# Patient Record
Sex: Male | Born: 1940 | Race: White | Hispanic: No | Marital: Married | Smoking: Former smoker
Health system: Southern US, Community
[De-identification: ages and names within clinical notes are randomized; demographics above are authoritative.]

## PROBLEM LIST (undated history)

## (undated) ENCOUNTER — Emergency Department (HOSPITAL_COMMUNITY): Payer: Medicare Other

## (undated) DIAGNOSIS — D62 Acute posthemorrhagic anemia: Secondary | ICD-10-CM

## (undated) DIAGNOSIS — C3490 Malignant neoplasm of unspecified part of unspecified bronchus or lung: Secondary | ICD-10-CM

## (undated) DIAGNOSIS — N183 Chronic kidney disease, stage 3 unspecified: Secondary | ICD-10-CM

## (undated) DIAGNOSIS — E119 Type 2 diabetes mellitus without complications: Secondary | ICD-10-CM

## (undated) DIAGNOSIS — I499 Cardiac arrhythmia, unspecified: Secondary | ICD-10-CM

## (undated) DIAGNOSIS — E876 Hypokalemia: Secondary | ICD-10-CM

## (undated) DIAGNOSIS — J449 Chronic obstructive pulmonary disease, unspecified: Secondary | ICD-10-CM

## (undated) DIAGNOSIS — F419 Anxiety disorder, unspecified: Secondary | ICD-10-CM

## (undated) DIAGNOSIS — R338 Other retention of urine: Secondary | ICD-10-CM

## (undated) DIAGNOSIS — I4891 Unspecified atrial fibrillation: Secondary | ICD-10-CM

## (undated) DIAGNOSIS — R0600 Dyspnea, unspecified: Secondary | ICD-10-CM

## (undated) DIAGNOSIS — Z89432 Acquired absence of left foot: Secondary | ICD-10-CM

## (undated) DIAGNOSIS — M869 Osteomyelitis, unspecified: Secondary | ICD-10-CM

## (undated) DIAGNOSIS — R000 Tachycardia, unspecified: Secondary | ICD-10-CM

## (undated) DIAGNOSIS — Z8719 Personal history of other diseases of the digestive system: Secondary | ICD-10-CM

## (undated) DIAGNOSIS — I10 Essential (primary) hypertension: Secondary | ICD-10-CM

## (undated) DIAGNOSIS — Z87891 Personal history of nicotine dependence: Secondary | ICD-10-CM

## (undated) DIAGNOSIS — I509 Heart failure, unspecified: Secondary | ICD-10-CM

## (undated) DIAGNOSIS — I480 Paroxysmal atrial fibrillation: Secondary | ICD-10-CM

## (undated) DIAGNOSIS — I639 Cerebral infarction, unspecified: Secondary | ICD-10-CM

## (undated) DIAGNOSIS — N401 Enlarged prostate with lower urinary tract symptoms: Secondary | ICD-10-CM

## (undated) DIAGNOSIS — A419 Sepsis, unspecified organism: Secondary | ICD-10-CM

## (undated) DIAGNOSIS — S62109A Fracture of unspecified carpal bone, unspecified wrist, initial encounter for closed fracture: Secondary | ICD-10-CM

## (undated) DIAGNOSIS — D72829 Elevated white blood cell count, unspecified: Secondary | ICD-10-CM

## (undated) DIAGNOSIS — K429 Umbilical hernia without obstruction or gangrene: Secondary | ICD-10-CM

## (undated) HISTORY — DX: Unspecified atrial fibrillation: I48.91

## (undated) HISTORY — DX: Acquired absence of left foot: Z89.432

## (undated) HISTORY — DX: Cerebral infarction, unspecified: I63.9

## (undated) HISTORY — DX: Hypokalemia: E87.6

## (undated) HISTORY — DX: Paroxysmal atrial fibrillation: I48.0

## (undated) HISTORY — DX: Personal history of other diseases of the digestive system: Z87.19

## (undated) HISTORY — DX: Personal history of nicotine dependence: Z87.891

## (undated) HISTORY — DX: Chronic kidney disease, stage 3 (moderate): N18.3

## (undated) HISTORY — DX: Type 2 diabetes mellitus without complications: E11.9

## (undated) HISTORY — DX: Acute posthemorrhagic anemia: D62

## (undated) HISTORY — DX: Chronic kidney disease, stage 3 unspecified: N18.30

## (undated) HISTORY — DX: Other retention of urine: R33.8

## (undated) HISTORY — DX: Fracture of unspecified carpal bone, unspecified wrist, initial encounter for closed fracture: S62.109A

## (undated) HISTORY — DX: Sepsis, unspecified organism: A41.9

## (undated) HISTORY — DX: Tachycardia, unspecified: R00.0

## (undated) HISTORY — DX: Essential (primary) hypertension: I10

## (undated) HISTORY — DX: Benign prostatic hyperplasia with lower urinary tract symptoms: N40.1

## (undated) HISTORY — DX: Osteomyelitis, unspecified: M86.9

## (undated) HISTORY — DX: Elevated white blood cell count, unspecified: D72.829

---

## 1991-09-28 DIAGNOSIS — Z8719 Personal history of other diseases of the digestive system: Secondary | ICD-10-CM

## 1991-09-28 HISTORY — DX: Personal history of other diseases of the digestive system: Z87.19

## 2002-05-14 ENCOUNTER — Ambulatory Visit (HOSPITAL_COMMUNITY): Admission: RE | Admit: 2002-05-14 | Discharge: 2002-05-14 | Payer: Self-pay | Admitting: Gastroenterology

## 2004-07-07 ENCOUNTER — Ambulatory Visit (HOSPITAL_COMMUNITY): Admission: RE | Admit: 2004-07-07 | Discharge: 2004-07-07 | Payer: Self-pay | Admitting: Gastroenterology

## 2010-10-13 ENCOUNTER — Other Ambulatory Visit: Payer: Self-pay | Admitting: Gastroenterology

## 2011-11-09 DIAGNOSIS — E7800 Pure hypercholesterolemia, unspecified: Secondary | ICD-10-CM | POA: Diagnosis not present

## 2011-11-09 DIAGNOSIS — K5100 Ulcerative (chronic) pancolitis without complications: Secondary | ICD-10-CM | POA: Diagnosis not present

## 2011-11-09 DIAGNOSIS — F3289 Other specified depressive episodes: Secondary | ICD-10-CM | POA: Diagnosis not present

## 2011-11-09 DIAGNOSIS — E119 Type 2 diabetes mellitus without complications: Secondary | ICD-10-CM | POA: Diagnosis not present

## 2011-11-09 DIAGNOSIS — I10 Essential (primary) hypertension: Secondary | ICD-10-CM | POA: Diagnosis not present

## 2011-11-09 DIAGNOSIS — F329 Major depressive disorder, single episode, unspecified: Secondary | ICD-10-CM | POA: Diagnosis not present

## 2011-12-01 DIAGNOSIS — L570 Actinic keratosis: Secondary | ICD-10-CM | POA: Diagnosis not present

## 2012-05-08 DIAGNOSIS — F329 Major depressive disorder, single episode, unspecified: Secondary | ICD-10-CM | POA: Diagnosis not present

## 2012-05-08 DIAGNOSIS — IMO0001 Reserved for inherently not codable concepts without codable children: Secondary | ICD-10-CM | POA: Diagnosis not present

## 2012-05-08 DIAGNOSIS — Z23 Encounter for immunization: Secondary | ICD-10-CM | POA: Diagnosis not present

## 2012-05-08 DIAGNOSIS — F3289 Other specified depressive episodes: Secondary | ICD-10-CM | POA: Diagnosis not present

## 2012-05-08 DIAGNOSIS — I10 Essential (primary) hypertension: Secondary | ICD-10-CM | POA: Diagnosis not present

## 2012-05-08 DIAGNOSIS — E7800 Pure hypercholesterolemia, unspecified: Secondary | ICD-10-CM | POA: Diagnosis not present

## 2012-05-08 DIAGNOSIS — K5100 Ulcerative (chronic) pancolitis without complications: Secondary | ICD-10-CM | POA: Diagnosis not present

## 2012-07-17 DIAGNOSIS — M25529 Pain in unspecified elbow: Secondary | ICD-10-CM | POA: Diagnosis not present

## 2012-11-08 DIAGNOSIS — Z125 Encounter for screening for malignant neoplasm of prostate: Secondary | ICD-10-CM | POA: Diagnosis not present

## 2012-11-08 DIAGNOSIS — K5100 Ulcerative (chronic) pancolitis without complications: Secondary | ICD-10-CM | POA: Diagnosis not present

## 2012-11-08 DIAGNOSIS — E119 Type 2 diabetes mellitus without complications: Secondary | ICD-10-CM | POA: Diagnosis not present

## 2012-11-08 DIAGNOSIS — F329 Major depressive disorder, single episode, unspecified: Secondary | ICD-10-CM | POA: Diagnosis not present

## 2012-11-08 DIAGNOSIS — F3289 Other specified depressive episodes: Secondary | ICD-10-CM | POA: Diagnosis not present

## 2012-11-08 DIAGNOSIS — I10 Essential (primary) hypertension: Secondary | ICD-10-CM | POA: Diagnosis not present

## 2012-11-08 DIAGNOSIS — E7800 Pure hypercholesterolemia, unspecified: Secondary | ICD-10-CM | POA: Diagnosis not present

## 2012-12-19 ENCOUNTER — Emergency Department (INDEPENDENT_AMBULATORY_CARE_PROVIDER_SITE_OTHER): Payer: Medicare Other

## 2012-12-19 ENCOUNTER — Encounter: Payer: Self-pay | Admitting: Emergency Medicine

## 2012-12-19 ENCOUNTER — Ambulatory Visit (INDEPENDENT_AMBULATORY_CARE_PROVIDER_SITE_OTHER): Payer: Medicare Other | Admitting: Sports Medicine

## 2012-12-19 ENCOUNTER — Emergency Department (INDEPENDENT_AMBULATORY_CARE_PROVIDER_SITE_OTHER)
Admission: EM | Admit: 2012-12-19 | Discharge: 2012-12-19 | Disposition: A | Discharge status: Home / Self Care | Payer: Medicare Other | Source: Home / Self Care | Attending: Family Medicine | Admitting: Family Medicine

## 2012-12-19 DIAGNOSIS — S52599A Other fractures of lower end of unspecified radius, initial encounter for closed fracture: Secondary | ICD-10-CM

## 2012-12-19 DIAGNOSIS — S52502A Unspecified fracture of the lower end of left radius, initial encounter for closed fracture: Secondary | ICD-10-CM | POA: Insufficient documentation

## 2012-12-19 DIAGNOSIS — W19XXXA Unspecified fall, initial encounter: Secondary | ICD-10-CM

## 2012-12-19 HISTORY — DX: Essential (primary) hypertension: I10

## 2012-12-19 MED ORDER — HYDROCODONE-ACETAMINOPHEN 5-325 MG PO TABS: 1.0000 | ORAL_TABLET | Freq: Three times a day (TID) | ORAL | Status: DC | PRN

## 2012-12-19 NOTE — ED Notes (Signed)
Left hand injury from fall 4 nights ago, tripped on blanket fell on hand on hardwood floor, hurts with movement, red, swollen, bruised

## 2012-12-19 NOTE — Progress Notes (Signed)
  Subjective:    I'm seeing this patient as a consultation for:  Dr. Alvester Morin  CC: Left wrist pain  HPI: This pleasant 72 year old male had a fall 4 days ago, fell onto an outstretched left hand. Had immediate pain, swelling, bruising, but did not seek medical attention. The bruising and pain persisted and he presented to urgent care. He was localized over the dorsal distal radius, is severe, and worse with any movement. X-rays were done that showed an impacted distal radius fracture that is nondisplaced and really non-angulated. I was called for definitive management.  Past medical history, Surgical history, Family history not pertinant except as noted below, Social history, Allergies, and medications have been entered into the medical record, reviewed, and no changes needed.   Review of Systems: No headache, visual changes, nausea, vomiting, diarrhea, constipation, dizziness, abdominal pain, skin rash, fevers, chills, night sweats, weight loss, swollen lymph nodes, body aches, joint swelling, muscle aches, chest pain, shortness of breath, mood changes, visual or auditory hallucinations.   Objective:   General: Well Developed, well nourished, and in no acute distress.  Neuro/Psych: Alert and oriented x3, extra-ocular muscles intact, able to move all 4 extremities, sensation grossly intact. Skin: Warm and dry, no rashes noted.  Respiratory: Not using accessory muscles, speaking in full sentences, trachea midline.  Cardiovascular: Pulses palpable, no extremity edema. Abdomen: Does not appear distended. Left wrist is swollen, minimally bruised, tender to palpation of the dorsal distal radius. He has good movement of his fingers and good grip as well as neurovascularly intact distally.  X-rays were reviewed and show an impacted distal radius fracture with adequate radial height, inclination, and angulation.  Sugar tong splint placed.  Impression and Recommendations:   This case required medical  decision making of moderate complexity.

## 2012-12-19 NOTE — Assessment & Plan Note (Addendum)
Sugar tong splint. Hydrocodone. Return in one week for cast.  I billed a fracture code for this visit, all subsequent visits for this complaint will be "post-op checks" in the global period.

## 2012-12-19 NOTE — ED Provider Notes (Signed)
History     CSN: [id number]  Arrival date & time 12/19/12  1331   None     Chief Complaint  Patient presents with  . Hand Injury   HPI  L hand injury 4 days ago  Pt was in a hotel, accidental fell, landing on L wrist.  Has had L wrist pain and swelling since this point.  No numbness.  Decreased ROM 2/2 pain.   No past medical history on file.  No past surgical history on file.  No family history on file.  History  Substance Use Topics  . Smoking status: Not on file  . Smokeless tobacco: Not on file  . Alcohol Use: Not on file      Review of Systems  All other systems reviewed and are negative.    Allergies  Review of patient's allergies indicates no known allergies.  Home Medications  No current outpatient prescriptions on file.  BP 182/96  Pulse 76  Temp(Src) 98 F (36.7 C) (Oral)  Ht 6\' 1"  (1.854 m)  Wt 247 lb (112.038 kg)  BMI 32.59 kg/m2  SpO2 94%  Physical Exam  Constitutional: He appears well-developed and well-nourished.  HENT:  Head: Normocephalic and atraumatic.  Eyes: Pupils are equal, round, and reactive to light.  Neck: Normal range of motion.  Cardiovascular: Normal rate and regular rhythm.   Pulmonary/Chest: Effort normal.  Abdominal: Soft.  Musculoskeletal:       Hands: + TTP and swelling diffusely  General decreased ROM 2/2 pain  Neurovascularly intact distally   Neurological: He is alert.  Skin: Skin is warm.    ED Course  Procedures (including critical care time)  Labs Reviewed - No data to display Dg Wrist Complete Left  12/19/2012  *RADIOLOGY REPORT*  Clinical Data: Post fall  LEFT WRIST - COMPLETE 3+ VIEW  Comparison: None.  Findings: Four views of the left wrist submitted.  There is mild displaced impacted fracture of distal left radius.  Fracture line is seen progressing to distal articular surface of the radius.  IMPRESSION: Mild displaced impacted fracture of distal left radius.   Original Report Authenticated By:  Natasha Mead, M.D.    Dg Hand Complete Left  12/19/2012  *RADIOLOGY REPORT*  Clinical Data: Hand injury post fall  LEFT HAND - COMPLETE 3+ VIEW  Comparison: None.  Findings: Three views of the left hand submitted.  No hand fracture.  There is impacted mild displaced fracture of distal left radius.  IMPRESSION: No hand fracture.  Impacted mild displaced fracture distal left radius.   Original Report Authenticated By: Natasha Mead, M.D.      1. Distal radius fracture, left, closed, initial encounter       MDM  Impacted distal radius fracture.  Will need sugartong splint and sports medicine follow up.  Sports medicine consulted as they are available.  Treatment and follow up per sports medicine.     The patient and/or caregiver has been counseled thoroughly with regard to treatment plan and/or medications prescribed including dosage, schedule, interactions, rationale for use, and possible side effects and they verbalize understanding. Diagnoses and expected course of recovery discussed and will return if not improved as expected or if the condition worsens. Patient and/or caregiver verbalized understanding.             Doree Albee, MD 12/19/12 [phone]

## 2012-12-26 ENCOUNTER — Encounter: Payer: Self-pay | Admitting: Sports Medicine

## 2012-12-26 ENCOUNTER — Ambulatory Visit (INDEPENDENT_AMBULATORY_CARE_PROVIDER_SITE_OTHER): Payer: Medicare Other

## 2012-12-26 ENCOUNTER — Ambulatory Visit (INDEPENDENT_AMBULATORY_CARE_PROVIDER_SITE_OTHER): Payer: Medicare Other | Admitting: Sports Medicine

## 2012-12-26 ENCOUNTER — Other Ambulatory Visit: Payer: Self-pay | Admitting: Sports Medicine

## 2012-12-26 ENCOUNTER — Ambulatory Visit (HOSPITAL_COMMUNITY): Admission: RE | Admit: 2012-12-26 | Payer: Medicare Other | Source: Ambulatory Visit

## 2012-12-26 VITALS — BP 156/87 | HR 65

## 2012-12-26 DIAGNOSIS — S52502D Unspecified fracture of the lower end of left radius, subsequent encounter for closed fracture with routine healing: Secondary | ICD-10-CM

## 2012-12-26 DIAGNOSIS — S52599A Other fractures of lower end of unspecified radius, initial encounter for closed fracture: Secondary | ICD-10-CM

## 2012-12-26 DIAGNOSIS — S5290XD Unspecified fracture of unspecified forearm, subsequent encounter for closed fracture with routine healing: Secondary | ICD-10-CM

## 2012-12-26 NOTE — Assessment & Plan Note (Signed)
On the current view, there is about 2 mm of joint line incongruity. We did discuss the risk of osteoarthritis of the wrist down the road. Maysin desires not to proceed with any form of operative intervention. Short arm cast placed. He will return in 2 weeks, x-ray before the visit.

## 2012-12-26 NOTE — Progress Notes (Signed)
  Subjective: Ison is now 9 days status post distal radius fracture, he did not seek medical care until 5 days ago. The fracture was mildly comminuted and intra-articular. I placed a sugar tong splint at the last visit and he returns today pain-free.   Objective: General: Well-developed, well-nourished, and in no acute distress. Swelling is significantly resolved, there is still mild tenderness to palpation over the distal radius. He is neurovascularly intact distally.  X-rays were reviewed and continued to show intra-articular distal radius fracture with approximately 2 mm of joint incongruity.  Short arm cast placed. Assessment/plan:

## 2012-12-27 ENCOUNTER — Telehealth: Payer: Self-pay | Admitting: Sports Medicine

## 2012-12-27 DIAGNOSIS — S52502D Unspecified fracture of the lower end of left radius, subsequent encounter for closed fracture with routine healing: Secondary | ICD-10-CM

## 2012-12-27 NOTE — Telephone Encounter (Signed)
Patient called set up an appointment for 01/09/13 for f/up on fractured wrist and req to have x-ray order sent downstairs.Marland KitchenMarland KitchenMarland KitchenThanks

## 2012-12-27 NOTE — Telephone Encounter (Signed)
Orders placed.

## 2013-01-09 ENCOUNTER — Ambulatory Visit (INDEPENDENT_AMBULATORY_CARE_PROVIDER_SITE_OTHER): Payer: Medicare Other

## 2013-01-09 ENCOUNTER — Encounter: Payer: Self-pay | Admitting: Sports Medicine

## 2013-01-09 ENCOUNTER — Ambulatory Visit (INDEPENDENT_AMBULATORY_CARE_PROVIDER_SITE_OTHER): Payer: Medicare Other | Admitting: Sports Medicine

## 2013-01-09 VITALS — BP 136/79 | HR 67

## 2013-01-09 DIAGNOSIS — S52502D Unspecified fracture of the lower end of left radius, subsequent encounter for closed fracture with routine healing: Secondary | ICD-10-CM

## 2013-01-09 DIAGNOSIS — S52599A Other fractures of lower end of unspecified radius, initial encounter for closed fracture: Secondary | ICD-10-CM

## 2013-01-09 DIAGNOSIS — IMO0001 Reserved for inherently not codable concepts without codable children: Secondary | ICD-10-CM

## 2013-01-09 DIAGNOSIS — S5290XA Unspecified fracture of unspecified forearm, initial encounter for closed fracture: Secondary | ICD-10-CM | POA: Diagnosis not present

## 2013-01-09 NOTE — Assessment & Plan Note (Signed)
Short arm cast replaced. Return in 3 weeks. No x-rays needed.

## 2013-01-09 NOTE — Progress Notes (Signed)
  Subjective: Kevin [name] is now 3 and half weeks status post fracture of the distal radius with intra-articular extension. He is essentially pain free.   Objective: General: Well-developed, well-nourished, and in no acute distress. Cast is loose and was removed. Is very little if any tenderness to palpation over the fracture site.  Short arm cast replaced.  X-rays were reviewed and show stable alignment of the distal radius intra-articular fracture. Assessment/plan:

## 2013-01-30 ENCOUNTER — Ambulatory Visit: Payer: Medicare Other | Admitting: Sports Medicine

## 2013-01-30 ENCOUNTER — Encounter: Payer: Self-pay | Admitting: Sports Medicine

## 2013-01-30 VITALS — BP 129/71 | HR 60

## 2013-01-30 DIAGNOSIS — S52502D Unspecified fracture of the lower end of left radius, subsequent encounter for closed fracture with routine healing: Secondary | ICD-10-CM

## 2013-01-30 NOTE — Assessment & Plan Note (Signed)
Velcro wrist brace. Home exercises to increase range of motion. Return in 4 weeks to recheck.

## 2013-01-30 NOTE — Progress Notes (Signed)
  Subjective: This pleasant 72 year old male is 6 weeks status post fracture of the left distal radius that extended into the joint space. He is pain-free.   Objective: General: Well-developed, well-nourished, and in no acute distress.  His cast is removed. There is no tenderness to palpation over the fracture site. He still has a little bit of pain with extreme supination. Otherwise range of motion is somewhat limited as expected.   Assessment/plan:

## 2013-02-01 DIAGNOSIS — D235 Other benign neoplasm of skin of trunk: Secondary | ICD-10-CM | POA: Diagnosis not present

## 2013-02-01 DIAGNOSIS — Z85828 Personal history of other malignant neoplasm of skin: Secondary | ICD-10-CM | POA: Diagnosis not present

## 2013-03-06 ENCOUNTER — Ambulatory Visit: Payer: Medicare Other | Admitting: Sports Medicine

## 2013-03-06 ENCOUNTER — Encounter: Payer: Self-pay | Admitting: Sports Medicine

## 2013-03-06 VITALS — BP 123/70 | HR 71

## 2013-03-06 DIAGNOSIS — S52502S Unspecified fracture of the lower end of left radius, sequela: Secondary | ICD-10-CM

## 2013-03-06 NOTE — Assessment & Plan Note (Signed)
Saksham is now approximately 11 weeks status post a left distal radius fracture that extended into the joint. He continues to improve every day, and continue to work on strength with exercises. 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 I've advised him to continue the exercises, continue the wrist brace as needed. If still painful and 4-6 weeks I can certainly perform an intra-articular injection.  I will see him back in about 6 weeks.

## 2013-03-06 NOTE — Progress Notes (Signed)
  Subjective: Kevin [name] is now approximately 11 weeks status post a left distal radius fracture that extended into the joint. He continues to improve every day, and continue to work on strength with exercises. 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   Objective: General: Well-developed, well-nourished, and in no acute distress. Wrist is still somewhat swollen, with mild tenderness to palpation just distal to Lister's tubercle. He has reproduction of pain passive wrist extension. Otherwise good strength and range of motion.  Assessment/plan:

## 2013-04-17 ENCOUNTER — Ambulatory Visit: Payer: Medicare Other | Admitting: Sports Medicine

## 2013-05-09 DIAGNOSIS — F3289 Other specified depressive episodes: Secondary | ICD-10-CM | POA: Diagnosis not present

## 2013-05-09 DIAGNOSIS — E119 Type 2 diabetes mellitus without complications: Secondary | ICD-10-CM | POA: Diagnosis not present

## 2013-05-09 DIAGNOSIS — I10 Essential (primary) hypertension: Secondary | ICD-10-CM | POA: Diagnosis not present

## 2013-05-09 DIAGNOSIS — K5100 Ulcerative (chronic) pancolitis without complications: Secondary | ICD-10-CM | POA: Diagnosis not present

## 2013-05-09 DIAGNOSIS — E7800 Pure hypercholesterolemia, unspecified: Secondary | ICD-10-CM | POA: Diagnosis not present

## 2013-05-09 DIAGNOSIS — F329 Major depressive disorder, single episode, unspecified: Secondary | ICD-10-CM | POA: Diagnosis not present

## 2013-06-04 DIAGNOSIS — K5100 Ulcerative (chronic) pancolitis without complications: Secondary | ICD-10-CM | POA: Diagnosis not present

## 2013-11-09 DIAGNOSIS — E7800 Pure hypercholesterolemia, unspecified: Secondary | ICD-10-CM | POA: Diagnosis not present

## 2013-11-09 DIAGNOSIS — F329 Major depressive disorder, single episode, unspecified: Secondary | ICD-10-CM | POA: Diagnosis not present

## 2013-11-09 DIAGNOSIS — E119 Type 2 diabetes mellitus without complications: Secondary | ICD-10-CM | POA: Diagnosis not present

## 2013-11-09 DIAGNOSIS — I10 Essential (primary) hypertension: Secondary | ICD-10-CM | POA: Diagnosis not present

## 2013-11-09 DIAGNOSIS — Z1331 Encounter for screening for depression: Secondary | ICD-10-CM | POA: Diagnosis not present

## 2013-11-09 DIAGNOSIS — K5100 Ulcerative (chronic) pancolitis without complications: Secondary | ICD-10-CM | POA: Diagnosis not present

## 2013-11-29 ENCOUNTER — Other Ambulatory Visit: Payer: Self-pay | Admitting: Gastroenterology

## 2013-11-29 DIAGNOSIS — Z8719 Personal history of other diseases of the digestive system: Secondary | ICD-10-CM | POA: Diagnosis not present

## 2013-11-29 DIAGNOSIS — Z09 Encounter for follow-up examination after completed treatment for conditions other than malignant neoplasm: Secondary | ICD-10-CM | POA: Diagnosis not present

## 2013-11-29 DIAGNOSIS — K5190 Ulcerative colitis, unspecified, without complications: Secondary | ICD-10-CM | POA: Diagnosis not present

## 2013-11-29 DIAGNOSIS — D126 Benign neoplasm of colon, unspecified: Secondary | ICD-10-CM | POA: Diagnosis not present

## 2014-05-23 DIAGNOSIS — E119 Type 2 diabetes mellitus without complications: Secondary | ICD-10-CM | POA: Diagnosis not present

## 2014-05-23 DIAGNOSIS — F329 Major depressive disorder, single episode, unspecified: Secondary | ICD-10-CM | POA: Diagnosis not present

## 2014-05-23 DIAGNOSIS — I10 Essential (primary) hypertension: Secondary | ICD-10-CM | POA: Diagnosis not present

## 2014-05-23 DIAGNOSIS — K5100 Ulcerative (chronic) pancolitis without complications: Secondary | ICD-10-CM | POA: Diagnosis not present

## 2014-05-23 DIAGNOSIS — Z0184 Encounter for antibody response examination: Secondary | ICD-10-CM | POA: Diagnosis not present

## 2014-05-23 DIAGNOSIS — E7800 Pure hypercholesterolemia, unspecified: Secondary | ICD-10-CM | POA: Diagnosis not present

## 2014-10-20 ENCOUNTER — Emergency Department (INDEPENDENT_AMBULATORY_CARE_PROVIDER_SITE_OTHER): Payer: Medicare Other

## 2014-10-20 ENCOUNTER — Emergency Department (INDEPENDENT_AMBULATORY_CARE_PROVIDER_SITE_OTHER)
Admission: EM | Admit: 2014-10-20 | Discharge: 2014-10-20 | Disposition: A | Discharge status: Home / Self Care | Payer: Medicare Other | Source: Home / Self Care | Attending: Emergency Medicine | Admitting: Emergency Medicine

## 2014-10-20 DIAGNOSIS — R059 Cough, unspecified: Secondary | ICD-10-CM

## 2014-10-20 DIAGNOSIS — R05 Cough: Secondary | ICD-10-CM

## 2014-10-20 DIAGNOSIS — R0602 Shortness of breath: Secondary | ICD-10-CM | POA: Diagnosis not present

## 2014-10-20 DIAGNOSIS — J208 Acute bronchitis due to other specified organisms: Secondary | ICD-10-CM | POA: Diagnosis not present

## 2014-10-20 MED ORDER — AZITHROMYCIN 250 MG PO TABS: 250.0000 mg | ORAL_TABLET | Freq: Every day | ORAL | Status: DC

## 2014-10-20 MED ORDER — ALBUTEROL SULFATE HFA 108 (90 BASE) MCG/ACT IN AERS: 1.0000 | INHALATION_SPRAY | Freq: Four times a day (QID) | RESPIRATORY_TRACT | Status: DC | PRN

## 2014-10-20 NOTE — ED Provider Notes (Signed)
CSN: [id number]     Arrival date & time 10/20/14  1424 History   First MD Initiated Contact with Patient 10/20/14 1440     Chief Complaint  Patient presents with  . Cough   (Consider location/radiation/quality/duration/timing/severity/associated sxs/prior Treatment) Patient is a 74 y.o. male presenting with cough. The history is provided by the patient. No language interpreter was used.  Cough Cough characteristics:  Productive Sputum characteristics:  Nondescript Severity:  Moderate Onset quality:  Gradual Duration:  1 week Timing:  Constant Progression:  Worsening Chronicity:  New Smoker: no   Context: not animal exposure and not sick contacts   Relieved by:  Nothing Worsened by:  Nothing tried Ineffective treatments:  None tried Associated symptoms: shortness of breath   Associated symptoms: no fever     Past Medical History  Diagnosis Date  . Diabetes mellitus without complication   . Hypertension    No past surgical history on file. No family history on file. History  Substance Use Topics  . Smoking status: Never Smoker   . Smokeless tobacco: Not on file  . Alcohol Use: Yes    Review of Systems  Constitutional: Negative for fever.  Respiratory: Positive for cough and shortness of breath.   All other systems reviewed and are negative.   Allergies  Review of patient's allergies indicates no known allergies.  Home Medications   Prior to Admission medications   Medication Sig Start Date End Date Taking? Authorizing Provider  FLUOXETINE HCL PO Take by mouth.   Yes Historical Provider, MD  SitaGLIPtin Phosphate (JANUVIA PO) Take by mouth.   Yes Historical Provider, MD  metFORMIN (GLUCOPHAGE) 500 MG tablet Take 500 mg by mouth 2 (two) times daily with a meal.    Historical Provider, MD  metoprolol succinate (TOPROL-XL) 100 MG 24 hr tablet Take 100 mg by mouth daily. Take with or immediately following a meal.    Historical Provider, MD  olsalazine (DIPENTUM) 250  MG capsule Take 500 mg by mouth 2 (two) times daily.    Historical Provider, MD  rosuvastatin (CRESTOR) 10 MG tablet Take 10 mg by mouth daily.    Historical Provider, MD   BP 149/84 mmHg  Pulse 76  Temp(Src) 98.4 F (36.9 C) (Oral)  Ht 6\' 1"  (1.854 m)  Wt 240 lb (108.863 kg)  BMI 31.67 kg/m2  SpO2 92% Physical Exam  Constitutional: He is oriented to person, place, and time. He appears well-developed and well-nourished.  HENT:  Head: Normocephalic and atraumatic.  Right Ear: External ear normal.  Nose: Nose normal.  Mouth/Throat: Oropharynx is clear and moist.  Eyes: EOM are normal. Pupils are equal, round, and reactive to light.  Neck: Normal range of motion.  Cardiovascular: Normal rate.   Pulmonary/Chest: Effort normal and breath sounds normal.  Abdominal: Soft. He exhibits no distension.  Musculoskeletal: Normal range of motion.  Neurological: He is alert and oriented to person, place, and time.  Skin: Skin is warm.  Psychiatric: He has a normal mood and affect.  Nursing note and vitals reviewed.   ED Course  Procedures (including critical care time) Labs Review Labs Reviewed - No data to display  Imaging Review Dg Chest 2 View  10/20/2014   CLINICAL DATA:  Two weeks of cough and shortness of breath. Nonsmoker.  EXAM: CHEST  2 VIEW  COMPARISON:  None  FINDINGS: Cardiac silhouette normal in size and configuration. Aorta is mildly uncoiled. No mediastinal or hilar masses or convincing adenopathy.  Lungs are mildly  hyperexpanded. No lung consolidation or edema. No pleural effusion or pneumothorax.  Bony thorax is intact.  IMPRESSION: No acute cardiopulmonary disease.   Electronically Signed   By: Lajean Manes M.D.   On: 10/20/2014 15:12     MDM   1. Acute bronchitis due to other specified organisms   2. Cough     Zithromax Albuterol inhaler See your Physician for recheck in 3-4 days AVS   Fransico Meadow, PA-C 10/20/14 1522

## 2014-10-20 NOTE — Discharge Instructions (Signed)

## 2014-10-20 NOTE — ED Notes (Signed)
Patient has cough and cold sx, sore throat, congestion and shortness of breath. Patient has used Mucinex OTC with some relief.

## 2014-10-22 ENCOUNTER — Telehealth: Payer: Self-pay | Admitting: Emergency Medicine

## 2014-11-28 DIAGNOSIS — I10 Essential (primary) hypertension: Secondary | ICD-10-CM | POA: Diagnosis not present

## 2014-11-28 DIAGNOSIS — K5190 Ulcerative colitis, unspecified, without complications: Secondary | ICD-10-CM | POA: Diagnosis not present

## 2014-11-28 DIAGNOSIS — E780 Pure hypercholesterolemia: Secondary | ICD-10-CM | POA: Diagnosis not present

## 2014-11-28 DIAGNOSIS — E119 Type 2 diabetes mellitus without complications: Secondary | ICD-10-CM | POA: Diagnosis not present

## 2014-11-28 DIAGNOSIS — Z1389 Encounter for screening for other disorder: Secondary | ICD-10-CM | POA: Diagnosis not present

## 2014-11-28 DIAGNOSIS — F322 Major depressive disorder, single episode, severe without psychotic features: Secondary | ICD-10-CM | POA: Diagnosis not present

## 2014-11-28 DIAGNOSIS — Z23 Encounter for immunization: Secondary | ICD-10-CM | POA: Diagnosis not present

## 2015-03-06 DIAGNOSIS — E119 Type 2 diabetes mellitus without complications: Secondary | ICD-10-CM | POA: Diagnosis not present

## 2015-06-12 DIAGNOSIS — E780 Pure hypercholesterolemia: Secondary | ICD-10-CM | POA: Diagnosis not present

## 2015-06-12 DIAGNOSIS — Z23 Encounter for immunization: Secondary | ICD-10-CM | POA: Diagnosis not present

## 2015-06-12 DIAGNOSIS — I10 Essential (primary) hypertension: Secondary | ICD-10-CM | POA: Diagnosis not present

## 2015-06-12 DIAGNOSIS — F322 Major depressive disorder, single episode, severe without psychotic features: Secondary | ICD-10-CM | POA: Diagnosis not present

## 2015-06-12 DIAGNOSIS — E119 Type 2 diabetes mellitus without complications: Secondary | ICD-10-CM | POA: Diagnosis not present

## 2015-06-12 DIAGNOSIS — K5190 Ulcerative colitis, unspecified, without complications: Secondary | ICD-10-CM | POA: Diagnosis not present

## 2015-11-18 ENCOUNTER — Encounter: Payer: Self-pay | Admitting: *Deleted

## 2015-11-18 ENCOUNTER — Emergency Department (INDEPENDENT_AMBULATORY_CARE_PROVIDER_SITE_OTHER)
Admission: EM | Admit: 2015-11-18 | Discharge: 2015-11-18 | Disposition: A | Discharge status: Home / Self Care | Payer: Medicare Other | Source: Home / Self Care | Attending: Family Medicine | Admitting: Family Medicine

## 2015-11-18 ENCOUNTER — Emergency Department (INDEPENDENT_AMBULATORY_CARE_PROVIDER_SITE_OTHER): Payer: Medicare Other

## 2015-11-18 ENCOUNTER — Institutional Professional Consult (permissible substitution): Payer: Medicare Other | Admitting: Sports Medicine

## 2015-11-18 DIAGNOSIS — M25572 Pain in left ankle and joints of left foot: Secondary | ICD-10-CM | POA: Diagnosis not present

## 2015-11-18 DIAGNOSIS — M25475 Effusion, left foot: Secondary | ICD-10-CM

## 2015-11-18 DIAGNOSIS — M7989 Other specified soft tissue disorders: Secondary | ICD-10-CM | POA: Diagnosis not present

## 2015-11-18 DIAGNOSIS — M19072 Primary osteoarthritis, left ankle and foot: Secondary | ICD-10-CM

## 2015-11-18 DIAGNOSIS — M79672 Pain in left foot: Secondary | ICD-10-CM

## 2015-11-18 NOTE — Consult Note (Signed)
   Subjective:    I'm seeing this patient as a consultation for:  Noland Fordyce PA-C  CC: Right foot swelling  HPI: For the past couple of weeks this pleasant 75 year old male has had increasing swelling over the dorsal medial midfoot, with minimal pain, no redness, no constitutional symptoms and no history of trauma. Symptoms are mild, persistent.  Past medical history, Surgical history, Family history not pertinant except as noted below, Social history, Allergies, and medications have been entered into the medical record, reviewed, and no changes needed.   Review of Systems: No headache, visual changes, nausea, vomiting, diarrhea, constipation, dizziness, abdominal pain, skin rash, fevers, chills, night sweats, weight loss, swollen lymph nodes, body aches, joint swelling, muscle aches, chest pain, shortness of breath, mood changes, visual or auditory hallucinations.   Objective:   General: Well Developed, well nourished, and in no acute distress.  Neuro/Psych: Alert and oriented x3, extra-ocular muscles intact, able to move all 4 extremities, sensation grossly intact. Skin: Warm and dry, no rashes noted.  Respiratory: Not using accessory muscles, speaking in full sentences, trachea midline.  Cardiovascular: Pulses palpable, no extremity edema. Abdomen: Does not appear distended. Left foot: Visibly swollen with tenderness over the tarsometatarsal joint of the first ray. No erythema, induration, no warmth, good dorsalis pedis and posterior tibial pulses as well as good sensation.  X-rays reviewed and shows severe ankle arthritis, severe tarsometatarsal arthritis of the first ray, as well as evidence of an old fracture of the fifth metatarsal.  Procedure: Real-time Ultrasound Guided Injection of left first metatarsal/medial cuneiform joint Device: GE Logiq E  Verbal informed consent obtained.  Time-out conducted.  Noted no overlying erythema, induration, or other signs of local infection.   Skin prepped in a sterile fashion.  Local anesthesia: Topical Ethyl chloride.  With sterile technique and under real time ultrasound guidance:  Noted effusion, mild, as well as synovitis, 22-gauge needle advanced into this joint and 1 mL lidocaine, 1 mL kenalog 40 injected easily. Completed without difficulty  Pain immediately resolved suggesting accurate placement of the medication.  Advised to call if fevers/chills, erythema, induration, drainage, or persistent bleeding.  Images permanently stored and available for review in the ultrasound unit.  Impression: Technically successful ultrasound guided injection.  Impression and Recommendations:   This case required medical decision making of moderate complexity.  1. Primary foot osteoarthritis: Injection as above, return to see me for custom orthotics. ___________________________________________ Gwen Her. Dianah Field, M.D., ABFM., CAQSM. Primary Care and [place] In[employer] of [employer]

## 2015-11-18 NOTE — ED Notes (Signed)
Pt c/o left foot pain and swelling without injury x 1 week. No previous injuries. Walks on a treadmill 5 days/week.

## 2015-11-18 NOTE — ED Provider Notes (Signed)
CSN: [id number]     Arrival date & time 11/18/15  1404 History   First MD Initiated Contact with Patient 11/18/15 1419     Chief Complaint  Patient presents with  . Foot Pain   (Consider location/radiation/quality/duration/timing/severity/associated sxs/prior Treatment) HPI The pt is a 75yo male presenting to [facility] with c/o gradually worsening Left foot pain and swelling that started about 1 week ago. No known injury.  He states he does walk on a treadmill 5 times a week for about 35 minutes at a time.  He took a day other this weekend and when he tried to walk the next day he had to stop sooner than usual as the pain was significantly worse by the time he was doing his cool down part of the workout.  He also reports wearing shoes the other day that may have been pushing on the top of his foot but cannot think of anything else that may have cause his pain. Denies calf pain or swelling. Denies hx of gout.   Past Medical History  Diagnosis Date  . Diabetes mellitus without complication ([place])   . Hypertension    Past Surgical History  Procedure Laterality Date  . Wrist surgery     History reviewed. No pertinent family history. Social History  Substance Use Topics  . Smoking status: Never Smoker   . Smokeless tobacco: None  . Alcohol Use: Yes    Review of Systems  Musculoskeletal: Positive for myalgias, joint swelling and arthralgias.       Left foot  Skin: Negative for color change and wound.  Neurological: Negative for weakness and numbness.    Allergies  Review of patient's allergies indicates no known allergies.  Home Medications   Prior to Admission medications   Medication Sig Start Date End Date Taking? Authorizing Provider  FLUOXETINE HCL PO Take by mouth.   Yes Historical Provider, MD  mesalamine (APRISO) 0.375 g 24 hr capsule Take 375 mg by mouth daily.   Yes Historical Provider, MD  metoprolol succinate (TOPROL-XL) 100 MG 24 hr tablet Take 100 mg by mouth daily. Take  with or immediately following a meal.   Yes Historical Provider, MD  SitaGLIPtin Phosphate (JANUVIA PO) Take by mouth.   Yes Historical Provider, MD  metFORMIN (GLUCOPHAGE) 500 MG tablet Take 500 mg by mouth 2 (two) times daily with a meal.    Historical Provider, MD  olsalazine (DIPENTUM) 250 MG capsule Take 500 mg by mouth 2 (two) times daily.    Historical Provider, MD  rosuvastatin (CRESTOR) 10 MG tablet Take 10 mg by mouth daily.    Historical Provider, MD   Meds Ordered and Administered this Visit  Medications - No data to display  BP 132/74 mmHg  Pulse 66  Resp 14  Wt 237 lb (107.502 kg)  SpO2 95% No data found.   Physical Exam  Constitutional: He is oriented to person, place, and time. He appears well-developed and well-nourished.  HENT:  Head: Normocephalic and atraumatic.  Eyes: EOM are normal.  Neck: Normal range of motion.  Cardiovascular: Normal rate.   Pulses:      Dorsalis pedis pulses are 2+ on the left side.  Pulmonary/Chest: Effort normal.  Musculoskeletal: Normal range of motion. He exhibits edema and tenderness.  Left foot: moderate edema, worse on medial dorsal aspect of foot with tenderness. Full ROM ankle and toes.  Calf- soft, non-tender.  Neurological: He is alert and oriented to person, place, and time.  Left foot:  normal sensation  Skin: Skin is warm and dry. No rash noted. No erythema.  Left foot: skin in tact, no ecchymosis or erythema.   Psychiatric: He has a normal mood and affect. His behavior is normal.  Nursing note and vitals reviewed.   ED Course  Procedures (including critical care time)  Labs Review Labs Reviewed - No data to display  Imaging Review Dg Foot Complete Left  11/18/2015  CLINICAL DATA:  Left medial foot pain with redness for 6 days EXAM: LEFT FOOT - COMPLETE 3+ VIEW COMPARISON:  None. FINDINGS: Deformity fifth metatarsal suggests prior fracture. Moderate soft tissue swelling along the medial midfoot. There is arthritic  change at the first tarsometatarsal joint with osteophyte formation. No evidence of cortical destruction or periosteal reaction. IMPRESSION: Chronic degenerative changes. Nonspecific soft tissue swelling over the medial midfoot. Etiology uncertain with possible causes including cellulitis. Electronically Signed   By: Skipper Cliche M.D.   On: 11/18/2015 14:46     MDM   1. Left foot pain   2. Swelling of foot joint, left   3. Primary osteoarthritis of left foot     Pt c/o Left foot pain and swelling for about 1 week. No known injury. No evidence of underlying infection. Denies hx of gout. No hx of blood clots. Calf is soft, non-tender.  Plain films: chronic degenerative changes. Non-specific soft tissue swelling over medial midfoot. Etiology uncertain with possible causes including cellulitis.  Consulted with Dr. Dianah Field, Sports Medicine, who also examined and ultimately treated pt.  See consult note.       Noland Fordyce, PA-C 11/18/15 1552

## 2015-11-21 ENCOUNTER — Encounter: Payer: Self-pay | Admitting: Sports Medicine

## 2015-11-21 ENCOUNTER — Ambulatory Visit (INDEPENDENT_AMBULATORY_CARE_PROVIDER_SITE_OTHER): Payer: Medicare Other | Admitting: Sports Medicine

## 2015-11-21 VITALS — BP 150/77 | HR 70 | Resp 16 | Wt 236.2 lb

## 2015-11-21 DIAGNOSIS — M19072 Primary osteoarthritis, left ankle and foot: Secondary | ICD-10-CM

## 2015-11-21 NOTE — Assessment & Plan Note (Signed)
Intertarsal injection approximately 3 days ago with fantastic resolution of pain and swelling. 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 Custom orthotics as above, we had great difficulty molding the midfoot arch on the left foot. Return in one month.

## 2015-11-21 NOTE — Progress Notes (Signed)

## 2015-12-10 DIAGNOSIS — Z7984 Long term (current) use of oral hypoglycemic drugs: Secondary | ICD-10-CM | POA: Diagnosis not present

## 2015-12-10 DIAGNOSIS — E119 Type 2 diabetes mellitus without complications: Secondary | ICD-10-CM | POA: Diagnosis not present

## 2015-12-10 DIAGNOSIS — I10 Essential (primary) hypertension: Secondary | ICD-10-CM | POA: Diagnosis not present

## 2015-12-10 DIAGNOSIS — K5190 Ulcerative colitis, unspecified, without complications: Secondary | ICD-10-CM | POA: Diagnosis not present

## 2015-12-10 DIAGNOSIS — Z1389 Encounter for screening for other disorder: Secondary | ICD-10-CM | POA: Diagnosis not present

## 2015-12-10 DIAGNOSIS — E7800 Pure hypercholesterolemia, unspecified: Secondary | ICD-10-CM | POA: Diagnosis not present

## 2015-12-10 DIAGNOSIS — F322 Major depressive disorder, single episode, severe without psychotic features: Secondary | ICD-10-CM | POA: Diagnosis not present

## 2015-12-19 ENCOUNTER — Encounter: Payer: Self-pay | Admitting: Sports Medicine

## 2015-12-19 ENCOUNTER — Ambulatory Visit (INDEPENDENT_AMBULATORY_CARE_PROVIDER_SITE_OTHER): Payer: Medicare Other | Admitting: Sports Medicine

## 2015-12-19 VITALS — BP 139/77 | HR 62 | Resp 18 | Wt 233.9 lb

## 2015-12-19 DIAGNOSIS — S86812A Strain of other muscle(s) and tendon(s) at lower leg level, left leg, initial encounter: Secondary | ICD-10-CM | POA: Insufficient documentation

## 2015-12-19 DIAGNOSIS — S86812S Strain of other muscle(s) and tendon(s) at lower leg level, left leg, sequela: Secondary | ICD-10-CM

## 2015-12-19 NOTE — Assessment & Plan Note (Signed)
Patient notes a palpable nodule on the anterior ankle, he also has subjective and relative weakness of dorsiflexion of the left ankle. 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 X-rays have been negative, we are going to obtain an MRI for definitive diagnosis however I don't think he needs surgical intervention, there is no limitation in activities of daily living with the exception of occasional slapping of his feet with ambulation.

## 2015-12-19 NOTE — Progress Notes (Signed)
  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  Nodule on anterior ankle: Present for decades, no pain, slight weakness to dorsiflexion and a bit of slapping gait. Remembers distant trauma. Really doesn't affect his activities of daily living or gait.  Past medical history, Surgical history, Family history not pertinant except as noted below, Social history, Allergies, and medications have been entered into the medical record, reviewed, and no changes needed.   Review of Systems: No fevers, chills, night sweats, weight loss, chest pain, or shortness of breath.   Objective:    General: Well Developed, well nourished, and in no acute distress.  Neuro: Alert and oriented x3, extra-ocular muscles intact, sensation grossly intact.  HEENT: Normocephalic, atraumatic, pupils equal round reactive to light, neck supple, no masses, no lymphadenopathy, thyroid nonpalpable.  Skin: Warm and dry, no rashes. Cardiac: Regular rate and rhythm, no murmurs rubs or gallops, no lower extremity edema.  Respiratory: Clear to auscultation bilaterally. Not using accessory muscles, speaking in full sentences. Left Ankle: No visible erythema or swelling. Range of motion is full in all directions. Strength is 5/5 in all directions. Stable lateral and medial ligaments; squeeze test and kleiger test unremarkable; Talar dome nontender; No pain at base of 5th MT; No tenderness over cuboid; No tenderness over N spot or navicular prominence No tenderness on posterior aspects of lateral and medial malleolus No sign of peroneal tendon subluxations; Negative tarsal tunnel tinel's Palpable nodule proximal to the talocrural joint, weak to dorsiflexion when compared to the contralateral side. Nodule is well-defined movable, nontender.  Procedure: Diagnostic Ultrasound of left ankle Device: GE Logiq E  Findings: There appears to be a rupture with retraction of the  tibialis anterior, I can see the end of the tendon with some surrounding hypoechoic change in fluid. Images permanently stored and available for review in the ultrasound unit.  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  Impression and Recommendations:

## 2015-12-29 ENCOUNTER — Ambulatory Visit (INDEPENDENT_AMBULATORY_CARE_PROVIDER_SITE_OTHER): Payer: Medicare Other

## 2015-12-29 DIAGNOSIS — M25572 Pain in left ankle and joints of left foot: Secondary | ICD-10-CM | POA: Diagnosis not present

## 2015-12-29 DIAGNOSIS — S96912A Strain of unspecified muscle and tendon at ankle and foot level, left foot, initial encounter: Secondary | ICD-10-CM | POA: Diagnosis not present

## 2015-12-29 DIAGNOSIS — X58XXXA Exposure to other specified factors, initial encounter: Secondary | ICD-10-CM

## 2015-12-30 ENCOUNTER — Telehealth: Payer: Self-pay

## 2015-12-30 NOTE — Telephone Encounter (Signed)
Pt called back for results on ankle.

## 2016-03-16 DIAGNOSIS — K5190 Ulcerative colitis, unspecified, without complications: Secondary | ICD-10-CM | POA: Diagnosis not present

## 2016-06-29 DIAGNOSIS — Z23 Encounter for immunization: Secondary | ICD-10-CM | POA: Diagnosis not present

## 2016-06-29 DIAGNOSIS — I10 Essential (primary) hypertension: Secondary | ICD-10-CM | POA: Diagnosis not present

## 2016-06-29 DIAGNOSIS — F322 Major depressive disorder, single episode, severe without psychotic features: Secondary | ICD-10-CM | POA: Diagnosis not present

## 2016-06-29 DIAGNOSIS — K5190 Ulcerative colitis, unspecified, without complications: Secondary | ICD-10-CM | POA: Diagnosis not present

## 2016-06-29 DIAGNOSIS — E119 Type 2 diabetes mellitus without complications: Secondary | ICD-10-CM | POA: Diagnosis not present

## 2016-06-29 DIAGNOSIS — E7800 Pure hypercholesterolemia, unspecified: Secondary | ICD-10-CM | POA: Diagnosis not present

## 2016-09-03 DIAGNOSIS — Z7984 Long term (current) use of oral hypoglycemic drugs: Secondary | ICD-10-CM | POA: Diagnosis not present

## 2016-09-03 DIAGNOSIS — E119 Type 2 diabetes mellitus without complications: Secondary | ICD-10-CM | POA: Diagnosis not present

## 2016-09-03 DIAGNOSIS — R300 Dysuria: Secondary | ICD-10-CM | POA: Diagnosis not present

## 2016-09-03 DIAGNOSIS — R350 Frequency of micturition: Secondary | ICD-10-CM | POA: Diagnosis not present

## 2016-09-03 DIAGNOSIS — Z882 Allergy status to sulfonamides status: Secondary | ICD-10-CM | POA: Diagnosis not present

## 2016-09-03 DIAGNOSIS — Z8719 Personal history of other diseases of the digestive system: Secondary | ICD-10-CM | POA: Diagnosis not present

## 2016-09-03 DIAGNOSIS — F17290 Nicotine dependence, other tobacco product, uncomplicated: Secondary | ICD-10-CM | POA: Diagnosis not present

## 2016-09-03 DIAGNOSIS — Z79899 Other long term (current) drug therapy: Secondary | ICD-10-CM | POA: Diagnosis not present

## 2016-09-03 DIAGNOSIS — R319 Hematuria, unspecified: Secondary | ICD-10-CM | POA: Diagnosis not present

## 2016-09-03 DIAGNOSIS — F419 Anxiety disorder, unspecified: Secondary | ICD-10-CM | POA: Diagnosis not present

## 2016-09-03 DIAGNOSIS — I10 Essential (primary) hypertension: Secondary | ICD-10-CM | POA: Diagnosis not present

## 2016-09-03 DIAGNOSIS — Z7982 Long term (current) use of aspirin: Secondary | ICD-10-CM | POA: Diagnosis not present

## 2016-09-03 DIAGNOSIS — Z85828 Personal history of other malignant neoplasm of skin: Secondary | ICD-10-CM | POA: Diagnosis not present

## 2016-09-03 DIAGNOSIS — N390 Urinary tract infection, site not specified: Secondary | ICD-10-CM | POA: Diagnosis not present

## 2017-03-09 DIAGNOSIS — K5190 Ulcerative colitis, unspecified, without complications: Secondary | ICD-10-CM | POA: Diagnosis not present

## 2017-03-09 DIAGNOSIS — E1165 Type 2 diabetes mellitus with hyperglycemia: Secondary | ICD-10-CM | POA: Diagnosis not present

## 2017-03-09 DIAGNOSIS — I10 Essential (primary) hypertension: Secondary | ICD-10-CM | POA: Diagnosis not present

## 2017-03-09 DIAGNOSIS — Z1389 Encounter for screening for other disorder: Secondary | ICD-10-CM | POA: Diagnosis not present

## 2017-03-09 DIAGNOSIS — F322 Major depressive disorder, single episode, severe without psychotic features: Secondary | ICD-10-CM | POA: Diagnosis not present

## 2017-03-09 DIAGNOSIS — E7800 Pure hypercholesterolemia, unspecified: Secondary | ICD-10-CM | POA: Diagnosis not present

## 2017-03-09 DIAGNOSIS — Z7984 Long term (current) use of oral hypoglycemic drugs: Secondary | ICD-10-CM | POA: Diagnosis not present

## 2017-03-17 DIAGNOSIS — K5190 Ulcerative colitis, unspecified, without complications: Secondary | ICD-10-CM | POA: Diagnosis not present

## 2017-06-12 DIAGNOSIS — N390 Urinary tract infection, site not specified: Secondary | ICD-10-CM | POA: Diagnosis not present

## 2017-06-12 DIAGNOSIS — Z882 Allergy status to sulfonamides status: Secondary | ICD-10-CM | POA: Diagnosis not present

## 2017-06-12 DIAGNOSIS — Z79899 Other long term (current) drug therapy: Secondary | ICD-10-CM | POA: Diagnosis not present

## 2017-06-12 DIAGNOSIS — Z7982 Long term (current) use of aspirin: Secondary | ICD-10-CM | POA: Diagnosis not present

## 2017-06-12 DIAGNOSIS — Z85828 Personal history of other malignant neoplasm of skin: Secondary | ICD-10-CM | POA: Diagnosis not present

## 2017-06-12 DIAGNOSIS — Z87891 Personal history of nicotine dependence: Secondary | ICD-10-CM | POA: Diagnosis not present

## 2017-06-12 DIAGNOSIS — Z8719 Personal history of other diseases of the digestive system: Secondary | ICD-10-CM | POA: Diagnosis not present

## 2017-06-12 DIAGNOSIS — Z7984 Long term (current) use of oral hypoglycemic drugs: Secondary | ICD-10-CM | POA: Diagnosis not present

## 2017-06-12 DIAGNOSIS — E119 Type 2 diabetes mellitus without complications: Secondary | ICD-10-CM | POA: Diagnosis not present

## 2017-06-12 DIAGNOSIS — R509 Fever, unspecified: Secondary | ICD-10-CM | POA: Diagnosis not present

## 2017-06-12 DIAGNOSIS — I10 Essential (primary) hypertension: Secondary | ICD-10-CM | POA: Diagnosis not present

## 2017-06-12 DIAGNOSIS — B9689 Other specified bacterial agents as the cause of diseases classified elsewhere: Secondary | ICD-10-CM | POA: Diagnosis not present

## 2017-06-12 DIAGNOSIS — F419 Anxiety disorder, unspecified: Secondary | ICD-10-CM | POA: Diagnosis not present

## 2017-06-15 DIAGNOSIS — N3000 Acute cystitis without hematuria: Secondary | ICD-10-CM | POA: Diagnosis not present

## 2017-06-15 DIAGNOSIS — N400 Enlarged prostate without lower urinary tract symptoms: Secondary | ICD-10-CM | POA: Diagnosis not present

## 2017-07-25 DIAGNOSIS — E7800 Pure hypercholesterolemia, unspecified: Secondary | ICD-10-CM | POA: Diagnosis not present

## 2017-07-25 DIAGNOSIS — F322 Major depressive disorder, single episode, severe without psychotic features: Secondary | ICD-10-CM | POA: Diagnosis not present

## 2017-07-25 DIAGNOSIS — N400 Enlarged prostate without lower urinary tract symptoms: Secondary | ICD-10-CM | POA: Diagnosis not present

## 2017-07-25 DIAGNOSIS — Z23 Encounter for immunization: Secondary | ICD-10-CM | POA: Diagnosis not present

## 2017-07-25 DIAGNOSIS — K5190 Ulcerative colitis, unspecified, without complications: Secondary | ICD-10-CM | POA: Diagnosis not present

## 2017-07-25 DIAGNOSIS — I10 Essential (primary) hypertension: Secondary | ICD-10-CM | POA: Diagnosis not present

## 2017-07-25 DIAGNOSIS — Z7984 Long term (current) use of oral hypoglycemic drugs: Secondary | ICD-10-CM | POA: Diagnosis not present

## 2017-07-25 DIAGNOSIS — E119 Type 2 diabetes mellitus without complications: Secondary | ICD-10-CM | POA: Diagnosis not present

## 2017-08-01 DIAGNOSIS — N400 Enlarged prostate without lower urinary tract symptoms: Secondary | ICD-10-CM | POA: Diagnosis not present

## 2017-08-01 DIAGNOSIS — I10 Essential (primary) hypertension: Secondary | ICD-10-CM | POA: Diagnosis not present

## 2017-08-01 DIAGNOSIS — E119 Type 2 diabetes mellitus without complications: Secondary | ICD-10-CM | POA: Diagnosis not present

## 2017-08-01 DIAGNOSIS — F322 Major depressive disorder, single episode, severe without psychotic features: Secondary | ICD-10-CM | POA: Diagnosis not present

## 2017-08-01 DIAGNOSIS — Z7984 Long term (current) use of oral hypoglycemic drugs: Secondary | ICD-10-CM | POA: Diagnosis not present

## 2017-11-16 DIAGNOSIS — N400 Enlarged prostate without lower urinary tract symptoms: Secondary | ICD-10-CM | POA: Diagnosis not present

## 2017-11-16 DIAGNOSIS — I10 Essential (primary) hypertension: Secondary | ICD-10-CM | POA: Diagnosis not present

## 2017-11-16 DIAGNOSIS — Z0000 Encounter for general adult medical examination without abnormal findings: Secondary | ICD-10-CM | POA: Diagnosis not present

## 2017-11-16 DIAGNOSIS — F322 Major depressive disorder, single episode, severe without psychotic features: Secondary | ICD-10-CM | POA: Diagnosis not present

## 2017-11-16 DIAGNOSIS — E7800 Pure hypercholesterolemia, unspecified: Secondary | ICD-10-CM | POA: Diagnosis not present

## 2017-11-16 DIAGNOSIS — E119 Type 2 diabetes mellitus without complications: Secondary | ICD-10-CM | POA: Diagnosis not present

## 2017-11-16 DIAGNOSIS — K5190 Ulcerative colitis, unspecified, without complications: Secondary | ICD-10-CM | POA: Diagnosis not present

## 2017-11-24 DIAGNOSIS — K5100 Ulcerative (chronic) pancolitis without complications: Secondary | ICD-10-CM | POA: Diagnosis not present

## 2017-12-26 HISTORY — PX: COLONOSCOPY: SHX174

## 2018-01-19 DIAGNOSIS — K5730 Diverticulosis of large intestine without perforation or abscess without bleeding: Secondary | ICD-10-CM | POA: Diagnosis not present

## 2018-01-19 DIAGNOSIS — K5289 Other specified noninfective gastroenteritis and colitis: Secondary | ICD-10-CM | POA: Diagnosis not present

## 2018-01-19 DIAGNOSIS — Z1211 Encounter for screening for malignant neoplasm of colon: Secondary | ICD-10-CM | POA: Diagnosis not present

## 2018-01-19 DIAGNOSIS — K5190 Ulcerative colitis, unspecified, without complications: Secondary | ICD-10-CM | POA: Diagnosis not present

## 2018-01-19 DIAGNOSIS — K635 Polyp of colon: Secondary | ICD-10-CM | POA: Diagnosis not present

## 2018-01-19 DIAGNOSIS — K5520 Angiodysplasia of colon without hemorrhage: Secondary | ICD-10-CM | POA: Diagnosis not present

## 2018-01-24 DIAGNOSIS — K5289 Other specified noninfective gastroenteritis and colitis: Secondary | ICD-10-CM | POA: Diagnosis not present

## 2018-01-24 DIAGNOSIS — K635 Polyp of colon: Secondary | ICD-10-CM | POA: Diagnosis not present

## 2018-01-31 DIAGNOSIS — R350 Frequency of micturition: Secondary | ICD-10-CM | POA: Diagnosis not present

## 2018-02-17 DIAGNOSIS — E1165 Type 2 diabetes mellitus with hyperglycemia: Secondary | ICD-10-CM | POA: Diagnosis not present

## 2018-02-21 DIAGNOSIS — H938X2 Other specified disorders of left ear: Secondary | ICD-10-CM | POA: Diagnosis not present

## 2018-02-21 DIAGNOSIS — H9120 Sudden idiopathic hearing loss, unspecified ear: Secondary | ICD-10-CM | POA: Diagnosis not present

## 2018-02-21 DIAGNOSIS — H903 Sensorineural hearing loss, bilateral: Secondary | ICD-10-CM | POA: Diagnosis not present

## 2018-02-23 DIAGNOSIS — I10 Essential (primary) hypertension: Secondary | ICD-10-CM | POA: Diagnosis not present

## 2018-02-23 DIAGNOSIS — Z794 Long term (current) use of insulin: Secondary | ICD-10-CM | POA: Diagnosis not present

## 2018-02-23 DIAGNOSIS — E1165 Type 2 diabetes mellitus with hyperglycemia: Secondary | ICD-10-CM | POA: Diagnosis not present

## 2018-02-23 DIAGNOSIS — N400 Enlarged prostate without lower urinary tract symptoms: Secondary | ICD-10-CM | POA: Diagnosis not present

## 2018-02-23 DIAGNOSIS — F322 Major depressive disorder, single episode, severe without psychotic features: Secondary | ICD-10-CM | POA: Diagnosis not present

## 2018-02-24 DIAGNOSIS — H9122 Sudden idiopathic hearing loss, left ear: Secondary | ICD-10-CM | POA: Diagnosis not present

## 2018-02-27 DIAGNOSIS — H9122 Sudden idiopathic hearing loss, left ear: Secondary | ICD-10-CM | POA: Diagnosis not present

## 2018-03-02 DIAGNOSIS — H9122 Sudden idiopathic hearing loss, left ear: Secondary | ICD-10-CM | POA: Diagnosis not present

## 2018-03-07 DIAGNOSIS — H9122 Sudden idiopathic hearing loss, left ear: Secondary | ICD-10-CM | POA: Diagnosis not present

## 2018-03-07 DIAGNOSIS — H903 Sensorineural hearing loss, bilateral: Secondary | ICD-10-CM | POA: Diagnosis not present

## 2018-03-10 DIAGNOSIS — H9122 Sudden idiopathic hearing loss, left ear: Secondary | ICD-10-CM | POA: Diagnosis not present

## 2018-03-14 DIAGNOSIS — H9122 Sudden idiopathic hearing loss, left ear: Secondary | ICD-10-CM | POA: Diagnosis not present

## 2018-03-16 ENCOUNTER — Other Ambulatory Visit: Payer: Self-pay

## 2018-03-16 ENCOUNTER — Encounter (HOSPITAL_COMMUNITY): Payer: Self-pay | Admitting: Emergency Medicine

## 2018-03-16 ENCOUNTER — Emergency Department (HOSPITAL_COMMUNITY): Payer: Medicare Other

## 2018-03-16 ENCOUNTER — Inpatient Hospital Stay (HOSPITAL_COMMUNITY)
Admission: EM | Admit: 2018-03-16 | Discharge: 2018-03-27 | DRG: 853 | Disposition: A | Discharge status: Rehabilitation Facility | Payer: Medicare Other | Attending: Internal Medicine | Admitting: Internal Medicine

## 2018-03-16 DIAGNOSIS — R159 Full incontinence of feces: Secondary | ICD-10-CM | POA: Diagnosis not present

## 2018-03-16 DIAGNOSIS — D72828 Other elevated white blood cell count: Secondary | ICD-10-CM | POA: Diagnosis present

## 2018-03-16 DIAGNOSIS — R0603 Acute respiratory distress: Secondary | ICD-10-CM | POA: Diagnosis not present

## 2018-03-16 DIAGNOSIS — N179 Acute kidney failure, unspecified: Secondary | ICD-10-CM | POA: Diagnosis not present

## 2018-03-16 DIAGNOSIS — A419 Sepsis, unspecified organism: Secondary | ICD-10-CM

## 2018-03-16 DIAGNOSIS — E1122 Type 2 diabetes mellitus with diabetic chronic kidney disease: Secondary | ICD-10-CM | POA: Diagnosis present

## 2018-03-16 DIAGNOSIS — G8104 Flaccid hemiplegia affecting left nondominant side: Secondary | ICD-10-CM | POA: Diagnosis present

## 2018-03-16 DIAGNOSIS — R4701 Aphasia: Secondary | ICD-10-CM | POA: Diagnosis not present

## 2018-03-16 DIAGNOSIS — A411 Sepsis due to other specified staphylococcus: Principal | ICD-10-CM | POA: Diagnosis present

## 2018-03-16 DIAGNOSIS — M86272 Subacute osteomyelitis, left ankle and foot: Secondary | ICD-10-CM | POA: Diagnosis present

## 2018-03-16 DIAGNOSIS — R471 Dysarthria and anarthria: Secondary | ICD-10-CM | POA: Diagnosis present

## 2018-03-16 DIAGNOSIS — Z8249 Family history of ischemic heart disease and other diseases of the circulatory system: Secondary | ICD-10-CM | POA: Diagnosis not present

## 2018-03-16 DIAGNOSIS — I480 Paroxysmal atrial fibrillation: Secondary | ICD-10-CM | POA: Diagnosis not present

## 2018-03-16 DIAGNOSIS — R338 Other retention of urine: Secondary | ICD-10-CM

## 2018-03-16 DIAGNOSIS — R29705 NIHSS score 5: Secondary | ICD-10-CM | POA: Diagnosis present

## 2018-03-16 DIAGNOSIS — I129 Hypertensive chronic kidney disease with stage 1 through stage 4 chronic kidney disease, or unspecified chronic kidney disease: Secondary | ICD-10-CM | POA: Diagnosis present

## 2018-03-16 DIAGNOSIS — M869 Osteomyelitis, unspecified: Secondary | ICD-10-CM | POA: Diagnosis not present

## 2018-03-16 DIAGNOSIS — N401 Enlarged prostate with lower urinary tract symptoms: Secondary | ICD-10-CM | POA: Diagnosis not present

## 2018-03-16 DIAGNOSIS — R531 Weakness: Secondary | ICD-10-CM | POA: Diagnosis not present

## 2018-03-16 DIAGNOSIS — Z89432 Acquired absence of left foot: Secondary | ICD-10-CM | POA: Diagnosis not present

## 2018-03-16 DIAGNOSIS — G8918 Other acute postprocedural pain: Secondary | ICD-10-CM | POA: Diagnosis not present

## 2018-03-16 DIAGNOSIS — R2990 Unspecified symptoms and signs involving the nervous system: Secondary | ICD-10-CM | POA: Diagnosis present

## 2018-03-16 DIAGNOSIS — D72829 Elevated white blood cell count, unspecified: Secondary | ICD-10-CM

## 2018-03-16 DIAGNOSIS — R1310 Dysphagia, unspecified: Secondary | ICD-10-CM | POA: Diagnosis present

## 2018-03-16 DIAGNOSIS — R29818 Other symptoms and signs involving the nervous system: Secondary | ICD-10-CM | POA: Diagnosis not present

## 2018-03-16 DIAGNOSIS — R5081 Fever presenting with conditions classified elsewhere: Secondary | ICD-10-CM | POA: Diagnosis not present

## 2018-03-16 DIAGNOSIS — J9600 Acute respiratory failure, unspecified whether with hypoxia or hypercapnia: Secondary | ICD-10-CM | POA: Diagnosis not present

## 2018-03-16 DIAGNOSIS — E119 Type 2 diabetes mellitus without complications: Secondary | ICD-10-CM | POA: Diagnosis not present

## 2018-03-16 DIAGNOSIS — A409 Streptococcal sepsis, unspecified: Secondary | ICD-10-CM | POA: Diagnosis not present

## 2018-03-16 DIAGNOSIS — I6523 Occlusion and stenosis of bilateral carotid arteries: Secondary | ICD-10-CM | POA: Diagnosis present

## 2018-03-16 DIAGNOSIS — I493 Ventricular premature depolarization: Secondary | ICD-10-CM | POA: Diagnosis present

## 2018-03-16 DIAGNOSIS — L0390 Cellulitis, unspecified: Secondary | ICD-10-CM

## 2018-03-16 DIAGNOSIS — Z7901 Long term (current) use of anticoagulants: Secondary | ICD-10-CM | POA: Diagnosis not present

## 2018-03-16 DIAGNOSIS — E876 Hypokalemia: Secondary | ICD-10-CM | POA: Diagnosis not present

## 2018-03-16 DIAGNOSIS — K5190 Ulcerative colitis, unspecified, without complications: Secondary | ICD-10-CM | POA: Diagnosis present

## 2018-03-16 DIAGNOSIS — R32 Unspecified urinary incontinence: Secondary | ICD-10-CM | POA: Diagnosis present

## 2018-03-16 DIAGNOSIS — R748 Abnormal levels of other serum enzymes: Secondary | ICD-10-CM | POA: Diagnosis not present

## 2018-03-16 DIAGNOSIS — L97529 Non-pressure chronic ulcer of other part of left foot with unspecified severity: Secondary | ICD-10-CM

## 2018-03-16 DIAGNOSIS — R6510 Systemic inflammatory response syndrome (SIRS) of non-infectious origin without acute organ dysfunction: Secondary | ICD-10-CM

## 2018-03-16 DIAGNOSIS — E1151 Type 2 diabetes mellitus with diabetic peripheral angiopathy without gangrene: Secondary | ICD-10-CM | POA: Diagnosis present

## 2018-03-16 DIAGNOSIS — R402142 Coma scale, eyes open, spontaneous, at arrival to emergency department: Secondary | ICD-10-CM | POA: Diagnosis not present

## 2018-03-16 DIAGNOSIS — L02612 Cutaneous abscess of left foot: Secondary | ICD-10-CM | POA: Diagnosis not present

## 2018-03-16 DIAGNOSIS — H9042 Sensorineural hearing loss, unilateral, left ear, with unrestricted hearing on the contralateral side: Secondary | ICD-10-CM | POA: Diagnosis present

## 2018-03-16 DIAGNOSIS — R402362 Coma scale, best motor response, obeys commands, at arrival to emergency department: Secondary | ICD-10-CM | POA: Diagnosis present

## 2018-03-16 DIAGNOSIS — R29810 Facial weakness: Secondary | ICD-10-CM | POA: Diagnosis not present

## 2018-03-16 DIAGNOSIS — L03032 Cellulitis of left toe: Secondary | ICD-10-CM | POA: Diagnosis not present

## 2018-03-16 DIAGNOSIS — Z7984 Long term (current) use of oral hypoglycemic drugs: Secondary | ICD-10-CM

## 2018-03-16 DIAGNOSIS — L97423 Non-pressure chronic ulcer of left heel and midfoot with necrosis of muscle: Secondary | ICD-10-CM | POA: Diagnosis not present

## 2018-03-16 DIAGNOSIS — B952 Enterococcus as the cause of diseases classified elsewhere: Secondary | ICD-10-CM | POA: Diagnosis not present

## 2018-03-16 DIAGNOSIS — R4702 Dysphasia: Secondary | ICD-10-CM | POA: Diagnosis present

## 2018-03-16 DIAGNOSIS — L97521 Non-pressure chronic ulcer of other part of left foot limited to breakdown of skin: Secondary | ICD-10-CM | POA: Diagnosis not present

## 2018-03-16 DIAGNOSIS — R410 Disorientation, unspecified: Secondary | ICD-10-CM | POA: Diagnosis not present

## 2018-03-16 DIAGNOSIS — I4891 Unspecified atrial fibrillation: Secondary | ICD-10-CM | POA: Diagnosis not present

## 2018-03-16 DIAGNOSIS — E871 Hypo-osmolality and hyponatremia: Secondary | ICD-10-CM | POA: Diagnosis not present

## 2018-03-16 DIAGNOSIS — R000 Tachycardia, unspecified: Secondary | ICD-10-CM

## 2018-03-16 DIAGNOSIS — Z4781 Encounter for orthopedic aftercare following surgical amputation: Secondary | ICD-10-CM | POA: Diagnosis not present

## 2018-03-16 DIAGNOSIS — N183 Chronic kidney disease, stage 3 (moderate): Secondary | ICD-10-CM | POA: Diagnosis not present

## 2018-03-16 DIAGNOSIS — J320 Chronic maxillary sinusitis: Secondary | ICD-10-CM | POA: Diagnosis present

## 2018-03-16 DIAGNOSIS — E785 Hyperlipidemia, unspecified: Secondary | ICD-10-CM | POA: Diagnosis present

## 2018-03-16 DIAGNOSIS — R4781 Slurred speech: Secondary | ICD-10-CM | POA: Diagnosis not present

## 2018-03-16 DIAGNOSIS — E46 Unspecified protein-calorie malnutrition: Secondary | ICD-10-CM | POA: Diagnosis not present

## 2018-03-16 DIAGNOSIS — R4182 Altered mental status, unspecified: Secondary | ICD-10-CM | POA: Diagnosis not present

## 2018-03-16 DIAGNOSIS — Z8673 Personal history of transient ischemic attack (TIA), and cerebral infarction without residual deficits: Secondary | ICD-10-CM

## 2018-03-16 DIAGNOSIS — D62 Acute posthemorrhagic anemia: Secondary | ICD-10-CM | POA: Diagnosis not present

## 2018-03-16 DIAGNOSIS — I6300 Cerebral infarction due to thrombosis of unspecified precerebral artery: Secondary | ICD-10-CM | POA: Diagnosis not present

## 2018-03-16 DIAGNOSIS — E1169 Type 2 diabetes mellitus with other specified complication: Secondary | ICD-10-CM | POA: Diagnosis present

## 2018-03-16 DIAGNOSIS — Z79899 Other long term (current) drug therapy: Secondary | ICD-10-CM | POA: Diagnosis not present

## 2018-03-16 DIAGNOSIS — I96 Gangrene, not elsewhere classified: Secondary | ICD-10-CM | POA: Diagnosis not present

## 2018-03-16 DIAGNOSIS — Z7982 Long term (current) use of aspirin: Secondary | ICD-10-CM

## 2018-03-16 DIAGNOSIS — Z882 Allergy status to sulfonamides status: Secondary | ICD-10-CM

## 2018-03-16 DIAGNOSIS — I700 Atherosclerosis of aorta: Secondary | ICD-10-CM | POA: Diagnosis present

## 2018-03-16 DIAGNOSIS — E11621 Type 2 diabetes mellitus with foot ulcer: Secondary | ICD-10-CM | POA: Diagnosis present

## 2018-03-16 DIAGNOSIS — R0902 Hypoxemia: Secondary | ICD-10-CM | POA: Diagnosis not present

## 2018-03-16 DIAGNOSIS — R5381 Other malaise: Secondary | ICD-10-CM | POA: Diagnosis not present

## 2018-03-16 DIAGNOSIS — R402441 Other coma, without documented Glasgow coma scale score, or with partial score reported, in the field [EMT or ambulance]: Secondary | ICD-10-CM | POA: Diagnosis not present

## 2018-03-16 DIAGNOSIS — R270 Ataxia, unspecified: Secondary | ICD-10-CM | POA: Diagnosis present

## 2018-03-16 DIAGNOSIS — I10 Essential (primary) hypertension: Secondary | ICD-10-CM | POA: Diagnosis not present

## 2018-03-16 DIAGNOSIS — Z833 Family history of diabetes mellitus: Secondary | ICD-10-CM | POA: Diagnosis not present

## 2018-03-16 DIAGNOSIS — I639 Cerebral infarction, unspecified: Secondary | ICD-10-CM | POA: Diagnosis present

## 2018-03-16 DIAGNOSIS — M86172 Other acute osteomyelitis, left ankle and foot: Secondary | ICD-10-CM | POA: Diagnosis not present

## 2018-03-16 DIAGNOSIS — R0689 Other abnormalities of breathing: Secondary | ICD-10-CM | POA: Diagnosis not present

## 2018-03-16 DIAGNOSIS — I481 Persistent atrial fibrillation: Secondary | ICD-10-CM | POA: Diagnosis not present

## 2018-03-16 DIAGNOSIS — R402252 Coma scale, best verbal response, oriented, at arrival to emergency department: Secondary | ICD-10-CM | POA: Diagnosis present

## 2018-03-16 DIAGNOSIS — L97429 Non-pressure chronic ulcer of left heel and midfoot with unspecified severity: Secondary | ICD-10-CM | POA: Diagnosis not present

## 2018-03-16 HISTORY — DX: Unspecified symptoms and signs involving the nervous system: R29.90

## 2018-03-16 LAB — I-STAT TROPONIN, ED: Troponin i, poc: 0.08 ng/mL (ref 0.00–0.08)

## 2018-03-16 LAB — CBC
HCT: 39.4 % (ref 39.0–52.0)
Hemoglobin: 13.1 g/dL (ref 13.0–17.0)
MCH: 29.2 pg (ref 26.0–34.0)
MCHC: 33.2 g/dL (ref 30.0–36.0)
MCV: 87.9 fL (ref 78.0–100.0)
Platelets: 274 10*3/uL (ref 150–400)
RBC: 4.48 MIL/uL (ref 4.22–5.81)
RDW: 12.3 % (ref 11.5–15.5)
WBC: 25.9 10*3/uL — ABNORMAL HIGH (ref 4.0–10.5)

## 2018-03-16 LAB — I-STAT CHEM 8, ED
BUN: 32 mg/dL — ABNORMAL HIGH (ref 6–20)
Calcium, Ion: 0.95 mmol/L — ABNORMAL LOW (ref 1.15–1.40)
Chloride: 90 mmol/L — ABNORMAL LOW (ref 101–111)
Creatinine, Ser: 1.9 mg/dL — ABNORMAL HIGH (ref 0.61–1.24)
Glucose, Bld: 312 mg/dL — ABNORMAL HIGH (ref 65–99)
HCT: 40 % (ref 39.0–52.0)
Hemoglobin: 13.6 g/dL (ref 13.0–17.0)
Potassium: 4.4 mmol/L (ref 3.5–5.1)
Sodium: 124 mmol/L — ABNORMAL LOW (ref 135–145)
TCO2: 18 mmol/L — ABNORMAL LOW (ref 22–32)

## 2018-03-16 LAB — URINALYSIS, ROUTINE W REFLEX MICROSCOPIC
Bilirubin Urine: NEGATIVE
Glucose, UA: >=500 mg/dL — AB
Ketones, ur: 5 mg/dL — AB
Nitrite: NEGATIVE
Protein, ur: 30 mg/dL — AB
Specific Gravity, Urine: 1.011 (ref 1.005–1.030)
pH: 6 (ref 5.0–8.0)

## 2018-03-16 LAB — COMPREHENSIVE METABOLIC PANEL
ALT: 46 U/L (ref 17–63)
AST: 62 U/L — ABNORMAL HIGH (ref 15–41)
Albumin: 2.7 g/dL — ABNORMAL LOW (ref 3.5–5.0)
Alkaline Phosphatase: 117 U/L (ref 38–126)
Anion gap: 18 — ABNORMAL HIGH (ref 5–15)
BUN: 33 mg/dL — ABNORMAL HIGH (ref 6–20)
CO2: 18 mmol/L — ABNORMAL LOW (ref 22–32)
Calcium: 8.3 mg/dL — ABNORMAL LOW (ref 8.9–10.3)
Chloride: 90 mmol/L — ABNORMAL LOW (ref 101–111)
Creatinine, Ser: 2.17 mg/dL — ABNORMAL HIGH (ref 0.61–1.24)
GFR calc Af Amer: 33 mL/min — ABNORMAL LOW (ref >60)
GFR calc non Af Amer: 28 mL/min — ABNORMAL LOW (ref >60)
Glucose, Bld: 304 mg/dL — ABNORMAL HIGH (ref 65–99)
Potassium: 4.7 mmol/L (ref 3.5–5.1)
Sodium: 126 mmol/L — ABNORMAL LOW (ref 135–145)
Total Bilirubin: 1.1 mg/dL (ref 0.3–1.2)
Total Protein: 6.8 g/dL (ref 6.5–8.1)

## 2018-03-16 LAB — DIFFERENTIAL
Basophils Absolute: 0 10*3/uL (ref 0.0–0.1)
Basophils Relative: 0 %
Eosinophils Absolute: 0 10*3/uL (ref 0.0–0.7)
Eosinophils Relative: 0 %
Lymphocytes Relative: 3 %
Lymphs Abs: 0.8 10*3/uL (ref 0.7–4.0)
Monocytes Absolute: 1.3 10*3/uL — ABNORMAL HIGH (ref 0.1–1.0)
Monocytes Relative: 5 %
Neutro Abs: 23.8 10*3/uL — ABNORMAL HIGH (ref 1.7–7.7)
Neutrophils Relative %: 92 %

## 2018-03-16 LAB — PROTIME-INR
INR: 1.23
Prothrombin Time: 15.4 seconds — ABNORMAL HIGH (ref 11.4–15.2)

## 2018-03-16 LAB — RAPID URINE DRUG SCREEN, HOSP PERFORMED
Amphetamines: NOT DETECTED
Benzodiazepines: NOT DETECTED
Cocaine: NOT DETECTED
Opiates: NOT DETECTED
Tetrahydrocannabinol: NOT DETECTED

## 2018-03-16 LAB — ETHANOL: Alcohol, Ethyl (B): <10 mg/dL (ref <10)

## 2018-03-16 LAB — APTT: aPTT: 32 seconds (ref 24–36)

## 2018-03-16 MED ORDER — METOPROLOL TARTRATE 5 MG/5ML IV SOLN
2.5000 mg | Freq: Once | INTRAVENOUS | Status: AC
  Administered 2018-03-16: 2.5 mg via INTRAVENOUS
  Filled 2018-03-16: qty 5

## 2018-03-16 MED ORDER — STROKE: EARLY STAGES OF RECOVERY BOOK
Freq: Once | Status: AC
  Administered 2018-03-16
  Filled 2018-03-16: qty 1

## 2018-03-16 MED ORDER — SENNOSIDES-DOCUSATE SODIUM 8.6-50 MG PO TABS: 1.0000 | ORAL_TABLET | Freq: Every evening | ORAL | Status: DC | PRN

## 2018-03-16 MED ORDER — INSULIN ASPART 100 UNIT/ML ~~LOC~~ SOLN
4.0000 [IU] | Freq: Three times a day (TID) | SUBCUTANEOUS | Status: DC
  Administered 2018-03-17 (×2): 4 [IU] via SUBCUTANEOUS

## 2018-03-16 MED ORDER — ACETAMINOPHEN 160 MG/5ML PO SOLN: 650.0000 mg | ORAL | Status: DC | PRN

## 2018-03-16 MED ORDER — FAMOTIDINE IN NACL 20-0.9 MG/50ML-% IV SOLN
20.0000 mg | Freq: Two times a day (BID) | INTRAVENOUS | Status: DC
  Administered 2018-03-16 – 2018-03-18 (×4): 20 mg via INTRAVENOUS
  Filled 2018-03-16 (×4): qty 50

## 2018-03-16 MED ORDER — LABETALOL HCL 5 MG/ML IV SOLN: 20.0000 mg | Freq: Once | INTRAVENOUS | Status: DC

## 2018-03-16 MED ORDER — ACETAMINOPHEN 325 MG PO TABS: 650.0000 mg | ORAL_TABLET | ORAL | Status: DC | PRN

## 2018-03-16 MED ORDER — METOPROLOL SUCCINATE ER 50 MG PO TB24
100.0000 mg | ORAL_TABLET | Freq: Every day | ORAL | Status: DC
  Administered 2018-03-17 – 2018-03-21 (×5): 100 mg via ORAL
  Filled 2018-03-16 (×5): qty 2

## 2018-03-16 MED ORDER — INSULIN ASPART 100 UNIT/ML ~~LOC~~ SOLN
0.0000 [IU] | Freq: Three times a day (TID) | SUBCUTANEOUS | Status: DC
  Administered 2018-03-17: 5 [IU] via SUBCUTANEOUS

## 2018-03-16 MED ORDER — IOPAMIDOL (ISOVUE-370) INJECTION 76%
50.0000 mL | Freq: Once | INTRAVENOUS | Status: AC | PRN
  Administered 2018-03-16: 50 mL via INTRAVENOUS

## 2018-03-16 MED ORDER — ACETAMINOPHEN 650 MG RE SUPP
650.0000 mg | RECTAL | Status: DC | PRN
  Filled 2018-03-16: qty 1

## 2018-03-16 MED ORDER — CLEVIDIPINE BUTYRATE 0.5 MG/ML IV EMUL: 0.0000 mg/h | INTRAVENOUS | Status: DC

## 2018-03-16 MED ORDER — SODIUM CHLORIDE 0.9 % IV SOLN
50.0000 mL | Freq: Once | INTRAVENOUS | Status: AC
  Administered 2018-03-16: 50 mL via INTRAVENOUS

## 2018-03-16 MED ORDER — SODIUM CHLORIDE 0.9 % IV SOLN
INTRAVENOUS | Status: AC
  Administered 2018-03-16 – 2018-03-17 (×3): via INTRAVENOUS

## 2018-03-16 MED ORDER — ALTEPLASE (STROKE) FULL DOSE INFUSION
0.9000 mg/kg | Freq: Once | INTRAVENOUS | Status: AC
  Administered 2018-03-16: 88 mg via INTRAVENOUS
  Filled 2018-03-16: qty 100

## 2018-03-16 MED ORDER — FLUOXETINE HCL 20 MG PO CAPS
20.0000 mg | ORAL_CAPSULE | Freq: Every day | ORAL | Status: DC
  Administered 2018-03-17 – 2018-03-27 (×11): 20 mg via ORAL
  Filled 2018-03-16 (×11): qty 1

## 2018-03-16 MED ORDER — ROSUVASTATIN CALCIUM 20 MG PO TABS
20.0000 mg | ORAL_TABLET | Freq: Every day | ORAL | Status: DC
  Administered 2018-03-18 – 2018-03-19 (×2): 20 mg via ORAL
  Filled 2018-03-16 (×3): qty 1

## 2018-03-16 MED ORDER — MESALAMINE ER 0.375 G PO CP24
0.7500 g | ORAL_CAPSULE | Freq: Two times a day (BID) | ORAL | Status: DC
  Administered 2018-03-17 – 2018-03-21 (×10): 0.75 g via ORAL
  Filled 2018-03-16 (×9): qty 2

## 2018-03-16 NOTE — ED Notes (Signed)
Dr.Lindzen paged for bed request  

## 2018-03-16 NOTE — ED Triage Notes (Signed)
Pt BIB [facility] with sudden onset expressive aphasia, left arm flaccid and left leg weakness. LSN [id number]. On arrival, pt answering questions appropriately, moving left arm and left leg well. Left facial droop noted.

## 2018-03-16 NOTE — ED Notes (Signed)
Blood collected

## 2018-03-16 NOTE — ED Notes (Addendum)
Spoke with Dr. Cheral Marker about placing bed request. Awaiting orders at this time

## 2018-03-16 NOTE — Progress Notes (Signed)
Pharmacist Code Stroke Response  Notified to mix tPA at 2052 by Dr. Cheral Marker Delivered tPA to RN at 2056  Issues/delays encountered (if applicable): N/A  Leroy Libman, PharmD Pharmacy Resident

## 2018-03-16 NOTE — Progress Notes (Signed)
Pt having frequent cardiac arrhythmias, pt in non symptomatic, expresses no chest pain or arm tingling. 12 lead EKG obtained, MD made aware.   MD to consult cardiology. WCTM

## 2018-03-16 NOTE — Consult Note (Addendum)
Referring Physician: Dr. Tomi Bamberger    Chief Complaint: Acute onset of left sided weakness, left facial droop and dysphasia  HPI: Kevin [name] is an 77 y.o. male with a PMHx of DM and HTN who presents with acute onset of left sided weakness, left facial droop and dysphasia. LKN was 1845. He wqas found laying on his floor with expressive aphasia and no LUE movement. Also with left sensory loss and confusion. LVO score en route was 6. Not on blood thinners or ASA. CBG by EMS was 295. BP 160/70.  LSN: [id number] tPA Given: Yes  Past Medical History:  Diagnosis Date  . Diabetes mellitus without complication ([place])   . Hypertension     Past Surgical History:  Procedure Laterality Date  . WRIST SURGERY      No family history on file. Social History:  reports that he has never smoked. He does not have any smokeless tobacco history on file. He reports that he drinks alcohol. His drug history is not on file.  Allergies: No Known Allergies  Home Medications:    ROS: As per HPI. No other complaints except for urinary incontinence on expedited ROS.   Physical Examination: Weight 97.8 kg (215 lb 9.8 oz).  HEENT: [place]/AT Lungs: Respirations unlabored Ext: Dressing to plantar aspect of left foot. No pitting edema.  Neurologic Examination: Mental Status: Alert, fully oriented, mild cognitive slowing with difficulty explaining a proverb and slow response to 2-step directional command. Speech fluent with some difficulty naming (one object). Repetition intact. Cranial Nerves: II:  Visual fields intact without extinction to DSS. PERRL.  III,IV, VI: EOMI with saccadic pursuits noted. No ptosis.  V,VII: Subtle left facial droop. Facial temp sensation equal bilaterally VIII: hearing intact to voice IX,X: Palate rises symmetrically XI: Head at midline XII: midline tongue extension  Motor: RUE: 5/5 LUE: 3/5 proximal and distal RLE 5/5 LLE: 4+/5 Sensory: Temp sensation equal bilaterally all 4  limbs. Positive extinction to FT RUE and RLE.  Deep Tendon Reflexes:  1+ bilateral brachioradialis and biceps 1+ patellae bilaterally 0 achilles bilaterally Plantars: Right: M[place]   Left: tonically upgoing Cerebellar: Prominent ataxia with FNF on left. No definite ataxia with H-S bilaterally. Prominent action tremor bilateral upper ext.  Gait: Deferred due to acuity of presentation   No results found for this or any previous visit (from the past 48 hour(s)). No results found.  Assessment: 77 y.o. male presenting with acute onset of left sided weakness and dysphasia.  1. Dysphasia has resolved except for minor naming and comprehension deficit. Prominent LUE weakness and subtle LLE weakness still present.  2. CT head without acute abnormality.  3. CTA head/neck preliminary review reveals no LVO.  4. Stroke Risk Factors - DM and HTN 5. History of ulcerative colitis. On Mesalamine.   Plan: 1. There are no absolute contraindications to tPA. The patient is a tPA candidate. Discussed extensively the risks/benefits of tPA treatment vs. no treatment with the patient, including risks of hemorrhage and death with tPA administration versus worse overall outcomes on average in patients within tPA time window who are not administered tPA. Overall benefits of tPA regarding long-term prognosis are felt to outweigh risks. The patient expressed understanding and wish to proceed with tPA.  2. Admit to ICU following tPA. Orders to include frequent neuro checks and BP management.  3. MRI brain. 4. TTE.  5. Increase dose of rosuvastatin.  6. Repeat CT head 24 hours following tPA.  7. No antiplatelet medications or anticoagulants  for at least 24 hours following tPA. Can start ASA if repeat CT is negative for hemorrhagic conversion.   8. DVT prophylaxis with SCDs.  9. PT/OT/Speech.  57. Given advanced age, statin risks most likely outweigh potential benefits.  11. Cardiac telemetry 12. HgbA1c, fasting lipid  panel 13. Metformin must be held for 48 hours due to impaired renal function and s/p iodinated contrast administration. The patient's DM will be managed with SSI during this admission.  14. Wound care consult for management of left plantar lesion, in the context of his diabetes.  15. Continue mesalamine  55 minutes spent in the emergent neurological evaluation and management of this acute stroke patient.   @Electronically  signed: Dr. Kerney Elbe  @[email or web address] @ 03/16/2018, 8:48 PM

## 2018-03-16 NOTE — ED Notes (Signed)
tPa 8.8 mg IV bolus started.

## 2018-03-16 NOTE — Progress Notes (Signed)
On arrival to the floor, after telemetry monitoring was started, frequent PVC's were noted, up to 50's-60's per minute. The patient is asymptomatic. Due to the high frequency of the PVCs Cardiology has been called. Metoprolol 2.5 mg IV was recommended in addition to cardiac enzymes. Cardiology will see after trial of metoprolol and after enzymes have been ordered.   Electronically signed: Dr. Kerney Elbe

## 2018-03-16 NOTE — ED Provider Notes (Signed)
[name] Provider Note   CSN: [id number] Arrival date & time: 03/16/18  2037   An emergency department physician performed an initial assessment on this suspected stroke patient at 2030.  History   Chief Complaint Chief Complaint  Patient presents with  . Code Stroke    HPI Kevin [name] is a 77 y.o. male.  HPI Patient presented to the emergency room for evaluation of sudden onset of a aphasia associated with flaccid left arm and left leg.  Patient was last seen normal at 1845 hrs.  He was found lying on his floor with an expressive a aphasia and no movement of his extremities.  Patient was also confused.  Patient was transported by EMS.  Code stroke was activated.  Patient was seen by Dr. Cheral Marker, neurology in the ED.  After initial evaluation, family also arrived.  The mention the patient's had a sore on the bottom of his left foot.  It seems to be increasing in size and they have noticed some malodorous drainage. Past Medical History:  Diagnosis Date  . Diabetes mellitus without complication ([place])   . Hypertension     Patient Active Problem List   Diagnosis Date Noted  . Strain of left tibialis anterior muscle 12/19/2015  . 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 Fracture of radius, distal, left, closed 12/19/2012    Past Surgical History:  Procedure Laterality Date  . WRIST SURGERY          Home Medications    Prior to Admission medications   Medication Sig Start Date End Date Taking? Authorizing Provider  FLUOXETINE HCL PO Take by mouth.    [provider]  mesalamine (APRISO) 0.375 g 24 hr capsule Take 375 mg by mouth daily.    [provider]  metFORMIN (GLUCOPHAGE) 500 MG tablet Take 500 mg by mouth 2 (two) times daily with a meal.    [provider]  metoprolol succinate (TOPROL-XL) 100 MG 24 hr tablet Take 100 mg by mouth daily. Take with or immediately following a meal.     [provider]  olsalazine (DIPENTUM) 250 MG capsule Take 500 mg by mouth 2 (two) times daily.    [provider]  rosuvastatin (CRESTOR) 10 MG tablet Take 10 mg by mouth daily.    [provider]  SitaGLIPtin Phosphate (JANUVIA PO) Take by mouth.    [provider]    Family History No family history on file.  Social History Social History   Tobacco Use  . Smoking status: Never Smoker  . Smokeless tobacco: Never Used  Substance Use Topics  . Alcohol use: Yes  . Drug use: Never     Allergies   Patient has no known allergies.   Review of Systems Review of Systems  All other systems reviewed and are negative.    Physical Exam Updated Vital Signs BP 135/60   Pulse (!) 110   Temp 98.9 F (37.2 C) (Temporal)   Resp 19   Ht 1.854 m (6\' 1" )   Wt 97.8 kg (215 lb 9.8 oz)   SpO2 96%   BMI 28.45 kg/m   Physical Exam  Constitutional: He appears well-developed and well-nourished. No distress.  HENT:  Head: Normocephalic and atraumatic.  Right Ear: External ear normal.  Left Ear: External ear normal.  Eyes: Conjunctivae are normal. Right eye exhibits no discharge. Left eye exhibits no discharge. No scleral icterus.  Neck: Neck supple. No tracheal deviation  present.  Cardiovascular: Normal rate, regular rhythm and intact distal pulses.  Pulmonary/Chest: Effort normal and breath sounds normal. No stridor. No respiratory distress. He has no wheezes. He has no rales.  Abdominal: Soft. Bowel sounds are normal. He exhibits no distension. There is no tenderness. There is no rebound and no guarding.  Musculoskeletal: He exhibits edema and tenderness.  Callus with central ulceration on the bottom of his left foot, some malodorous drainage with surrounding edema  Neurological: He is alert. He has normal strength. No cranial nerve deficit (no facial droop, extraocular movements intact, no slurred speech) or sensory deficit. He exhibits normal  muscle tone. He displays no seizure activity. Coordination normal.  Skin: Skin is warm and dry. No rash noted.  Psychiatric: He has a normal mood and affect.  Nursing note and vitals reviewed.    ED Treatments / Results  Labs (all labs ordered are listed, but only abnormal results are displayed) Labs Reviewed  PROTIME-INR - Abnormal; Notable for the following components:      Result Value   Prothrombin Time 15.4 (*)    All other components within normal limits  I-STAT CHEM 8, ED - Abnormal; Notable for the following components:   Sodium 124 (*)    Chloride 90 (*)    BUN 32 (*)    Creatinine, Ser 1.90 (*)    Glucose, Bld 312 (*)    Calcium, Ion 0.95 (*)    TCO2 18 (*)    All other components within normal limits  APTT  ETHANOL  CBC  DIFFERENTIAL  COMPREHENSIVE METABOLIC PANEL  RAPID URINE DRUG SCREEN, HOSP PERFORMED  URINALYSIS, ROUTINE W REFLEX MICROSCOPIC  I-STAT TROPONIN, ED    EKG EKG Interpretation  Date/Time:  Thursday March 16 2018 21:10:44 EDT Ventricular Rate:  110 PR Interval:    QRS Duration: 134 QT Interval:  362 QTC Calculation: 490 R Axis:   96 Text Interpretation:  Sinus tachycardia Right bundle branch block No old tracing to compare Confirmed by Dorie Rank [phone]) on 03/16/2018 9:13:29 PM   Radiology Ct Head Code Stroke Wo Contrast  Result Date: 03/16/2018 CLINICAL DATA:  Code stroke. 77 year old male with left side weakness and facial droop last seen normal 1840 hours. EXAM: CT HEAD WITHOUT CONTRAST TECHNIQUE: Contiguous axial images were obtained from the base of the skull through the vertex without intravenous contrast. COMPARISON:  None. FINDINGS: Brain: No midline shift, ventriculomegaly, mass effect, evidence of mass lesion, intracranial hemorrhage or evidence of cortically based acute infarction. No cortical encephalomalacia identified. Bilateral gray-white matter differentiation is within normal limits for age. Vascular: Calcified atherosclerosis  at the skull base. No suspicious intracranial vascular hyperdensity. Skull: Negative. Sinuses/Orbits: Maxillary mucoperiosteal thickening greater on the left. Other paranasal sinuses are well pneumatized. Tympanic cavities and mastoids are clear. Other: Visualized orbit soft tissues are within normal limits. Visualized scalp soft tissues are within normal limits. ASPECTS [facility] Stroke Program Early CT Score) - Ganglionic level infarction (caudate, lentiform nuclei, internal capsule, insula, M1-M3 cortex): 7 - Supraganglionic infarction (M4-M6 cortex): 3 Total score (0-10 with 10 being normal): 10 IMPRESSION: 1. Negative for age. No acute cortically based infarct or intracranial hemorrhage is identified. 2. ASPECTS is 10. 3. Chronic maxillary sinusitis. 4. These results were communicated to Dr. Cheral Marker at 8:50 pmon 6/20/2019by text page via the [facility] messaging system. Electronically Signed   By: Genevie Ann M.D.   On: 03/16/2018 20:51    Procedures Procedures (including critical care time)  Medications Ordered in ED  Medications  alteplase (ACTIVASE) 1 mg/mL infusion 88 mg (88 mg Intravenous New Bag/Given 03/16/18 2105)    Followed by  0.9 %  sodium chloride infusion (has no administration in time range)  iopamidol (ISOVUE-370) 76 % injection 50 mL (50 mLs Intravenous Contrast Given 03/16/18 2056)     Initial Impression / Assessment and Plan / ED Course  I have reviewed the triage vital signs and the nursing notes.  Pertinent labs & imaging results that were available during my care of the patient were reviewed by me and considered in my medical decision making (see chart for details).   Patient presented as a code stroke.  He was administered IV TPA by the neurology team.  Patient states he is feeling better.  Incidental sore noted on his left foot.  Patient may benefit from wound care assessment.  Patient will be admitted to the hospital by neurology.  Final Clinical Impressions(s) / ED Diagnoses     Final diagnoses:  Cerebrovascular accident (CVA), unspecified mechanism ([place])  Skin ulcer of left foot, limited to breakdown of skin [facility])      Dorie Rank, MD 03/16/18 2126

## 2018-03-16 NOTE — ED Notes (Signed)
I stat chem 8 resulted at 2039 not showing in epic.  I will give copy to edp and nurse.

## 2018-03-16 NOTE — ED Notes (Signed)
Dr. Cheral Marker paged again for bed request.

## 2018-03-17 ENCOUNTER — Inpatient Hospital Stay (HOSPITAL_COMMUNITY): Payer: Medicare Other | Admitting: Anesthesiology

## 2018-03-17 ENCOUNTER — Encounter (HOSPITAL_COMMUNITY): Payer: Self-pay | Admitting: Cardiology

## 2018-03-17 ENCOUNTER — Encounter (HOSPITAL_COMMUNITY)
Admission: EM | Disposition: A | Discharge status: Rehabilitation Facility | Payer: Self-pay | Source: Home / Self Care | Attending: Internal Medicine

## 2018-03-17 ENCOUNTER — Inpatient Hospital Stay (HOSPITAL_COMMUNITY): Payer: Medicare Other

## 2018-03-17 ENCOUNTER — Other Ambulatory Visit: Payer: Self-pay

## 2018-03-17 DIAGNOSIS — L0390 Cellulitis, unspecified: Secondary | ICD-10-CM

## 2018-03-17 DIAGNOSIS — I4940 Unspecified premature depolarization: Secondary | ICD-10-CM

## 2018-03-17 DIAGNOSIS — I6300 Cerebral infarction due to thrombosis of unspecified precerebral artery: Secondary | ICD-10-CM

## 2018-03-17 DIAGNOSIS — L97521 Non-pressure chronic ulcer of other part of left foot limited to breakdown of skin: Secondary | ICD-10-CM

## 2018-03-17 DIAGNOSIS — A419 Sepsis, unspecified organism: Secondary | ICD-10-CM

## 2018-03-17 DIAGNOSIS — R0603 Acute respiratory distress: Secondary | ICD-10-CM

## 2018-03-17 DIAGNOSIS — I480 Paroxysmal atrial fibrillation: Secondary | ICD-10-CM

## 2018-03-17 DIAGNOSIS — I10 Essential (primary) hypertension: Secondary | ICD-10-CM

## 2018-03-17 DIAGNOSIS — R748 Abnormal levels of other serum enzymes: Secondary | ICD-10-CM

## 2018-03-17 DIAGNOSIS — J9600 Acute respiratory failure, unspecified whether with hypoxia or hypercapnia: Secondary | ICD-10-CM

## 2018-03-17 DIAGNOSIS — I493 Ventricular premature depolarization: Secondary | ICD-10-CM

## 2018-03-17 HISTORY — PX: I&D EXTREMITY: SHX5045

## 2018-03-17 LAB — GLUCOSE, CAPILLARY
Glucose-Capillary: 126 mg/dL — ABNORMAL HIGH (ref 65–99)
Glucose-Capillary: 131 mg/dL — ABNORMAL HIGH (ref 65–99)
Glucose-Capillary: 205 mg/dL — ABNORMAL HIGH (ref 65–99)
Glucose-Capillary: 276 mg/dL — ABNORMAL HIGH (ref 65–99)
Glucose-Capillary: 311 mg/dL — ABNORMAL HIGH (ref 65–99)
Glucose-Capillary: 88 mg/dL (ref 65–99)

## 2018-03-17 LAB — RENAL FUNCTION PANEL
Albumin: 2.6 g/dL — ABNORMAL LOW (ref 3.5–5.0)
Anion gap: 13 (ref 5–15)
BUN: 32 mg/dL — ABNORMAL HIGH (ref 6–20)
CO2: 24 mmol/L (ref 22–32)
Calcium: 8.2 mg/dL — ABNORMAL LOW (ref 8.9–10.3)
Chloride: 89 mmol/L — ABNORMAL LOW (ref 101–111)
Creatinine, Ser: 1.71 mg/dL — ABNORMAL HIGH (ref 0.61–1.24)
GFR calc Af Amer: 44 mL/min — ABNORMAL LOW (ref >60)
GFR calc non Af Amer: 38 mL/min — ABNORMAL LOW (ref >60)
Glucose, Bld: 292 mg/dL — ABNORMAL HIGH (ref 65–99)
Phosphorus: 3.2 mg/dL (ref 2.5–4.6)
Potassium: 3.9 mmol/L (ref 3.5–5.1)
Sodium: 126 mmol/L — ABNORMAL LOW (ref 135–145)

## 2018-03-17 LAB — CBC WITH DIFFERENTIAL/PLATELET
Abs Immature Granulocytes: 0.2 10*3/uL — ABNORMAL HIGH (ref 0.0–0.1)
Basophils Absolute: 0.1 10*3/uL (ref 0.0–0.1)
Basophils Relative: 0 %
Eosinophils Absolute: 0 10*3/uL (ref 0.0–0.7)
Eosinophils Relative: 0 %
HCT: 35.5 % — ABNORMAL LOW (ref 39.0–52.0)
Hemoglobin: 11.9 g/dL — ABNORMAL LOW (ref 13.0–17.0)
Immature Granulocytes: 1 %
Lymphocytes Relative: 4 %
Lymphs Abs: 1 10*3/uL (ref 0.7–4.0)
MCH: 29.2 pg (ref 26.0–34.0)
MCHC: 33.5 g/dL (ref 30.0–36.0)
MCV: 87.2 fL (ref 78.0–100.0)
Monocytes Absolute: 1.4 10*3/uL — ABNORMAL HIGH (ref 0.1–1.0)
Monocytes Relative: 5 %
Neutro Abs: 25 10*3/uL — ABNORMAL HIGH (ref 1.7–7.7)
Neutrophils Relative %: 90 %
Platelets: 279 10*3/uL (ref 150–400)
RBC: 4.07 MIL/uL — ABNORMAL LOW (ref 4.22–5.81)
RDW: 12.4 % (ref 11.5–15.5)
WBC: 27.7 10*3/uL — ABNORMAL HIGH (ref 4.0–10.5)

## 2018-03-17 LAB — LACTIC ACID, PLASMA
Lactic Acid, Venous: 2.3 mmol/L (ref 0.5–1.9)
Lactic Acid, Venous: 1.2 mmol/L (ref 0.5–1.9)

## 2018-03-17 LAB — CK TOTAL AND CKMB (NOT AT ARMC)
CK, MB: 13.1 ng/mL — ABNORMAL HIGH (ref 0.5–5.0)
Relative Index: 0.6 (ref 0.0–2.5)
Total CK: 2084 U/L — ABNORMAL HIGH (ref 49–397)

## 2018-03-17 LAB — TROPONIN I
Troponin I: 0.14 ng/mL (ref <0.03)
Troponin I: 0.15 ng/mL (ref <0.03)
Troponin I: 0.22 ng/mL (ref <0.03)

## 2018-03-17 LAB — LIPID PANEL
Cholesterol: 75 mg/dL (ref 0–200)
HDL: 13 mg/dL — ABNORMAL LOW (ref >40)
LDL Cholesterol: 39 mg/dL (ref 0–99)
Total CHOL/HDL Ratio: 5.8 RATIO
Triglycerides: 114 mg/dL (ref <150)
VLDL: 23 mg/dL (ref 0–40)

## 2018-03-17 LAB — ECHOCARDIOGRAM COMPLETE
Height: 73 in
Weight: 3449.76 oz

## 2018-03-17 LAB — BRAIN NATRIURETIC PEPTIDE: B Natriuretic Peptide: 358.5 pg/mL — ABNORMAL HIGH (ref 0.0–100.0)

## 2018-03-17 LAB — HEMOGLOBIN A1C
Hgb A1c MFr Bld: 7.6 % — ABNORMAL HIGH (ref 4.8–5.6)
Mean Plasma Glucose: 171.42 mg/dL

## 2018-03-17 LAB — PROCALCITONIN: Procalcitonin: 29.19 ng/mL

## 2018-03-17 LAB — MRSA PCR SCREENING: MRSA by PCR: NEGATIVE

## 2018-03-17 LAB — MAGNESIUM: Magnesium: 2 mg/dL (ref 1.7–2.4)

## 2018-03-17 SURGERY — IRRIGATION AND DEBRIDEMENT EXTREMITY
Anesthesia: General | Site: Foot | Laterality: Left

## 2018-03-17 MED ORDER — SODIUM CHLORIDE 0.9 % IV SOLN
INTRAVENOUS | Status: AC
  Administered 2018-03-18: 06:00:00 via INTRAVENOUS

## 2018-03-17 MED ORDER — METOCLOPRAMIDE HCL 5 MG/ML IJ SOLN: 5.0000 mg | Freq: Three times a day (TID) | INTRAMUSCULAR | Status: DC | PRN

## 2018-03-17 MED ORDER — ACETAMINOPHEN 650 MG RE SUPP
650.0000 mg | Freq: Four times a day (QID) | RECTAL | Status: DC | PRN
  Administered 2018-03-17: 650 mg via RECTAL

## 2018-03-17 MED ORDER — ONDANSETRON HCL 4 MG/2ML IJ SOLN: 4.0000 mg | Freq: Four times a day (QID) | INTRAMUSCULAR | Status: DC | PRN

## 2018-03-17 MED ORDER — CHLORHEXIDINE GLUCONATE 4 % EX LIQD
60.0000 mL | Freq: Once | CUTANEOUS | Status: DC
  Filled 2018-03-17: qty 60

## 2018-03-17 MED ORDER — SUCCINYLCHOLINE CHLORIDE 200 MG/10ML IV SOSY
PREFILLED_SYRINGE | INTRAVENOUS | Status: DC | PRN
  Administered 2018-03-17: 140 mg via INTRAVENOUS

## 2018-03-17 MED ORDER — SUFENTANIL CITRATE 50 MCG/ML IV SOLN
INTRAVENOUS | Status: AC
  Filled 2018-03-17: qty 1

## 2018-03-17 MED ORDER — SODIUM CHLORIDE 0.9 % IV BOLUS (SEPSIS)
1000.0000 mL | Freq: Once | INTRAVENOUS | Status: AC
  Administered 2018-03-17: 1000 mL via INTRAVENOUS

## 2018-03-17 MED ORDER — ONDANSETRON HCL 4 MG/2ML IJ SOLN
INTRAMUSCULAR | Status: DC | PRN
  Administered 2018-03-17: 4 mg via INTRAVENOUS

## 2018-03-17 MED ORDER — SODIUM CHLORIDE 0.9 % IV SOLN
INTRAVENOUS | Status: DC | PRN
  Administered 2018-03-17: 13:00:00 via INTRAVENOUS
  Administered 2018-03-18: 1 mL via INTRAVENOUS

## 2018-03-17 MED ORDER — ONDANSETRON HCL 4 MG/2ML IJ SOLN
INTRAMUSCULAR | Status: AC
  Filled 2018-03-17: qty 2

## 2018-03-17 MED ORDER — PROPOFOL 10 MG/ML IV BOLUS
INTRAVENOUS | Status: DC | PRN
  Administered 2018-03-17: 150 mg via INTRAVENOUS
  Administered 2018-03-17: 50 mg via INTRAVENOUS

## 2018-03-17 MED ORDER — ORAL CARE MOUTH RINSE
15.0000 mL | Freq: Two times a day (BID) | OROMUCOSAL | Status: DC
  Administered 2018-03-18 – 2018-03-19 (×4): 15 mL via OROMUCOSAL

## 2018-03-17 MED ORDER — SODIUM CHLORIDE 0.9 % IJ SOLN
INTRAMUSCULAR | Status: AC
  Filled 2018-03-17: qty 10

## 2018-03-17 MED ORDER — METOCLOPRAMIDE HCL 5 MG PO TABS: 5.0000 mg | ORAL_TABLET | Freq: Three times a day (TID) | ORAL | Status: DC | PRN

## 2018-03-17 MED ORDER — PROPOFOL 10 MG/ML IV BOLUS
INTRAVENOUS | Status: AC
  Filled 2018-03-17: qty 20

## 2018-03-17 MED ORDER — INSULIN ASPART 100 UNIT/ML ~~LOC~~ SOLN
3.0000 [IU] | SUBCUTANEOUS | Status: DC
  Administered 2018-03-17: 9 [IU] via SUBCUTANEOUS
  Administered 2018-03-17 (×2): 3 [IU] via SUBCUTANEOUS
  Administered 2018-03-18 – 2018-03-19 (×5): 6 [IU] via SUBCUTANEOUS
  Administered 2018-03-19: 3 [IU] via SUBCUTANEOUS
  Administered 2018-03-19 – 2018-03-20 (×3): 9 [IU] via SUBCUTANEOUS
  Administered 2018-03-20: 6 [IU] via SUBCUTANEOUS
  Administered 2018-03-20 (×3): 3 [IU] via SUBCUTANEOUS
  Administered 2018-03-21: 9 [IU] via SUBCUTANEOUS

## 2018-03-17 MED ORDER — VANCOMYCIN HCL IN DEXTROSE 750-5 MG/150ML-% IV SOLN
750.0000 mg | Freq: Two times a day (BID) | INTRAVENOUS | Status: DC
  Administered 2018-03-17 – 2018-03-19 (×4): 750 mg via INTRAVENOUS
  Filled 2018-03-17 (×4): qty 150

## 2018-03-17 MED ORDER — POVIDONE-IODINE 10 % EX SWAB: 2.0000 "application " | Freq: Once | CUTANEOUS | Status: DC

## 2018-03-17 MED ORDER — VANCOMYCIN HCL 10 G IV SOLR
2000.0000 mg | Freq: Once | INTRAVENOUS | Status: AC
  Administered 2018-03-17: 2000 mg via INTRAVENOUS
  Filled 2018-03-17: qty 2000

## 2018-03-17 MED ORDER — 0.9 % SODIUM CHLORIDE (POUR BTL) OPTIME
TOPICAL | Status: DC | PRN
  Administered 2018-03-17: 1000 mL

## 2018-03-17 MED ORDER — PIPERACILLIN-TAZOBACTAM 3.375 G IVPB
3.3750 g | Freq: Three times a day (TID) | INTRAVENOUS | Status: DC
  Administered 2018-03-17 – 2018-03-20 (×9): 3.375 g via INTRAVENOUS
  Filled 2018-03-17 (×9): qty 50

## 2018-03-17 MED ORDER — DOCUSATE SODIUM 100 MG PO CAPS
100.0000 mg | ORAL_CAPSULE | Freq: Two times a day (BID) | ORAL | Status: DC
  Administered 2018-03-17 – 2018-03-24 (×9): 100 mg via ORAL
  Filled 2018-03-17 (×10): qty 1

## 2018-03-17 MED ORDER — LIDOCAINE 2% (20 MG/ML) 5 ML SYRINGE
INTRAMUSCULAR | Status: DC | PRN
  Administered 2018-03-17: 100 mg via INTRAVENOUS

## 2018-03-17 MED ORDER — ONDANSETRON HCL 4 MG PO TABS: 4.0000 mg | ORAL_TABLET | Freq: Four times a day (QID) | ORAL | Status: DC | PRN

## 2018-03-17 MED ORDER — LIDOCAINE 2% (20 MG/ML) 5 ML SYRINGE
INTRAMUSCULAR | Status: AC
  Filled 2018-03-17: qty 5

## 2018-03-17 MED ORDER — SUCCINYLCHOLINE CHLORIDE 20 MG/ML IJ SOLN
INTRAMUSCULAR | Status: AC
  Filled 2018-03-17: qty 1

## 2018-03-17 MED ORDER — SODIUM CHLORIDE 0.9 % IR SOLN
Status: DC | PRN
  Administered 2018-03-17: 3000 mL

## 2018-03-17 MED ORDER — SUFENTANIL CITRATE 50 MCG/ML IV SOLN
INTRAVENOUS | Status: DC | PRN
  Administered 2018-03-17 (×2): 5 ug via INTRAVENOUS

## 2018-03-17 MED ORDER — PIPERACILLIN-TAZOBACTAM 3.375 G IVPB 30 MIN
3.3750 g | Freq: Once | INTRAVENOUS | Status: AC
  Administered 2018-03-17: 3.375 g via INTRAVENOUS
  Filled 2018-03-17: qty 50

## 2018-03-17 SURGICAL SUPPLY — 64 items
ALCOHOL 70% 16 OZ (MISCELLANEOUS) IMPLANT
BANDAGE ACE 3X5.8 VEL STRL LF (GAUZE/BANDAGES/DRESSINGS) IMPLANT
BLADE SURG 10 STRL SS (BLADE) ×2 IMPLANT
BNDG COHESIVE 1X5 TAN STRL LF (GAUZE/BANDAGES/DRESSINGS) IMPLANT
BNDG COHESIVE 4X5 TAN STRL (GAUZE/BANDAGES/DRESSINGS) IMPLANT
BNDG COHESIVE 6X5 TAN STRL LF (GAUZE/BANDAGES/DRESSINGS) ×2 IMPLANT
BNDG CONFORM 3 STRL LF (GAUZE/BANDAGES/DRESSINGS) IMPLANT
BNDG GAUZE STRTCH 6 (GAUZE/BANDAGES/DRESSINGS) IMPLANT
CORDS BIPOLAR (ELECTRODE) IMPLANT
COVER SURGICAL LIGHT HANDLE (MISCELLANEOUS) ×2 IMPLANT
CUFF TOURNIQUET SINGLE 24IN (TOURNIQUET CUFF) IMPLANT
CUFF TOURNIQUET SINGLE 34IN LL (TOURNIQUET CUFF) IMPLANT
CUFF TOURNIQUET SINGLE 44IN (TOURNIQUET CUFF) IMPLANT
DRAPE EXTREMITY BILATERAL (DRAPES) IMPLANT
DRAPE IMP U-DRAPE 54X76 (DRAPES) IMPLANT
DRAPE INCISE IOBAN 66X45 STRL (DRAPES) IMPLANT
DRAPE SURG 17X23 STRL (DRAPES) IMPLANT
DRAPE U-SHAPE 47X51 STRL (DRAPES) ×2 IMPLANT
DURAPREP 26ML APPLICATOR (WOUND CARE) IMPLANT
ELECT CAUTERY BLADE 6.4 (BLADE) ×2 IMPLANT
ELECT REM PT RETURN 9FT ADLT (ELECTROSURGICAL)
ELECTRODE REM PT RTRN 9FT ADLT (ELECTROSURGICAL) IMPLANT
FACESHIELD WRAPAROUND (MASK) IMPLANT
GAUZE SPONGE 4X4 12PLY STRL (GAUZE/BANDAGES/DRESSINGS) ×2 IMPLANT
GAUZE XEROFORM 1X8 LF (GAUZE/BANDAGES/DRESSINGS) IMPLANT
GAUZE XEROFORM 5X9 LF (GAUZE/BANDAGES/DRESSINGS) IMPLANT
GLOVE BIO SURGEON STRL SZ7.5 (GLOVE) ×2 IMPLANT
GLOVE BIOGEL PI IND STRL 8 (GLOVE) ×1 IMPLANT
GLOVE BIOGEL PI INDICATOR 8 (GLOVE) ×1
GOWN STRL REUS W/ TWL LRG LVL3 (GOWN DISPOSABLE) ×2 IMPLANT
GOWN STRL REUS W/ TWL XL LVL3 (GOWN DISPOSABLE) ×1 IMPLANT
GOWN STRL REUS W/TWL LRG LVL3 (GOWN DISPOSABLE) ×2
GOWN STRL REUS W/TWL XL LVL3 (GOWN DISPOSABLE) ×2
HANDPIECE INTERPULSE COAX TIP (DISPOSABLE) ×1
KIT BASIN OR (CUSTOM PROCEDURE TRAY) ×2 IMPLANT
KIT TURNOVER KIT B (KITS) ×2 IMPLANT
MANIFOLD NEPTUNE II (INSTRUMENTS) ×2 IMPLANT
NS IRRIG 1000ML POUR BTL (IV SOLUTION) ×4 IMPLANT
PACK ORTHO EXTREMITY (CUSTOM PROCEDURE TRAY) ×2 IMPLANT
PAD ABD 8X10 STRL (GAUZE/BANDAGES/DRESSINGS) ×2 IMPLANT
PAD ARMBOARD 7.5X6 YLW CONV (MISCELLANEOUS) ×4 IMPLANT
PADDING CAST ABS 4INX4YD NS (CAST SUPPLIES)
PADDING CAST ABS COTTON 4X4 ST (CAST SUPPLIES) IMPLANT
PADDING CAST COTTON 6X4 STRL (CAST SUPPLIES) IMPLANT
SET HNDPC FAN SPRY TIP SCT (DISPOSABLE) ×1 IMPLANT
SPONGE LAP 18X18 X RAY DECT (DISPOSABLE) IMPLANT
STOCKINETTE IMPERVIOUS 9X36 MD (GAUZE/BANDAGES/DRESSINGS) IMPLANT
SUT ETHILON 2 0 FS 18 (SUTURE) IMPLANT
SUT ETHILON 2 0 PSLX (SUTURE) IMPLANT
SUT ETHILON 3 0 PS 1 (SUTURE) IMPLANT
SUT VIC AB 2-0 CT1 36 (SUTURE) IMPLANT
SUT VIC AB 2-0 FS1 27 (SUTURE) IMPLANT
SWAB CULTURE ESWAB REG 1ML (MISCELLANEOUS) IMPLANT
SWAB CULTURE LIQ STUART DBL (MISCELLANEOUS) ×2 IMPLANT
SYR CONTROL 10ML LL (SYRINGE) IMPLANT
TOWEL OR 17X24 6PK STRL BLUE (TOWEL DISPOSABLE) ×2 IMPLANT
TOWEL OR 17X26 10 PK STRL BLUE (TOWEL DISPOSABLE) ×2 IMPLANT
TUBE ANAEROBIC SPECIMEN COL (MISCELLANEOUS) ×2 IMPLANT
TUBE CONNECTING 12X1/4 (SUCTIONS) ×2 IMPLANT
TUBE FEEDING ENTERAL 5FR 16IN (TUBING) IMPLANT
TUBING CYSTO DISP (UROLOGICAL SUPPLIES) IMPLANT
UNDERPAD 30X30 (UNDERPADS AND DIAPERS) ×4 IMPLANT
WATER STERILE IRR 1000ML POUR (IV SOLUTION) IMPLANT
YANKAUER SUCT BULB TIP NO VENT (SUCTIONS) ×2 IMPLANT

## 2018-03-17 NOTE — Progress Notes (Signed)
OT Cancellation Note  Patient Details Name: Kevin [name] MRN: [id number] DOB: [birth year]   Cancelled Treatment:    Reason Eval/Treat Not Completed: Active bedrest order  Almon Register 03/17/2018, 7:19 AM

## 2018-03-17 NOTE — Brief Op Note (Signed)
03/16/2018 - 03/17/2018  8:46 PM  PATIENT:  Kevin [name]  77 y.o. male  PRE-OPERATIVE DIAGNOSIS:  irrigation and debridment  POST-OPERATIVE DIAGNOSIS:  * No post-op diagnosis entered *  PROCEDURE:  Procedure(s): IRRIGATION AND DEBRIDEMENT FOOT (Left)  SURGEON:  Surgeon(s) and Role:    * [name], MD - Primary  PHYSICIAN ASSISTANT:   ASSISTANTS: none   ANESTHESIA:   general  EBL:  10 cc   BLOOD ADMINISTERED:none  DRAINS: none   LOCAL MEDICATIONS USED:  NONE  SPECIMEN:  Source of Specimen:  soft tissue of left plantar foot, and 2nd metatarsal bone  DISPOSITION OF SPECIMEN:  PATHOLOGY  COUNTS:  YES  TOURNIQUET:  * Missing tourniquet times found for documented tourniquets in log: [id number] *  DICTATION: .Note written in EPIC  PLAN OF CARE: Admit to inpatient   PATIENT DISPOSITION:  ICU - extubated and stable.   Delay start of Pharmacological VTE agent (>24hrs) due to surgical blood loss or risk of bleeding: not applicable

## 2018-03-17 NOTE — Anesthesia Postprocedure Evaluation (Signed)
Anesthesia Post Note  Patient: Kevin [name]  Procedure(s) Performed: IRRIGATION AND DEBRIDEMENT FOOT (Left Foot)     Patient location during evaluation: PACU Anesthesia Type: General Level of consciousness: awake and alert Pain management: pain level controlled Vital Signs Assessment: post-procedure vital signs reviewed and stable Respiratory status: spontaneous breathing, nonlabored ventilation, respiratory function stable and patient connected to nasal cannula oxygen Cardiovascular status: blood pressure returned to baseline and stable Postop Assessment: no apparent nausea or vomiting Anesthetic complications: no    Last Vitals:  Vitals:   03/17/18 2200 03/17/18 2230  BP: (!) 160/80 (!) 150/116  Pulse: (!) 56 (!) 39  Resp: (!) 33 (!) 41  Temp:    SpO2: 100% 95%    Last Pain:  Vitals:   03/17/18 2130  TempSrc:   PainSc: 0-No pain                 JOSLIN,DAVID COKER

## 2018-03-17 NOTE — Evaluation (Signed)
Speech Language Pathology Evaluation Patient Details Name: Kevin [name] MRN: [id number] DOB: [birth year] Today's Date: 03/17/2018 Time: 4431-5400 SLP Time Calculation (min) (ACUTE ONLY): 24 min  Problem List:  Patient Active Problem List   Diagnosis Date Noted  . 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 Strain of left tibialis anterior muscle 12/19/2015  . 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 Fracture of radius, distal, left, closed 12/19/2012   Past Medical History:  Past Medical History:  Diagnosis Date  . Diabetes mellitus without complication ([place])   . Hypertension    Past Surgical History:  Past Surgical History:  Procedure Laterality Date  . WRIST SURGERY     HPI:  Kevin [name] is an 77 y.o. male with a PMHx of DM and HTN who presents with acute onset of left sided weakness, left facial droop and dysphasia. LKN was 1845. He wqas found laying on his floor with expressive aphasia and no LUE movement. Also with left sensory loss and confusion Pt administered TPA with resolution of most symptoms; c/o some left arm weakness only; wife stated his aphasia has resolved since initial admission on 03/15/18. Passed NSSS; 03/16/18 CT head indicated Negative for age. No acute cortically based infarct or intracranial hemorrhage is identified; MRI brain pending  Assessment / Plan / Recommendation Clinical Impression   Pt administered the [facility] Cognitive Assessment) with an overall score obtained within functional limits (26/30) with 3/5 words recalled for delayed memory task, but pt able to recall with category verbal cues; naming/repetition tasks all WNL; speech intelligible 100% within conversation; oriented x4 with detailed information provided regarding safety/awareness of deficits, past medical hx; visual deficits potentially with writing tasks warranting OT consult to assess further; reading [facility] for environmental signs/functional reading tasks; pt wears  glasses and has increased anxiety which may contribute to testing efficacy.  ST will s/o in acute setting, but if pt/family feels he would benefit from further assessment when D/C, OP SLP may be an option to assess cognition further prn.    SLP Assessment  SLP Recommendation/Assessment: All further Speech Language Pathology  needs can be addressed in the next venue of care SLP Visit Diagnosis: Other (if any residual deficits exist that are not pt's baseline)    Follow Up Recommendations  Outpatient SLP;Other (comment)(prn)    Frequency and Duration   n/a        SLP Evaluation Cognition  Overall Cognitive Status: Within Functional Limits for tasks assessed Arousal/Alertness: Awake/alert Orientation Level: Oriented X4 Memory: Appears intact(Pt at baseline) Awareness: Appears intact Problem Solving: Appears intact Behaviors: Other (comment)(anxious) Safety/Judgment: Appears intact       Comprehension  Auditory Comprehension Overall Auditory Comprehension: Appears within functional limits for tasks assessed Conversation: Complex Visual Recognition/Discrimination Discrimination: Within Function Limits Reading Comprehension Reading Status: Not tested    Expression Expression Primary Mode of Expression: Verbal Verbal Expression Overall Verbal Expression: Appears within functional limits for tasks assessed Initiation: No impairment Level of Generative/Spontaneous Verbalization: Conversation Repetition: No impairment Naming: No impairment Pragmatics: No impairment Interfering Components: Other (comment)(anxiety) Non-Verbal Means of Communication: Not applicable Written Expression Dominant Hand: Right Written Expression: Within Functional Limits   Oral / Motor  Oral Motor/Sensory Function Overall Oral Motor/Sensory Function: Within functional limits Motor Speech Overall Motor Speech: Appears within functional limits for tasks assessed Respiration: Within functional  limits Phonation: Normal Resonance: Within functional limits Articulation: Within functional limitis Intelligibility: Intelligible Motor Planning: Witnin functional limits Motor Speech Errors: Not applicable  Elvina Sidle, M.S., CCC-SLP 03/17/2018, 11:26 AM

## 2018-03-17 NOTE — Consult Note (Signed)
[facility]  Total days of antibiotics 1               Reason for Consult: possible osteomyelitis   Referring Physician: lindzen  Active Problems:   Stroke (cerebrum) ([place])    HPI: Kevin [name] is a 77 y.o. male with history of HTN, DM, who had a calloused left foot over the past month but most recently having drainage and swelling. On the day of admit, his wife reports he probably had fecal contamination of his open wound of foot since he had diarrhea. He was principally admitted for new onset left sided weakness and aphasia presumably new onset stroke with no signs of hemorrhage on head CT - given tPA at 2100 with neurologic improvement. Overnight, having increasing confusion, tachycardia, and worsening leukocytosis of 27K. On exam, having foul drainage from plantar aspect of left foot with surrounding swelling of left foot and erythema to dorsum of foot. Wife reports that she is unsure how long it had been there. He is insensate due to injury (predates DM diagnosis), has had callous there, works out but only draining in the past week or so - she believes  He used to work for a [employer] and has traveled to many countries and different areas of the [place]. He thinks he is in [place] currently  Past Medical History:  Diagnosis Date  . Diabetes mellitus without complication ([place])   . Hypertension     Allergies: No Known Allergies   MEDICATIONS: . FLUoxetine  20 mg Oral Daily  . insulin aspart  3-9 Units Subcutaneous Q4H  . labetalol  20 mg Intravenous Once  . mesalamine  0.75 g Oral BID  . metoprolol succinate  100 mg Oral Daily  . rosuvastatin  20 mg Oral q1800    Social History   Tobacco Use  . Smoking status: Never Smoker  . Smokeless tobacco: Never Used  Substance Use Topics  . Alcohol use: Yes  . Drug use: Never    Family History  Problem Relation Age of Onset  . Hypertension Father     Review of Systems   Unable to obtain  due to confusion    OBJECTIVE: Temp:  [97.8 F (36.6 C)-100.3 F (37.9 C)] 100.3 F (37.9 C) (06/21 1200) Pulse Rate:  [82-110] 93 (06/21 1300) Resp:  [14-36] 32 (06/21 1300) BP: (97-154)/(60-96) 136/68 (06/21 1300) SpO2:  [93 %-100 %] 97 % (06/21 1300) Weight:  [215 lb 9.8 oz (97.8 kg)] 215 lb 9.8 oz (97.8 kg) (06/20 2113) Physical Exam  Constitutional: He is oriented to person, only. He appears well-developed and well-nourished. No distress.  HENT:  Mouth/Throat: Oropharynx is clear and moist. No oropharyngeal exudate.  Cardiovascular: Normal rate, regular rhythm and normal heart sounds. Exam reveals no gallop and no friction rub.  No murmur heard.  Pulmonary/Chest: Effort normal and breath sounds normal. No respiratory distress. He has no wheezes.  Abdominal: Soft. Bowel sounds are normal. He exhibits no distension. There is no tenderness.  Lymphadenopathy:  He has no cervical adenopathy.  Neurological: He is alert and oriented to person, only. Moves all extremities. 4/5 strength grip strength equal Skin: Skin has surrounding erythema to dorsum of left foot. Plantar aspect has quarter size drainage from wound. Psychiatric: He has a normal mood and affect. Hie exhibits confusion    LABS: Results for orders placed or performed during the hospital encounter of 03/16/18 (from the past 48 hour(s))  Glucose, capillary  Status: Abnormal   Collection Time: 03/16/18  8:31 PM  Result Value Ref Range   Glucose-Capillary 311 (H) 65 - 99 mg/dL  Ethanol     Status: None   Collection Time: 03/16/18  8:32 PM  Result Value Ref Range   Alcohol, Ethyl (B) <10 <10 mg/dL    Comment: (NOTE) Lowest detectable limit for serum alcohol is 10 mg/dL. For medical purposes only. Performed at [facility], [place] [address] [place], [place] [address]   Protime-INR     Status: Abnormal   Collection Time: 03/16/18  8:32 PM  Result Value Ref Range   Prothrombin Time 15.4 (H) 11.4 - 15.2  seconds   INR 1.23     Comment: Performed at [facility] [address], [place], [place] [address]  APTT     Status: None   Collection Time: 03/16/18  8:32 PM  Result Value Ref Range   aPTT 32 24 - 36 seconds    Comment: Performed at [facility] [address], [place], [place] [address]  CBC     Status: Abnormal   Collection Time: 03/16/18  8:32 PM  Result Value Ref Range   WBC 25.9 (H) 4.0 - 10.5 K/uL   RBC 4.48 4.22 - 5.81 MIL/uL   Hemoglobin 13.1 13.0 - 17.0 g/dL   HCT 39.4 39.0 - 52.0 %   MCV 87.9 78.0 - 100.0 fL   MCH 29.2 26.0 - 34.0 pg   MCHC 33.2 30.0 - 36.0 g/dL   RDW 12.3 11.5 - 15.5 %   Platelets 274 150 - 400 K/uL    Comment: Performed at [facility], [place] [address], [place], [place] [address]  Differential     Status: Abnormal   Collection Time: 03/16/18  8:32 PM  Result Value Ref Range   Neutrophils Relative % 92 %   Lymphocytes Relative 3 %   Monocytes Relative 5 %   Eosinophils Relative 0 %   Basophils Relative 0 %   Neutro Abs 23.8 (H) 1.7 - 7.7 K/uL   Lymphs Abs 0.8 0.7 - 4.0 K/uL   Monocytes Absolute 1.3 (H) 0.1 - 1.0 K/uL   Eosinophils Absolute 0.0 0.0 - 0.7 K/uL   Basophils Absolute 0.0 0.0 - 0.1 K/uL   WBC Morphology MILD LEFT SHIFT (1-5% METAS, OCC MYELO, OCC BANDS)     Comment: Performed at [facility] [address], [place], [place] [address]  Comprehensive metabolic panel     Status: Abnormal   Collection Time: 03/16/18  8:32 PM  Result Value Ref Range   Sodium 126 (L) 135 - 145 mmol/L   Potassium 4.7 3.5 - 5.1 mmol/L   Chloride 90 (L) 101 - 111 mmol/L   CO2 18 (L) 22 - 32 mmol/L   Glucose, Bld 304 (H) 65 - 99 mg/dL   BUN 33 (H) 6 - 20 mg/dL   Creatinine, Ser 2.17 (H) 0.61 - 1.24 mg/dL   Calcium 8.3 (L) 8.9 - 10.3 mg/dL   Total Protein 6.8 6.5 - 8.1 g/dL   Albumin 2.7 (L) 3.5 - 5.0 g/dL   AST 62 (H) 15 - 41 U/L   ALT 46 17 - 63 U/L   Alkaline Phosphatase 117 38 - 126 U/L   Total Bilirubin 1.1 0.3 - 1.2  mg/dL   GFR calc non Af Amer 28 (L) >60 mL/min   GFR calc Af Amer 33 (L) >60 mL/min  Comment: (NOTE) The eGFR has been calculated using the CKD EPI equation. This calculation has not been validated in all clinical situations. eGFR's persistently <60 mL/min signify possible Chronic Kidney Disease.    Anion gap 18 (H) 5 - 15    Comment: Performed at [facility], [place] [address], [place], [place] [address]  I-Stat Chem 8, ED     Status: Abnormal   Collection Time: 03/16/18  8:39 PM  Result Value Ref Range   Sodium 124 (L) 135 - 145 mmol/L   Potassium 4.4 3.5 - 5.1 mmol/L   Chloride 90 (L) 101 - 111 mmol/L   BUN 32 (H) 6 - 20 mg/dL   Creatinine, Ser 1.90 (H) 0.61 - 1.24 mg/dL   Glucose, Bld 312 (H) 65 - 99 mg/dL   Calcium, Ion 0.95 (L) 1.15 - 1.40 mmol/L   TCO2 18 (L) 22 - 32 mmol/L   Hemoglobin 13.6 13.0 - 17.0 g/dL   HCT 40.0 39.0 - 52.0 %  I-stat troponin, ED     Status: None   Collection Time: 03/16/18  8:42 PM  Result Value Ref Range   Troponin i, poc 0.08 0.00 - 0.08 ng/mL   Comment 3            Comment: Due to the release kinetics of cTnI, a negative result within the first hours of the onset of symptoms does not rule out myocardial infarction with certainty. If myocardial infarction is still suspected, repeat the test at appropriate intervals.   Urine rapid drug screen (hosp performed)     Status: Abnormal   Collection Time: 03/16/18  9:25 PM  Result Value Ref Range   Opiates NONE DETECTED NONE DETECTED   Cocaine NONE DETECTED NONE DETECTED   Benzodiazepines NONE DETECTED NONE DETECTED   Amphetamines NONE DETECTED NONE DETECTED   Tetrahydrocannabinol NONE DETECTED NONE DETECTED   Barbiturates (A) NONE DETECTED    Result not available. Reagent lot number recalled by manufacturer.    Comment: Performed at [facility], [place] [address] [place], [place] [address]  Urinalysis, Routine w reflex microscopic     Status: Abnormal   Collection Time: 03/16/18   9:25 PM  Result Value Ref Range   Color, Urine YELLOW YELLOW   APPearance HAZY (A) CLEAR   Specific Gravity, Urine 1.011 1.005 - 1.030   pH 6.0 5.0 - 8.0   Glucose, UA >=500 (A) NEGATIVE mg/dL   Hgb urine dipstick LARGE (A) NEGATIVE   Bilirubin Urine NEGATIVE NEGATIVE   Ketones, ur 5 (A) NEGATIVE mg/dL   Protein, ur 30 (A) NEGATIVE mg/dL   Nitrite NEGATIVE NEGATIVE   Leukocytes, UA LARGE (A) NEGATIVE   RBC / HPF 6-10 0 - 5 RBC/hpf   WBC, UA 11-20 0 - 5 WBC/hpf   Bacteria, UA RARE (A) NONE SEEN   Squamous Epithelial / LPF 0-5 0 - 5    Comment: Performed at [facility], [place] [address] [place], [place] [address]  MRSA PCR Screening     Status: None   Collection Time: 03/16/18 10:37 PM  Result Value Ref Range   MRSA by PCR NEGATIVE NEGATIVE    Comment:        The GeneXpert MRSA Assay (FDA approved for NASAL specimens only), is one component of a comprehensive MRSA colonization surveillance program. It is not intended to diagnose MRSA infection nor to guide or monitor treatment for MRSA infections. Performed at [facility], [place]  [address], [place], [place] [address]   CK total and CKMB (cardiac)not at [facility]     Status: Abnormal   Collection Time: 03/16/18 11:36 PM  Result Value Ref Range   Total CK 2,084 (H) 49 - 397 U/L   CK, MB 13.1 (H) 0.5 - 5.0 ng/mL   Relative Index 0.6 0.0 - 2.5    Comment: Performed at [facility] [address] [place], [place] [address]  Troponin I (q 6hr x 3)     Status: Abnormal   Collection Time: 03/16/18 11:36 PM  Result Value Ref Range   Troponin I 0.22 (HH) <0.03 ng/mL    Comment: CRITICAL RESULT CALLED TO, READ BACK BY AND VERIFIED WITH: BARRIER D,RN 03/17/18 0059 WAYK Performed at [facility], [place] [address] [place], [place] [address]   Hemoglobin A1c     Status: Abnormal   Collection Time: 03/17/18  3:39 AM  Result Value Ref Range   Hgb A1c MFr Bld 7.6 (H) 4.8 - 5.6 %    Comment: (NOTE) Pre diabetes:           5.7%-6.4% Diabetes:              >6.4% Glycemic control for   <7.0% adults with diabetes    Mean Plasma Glucose 171.42 mg/dL    Comment: Performed at [facility] [address], [name],  [address]  Lipid panel     Status: Abnormal   Collection Time: 03/17/18  3:39 AM  Result Value Ref Range   Cholesterol 75 0 - 200 mg/dL   Triglycerides 114 <150 mg/dL   HDL 13 (L) >40 mg/dL   Total CHOL/HDL Ratio 5.8 RATIO   VLDL 23 0 - 40 mg/dL   LDL Cholesterol 39 0 - 99 mg/dL    Comment:        Total Cholesterol/HDL:CHD Risk Coronary Heart Disease Risk Table                     Men   Women  1/2 Average Risk   3.4   3.3  Average Risk       5.0   4.4  2 X Average Risk   9.6   7.1  3 X Average Risk  23.4   11.0        Use the calculated Patient Ratio above and the CHD Risk Table to determine the patient's CHD Risk.        ATP III CLASSIFICATION (LDL):  <100     mg/dL   Optimal  100-129  mg/dL   Near or Above                    Optimal  130-159  mg/dL   Borderline  160-189  mg/dL   High  >190     mg/dL   Very High Performed at [facility] [address], [place], [place] [address]   Glucose, capillary     Status: Abnormal   Collection Time: 03/17/18  7:27 AM  Result Value Ref Range   Glucose-Capillary 205 (H) 65 - 99 mg/dL  CBC with Differential/Platelet     Status: Abnormal   Collection Time: 03/17/18  8:46 AM  Result Value Ref Range   WBC 27.7 (H) 4.0 - 10.5 K/uL   RBC 4.07 (L) 4.22 - 5.81 MIL/uL   Hemoglobin 11.9 (L) 13.0 - 17.0 g/dL   HCT 35.5 (L) 39.0 - 52.0 %  MCV 87.2 78.0 - 100.0 fL   MCH 29.2 26.0 - 34.0 pg   MCHC 33.5 30.0 - 36.0 g/dL   RDW 12.4 11.5 - 15.5 %   Platelets 279 150 - 400 K/uL   Neutrophils Relative % 90 %   Neutro Abs 25.0 (H) 1.7 - 7.7 K/uL   Lymphocytes Relative 4 %   Lymphs Abs 1.0 0.7 - 4.0 K/uL   Monocytes Relative 5 %   Monocytes Absolute 1.4 (H) 0.1 - 1.0 K/uL   Eosinophils Relative 0 %   Eosinophils Absolute 0.0  0.0 - 0.7 K/uL   Basophils Relative 0 %   Basophils Absolute 0.1 0.0 - 0.1 K/uL   Immature Granulocytes 1 %   Abs Immature Granulocytes 0.2 (H) 0.0 - 0.1 K/uL    Comment: Performed at [facility] [address], [place], [place] [address]  Glucose, capillary     Status: Abnormal   Collection Time: 03/17/18 11:45 AM  Result Value Ref Range   Glucose-Capillary 276 (H) 65 - 99 mg/dL  Lactic acid, plasma     Status: Abnormal   Collection Time: 03/17/18 11:48 AM  Result Value Ref Range   Lactic Acid, Venous 2.3 (HH) 0.5 - 1.9 mmol/L    Comment: CRITICAL RESULT CALLED TO, READ BACK BY AND VERIFIED WITH: WASHBURN,S RN @ 7414 03/17/18 LEONARD,A Performed at [facility], [place] [address], [place], [place] [address]   Troponin I (q 6hr x 3)     Status: Abnormal   Collection Time: 03/17/18 11:48 AM  Result Value Ref Range   Troponin I 0.15 (HH) <0.03 ng/mL    Comment: CRITICAL VALUE NOTED.  VALUE IS CONSISTENT WITH PREVIOUSLY REPORTED AND CALLED VALUE. Performed at [facility], [place] [address], [place], [place] [address]   Renal function panel     Status: Abnormal   Collection Time: 03/17/18 11:48 AM  Result Value Ref Range   Sodium 126 (L) 135 - 145 mmol/L   Potassium 3.9 3.5 - 5.1 mmol/L   Chloride 89 (L) 101 - 111 mmol/L   CO2 24 22 - 32 mmol/L   Glucose, Bld 292 (H) 65 - 99 mg/dL   BUN 32 (H) 6 - 20 mg/dL   Creatinine, Ser 1.71 (H) 0.61 - 1.24 mg/dL   Calcium 8.2 (L) 8.9 - 10.3 mg/dL   Phosphorus 3.2 2.5 - 4.6 mg/dL   Albumin 2.6 (L) 3.5 - 5.0 g/dL   GFR calc non Af Amer 38 (L) >60 mL/min   GFR calc Af Amer 44 (L) >60 mL/min    Comment: (NOTE) The eGFR has been calculated using the CKD EPI equation. This calculation has not been validated in all clinical situations. eGFR's persistently <60 mL/min signify possible Chronic Kidney Disease.    Anion gap 13 5 - 15    Comment: Performed at [facility] [address], [place], [place] [address]  Magnesium      Status: None   Collection Time: 03/17/18 11:48 AM  Result Value Ref Range   Magnesium 2.0 1.7 - 2.4 mg/dL    Comment: Performed at [facility] [address] [place], [place] [address]  Brain natriuretic peptide     Status: Abnormal   Collection Time: 03/17/18 11:48 AM  Result Value Ref Range   B Natriuretic Peptide 358.5 (H) 0.0 - 100.0 pg/mL    Comment: Performed at [facility] [address] [place], [place] [address]  MICRO:  IMAGING: Ct Angio Head W Or Wo Contrast  Result Date: 03/16/2018 CLINICAL DATA:  77 year old male code stroke, left side symptoms. EXAM: CT ANGIOGRAPHY HEAD AND NECK TECHNIQUE: Multidetector CT imaging of the head and neck was performed using the standard protocol during bolus administration of intravenous contrast. Multiplanar CT image reconstructions and MIPs were obtained to evaluate the vascular anatomy. Carotid stenosis measurements (when applicable) are obtained utilizing NASCET criteria, using the distal internal carotid diameter as the denominator. CONTRAST:  84m ISOVUE-370 IOPAMIDOL (ISOVUE-370) INJECTION 76% COMPARISON:  Head CT without contrast 2041 hours today. FINDINGS: CTA NECK Skeleton: Advanced cervical spine degeneration, especially left side facet arthropathy. Maxillary sinus mucoperiosteal thickening redemonstrated, greater on the left. Dental inflammatory periapical lucency occasionally noted. Chronic distal right clavicle fracture. No acute osseous abnormality identified. Upper chest: Centrilobular emphysema. No superior mediastinal lymphadenopathy. Other neck: Negative.  No neck mass or lymphadenopathy. Aortic arch: Calcified aortic atherosclerosis. 3 vessel arch configuration. Right carotid system: No brachiocephalic artery or right CCA origin stenosis despite atherosclerosis. Intermittent calcified plaque in the right CCA proximal to the bifurcation without stenosis. Mostly calcified plaque at the right carotid bifurcation and  right ICA origin. Less than 50 % stenosis with respect to the distal vessel. Left carotid system: No left CCA origin stenosis despite atherosclerosis. Intermittent calcified plaque in the left CCA without stenosis. Mostly calcified plaque at the left carotid bifurcation involving the left ICA origin and bulb. Maximal stenosis is at the distal bulb level estimated at 50 % with respect to the distal vessel (series 8, image 122). Vertebral arteries: No significant proximal right subclavian artery stenosis despite bulky calcified plaque. No right vertebral artery origin stenosis. Negative right vertebral artery to the skull base. No proximal left subclavian artery stenosis despite atherosclerosis. Calcified plaque at the left vertebral artery origin resulting in moderate to severe stenosis as seen on series 9, image 129. Intermittent left V1 calcified plaque. But no other occasional left V2 and V3 calcified plaque significant stenosis to the skull base. CTA HEAD Posterior circulation: Patent distal vertebral arteries without stenosis. The left is mildly dominant. Mild left V4 calcified plaque. Patent right PICA origin. Dominant left AICA is patent. Patent vertebrobasilar junction. Patent basilar artery is diminutive, but without stenosis. SCA and PCA origins are patent. Posterior communicating arteries are diminutive or absent. No PCA stenosis or branch occlusion identified. Anterior circulation: Both ICA siphons are patent. Mild to moderate left siphon calcified plaque with only mild left cavernous segment stenosis. Mild to moderate right siphon calcified plaque. Mild right siphon supraclinoid segment stenosis. Patent carotid termini. Normal MCA and ACA origins. Anterior communicating artery is normal. The left ACA appears somewhat dominant throughout. Left MCA M1 segment, bifurcation, and left MCA branches are within normal limits. Right MCA M1 segment is patent with mild irregularity but no high-grade stenosis. Right  MCA bifurcation and right MCA branches are within normal limits. Venous sinuses: Not evaluated due to early contrast phase. Anatomic variants: Mildly dominant left vertebral artery, left ACA. Review of the MIP images confirms the above findings IMPRESSION: 1. Negative for large vessel occlusion. This was preliminarily discussed by telephone with Dr. EKerney Elbeon 03/16/2018 at 2114 hours. 2. Positive for atherosclerosis in the head and neck, and Aortic Atherosclerosis (ICD10-I70.0). 3. 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 No hemodynamically significant carotid or right vertebral stenosis. 4.  Emphysema (ICD10-J43.9). Electronically Signed   By: HGenevie AnnM.D.   On:  03/16/2018 21:25   Ct Angio Neck W Or Wo Contrast  Result Date: 03/16/2018 CLINICAL DATA:  77 year old male code stroke, left side symptoms. EXAM: CT ANGIOGRAPHY HEAD AND NECK TECHNIQUE: Multidetector CT imaging of the head and neck was performed using the standard protocol during bolus administration of intravenous contrast. Multiplanar CT image reconstructions and MIPs were obtained to evaluate the vascular anatomy. Carotid stenosis measurements (when applicable) are obtained utilizing NASCET criteria, using the distal internal carotid diameter as the denominator. CONTRAST:  64m ISOVUE-370 IOPAMIDOL (ISOVUE-370) INJECTION 76% COMPARISON:  Head CT without contrast 2041 hours today. FINDINGS: CTA NECK Skeleton: Advanced cervical spine degeneration, especially left side facet arthropathy. Maxillary sinus mucoperiosteal thickening redemonstrated, greater on the left. Dental inflammatory periapical lucency occasionally noted. Chronic distal right clavicle fracture. No acute osseous abnormality identified. Upper chest: Centrilobular emphysema. No superior mediastinal lymphadenopathy. Other neck: Negative.  No neck mass or lymphadenopathy. Aortic arch: Calcified aortic atherosclerosis. 3 vessel arch  configuration. Right carotid system: No brachiocephalic artery or right CCA origin stenosis despite atherosclerosis. Intermittent calcified plaque in the right CCA proximal to the bifurcation without stenosis. Mostly calcified plaque at the right carotid bifurcation and right ICA origin. Less than 50 % stenosis with respect to the distal vessel. Left carotid system: No left CCA origin stenosis despite atherosclerosis. Intermittent calcified plaque in the left CCA without stenosis. Mostly calcified plaque at the left carotid bifurcation involving the left ICA origin and bulb. Maximal stenosis is at the distal bulb level estimated at 50 % with respect to the distal vessel (series 8, image 122). Vertebral arteries: No significant proximal right subclavian artery stenosis despite bulky calcified plaque. No right vertebral artery origin stenosis. Negative right vertebral artery to the skull base. No proximal left subclavian artery stenosis despite atherosclerosis. Calcified plaque at the left vertebral artery origin resulting in moderate to severe stenosis as seen on series 9, image 129. Intermittent left V1 calcified plaque. But no other occasional left V2 and V3 calcified plaque significant stenosis to the skull base. CTA HEAD Posterior circulation: Patent distal vertebral arteries without stenosis. The left is mildly dominant. Mild left V4 calcified plaque. Patent right PICA origin. Dominant left AICA is patent. Patent vertebrobasilar junction. Patent basilar artery is diminutive, but without stenosis. SCA and PCA origins are patent. Posterior communicating arteries are diminutive or absent. No PCA stenosis or branch occlusion identified. Anterior circulation: Both ICA siphons are patent. Mild to moderate left siphon calcified plaque with only mild left cavernous segment stenosis. Mild to moderate right siphon calcified plaque. Mild right siphon supraclinoid segment stenosis. Patent carotid termini. Normal MCA and ACA  origins. Anterior communicating artery is normal. The left ACA appears somewhat dominant throughout. Left MCA M1 segment, bifurcation, and left MCA branches are within normal limits. Right MCA M1 segment is patent with mild irregularity but no high-grade stenosis. Right MCA bifurcation and right MCA branches are within normal limits. Venous sinuses: Not evaluated due to early contrast phase. Anatomic variants: Mildly dominant left vertebral artery, left ACA. Review of the MIP images confirms the above findings IMPRESSION: 1. Negative for large vessel occlusion. This was preliminarily discussed by telephone with Dr. EKerney Elbeon 03/16/2018 at 2114 hours. 2. Positive for atherosclerosis in the head and neck, and Aortic Atherosclerosis (ICD10-I70.0). 3. 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 No hemodynamically significant carotid or right vertebral stenosis. 4.  Emphysema (ICD10-J43.9). Electronically Signed   By: HHerminio HeadsD.  On: 03/16/2018 21:25   Dg Chest Port 1 View  Result Date: 03/17/2018 CLINICAL DATA:  Respiratory distress EXAM: PORTABLE CHEST 1 VIEW COMPARISON:  10/20/2014 FINDINGS: Cardiac shadows within normal limits. Aortic calcifications are again seen. The lungs are well aerated bilaterally. No focal infiltrate or sizable effusion is noted. No acute bony abnormality is seen. IMPRESSION: No active disease. Electronically Signed   By: Inez Catalina M.D.   On: 03/17/2018 12:35   Dg Foot 2 Views Left  Result Date: 03/17/2018 CLINICAL DATA:  Cellulitis of foot EXAM: LEFT FOOT - 2 VIEW COMPARISON:  None. FINDINGS: Changes consistent with prior fifth metatarsal fracture with healing are noted. Degenerative changes are noted at the first metatarsal tarsal articulation. Diffuse tarsal degenerative changes are seen. No acute fracture or dislocation is noted. No soft tissue abnormality is seen. IMPRESSION: Chronic changes without acute abnormality.  Electronically Signed   By: Inez Catalina M.D.   On: 03/17/2018 12:36   Ct Head Code Stroke Wo Contrast  Result Date: 03/16/2018 CLINICAL DATA:  Code stroke. 77 year old male with left side weakness and facial droop last seen normal 1840 hours. EXAM: CT HEAD WITHOUT CONTRAST TECHNIQUE: Contiguous axial images were obtained from the base of the skull through the vertex without intravenous contrast. COMPARISON:  None. FINDINGS: Brain: No midline shift, ventriculomegaly, mass effect, evidence of mass lesion, intracranial hemorrhage or evidence of cortically based acute infarction. No cortical encephalomalacia identified. Bilateral gray-white matter differentiation is within normal limits for age. Vascular: Calcified atherosclerosis at the skull base. No suspicious intracranial vascular hyperdensity. Skull: Negative. Sinuses/Orbits: Maxillary mucoperiosteal thickening greater on the left. Other paranasal sinuses are well pneumatized. Tympanic cavities and mastoids are clear. Other: Visualized orbit soft tissues are within normal limits. Visualized scalp soft tissues are within normal limits. ASPECTS [facility] Stroke Program Early CT Score) - Ganglionic level infarction (caudate, lentiform nuclei, internal capsule, insula, M1-M3 cortex): 7 - Supraganglionic infarction (M4-M6 cortex): 3 Total score (0-10 with 10 being normal): 10 IMPRESSION: 1. Negative for age. No acute cortically based infarct or intracranial hemorrhage is identified. 2. ASPECTS is 10. 3. Chronic maxillary sinusitis. 4. These results were communicated to Dr. Cheral Marker at 8:50 pmon 6/20/2019by text page via the [facility] messaging system. Electronically Signed   By: Genevie Ann M.D.   On: 03/16/2018 20:51   Assessment/Plan:  77yo M admitted for left sided weakness and expressive aphasia found to have fevers, leukocytosis and draining ulcer to left foot. His wife reports some mild confusion/diarrhea/drainage of foot wound on the morning of admit that makes me  think he was having evidence of infection prior to neurologic symptoms.  - agree with broad coverage of vancomycin plus piptazo - patient is planned for surgical debridement. - PLEASE SEND MORE TISSUE FOR AEROBIC/ANAEROBIC CULTURE - may need prolonged course and treat as presumed deep tissue vs. osteo based upon OR findings - recommend checkind sed rate and crp   Diarrhea = recommend ruling out c.difficile

## 2018-03-17 NOTE — Progress Notes (Addendum)
Pharmacy Antibiotic Note  Kevin [name] is a 76 y.o. male admitted on 03/16/2018 with wound infection, possible osteomyelitis.  Pharmacy has been consulted for vancomycin/zosyn dosing. Afeb, WBC up to 27.7. SCr 1.9 on admit, CrCl~40-45. SCr for today pending.  Plan: Zosyn 3.375g IV (60min infusion) x1; then 3.375g IV q8h (4h infusion) Vancomycin 2g IV x1; then 750mg  IV q12h Monitor clinical progress, c/s, renal function F/u de-escalation plan/LOT, vancomycin trough as indicated F/u SCr today and adjust maintenance dosing as needed  ADDENDUM:  SCr today resulted as decreased to 1.71, CrCl~45-50. Vancomycin dose remains appropriate.  Height: 6\' 1"  (185.4 cm) Weight: 215 lb 9.8 oz (97.8 kg) IBW/kg (Calculated) : 79.9  Temp (24hrs), Avg:98.4 F (36.9 C), Min:97.8 F (36.6 C), Max:99.1 F (37.3 C)  Recent Labs  Lab 03/16/18 2032 03/16/18 2039 03/17/18 0846  WBC 25.9*  --  27.7*  CREATININE 2.17* 1.90*  --     Estimated Creatinine Clearance: 42 mL/min (A) (by C-G formula based on SCr of 1.9 mg/dL (H)).    No Known Allergies  Antimicrobials this admission: 6/21 vancomycin >>  6/21 zosyn >>   Dose adjustments this admission:   Microbiology results:   Elicia Lamp, PharmD, BCPS Clinical Pharmacist 03/17/2018 11:39 AM

## 2018-03-17 NOTE — Consult Note (Addendum)
Cardiology Consult    Patient ID: Kevin [name] MRN: [id number], DOB/AGE: [birth year]   Admit date: 03/16/2018 Date of Consult: 03/17/2018  Primary Physician: [name], MD Primary Cardiologist: New Requesting Provider: Dr. Cheral Marker Reason for Consultation: Freq PVCs  Kevin [name] is a 77 y.o. male who is being seen today for the evaluation of freq PVCs at the request of Dr. Cheral Marker.   Patient Profile    77 yo male with PMH of DM and HTN who presented with left sided weakness and facial droop. Admitted and given TPA, and developed freq ectopy overnight. Mildly elevated troponin.   Past Medical History   Past Medical History:  Diagnosis Date  . Diabetes mellitus without complication ([place])   . Hypertension     Past Surgical History:  Procedure Laterality Date  . WRIST SURGERY      Allergies  No Known Allergies  History of Present Illness    Kevin [name] is a 77 yo male with PMH of DM and HTN. Reports he has not been followed by a cardiologist in the past. Seen PCP in the past, but regularly. States he has been on metformin/glimepiride at home for his DM. Former smoker. He is retired. Prior to presenting to the hospital for he has been in his usual state of health. No chest pain, shortness of breath, palpitations with daily activity.   Presented to the ED yesterday evening. Reports during the day he had been feeling dizzy and lightheaded. His LKN was 1845. He was found laying on the floor with expressive aphasia with no movement in LUE. Blood sugar was 295, and BP 160/70.   In the ED a Code Stroke was called. CT head was negative. He was given TPA via neurology. Labs showed Na+ 126, Cr 2.17>>1.90, CK total 2084, Trop 0.22, WBC 25.9>>27.7. He was admitted to neuro ICU for further management. Overnight developed freq PVCs on the monitor, but remained in SR. Cardiology fellow was called and recommended starting BB therapy. This morning according to notes he became  confused and developed tachypnea. PCCM was called for further management. Of note, he has a wound to the left plantar surface of the face. Malodorous, with redness to the dorsum of foot. Overall picture is concerning for sepsis. PVC burden has decreased with the addition of BB therapy. Echo ordered and read is pending.   Inpatient Medications    . FLUoxetine  20 mg Oral Daily  . insulin aspart  3-9 Units Subcutaneous Q4H  . labetalol  20 mg Intravenous Once  . mesalamine  0.75 g Oral BID  . metoprolol succinate  100 mg Oral Daily  . rosuvastatin  20 mg Oral q1800    Family History    Family History  Problem Relation Age of Onset  . Hypertension Father     Social History    Social History   Socioeconomic History  . Marital status: Married    Spouse name: Not on file  . Number of children: Not on file  . Years of education: Not on file  . Highest education level: Not on file  Occupational History  . Not on file  Social Needs  . Financial resource strain: Not on file  . Food insecurity:    Worry: Not on file    Inability: Not on file  . Transportation needs:    Medical: Not on file    Non-medical: Not on file  Tobacco Use  . Smoking status: Never Smoker  . Smokeless  tobacco: Never Used  Substance and Sexual Activity  . Alcohol use: Yes  . Drug use: Never  . Sexual activity: Not on file  Lifestyle  . Physical activity:    Days per week: Not on file    Minutes per session: Not on file  . Stress: Not on file  Relationships  . Social connections:    Talks on phone: Not on file    Gets together: Not on file    Attends religious service: Not on file    Active member of club or organization: Not on file    Attends meetings of clubs or organizations: Not on file    Relationship status: Not on file  . Intimate partner violence:    Fear of current or ex partner: Not on file    Emotionally abused: Not on file    Physically abused: Not on file    Forced sexual  activity: Not on file  Other Topics Concern  . Not on file  Social History Narrative  . Not on file     Review of Systems    See HPI  All other systems reviewed and are otherwise negative except as noted above.  Physical Exam    Blood pressure 139/65, pulse 94, temperature 100.3 F (37.9 C), temperature source Axillary, resp. rate (!) 27, height 6\' 1"  (1.854 m), weight 215 lb 9.8 oz (97.8 kg), SpO2 95 %.  General: Older WM, flushed Psych: Normal affect. Neuro: Alert and oriented to self, though mildly confused regarding his current situation. Moves all extremities spontaneously. HEENT: Normal  Neck: Supple, no JVD. Lungs:  Resp regular and unlabored, CTA. Heart: RRR no s3, s4, or murmurs. Abdomen: Soft, non-tender, non-distended, BS + x 4.  Extremities: Left foot ulcer with redness.   Labs    Troponin [facility] of Care Test) Recent Labs    03/16/18 2042  TROPIPOC 0.08   Recent Labs    03/16/18 2336  CKTOTAL 2,084*  CKMB 13.1*  TROPONINI 0.22*   Lab Results  Component Value Date   WBC 27.7 (H) 03/17/2018   HGB 11.9 (L) 03/17/2018   HCT 35.5 (L) 03/17/2018   MCV 87.2 03/17/2018   PLT 279 03/17/2018    Recent Labs  Lab 03/16/18 2032  03/17/18 1148  NA 126*   < > 126*  K 4.7   < > 3.9  CL 90*   < > 89*  CO2 18*  --  24  BUN 33*   < > 32*  CREATININE 2.17*   < > 1.71*  CALCIUM 8.3*  --  8.2*  PROT 6.8  --   --   BILITOT 1.1  --   --   ALKPHOS 117  --   --   ALT 46  --   --   AST 62*  --   --   GLUCOSE 304*   < > 292*   < > = values in this interval not displayed.   Lab Results  Component Value Date   CHOL 75 03/17/2018   HDL 13 (L) 03/17/2018   LDLCALC 39 03/17/2018   TRIG 114 03/17/2018   No results found for: [facility]   Radiology Studies    Ct Angio Head W Or Wo Contrast  Result Date: 03/16/2018 CLINICAL DATA:  77 year old male code stroke, left side symptoms. EXAM: CT ANGIOGRAPHY HEAD AND NECK TECHNIQUE: Multidetector CT imaging of the head  and neck was performed using the standard protocol during bolus administration of intravenous  contrast. Multiplanar CT image reconstructions and MIPs were obtained to evaluate the vascular anatomy. Carotid stenosis measurements (when applicable) are obtained utilizing NASCET criteria, using the distal internal carotid diameter as the denominator. CONTRAST:  74mL ISOVUE-370 IOPAMIDOL (ISOVUE-370) INJECTION 76% COMPARISON:  Head CT without contrast 2041 hours today. FINDINGS: CTA NECK Skeleton: Advanced cervical spine degeneration, especially left side facet arthropathy. Maxillary sinus mucoperiosteal thickening redemonstrated, greater on the left. Dental inflammatory periapical lucency occasionally noted. Chronic distal right clavicle fracture. No acute osseous abnormality identified. Upper chest: Centrilobular emphysema. No superior mediastinal lymphadenopathy. Other neck: Negative.  No neck mass or lymphadenopathy. Aortic arch: Calcified aortic atherosclerosis. 3 vessel arch configuration. Right carotid system: No brachiocephalic artery or right CCA origin stenosis despite atherosclerosis. Intermittent calcified plaque in the right CCA proximal to the bifurcation without stenosis. Mostly calcified plaque at the right carotid bifurcation and right ICA origin. Less than 50 % stenosis with respect to the distal vessel. Left carotid system: No left CCA origin stenosis despite atherosclerosis. Intermittent calcified plaque in the left CCA without stenosis. Mostly calcified plaque at the left carotid bifurcation involving the left ICA origin and bulb. Maximal stenosis is at the distal bulb level estimated at 50 % with respect to the distal vessel (series 8, image 122). Vertebral arteries: No significant proximal right subclavian artery stenosis despite bulky calcified plaque. No right vertebral artery origin stenosis. Negative right vertebral artery to the skull base. No proximal left subclavian artery stenosis despite  atherosclerosis. Calcified plaque at the left vertebral artery origin resulting in moderate to severe stenosis as seen on series 9, image 129. Intermittent left V1 calcified plaque. But no other occasional left V2 and V3 calcified plaque significant stenosis to the skull base. CTA HEAD Posterior circulation: Patent distal vertebral arteries without stenosis. The left is mildly dominant. Mild left V4 calcified plaque. Patent right PICA origin. Dominant left AICA is patent. Patent vertebrobasilar junction. Patent basilar artery is diminutive, but without stenosis. SCA and PCA origins are patent. Posterior communicating arteries are diminutive or absent. No PCA stenosis or branch occlusion identified. Anterior circulation: Both ICA siphons are patent. Mild to moderate left siphon calcified plaque with only mild left cavernous segment stenosis. Mild to moderate right siphon calcified plaque. Mild right siphon supraclinoid segment stenosis. Patent carotid termini. Normal MCA and ACA origins. Anterior communicating artery is normal. The left ACA appears somewhat dominant throughout. Left MCA M1 segment, bifurcation, and left MCA branches are within normal limits. Right MCA M1 segment is patent with mild irregularity but no high-grade stenosis. Right MCA bifurcation and right MCA branches are within normal limits. Venous sinuses: Not evaluated due to early contrast phase. Anatomic variants: Mildly dominant left vertebral artery, left ACA. Review of the MIP images confirms the above findings IMPRESSION: 1. Negative for large vessel occlusion. This was preliminarily discussed by telephone with Dr. Kerney Elbe on 03/16/2018 at 2114 hours. 2. Positive for atherosclerosis in the head and neck, and Aortic Atherosclerosis (ICD10-I70.0). 3. 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 No hemodynamically significant carotid or right vertebral stenosis. 4.  Emphysema (ICD10-J43.9).  Electronically Signed   By: Genevie Ann M.D.   On: 03/16/2018 21:25   Ct Angio Neck W Or Wo Contrast  Result Date: 03/16/2018 CLINICAL DATA:  77 year old male code stroke, left side symptoms. EXAM: CT ANGIOGRAPHY HEAD AND NECK TECHNIQUE: Multidetector CT imaging of the head and neck was performed using the standard protocol during bolus administration of  intravenous contrast. Multiplanar CT image reconstructions and MIPs were obtained to evaluate the vascular anatomy. Carotid stenosis measurements (when applicable) are obtained utilizing NASCET criteria, using the distal internal carotid diameter as the denominator. CONTRAST:  63mL ISOVUE-370 IOPAMIDOL (ISOVUE-370) INJECTION 76% COMPARISON:  Head CT without contrast 2041 hours today. FINDINGS: CTA NECK Skeleton: Advanced cervical spine degeneration, especially left side facet arthropathy. Maxillary sinus mucoperiosteal thickening redemonstrated, greater on the left. Dental inflammatory periapical lucency occasionally noted. Chronic distal right clavicle fracture. No acute osseous abnormality identified. Upper chest: Centrilobular emphysema. No superior mediastinal lymphadenopathy. Other neck: Negative.  No neck mass or lymphadenopathy. Aortic arch: Calcified aortic atherosclerosis. 3 vessel arch configuration. Right carotid system: No brachiocephalic artery or right CCA origin stenosis despite atherosclerosis. Intermittent calcified plaque in the right CCA proximal to the bifurcation without stenosis. Mostly calcified plaque at the right carotid bifurcation and right ICA origin. Less than 50 % stenosis with respect to the distal vessel. Left carotid system: No left CCA origin stenosis despite atherosclerosis. Intermittent calcified plaque in the left CCA without stenosis. Mostly calcified plaque at the left carotid bifurcation involving the left ICA origin and bulb. Maximal stenosis is at the distal bulb level estimated at 50 % with respect to the distal vessel  (series 8, image 122). Vertebral arteries: No significant proximal right subclavian artery stenosis despite bulky calcified plaque. No right vertebral artery origin stenosis. Negative right vertebral artery to the skull base. No proximal left subclavian artery stenosis despite atherosclerosis. Calcified plaque at the left vertebral artery origin resulting in moderate to severe stenosis as seen on series 9, image 129. Intermittent left V1 calcified plaque. But no other occasional left V2 and V3 calcified plaque significant stenosis to the skull base. CTA HEAD Posterior circulation: Patent distal vertebral arteries without stenosis. The left is mildly dominant. Mild left V4 calcified plaque. Patent right PICA origin. Dominant left AICA is patent. Patent vertebrobasilar junction. Patent basilar artery is diminutive, but without stenosis. SCA and PCA origins are patent. Posterior communicating arteries are diminutive or absent. No PCA stenosis or branch occlusion identified. Anterior circulation: Both ICA siphons are patent. Mild to moderate left siphon calcified plaque with only mild left cavernous segment stenosis. Mild to moderate right siphon calcified plaque. Mild right siphon supraclinoid segment stenosis. Patent carotid termini. Normal MCA and ACA origins. Anterior communicating artery is normal. The left ACA appears somewhat dominant throughout. Left MCA M1 segment, bifurcation, and left MCA branches are within normal limits. Right MCA M1 segment is patent with mild irregularity but no high-grade stenosis. Right MCA bifurcation and right MCA branches are within normal limits. Venous sinuses: Not evaluated due to early contrast phase. Anatomic variants: Mildly dominant left vertebral artery, left ACA. Review of the MIP images confirms the above findings IMPRESSION: 1. Negative for large vessel occlusion. This was preliminarily discussed by telephone with Dr. Kerney Elbe on 03/16/2018 at 2114 hours. 2. Positive  for atherosclerosis in the head and neck, and Aortic Atherosclerosis (ICD10-I70.0). 3. 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 No hemodynamically significant carotid or right vertebral stenosis. 4.  Emphysema (ICD10-J43.9). Electronically Signed   By: Genevie Ann M.D.   On: 03/16/2018 21:25   Dg Chest Port 1 View  Result Date: 03/17/2018 CLINICAL DATA:  Respiratory distress EXAM: PORTABLE CHEST 1 VIEW COMPARISON:  10/20/2014 FINDINGS: Cardiac shadows within normal limits. Aortic calcifications are again seen. The lungs are well aerated bilaterally. No focal infiltrate or sizable effusion  is noted. No acute bony abnormality is seen. IMPRESSION: No active disease. Electronically Signed   By: Inez Catalina M.D.   On: 03/17/2018 12:35   Dg Foot 2 Views Left  Result Date: 03/17/2018 CLINICAL DATA:  Cellulitis of foot EXAM: LEFT FOOT - 2 VIEW COMPARISON:  None. FINDINGS: Changes consistent with prior fifth metatarsal fracture with healing are noted. Degenerative changes are noted at the first metatarsal tarsal articulation. Diffuse tarsal degenerative changes are seen. No acute fracture or dislocation is noted. No soft tissue abnormality is seen. IMPRESSION: Chronic changes without acute abnormality. Electronically Signed   By: Inez Catalina M.D.   On: 03/17/2018 12:36   Ct Head Code Stroke Wo Contrast  Result Date: 03/16/2018 CLINICAL DATA:  Code stroke. 77 year old male with left side weakness and facial droop last seen normal 1840 hours. EXAM: CT HEAD WITHOUT CONTRAST TECHNIQUE: Contiguous axial images were obtained from the base of the skull through the vertex without intravenous contrast. COMPARISON:  None. FINDINGS: Brain: No midline shift, ventriculomegaly, mass effect, evidence of mass lesion, intracranial hemorrhage or evidence of cortically based acute infarction. No cortical encephalomalacia identified. Bilateral gray-white matter differentiation is  within normal limits for age. Vascular: Calcified atherosclerosis at the skull base. No suspicious intracranial vascular hyperdensity. Skull: Negative. Sinuses/Orbits: Maxillary mucoperiosteal thickening greater on the left. Other paranasal sinuses are well pneumatized. Tympanic cavities and mastoids are clear. Other: Visualized orbit soft tissues are within normal limits. Visualized scalp soft tissues are within normal limits. ASPECTS [facility] Stroke Program Early CT Score) - Ganglionic level infarction (caudate, lentiform nuclei, internal capsule, insula, M1-M3 cortex): 7 - Supraganglionic infarction (M4-M6 cortex): 3 Total score (0-10 with 10 being normal): 10 IMPRESSION: 1. Negative for age. No acute cortically based infarct or intracranial hemorrhage is identified. 2. ASPECTS is 10. 3. Chronic maxillary sinusitis. 4. These results were communicated to Dr. Cheral Marker at 8:50 pmon 6/20/2019by text page via the [facility] messaging system. Electronically Signed   By: Genevie Ann M.D.   On: 03/16/2018 20:51    ECG & Cardiac Imaging    EKG:  The EKG was personally reviewed and demonstrates SR with RBBB  Echo: pending  Assessment & Plan    77 yo male with PMH of DM and HTN who presented with left sided weakness and facial droop. Admitted and given TPA, and developed freq ectopy overnight. Mildly elevated troponin.   1. Freq Ectopy: Noted on telemetry last evening, and BB added with improvement. Suspect this is 2/2 to his recent CVA and current infection/sepsis in the left foot. 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 Agree with echo, read is pending. Would continue with BB therapy at this time.   2. Elevated Troponin: Trop 0.22, in the setting of CK total >2000. No chest pain prior to admission.  -- would continue to cycle -- echo for WMA  3. 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 Given TPA last evening per neurology.  MRI pending.   4. Sepsis/Left foot wound: Patient is unclear when he developed this. WBC 27,  and he is now confused this morning. Place on antibiotics per primary. PCCM, and Ortho consulted. May need to go for wound wash out.   5. DM: Has been on oral agents at home. Hgb A1c 7.6 on arrival. On SSI   Signed, Reino Bellis, NP-C Pager [phone] 03/17/2018, 12:51 PM   I have personally seen and examined this patient. I agree with the assessment and plan as outlined above.  He has  no known CAD. He is admitted with an acute CVA and is s/p TPA therapy. His neurological symptoms resolved following TPA. PVCs last night. Cardiology called and recommended a beta blocker. PVC burden is less today. He has a left foot wound on the plantar surface. The wound is malodorous with surrounding erythema. He is now tachypneic and felt to be septic.  My exam: General: Well developed, well nourished, NAD. He is confused HEENT: OP clear, mucus membranes moist  SKIN: warm, dry. No rashes. Musculoskeletal: Muscle strength 5/5 all ext  Psychiatric: Mood and affect normal  Neck: No JVD, no carotid bruits, no thyromegaly, no lymphadenopathy.  Lungs:Clear bilaterally, no wheezes, rhonci, crackles Cardiovascular: Regular rate and rhythm. No murmurs, gallops or rubs. Abdomen:Soft. Bowel sounds present. Non-tender.  Extremities: No lower extremity edema. Left foot with open wound on plantar surface surrounded by erythema. Left PT pulse is 1+  Plan:  PVCs: Continue beta blocker. Echo is pending.   Elevated troponin: in setting of acute CVA and sepsis. Echo is pending to assess wall motion. He will likely require surgical debridement of his left foot wound. If this is felt to be an urgent procedure, I would proceed as planned and would not recommend prior cardiac ischemic testing.   We will follow with you.   Lauree Chandler 03/17/2018 1:05 PM

## 2018-03-17 NOTE — Transfer of Care (Signed)
Immediate Anesthesia Transfer of Care Note  Patient: Kevin [name]  Procedure(s) Performed: IRRIGATION AND DEBRIDEMENT FOOT (Left )  Patient Location: PACU  Anesthesia Type:General  Level of Consciousness: sedated, drowsy, patient cooperative and responds to stimulation  Airway & Oxygen Therapy: Patient Spontanous Breathing and Patient connected to nasal cannula oxygen  Post-op Assessment: Report given to RN, Post -op Vital signs reviewed and stable and Patient moving all extremities X 4  Post vital signs: Reviewed and stable  Last Vitals:  Vitals Value Taken Time  BP 118/62 03/17/2018  8:50 PM  Temp    Pulse 90 03/17/2018  8:54 PM  Resp 26 03/17/2018  8:54 PM  SpO2 97 % 03/17/2018  8:54 PM  Vitals shown include unvalidated device data.  Last Pain:  Vitals:   03/17/18 1915  TempSrc: Oral  PainSc:          Complications: No apparent anesthesia complications

## 2018-03-17 NOTE — Progress Notes (Signed)
Echocardiogram 2D Echocardiogram has been performed.  Matilde Bash 03/17/2018, 11:09 AM

## 2018-03-17 NOTE — Progress Notes (Signed)
Inpatient Diabetes Program Recommendations  AACE/ADA: New Consensus Statement on Inpatient Glycemic Control (2015)  Target Ranges:  Prepandial:   less than 140 mg/dL      Peak postprandial:   less than 180 mg/dL (1-2 hours)      Critically ill patients:  140 - 180 mg/dL   Lab Results  Component Value Date   GLUCAP 276 (H) 03/17/2018   HGBA1C 7.6 (H) 03/17/2018    Review of Glycemic Control Results for HART, HAAS (MRN [id number]) as of 03/17/2018 14:04  Ref. Range 03/16/2018 20:31 03/17/2018 07:27 03/17/2018 11:45  Glucose-Capillary Latest Ref Range: 65 - 99 mg/dL 311 (H) 205 (H) 276 (H)   Diabetes history: Type 2 DM Outpatient Diabetes medications: Invokana 100 mg QAM, Amaryl 2 mg QAM, Januvia 100 mg QD, Metformin 1000 mg BID Current orders for Inpatient glycemic control: Novolog 0-15 units TID, Novolog 4 units TID  Inpatient Diabetes Program Recommendations:    Would recommend adding Lantus 14 units QD and add Novolog 0-5 units QHS.   Thanks, Bronson Curb, MSN, RNC-OB Diabetes Coordinator [phone] (8a-5p)

## 2018-03-17 NOTE — Consult Note (Signed)
[facility] Nurse wound consult note Reason for Consult: Left foot plantar surface wound.  Assessment performed in [facility] room 4N30.  A foam dressing was in place at the time of my assessment. Wound type: Diabetic Foot Ulcer POA: Yes Measurement: The dorsal surface of the foot is erythematous, with some swelling.  The plantar surface of the foot is macerated and discolored a light yellow.  The open area roughly measures 2 cm x 1.5 cm x 2 cm.  When I inserted the cotton tipped applicator, it went all the way in until it hit something hard, most likely bone.  A heavy foul odor emanated from the wound, and purulent looking fluid oozed out.  The patient states he has no feeling in his feet. Dressing procedure/placement/frequency: Betadine, gauze and kerlex each shift.  I explained to the patient, his spouse, Dr. Leonie Man, and the primary RN for the patient that nothing a nurse puts on this wound will solve the problem.  It needs further evaluation by surgery services.  I question if the patient has osteomyelitis and adequate blood flow to the foot to support healing.  Dr. Leonie Man will pursue further medical intervention for the foot. Monitor the wound area(s) for worsening of condition such as: Signs/symptoms of infection,  Increase in size,  Development of or worsening of odor, Development of pain, or increased pain at the affected locations.  Notify the medical team if any of these develop.  Thank you for the consult.  Discussed plan of care with the patient and bedside nurse.  [place] nurse will not follow at this time.  Please re-consult the [place] team if needed.  Val Riles, RN, MSN, CWOCN, CNS-BC, pager [phone]

## 2018-03-17 NOTE — Consult Note (Signed)
ORTHOPAEDIC CONSULTATION  REQUESTING PHYSICIAN: Kerney Elbe, MD  PCP:  [name], MD  Chief Complaint: Left foot DM ulcer  HPI: Kevin [name] is a 77 y.o. male with a left plantar foot draining wound.  This is a diabetic foot ulcer that has been present for an unclear amount of time per his wife.  The patient is currently confused and not the best historian.  Chart has been reviewed in conversation with the primary team has helped provide this HPI.  He was admitted overnight for stroke.  He has been in the ICU following the administration of anticoagulation.  Stroke symptoms have improved however he is been dealing with increasing signs of sepsis.  He has been started on vancomycin and Zosyn.  He has a draining wound on the plantar aspect of the left foot that needs operative debridement.  Past Medical History:  Diagnosis Date  . Diabetes mellitus without complication ([place])   . Hypertension    Past Surgical History:  Procedure Laterality Date  . WRIST SURGERY     Social History   Socioeconomic History  . Marital status: Married    Spouse name: Not on file  . Number of children: Not on file  . Years of education: Not on file  . Highest education level: Not on file  Occupational History  . Not on file  Social Needs  . Financial resource strain: Not on file  . Food insecurity:    Worry: Not on file    Inability: Not on file  . Transportation needs:    Medical: Not on file    Non-medical: Not on file  Tobacco Use  . Smoking status: Never Smoker  . Smokeless tobacco: Never Used  Substance and Sexual Activity  . Alcohol use: Yes  . Drug use: Never  . Sexual activity: Not on file  Lifestyle  . Physical activity:    Days per week: Not on file    Minutes per session: Not on file  . Stress: Not on file  Relationships  . Social connections:    Talks on phone: Not on file    Gets together: Not on file    Attends religious service: Not on file    Active  member of club or organization: Not on file    Attends meetings of clubs or organizations: Not on file    Relationship status: Not on file  Other Topics Concern  . Not on file  Social History Narrative  . Not on file   Family History  Problem Relation Age of Onset  . Hypertension Father    Allergies  Allergen Reactions  . Sulfa Antibiotics Rash   Prior to Admission medications   Medication Sig Start Date End Date Taking? Authorizing Provider  aspirin EC 81 MG tablet Take 81 mg by mouth daily.   Yes [provider]  canagliflozin (INVOKANA) 100 MG TABS tablet Take 100 mg by mouth daily before breakfast.   Yes [provider]  FLUoxetine (PROZAC) 20 MG capsule Take 20 mg by mouth daily.   Yes [provider]  glimepiride (AMARYL) 4 MG tablet Take 2 mg by mouth daily with breakfast.   Yes [provider]  mesalamine (APRISO) 0.375 g 24 hr capsule Take 750 mg by mouth 2 (two) times daily.    Yes [provider]  metFORMIN (GLUCOPHAGE) 500 MG tablet Take 1,000 mg by mouth 2 (two) times daily with a meal.    Yes [provider]  metoprolol succinate (TOPROL-XL) 100 MG 24 hr tablet Take 100 mg by mouth daily. Take with or immediately following a meal.   Yes [provider]  Multiple Vitamin (MULTIVITAMIN WITH MINERALS) TABS tablet Take 1 tablet by mouth daily.   Yes [provider]  rosuvastatin (CRESTOR) 10 MG tablet Take 10 mg by mouth daily.   Yes [provider]  sitaGLIPtin (JANUVIA) 100 MG tablet Take 100 mg by mouth daily.   Yes [provider]  tamsulosin (FLOMAX) 0.4 MG CAPS capsule Take 0.4 mg by mouth daily after breakfast.  02/27/18  Yes [provider]   Ct Angio Head W Or Wo Contrast  Result Date: 03/16/2018 CLINICAL DATA:  77 year old male code stroke, left side symptoms. EXAM: CT ANGIOGRAPHY HEAD AND NECK TECHNIQUE: Multidetector CT imaging of the head and neck was performed using  the standard protocol during bolus administration of intravenous contrast. Multiplanar CT image reconstructions and MIPs were obtained to evaluate the vascular anatomy. Carotid stenosis measurements (when applicable) are obtained utilizing NASCET criteria, using the distal internal carotid diameter as the denominator. CONTRAST:  34mL ISOVUE-370 IOPAMIDOL (ISOVUE-370) INJECTION 76% COMPARISON:  Head CT without contrast 2041 hours today. FINDINGS: CTA NECK Skeleton: Advanced cervical spine degeneration, especially left side facet arthropathy. Maxillary sinus mucoperiosteal thickening redemonstrated, greater on the left. Dental inflammatory periapical lucency occasionally noted. Chronic distal right clavicle fracture. No acute osseous abnormality identified. Upper chest: Centrilobular emphysema. No superior mediastinal lymphadenopathy. Other neck: Negative.  No neck mass or lymphadenopathy. Aortic arch: Calcified aortic atherosclerosis. 3 vessel arch configuration. Right carotid system: No brachiocephalic artery or right CCA origin stenosis despite atherosclerosis. Intermittent calcified plaque in the right CCA proximal to the bifurcation without stenosis. Mostly calcified plaque at the right carotid bifurcation and right ICA origin. Less than 50 % stenosis with respect to the distal vessel. Left carotid system: No left CCA origin stenosis despite atherosclerosis. Intermittent calcified plaque in the left CCA without stenosis. Mostly calcified plaque at the left carotid bifurcation involving the left ICA origin and bulb. Maximal stenosis is at the distal bulb level estimated at 50 % with respect to the distal vessel (series 8, image 122). Vertebral arteries: No significant proximal right subclavian artery stenosis despite bulky calcified plaque. No right vertebral artery origin stenosis. Negative right vertebral artery to the skull base. No proximal left subclavian artery stenosis despite atherosclerosis. Calcified  plaque at the left vertebral artery origin resulting in moderate to severe stenosis as seen on series 9, image 129. Intermittent left V1 calcified plaque. But no other occasional left V2 and V3 calcified plaque significant stenosis to the skull base. CTA HEAD Posterior circulation: Patent distal vertebral arteries without stenosis. The left is mildly dominant. Mild left V4 calcified plaque. Patent right PICA origin. Dominant left AICA is patent. Patent vertebrobasilar junction. Patent basilar artery is diminutive, but without stenosis. SCA and PCA origins are patent. Posterior communicating arteries are diminutive or absent. No PCA stenosis or branch occlusion identified. Anterior circulation: Both ICA siphons are patent. Mild to moderate left siphon calcified plaque with only mild left cavernous segment stenosis. Mild to moderate right siphon calcified plaque. Mild right siphon supraclinoid segment stenosis. Patent carotid termini. Normal MCA and ACA origins. Anterior communicating artery is normal. The left ACA appears somewhat dominant throughout. Left MCA M1 segment, bifurcation, and left MCA branches are within normal limits. Right MCA M1 segment is patent with mild irregularity but no high-grade stenosis. Right MCA bifurcation and right MCA branches are  within normal limits. Venous sinuses: Not evaluated due to early contrast phase. Anatomic variants: Mildly dominant left vertebral artery, left ACA. Review of the MIP images confirms the above findings IMPRESSION: 1. Negative for large vessel occlusion. This was preliminarily discussed by telephone with Dr. Kerney Elbe on 03/16/2018 at 2114 hours. 2. Positive for atherosclerosis in the head and neck, and Aortic Atherosclerosis (ICD10-I70.0). 3. 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 No hemodynamically significant carotid or right vertebral stenosis. 4.  Emphysema (ICD10-J43.9). Electronically Signed   By: Genevie Ann M.D.   On: 03/16/2018 21:25   Ct Head Wo Contrast  Result Date: 03/17/2018 CLINICAL DATA:  Septic, increasing confusion. 24 hours after tPA. History of hypertension, diabetes. EXAM: CT HEAD WITHOUT CONTRAST TECHNIQUE: Contiguous axial images were obtained from the base of the skull through the vertex without intravenous contrast. COMPARISON:  CT HEAD March 16, 2018 FINDINGS: BRAIN: No intraparenchymal hemorrhage, mass effect nor midline shift. The ventricles and sulci are normal for age. Patchy supratentorial white matter hypodensities within normal range for patient's age, though non-specific are most compatible with chronic small vessel ischemic disease. No acute large vascular territory infarcts. No abnormal extra-axial fluid collections. Basal cisterns are patent. VASCULAR: Mild-to-moderate calcific atherosclerosis of the carotid siphons. SKULL: No skull fracture. No significant scalp soft tissue swelling. SINUSES/ORBITS: Chronic bony remodeling LEFT maxillary sinus compatible with chronic sinusitis, no acute component. Mastoid air cells are well aerated.The included ocular globes and orbital contents are non-suspicious. OTHER: None. IMPRESSION: Negative non-contrast CT HEAD for age. Electronically Signed   By: Elon Alas M.D.   On: 03/17/2018 17:51   Ct Angio Neck W Or Wo Contrast  Result Date: 03/16/2018 CLINICAL DATA:  77 year old male code stroke, left side symptoms. EXAM: CT ANGIOGRAPHY HEAD AND NECK TECHNIQUE: Multidetector CT imaging of the head and neck was performed using the standard protocol during bolus administration of intravenous contrast. Multiplanar CT image reconstructions and MIPs were obtained to evaluate the vascular anatomy. Carotid stenosis measurements (when applicable) are obtained utilizing NASCET criteria, using the distal internal carotid diameter as the denominator. CONTRAST:  21mL ISOVUE-370 IOPAMIDOL (ISOVUE-370) INJECTION 76% COMPARISON:  Head CT without  contrast 2041 hours today. FINDINGS: CTA NECK Skeleton: Advanced cervical spine degeneration, especially left side facet arthropathy. Maxillary sinus mucoperiosteal thickening redemonstrated, greater on the left. Dental inflammatory periapical lucency occasionally noted. Chronic distal right clavicle fracture. No acute osseous abnormality identified. Upper chest: Centrilobular emphysema. No superior mediastinal lymphadenopathy. Other neck: Negative.  No neck mass or lymphadenopathy. Aortic arch: Calcified aortic atherosclerosis. 3 vessel arch configuration. Right carotid system: No brachiocephalic artery or right CCA origin stenosis despite atherosclerosis. Intermittent calcified plaque in the right CCA proximal to the bifurcation without stenosis. Mostly calcified plaque at the right carotid bifurcation and right ICA origin. Less than 50 % stenosis with respect to the distal vessel. Left carotid system: No left CCA origin stenosis despite atherosclerosis. Intermittent calcified plaque in the left CCA without stenosis. Mostly calcified plaque at the left carotid bifurcation involving the left ICA origin and bulb. Maximal stenosis is at the distal bulb level estimated at 50 % with respect to the distal vessel (series 8, image 122). Vertebral arteries: No significant proximal right subclavian artery stenosis despite bulky calcified plaque. No right vertebral artery origin stenosis. Negative right vertebral artery to the skull base. No proximal left subclavian artery stenosis despite atherosclerosis. Calcified plaque at the left vertebral artery origin resulting in moderate  to severe stenosis as seen on series 9, image 129. Intermittent left V1 calcified plaque. But no other occasional left V2 and V3 calcified plaque significant stenosis to the skull base. CTA HEAD Posterior circulation: Patent distal vertebral arteries without stenosis. The left is mildly dominant. Mild left V4 calcified plaque. Patent right PICA  origin. Dominant left AICA is patent. Patent vertebrobasilar junction. Patent basilar artery is diminutive, but without stenosis. SCA and PCA origins are patent. Posterior communicating arteries are diminutive or absent. No PCA stenosis or branch occlusion identified. Anterior circulation: Both ICA siphons are patent. Mild to moderate left siphon calcified plaque with only mild left cavernous segment stenosis. Mild to moderate right siphon calcified plaque. Mild right siphon supraclinoid segment stenosis. Patent carotid termini. Normal MCA and ACA origins. Anterior communicating artery is normal. The left ACA appears somewhat dominant throughout. Left MCA M1 segment, bifurcation, and left MCA branches are within normal limits. Right MCA M1 segment is patent with mild irregularity but no high-grade stenosis. Right MCA bifurcation and right MCA branches are within normal limits. Venous sinuses: Not evaluated due to early contrast phase. Anatomic variants: Mildly dominant left vertebral artery, left ACA. Review of the MIP images confirms the above findings IMPRESSION: 1. Negative for large vessel occlusion. This was preliminarily discussed by telephone with Dr. Kerney Elbe on 03/16/2018 at 2114 hours. 2. Positive for atherosclerosis in the head and neck, and Aortic Atherosclerosis (ICD10-I70.0). 3. 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 No hemodynamically significant carotid or right vertebral stenosis. 4.  Emphysema (ICD10-J43.9). Electronically Signed   By: Genevie Ann M.D.   On: 03/16/2018 21:25   Dg Chest Port 1 View  Result Date: 03/17/2018 CLINICAL DATA:  Respiratory distress EXAM: PORTABLE CHEST 1 VIEW COMPARISON:  10/20/2014 FINDINGS: Cardiac shadows within normal limits. Aortic calcifications are again seen. The lungs are well aerated bilaterally. No focal infiltrate or sizable effusion is noted. No acute bony abnormality is seen. IMPRESSION: No active  disease. Electronically Signed   By: Inez Catalina M.D.   On: 03/17/2018 12:35   Dg Foot 2 Views Left  Result Date: 03/17/2018 CLINICAL DATA:  Cellulitis of foot EXAM: LEFT FOOT - 2 VIEW COMPARISON:  None. FINDINGS: Changes consistent with prior fifth metatarsal fracture with healing are noted. Degenerative changes are noted at the first metatarsal tarsal articulation. Diffuse tarsal degenerative changes are seen. No acute fracture or dislocation is noted. No soft tissue abnormality is seen. IMPRESSION: Chronic changes without acute abnormality. Electronically Signed   By: Inez Catalina M.D.   On: 03/17/2018 12:36   Ct Head Code Stroke Wo Contrast  Result Date: 03/16/2018 CLINICAL DATA:  Code stroke. 77 year old male with left side weakness and facial droop last seen normal 1840 hours. EXAM: CT HEAD WITHOUT CONTRAST TECHNIQUE: Contiguous axial images were obtained from the base of the skull through the vertex without intravenous contrast. COMPARISON:  None. FINDINGS: Brain: No midline shift, ventriculomegaly, mass effect, evidence of mass lesion, intracranial hemorrhage or evidence of cortically based acute infarction. No cortical encephalomalacia identified. Bilateral gray-white matter differentiation is within normal limits for age. Vascular: Calcified atherosclerosis at the skull base. No suspicious intracranial vascular hyperdensity. Skull: Negative. Sinuses/Orbits: Maxillary mucoperiosteal thickening greater on the left. Other paranasal sinuses are well pneumatized. Tympanic cavities and mastoids are clear. Other: Visualized orbit soft tissues are within normal limits. Visualized scalp soft tissues are within normal limits. ASPECTS [facility] Stroke Program Early CT Score) - Ganglionic level  infarction (caudate, lentiform nuclei, internal capsule, insula, M1-M3 cortex): 7 - Supraganglionic infarction (M4-M6 cortex): 3 Total score (0-10 with 10 being normal): 10 IMPRESSION: 1. Negative for age. No acute  cortically based infarct or intracranial hemorrhage is identified. 2. ASPECTS is 10. 3. Chronic maxillary sinusitis. 4. These results were communicated to Dr. Cheral Marker at 8:50 pmon 6/20/2019by text page via the [facility] messaging system. Electronically Signed   By: Genevie Ann M.D.   On: 03/16/2018 20:51    Positive ROS: All other systems have been reviewed and were otherwise negative with the exception of those mentioned in the HPI and as above.  Physical Exam: General: no acute distress Cardiovascular: Palpable distal pulses.  In the left foot 1+ at the dorsalis pedis. Respiratory: No cyanosis, no use of accessory musculature GI: No organomegaly, abdomen is soft and non-tender Skin: No lesions in the area of chief complaint Neurologic: Decreased sensation in a stocking glove distribution Psychiatric: Patient is not competent for consent  Lymphatic: No axillary or cervical lymphadenopathy  MUSCULOSKELETAL:  Left foot: Wiggles toes spontaneously.  Skin is warm and well-perfused.  Has erythema about the second third toe on the dorsum of the foot to about the midfoot.  Plantarly he has a quarter sized circular ulceration that seems to be through to the deep fascia.  There is purulence draining from this and a foul odor.  Assessment: Left plantar foot diabetic foot ulcer  Plan: -Plan for emergent debridement of this wound due to his worsening clinical state.  He is not currently consentable.  We will move forward with surgery due to its urgent nature.  I have attempted to contact his wife Mrs. Villagomez at the number in the chart however that seems to be a landline and not answered at this time. -We will plan for excisional debridement of skin, subcutaneous tissue, muscle, fascia and bone as needed.  We will send cultures as well.  He will return to the 4 N. ICU postoperatively.    [name], MD Cell [phone]    03/17/2018 6:36 PM

## 2018-03-17 NOTE — Op Note (Signed)
Date of Surgery: 03/17/2018  INDICATIONS: Mr. Colaizzi is a 77 y.o.-year-old male with a left diabetic foot ulcer with abscess.  He was admitted to the neuro ICU following a stroke last night.  On secondary survey he was noted to have a plantar foot ulcer.  During the course of the day today he was decompensating and becoming septic.  The wound was foul-smelling and concern was that the source of his sepsis was the infected foot.  At the time of bringing the patient down to the operating theater he was becoming delirious and unable to provide consent for himself.  Unfortunately, his wife had left the hospital for an errand and was unavailable by phone.  Due to his decompensation we thought it be in his best interest to move forward with emergent consent due to the urgent nature of his situation.  PREOPERATIVE DIAGNOSIS: Left foot diabetic ulcer with abscess  POSTOPERATIVE DIAGNOSIS: Same.  PROCEDURE:  1. Excisional debridement of skin, subcutaneous tissue, Muscle, fascia, and bone of the left plantar foot wound. 2.  Bone biopsy of second metatarsal  SURGEON: Jason P. Stann Mainland, M.D.  ASSIST: None.  ANESTHESIA:  general  IV FLUIDS AND URINE: See anesthesia.  ESTIMATED BLOOD LOSS: 10 mL.  IMPLANTS: None  DRAINS: None  COMPLICATIONS: None.  Tourniquet: 17 minutes  DESCRIPTION OF PROCEDURE: The patient was brought to the operating room and placed supine on the operating table.  The patient had been signed prior to the procedure and this was documented. The patient had the anesthesia placed by the anesthesiologist.  A time-out was performed to confirm that this was the correct patient, site, side and location. The patient did receive antibiotics prior to the incision and was re-dosed during the procedure as needed at indicated intervals.  A tourniquet was placed.  The patient had the operative extremity prepped and draped in the standard surgical fashion.      Utilizing a sterile towel and an  Esmarch a tourniquet was applied to the lower calf.  This was clamped with a Kelly clamp.  Total tourniquet time was 17 minutes.  This was released at the end of dressings being applied.  The plantar wound was noted to be 2 cm x 2-1/2 cm and overlying the second metatarsal head.  Utilizing a 10 blade the excisional debridement was began removing all nonviable skin, subcutaneous tissue, and muscle and fascia.  The incision was then extended in a proximal distal manner to expose the plantar aspect of the foot along the second metatarsal.  Gross purulence was encountered directly deep to this incision.  Next a Valora Corporal was used to move between the second and third metatarsal into the dorsum of the foot.  This entered into a pocket of purulence on the dorsal aspect of the foot between the second and third metatarsals.  That was adequately decompressed as well.  We sent cultures of the purulence for both aerobic and anaerobic pathogens.  We also sent soft tissue biopsy.  Next we copiously irrigated the wound with 3 L of normal saline.  Next we performed a bone biopsy utilizing rondure of the second metatarsal neck and head.  This was passed off the field and sent to pathology.  Next a deep tissue biopsy was also sent to pathology in the same manner.  The wound and leg were cleaned and dried one final time.  The plantar wound was left open and packed with a normal saline soaked gauze.  Dry gauze were placed on top  of this in a wet-to-dry fashion.  The leg was wrapped in a sterile dressing.  The tourniquet was released.  There were no immediate intraoperative complications.  All counts were correct x2.  The patient was awakened from general anesthesia and transferred to PACU in stable condition.  POSTOPERATIVE PLAN:  The patient will return to the 4 N. ICU for neuro ICU care.  He will continue IV broad-spectrum antibiotics per the guidance of the infectious disease team.  Likely this will be able to be de-escalated  once we have definitive cultures.  He should begin wet-to-dry dressing changes twice daily beginning tomorrow morning.  I would recommend continuation of the wound care consult.  He may need a second look which I would recommend being performed by [name] Given, who is aware of the patient and will be available to see the patient next week on Tuesday or Wednesday.  He is okay for heel weightbearing as tolerated to the left foot.

## 2018-03-17 NOTE — Progress Notes (Signed)
PT Cancellation Note  Patient Details Name: Kevin [name] MRN: [id number] DOB: September 11, 1941   Cancelled Treatment:    Reason Eval/Treat Not Completed: Medical issues which prohibited therapy(pt with elevated troponin await cardiology clearance)   Maija B Prater 03/17/2018, 7:15 AM Elwyn Reach, [place]

## 2018-03-17 NOTE — Consult Note (Addendum)
PULMONARY / CRITICAL CARE MEDICINE   Name: Kevin [name] MRN: [id number] DOB: [birth year]    ADMISSION DATE:  03/16/2018 CONSULTATION DATE: 03/18/2018  REFERRING MD: Dr. Leonie Man, MD  CHIEF COMPLAINT: Dyspnea, tachypnea  HISTORY OF PRESENT ILLNESS:   77 year old with history of hypertension, diabetes type 2 presents with acute aphasia, left arm, leg weakness.  Treated as code stroke, given TPA.  Admitted to neurology service  Overnight he has become more tachypneic, tachycardic with frequent PVCs.  PCCM consulted for help with management Noted to have purulent left foot ulcer.  Patient states that it started as a corn and proceeded to ulcerate with foul-smelling discharge over several weeks.  He has not sought medical care or received antibiotics for this.  History also significant for ENT evaluation this month for left ear sensorineural hearing loss which was treated with steroid injections into the middle ear.  PAST MEDICAL HISTORY :  He  has a past medical history of Diabetes mellitus without complication ([place]) and Hypertension.  PAST SURGICAL HISTORY: He  has a past surgical history that includes Wrist surgery.  No Known Allergies  No current facility-administered medications on file prior to encounter.    Current Outpatient Medications on File Prior to Encounter  Medication Sig  . aspirin EC 81 MG tablet Take 81 mg by mouth daily.  . canagliflozin (INVOKANA) 100 MG TABS tablet Take 100 mg by mouth daily before breakfast.  . FLUoxetine (PROZAC) 20 MG capsule Take 20 mg by mouth daily.  Marland Kitchen glimepiride (AMARYL) 4 MG tablet Take 2 mg by mouth daily with breakfast.  . mesalamine (APRISO) 0.375 g 24 hr capsule Take 750 mg by mouth 2 (two) times daily.   . metFORMIN (GLUCOPHAGE) 500 MG tablet Take 1,000 mg by mouth 2 (two) times daily with a meal.   . metoprolol succinate (TOPROL-XL) 100 MG 24 hr tablet Take 100 mg by mouth daily. Take with or immediately following a meal.  .  Multiple Vitamin (MULTIVITAMIN WITH MINERALS) TABS tablet Take 1 tablet by mouth daily.  . rosuvastatin (CRESTOR) 10 MG tablet Take 10 mg by mouth daily.  . sitaGLIPtin (JANUVIA) 100 MG tablet Take 100 mg by mouth daily.    FAMILY HISTORY:  His indicated that his mother is deceased. He indicated that his father is deceased.   SOCIAL HISTORY: He  reports that he has never smoked. He has never used smokeless tobacco. He reports that he drinks alcohol. He reports that he does not use drugs.  REVIEW OF SYSTEMS:   All negative; except for those that are bolded, which indicate positives.  Constitutional: weight loss, weight gain, night sweats, fevers, chills, fatigue, weakness.  HEENT: headaches, sore throat, sneezing, nasal congestion, post nasal drip, difficulty swallowing, tooth/dental problems, visual complaints, visual changes, ear aches. Neuro: difficulty with speech, weakness, numbness, ataxia. CV:  chest pain, orthopnea, PND, swelling in lower extremities, dizziness, palpitations, syncope.  Resp: cough, hemoptysis, dyspnea, wheezing. GI: heartburn, indigestion, abdominal pain, nausea, vomiting, diarrhea, constipation, change in bowel habits, loss of appetite, hematemesis, melena, hematochezia.  GU: dysuria, change in color of urine, urgency or frequency, flank pain, hematuria. MSK: joint pain or swelling, decreased range of motion. Psych: change in mood or affect, depression, anxiety, suicidal ideations, homicidal ideations. Skin: rash, itching, bruising.  SUBJECTIVE:    VITAL SIGNS: BP (!) 141/72   Pulse 97   Temp 97.9 F (36.6 C) (Oral)   Resp (!) 25   Ht 6\' 1"  (1.854 m)   Wt  215 lb 9.8 oz (97.8 kg)   SpO2 96%   BMI 28.45 kg/m   HEMODYNAMICS:    VENTILATOR SETTINGS:    INTAKE / OUTPUT: I/O last 3 completed shifts: In: 544.1 [I.V.:494.2; IV Piggyback:50] Out: 3350 [Urine:3350]  PHYSICAL EXAMINATION: Blood pressure (!) 141/72, pulse 97, temperature 97.9 F (36.6  C), temperature source Oral, resp. rate (!) 25, height 6\' 1"  (1.854 m), weight 215 lb 9.8 oz (97.8 kg), SpO2 96 %. Gen:      No acute distress HEENT:  EOMI, sclera anicteric Neck:     No masses; no thyromegaly Lungs:    Clear to auscultation bilaterally; normal respiratory effort CV:         Regular rate and rhythm; no murmurs Abd:      + bowel sounds; soft, non-tender; no palpable masses, no distension Ext:    Left plantar foot ulcer with purulent secretion, erythema extending up to the ankle. Skin:      Warm and dry; no rash Neuro: alert and oriented x 3 Psych: normal mood and affect     LABS:  BMET Recent Labs  Lab 03/16/18 2032 03/16/18 2039  NA 126* 124*  K 4.7 4.4  CL 90* 90*  CO2 18*  --   BUN 33* 32*  CREATININE 2.17* 1.90*  GLUCOSE 304* 312*    Electrolytes Recent Labs  Lab 03/16/18 2032  CALCIUM 8.3*    CBC Recent Labs  Lab 03/16/18 2032 03/16/18 2039 03/17/18 0846  WBC 25.9*  --  27.7*  HGB 13.1 13.6 11.9*  HCT 39.4 40.0 35.5*  PLT 274  --  279    Coag's Recent Labs  Lab 03/16/18 2032  APTT 32  INR 1.23    Sepsis Markers No results for input(s): LATICACIDVEN, PROCALCITON, O2SATVEN in the last 168 hours.  ABG No results for input(s): PHART, PCO2ART, PO2ART in the last 168 hours.  Liver Enzymes Recent Labs  Lab 03/16/18 2032  AST 62*  ALT 46  ALKPHOS 117  BILITOT 1.1  ALBUMIN 2.7*    Cardiac Enzymes Recent Labs  Lab 03/16/18 2336  TROPONINI 0.22*    Glucose Recent Labs  Lab 03/17/18 0727  GLUCAP 205*    Imaging Ct Angio Head W Or Wo Contrast  Result Date: 03/16/2018 CLINICAL DATA:  77 year old male code stroke, left side symptoms. EXAM: CT ANGIOGRAPHY HEAD AND NECK TECHNIQUE: Multidetector CT imaging of the head and neck was performed using the standard protocol during bolus administration of intravenous contrast. Multiplanar CT image reconstructions and MIPs were obtained to evaluate the vascular anatomy. Carotid  stenosis measurements (when applicable) are obtained utilizing NASCET criteria, using the distal internal carotid diameter as the denominator. CONTRAST:  27mL ISOVUE-370 IOPAMIDOL (ISOVUE-370) INJECTION 76% COMPARISON:  Head CT without contrast 2041 hours today. FINDINGS: CTA NECK Skeleton: Advanced cervical spine degeneration, especially left side facet arthropathy. Maxillary sinus mucoperiosteal thickening redemonstrated, greater on the left. Dental inflammatory periapical lucency occasionally noted. Chronic distal right clavicle fracture. No acute osseous abnormality identified. Upper chest: Centrilobular emphysema. No superior mediastinal lymphadenopathy. Other neck: Negative.  No neck mass or lymphadenopathy. Aortic arch: Calcified aortic atherosclerosis. 3 vessel arch configuration. Right carotid system: No brachiocephalic artery or right CCA origin stenosis despite atherosclerosis. Intermittent calcified plaque in the right CCA proximal to the bifurcation without stenosis. Mostly calcified plaque at the right carotid bifurcation and right ICA origin. Less than 50 % stenosis with respect to the distal vessel. Left carotid system: No left CCA origin stenosis  despite atherosclerosis. Intermittent calcified plaque in the left CCA without stenosis. Mostly calcified plaque at the left carotid bifurcation involving the left ICA origin and bulb. Maximal stenosis is at the distal bulb level estimated at 50 % with respect to the distal vessel (series 8, image 122). Vertebral arteries: No significant proximal right subclavian artery stenosis despite bulky calcified plaque. No right vertebral artery origin stenosis. Negative right vertebral artery to the skull base. No proximal left subclavian artery stenosis despite atherosclerosis. Calcified plaque at the left vertebral artery origin resulting in moderate to severe stenosis as seen on series 9, image 129. Intermittent left V1 calcified plaque. But no other occasional  left V2 and V3 calcified plaque significant stenosis to the skull base. CTA HEAD Posterior circulation: Patent distal vertebral arteries without stenosis. The left is mildly dominant. Mild left V4 calcified plaque. Patent right PICA origin. Dominant left AICA is patent. Patent vertebrobasilar junction. Patent basilar artery is diminutive, but without stenosis. SCA and PCA origins are patent. Posterior communicating arteries are diminutive or absent. No PCA stenosis or branch occlusion identified. Anterior circulation: Both ICA siphons are patent. Mild to moderate left siphon calcified plaque with only mild left cavernous segment stenosis. Mild to moderate right siphon calcified plaque. Mild right siphon supraclinoid segment stenosis. Patent carotid termini. Normal MCA and ACA origins. Anterior communicating artery is normal. The left ACA appears somewhat dominant throughout. Left MCA M1 segment, bifurcation, and left MCA branches are within normal limits. Right MCA M1 segment is patent with mild irregularity but no high-grade stenosis. Right MCA bifurcation and right MCA branches are within normal limits. Venous sinuses: Not evaluated due to early contrast phase. Anatomic variants: Mildly dominant left vertebral artery, left ACA. Review of the MIP images confirms the above findings IMPRESSION: 1. Negative for large vessel occlusion. This was preliminarily discussed by telephone with Dr. Kerney Elbe on 03/16/2018 at 2114 hours. 2. Positive for atherosclerosis in the head and neck, and Aortic Atherosclerosis (ICD10-I70.0). 3. 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 No hemodynamically significant carotid or right vertebral stenosis. 4.  Emphysema (ICD10-J43.9). Electronically Signed   By: Genevie Ann M.D.   On: 03/16/2018 21:25   Ct Angio Neck W Or Wo Contrast  Result Date: 03/16/2018 CLINICAL DATA:  77 year old male code stroke, left side symptoms. EXAM: CT  ANGIOGRAPHY HEAD AND NECK TECHNIQUE: Multidetector CT imaging of the head and neck was performed using the standard protocol during bolus administration of intravenous contrast. Multiplanar CT image reconstructions and MIPs were obtained to evaluate the vascular anatomy. Carotid stenosis measurements (when applicable) are obtained utilizing NASCET criteria, using the distal internal carotid diameter as the denominator. CONTRAST:  18mL ISOVUE-370 IOPAMIDOL (ISOVUE-370) INJECTION 76% COMPARISON:  Head CT without contrast 2041 hours today. FINDINGS: CTA NECK Skeleton: Advanced cervical spine degeneration, especially left side facet arthropathy. Maxillary sinus mucoperiosteal thickening redemonstrated, greater on the left. Dental inflammatory periapical lucency occasionally noted. Chronic distal right clavicle fracture. No acute osseous abnormality identified. Upper chest: Centrilobular emphysema. No superior mediastinal lymphadenopathy. Other neck: Negative.  No neck mass or lymphadenopathy. Aortic arch: Calcified aortic atherosclerosis. 3 vessel arch configuration. Right carotid system: No brachiocephalic artery or right CCA origin stenosis despite atherosclerosis. Intermittent calcified plaque in the right CCA proximal to the bifurcation without stenosis. Mostly calcified plaque at the right carotid bifurcation and right ICA origin. Less than 50 % stenosis with respect to the distal vessel. Left carotid system: No left CCA origin  stenosis despite atherosclerosis. Intermittent calcified plaque in the left CCA without stenosis. Mostly calcified plaque at the left carotid bifurcation involving the left ICA origin and bulb. Maximal stenosis is at the distal bulb level estimated at 50 % with respect to the distal vessel (series 8, image 122). Vertebral arteries: No significant proximal right subclavian artery stenosis despite bulky calcified plaque. No right vertebral artery origin stenosis. Negative right vertebral  artery to the skull base. No proximal left subclavian artery stenosis despite atherosclerosis. Calcified plaque at the left vertebral artery origin resulting in moderate to severe stenosis as seen on series 9, image 129. Intermittent left V1 calcified plaque. But no other occasional left V2 and V3 calcified plaque significant stenosis to the skull base. CTA HEAD Posterior circulation: Patent distal vertebral arteries without stenosis. The left is mildly dominant. Mild left V4 calcified plaque. Patent right PICA origin. Dominant left AICA is patent. Patent vertebrobasilar junction. Patent basilar artery is diminutive, but without stenosis. SCA and PCA origins are patent. Posterior communicating arteries are diminutive or absent. No PCA stenosis or branch occlusion identified. Anterior circulation: Both ICA siphons are patent. Mild to moderate left siphon calcified plaque with only mild left cavernous segment stenosis. Mild to moderate right siphon calcified plaque. Mild right siphon supraclinoid segment stenosis. Patent carotid termini. Normal MCA and ACA origins. Anterior communicating artery is normal. The left ACA appears somewhat dominant throughout. Left MCA M1 segment, bifurcation, and left MCA branches are within normal limits. Right MCA M1 segment is patent with mild irregularity but no high-grade stenosis. Right MCA bifurcation and right MCA branches are within normal limits. Venous sinuses: Not evaluated due to early contrast phase. Anatomic variants: Mildly dominant left vertebral artery, left ACA. Review of the MIP images confirms the above findings IMPRESSION: 1. Negative for large vessel occlusion. This was preliminarily discussed by telephone with Dr. Kerney Elbe on 03/16/2018 at 2114 hours. 2. Positive for atherosclerosis in the head and neck, and Aortic Atherosclerosis (ICD10-I70.0). 3. 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 No hemodynamically  significant carotid or right vertebral stenosis. 4.  Emphysema (ICD10-J43.9). Electronically Signed   By: Genevie Ann M.D.   On: 03/16/2018 21:25   Ct Head Code Stroke Wo Contrast  Result Date: 03/16/2018 CLINICAL DATA:  Code stroke. 77 year old male with left side weakness and facial droop last seen normal 1840 hours. EXAM: CT HEAD WITHOUT CONTRAST TECHNIQUE: Contiguous axial images were obtained from the base of the skull through the vertex without intravenous contrast. COMPARISON:  None. FINDINGS: Brain: No midline shift, ventriculomegaly, mass effect, evidence of mass lesion, intracranial hemorrhage or evidence of cortically based acute infarction. No cortical encephalomalacia identified. Bilateral gray-white matter differentiation is within normal limits for age. Vascular: Calcified atherosclerosis at the skull base. No suspicious intracranial vascular hyperdensity. Skull: Negative. Sinuses/Orbits: Maxillary mucoperiosteal thickening greater on the left. Other paranasal sinuses are well pneumatized. Tympanic cavities and mastoids are clear. Other: Visualized orbit soft tissues are within normal limits. Visualized scalp soft tissues are within normal limits. ASPECTS [facility] Stroke Program Early CT Score) - Ganglionic level infarction (caudate, lentiform nuclei, internal capsule, insula, M1-M3 cortex): 7 - Supraganglionic infarction (M4-M6 cortex): 3 Total score (0-10 with 10 being normal): 10 IMPRESSION: 1. Negative for age. No acute cortically based infarct or intracranial hemorrhage is identified. 2. ASPECTS is 10. 3. Chronic maxillary sinusitis. 4. These results were communicated to Dr. Cheral Marker at 8:50 pmon 6/20/2019by text page via the [facility] messaging  system. Electronically Signed   By: Genevie Ann M.D.   On: 03/16/2018 20:51    STUDIES:   CULTURES: Blood cultures 6/21 > Wound culture 6/21 >  ANTIBIOTICS: Vanco 6/21 > Zosyn 6/21 >  SIGNIFICANT EVENTS:   LINES/TUBES:   DISCUSSION: 77 year old  admitted with acute CVA status post TPA, purulent diabetic foot ulcer on the left, sepsis  ASSESSMENT / PLAN: Diabetic foot ulcer Sepsis Start antibiotic coverage with Vanco, Zosyn Check blood and wound cultures, lactic acid, procalcitonin Consult orthopedics.   Foot x-ray.  Tachypnea.  Ex-smoker with no formal diagnosis of COPD Chest x-ray Supplemental oxygen, nebs as needed.  Tachy cardia, PVCs Elevated troponin.  Cardiology consulted by primary care Trend troponin, follow echocardiogram, BNP Repeat EKG  Acute CVA status post TPA Management per neurology  DM.  Will need better glucose control Change SSI to resistant scale May need insulin drip if blood sugars continue to be high.  Patient and family updated at bedside.  The patient is critically ill with multiple organ system failure and requires high complexity decision making for assessment and support, frequent evaluation and titration of therapies, advanced monitoring, review of radiographic studies and interpretation of complex data.   Critical Care Time devoted to patient care services, exclusive of separately billable procedures, described in this note is 35 minutes.   Marshell Garfinkel MD [facility] Pager [phone] If no answer or after 3pm call: [phone] 03/17/2018, 11:55 AM

## 2018-03-17 NOTE — Anesthesia Preprocedure Evaluation (Signed)
Anesthesia Evaluation  Patient identified by MRN, date of birth, ID band Patient confused    Reviewed: Allergy & Precautions, NPO status , Patient's Chart, lab work & pertinent test results  Airway Mallampati: II  TM Distance: >3 FB Neck ROM: Full    Dental  (+) Teeth Intact   Pulmonary    breath sounds clear to auscultation       Cardiovascular hypertension,  Rhythm:Regular Rate:Normal     Neuro/Psych    GI/Hepatic   Endo/Other  diabetes  Renal/GU      Musculoskeletal   Abdominal   Peds  Hematology   Anesthesia Other Findings   Reproductive/Obstetrics                             Anesthesia Physical Anesthesia Plan  ASA: III  Anesthesia Plan: General   Post-op Pain Management:    Induction: Intravenous  PONV Risk Score and Plan: Ondansetron  Airway Management Planned: Oral ETT  Additional Equipment:   Intra-op Plan:   Post-operative Plan: Extubation in OR  Informed Consent: I have reviewed the patients History and Physical, chart, labs and discussed the procedure including the risks, benefits and alternatives for the proposed anesthesia with the patient or authorized representative who has indicated his/her understanding and acceptance.     Plan Discussed with: CRNA and Anesthesiologist  Anesthesia Plan Comments:         Anesthesia Quick Evaluation

## 2018-03-17 NOTE — Anesthesia Postprocedure Evaluation (Signed)
Anesthesia Post Note  Patient: Issac Moure  Procedure(s) Performed: IRRIGATION AND DEBRIDEMENT FOOT (Left Foot)     Patient location during evaluation: PACU Anesthesia Type: General Level of consciousness: awake and alert Pain management: pain level controlled Vital Signs Assessment: post-procedure vital signs reviewed and stable Respiratory status: spontaneous breathing, nonlabored ventilation, respiratory function stable and patient connected to nasal cannula oxygen Cardiovascular status: blood pressure returned to baseline and stable Postop Assessment: no apparent nausea or vomiting Anesthetic complications: no    Last Vitals:  Vitals:   03/17/18 2200 03/17/18 2230  BP: (!) 160/80 (!) 150/116  Pulse: (!) 56 (!) 39  Resp: (!) 33 (!) 41  Temp:    SpO2: 100% 95%    Last Pain:  Vitals:   03/17/18 2130  TempSrc:   PainSc: 0-No pain    LLE Motor Response: Purposeful movement;Responds to commands (03/17/18 2300)   RLE Motor Response: Purposeful movement;Responds to commands (03/17/18 2300)        JOSLIN,DAVID COKER

## 2018-03-17 NOTE — Anesthesia Procedure Notes (Signed)
Procedure Name: Intubation Date/Time: 03/17/2018 8:03 PM Performed by: Claris Che, CRNA Pre-anesthesia Checklist: Patient identified, Emergency Drugs available, Suction available, Patient being monitored and Timeout performed Patient Re-evaluated:Patient Re-evaluated prior to induction Oxygen Delivery Method: Circle system utilized Preoxygenation: Pre-oxygenation with 100% oxygen Induction Type: IV induction, Rapid sequence and Cricoid Pressure applied Laryngoscope Size: Mac and 4 Grade View: Grade II Tube type: Oral Tube size: 7.5 mm Number of attempts: 1 Airway Equipment and Method: Stylet Placement Confirmation: ETT inserted through vocal cords under direct vision,  positive ETCO2 and breath sounds checked- equal and bilateral Secured at: 22 cm Tube secured with: Tape Dental Injury: Teeth and Oropharynx as per pre-operative assessment

## 2018-03-17 NOTE — Progress Notes (Addendum)
Neurology Progress Note    Chief Complaint: Acute onset of left sided weakness, left facial droop and dysphasia  HPI: Kevin [name] is an 77 y.o. male who presented with acute onset of left sided weakness, left facial droop and dysphasia. He was treated with IV Alteplase on 03/16/18.   Interval Hx:  Significantly SOB this morning, 02 sat good. He has been having arrhythmias. TM has only been 100.3, but he is symptomatic, pale and diaphoretic. Holding good BPs at this time. He appears to be septic with oozing foul smelling wound on left foot. Stat consult to CCM.  Home Medications:    ROS: As per HPI. Pt is confused and cannot complete at this time  Physical Examination: Blood pressure 139/65, pulse 94, temperature 100.3 F (37.9 C), temperature source Axillary, resp. rate (!) 27, height '6\' 1"'  (1.854 m), weight 215 lb 9.8 oz (97.8 kg), SpO2 95 %.  HEENT: [place]/AT Lungs: Respirations unlabored Ext: LLE has swelling, redness under foot and reaching around to top of foot/ankle. There is a oozing and foul smelling wound on left bottom of foot, this tunnels to bone.  Neurologic Examination: Mental Status: Alert, can answer his name, but is confused and gives incorrect answers to rest of questions. He cannot complete a sentence without becoming sob. Speech is dysarthric. He poorly attends and has difficulty following commands.  Cranial Nerves: II:  Visual fields intact without extinction to DSS. PERRL.  III,IV, VI: EOMI, PERRL. No ptosis.  V,VII: Subtle left facial droop. Facial temp sensation equal bilaterally VIII: hearing intact to voice IX,X: Palate rises symmetrically XI: Head at midline XII: midline tongue extension  Motor: RUE: 5/5 LUE: 3/5 proximal and distal.drift present. Significant weakness of left grip and intrinsic hand muscles with inability to extend left wrist. RLE 5/5 LLE: 4+/5 Sensory: Decreased sensation in left foot. Positive extinction to FT RUE and RLE.  Deep  Tendon Reflexes:  1+ bilateral brachioradialis and biceps 1+ patellae bilaterally 0 achilles bilaterally Plantars: Right: [place]   Left: tonically upgoing Cerebellar: Prominent ataxia with FNF on left. No definite ataxia with H-S bilaterally. Prominent action tremor bilateral upper ext.  Gait: Deferred due to acuity of presentation   Results for orders placed or performed during the hospital encounter of 03/16/18 (from the past 48 hour(s))  Ethanol     Status: None   Collection Time: 03/16/18  8:32 PM  Result Value Ref Range   Alcohol, Ethyl (B) <10 <10 mg/dL    Comment: (NOTE) Lowest detectable limit for serum alcohol is 10 mg/dL. For medical purposes only. Performed at [facility], [place] [address], [place], [place] [address]   Protime-INR     Status: Abnormal   Collection Time: 03/16/18  8:32 PM  Result Value Ref Range   Prothrombin Time 15.4 (H) 11.4 - 15.2 seconds   INR 1.23     Comment: Performed at [facility] [address], [place],  [address]  APTT     Status: None   Collection Time: 03/16/18  8:32 PM  Result Value Ref Range   aPTT 32 24 - 36 seconds    Comment: Performed at [facility] [address], [place] [address]  CBC     Status: Abnormal   Collection Time: 03/16/18  8:32 PM  Result Value Ref Range   WBC 25.9 (H) 4.0 - 10.5 K/uL   RBC 4.48 4.22 - 5.81 MIL/uL   Hemoglobin 13.1 13.0 - 17.0  g/dL   HCT 39.4 39.0 - 52.0 %   MCV 87.9 78.0 - 100.0 fL   MCH 29.2 26.0 - 34.0 pg   MCHC 33.2 30.0 - 36.0 g/dL   RDW 12.3 11.5 - 15.5 %   Platelets 274 150 - 400 K/uL    Comment: Performed at [facility], [place] [address] [place], [place] [address]  Differential     Status: Abnormal   Collection Time: 03/16/18  8:32 PM  Result Value Ref Range   Neutrophils Relative % 92 %   Lymphocytes Relative 3 %   Monocytes Relative 5 %   Eosinophils Relative 0 %   Basophils Relative 0 %   Neutro Abs 23.8 (H) 1.7 - 7.7 K/uL   Lymphs Abs  0.8 0.7 - 4.0 K/uL   Monocytes Absolute 1.3 (H) 0.1 - 1.0 K/uL   Eosinophils Absolute 0.0 0.0 - 0.7 K/uL   Basophils Absolute 0.0 0.0 - 0.1 K/uL   WBC Morphology MILD LEFT SHIFT (1-5% METAS, OCC MYELO, OCC BANDS)     Comment: Performed at [facility] [address] [place], [place] [address]  Comprehensive metabolic panel     Status: Abnormal   Collection Time: 03/16/18  8:32 PM  Result Value Ref Range   Sodium 126 (L) 135 - 145 mmol/L   Potassium 4.7 3.5 - 5.1 mmol/L   Chloride 90 (L) 101 - 111 mmol/L   CO2 18 (L) 22 - 32 mmol/L   Glucose, Bld 304 (H) 65 - 99 mg/dL   BUN 33 (H) 6 - 20 mg/dL   Creatinine, Ser 2.17 (H) 0.61 - 1.24 mg/dL   Calcium 8.3 (L) 8.9 - 10.3 mg/dL   Total Protein 6.8 6.5 - 8.1 g/dL   Albumin 2.7 (L) 3.5 - 5.0 g/dL   AST 62 (H) 15 - 41 U/L   ALT 46 17 - 63 U/L   Alkaline Phosphatase 117 38 - 126 U/L   Total Bilirubin 1.1 0.3 - 1.2 mg/dL   GFR calc non Af Amer 28 (L) >60 mL/min   GFR calc Af Amer 33 (L) >60 mL/min    Comment: (NOTE) The eGFR has been calculated using the CKD EPI equation. This calculation has not been validated in all clinical situations. eGFR's persistently <60 mL/min signify possible Chronic Kidney Disease.    Anion gap 18 (H) 5 - 15    Comment: Performed at [facility], [place] [address], [place], [place] [address]  I-Stat Chem 8, ED     Status: Abnormal   Collection Time: 03/16/18  8:39 PM  Result Value Ref Range   Sodium 124 (L) 135 - 145 mmol/L   Potassium 4.4 3.5 - 5.1 mmol/L   Chloride 90 (L) 101 - 111 mmol/L   BUN 32 (H) 6 - 20 mg/dL   Creatinine, Ser 1.90 (H) 0.61 - 1.24 mg/dL   Glucose, Bld 312 (H) 65 - 99 mg/dL   Calcium, Ion 0.95 (L) 1.15 - 1.40 mmol/L   TCO2 18 (L) 22 - 32 mmol/L   Hemoglobin 13.6 13.0 - 17.0 g/dL   HCT 40.0 39.0 - 52.0 %  I-stat troponin, ED     Status: None   Collection Time: 03/16/18  8:42 PM  Result Value Ref Range   Troponin i, poc 0.08 0.00 - 0.08 ng/mL   Comment 3             Comment: Due to the release kinetics of cTnI, a  negative result within the first hours of the onset of symptoms does not rule out myocardial infarction with certainty. If myocardial infarction is still suspected, repeat the test at appropriate intervals.   Urine rapid drug screen (hosp performed)     Status: Abnormal   Collection Time: 03/16/18  9:25 PM  Result Value Ref Range   Opiates NONE DETECTED NONE DETECTED   Cocaine NONE DETECTED NONE DETECTED   Benzodiazepines NONE DETECTED NONE DETECTED   Amphetamines NONE DETECTED NONE DETECTED   Tetrahydrocannabinol NONE DETECTED NONE DETECTED   Barbiturates (A) NONE DETECTED    Result not available. Reagent lot number recalled by manufacturer.    Comment: Performed at [facility], [place] [address], [place], [place] [address]  Urinalysis, Routine w reflex microscopic     Status: Abnormal   Collection Time: 03/16/18  9:25 PM  Result Value Ref Range   Color, Urine YELLOW YELLOW   APPearance HAZY (A) CLEAR   Specific Gravity, Urine 1.011 1.005 - 1.030   pH 6.0 5.0 - 8.0   Glucose, UA >=500 (A) NEGATIVE mg/dL   Hgb urine dipstick LARGE (A) NEGATIVE   Bilirubin Urine NEGATIVE NEGATIVE   Ketones, ur 5 (A) NEGATIVE mg/dL   Protein, ur 30 (A) NEGATIVE mg/dL   Nitrite NEGATIVE NEGATIVE   Leukocytes, UA LARGE (A) NEGATIVE   RBC / HPF 6-10 0 - 5 RBC/hpf   WBC, UA 11-20 0 - 5 WBC/hpf   Bacteria, UA RARE (A) NONE SEEN   Squamous Epithelial / LPF 0-5 0 - 5    Comment: Performed at [facility], [place] [address], [place], [place] [address]  MRSA PCR Screening     Status: None   Collection Time: 03/16/18 10:37 PM  Result Value Ref Range   MRSA by PCR NEGATIVE NEGATIVE    Comment:        The GeneXpert MRSA Assay (FDA approved for NASAL specimens only), is one component of a comprehensive MRSA colonization surveillance program. It is not intended to diagnose MRSA infection nor to guide or monitor treatment for MRSA  infections. Performed at [facility], [place] [address] [place], [place] [address]   CK total and CKMB (cardiac)not at [facility]     Status: Abnormal   Collection Time: 03/16/18 11:36 PM  Result Value Ref Range   Total CK 2,084 (H) 49 - 397 U/L   CK, MB 13.1 (H) 0.5 - 5.0 ng/mL   Relative Index 0.6 0.0 - 2.5    Comment: Performed at [facility] [address] [place], [place] [address]  Troponin I (q 6hr x 3)     Status: Abnormal   Collection Time: 03/16/18 11:36 PM  Result Value Ref Range   Troponin I 0.22 (HH) <0.03 ng/mL    Comment: CRITICAL RESULT CALLED TO, READ BACK BY AND VERIFIED WITH: BARRIER D,RN 03/17/18 0059 WAYK Performed at [facility], [place] [address], [place],  [address]   Hemoglobin A1c     Status: Abnormal   Collection Time: 03/17/18  3:39 AM  Result Value Ref Range   Hgb A1c MFr Bld 7.6 (H) 4.8 - 5.6 %    Comment: (NOTE) Pre diabetes:          5.7%-6.4% Diabetes:              >6.4% Glycemic control for   <7.0% adults with diabetes    Mean Plasma Glucose 171.42 mg/dL  Comment: Performed at [facility], [place] [address], [place], [place] [address]  Lipid panel     Status: Abnormal   Collection Time: 03/17/18  3:39 AM  Result Value Ref Range   Cholesterol 75 0 - 200 mg/dL   Triglycerides 114 <150 mg/dL   HDL 13 (L) >40 mg/dL   Total CHOL/HDL Ratio 5.8 RATIO   VLDL 23 0 - 40 mg/dL   LDL Cholesterol 39 0 - 99 mg/dL    Comment:        Total Cholesterol/HDL:CHD Risk Coronary Heart Disease Risk Table                     Men   Women  1/2 Average Risk   3.4   3.3  Average Risk       5.0   4.4  2 X Average Risk   9.6   7.1  3 X Average Risk  23.4   11.0        Use the calculated Patient Ratio above and the CHD Risk Table to determine the patient's CHD Risk.        ATP III CLASSIFICATION (LDL):  <100     mg/dL   Optimal  100-129  mg/dL   Near or Above                    Optimal  130-159  mg/dL   Borderline  160-189  mg/dL    High  >190     mg/dL   Very High Performed at [facility] [address], [place], [place] [address]   Glucose, capillary     Status: Abnormal   Collection Time: 03/17/18  7:27 AM  Result Value Ref Range   Glucose-Capillary 205 (H) 65 - 99 mg/dL  CBC with Differential/Platelet     Status: Abnormal   Collection Time: 03/17/18  8:46 AM  Result Value Ref Range   WBC 27.7 (H) 4.0 - 10.5 K/uL   RBC 4.07 (L) 4.22 - 5.81 MIL/uL   Hemoglobin 11.9 (L) 13.0 - 17.0 g/dL   HCT 35.5 (L) 39.0 - 52.0 %   MCV 87.2 78.0 - 100.0 fL   MCH 29.2 26.0 - 34.0 pg   MCHC 33.5 30.0 - 36.0 g/dL   RDW 12.4 11.5 - 15.5 %   Platelets 279 150 - 400 K/uL   Neutrophils Relative % 90 %   Neutro Abs 25.0 (H) 1.7 - 7.7 K/uL   Lymphocytes Relative 4 %   Lymphs Abs 1.0 0.7 - 4.0 K/uL   Monocytes Relative 5 %   Monocytes Absolute 1.4 (H) 0.1 - 1.0 K/uL   Eosinophils Relative 0 %   Eosinophils Absolute 0.0 0.0 - 0.7 K/uL   Basophils Relative 0 %   Basophils Absolute 0.1 0.0 - 0.1 K/uL   Immature Granulocytes 1 %   Abs Immature Granulocytes 0.2 (H) 0.0 - 0.1 K/uL    Comment: Performed at [facility], [address], [place], [place] [address]  Glucose, capillary     Status: Abnormal   Collection Time: 03/17/18 11:45 AM  Result Value Ref Range   Glucose-Capillary 276 (H) 65 - 99 mg/dL   Ct Angio Head W Or Wo Contrast  Result Date: 03/16/2018 CLINICAL DATA:  77 year old male code stroke, left side symptoms. EXAM: CT ANGIOGRAPHY HEAD AND NECK TECHNIQUE: Multidetector CT imaging of the head and neck was performed using the standard protocol during bolus  administration of intravenous contrast. Multiplanar CT image reconstructions and MIPs were obtained to evaluate the vascular anatomy. Carotid stenosis measurements (when applicable) are obtained utilizing NASCET criteria, using the distal internal carotid diameter as the denominator. CONTRAST:  21m ISOVUE-370 IOPAMIDOL (ISOVUE-370) INJECTION 76%  COMPARISON:  Head CT without contrast 2041 hours today. FINDINGS: CTA NECK Skeleton: Advanced cervical spine degeneration, especially left side facet arthropathy. Maxillary sinus mucoperiosteal thickening redemonstrated, greater on the left. Dental inflammatory periapical lucency occasionally noted. Chronic distal right clavicle fracture. No acute osseous abnormality identified. Upper chest: Centrilobular emphysema. No superior mediastinal lymphadenopathy. Other neck: Negative.  No neck mass or lymphadenopathy. Aortic arch: Calcified aortic atherosclerosis. 3 vessel arch configuration. Right carotid system: No brachiocephalic artery or right CCA origin stenosis despite atherosclerosis. Intermittent calcified plaque in the right CCA proximal to the bifurcation without stenosis. Mostly calcified plaque at the right carotid bifurcation and right ICA origin. Less than 50 % stenosis with respect to the distal vessel. Left carotid system: No left CCA origin stenosis despite atherosclerosis. Intermittent calcified plaque in the left CCA without stenosis. Mostly calcified plaque at the left carotid bifurcation involving the left ICA origin and bulb. Maximal stenosis is at the distal bulb level estimated at 50 % with respect to the distal vessel (series 8, image 122). Vertebral arteries: No significant proximal right subclavian artery stenosis despite bulky calcified plaque. No right vertebral artery origin stenosis. Negative right vertebral artery to the skull base. No proximal left subclavian artery stenosis despite atherosclerosis. Calcified plaque at the left vertebral artery origin resulting in moderate to severe stenosis as seen on series 9, image 129. Intermittent left V1 calcified plaque. But no other occasional left V2 and V3 calcified plaque significant stenosis to the skull base. CTA HEAD Posterior circulation: Patent distal vertebral arteries without stenosis. The left is mildly dominant. Mild left V4 calcified  plaque. Patent right PICA origin. Dominant left AICA is patent. Patent vertebrobasilar junction. Patent basilar artery is diminutive, but without stenosis. SCA and PCA origins are patent. Posterior communicating arteries are diminutive or absent. No PCA stenosis or branch occlusion identified. Anterior circulation: Both ICA siphons are patent. Mild to moderate left siphon calcified plaque with only mild left cavernous segment stenosis. Mild to moderate right siphon calcified plaque. Mild right siphon supraclinoid segment stenosis. Patent carotid termini. Normal MCA and ACA origins. Anterior communicating artery is normal. The left ACA appears somewhat dominant throughout. Left MCA M1 segment, bifurcation, and left MCA branches are within normal limits. Right MCA M1 segment is patent with mild irregularity but no high-grade stenosis. Right MCA bifurcation and right MCA branches are within normal limits. Venous sinuses: Not evaluated due to early contrast phase. Anatomic variants: Mildly dominant left vertebral artery, left ACA. Review of the MIP images confirms the above findings IMPRESSION: 1. Negative for large vessel occlusion. This was preliminarily discussed by telephone with Dr. EKerney Elbeon 03/16/2018 at 2114 hours. 2. Positive for atherosclerosis in the head and neck, and Aortic Atherosclerosis (ICD10-I70.0). 3. 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 No hemodynamically significant carotid or right vertebral stenosis. 4.  Emphysema (ICD10-J43.9). Electronically Signed   By: HGenevie AnnM.D.   On: 03/16/2018 21:25   Ct Angio Neck W Or Wo Contrast  Result Date: 03/16/2018 CLINICAL DATA:  77year old male code stroke, left side symptoms. EXAM: CT ANGIOGRAPHY HEAD AND NECK TECHNIQUE: Multidetector CT imaging of the head and neck was performed using the standard protocol during  bolus administration of intravenous contrast. Multiplanar CT image reconstructions  and MIPs were obtained to evaluate the vascular anatomy. Carotid stenosis measurements (when applicable) are obtained utilizing NASCET criteria, using the distal internal carotid diameter as the denominator. CONTRAST:  49m ISOVUE-370 IOPAMIDOL (ISOVUE-370) INJECTION 76% COMPARISON:  Head CT without contrast 2041 hours today. FINDINGS: CTA NECK Skeleton: Advanced cervical spine degeneration, especially left side facet arthropathy. Maxillary sinus mucoperiosteal thickening redemonstrated, greater on the left. Dental inflammatory periapical lucency occasionally noted. Chronic distal right clavicle fracture. No acute osseous abnormality identified. Upper chest: Centrilobular emphysema. No superior mediastinal lymphadenopathy. Other neck: Negative.  No neck mass or lymphadenopathy. Aortic arch: Calcified aortic atherosclerosis. 3 vessel arch configuration. Right carotid system: No brachiocephalic artery or right CCA origin stenosis despite atherosclerosis. Intermittent calcified plaque in the right CCA proximal to the bifurcation without stenosis. Mostly calcified plaque at the right carotid bifurcation and right ICA origin. Less than 50 % stenosis with respect to the distal vessel. Left carotid system: No left CCA origin stenosis despite atherosclerosis. Intermittent calcified plaque in the left CCA without stenosis. Mostly calcified plaque at the left carotid bifurcation involving the left ICA origin and bulb. Maximal stenosis is at the distal bulb level estimated at 50 % with respect to the distal vessel (series 8, image 122). Vertebral arteries: No significant proximal right subclavian artery stenosis despite bulky calcified plaque. No right vertebral artery origin stenosis. Negative right vertebral artery to the skull base. No proximal left subclavian artery stenosis despite atherosclerosis. Calcified plaque at the left vertebral artery origin resulting in moderate to severe stenosis as seen on series 9, image 129.  Intermittent left V1 calcified plaque. But no other occasional left V2 and V3 calcified plaque significant stenosis to the skull base. CTA HEAD Posterior circulation: Patent distal vertebral arteries without stenosis. The left is mildly dominant. Mild left V4 calcified plaque. Patent right PICA origin. Dominant left AICA is patent. Patent vertebrobasilar junction. Patent basilar artery is diminutive, but without stenosis. SCA and PCA origins are patent. Posterior communicating arteries are diminutive or absent. No PCA stenosis or branch occlusion identified. Anterior circulation: Both ICA siphons are patent. Mild to moderate left siphon calcified plaque with only mild left cavernous segment stenosis. Mild to moderate right siphon calcified plaque. Mild right siphon supraclinoid segment stenosis. Patent carotid termini. Normal MCA and ACA origins. Anterior communicating artery is normal. The left ACA appears somewhat dominant throughout. Left MCA M1 segment, bifurcation, and left MCA branches are within normal limits. Right MCA M1 segment is patent with mild irregularity but no high-grade stenosis. Right MCA bifurcation and right MCA branches are within normal limits. Venous sinuses: Not evaluated due to early contrast phase. Anatomic variants: Mildly dominant left vertebral artery, left ACA. Review of the MIP images confirms the above findings IMPRESSION: 1. Negative for large vessel occlusion. This was preliminarily discussed by telephone with Dr. EKerney Elbeon 03/16/2018 at 2114 hours. 2. Positive for atherosclerosis in the head and neck, and Aortic Atherosclerosis (ICD10-I70.0). 3. 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 No hemodynamically significant carotid or right vertebral stenosis. 4.  Emphysema (ICD10-J43.9). Electronically Signed   By: HGenevie AnnM.D.   On: 03/16/2018 21:25   Ct Head Code Stroke Wo Contrast  Result Date: 03/16/2018 CLINICAL DATA:  Code  stroke. 77year old male with left side weakness and facial droop last seen normal 1840 hours. EXAM: CT HEAD WITHOUT CONTRAST TECHNIQUE: Contiguous axial images were obtained from  the base of the skull through the vertex without intravenous contrast. COMPARISON:  None. FINDINGS: Brain: No midline shift, ventriculomegaly, mass effect, evidence of mass lesion, intracranial hemorrhage or evidence of cortically based acute infarction. No cortical encephalomalacia identified. Bilateral gray-white matter differentiation is within normal limits for age. Vascular: Calcified atherosclerosis at the skull base. No suspicious intracranial vascular hyperdensity. Skull: Negative. Sinuses/Orbits: Maxillary mucoperiosteal thickening greater on the left. Other paranasal sinuses are well pneumatized. Tympanic cavities and mastoids are clear. Other: Visualized orbit soft tissues are within normal limits. Visualized scalp soft tissues are within normal limits. ASPECTS [facility] Stroke Program Early CT Score) - Ganglionic level infarction (caudate, lentiform nuclei, internal capsule, insula, M1-M3 cortex): 7 - Supraganglionic infarction (M4-M6 cortex): 3 Total score (0-10 with 10 being normal): 10 IMPRESSION: 1. Negative for age. No acute cortically based infarct or intracranial hemorrhage is identified. 2. ASPECTS is 10. 3. Chronic maxillary sinusitis. 4. These results were communicated to Dr. Cheral Marker at 8:50 pmon 6/20/2019by text page via the [facility] messaging system. Electronically Signed   By: Genevie Ann M.D.   On: 03/16/2018 20:51    Assessment: 77 y.o. male presenting with acute right hemispheric stroke symptoms, treated with IV tPA. He has a tunneling wound in the left foot.and likely sepsis related to that  # Acute Stroke symptoms: Dysphasia has resolved except for minor naming and comprehension deficit. Prominent LUE weakness and subtle LLE weakness still present.   -MRI pending for better etiology but will have to hold given  his worsening medical status  -LDL 39, much under goal <70. Continue statin    -CTA head/neck: no LVO. 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 Carotids show no stenosis.  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 SSI during admit, hold po hm meds # History of ulcerative colitis. On Mesalamine.  # Left foot wound- wound care has changed dressing and this is currently oozing a foul odor and has become quite red/swollen overnight. There is tunneling down to bone exposed. Ortho consulted, possible trip to OR for washout ASAP given this is likely the leading cause of his septic decline seen today. Wound cx ordered. Left foot Xray ordered. # Leukocytosis 25.9 and now with low gr fever- ID and ortho consulted. Sepsis work up and tx started   Plan: Changed MRI brain to timed for 1600 TTE done at bedside, read pending. D/w cardiology, they will order TEE if suspicious for endocarditis No antiplatelet medications or anticoagulants for at least 24 hours following tPA. Can start ASA if repeat CT is negative for hemorrhagic conversion.   Keep DVT prophylaxis with SCDs as he may go to surgery tonight Rehab evals when able, currently not able to participate as he is decompensating quickly Stat Bld cx, Sepsis wk up IV Fluid bolus stat Start Vanco and Zosyn, will as ID to adjust abx as needed pending septic wk up  I have called and discussed case with several consults urgently: CCM for SOB and sepsis mgt. ID and Ortho consulted for left foot wound and likely surgical debridement. Ideally it would be desired to take to the OR for wash out and packing post tPA 24hrs.  Cardiology for arrhythmia and eval for endocarditis. Echo at bedside, will await results. There is concern at this time for septic source of embolic stroke. He will need TEE if TTE is unrevieling.  This pt is critically ill and needs to remain in the ICU at this time. He has respiratory, cardiac and neuro instability  as  well as signs of sepsis beginning at this time.   24mn spent cc time thus far. Above plan d/w Dr SLeonie Man attending neurologist.  I have personally examined this patient, reviewed notes, independently viewed imaging studies, participated in medical decision making and plan of care.ROS completed by me personally and pertinent positives fully documented  I have made any additions or clarifications directly to the above note. Agree with note above. he presented with the left hemiparesis secondary to right brain subcortical infarct and was treated with IV TPA and is up 10 modest clinical improvement but has had medical worsening this morning due to shortness of breath and has an infected foot wound which may be contributing to ongoing sepsis. Consulted pulmonary critical care teams, cardiology and infectious disease. Orthopedic team plan to take him to the OR for wound debridement later today. May need to change MRI to CT scan this evening as he may not be able to tolerate it and the present medical situation. Continue antibiotics for treatment of sepsis and TEE later to evaluate for endocarditis. Discussed with wife at the bedside and with Dr. MVaughan Brownercritical care medicine.This patient is critically ill and at significant risk of neurological worsening, death and care requires constant monitoring of vital signs, hemodynamics,respiratory and cardiac monitoring, extensive review of multiple databases, frequent neurological assessment, discussion with family, other specialists and medical decision making of high complexity.I have made any additions or clarifications directly to the above note.This critical care time does not reflect procedure time, or teaching time or supervisory time of PA/NP/Med Resident etc but could involve care discussion time.  I spent 80 minutes of neurocritical care time  in the care of  this patient.      PAntony Contras MD Medical Director [facility] Pager:  [phone]/21/2019 3:50 PM

## 2018-03-18 ENCOUNTER — Encounter (HOSPITAL_COMMUNITY): Payer: Self-pay | Admitting: Orthopedic Surgery

## 2018-03-18 ENCOUNTER — Inpatient Hospital Stay (HOSPITAL_COMMUNITY): Payer: Medicare Other

## 2018-03-18 DIAGNOSIS — R5081 Fever presenting with conditions classified elsewhere: Secondary | ICD-10-CM

## 2018-03-18 DIAGNOSIS — I6389 Other cerebral infarction: Secondary | ICD-10-CM

## 2018-03-18 DIAGNOSIS — R4182 Altered mental status, unspecified: Secondary | ICD-10-CM

## 2018-03-18 DIAGNOSIS — D72829 Elevated white blood cell count, unspecified: Secondary | ICD-10-CM

## 2018-03-18 DIAGNOSIS — M869 Osteomyelitis, unspecified: Secondary | ICD-10-CM

## 2018-03-18 LAB — MAGNESIUM: Magnesium: 2 mg/dL (ref 1.7–2.4)

## 2018-03-18 LAB — GLUCOSE, CAPILLARY
Glucose-Capillary: 106 mg/dL — ABNORMAL HIGH (ref 65–99)
Glucose-Capillary: 118 mg/dL — ABNORMAL HIGH (ref 65–99)
Glucose-Capillary: 171 mg/dL — ABNORMAL HIGH (ref 65–99)
Glucose-Capillary: 146 mg/dL — ABNORMAL HIGH (ref 65–99)
Glucose-Capillary: 169 mg/dL — ABNORMAL HIGH (ref 65–99)
Glucose-Capillary: 178 mg/dL — ABNORMAL HIGH (ref 65–99)

## 2018-03-18 LAB — CBC
HCT: 31.8 % — ABNORMAL LOW (ref 39.0–52.0)
Hemoglobin: 10.4 g/dL — ABNORMAL LOW (ref 13.0–17.0)
MCH: 29.3 pg (ref 26.0–34.0)
MCHC: 32.7 g/dL (ref 30.0–36.0)
MCV: 89.6 fL (ref 78.0–100.0)
Platelets: 249 10*3/uL (ref 150–400)
RBC: 3.55 MIL/uL — ABNORMAL LOW (ref 4.22–5.81)
RDW: 12.5 % (ref 11.5–15.5)
WBC: 19.7 10*3/uL — ABNORMAL HIGH (ref 4.0–10.5)

## 2018-03-18 LAB — RENAL FUNCTION PANEL
Albumin: 2 g/dL — ABNORMAL LOW (ref 3.5–5.0)
Anion gap: 12 (ref 5–15)
BUN: 25 mg/dL — ABNORMAL HIGH (ref 6–20)
CO2: 22 mmol/L (ref 22–32)
Calcium: 7.2 mg/dL — ABNORMAL LOW (ref 8.9–10.3)
Chloride: 101 mmol/L (ref 101–111)
Creatinine, Ser: 1.52 mg/dL — ABNORMAL HIGH (ref 0.61–1.24)
GFR calc Af Amer: 50 mL/min — ABNORMAL LOW (ref >60)
GFR calc non Af Amer: 43 mL/min — ABNORMAL LOW (ref >60)
Glucose, Bld: 103 mg/dL — ABNORMAL HIGH (ref 65–99)
Phosphorus: 3.2 mg/dL (ref 2.5–4.6)
Potassium: 3.5 mmol/L (ref 3.5–5.1)
Sodium: 135 mmol/L (ref 135–145)

## 2018-03-18 LAB — TROPONIN I: Troponin I: 0.11 ng/mL (ref <0.03)

## 2018-03-18 LAB — PROCALCITONIN: Procalcitonin: 17.79 ng/mL

## 2018-03-18 MED ORDER — IPRATROPIUM-ALBUTEROL 0.5-2.5 (3) MG/3ML IN SOLN
3.0000 mL | Freq: Three times a day (TID) | RESPIRATORY_TRACT | Status: AC
  Administered 2018-03-18: 3 mL via RESPIRATORY_TRACT
  Filled 2018-03-18 (×3): qty 3

## 2018-03-18 MED ORDER — FAMOTIDINE 20 MG PO TABS
20.0000 mg | ORAL_TABLET | Freq: Two times a day (BID) | ORAL | Status: DC
  Administered 2018-03-18 – 2018-03-27 (×18): 20 mg via ORAL
  Filled 2018-03-18 (×18): qty 1

## 2018-03-18 MED ORDER — ASPIRIN EC 81 MG PO TBEC
81.0000 mg | DELAYED_RELEASE_TABLET | Freq: Every day | ORAL | Status: DC
  Administered 2018-03-18 – 2018-03-20 (×3): 81 mg via ORAL
  Filled 2018-03-18 (×3): qty 1

## 2018-03-18 MED ORDER — ASPIRIN EC 81 MG PO TBEC
81.0000 mg | DELAYED_RELEASE_TABLET | Freq: Once | ORAL | Status: AC
  Administered 2018-03-18: 81 mg via ORAL
  Filled 2018-03-18: qty 1

## 2018-03-18 NOTE — Progress Notes (Signed)
Name: Kevin [name] MRN: [id number] DOB: Nov 15, 1940    ADMISSION DATE:  03/16/2018 CONSULTATION DATE:  03/17/2018  REFERRING MD :  Leonie Man  CHIEF COMPLAINT:  Shortness of breath and infected foot   BRIEF PATIENT DESCRIPTION: Kevin [name] is a 77 y.o. male who presented with an acute right hemispheric stroke treated with IV tPA and showing neurological improvement. 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 Neurology has conducted many imaging studies to rule out potential contributing factors for his stroke. ID has been consulted for sepsis management and have tailored antibiotic therapy. His respiratory status remains stable on minimal oxygen supplementation via nasal cannula without concern for additional interventions needed.   SIGNIFICANT EVENTS  -Status post operative debridement of left foot per Ortho -Still on Vanc/Zosyn per ID recs -TTE negative for any vegitations, primary team considering utility of TEE for further workup of potential etiology of stroke  -Stable on [place] with appropriate saturations   STUDIES:  ECHO 6/21 Study Conclusions  - Left ventricle: The cavity size was normal. Systolic function was   normal. The estimated ejection fraction was in the range of 60%   to 65%. Wall motion was normal; there were no regional wall   motion abnormalities. There was no evidence of elevated   ventricular filling pressure by Doppler parameters. - Left atrium: The atrium was moderately dilated.  Impressions:  - No cardiac source of emboli was indentified.    PAST MEDICAL HISTORY :   has a past medical history of Diabetes mellitus without complication (Kevin [name]) and Hypertension.  has a past surgical history that includes Wrist surgery and I&D extremity (Left, 03/17/2018). Prior to Admission medications   Medication Sig Start Date End Date Taking? Authorizing Provider    aspirin EC 81 MG tablet Take 81 mg by mouth daily.   Yes [provider]  canagliflozin (INVOKANA) 100 MG TABS tablet Take 100 mg by mouth daily before breakfast.   Yes [provider]  FLUoxetine (PROZAC) 20 MG capsule Take 20 mg by mouth daily.   Yes [provider]  glimepiride (AMARYL) 4 MG tablet Take 2 mg by mouth daily with breakfast.   Yes [provider]  mesalamine (APRISO) 0.375 g 24 hr capsule Take 750 mg by mouth 2 (two) times daily.    Yes [provider]  metFORMIN (GLUCOPHAGE) 500 MG tablet Take 1,000 mg by mouth 2 (two) times daily with a meal.    Yes [provider]  metoprolol succinate (TOPROL-XL) 100 MG 24 hr tablet Take 100 mg by mouth daily. Take with or immediately following a meal.   Yes [provider]  Multiple Vitamin (MULTIVITAMIN WITH MINERALS) TABS tablet Take 1 tablet by mouth daily.   Yes [provider]  rosuvastatin (CRESTOR) 10 MG tablet Take 10 mg by mouth daily.   Yes [provider]  sitaGLIPtin (JANUVIA) 100 MG tablet Take 100 mg by mouth daily.   Yes [provider]  tamsulosin (FLOMAX) 0.4 MG CAPS capsule Take 0.4 mg by mouth daily after breakfast.  02/27/18  Yes [provider]   Allergies  Allergen Reactions  . Sulfa Antibiotics Rash    FAMILY HISTORY:  family history includes Hypertension in his father. SOCIAL HISTORY:  reports that he has never smoked. He has never used smokeless tobacco. He reports that he drinks alcohol. He reports that he does not use drugs.  REVIEW OF SYSTEMS:   Constitutional: Negative for fever, chills, weight loss, malaise/fatigue and diaphoresis.  HENT: Negative for hearing loss, ear pain, nosebleeds, congestion, sore throat, neck pain, tinnitus and ear discharge.   Eyes: Negative for blurred vision, double vision, photophobia, pain, discharge and redness.  Respiratory: Negative for cough, hemoptysis, sputum production,  (improved) shortness of breath, wheezing and stridor.   Cardiovascular: Negative for chest pain, palpitations, orthopnea, claudication, leg swelling and PND.  Gastrointestinal: Negative for heartburn, nausea, vomiting, abdominal pain, diarrhea, constipation, blood in stool and melena.  Genitourinary: Negative for dysuria, urgency, frequency, hematuria and flank pain.  Musculoskeletal: Pain in left foot.  Skin: Pain in left foot Neurological: Negative for new dizziness, HA, or vision changes.  Endo/Heme/Allergies: Negative for environmental allergies and polydipsia. Does not bruise/bleed easily.  SUBJECTIVE:  Patient denies any new symptoms of concern overnight and is much more interactive than previous notes detail today.   VITAL SIGNS: Temp:  [97.9 F (36.6 C)-101.1 F (38.4 C)] 99 F (37.2 C) (06/22 1140) Pulse Rate:  [39-122] 111 (06/22 1300) Resp:  [20-41] 21 (06/22 1300) BP: (115-160)/(58-116) 132/77 (06/22 1300) SpO2:  [92 %-100 %] 98 % (06/22 1300) Weight:  [97.5 kg (215 lb)] 97.5 kg (215 lb) (06/22 1403)  PHYSICAL EXAMINATION: General:  Elderly gentlemen resting comfortably in bed.  Neuro:  A/O X 3, grossly normal CN exam. Weak in his UE's bilaterally but able to resist gravity.  HEENT:  EOMI, Oropharynx appears normal, no LAD Cardiovascular:  RRR no m/r/g appreciated  Lungs:  Diffuse scattered wheezes on expiration noted in the bases>apices bilaterally  Abdomen:  Soft, non-tender to palpation. Normal bowel sounds  Musculoskeletal:  No visible abnormalities, UE's weak. No atrophy noted. Left foot with bandage intact.  Skin: Left foot with clean and dry bandage intact. No visible abnormalities or lesions identified.   Recent Labs  Lab 03/16/18 2032 03/16/18 2039 03/17/18 1148 03/18/18 0254  NA 126* 124* 126* 135  K 4.7 4.4 3.9 3.5  CL 90* 90* 89* 101  CO2 18*  --  24 22  BUN 33* 32* 32* 25*  CREATININE 2.17* 1.90* 1.71* 1.52*  GLUCOSE 304* 312* 292* 103*   Recent  Labs  Lab 03/16/18 2032 03/16/18 2039 03/17/18 0846 03/18/18 0254  HGB 13.1 13.6 11.9* 10.4*  HCT 39.4 40.0 35.5* 31.8*  WBC 25.9*  --  27.7* 19.7*  PLT 274  --  279 249   Ct Angio Head W Or Wo Contrast  Result Date: 03/16/2018 CLINICAL DATA:  77 year old male code stroke, left side symptoms. EXAM: CT ANGIOGRAPHY HEAD AND NECK TECHNIQUE: Multidetector CT imaging of the head and neck was performed using the standard protocol during bolus administration of intravenous contrast. Multiplanar CT image reconstructions and MIPs were obtained to evaluate the vascular anatomy. Carotid stenosis measurements (when applicable) are obtained utilizing NASCET criteria, using the distal internal carotid diameter as the denominator. CONTRAST:  35mL ISOVUE-370 IOPAMIDOL (ISOVUE-370) INJECTION 76% COMPARISON:  Head CT without contrast 2041 hours today. FINDINGS: CTA NECK Skeleton: Advanced cervical spine degeneration, especially left side facet arthropathy. Maxillary sinus mucoperiosteal thickening redemonstrated, greater on the left. Dental inflammatory periapical lucency occasionally noted. Chronic distal right clavicle fracture. No acute osseous abnormality identified. Upper chest: Centrilobular emphysema. No superior mediastinal lymphadenopathy. Other neck: Negative.  No neck mass or lymphadenopathy. Aortic arch: Calcified aortic atherosclerosis. 3 vessel arch configuration. Right carotid system: No brachiocephalic artery or right CCA origin stenosis despite atherosclerosis. Intermittent calcified plaque in the right  CCA proximal to the bifurcation without stenosis. Mostly calcified plaque at the right carotid bifurcation and right ICA origin. Less than 50 % stenosis with respect to the distal vessel. Left carotid system: No left CCA origin stenosis despite atherosclerosis. Intermittent calcified plaque in the left CCA without stenosis. Mostly calcified plaque at the left carotid bifurcation involving the left ICA  origin and bulb. Maximal stenosis is at the distal bulb level estimated at 50 % with respect to the distal vessel (series 8, image 122). Vertebral arteries: No significant proximal right subclavian artery stenosis despite bulky calcified plaque. No right vertebral artery origin stenosis. Negative right vertebral artery to the skull base. No proximal left subclavian artery stenosis despite atherosclerosis. Calcified plaque at the left vertebral artery origin resulting in moderate to severe stenosis as seen on series 9, image 129. Intermittent left V1 calcified plaque. But no other occasional left V2 and V3 calcified plaque significant stenosis to the skull base. CTA HEAD Posterior circulation: Patent distal vertebral arteries without stenosis. The left is mildly dominant. Mild left V4 calcified plaque. Patent right PICA origin. Dominant left AICA is patent. Patent vertebrobasilar junction. Patent basilar artery is diminutive, but without stenosis. SCA and PCA origins are patent. Posterior communicating arteries are diminutive or absent. No PCA stenosis or branch occlusion identified. Anterior circulation: Both ICA siphons are patent. Mild to moderate left siphon calcified plaque with only mild left cavernous segment stenosis. Mild to moderate right siphon calcified plaque. Mild right siphon supraclinoid segment stenosis. Patent carotid termini. Normal MCA and ACA origins. Anterior communicating artery is normal. The left ACA appears somewhat dominant throughout. Left MCA M1 segment, bifurcation, and left MCA branches are within normal limits. Right MCA M1 segment is patent with mild irregularity but no high-grade stenosis. Right MCA bifurcation and right MCA branches are within normal limits. Venous sinuses: Not evaluated due to early contrast phase. Anatomic variants: Mildly dominant left vertebral artery, left ACA. Review of the MIP images confirms the above findings IMPRESSION: 1. Negative for large vessel  occlusion. This was preliminarily discussed by telephone with Dr. Kerney Elbe on 03/16/2018 at 2114 hours. 2. Positive for atherosclerosis in the head and neck, and Aortic Atherosclerosis (ICD10-I70.0). 3. 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 No hemodynamically significant carotid or right vertebral stenosis. 4.  Emphysema (ICD10-J43.9). Electronically Signed   By: Genevie Ann M.D.   On: 03/16/2018 21:25   Ct Head Wo Contrast  Result Date: 03/17/2018 CLINICAL DATA:  Septic, increasing confusion. 24 hours after tPA. History of hypertension, diabetes. EXAM: CT HEAD WITHOUT CONTRAST TECHNIQUE: Contiguous axial images were obtained from the base of the skull through the vertex without intravenous contrast. COMPARISON:  CT HEAD March 16, 2018 FINDINGS: BRAIN: No intraparenchymal hemorrhage, mass effect nor midline shift. The ventricles and sulci are normal for age. Patchy supratentorial white matter hypodensities within normal range for patient's age, though non-specific are most compatible with chronic small vessel ischemic disease. No acute large vascular territory infarcts. No abnormal extra-axial fluid collections. Basal cisterns are patent. VASCULAR: Mild-to-moderate calcific atherosclerosis of the carotid siphons. SKULL: No skull fracture. No significant scalp soft tissue swelling. SINUSES/ORBITS: Chronic bony remodeling LEFT maxillary sinus compatible with chronic sinusitis, no acute component. Mastoid air cells are well aerated.The included ocular globes and orbital contents are non-suspicious. OTHER: None. IMPRESSION: Negative non-contrast CT HEAD for age. Electronically Signed   By: Elon Alas M.D.   On: 03/17/2018 17:51   Ct  Angio Neck W Or Wo Contrast  Result Date: 03/16/2018 CLINICAL DATA:  77 year old male code stroke, left side symptoms. EXAM: CT ANGIOGRAPHY HEAD AND NECK TECHNIQUE: Multidetector CT imaging of the head and neck was performed  using the standard protocol during bolus administration of intravenous contrast. Multiplanar CT image reconstructions and MIPs were obtained to evaluate the vascular anatomy. Carotid stenosis measurements (when applicable) are obtained utilizing NASCET criteria, using the distal internal carotid diameter as the denominator. CONTRAST:  23mL ISOVUE-370 IOPAMIDOL (ISOVUE-370) INJECTION 76% COMPARISON:  Head CT without contrast 2041 hours today. FINDINGS: CTA NECK Skeleton: Advanced cervical spine degeneration, especially left side facet arthropathy. Maxillary sinus mucoperiosteal thickening redemonstrated, greater on the left. Dental inflammatory periapical lucency occasionally noted. Chronic distal right clavicle fracture. No acute osseous abnormality identified. Upper chest: Centrilobular emphysema. No superior mediastinal lymphadenopathy. Other neck: Negative.  No neck mass or lymphadenopathy. Aortic arch: Calcified aortic atherosclerosis. 3 vessel arch configuration. Right carotid system: No brachiocephalic artery or right CCA origin stenosis despite atherosclerosis. Intermittent calcified plaque in the right CCA proximal to the bifurcation without stenosis. Mostly calcified plaque at the right carotid bifurcation and right ICA origin. Less than 50 % stenosis with respect to the distal vessel. Left carotid system: No left CCA origin stenosis despite atherosclerosis. Intermittent calcified plaque in the left CCA without stenosis. Mostly calcified plaque at the left carotid bifurcation involving the left ICA origin and bulb. Maximal stenosis is at the distal bulb level estimated at 50 % with respect to the distal vessel (series 8, image 122). Vertebral arteries: No significant proximal right subclavian artery stenosis despite bulky calcified plaque. No right vertebral artery origin stenosis. Negative right vertebral artery to the skull base. No proximal left subclavian artery stenosis despite atherosclerosis.  Calcified plaque at the left vertebral artery origin resulting in moderate to severe stenosis as seen on series 9, image 129. Intermittent left V1 calcified plaque. But no other occasional left V2 and V3 calcified plaque significant stenosis to the skull base. CTA HEAD Posterior circulation: Patent distal vertebral arteries without stenosis. The left is mildly dominant. Mild left V4 calcified plaque. Patent right PICA origin. Dominant left AICA is patent. Patent vertebrobasilar junction. Patent basilar artery is diminutive, but without stenosis. SCA and PCA origins are patent. Posterior communicating arteries are diminutive or absent. No PCA stenosis or branch occlusion identified. Anterior circulation: Both ICA siphons are patent. Mild to moderate left siphon calcified plaque with only mild left cavernous segment stenosis. Mild to moderate right siphon calcified plaque. Mild right siphon supraclinoid segment stenosis. Patent carotid termini. Normal MCA and ACA origins. Anterior communicating artery is normal. The left ACA appears somewhat dominant throughout. Left MCA M1 segment, bifurcation, and left MCA branches are within normal limits. Right MCA M1 segment is patent with mild irregularity but no high-grade stenosis. Right MCA bifurcation and right MCA branches are within normal limits. Venous sinuses: Not evaluated due to early contrast phase. Anatomic variants: Mildly dominant left vertebral artery, left ACA. Review of the MIP images confirms the above findings IMPRESSION: 1. Negative for large vessel occlusion. This was preliminarily discussed by telephone with Dr. Kerney Elbe on 03/16/2018 at 2114 hours. 2. Positive for atherosclerosis in the head and neck, and Aortic Atherosclerosis (ICD10-I70.0). 3. 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 No hemodynamically significant carotid or right vertebral stenosis. 4.  Emphysema (ICD10-J43.9). Electronically Signed    By: Genevie Ann M.D.   On: 03/16/2018 21:25  Dg Chest Port 1 View  Result Date: 03/17/2018 CLINICAL DATA:  Respiratory distress EXAM: PORTABLE CHEST 1 VIEW COMPARISON:  10/20/2014 FINDINGS: Cardiac shadows within normal limits. Aortic calcifications are again seen. The lungs are well aerated bilaterally. No focal infiltrate or sizable effusion is noted. No acute bony abnormality is seen. IMPRESSION: No active disease. Electronically Signed   By: Inez Catalina M.D.   On: 03/17/2018 12:35   Dg Foot 2 Views Left  Result Date: 03/17/2018 CLINICAL DATA:  Cellulitis of foot EXAM: LEFT FOOT - 2 VIEW COMPARISON:  None. FINDINGS: Changes consistent with prior fifth metatarsal fracture with healing are noted. Degenerative changes are noted at the first metatarsal tarsal articulation. Diffuse tarsal degenerative changes are seen. No acute fracture or dislocation is noted. No soft tissue abnormality is seen. IMPRESSION: Chronic changes without acute abnormality. Electronically Signed   By: Inez Catalina M.D.   On: 03/17/2018 12:36   Ct Head Code Stroke Wo Contrast  Result Date: 03/16/2018 CLINICAL DATA:  Code stroke. 77 year old male with left side weakness and facial droop last seen normal 1840 hours. EXAM: CT HEAD WITHOUT CONTRAST TECHNIQUE: Contiguous axial images were obtained from the base of the skull through the vertex without intravenous contrast. COMPARISON:  None. FINDINGS: Brain: No midline shift, ventriculomegaly, mass effect, evidence of mass lesion, intracranial hemorrhage or evidence of cortically based acute infarction. No cortical encephalomalacia identified. Bilateral gray-white matter differentiation is within normal limits for age. Vascular: Calcified atherosclerosis at the skull base. No suspicious intracranial vascular hyperdensity. Skull: Negative. Sinuses/Orbits: Maxillary mucoperiosteal thickening greater on the left. Other paranasal sinuses are well pneumatized. Tympanic cavities and mastoids  are clear. Other: Visualized orbit soft tissues are within normal limits. Visualized scalp soft tissues are within normal limits. ASPECTS [facility] Stroke Program Early CT Score) - Ganglionic level infarction (caudate, lentiform nuclei, internal capsule, insula, M1-M3 cortex): 7 - Supraganglionic infarction (M4-M6 cortex): 3 Total score (0-10 with 10 being normal): 10 IMPRESSION: 1. Negative for age. No acute cortically based infarct or intracranial hemorrhage is identified. 2. ASPECTS is 10. 3. Chronic maxillary sinusitis. 4. These results were communicated to Dr. Cheral Marker at 8:50 pmon 6/20/2019by text page via the [facility] messaging system. Electronically Signed   By: Genevie Ann M.D.   On: 03/16/2018 20:51    ASSESSMENT / PLAN:  Sufian Ravi is a 77 y.o. male with recent stroke treated with tPA and now showing neurological improvement. Infected left diabetic wound that was debrided by ortho, and remains on broad spectrum antibiotics per ID recommendations. VSS and on 2L of [place] O2 with no respiratory distress but diffuse scattered expiratory wheezing on exam noted.   -Recommend supplemental O2 to >92% only via [place] -Duo-nebs TID for the next day then PRN after to facilitate respiratory function  -Recommend incentive spirometer at bedside and education on it's use with continual encouragement until discharge -PT/OT for conditioning and discharge planning  Thank you for the interesting consult and please don't hesitate to contact the Pulmonary service if any additional questions or concerns arise.    Caffie Damme, MD  Pulmonary and [place] Pager: [phone]  03/18/2018, 2:06 PM

## 2018-03-18 NOTE — Progress Notes (Signed)
Neurology Progress Note      Interval Hx:  Patient had surgical debridement of his left foot infected wound yesterday evening by Dr. Stann Mainland from orthopedic surgery. He looks a lot better clinically today and is not short of breath with clear mentation. He has been started on antibiotics and appreciate help from infectious disease, pulmonary critical care and cardiology and orthopedics. Repeat CT scan of the head yesterday shows no acute infarct. Transthoracic echo was also unremarkable. He is doing well neurologically with practically no weakness now on the left side and his left hand is also improving  Home Medications:    ROS: As per HPI. Pt is confused and cannot complete at this time  Physical Examination: Blood pressure 131/74, pulse (!) 122, temperature 97.9 F (36.6 C), temperature source Axillary, resp. rate (!) 28, height '6\' 1"'  (1.854 m), weight 215 lb 9.8 oz (97.8 kg), SpO2 98 %.  HEENT: [place]/AT Lungs: Respirations unlabored Ext: LLE has  Bandage from surgical debridement from 03/17/18 Neurologic Examination: Mental Status: Alert, can answer his name, but is confused and gives incorrect answers to rest of questions. He cannot complete a sentence without becoming sob. Speech is dysarthric. He poorly attends and has difficulty following commands.  Cranial Nerves: II:  Visual fields intact without extinction to DSS. PERRL.  III,IV, VI: EOMI, PERRL. No ptosis.  V,VII: Subtle left facial droop. Facial temp sensation equal bilaterally VIII: hearing intact to voice IX,X: Palate rises symmetrically XI: Head at midline XII: midline tongue extension  Motor: RUE: 5/5 LUE: 4+/5 no.drift present. minimal weakness of left grip and intrinsic hand muscles  RLE 5/5 LLE: 4+/5 Sensory: Decreased sensation in left foot. Positive extinction to FT RUE and RLE.  Deep Tendon Reflexes:  1+ bilateral brachioradialis and biceps 1+ patellae bilaterally 0 achilles bilaterally Plantars: Right: [place]     Left: tonically upgoing Cerebellar: Prominent ataxia with FNF on left. No definite ataxia with H-S bilaterally. Prominent action tremor bilateral upper ext.  Gait: Deferred due to acuity of presentation   Results for orders placed or performed during the hospital encounter of 03/16/18 (from the past 48 hour(s))  Glucose, capillary     Status: Abnormal   Collection Time: 03/16/18  8:31 PM  Result Value Ref Range   Glucose-Capillary 311 (H) 65 - 99 mg/dL  Ethanol     Status: None   Collection Time: 03/16/18  8:32 PM  Result Value Ref Range   Alcohol, Ethyl (B) <10 <10 mg/dL    Comment: (NOTE) Lowest detectable limit for serum alcohol is 10 mg/dL. For medical purposes only. Performed at [facility], [place] [address] [place], [place] [address]   Protime-INR     Status: Abnormal   Collection Time: 03/16/18  8:32 PM  Result Value Ref Range   Prothrombin Time 15.4 (H) 11.4 - 15.2 seconds   INR 1.23     Comment: Performed at [facility] [address], [place],  [address]  APTT     Status: None   Collection Time: 03/16/18  8:32 PM  Result Value Ref Range   aPTT 32 24 - 36 seconds    Comment: Performed at [facility] [address] [place] [address]  CBC     Status: Abnormal   Collection Time: 03/16/18  8:32 PM  Result Value Ref Range   WBC 25.9 (H) 4.0 - 10.5 K/uL   RBC 4.48 4.22 - 5.81 MIL/uL   Hemoglobin 13.1 13.0 -  17.0 g/dL   HCT 39.4 39.0 - 52.0 %   MCV 87.9 78.0 - 100.0 fL   MCH 29.2 26.0 - 34.0 pg   MCHC 33.2 30.0 - 36.0 g/dL   RDW 12.3 11.5 - 15.5 %   Platelets 274 150 - 400 K/uL    Comment: Performed at [facility], [place] [address] [place], [place] [address]  Differential     Status: Abnormal   Collection Time: 03/16/18  8:32 PM  Result Value Ref Range   Neutrophils Relative % 92 %   Lymphocytes Relative 3 %   Monocytes Relative 5 %   Eosinophils Relative 0 %   Basophils Relative 0 %   Neutro Abs 23.8 (H) 1.7 - 7.7  K/uL   Lymphs Abs 0.8 0.7 - 4.0 K/uL   Monocytes Absolute 1.3 (H) 0.1 - 1.0 K/uL   Eosinophils Absolute 0.0 0.0 - 0.7 K/uL   Basophils Absolute 0.0 0.0 - 0.1 K/uL   WBC Morphology MILD LEFT SHIFT (1-5% METAS, OCC MYELO, OCC BANDS)     Comment: Performed at [facility] [address], [place], [place] [address]  Comprehensive metabolic panel     Status: Abnormal   Collection Time: 03/16/18  8:32 PM  Result Value Ref Range   Sodium 126 (L) 135 - 145 mmol/L   Potassium 4.7 3.5 - 5.1 mmol/L   Chloride 90 (L) 101 - 111 mmol/L   CO2 18 (L) 22 - 32 mmol/L   Glucose, Bld 304 (H) 65 - 99 mg/dL   BUN 33 (H) 6 - 20 mg/dL   Creatinine, Ser 2.17 (H) 0.61 - 1.24 mg/dL   Calcium 8.3 (L) 8.9 - 10.3 mg/dL   Total Protein 6.8 6.5 - 8.1 g/dL   Albumin 2.7 (L) 3.5 - 5.0 g/dL   AST 62 (H) 15 - 41 U/L   ALT 46 17 - 63 U/L   Alkaline Phosphatase 117 38 - 126 U/L   Total Bilirubin 1.1 0.3 - 1.2 mg/dL   GFR calc non Af Amer 28 (L) >60 mL/min   GFR calc Af Amer 33 (L) >60 mL/min    Comment: (NOTE) The eGFR has been calculated using the CKD EPI equation. This calculation has not been validated in all clinical situations. eGFR's persistently <60 mL/min signify possible Chronic Kidney Disease.    Anion gap 18 (H) 5 - 15    Comment: Performed at [facility], [place] [address], [place], [place] [address]  I-Stat Chem 8, ED     Status: Abnormal   Collection Time: 03/16/18  8:39 PM  Result Value Ref Range   Sodium 124 (L) 135 - 145 mmol/L   Potassium 4.4 3.5 - 5.1 mmol/L   Chloride 90 (L) 101 - 111 mmol/L   BUN 32 (H) 6 - 20 mg/dL   Creatinine, Ser 1.90 (H) 0.61 - 1.24 mg/dL   Glucose, Bld 312 (H) 65 - 99 mg/dL   Calcium, Ion 0.95 (L) 1.15 - 1.40 mmol/L   TCO2 18 (L) 22 - 32 mmol/L   Hemoglobin 13.6 13.0 - 17.0 g/dL   HCT 40.0 39.0 - 52.0 %  I-stat troponin, ED     Status: None   Collection Time: 03/16/18  8:42 PM  Result Value Ref Range   Troponin i, poc 0.08 0.00 - 0.08 ng/mL    Comment 3            Comment: Due to the release kinetics of cTnI,  a negative result within the first hours of the onset of symptoms does not rule out myocardial infarction with certainty. If myocardial infarction is still suspected, repeat the test at appropriate intervals.   Urine rapid drug screen (hosp performed)     Status: Abnormal   Collection Time: 03/16/18  9:25 PM  Result Value Ref Range   Opiates NONE DETECTED NONE DETECTED   Cocaine NONE DETECTED NONE DETECTED   Benzodiazepines NONE DETECTED NONE DETECTED   Amphetamines NONE DETECTED NONE DETECTED   Tetrahydrocannabinol NONE DETECTED NONE DETECTED   Barbiturates (A) NONE DETECTED    Result not available. Reagent lot number recalled by manufacturer.    Comment: Performed at [facility], [place] [address], [place], [place] [address]  Urinalysis, Routine w reflex microscopic     Status: Abnormal   Collection Time: 03/16/18  9:25 PM  Result Value Ref Range   Color, Urine YELLOW YELLOW   APPearance HAZY (A) CLEAR   Specific Gravity, Urine 1.011 1.005 - 1.030   pH 6.0 5.0 - 8.0   Glucose, UA >=500 (A) NEGATIVE mg/dL   Hgb urine dipstick LARGE (A) NEGATIVE   Bilirubin Urine NEGATIVE NEGATIVE   Ketones, ur 5 (A) NEGATIVE mg/dL   Protein, ur 30 (A) NEGATIVE mg/dL   Nitrite NEGATIVE NEGATIVE   Leukocytes, UA LARGE (A) NEGATIVE   RBC / HPF 6-10 0 - 5 RBC/hpf   WBC, UA 11-20 0 - 5 WBC/hpf   Bacteria, UA RARE (A) NONE SEEN   Squamous Epithelial / LPF 0-5 0 - 5    Comment: Performed at [facility], [place] [address], [place], [place] [address]  MRSA PCR Screening     Status: None   Collection Time: 03/16/18 10:37 PM  Result Value Ref Range   MRSA by PCR NEGATIVE NEGATIVE    Comment:        The GeneXpert MRSA Assay (FDA approved for NASAL specimens only), is one component of a comprehensive MRSA colonization surveillance program. It is not intended to diagnose MRSA infection nor to guide or monitor treatment  for MRSA infections. Performed at [facility], [place] [address] [place], [place] [address]   CK total and CKMB (cardiac)not at [facility]     Status: Abnormal   Collection Time: 03/16/18 11:36 PM  Result Value Ref Range   Total CK 2,084 (H) 49 - 397 U/L   CK, MB 13.1 (H) 0.5 - 5.0 ng/mL   Relative Index 0.6 0.0 - 2.5    Comment: Performed at [facility] [address], [place], [place] [address]  Troponin I (q 6hr x 3)     Status: Abnormal   Collection Time: 03/16/18 11:36 PM  Result Value Ref Range   Troponin I 0.22 (HH) <0.03 ng/mL    Comment: CRITICAL RESULT CALLED TO, READ BACK BY AND VERIFIED WITH: BARRIER D,RN 03/17/18 0059 WAYK Performed at [facility], [place] [address], [place], [place] [address]   Hemoglobin A1c     Status: Abnormal   Collection Time: 03/17/18  3:39 AM  Result Value Ref Range   Hgb A1c MFr Bld 7.6 (H) 4.8 - 5.6 %    Comment: (NOTE) Pre diabetes:          5.7%-6.4% Diabetes:              >6.4% Glycemic control for   <7.0% adults with diabetes    Mean Plasma Glucose 171.42 mg/dL  Comment: Performed at [facility], [place] [address], [place], [place] [address]  Lipid panel     Status: Abnormal   Collection Time: 03/17/18  3:39 AM  Result Value Ref Range   Cholesterol 75 0 - 200 mg/dL   Triglycerides 114 <150 mg/dL   HDL 13 (L) >40 mg/dL   Total CHOL/HDL Ratio 5.8 RATIO   VLDL 23 0 - 40 mg/dL   LDL Cholesterol 39 0 - 99 mg/dL    Comment:        Total Cholesterol/HDL:CHD Risk Coronary Heart Disease Risk Table                     Men   Women  1/2 Average Risk   3.4   3.3  Average Risk       5.0   4.4  2 X Average Risk   9.6   7.1  3 X Average Risk  23.4   11.0        Use the calculated Patient Ratio above and the CHD Risk Table to determine the patient's CHD Risk.        ATP III CLASSIFICATION (LDL):  <100     mg/dL   Optimal  100-129  mg/dL   Near or Above                    Optimal  130-159  mg/dL   Borderline  160-189   mg/dL   High  >190     mg/dL   Very High Performed at [facility] [address], [place], [place] [address]   Glucose, capillary     Status: Abnormal   Collection Time: 03/17/18  7:27 AM  Result Value Ref Range   Glucose-Capillary 205 (H) 65 - 99 mg/dL  CBC with Differential/Platelet     Status: Abnormal   Collection Time: 03/17/18  8:46 AM  Result Value Ref Range   WBC 27.7 (H) 4.0 - 10.5 K/uL   RBC 4.07 (L) 4.22 - 5.81 MIL/uL   Hemoglobin 11.9 (L) 13.0 - 17.0 g/dL   HCT 35.5 (L) 39.0 - 52.0 %   MCV 87.2 78.0 - 100.0 fL   MCH 29.2 26.0 - 34.0 pg   MCHC 33.5 30.0 - 36.0 g/dL   RDW 12.4 11.5 - 15.5 %   Platelets 279 150 - 400 K/uL   Neutrophils Relative % 90 %   Neutro Abs 25.0 (H) 1.7 - 7.7 K/uL   Lymphocytes Relative 4 %   Lymphs Abs 1.0 0.7 - 4.0 K/uL   Monocytes Relative 5 %   Monocytes Absolute 1.4 (H) 0.1 - 1.0 K/uL   Eosinophils Relative 0 %   Eosinophils Absolute 0.0 0.0 - 0.7 K/uL   Basophils Relative 0 %   Basophils Absolute 0.1 0.0 - 0.1 K/uL   Immature Granulocytes 1 %   Abs Immature Granulocytes 0.2 (H) 0.0 - 0.1 K/uL    Comment: Performed at [facility], [address] [place], [place] [address]  Glucose, capillary     Status: Abnormal   Collection Time: 03/17/18 11:45 AM  Result Value Ref Range   Glucose-Capillary 276 (H) 65 - 99 mg/dL  Lactic acid, plasma     Status: Abnormal   Collection Time: 03/17/18 11:48 AM  Result Value Ref Range   Lactic Acid, Venous 2.3 (HH) 0.5 - 1.9 mmol/L    Comment: CRITICAL RESULT CALLED TO, READ BACK BY AND VERIFIED  WITHVic Ripper RN @ [id number] 03/17/18 LEONARD,A Performed at [facility], [place] [address], [place], [place] [address]   Procalcitonin - Baseline     Status: None   Collection Time: 03/17/18 11:48 AM  Result Value Ref Range   Procalcitonin 29.19 ng/mL    Comment:        Interpretation: PCT >= 10 ng/mL: Important systemic inflammatory response, almost exclusively due to severe  bacterial sepsis or septic shock. (NOTE)       Sepsis PCT Algorithm           Lower Respiratory Tract                                      Infection PCT Algorithm    ----------------------------     ----------------------------         PCT < 0.25 ng/mL                PCT < 0.10 ng/mL         Strongly encourage             Strongly discourage   discontinuation of antibiotics    initiation of antibiotics    ----------------------------     -----------------------------       PCT 0.25 - 0.50 ng/mL            PCT 0.10 - 0.25 ng/mL               OR       >80% decrease in PCT            Discourage initiation of                                            antibiotics      Encourage discontinuation           of antibiotics    ----------------------------     -----------------------------         PCT >= 0.50 ng/mL              PCT 0.26 - 0.50 ng/mL                AND       <80% decrease in PCT             Encourage initiation of                                             antibiotics       Encourage continuation           of antibiotics    ----------------------------     -----------------------------        PCT >= 0.50 ng/mL                  PCT > 0.50 ng/mL               AND         increase in PCT                  Strongly encourage  initiation of antibiotics    Strongly encourage escalation           of antibiotics                                     -----------------------------                                           PCT <= 0.25 ng/mL                                                 OR                                        > 80% decrease in PCT                                     Discontinue / Do not initiate                                             antibiotics Performed at [facility], [address], [place], [place] [address]   Troponin I (q 6hr x 3)     Status: Abnormal   Collection Time: 03/17/18 11:48 AM  Result Value Ref  Range   Troponin I 0.15 (HH) <0.03 ng/mL    Comment: CRITICAL VALUE NOTED.  VALUE IS CONSISTENT WITH PREVIOUSLY REPORTED AND CALLED VALUE. Performed at [facility], [place] [address], [place], [place] [address]   Renal function panel     Status: Abnormal   Collection Time: 03/17/18 11:48 AM  Result Value Ref Range   Sodium 126 (L) 135 - 145 mmol/L   Potassium 3.9 3.5 - 5.1 mmol/L   Chloride 89 (L) 101 - 111 mmol/L   CO2 24 22 - 32 mmol/L   Glucose, Bld 292 (H) 65 - 99 mg/dL   BUN 32 (H) 6 - 20 mg/dL   Creatinine, Ser 1.71 (H) 0.61 - 1.24 mg/dL   Calcium 8.2 (L) 8.9 - 10.3 mg/dL   Phosphorus 3.2 2.5 - 4.6 mg/dL   Albumin 2.6 (L) 3.5 - 5.0 g/dL   GFR calc non Af Amer 38 (L) >60 mL/min   GFR calc Af Amer 44 (L) >60 mL/min    Comment: (NOTE) The eGFR has been calculated using the CKD EPI equation. This calculation has not been validated in all clinical situations. eGFR's persistently <60 mL/min signify possible Chronic Kidney Disease.    Anion gap 13 5 - 15    Comment: Performed at [facility] [address], [place], [place] [address]  Magnesium     Status: None   Collection Time: 03/17/18 11:48 AM  Result Value Ref Range   Magnesium 2.0 1.7 - 2.4 mg/dL    Comment: Performed at [facility] [address] [place], [place] [address]  Brain natriuretic peptide     Status: Abnormal   Collection Time: 03/17/18 11:48 AM  Result Value Ref Range   B Natriuretic Peptide 358.5 (H) 0.0 - 100.0 pg/mL    Comment: Performed at [facility] [address], [place], [place] [address]  Aerobic Culture (superficial specimen)     Status: None (Preliminary result)   Collection Time: 03/17/18 11:51 AM  Result Value Ref Range   Specimen Description WOUND LEFT FOOT    Special Requests PENDING    Gram Stain      FEW WBC PRESENT,BOTH PMN AND MONONUCLEAR ABUNDANT GRAM POSITIVE COCCI ABUNDANT GRAM NEGATIVE RODS    Culture      CULTURE REINCUBATED FOR BETTER  GROWTH Performed at [facility], [place] [address] [place], [place] [address]    Report Status PENDING   Lactic acid, plasma     Status: None   Collection Time: 03/17/18  2:35 PM  Result Value Ref Range   Lactic Acid, Venous 1.2 0.5 - 1.9 mmol/L    Comment: Performed at [facility], [place] [address] [place], [place] [address]  Anaerobic culture     Status: None (Preliminary result)   Collection Time: 03/17/18  4:15 PM  Result Value Ref Range   Specimen Description WOUND LEFT FOOT    Special Requests      NONE Performed at [facility], [place] [address], [place], [place] [address]    Gram Stain PENDING    Culture      NO ANAEROBES ISOLATED; CULTURE IN PROGRESS FOR 5 DAYS   Report Status PENDING   Glucose, capillary     Status: Abnormal   Collection Time: 03/17/18  4:35 PM  Result Value Ref Range   Glucose-Capillary 131 (H) 65 - 99 mg/dL  Troponin I (q 6hr x 3)     Status: Abnormal   Collection Time: 03/17/18  4:50 PM  Result Value Ref Range   Troponin I 0.14 (HH) <0.03 ng/mL    Comment: CRITICAL VALUE NOTED.  VALUE IS CONSISTENT WITH PREVIOUSLY REPORTED AND CALLED VALUE. Performed at [facility], [place] [address], [place], [place] [address]   Aerobic/Anaerobic Culture (surgical/deep wound)     Status: None (Preliminary result)   Collection Time: 03/17/18  8:28 PM  Result Value Ref Range   Specimen Description ABSCESS LEFT FOOT    Special Requests      PATIENT ON FOLLOWING ZOSYN VANC Performed at [facility], [place] [address], [place], [place] [address]    Gram Stain      MODERATE WBC PRESENT, PREDOMINANTLY PMN MODERATE GRAM POSITIVE COCCI FEW GRAM NEGATIVE RODS    Culture      CULTURE REINCUBATED FOR BETTER GROWTH NO ANAEROBES ISOLATED; CULTURE IN PROGRESS FOR 5 DAYS    Report Status PENDING   Glucose, capillary     Status: None   Collection Time: 03/17/18  8:51 PM  Result Value Ref Range   Glucose-Capillary 88 65 - 99 mg/dL  Troponin I (q  6hr x 3)     Status: Abnormal   Collection Time: 03/17/18 11:15 PM  Result Value Ref Range   Troponin I 0.11 (HH) <0.03 ng/mL    Comment: CRITICAL VALUE NOTED.  VALUE IS CONSISTENT WITH PREVIOUSLY REPORTED AND CALLED VALUE. Performed at [facility], [place] [address] [place], [place] [address]   Glucose, capillary     Status: Abnormal   Collection Time: 03/17/18  11:41 PM  Result Value Ref Range   Glucose-Capillary 126 (H) 65 - 99 mg/dL  Procalcitonin     Status: None   Collection Time: 03/18/18  2:54 AM  Result Value Ref Range   Procalcitonin 17.79 ng/mL    Comment:        Interpretation: PCT >= 10 ng/mL: Important systemic inflammatory response, almost exclusively due to severe bacterial sepsis or septic shock. (NOTE)       Sepsis PCT Algorithm           Lower Respiratory Tract                                      Infection PCT Algorithm    ----------------------------     ----------------------------         PCT < 0.25 ng/mL                PCT < 0.10 ng/mL         Strongly encourage             Strongly discourage   discontinuation of antibiotics    initiation of antibiotics    ----------------------------     -----------------------------       PCT 0.25 - 0.50 ng/mL            PCT 0.10 - 0.25 ng/mL               OR       >80% decrease in PCT            Discourage initiation of                                            antibiotics      Encourage discontinuation           of antibiotics    ----------------------------     -----------------------------         PCT >= 0.50 ng/mL              PCT 0.26 - 0.50 ng/mL                AND       <80% decrease in PCT             Encourage initiation of                                             antibiotics       Encourage continuation           of antibiotics    ----------------------------     -----------------------------        PCT >= 0.50 ng/mL                  PCT > 0.50 ng/mL               AND         increase in PCT                   Strongly encourage  initiation of antibiotics    Strongly encourage escalation           of antibiotics                                     -----------------------------                                           PCT <= 0.25 ng/mL                                                 OR                                        > 80% decrease in PCT                                     Discontinue / Do not initiate                                             antibiotics Performed at [facility], [address] [place], [place] [address]   CBC     Status: Abnormal   Collection Time: 03/18/18  2:54 AM  Result Value Ref Range   WBC 19.7 (H) 4.0 - 10.5 K/uL   RBC 3.55 (L) 4.22 - 5.81 MIL/uL   Hemoglobin 10.4 (L) 13.0 - 17.0 g/dL   HCT 31.8 (L) 39.0 - 52.0 %   MCV 89.6 78.0 - 100.0 fL   MCH 29.3 26.0 - 34.0 pg   MCHC 32.7 30.0 - 36.0 g/dL   RDW 12.5 11.5 - 15.5 %   Platelets 249 150 - 400 K/uL    Comment: Performed at [facility], [place] [address] [place], [place] [address]  Renal function panel     Status: Abnormal   Collection Time: 03/18/18  2:54 AM  Result Value Ref Range   Sodium 135 135 - 145 mmol/L   Potassium 3.5 3.5 - 5.1 mmol/L   Chloride 101 101 - 111 mmol/L   CO2 22 22 - 32 mmol/L   Glucose, Bld 103 (H) 65 - 99 mg/dL   BUN 25 (H) 6 - 20 mg/dL   Creatinine, Ser 1.52 (H) 0.61 - 1.24 mg/dL   Calcium 7.2 (L) 8.9 - 10.3 mg/dL   Phosphorus 3.2 2.5 - 4.6 mg/dL   Albumin 2.0 (L) 3.5 - 5.0 g/dL   GFR calc non Af Amer 43 (L) >60 mL/min   GFR calc Af Amer 50 (L) >60 mL/min    Comment: (NOTE) The eGFR has been calculated using the CKD EPI equation. This calculation has not been validated in all clinical situations. eGFR's persistently <60 mL/min signify possible Chronic Kidney Disease.    Anion gap 12 5 - 15    Comment: Performed at [facility] [place],  [place] [address]  Magnesium     Status:  None   Collection Time: 03/18/18  2:54 AM  Result Value Ref Range   Magnesium 2.0 1.7 - 2.4 mg/dL    Comment: Performed at [facility], [place] [address], [place], [place] [address]  Glucose, capillary     Status: Abnormal   Collection Time: 03/18/18  4:37 AM  Result Value Ref Range   Glucose-Capillary 106 (H) 65 - 99 mg/dL  Glucose, capillary     Status: Abnormal   Collection Time: 03/18/18  7:54 AM  Result Value Ref Range   Glucose-Capillary 118 (H) 65 - 99 mg/dL   Ct Angio Head W Or Wo Contrast  Result Date: 03/16/2018 CLINICAL DATA:  77 year old male code stroke, left side symptoms. EXAM: CT ANGIOGRAPHY HEAD AND NECK TECHNIQUE: Multidetector CT imaging of the head and neck was performed using the standard protocol during bolus administration of intravenous contrast. Multiplanar CT image reconstructions and MIPs were obtained to evaluate the vascular anatomy. Carotid stenosis measurements (when applicable) are obtained utilizing NASCET criteria, using the distal internal carotid diameter as the denominator. CONTRAST:  73m ISOVUE-370 IOPAMIDOL (ISOVUE-370) INJECTION 76% COMPARISON:  Head CT without contrast 2041 hours today. FINDINGS: CTA NECK Skeleton: Advanced cervical spine degeneration, especially left side facet arthropathy. Maxillary sinus mucoperiosteal thickening redemonstrated, greater on the left. Dental inflammatory periapical lucency occasionally noted. Chronic distal right clavicle fracture. No acute osseous abnormality identified. Upper chest: Centrilobular emphysema. No superior mediastinal lymphadenopathy. Other neck: Negative.  No neck mass or lymphadenopathy. Aortic arch: Calcified aortic atherosclerosis. 3 vessel arch configuration. Right carotid system: No brachiocephalic artery or right CCA origin stenosis despite atherosclerosis. Intermittent calcified plaque in the right CCA proximal to the bifurcation without stenosis. Mostly calcified plaque at the right carotid  bifurcation and right ICA origin. Less than 50 % stenosis with respect to the distal vessel. Left carotid system: No left CCA origin stenosis despite atherosclerosis. Intermittent calcified plaque in the left CCA without stenosis. Mostly calcified plaque at the left carotid bifurcation involving the left ICA origin and bulb. Maximal stenosis is at the distal bulb level estimated at 50 % with respect to the distal vessel (series 8, image 122). Vertebral arteries: No significant proximal right subclavian artery stenosis despite bulky calcified plaque. No right vertebral artery origin stenosis. Negative right vertebral artery to the skull base. No proximal left subclavian artery stenosis despite atherosclerosis. Calcified plaque at the left vertebral artery origin resulting in moderate to severe stenosis as seen on series 9, image 129. Intermittent left V1 calcified plaque. But no other occasional left V2 and V3 calcified plaque significant stenosis to the skull base. CTA HEAD Posterior circulation: Patent distal vertebral arteries without stenosis. The left is mildly dominant. Mild left V4 calcified plaque. Patent right PICA origin. Dominant left AICA is patent. Patent vertebrobasilar junction. Patent basilar artery is diminutive, but without stenosis. SCA and PCA origins are patent. Posterior communicating arteries are diminutive or absent. No PCA stenosis or branch occlusion identified. Anterior circulation: Both ICA siphons are patent. Mild to moderate left siphon calcified plaque with only mild left cavernous segment stenosis. Mild to moderate right siphon calcified plaque. Mild right siphon supraclinoid segment stenosis. Patent carotid termini. Normal MCA and ACA origins. Anterior communicating artery is normal. The left ACA appears somewhat dominant throughout. Left MCA M1 segment, bifurcation, and left MCA branches are within normal limits. Right MCA M1 segment is patent with mild irregularity but no high-grade  stenosis. Right MCA  bifurcation and right MCA branches are within normal limits. Venous sinuses: Not evaluated due to early contrast phase. Anatomic variants: Mildly dominant left vertebral artery, left ACA. Review of the MIP images confirms the above findings IMPRESSION: 1. Negative for large vessel occlusion. This was preliminarily discussed by telephone with Dr. Kerney Elbe on 03/16/2018 at 2114 hours. 2. Positive for atherosclerosis in the head and neck, and Aortic Atherosclerosis (ICD10-I70.0). 3. 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 No hemodynamically significant carotid or right vertebral stenosis. 4.  Emphysema (ICD10-J43.9). Electronically Signed   By: Genevie Ann M.D.   On: 03/16/2018 21:25   Ct Head Wo Contrast  Result Date: 03/17/2018 CLINICAL DATA:  Septic, increasing confusion. 24 hours after tPA. History of hypertension, diabetes. EXAM: CT HEAD WITHOUT CONTRAST TECHNIQUE: Contiguous axial images were obtained from the base of the skull through the vertex without intravenous contrast. COMPARISON:  CT HEAD March 16, 2018 FINDINGS: BRAIN: No intraparenchymal hemorrhage, mass effect nor midline shift. The ventricles and sulci are normal for age. Patchy supratentorial white matter hypodensities within normal range for patient's age, though non-specific are most compatible with chronic small vessel ischemic disease. No acute large vascular territory infarcts. No abnormal extra-axial fluid collections. Basal cisterns are patent. VASCULAR: Mild-to-moderate calcific atherosclerosis of the carotid siphons. SKULL: No skull fracture. No significant scalp soft tissue swelling. SINUSES/ORBITS: Chronic bony remodeling LEFT maxillary sinus compatible with chronic sinusitis, no acute component. Mastoid air cells are well aerated.The included ocular globes and orbital contents are non-suspicious. OTHER: None. IMPRESSION: Negative non-contrast CT HEAD for age.  Electronically Signed   By: Elon Alas M.D.   On: 03/17/2018 17:51   Ct Angio Neck W Or Wo Contrast  Result Date: 03/16/2018 CLINICAL DATA:  77 year old male code stroke, left side symptoms. EXAM: CT ANGIOGRAPHY HEAD AND NECK TECHNIQUE: Multidetector CT imaging of the head and neck was performed using the standard protocol during bolus administration of intravenous contrast. Multiplanar CT image reconstructions and MIPs were obtained to evaluate the vascular anatomy. Carotid stenosis measurements (when applicable) are obtained utilizing NASCET criteria, using the distal internal carotid diameter as the denominator. CONTRAST:  38m ISOVUE-370 IOPAMIDOL (ISOVUE-370) INJECTION 76% COMPARISON:  Head CT without contrast 2041 hours today. FINDINGS: CTA NECK Skeleton: Advanced cervical spine degeneration, especially left side facet arthropathy. Maxillary sinus mucoperiosteal thickening redemonstrated, greater on the left. Dental inflammatory periapical lucency occasionally noted. Chronic distal right clavicle fracture. No acute osseous abnormality identified. Upper chest: Centrilobular emphysema. No superior mediastinal lymphadenopathy. Other neck: Negative.  No neck mass or lymphadenopathy. Aortic arch: Calcified aortic atherosclerosis. 3 vessel arch configuration. Right carotid system: No brachiocephalic artery or right CCA origin stenosis despite atherosclerosis. Intermittent calcified plaque in the right CCA proximal to the bifurcation without stenosis. Mostly calcified plaque at the right carotid bifurcation and right ICA origin. Less than 50 % stenosis with respect to the distal vessel. Left carotid system: No left CCA origin stenosis despite atherosclerosis. Intermittent calcified plaque in the left CCA without stenosis. Mostly calcified plaque at the left carotid bifurcation involving the left ICA origin and bulb. Maximal stenosis is at the distal bulb level estimated at 50 % with respect to the distal  vessel (series 8, image 122). Vertebral arteries: No significant proximal right subclavian artery stenosis despite bulky calcified plaque. No right vertebral artery origin stenosis. Negative right vertebral artery to the skull base. No proximal left subclavian artery stenosis despite atherosclerosis. Calcified plaque at the left  vertebral artery origin resulting in moderate to severe stenosis as seen on series 9, image 129. Intermittent left V1 calcified plaque. But no other occasional left V2 and V3 calcified plaque significant stenosis to the skull base. CTA HEAD Posterior circulation: Patent distal vertebral arteries without stenosis. The left is mildly dominant. Mild left V4 calcified plaque. Patent right PICA origin. Dominant left AICA is patent. Patent vertebrobasilar junction. Patent basilar artery is diminutive, but without stenosis. SCA and PCA origins are patent. Posterior communicating arteries are diminutive or absent. No PCA stenosis or branch occlusion identified. Anterior circulation: Both ICA siphons are patent. Mild to moderate left siphon calcified plaque with only mild left cavernous segment stenosis. Mild to moderate right siphon calcified plaque. Mild right siphon supraclinoid segment stenosis. Patent carotid termini. Normal MCA and ACA origins. Anterior communicating artery is normal. The left ACA appears somewhat dominant throughout. Left MCA M1 segment, bifurcation, and left MCA branches are within normal limits. Right MCA M1 segment is patent with mild irregularity but no high-grade stenosis. Right MCA bifurcation and right MCA branches are within normal limits. Venous sinuses: Not evaluated due to early contrast phase. Anatomic variants: Mildly dominant left vertebral artery, left ACA. Review of the MIP images confirms the above findings IMPRESSION: 1. Negative for large vessel occlusion. This was preliminarily discussed by telephone with Dr. Kerney Elbe on 03/16/2018 at 2114 hours. 2.  Positive for atherosclerosis in the head and neck, and Aortic Atherosclerosis (ICD10-I70.0). 3. 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 No hemodynamically significant carotid or right vertebral stenosis. 4.  Emphysema (ICD10-J43.9). Electronically Signed   By: Genevie Ann M.D.   On: 03/16/2018 21:25   Dg Chest Port 1 View  Result Date: 03/17/2018 CLINICAL DATA:  Respiratory distress EXAM: PORTABLE CHEST 1 VIEW COMPARISON:  10/20/2014 FINDINGS: Cardiac shadows within normal limits. Aortic calcifications are again seen. The lungs are well aerated bilaterally. No focal infiltrate or sizable effusion is noted. No acute bony abnormality is seen. IMPRESSION: No active disease. Electronically Signed   By: Inez Catalina M.D.   On: 03/17/2018 12:35   Dg Foot 2 Views Left  Result Date: 03/17/2018 CLINICAL DATA:  Cellulitis of foot EXAM: LEFT FOOT - 2 VIEW COMPARISON:  None. FINDINGS: Changes consistent with prior fifth metatarsal fracture with healing are noted. Degenerative changes are noted at the first metatarsal tarsal articulation. Diffuse tarsal degenerative changes are seen. No acute fracture or dislocation is noted. No soft tissue abnormality is seen. IMPRESSION: Chronic changes without acute abnormality. Electronically Signed   By: Inez Catalina M.D.   On: 03/17/2018 12:36   Ct Head Code Stroke Wo Contrast  Result Date: 03/16/2018 CLINICAL DATA:  Code stroke. 77 year old male with left side weakness and facial droop last seen normal 1840 hours. EXAM: CT HEAD WITHOUT CONTRAST TECHNIQUE: Contiguous axial images were obtained from the base of the skull through the vertex without intravenous contrast. COMPARISON:  None. FINDINGS: Brain: No midline shift, ventriculomegaly, mass effect, evidence of mass lesion, intracranial hemorrhage or evidence of cortically based acute infarction. No cortical encephalomalacia identified. Bilateral gray-white matter differentiation  is within normal limits for age. Vascular: Calcified atherosclerosis at the skull base. No suspicious intracranial vascular hyperdensity. Skull: Negative. Sinuses/Orbits: Maxillary mucoperiosteal thickening greater on the left. Other paranasal sinuses are well pneumatized. Tympanic cavities and mastoids are clear. Other: Visualized orbit soft tissues are within normal limits. Visualized scalp soft tissues are within normal limits. ASPECTS [facility] Stroke Program  Early CT Score) - Ganglionic level infarction (caudate, lentiform nuclei, internal capsule, insula, M1-M3 cortex): 7 - Supraganglionic infarction (M4-M6 cortex): 3 Total score (0-10 with 10 being normal): 10 IMPRESSION: 1. Negative for age. No acute cortically based infarct or intracranial hemorrhage is identified. 2. ASPECTS is 10. 3. Chronic maxillary sinusitis. 4. These results were communicated to Dr. Cheral Marker at 8:50 pmon 6/20/2019by text page via the [facility] messaging system. Electronically Signed   By: Genevie Ann M.D.   On: 03/16/2018 20:51    Assessment: 77 y.o. male presenting with acute right hemispheric stroke symptoms, treated with IV tPA. He has a tunneling wound in the left foot.and likely sepsis related to that  # Acute Stroke symptoms: Dysphasia has resolved except for minor naming and comprehension deficit. Prominent LUE weakness and subtle LLE weakness still present.   -MRI pending for better etiology but will have to hold given his worsening medical status  -LDL 39, much under goal <70. Continue statin    -CTA head/neck: no LVO. 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 Carotids show no stenosis.  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 SSI during admit, hold po hm meds # History of ulcerative colitis. On Mesalamine.  # Left foot wound- wound care has changed dressing and this is currently oozing a foul odor and has become quite red/swollen overnight. There is tunneling down to bone exposed. Ortho consulted,  possible trip to OR for washout ASAP given this is likely the leading cause of his septic decline seen today. Wound cx ordered. Left foot Xray ordered. # Leukocytosis 25.9 and now with low gr fever- ID and ortho consulted. Sepsis work up and tx started   Plan: Changed MRI brain for later today TTE is unremarkable await cardiology decision wether they will order TEE if suspicious for endocarditis   started ASA  Keep DVT prophylaxis with SCDs as he may go to surgery tonight  and mobilize out of bed. Physical occupational therapy consults. Continue  Vanco and Zosyn, will as ID to adjust abx as needed pending septic wk up Transfer  To neurology floor bed if okay with critical care medicine    I have personally examined this patient, reviewed notes, independently viewed imaging studies, participated in medical decision making and plan of care.ROS completed by me personally and pertinent positives fully documented  I have made any additions or clarifications directly to the above note.   Wife not available at the bedside  .This patient is critically ill and at significant risk of neurological worsening, death and care requires constant monitoring of vital signs, hemodynamics,respiratory and cardiac monitoring, extensive review of multiple databases, frequent neurological assessment, discussion with family, other specialists and medical decision making of high complexity.I have made any additions or clarifications directly to the above note.This critical care time does not reflect procedure time, or teaching time or supervisory time of PA/NP/Med Resident etc but could involve care discussion time.  I spent 30 minutes of neurocritical care time  in the care of  this patient.      Antony Contras, MD Medical Director [facility] Stroke Center Pager: [phone] 03/18/2018 11:17 AM

## 2018-03-18 NOTE — Progress Notes (Signed)
[facility] for Infectious Disease   Reason for visit: Follow up on osteomyelitis  Interval History: had surgical debridement last evening.  AMS now much improved, no further confusion.  Pus noted and exposed bone.  Cultures including bone cultures sent.  Afebrile since yesterday, WBC down to 19.7.    Physical Exam: Constitutional:  Vitals:   03/18/18 1430 03/18/18 1500  BP: 116/77   Pulse: 98 (!) 102  Resp:  (!) 28  Temp:    SpO2: 97% 97%   patient appears in NAD. Alert, pleasant and interactive Eyes: anicteric HENT: no thrush Respiratory: Normal respiratory effort; CTA B Cardiovascular: RRR GI: soft, nt, nd  Review of Systems: Constitutional: negative for fevers and chills Gastrointestinal: negative for nausea and diarrhea Integument/breast: negative for rash Neurological: negative for confusion  Lab Results  Component Value Date   WBC 19.7 (H) 03/18/2018   HGB 10.4 (L) 03/18/2018   HCT 31.8 (L) 03/18/2018   MCV 89.6 03/18/2018   PLT 249 03/18/2018    Lab Results  Component Value Date   CREATININE 1.52 (H) 03/18/2018   BUN 25 (H) 03/18/2018   NA 135 03/18/2018   K 3.5 03/18/2018   CL 101 03/18/2018   CO2 22 03/18/2018    Lab Results  Component Value Date   ALT 46 03/16/2018   AST 62 (H) 03/16/2018   ALKPHOS 117 03/16/2018     Microbiology: Recent Results (from the past 240 hour(s))  MRSA PCR Screening     Status: None   Collection Time: 03/16/18 10:37 PM  Result Value Ref Range Status   MRSA by PCR NEGATIVE NEGATIVE Final    Comment:        The GeneXpert MRSA Assay (FDA approved for NASAL specimens only), is one component of a comprehensive MRSA colonization surveillance program. It is not intended to diagnose MRSA infection nor to guide or monitor treatment for MRSA infections. Performed at [facility], [place] [address] [place], [place] [address]   Urine Culture     Status: Abnormal (Preliminary result)   Collection Time:  03/17/18  8:30 AM  Result Value Ref Range Status   Specimen Description URINE, CLEAN CATCH  Final   Special Requests NONE  Final   Culture (A)  Final    >=100,000 COLONIES/mL UNIDENTIFIED ORGANISM Performed at [facility], [place] [address], [place], [place] [address]    Report Status PENDING  Incomplete  Aerobic Culture (superficial specimen)     Status: None (Preliminary result)   Collection Time: 03/17/18 11:51 AM  Result Value Ref Range Status   Specimen Description WOUND LEFT FOOT  Final   Special Requests PENDING  Incomplete   Gram Stain   Final    FEW WBC PRESENT,BOTH PMN AND MONONUCLEAR ABUNDANT GRAM POSITIVE COCCI ABUNDANT GRAM NEGATIVE RODS    Culture   Final    CULTURE REINCUBATED FOR BETTER GROWTH Performed at [facility], [place] [address], [place], [place] [address]    Report Status PENDING  Incomplete  Anaerobic culture     Status: None (Preliminary result)   Collection Time: 03/17/18  4:15 PM  Result Value Ref Range Status   Specimen Description WOUND LEFT FOOT  Final   Special Requests   Final    NONE Performed at [facility], [place] [address], [place], [place] [address]    Gram Stain PENDING  Incomplete   Culture   Final    NO ANAEROBES  ISOLATED; CULTURE IN PROGRESS FOR 5 DAYS   Report Status PENDING  Incomplete  Aerobic/Anaerobic Culture (surgical/deep wound)     Status: None (Preliminary result)   Collection Time: 03/17/18  8:28 PM  Result Value Ref Range Status   Specimen Description ABSCESS LEFT FOOT  Final   Special Requests   Final    PATIENT ON FOLLOWING ZOSYN VANC Performed at [facility], [place] [address], [place], [place] [address]    Gram Stain   Final    MODERATE WBC PRESENT, PREDOMINANTLY PMN MODERATE GRAM POSITIVE COCCI FEW GRAM NEGATIVE RODS    Culture   Final    CULTURE REINCUBATED FOR BETTER GROWTH NO ANAEROBES ISOLATED; CULTURE IN PROGRESS FOR 5 DAYS    Report Status PENDING  Incomplete    Impression/Plan:    1. Osteomyelitis - purulence noted deep and appears to have had exposed bone.  Will need prolonged treatment.  2.  AMS - likely from #1 and resolved.  MRI negative for acute findijngs  3.  Leukocytosis and fever - improving with treatment as above.

## 2018-03-18 NOTE — Progress Notes (Signed)
PT Cancellation Note  Patient Details Name: Kevin [name] MRN: [id number] DOB: Oct 27, 1940   Cancelled Treatment:    Reason Eval/Treat Not Completed: Active bedrest order.  Please update activity level if PT evaluation is desired.  Thanks.    Godfrey Pick White 03/18/2018, 8:28 AM [facility] [phone] (pager)

## 2018-03-18 NOTE — Progress Notes (Signed)
   Subjective: 1 Day Post-Op Procedure(s) (LRB): IRRIGATION AND DEBRIDEMENT FOOT (Left)  Pt resting currently in no acute distress Dressing in place with mild drainage  Objective:   VITALS:   Vitals:   03/18/18 0730 03/18/18 0800  BP: 130/70 (!) 150/79  Pulse: 71 79  Resp: (!) 26 (!) 27  Temp:  97.9 F (36.6 C)  SpO2: 97% 96%    Left foot: dressing in place with mild drainage to plantar aspect of his foot Cap refill less than 2 seconds No increased drainage or odor  LABS Recent Labs    03/16/18 2032 03/16/18 2039 03/17/18 0846 03/18/18 0254  HGB 13.1 13.6 11.9* 10.4*  HCT 39.4 40.0 35.5* 31.8*  WBC 25.9*  --  27.7* 19.7*  PLT 274  --  279 249    Recent Labs    03/16/18 2039 03/17/18 1148 03/18/18 0254  NA 124* 126* 135  K 4.4 3.9 3.5  BUN 32* 32* 25*  CREATININE 1.90* 1.71* 1.52*  GLUCOSE 312* 292* 103*     Assessment/Plan: 1 Day Post-Op Procedure(s) (LRB): IRRIGATION AND DEBRIDEMENT FOOT (Left) Continue wound care consult for left foot May discuss further treatment with [name] Given next week Continue current treatment May weight bear on heel only of the left foot    Kathrynn Speed, [place] [facility] now [employer] [facility] [address] [place], [place], [place] [address] Phone: [phone] [email or web address]  [employer]

## 2018-03-18 NOTE — Evaluation (Addendum)
Physical Therapy Evaluation Patient Details Name: Kevin [name] MRN: [id number] DOB: [birth year] Today's Date: 03/18/2018   History of Present Illness   [name] is a 77 y.o.-year-old male with a left diabetic foot ulcer with abscess.  He was admitted to the neuro ICU following  stroke like symptoms 03/16/18.  He was noted to have a left plantar foot ulcer.  During the course of the day 6/21 he was decompensating and becoming septic.  The wound was foul-smelling and concern was that the source of his sepsis was the infected foot.  Underwent I&D left foot ulcer 03/17/18.   Stroke symptoms seem to have resolved.    Clinical Impression  Pt admitted with above diagnosis. Pt currently with functional limitations due to the deficits listed below (see PT Problem List). Pt was able to stand and pivot but has difficulty with heel weight bearing.  May need a wheelchair at home depending on how long the MD wants heel weight bearing.  Pt may benefit from [facility] first program if he d/c's home as he appears to be a [facility] pt.  Also of note, pt desat on RA to 88% therefore replaced 3LO2 with sats 94% and greater.  HR up to 128 bpm with activity as well.  MD:  May benefit from Darco shoe - please order if you agree.   Pt will benefit from skilled PT to increase their independence and safety with mobility to allow discharge to the venue listed below.    Follow Up Recommendations Home health PT;Supervision/Assistance - 24 hour(If wife can't provide 24 hour care, SNF recommended)    Equipment Recommendations  Rolling walker with 5" wheels;3in1 (PT);Wheelchair (18x18 lightweight with anti tippers, desk armrests, elevating legrests);Wheelchair cushion (18x18 pressure relieving cushion)(Tall equipment needed as pt is 6 feet 1 inch)    Recommendations for Other Services       Precautions / Restrictions Precautions Precautions: Fall Restrictions Weight Bearing Restrictions: Yes LLE Weight Bearing: (weight bear  on heel only)      Mobility  Bed Mobility Overal bed mobility: Independent                Transfers Overall transfer level: Needs assistance Equipment used: Rolling walker (2 wheeled) Transfers: Sit to/from [facility] Sit to Stand: Min assist;From elevated surface Stand pivot transfers: Min assist;From elevated surface       General transfer comment: Needed assist to power up and cues to maintain weight on left heel.  Pt somewhat shaky and needed cues to use UEs.  Pt did weight bear on left heel with cues.    Ambulation/Gait             General Gait Details: unable  Stairs            Wheelchair Mobility    Modified Rankin (Stroke Patients Only) Modified Rankin (Stroke Patients Only) Pre-Morbid Rankin Score: No symptoms Modified Rankin: Moderately severe disability     Balance Overall balance assessment: Needs assistance Sitting-balance support: No upper extremity supported;Feet supported Sitting balance-Leahy Scale: Fair     Standing balance support: Bilateral upper extremity supported;During functional activity Standing balance-Leahy Scale: Poor Standing balance comment: relies heavily on UEs for support                             Pertinent Vitals/Pain Pain Assessment: Faces Faces Pain Scale: Hurts a little bit Pain Location: left foot Pain Descriptors / Indicators: Grimacing Pain  Intervention(s): Limited activity within patient's tolerance;Monitored during session;Repositioned    Home Living Family/patient expects to be discharged to:: Private residence Living Arrangements: Spouse/significant other Available Help at Discharge: Family;Friend(s);Available 24 hours/day Type of Home: House Home Access: Stairs to enter Entrance Stairs-Rails: Right;Left;Can reach both Entrance Stairs-Number of Steps: 2 Home Layout: One level Home Equipment: None      Prior Function Level of Independence: Independent                Hand Dominance   Dominant Hand: Right    Extremity/Trunk Assessment   Upper Extremity Assessment Upper Extremity Assessment: Defer to OT evaluation    Lower Extremity Assessment Lower Extremity Assessment: Generalized weakness;LLE deficits/detail LLE: Unable to fully assess due to pain    Cervical / Trunk Assessment Cervical / Trunk Assessment: Normal  Communication   Communication: No difficulties  Cognition Arousal/Alertness: Awake/alert Behavior During Therapy: Anxious Overall Cognitive Status: Impaired/Different from baseline Area of Impairment: Orientation;Memory;Following commands;Safety/judgement;Problem solving                 Orientation Level: Disoriented to;Time;Situation   Memory: Decreased short-term memory Following Commands: Follows one step commands with increased time Safety/Judgement: Decreased awareness of safety;Decreased awareness of deficits   Problem Solving: Slow processing;Decreased initiation;Difficulty sequencing;Requires verbal cues;Requires tactile cues        General Comments      Exercises     Assessment/Plan    PT Assessment Patient needs continued PT services  PT Problem List Decreased activity tolerance;Decreased balance;Decreased mobility;Decreased knowledge of use of DME;Decreased safety awareness;Decreased knowledge of precautions;Cardiopulmonary status limiting activity;Pain       PT Treatment Interventions DME instruction;Gait training;Functional mobility training;Therapeutic activities;Therapeutic exercise;Balance training;Patient/family education;Stair training    PT Goals (Current goals can be found in the Care Plan section)  Acute Rehab PT Goals Patient Stated Goal: to get better PT Goal Formulation: With patient Time For Goal Achievement: 04/01/18 Potential to Achieve Goals: Good    Frequency Min 3X/week   Barriers to discharge        Co-evaluation               AM-PAC PT "6 Clicks"  Daily Activity  Outcome Measure Difficulty turning over in bed (including adjusting bedclothes, sheets and blankets)?: None Difficulty moving from lying on back to sitting on the side of the bed? : None Difficulty sitting down on and standing up from a chair with arms (e.g., wheelchair, bedside commode, etc,.)?: A Lot Help needed moving to and from a bed to chair (including a wheelchair)?: A Lot Help needed walking in hospital room?: Total Help needed climbing 3-5 steps with a railing? : Total 6 Click Score: 14    End of Session Equipment Utilized During Treatment: Gait belt;Oxygen Activity Tolerance: Patient limited by fatigue Patient left: in chair;with call bell/phone within reach;with nursing/sitter in room Nurse Communication: Mobility status PT Visit Diagnosis: Unsteadiness on feet (R26.81);Pain Pain - Right/Left: Left Pain - part of body: Ankle and joints of foot    Time: 1115-1138 PT Time Calculation (min) (ACUTE ONLY): 23 min   Charges:   PT Evaluation $PT Eval Moderate Complexity: 1 Mod PT Treatments $Therapeutic Activity: 8-22 mins   PT G Codes:        Dawn White,PT Acute Rehabilitation [phone] (pager)   Denice Paradise 03/18/2018, 12:53 PM

## 2018-03-18 NOTE — Progress Notes (Signed)
   Echocardiogram - Left ventricle: The cavity size was normal. Systolic function was   normal. The estimated ejection fraction was in the range of 60%   to 65%. Wall motion was normal; there were no regional wall   motion abnormalities. There was no evidence of elevated   ventricular filling pressure by Doppler parameters. - Left atrium: The atrium was moderately dilated.  Impressions:  - No cardiac source of emboli was indentified.  Telemetry  -Sinus tachycardia, frequent PACs, occasional PVCs, occasional aberrantly conducted QRS complexes.  Echocardiogram demonstrates normal EF.  Okay to continue with metoprolol.  Increased heart rate, irritability likely a result of underlying infection as well.  If he has ongoing fevers/positive blood cultures despite treatment of his foot infection, we can consider at that time.  Clearly on transthoracic echocardiogram, there is no evidence of large vegetations present, no evidence of any severe valvular abnormalities.  Please let us know if we can be of further assistance.  Thank you.  Candee Furbish, MD

## 2018-03-18 NOTE — Progress Notes (Signed)
OT Cancellation Note  Patient Details Name: Mukesh Kornegay MRN: [id number] DOB: [birth year]   Cancelled Treatment:    Reason Eval/Treat Not Completed: Active bedrest order  [place], OTR/L Acute Rehab Pager: [phone] Office: [phone] 03/18/2018, 7:14 AM

## 2018-03-19 DIAGNOSIS — B952 Enterococcus as the cause of diseases classified elsewhere: Secondary | ICD-10-CM

## 2018-03-19 LAB — BASIC METABOLIC PANEL
Anion gap: 9 (ref 5–15)
BUN: 23 mg/dL — ABNORMAL HIGH (ref 6–20)
CO2: 22 mmol/L (ref 22–32)
Calcium: 7.4 mg/dL — ABNORMAL LOW (ref 8.9–10.3)
Chloride: 101 mmol/L (ref 101–111)
Creatinine, Ser: 1.5 mg/dL — ABNORMAL HIGH (ref 0.61–1.24)
GFR calc Af Amer: 51 mL/min — ABNORMAL LOW (ref >60)
GFR calc non Af Amer: 44 mL/min — ABNORMAL LOW (ref >60)
Glucose, Bld: 230 mg/dL — ABNORMAL HIGH (ref 65–99)
Potassium: 3.3 mmol/L — ABNORMAL LOW (ref 3.5–5.1)
Sodium: 132 mmol/L — ABNORMAL LOW (ref 135–145)

## 2018-03-19 LAB — GLUCOSE, CAPILLARY
Glucose-Capillary: 131 mg/dL — ABNORMAL HIGH (ref 65–99)
Glucose-Capillary: 93 mg/dL (ref 65–99)
Glucose-Capillary: 206 mg/dL — ABNORMAL HIGH (ref 65–99)
Glucose-Capillary: 265 mg/dL — ABNORMAL HIGH (ref 65–99)
Glucose-Capillary: 171 mg/dL — ABNORMAL HIGH (ref 65–99)
Glucose-Capillary: 188 mg/dL — ABNORMAL HIGH (ref 65–99)
Glucose-Capillary: 230 mg/dL — ABNORMAL HIGH (ref 65–99)

## 2018-03-19 LAB — URINE CULTURE: Culture: >=100000 — AB

## 2018-03-19 LAB — PROCALCITONIN: Procalcitonin: 8.12 ng/mL

## 2018-03-19 LAB — MAGNESIUM: Magnesium: 1.9 mg/dL (ref 1.7–2.4)

## 2018-03-19 MED ORDER — INSULIN ASPART 100 UNIT/ML ~~LOC~~ SOLN
10.0000 [IU] | Freq: Once | SUBCUTANEOUS | Status: AC
  Administered 2018-03-19: 10 [IU] via SUBCUTANEOUS

## 2018-03-19 MED ORDER — METOPROLOL TARTRATE 5 MG/5ML IV SOLN
5.0000 mg | INTRAVENOUS | Status: DC | PRN
  Administered 2018-03-19 – 2018-03-20 (×3): 5 mg via INTRAVENOUS
  Filled 2018-03-19 (×3): qty 5

## 2018-03-19 NOTE — Evaluation (Signed)
Occupational Therapy Evaluation Patient Details Name: Kevin [name] MRN: [id number] DOB: [birth year] Today's Date: 03/19/2018    History of Present Illness  [name] is a 77 y.o.-year-old male with a left diabetic foot ulcer with abscess.  He was admitted to the neuro ICU following  stroke like symptoms 03/16/18.  He was noted to have a left plantar foot ulcer.  During the course of the day 6/21 he was decompensating and becoming septic.  The wound was foul-smelling and concern was that the source of his sepsis was the infected foot.  Underwent I&D left foot ulcer 03/17/18.   Stroke symptoms seem to have resolved.     Clinical Impression   PTA, pt was living with his wife and was independent. Pt currently requiring Min A for UB ADLs, Mod A for LB ADLs, and Min-Mod A for functional mobility with RW. Pt presenting with decreased strength, balance, safety awareness, and functional use of LUE. Pt reporting fall prior to admission where he landed on his left shoulder; noted limited ROM and popping feeling with movement. RN notified. Pt requiring min cues for safety and adherence to WB status. Pt will require further acute OT to facilitate safe dc. If eligible, pt would benefit from home first program for increased initial therapy to optimize return to PLOF and increase safety. Recommending dc home with 24/7 support and HHOT to increase safety and independence with ADLs and functional mobility.   Vitals: HR 130-140s with acitvity. SPO2 90s on RA.  Of note. Pt may benefit from Darco shoe to increase adherence to WBing status and safety with functional mobility - if MD agrees.    Follow Up Recommendations  Home health OT;Supervision/Assistance - 24 hour ; Would benefit from home first program if eligible for increased initial therapy.    Equipment Recommendations       Recommendations for Other Services PT consult     Precautions / Restrictions Precautions Precautions:  Fall Restrictions Weight Bearing Restrictions: Yes LLE Weight Bearing: (weight bear on L heel only)      Mobility Bed Mobility Overal bed mobility: Independent                Transfers Overall transfer level: Needs assistance Equipment used: Rolling walker (2 wheeled) Transfers: Sit to/from Stand Sit to Stand: Min assist;From elevated surface         General transfer comment: MIn A for power up and then to gain balance in standing. VCs for good positioning in preparation for standing    Balance Overall balance assessment: Needs assistance Sitting-balance support: No upper extremity supported;Feet supported Sitting balance-Leahy Scale: Fair     Standing balance support: Bilateral upper extremity supported;During functional activity Standing balance-Leahy Scale: Poor Standing balance comment: relies heavily on UEs for support                           ADL either performed or assessed with clinical judgement   ADL Overall ADL's : Needs assistance/impaired Eating/Feeding: Set up;Sitting   Grooming: Set up;Sitting;Supervision/safety   Upper Body Bathing: Minimal assistance;Sitting   Lower Body Bathing: Moderate assistance;Sit to/from stand   Upper Body Dressing : Minimal assistance;Sitting   Lower Body Dressing: Moderate assistance;Sit to/from stand Lower Body Dressing Details (indicate cue type and reason): Mod A for standing balance. Pt able to bend forward at hips while sitting at EOB to adjust socks. Toilet Transfer: Moderate assistance;RW;Ambulation Toilet Transfer Details (indicate cue type and reason): Pt  performing simulated toilet transfer with room level mobility. Required Mod A for safety and balance. VCs for RW management and wbing         Functional mobility during ADLs: Moderate assistance;Rolling walker General ADL Comments: Pt demonstrating decreased functional performance compared to baseline. Pt with decreased strength, balance,  cognition, and activity tolerance. [place] HR elevated to 130-140s with activity     Vision Baseline Vision/History: Wears glasses Wears Glasses: At all times Patient Visual Report: No change from baseline       Perception     Praxis      Pertinent Vitals/Pain Pain Assessment: Faces Faces Pain Scale: Hurts a little bit Pain Location: left foot Pain Descriptors / Indicators: Grimacing Pain Intervention(s): Monitored during session;Limited activity within patient's tolerance;Repositioned     Hand Dominance Right   Extremity/Trunk Assessment Upper Extremity Assessment Upper Extremity Assessment: LUE deficits/detail LUE Deficits / Details: Pt reporting left shoulder injury. Pt with limied shoulder movement only about to perform AROM (forward flexion and abduction) 0-80degrees. Noted compensatory hiking ~70 degrees. Pt with [facility] elbow, wrist, and hand ROM.  Decreased strength for groos and fine motor. LUE Coordination: decreased gross motor   Lower Extremity Assessment Lower Extremity Assessment: Defer to PT evaluation   Cervical / Trunk Assessment Cervical / Trunk Assessment: Normal   Communication Communication Communication: No difficulties   Cognition Arousal/Alertness: Awake/alert Behavior During Therapy: WFL for tasks assessed/performed Overall Cognitive Status: Impaired/Different from baseline Area of Impairment: Following commands;Safety/judgement;Problem solving;Awareness                       Following Commands: Follows one step commands with increased time Safety/Judgement: Decreased awareness of safety;Decreased awareness of deficits Awareness: Emergent Problem Solving: Slow processing;Difficulty sequencing;Requires verbal cues General Comments: Pt with decreased safety awarness and some impulsivity. Requiring increased cues for safety. Able to answer questions about home correctly with wife confirming information.    General Comments  Wife present  throughout session. SpO2 in 90s on RA. HR elevating to 130-140s with activity    Exercises     Shoulder Instructions      Home Living Family/patient expects to be discharged to:: Private residence Living Arrangements: Spouse/significant other Available Help at Discharge: Family;Friend(s);Available 24 hours/day Type of Home: House Home Access: Stairs to enter [employer] of Steps: 2 Entrance Stairs-Rails: Right;Left;Can reach both Home Layout: One level     Bathroom Shower/Tub: Occupational psychologist: Standard     Home Equipment: None      Lives With: Spouse    Prior Functioning/Environment Level of Independence: Independent        Comments: ADls, IADLs, driving, retired        OT Problem List: Decreased strength;Decreased range of motion;Decreased activity tolerance;Impaired balance (sitting and/or standing);Decreased safety awareness;Decreased knowledge of use of DME or AE;Decreased knowledge of precautions;Pain;Impaired UE functional use      OT Treatment/Interventions: Self-care/ADL training;Therapeutic exercise;Energy conservation;DME and/or AE instruction;Therapeutic activities;Patient/family education    OT Goals(Current goals can be found in the care plan section) Acute Rehab OT Goals Patient Stated Goal: to get better OT Goal Formulation: With patient/family Time For Goal Achievement: 04/02/18 Potential to Achieve Goals: Good ADL Goals Pt Will Perform Grooming: with set-up;with supervision;standing(Maintaining WBing status) Pt Will Perform Upper Body Dressing: with modified independence;sitting Pt Will Perform Lower Body Dressing: with set-up;with supervision;sit to/from stand Pt Will Transfer to Toilet: with set-up;with supervision;bedside commode;ambulating Pt Will Perform Toileting - Clothing Manipulation and hygiene: with  set-up;with supervision;sit to/from stand  OT Frequency: Min 3X/week   Barriers to D/C:             Co-evaluation              AM-PAC PT "6 Clicks" Daily Activity     Outcome Measure Help from another person eating meals?: None Help from another person taking care of personal grooming?: A Little Help from another person toileting, which includes using toliet, bedpan, or urinal?: A Little Help from another person bathing (including washing, rinsing, drying)?: A Lot Help from another person to put on and taking off regular upper body clothing?: A Little Help from another person to put on and taking off regular lower body clothing?: A Lot 6 Click Score: 17   End of Session Equipment Utilized During Treatment: Gait belt;Rolling walker Nurse Communication: Mobility status;Weight bearing status;Other (comment)(HR, SpO2, shoulder injury)  Activity Tolerance: Patient tolerated treatment well Patient left: in chair;with call bell/phone within reach;with family/visitor present  OT Visit Diagnosis: Unsteadiness on feet (R26.81);Other abnormalities of gait and mobility (R26.89);Muscle weakness (generalized) (M62.81);Pain;Other symptoms and signs involving cognitive function Pain - Right/Left: Left Pain - part of body: Ankle and joints of foot                Time: 4196-2229 OT Time Calculation (min): 38 min Charges:  OT General Charges $OT Visit: 1 Visit OT Evaluation $OT Eval Moderate Complexity: 1 Mod OT Treatments $Self Care/Home Management : 23-37 mins G-Codes:     Charis Capehart MSOT, OTR/L Acute Rehab Pager: [phone] Office: [place] 03/19/2018, 4:12 PM

## 2018-03-19 NOTE — Progress Notes (Signed)
Neurology Progress Note      Interval Hx:   He is doing well neurologically with practically no weakness now on the left side and his left hand is also improving.he is responding to his antibiotics and sepsis seems to be under control. Appreciate help of pulmonary critical care medicine, infectious disease, cardiology and orthopedic surgery     Physical Examination: Blood pressure 114/66, pulse (!) 107, temperature 98.1 F (36.7 C), temperature source Axillary, resp. rate (!) 26, height _0  (1.854 m), weight 215 lb 9.8 oz (97.8 kg), SpO2 97 %.  HEENT: [place]/AT Lungs: Respirations unlabored Ext: LLE has  Bandage from surgical debridement from 03/17/18 Neurologic Examination: Mental Status: Alert, can answer his name, but is confused and gives incorrect answers to rest of questions. He cannot complete a sentence without becoming sob. Speech is dysarthric. He poorly attends and has difficulty following commands.  Cranial Nerves: II:  Visual fields intact without extinction to DSS. PERRL.  III,IV, VI: EOMI, PERRL. No ptosis.  V,VII: Subtle left facial droop. Facial temp sensation equal bilaterally VIII: hearing intact to voice IX,X: Palate rises symmetrically XI: Head at midline XII: midline tongue extension  Motor: RUE: 5/5 LUE: 4+/5 no.drift present. minimal weakness of left grip and intrinsic hand muscles  RLE 5/5 LLE: 4+/5 Sensory: Decreased sensation in left foot. Positive extinction to FT RUE and RLE.  Deep Tendon Reflexes:  1+ bilateral brachioradialis and biceps 1+ patellae bilaterally 0 achilles bilaterally Plantars: Right: [place]   Left: tonically upgoing Cerebellar: Prominent ataxia with FNF on left. No definite ataxia with H-S bilaterally. Prominent action tremor bilateral upper ext.  Gait: Deferred due to acuity of presentation   Results for orders placed or performed during the hospital encounter of 03/16/18 (from the past 48 hour(s))  Lactic acid, plasma     Status:  None   Collection Time: 03/17/18  2:35 PM  Result Value Ref Range   Lactic Acid, Venous 1.2 0.5 - 1.9 mmol/L    Comment: Performed at [facility], [place] [address] [place], [place] [address]  Anaerobic culture     Status: None (Preliminary result)   Collection Time: 03/17/18  4:15 PM  Result Value Ref Range   Specimen Description WOUND LEFT FOOT    Special Requests      NONE Performed at [facility], [place] [address], [place], [place] [address]    Gram Stain PENDING    Culture      NO ANAEROBES ISOLATED; CULTURE IN PROGRESS FOR 5 DAYS   Report Status PENDING   Glucose, capillary     Status: Abnormal   Collection Time: 03/17/18  4:35 PM  Result Value Ref Range   Glucose-Capillary 131 (H) 65 - 99 mg/dL  Troponin I (q 6hr x 3)     Status: Abnormal   Collection Time: 03/17/18  4:50 PM  Result Value Ref Range   Troponin I 0.14 (HH) <0.03 ng/mL    Comment: CRITICAL VALUE NOTED.  VALUE IS CONSISTENT WITH PREVIOUSLY REPORTED AND CALLED VALUE. Performed at [facility], [place] [address] [place], [place] [address]   Aerobic/Anaerobic Culture (surgical/deep wound)     Status: None (Preliminary result)   Collection Time: 03/17/18  8:28 PM  Result Value Ref Range   Specimen Description ABSCESS LEFT FOOT    Special Requests      PATIENT ON FOLLOWING ZOSYN VANC Performed at [facility], [place] [address], [place], [place] [address]  Gram Stain      MODERATE WBC PRESENT, PREDOMINANTLY PMN MODERATE GRAM POSITIVE COCCI FEW GRAM NEGATIVE RODS    Culture      CULTURE REINCUBATED FOR BETTER GROWTH NO ANAEROBES ISOLATED; CULTURE IN PROGRESS FOR 5 DAYS    Report Status PENDING   Glucose, capillary     Status: None   Collection Time: 03/17/18  8:51 PM  Result Value Ref Range   Glucose-Capillary 88 65 - 99 mg/dL  Troponin I (q 6hr x 3)     Status: Abnormal   Collection Time: 03/17/18 11:15 PM  Result Value Ref Range   Troponin I 0.11 (HH) <0.03 ng/mL    Comment:  CRITICAL VALUE NOTED.  VALUE IS CONSISTENT WITH PREVIOUSLY REPORTED AND CALLED VALUE. Performed at [facility], [place] [address] [place], [place] [address]   Glucose, capillary     Status: Abnormal   Collection Time: 03/17/18 11:41 PM  Result Value Ref Range   Glucose-Capillary 126 (H) 65 - 99 mg/dL  Procalcitonin     Status: None   Collection Time: 03/18/18  2:54 AM  Result Value Ref Range   Procalcitonin 17.79 ng/mL    Comment:        Interpretation: PCT >= 10 ng/mL: Important systemic inflammatory response, almost exclusively due to severe bacterial sepsis or septic shock. (NOTE)       Sepsis PCT Algorithm           Lower Respiratory Tract                                      Infection PCT Algorithm    ----------------------------     ----------------------------         PCT < 0.25 ng/mL                PCT < 0.10 ng/mL         Strongly encourage             Strongly discourage   discontinuation of antibiotics    initiation of antibiotics    ----------------------------     -----------------------------       PCT 0.25 - 0.50 ng/mL            PCT 0.10 - 0.25 ng/mL               OR       >80% decrease in PCT            Discourage initiation of                                            antibiotics      Encourage discontinuation           of antibiotics    ----------------------------     -----------------------------         PCT >= 0.50 ng/mL              PCT 0.26 - 0.50 ng/mL                AND       <80% decrease in PCT             Encourage initiation of  antibiotics       Encourage continuation           of antibiotics    ----------------------------     -----------------------------        PCT >= 0.50 ng/mL                  PCT > 0.50 ng/mL               AND         increase in PCT                  Strongly encourage                                      initiation of antibiotics    Strongly encourage escalation            of antibiotics                                     -----------------------------                                           PCT <= 0.25 ng/mL                                                 OR                                        > 80% decrease in PCT                                     Discontinue / Do not initiate                                             antibiotics Performed at [facility], [address] [place], [place] [address]   CBC     Status: Abnormal   Collection Time: 03/18/18  2:54 AM  Result Value Ref Range   WBC 19.7 (H) 4.0 - 10.5 K/uL   RBC 3.55 (L) 4.22 - 5.81 MIL/uL   Hemoglobin 10.4 (L) 13.0 - 17.0 g/dL   HCT 31.8 (L) 39.0 - 52.0 %   MCV 89.6 78.0 - 100.0 fL   MCH 29.3 26.0 - 34.0 pg   MCHC 32.7 30.0 - 36.0 g/dL   RDW 12.5 11.5 - 15.5 %   Platelets 249 150 - 400 K/uL    Comment: Performed at [facility], [place] [address], [place], [place] [address]  Renal function panel     Status: Abnormal   Collection Time: 03/18/18  2:54 AM  Result Value Ref Range   Sodium 135 135 - 145 mmol/L   Potassium 3.5 3.5 - 5.1 mmol/L   Chloride 101 101 -  111 mmol/L   CO2 22 22 - 32 mmol/L   Glucose, Bld 103 (H) 65 - 99 mg/dL   BUN 25 (H) 6 - 20 mg/dL   Creatinine, Ser 1.52 (H) 0.61 - 1.24 mg/dL   Calcium 7.2 (L) 8.9 - 10.3 mg/dL   Phosphorus 3.2 2.5 - 4.6 mg/dL   Albumin 2.0 (L) 3.5 - 5.0 g/dL   GFR calc non Af Amer 43 (L) >60 mL/min   GFR calc Af Amer 50 (L) >60 mL/min    Comment: (NOTE) The eGFR has been calculated using the CKD EPI equation. This calculation has not been validated in all clinical situations. eGFR's persistently <60 mL/min signify possible Chronic Kidney Disease.    Anion gap 12 5 - 15    Comment: Performed at [facility] [address] [place], [place] [address]  Magnesium     Status: None   Collection Time: 03/18/18  2:54 AM  Result Value Ref Range   Magnesium 2.0 1.7 - 2.4 mg/dL    Comment: Performed at [facility] [address], [place], [place] [address]  Glucose, capillary     Status: Abnormal   Collection Time: 03/18/18  4:37 AM  Result Value Ref Range   Glucose-Capillary 106 (H) 65 - 99 mg/dL  Glucose, capillary     Status: Abnormal   Collection Time: 03/18/18  7:54 AM  Result Value Ref Range   Glucose-Capillary 118 (H) 65 - 99 mg/dL  Glucose, capillary     Status: Abnormal   Collection Time: 03/18/18 11:37 AM  Result Value Ref Range   Glucose-Capillary 171 (H) 65 - 99 mg/dL   Comment 1 Notify RN    Comment 2 Document in Chart   Glucose, capillary     Status: Abnormal   Collection Time: 03/18/18  4:03 PM  Result Value Ref Range   Glucose-Capillary 146 (H) 65 - 99 mg/dL   Comment 1 Notify RN    Comment 2 Document in Chart   Glucose, capillary     Status: Abnormal   Collection Time: 03/18/18  8:47 PM  Result Value Ref Range   Glucose-Capillary 169 (H) 65 - 99 mg/dL  Glucose, capillary     Status: Abnormal   Collection Time: 03/18/18 11:18 PM  Result Value Ref Range   Glucose-Capillary 178 (H) 65 - 99 mg/dL  Procalcitonin     Status: None   Collection Time: 03/19/18  2:47 AM  Result Value Ref Range   Procalcitonin 8.12 ng/mL    Comment:        Interpretation: PCT > 2 ng/mL: Systemic infection (sepsis) is likely, unless other causes are known. (NOTE)       Sepsis PCT Algorithm           Lower Respiratory Tract                                      Infection PCT Algorithm    ----------------------------     ----------------------------         PCT < 0.25 ng/mL                PCT < 0.10 ng/mL         Strongly encourage             Strongly discourage   discontinuation of antibiotics    initiation of antibiotics    ----------------------------     -----------------------------  PCT 0.25 - 0.50 ng/mL            PCT 0.10 - 0.25 ng/mL               OR       >80% decrease in PCT            Discourage initiation of                                            antibiotics       Encourage discontinuation           of antibiotics    ----------------------------     -----------------------------         PCT >= 0.50 ng/mL              PCT 0.26 - 0.50 ng/mL               AND       <80% decrease in PCT              Encourage initiation of                                             antibiotics       Encourage continuation           of antibiotics    ----------------------------     -----------------------------        PCT >= 0.50 ng/mL                  PCT > 0.50 ng/mL               AND         increase in PCT                  Strongly encourage                                      initiation of antibiotics    Strongly encourage escalation           of antibiotics                                     -----------------------------                                           PCT <= 0.25 ng/mL                                                 OR                                        > 80% decrease in PCT  Discontinue / Do not initiate                                             antibiotics Performed at [facility], [place] [address], [place], [place] [address]   Glucose, capillary     Status: Abnormal   Collection Time: 03/19/18  3:11 AM  Result Value Ref Range   Glucose-Capillary 131 (H) 65 - 99 mg/dL  Glucose, capillary     Status: None   Collection Time: 03/19/18  8:26 AM  Result Value Ref Range   Glucose-Capillary 93 65 - 99 mg/dL  Basic metabolic panel     Status: Abnormal   Collection Time: 03/19/18 10:10 AM  Result Value Ref Range   Sodium 132 (L) 135 - 145 mmol/L   Potassium 3.3 (L) 3.5 - 5.1 mmol/L   Chloride 101 101 - 111 mmol/L   CO2 22 22 - 32 mmol/L   Glucose, Bld 230 (H) 65 - 99 mg/dL   BUN 23 (H) 6 - 20 mg/dL   Creatinine, Ser 1.50 (H) 0.61 - 1.24 mg/dL   Calcium 7.4 (L) 8.9 - 10.3 mg/dL   GFR calc non Af Amer 44 (L) >60 mL/min   GFR calc Af Amer 51 (L) >60 mL/min    Comment: (NOTE) The eGFR has  been calculated using the CKD EPI equation. This calculation has not been validated in all clinical situations. eGFR's persistently <60 mL/min signify possible Chronic Kidney Disease.    Anion gap 9 5 - 15    Comment: Performed at [facility] [address], [place], [place] [address]  Magnesium     Status: None   Collection Time: 03/19/18 10:10 AM  Result Value Ref Range   Magnesium 1.9 1.7 - 2.4 mg/dL    Comment: Performed at [facility] [address] [place], [place] [address]  Glucose, capillary     Status: Abnormal   Collection Time: 03/19/18 11:57 AM  Result Value Ref Range   Glucose-Capillary 206 (H) 65 - 99 mg/dL   Ct Head Wo Contrast  Result Date: 03/17/2018 CLINICAL DATA:  Septic, increasing confusion. 24 hours after tPA. History of hypertension, diabetes. EXAM: CT HEAD WITHOUT CONTRAST TECHNIQUE: Contiguous axial images were obtained from the base of the skull through the vertex without intravenous contrast. COMPARISON:  CT HEAD March 16, 2018 FINDINGS: BRAIN: No intraparenchymal hemorrhage, mass effect nor midline shift. The ventricles and sulci are normal for age. Patchy supratentorial white matter hypodensities within normal range for patient's age, though non-specific are most compatible with chronic small vessel ischemic disease. No acute large vascular territory infarcts. No abnormal extra-axial fluid collections. Basal cisterns are patent. VASCULAR: Mild-to-moderate calcific atherosclerosis of the carotid siphons. SKULL: No skull fracture. No significant scalp soft tissue swelling. SINUSES/ORBITS: Chronic bony remodeling LEFT maxillary sinus compatible with chronic sinusitis, no acute component. Mastoid air cells are well aerated.The included ocular globes and orbital contents are non-suspicious. OTHER: None. IMPRESSION: Negative non-contrast CT HEAD for age. Electronically Signed   By: Elon Alas M.D.   On: 03/17/2018 17:51   Mr Brain Wo  Contrast  Result Date: 03/18/2018 CLINICAL DATA:  Expressive aphasia. Left arm and leg weakness. Status post tPA on 03/16/2018. EXAM: MRI HEAD WITHOUT CONTRAST TECHNIQUE: Multiplanar, multiecho pulse sequences of the brain and surrounding structures were obtained without intravenous  contrast. COMPARISON:  03/17/2018 FINDINGS: Brain: There is no evidence of acute infarct, intracranial hemorrhage, mass, midline shift, or extra-axial fluid collection. There is mild cerebral atrophy. Scattered subcortical and deep cerebral white matter T2 hyperintensities are nonspecific but compatible with mild chronic small vessel ischemic disease. 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 Vascular: Major intracranial vascular flow voids are preserved. Skull and upper cervical spine: Unremarkable bone marrow signal. Sinuses/Orbits: Unremarkable orbits. Chronic left maxillary sinusitis. No significant mastoid fluid. Other: None. IMPRESSION: 1. No acute intracranial abnormality. 2. Mild chronic small vessel ischemic disease. Electronically Signed   By: Logan Bores M.D.   On: 03/18/2018 14:50    Assessment: 77 y.o. male presenting with acute right hemispheric stroke symptoms, treated with IV tPA. He has a tunneling wound in the left foot.and likely sepsis related to that  # Acute Stroke symptoms: Dysphasia has resolved except for minor naming and comprehension deficit. Prominent LUE weakness and subtle LLE weakness still present.   -MRI pending for better etiology but will have to hold given his worsening medical status  -LDL 39, much under goal <70. Continue statin    -CTA head/neck: no LVO. 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 Carotids show no stenosis.  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 SSI during admit, hold po hm meds # History of ulcerative colitis. On Mesalamine.  # Left foot wound- wound care has changed dressing and this is currently oozing a foul odor and has become  quite red/swollen overnight. There is tunneling down to bone exposed. Ortho consulted, possible trip to OR for washout ASAP given this is likely the leading cause of his septic decline seen today. Wound cx ordered. Left foot Xray ordered. # Leukocytosis 25.9 and now with low gr fever- ID and ortho consulted. Sepsis work up and tx started   Plan: Appreciate help of pulmonary critical care medicine, cardiology and infectious disease.  and mobilize out of bed. Physical occupational therapy consults. Continue  Vanco and Zosyn, will as ID to adjust abx as needed pending septic wk up Transfer  To neurology floor bed if okay with critical care medicine.no family available at bedside for discussion Discuss with pulmonary critical care M.D. And infectious disease MD    I have personally examined this patient, reviewed notes, independently viewed imaging studies, participated in medical decision making and plan of care.ROS completed by me personally and pertinent positives fully documented  I have made any additions or clarifications directly to the above note.   Wife not available at the bedside  .This patient is critically ill and at significant risk of neurological worsening, death and care requires constant monitoring of vital signs, hemodynamics,respiratory and cardiac monitoring, extensive review of multiple databases, frequent neurological assessment, discussion with family, other specialists and medical decision making of high complexity.I have made any additions or clarifications directly to the above note.This critical care time does not reflect procedure time, or teaching time or supervisory time of PA/NP/Med Resident etc but could involve care discussion time.  I spent 32 minutes of neurocritical care time  in the care of  this patient.      Antony Contras, MD Medical Director [facility] Pager: [phone] 03/19/2018 12:49 PM

## 2018-03-19 NOTE — Progress Notes (Signed)
Pt have had 2 consecutive CBGs >200, Dr.Maslonka aware. Don't advance to phase 2 of glycemic control, Give 10 units of novolog once, and recheck in one hour. Change regular diet to carb modified.

## 2018-03-19 NOTE — Progress Notes (Signed)
[name] for Infectious Disease   Reason for visit: Follow up on osteomyelitis  Interval History: continues to feel better,   Afebrile.  Culture noted and Group C Strep and also abundant 'unidentified organism'.  Enterococcus in urine, amp sensitive.  No chills.  No associated rash, diarrhea.  Wife at bedside  Physical Exam: Constitutional:  Vitals:   03/19/18 1200 03/19/18 1300  BP: 124/71 117/65  Pulse: (!) 118 (!) 107  Resp: 17 (!) 24  Temp: 98.1 F (36.7 C)   SpO2: 98% 99%   patient appears in NAD. Alert, pleasant and interactive Respiratory: Normal respiratory effort; CTA B Cardiovascular: RRR GI: soft, nt, nd  Review of Systems: Constitutional: negative for fevers and chills Gastrointestinal: negative for nausea and diarrhea Integument/breast: negative for rash Neurological: negative for confusion  Lab Results  Component Value Date   WBC 19.7 (H) 03/18/2018   HGB 10.4 (L) 03/18/2018   HCT 31.8 (L) 03/18/2018   MCV 89.6 03/18/2018   PLT 249 03/18/2018    Lab Results  Component Value Date   CREATININE 1.50 (H) 03/19/2018   BUN 23 (H) 03/19/2018   NA 132 (L) 03/19/2018   K 3.3 (L) 03/19/2018   CL 101 03/19/2018   CO2 22 03/19/2018    Lab Results  Component Value Date   ALT 46 03/16/2018   AST 62 (H) 03/16/2018   ALKPHOS 117 03/16/2018     Microbiology: Recent Results (from the past 240 hour(s))  MRSA PCR Screening     Status: None   Collection Time: 03/16/18 10:37 PM  Result Value Ref Range Status   MRSA by PCR NEGATIVE NEGATIVE Final    Comment:        The GeneXpert MRSA Assay (FDA approved for NASAL specimens only), is one component of a comprehensive MRSA colonization surveillance program. It is not intended to diagnose MRSA infection nor to guide or monitor treatment for MRSA infections. Performed at [facility], [place] [address] [place], [place] [address]   Urine Culture     Status: Abnormal   Collection Time: 03/17/18   8:30 AM  Result Value Ref Range Status   Specimen Description URINE, CLEAN CATCH  Final   Special Requests   Final    NONE Performed at [facility], [place] [address] [place], [place] [address]    Culture >=100,000 COLONIES/mL ENTEROCOCCUS FAECALIS (A)  Final   Report Status 03/19/2018 FINAL  Final   Organism ID, Bacteria ENTEROCOCCUS FAECALIS (A)  Final      Susceptibility   Enterococcus faecalis - MIC*    AMPICILLIN <=2 SENSITIVE Sensitive     LEVOFLOXACIN 1 SENSITIVE Sensitive     NITROFURANTOIN <=16 SENSITIVE Sensitive     VANCOMYCIN 2 SENSITIVE Sensitive     * >=100,000 COLONIES/mL ENTEROCOCCUS FAECALIS  Culture, blood (routine x 2)     Status: None (Preliminary result)   Collection Time: 03/17/18 11:48 AM  Result Value Ref Range Status   Specimen Description BLOOD RIGHT ANTECUBITAL  Final   Special Requests   Final    BOTTLES DRAWN AEROBIC AND ANAEROBIC Blood Culture adequate volume   Culture   Final    NO GROWTH 1 DAY Performed at [facility], [place] [address] [place], [place] [address]    Report Status PENDING  Incomplete  Aerobic Culture (superficial specimen)     Status: None (Preliminary result)   Collection Time: 03/17/18 11:51 AM  Result Value Ref Range  Status   Specimen Description WOUND LEFT FOOT  Final   Special Requests PENDING  Incomplete   Gram Stain   Final    FEW WBC PRESENT,BOTH PMN AND MONONUCLEAR ABUNDANT GRAM POSITIVE COCCI ABUNDANT GRAM NEGATIVE RODS Performed at [facility], [place] [address] [place], [place] [address]    Culture   Final    ABUNDANT UNIDENTIFIED ORGANISM ABUNDANT STREPTOCOCCUS GROUP C    Report Status PENDING  Incomplete  Culture, blood (routine x 2)     Status: None (Preliminary result)   Collection Time: 03/17/18 11:55 AM  Result Value Ref Range Status   Specimen Description BLOOD LEFT ANTECUBITAL  Final   Special Requests   Final    BOTTLES DRAWN AEROBIC AND ANAEROBIC Blood Culture adequate volume    Culture   Final    NO GROWTH 1 DAY Performed at [facility], [place] [address] [place], [place] [address]    Report Status PENDING  Incomplete  Anaerobic culture     Status: None (Preliminary result)   Collection Time: 03/17/18  4:15 PM  Result Value Ref Range Status   Specimen Description WOUND LEFT FOOT  Final   Special Requests   Final    NONE Performed at [facility], [place] [address], [place], [place] [address]    Gram Stain PENDING  Incomplete   Culture   Final    NO ANAEROBES ISOLATED; CULTURE IN PROGRESS FOR 5 DAYS   Report Status PENDING  Incomplete  Aerobic/Anaerobic Culture (surgical/deep wound)     Status: None (Preliminary result)   Collection Time: 03/17/18  8:28 PM  Result Value Ref Range Status   Specimen Description ABSCESS LEFT FOOT  Final   Special Requests   Final    PATIENT ON FOLLOWING ZOSYN VANC Performed at [facility], [place] [address] [place], [place] [address]    Gram Stain   Final    MODERATE WBC PRESENT, PREDOMINANTLY PMN MODERATE GRAM POSITIVE COCCI FEW GRAM NEGATIVE RODS    Culture   Final    MODERATE STREPTOCOCCUS GROUP C NO ANAEROBES ISOLATED; CULTURE IN PROGRESS FOR 5 DAYS    Report Status PENDING  Incomplete    Impression/Plan:  1. Osteomyelitis - purulence noted deep and appears to have had exposed bone.  Will need prolonged treatment. With culture growth, I will stop vancomycin.  Continue pip tazo due to unidentified organism, Enterococcus.    2.  AMS - continues to improve.   3.  Leukocytosis and fever - now afebrile.  Will continue to monitor.    Dr. Baxter Flattery back tomorrow.

## 2018-03-19 NOTE — Progress Notes (Signed)
   Subjective: 2 Days Post-Op Procedure(s) (LRB): IRRIGATION AND DEBRIDEMENT FOOT (Left)  Pt awake and alert this morning No pain in the left foot today WBC has dropped  Patient reports pain as none.  Objective:   VITALS:   Vitals:   03/19/18 0700 03/19/18 0827  BP: 127/69   Pulse: (!) 105   Resp: (!) 27   Temp:  98.8 F (37.1 C)  SpO2: 99%     Left foot dressing in place nv intact distally No rashes or edema  LABS Recent Labs    03/16/18 2032 03/16/18 2039 03/17/18 0846 03/18/18 0254  HGB 13.1 13.6 11.9* 10.4*  HCT 39.4 40.0 35.5* 31.8*  WBC 25.9*  --  27.7* 19.7*  PLT 274  --  279 249    Recent Labs    03/16/18 2039 03/17/18 1148 03/18/18 0254  NA 124* 126* 135  K 4.4 3.9 3.5  BUN 32* 32* 25*  CREATININE 1.90* 1.71* 1.52*  GLUCOSE 312* 292* 103*     Assessment/Plan: 2 Days Post-Op Procedure(s) (LRB): IRRIGATION AND DEBRIDEMENT FOOT (Left) Continue IV antibiotics per ID recommendations Pt may weight bear on heel only currently Will continue to monitor his progress   Merla Riches PA-C, [facility] [employer] [facility] [address], [address], [place], [place] [address] Phone: [phone] [email or web address] Facebook  [employer]

## 2018-03-20 DIAGNOSIS — I10 Essential (primary) hypertension: Secondary | ICD-10-CM

## 2018-03-20 DIAGNOSIS — R000 Tachycardia, unspecified: Secondary | ICD-10-CM

## 2018-03-20 DIAGNOSIS — I4891 Unspecified atrial fibrillation: Secondary | ICD-10-CM

## 2018-03-20 DIAGNOSIS — A419 Sepsis, unspecified organism: Secondary | ICD-10-CM

## 2018-03-20 DIAGNOSIS — E119 Type 2 diabetes mellitus without complications: Secondary | ICD-10-CM

## 2018-03-20 DIAGNOSIS — I480 Paroxysmal atrial fibrillation: Secondary | ICD-10-CM

## 2018-03-20 DIAGNOSIS — M86272 Subacute osteomyelitis, left ankle and foot: Secondary | ICD-10-CM

## 2018-03-20 DIAGNOSIS — M869 Osteomyelitis, unspecified: Secondary | ICD-10-CM

## 2018-03-20 HISTORY — DX: Type 2 diabetes mellitus without complications: E11.9

## 2018-03-20 HISTORY — DX: Essential (primary) hypertension: I10

## 2018-03-20 HISTORY — DX: Tachycardia, unspecified: R00.0

## 2018-03-20 HISTORY — DX: Unspecified atrial fibrillation: I48.91

## 2018-03-20 HISTORY — DX: Sepsis, unspecified organism: A41.9

## 2018-03-20 LAB — GLUCOSE, CAPILLARY
Glucose-Capillary: 139 mg/dL — ABNORMAL HIGH (ref 65–99)
Glucose-Capillary: 132 mg/dL — ABNORMAL HIGH (ref 65–99)
Glucose-Capillary: 236 mg/dL — ABNORMAL HIGH (ref 65–99)
Glucose-Capillary: 136 mg/dL — ABNORMAL HIGH (ref 65–99)
Glucose-Capillary: 173 mg/dL — ABNORMAL HIGH (ref 65–99)

## 2018-03-20 LAB — AEROBIC CULTURE W GRAM STAIN (SUPERFICIAL SPECIMEN)

## 2018-03-20 MED ORDER — OXYCODONE HCL 5 MG/5ML PO SOLN: 5.0000 mg | Freq: Once | ORAL | Status: DC | PRN

## 2018-03-20 MED ORDER — METFORMIN HCL 500 MG PO TABS
1000.0000 mg | ORAL_TABLET | Freq: Two times a day (BID) | ORAL | Status: DC
  Administered 2018-03-20 – 2018-03-23 (×6): 1000 mg via ORAL
  Filled 2018-03-20 (×6): qty 2

## 2018-03-20 MED ORDER — POTASSIUM CHLORIDE CRYS ER 20 MEQ PO TBCR
40.0000 meq | EXTENDED_RELEASE_TABLET | Freq: Once | ORAL | Status: AC
  Administered 2018-03-20: 40 meq via ORAL
  Filled 2018-03-20: qty 2

## 2018-03-20 MED ORDER — OXYCODONE HCL 5 MG PO TABS: 5.0000 mg | ORAL_TABLET | Freq: Once | ORAL | Status: DC | PRN

## 2018-03-20 MED ORDER — SODIUM CHLORIDE 0.9 % IV SOLN
1.0000 g | INTRAVENOUS | Status: DC
  Filled 2018-03-20: qty 10

## 2018-03-20 MED ORDER — FENTANYL CITRATE (PF) 100 MCG/2ML IJ SOLN: 25.0000 ug | INTRAMUSCULAR | Status: DC | PRN

## 2018-03-20 MED ORDER — APIXABAN 5 MG PO TABS
5.0000 mg | ORAL_TABLET | Freq: Two times a day (BID) | ORAL | Status: DC
  Administered 2018-03-20 – 2018-03-23 (×8): 5 mg via ORAL
  Filled 2018-03-20 (×9): qty 1

## 2018-03-20 MED ORDER — ROSUVASTATIN CALCIUM 5 MG PO TABS
5.0000 mg | ORAL_TABLET | Freq: Every day | ORAL | Status: DC
  Administered 2018-03-20 – 2018-03-26 (×7): 5 mg via ORAL
  Filled 2018-03-20 (×7): qty 1

## 2018-03-20 MED ORDER — ONDANSETRON HCL 4 MG/2ML IJ SOLN: 4.0000 mg | Freq: Once | INTRAMUSCULAR | Status: DC | PRN

## 2018-03-20 MED ORDER — SODIUM CHLORIDE 0.9 % IV SOLN
2.0000 g | INTRAVENOUS | Status: DC
  Administered 2018-03-21 – 2018-03-27 (×7): 2 g via INTRAVENOUS
  Filled 2018-03-20 (×7): qty 20

## 2018-03-20 NOTE — Discharge Instructions (Signed)

## 2018-03-20 NOTE — Progress Notes (Addendum)
STROKE TEAM PROGRESS NOTE   INTERVAL HISTORY His RN is at the bedside.  Remains tachycardic. L foot pain. Neuro stable without change. MRI neg for stroke. Medically remains unstable. Will get IM consult (CCM and card have signed off, ortho and ID following).The patient went into new onset atrial fibrillation this morning on the monitor and cardiology is recommending eliquis  Vitals:   03/20/18 0700 03/20/18 0800 03/20/18 0900 03/20/18 1000  BP: 124/84 103/63 (!) 133/115 130/87  Pulse: (!) 109 100 (!) 140 74  Resp: (!) 24 (!) 23 (!) 29 (!) 25  Temp:  99 F (37.2 C)    TempSrc:  Axillary    SpO2: 96% 93% 96% 99%  Weight:      Height:        CBC:  Recent Labs  Lab 03/16/18 2032  03/17/18 0846 03/18/18 0254  WBC 25.9*  --  27.7* 19.7*  NEUTROABS 23.8*  --  25.0*  --   HGB 13.1   < > 11.9* 10.4*  HCT 39.4   < > 35.5* 31.8*  MCV 87.9  --  87.2 89.6  PLT 274  --  279 249   < > = values in this interval not displayed.    Basic Metabolic Panel:  Recent Labs  Lab 03/17/18 1148 03/18/18 0254 03/19/18 1010  NA 126* 135 132*  K 3.9 3.5 3.3*  CL 89* 101 101  CO2 24 22 22   GLUCOSE 292* 103* 230*  BUN 32* 25* 23*  CREATININE 1.71* 1.52* 1.50*  CALCIUM 8.2* 7.2* 7.4*  MG 2.0 2.0 1.9  PHOS 3.2 3.2  --    Lipid Panel:     Component Value Date/Time   CHOL 75 03/17/2018 0339   TRIG 114 03/17/2018 0339   HDL 13 (L) 03/17/2018 0339   CHOLHDL 5.8 03/17/2018 0339   VLDL 23 03/17/2018 0339   LDLCALC 39 03/17/2018 0339   HgbA1c:  Lab Results  Component Value Date   HGBA1C 7.6 (H) 03/17/2018   Urine Drug Screen:     Component Value Date/Time   LABOPIA NONE DETECTED 03/16/2018 2125   COCAINSCRNUR NONE DETECTED 03/16/2018 2125   LABBENZ NONE DETECTED 03/16/2018 2125   AMPHETMU NONE DETECTED 03/16/2018 2125   THCU NONE DETECTED 03/16/2018 2125   LABBARB (A) 03/16/2018 2125    Result not available. Reagent lot number recalled by manufacturer.    Alcohol Level      Component Value Date/Time   ETH <10 03/16/2018 2032    IMAGING Ct Head Code Stroke Wo Contrast 03/16/2018 2051 1. Negative for age. No acute cortically based infarct or intracranial hemorrhage is identified. 2. ASPECTS is 10. 3. Chronic maxillary sinusitis.   Ct Angio Head W Or Wo Contrast Ct Angio Neck W Or Wo Contrast 03/16/2018 2125 1. Negative for large vessel occlusion. 2. Positive for atherosclerosis in the head and neck, and Aortic Atherosclerosis (ICD10-I70.0). 3. 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 No hemodynamically significant carotid or right vertebral stenosis. 4.  Emphysema   Ct Head Wo Contrast 03/17/2018 1751 Negative non-contrast CT HEAD for age.   Mr Brain Wo Contrast 03/18/2018 1450 1. No acute intracranial abnormality. 2. Mild chronic small vessel ischemic disease.   2D Echocardiogram  03/17/2018  - Left ventricle: The cavity size was normal. Systolic function was normal. The estimated ejection fraction was in the range of 60% to 65%. Wall motion was normal; there were no regional wall motion  abnormalities. There was no evidence of elevated ventricular filling pressure by Doppler parameters.  - Left atrium: The atrium was moderately dilated. Impressions:   No cardiac source of emboli was indentified.   Dg Chest Port 1 View 03/17/2018 1235 No active disease.   Dg Foot 2 Views Left 03/17/2018 1236 Chronic changes without acute abnormality.    PHYSICAL EXAM per Dr. Leonie Man HEENT: [place]/AT Lungs: Respirations unlabored Ext: LLE has  Bandage from surgical debridement from 03/17/18 Neurologic Examination: Mental Status: Alert, can answer his name, but is confused and gives incorrect answers to rest of questions. He cannot complete a sentence without becoming sob. Speech is dysarthric. He poorly attends and has difficulty following commands.  Cranial Nerves: II:  Visual fields intact without extinction to DSS. PERRL.   III,IV, VI: EOMI, PERRL. No ptosis.  V,VII: Subtle left facial droop. Facial temp sensation equal bilaterally VIII: hearing intact to voice IX,X: Palate rises symmetrically XI: Head at midline XII: midline tongue extension  Motor: RUE: 5/5 LUE: 4+/5 no.drift present. minimal weakness of left grip and intrinsic hand muscles  RLE 5/5 LLE: 4+/5 Sensory: Decreased sensation in left foot. Positive extinction to FT RUE and RLE.  Deep Tendon Reflexes:  1+ bilateral brachioradialis and biceps 1+ patellae bilaterally 0 achilles bilaterally Plantars: Right: Mute             Left: tonically upgoing Cerebellar: Prominent ataxia with FNF on left. No definite ataxia with H-S bilaterally. Prominent action tremor bilateral upper ext.  Gait: Deferred due to acuity of presentation   ASSESSMENT/PLAN [name] is a 77 y.o. male with history of HTN and DB presenting with L sided weakness, L facial droop and dysphagia.   Stroke-like episode s/p IV tPA  Code Stroke CT head No acute stroke. Chronic maxillary sinusitis. ASPECTS 10.     CTA head & neck no LVO. Atherosclerosis. 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 Emphysema.  CT head 24h after tPA negative  MRI  No acute stroke. Small vessel disease.    2D Echo  EF 60-65%. No source of embolus   LDL 39  HgbA1c 7.6  SCDs for VTE prophylaxis  aspirin 81 mg daily prior to admission, now on aspirin 81 mg daily. Continue aspirin 81 mg daily at d/c  Therapy recommendations:  HH OT, HH PT, 3N1, w/c, w/c cusion  Disposition:  pending   Hypertension  Home meds:  Metoprolol xl 100 daily  Now on: metoprolol xl 100 daily  Stable . BP goal normotensive  Hyperlipidemia  Home meds:  crestor 10, now on crestor 20  No indication for increased dose. Will decrease home dose. Recommend recheck in 4-6 weeks  LDL 39, goal < 70  Continue statin at discharge  Diabetes type II  Home meds: invokana 100 daily, amaryl 4mg  am, metformin  500 bid, januvia 100 daily  Now on:  SSI  HgbA1c 7.6, goal < 7.0  Uncontrolled  Glucoses variable:  [id number]  Resume metformin  Other Stroke Risk Factors  Advanced age  Osteomyelitis, L foot wound  S/p irrigation and debridement 6/20-6/21  Purulent drainage  Cx growth  On Rocephin, plan long-term treatment.  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 Defer to IM for appropriate placement.  Check labs in am  Hospital day # Mendes, MSN, APRN, ANVP-BC, AGPCNP-BC Advanced Practice Stroke Nurse [place] for Schedule & Pager information 03/20/2018 11:28 AM  I have personally examined this patient, reviewed notes, independently viewed imaging studies, participated in medical decision making and plan of care.ROS completed by me personally and pertinent positives fully documented  I have made any additions or clarifications directly to the above note. Agree with note above.  He continues to do well neurologically and is being treated for his infected wound in the left foot and sepsis. He has developed new-onset A. Fib and will need to be started on anticoagulation with eliquis. Plan internal medicine consult to help with medical management and transfer to neurology floor bed with telemetry monitoring when bed available. Discussed with patient and his wife at the bedside and answered questions.This patient is critically ill and at significant risk  of neurological worsening, death and care requires constant monitoring of vital signs, hemodynamics,respiratory and cardiac monitoring, extensive review of multiple databases, frequent neurological assessment, discussion with family, other specialists and medical decision making of high complexity.I have made any additions or clarifications directly to the above note.This critical care time does not reflect procedure time, or teaching time or supervisory time of PA/NP/Med Resident etc but could involve care discussion time.  I spent 30 minutes of neurocritical care time  in the care of  this patient.      Antony Contras, MD Medical Director [facility] Pager: [phone] 03/20/2018 3:01 PM  To contact Stroke Continuity provider, please refer to [email or web address] After hours, contact General Neurology

## 2018-03-20 NOTE — Progress Notes (Signed)
Rehab Admissions Coordinator Note:  Patient was screened by Cleatrice Burke for appropriateness for an Inpatient Acute Rehab Consult per PT recommendation. OT recommends HH. Noted MRI negative. At this time, we are recommending Inpatient Rehab consult if pt would like to be considered for an inpt rehab. Please advise.  Cleatrice Burke 03/20/2018, 4:19 PM  I can be reached at [phone].

## 2018-03-20 NOTE — Progress Notes (Signed)
Inpatient Diabetes Program Recommendations  AACE/ADA: New Consensus Statement on Inpatient Glycemic Control (2015)  Target Ranges:  Prepandial:   less than 140 mg/dL      Peak postprandial:   less than 180 mg/dL (1-2 hours)      Critically ill patients:  140 - 180 mg/dL   Lab Results  Component Value Date   GLUCAP 236 (H) 03/20/2018   HGBA1C 7.6 (H) 03/17/2018    Review of Glycemic ControlResults for DICKEY, CAAMANO (MRN [id number]) as of 03/20/2018 15:28  Ref. Range 03/19/2018 16:09 03/19/2018 18:43 03/19/2018 19:05 03/19/2018 23:14 03/20/2018 03:24 03/20/2018 08:36 03/20/2018 12:24  Glucose-Capillary Latest Ref Range: 65 - 99 mg/dL 265 (H) 171 (H) 188 (H) 230 (H) 139 (H) 132 (H) 236 (H)    Diabetes history: Type 2 DM  Outpatient Diabetes medications:  Amaryl 2 mg with breakfast, Metformin 1000 mg bid, Januvia 100 mg daily Current orders for Inpatient glycemic control:  Novolog 3-6-9 q 4 hours Metformin 1000 mg bid Inpatient Diabetes Program Recommendations:    Please consider d/c of ICU glycemic control order set since patient is not eating.  Consider Novolog moderate tid with meals and HS.  Also consider adding Lantus 15 units daily while in the hospital.  A1C indicates fairly good control of DM prior to admit, therefore should be able to resume oral DM medications at d/c.   Thanks,  Adah Perl, RN, BC-ADM Inpatient Diabetes Coordinator Pager [phone] (8a-5p)

## 2018-03-20 NOTE — Consult Note (Addendum)
Medical Consultation   Kevin [name]  QAS:[id number]  DOB: 1940/11/06  DOA: 03/16/2018  PCP: [name], MD    Requesting physician: Dr. Leonie Man, Neurology Reason for consultation: To help in the management of primary issues     History of Present Illness: Kevin [name] is an 77 y.o. male with a history of hypertension, diabetes, who was admitted on 03/17/2018 with acute left sided weakness and a aphasia likely due to acute right hemispheric stroke, treated with IV TPA, with significant improvement of his symptoms.  His hospital course was complicated by an sepsis due to infected left diabetic ulce, osteomyelitis requiring orthopedic involvement for debridement surgery, and ID consultation for antibiotics, and a new onset of tachycardia, requiring cardiology evaluation (see below).  Patient is overall feeling better although asymptomatic tachycardia has been noted,  He denies any fevers, chills, night sweats, vision changes, or mucositis.  He denies any dysphagia.  Denies any respiratory complaints. Denies any chest pain or palpitations.  He is unaware of his tachycardic state, and denies anxiety, or receiving albuterol rather nebulizer treatments, which could have exacerbated this tachycardia.  He is tolerating p.o.'s well.   Denies nausea, heartburn or change in bowel habits. Denies abdominal pain.  Denies any dysuria.  Denies any bleeding issues such as epistaxis, hematemesis, hematuria or hematochezia. Ambulating without difficulty but only with assistance.  His left-sided weakness is much improved.     Review of Systems:  He is left foot wound is healing well now, without any odor, or any other bruising.  He reports that he is was preceded by a callus in the area, which became infected, suspecting that this may be related to an injection of steroids received as an outpatient for acute onset of left hearing loss preceding the stroke symptom. No confusion is  reported now, but he was at the time of sepsis diagnosis .  As per HPI otherwise all other systems reviewed and are negative    Past Medical History: Past Medical History:  Diagnosis Date  . Diabetes mellitus without complication ([place])   . Hypertension     Past Surgical History: Past Surgical History:  Procedure Laterality Date  . I&D EXTREMITY Left 03/17/2018   Procedure: IRRIGATION AND DEBRIDEMENT FOOT;  Surgeon: [name], MD;  Location: [facility];  Service: Orthopedics;  Laterality: Left;  . WRIST SURGERY       Allergies:   Allergies  Allergen Reactions  . Sulfa Antibiotics Rash     Social History: Social History   Socioeconomic History  . Marital status: Married    Spouse name: Not on file  . Number of children: Not on file  . Years of education: Not on file  . Highest education level: Not on file  Occupational History  . Not on file  Social Needs  . Financial resource strain: Not on file  . Food insecurity:    Worry: Not on file    Inability: Not on file  . Transportation needs:    Medical: Not on file    Non-medical: Not on file  Tobacco Use  . Smoking status: Never Smoker  . Smokeless tobacco: Never Used  Substance and Sexual Activity  . Alcohol use: Yes  . Drug use: Never  . Sexual activity: Not on file  Lifestyle  . Physical activity:    Days per week: Not on file    Minutes per session:  Not on file  . Stress: Not on file  Relationships  . Social connections:    Talks on phone: Not on file    Gets together: Not on file    Attends religious service: Not on file    Active member of club or organization: Not on file    Attends meetings of clubs or organizations: Not on file    Relationship status: Not on file  . Intimate partner violence:    Fear of current or ex partner: Not on file    Emotionally abused: Not on file    Physically abused: Not on file    Forced sexual activity: Not on file  Other Topics Concern  . Not on file    Social History Narrative  . Not on file       Family History: Family History  Problem Relation Age of Onset  . Hypertension Father     Family history reviewed and not pertinent    Physical Exam: Vitals:   03/20/18 0700 03/20/18 0800 03/20/18 0900 03/20/18 1000  BP: 124/84 103/63 (!) 133/115 130/87  Pulse: (!) 109 100 (!) 140 74  Resp: (!) 24 (!) 23 (!) 29 (!) 25  Temp:  99 F (37.2 C)    TempSrc:  Axillary    SpO2: 96% 93% 96% 99%  Weight:      Height:        Constitutional: Appears calm,  alert and awake, oriented x3, not in any acute distress. Eyes: PERLA, EOMI, irises appear normal, anicteric sclera,  ENMT: external ears and nose appear normal, normal hearing  Lips appears normal, oropharynx mucosa, tongue, posterior pharynx appear normal there is a slight left facial droop Neck: neck appears normal, no masses, normal ROM, no thyromegaly, no JVD  CVS: Tachycardic, intermittent ectopic beats  no murmur rubs or gallops, no LE edema, normal pedal pulses  Respiratory: clear to auscultation bilaterally, no wheezing, rales or rhonchi. Respiratory effort normal. No accessory muscle use.  Abdomen: soft nontender, nondistended, normal bowel sounds, no hepatosplenomegaly, no hernias  Musculoskeletal: no cyanosis, clubbing or edema noted bilaterally. Joint/bones/muscle exam, no contractures or atrophy.  Strength slightly weaker on the left side compared to the right Neuro: Cranial nerves II-XII essentially intact exception of mild left facial droop, strength 4 out of 5 on the left side, not tested Psych: judgement and insight appear normal, stable mood and affect, mental status Skin: no rashes  no induration or nodules.  On the left foot, there is a debrided lesion, about 3 cm, without odor, or purulent discharge. Data reviewed:  I have personally reviewed following labs and imaging studies  Labs:  CBC: Recent Labs  Lab 03/16/18 2032 03/16/18 2039 03/17/18 0846 03/18/18 0254   WBC 25.9*  --  27.7* 19.7*  NEUTROABS 23.8*  --  25.0*  --   HGB 13.1 13.6 11.9* 10.4*  HCT 39.4 40.0 35.5* 31.8*  MCV 87.9  --  87.2 89.6  PLT 274  --  279 751    Basic Metabolic Panel: Recent Labs  Lab 03/16/18 2032 03/16/18 2039 03/17/18 1148 03/18/18 0254 03/19/18 1010  NA 126* 124* 126* 135 132*  K 4.7 4.4 3.9 3.5 3.3*  CL 90* 90* 89* 101 101  CO2 18*  --  24 22 22   GLUCOSE 304* 312* 292* 103* 230*  BUN 33* 32* 32* 25* 23*  CREATININE 2.17* 1.90* 1.71* 1.52* 1.50*  CALCIUM 8.3*  --  8.2* 7.2* 7.4*  MG  --   --  2.0 2.0 1.9  PHOS  --   --  3.2 3.2  --    GFR Estimated Creatinine Clearance: 53.2 mL/min (A) (by C-G formula based on SCr of 1.5 mg/dL (H)). Liver Function Tests: Recent Labs  Lab 03/16/18 2032 03/17/18 1148 03/18/18 0254  AST 62*  --   --   ALT 46  --   --   ALKPHOS 117  --   --   BILITOT 1.1  --   --   PROT 6.8  --   --   ALBUMIN 2.7* 2.6* 2.0*   No results for input(s): LIPASE, AMYLASE in the last 168 hours. No results for input(s): AMMONIA in the last 168 hours. Coagulation profile Recent Labs  Lab 03/16/18 2032  INR 1.23    Cardiac Enzymes: Recent Labs  Lab 03/16/18 2336 03/17/18 1148 03/17/18 1650 03/17/18 2315  CKTOTAL 2,084*  --   --   --   CKMB 13.1*  --   --   --   TROPONINI 0.22* 0.15* 0.14* 0.11*   BNP: Invalid input(s): POCBNP CBG: Recent Labs  Lab 03/19/18 1843 03/19/18 1905 03/19/18 2314 03/20/18 0324 03/20/18 0836  GLUCAP 171* 188* 230* 139* 132*   D-Dimer No results for input(s): DDIMER in the last 72 hours. Hgb A1c No results for input(s): HGBA1C in the last 72 hours. Lipid Profile No results for input(s): CHOL, HDL, LDLCALC, TRIG, CHOLHDL, LDLDIRECT in the last 72 hours. Thyroid function studies No results for input(s): TSH, T4TOTAL, T3FREE, THYROIDAB in the last 72 hours.  Invalid input(s): FREET3 Anemia work up No results for input(s): VITAMINB12, FOLATE, FERRITIN, TIBC, IRON, RETICCTPCT in the  last 72 hours. Urinalysis    Component Value Date/Time   COLORURINE YELLOW 03/16/2018 2125   APPEARANCEUR HAZY (A) 03/16/2018 2125   LABSPEC 1.011 03/16/2018 2125   PHURINE 6.0 03/16/2018 2125   GLUCOSEU >=500 (A) 03/16/2018 2125   HGBUR LARGE (A) 03/16/2018 2125   BILIRUBINUR NEGATIVE 03/16/2018 2125   KETONESUR 5 (A) 03/16/2018 2125   PROTEINUR 30 (A) 03/16/2018 2125   NITRITE NEGATIVE 03/16/2018 2125   LEUKOCYTESUR LARGE (A) 03/16/2018 2125     Sepsis Labs Invalid input(s): PROCALCITONIN,  WBC,  LACTICIDVEN Microbiology Recent Results (from the past 240 hour(s))  MRSA PCR Screening     Status: None   Collection Time: 03/16/18 10:37 PM  Result Value Ref Range Status   MRSA by PCR NEGATIVE NEGATIVE Final    Comment:        The GeneXpert MRSA Assay (FDA approved for NASAL specimens only), is one component of a comprehensive MRSA colonization surveillance program. It is not intended to diagnose MRSA infection nor to guide or monitor treatment for MRSA infections. Performed at [facility], [place] [address], [place], [place] [address]   Urine Culture     Status: Abnormal   Collection Time: 03/17/18  8:30 AM  Result Value Ref Range Status   Specimen Description URINE, CLEAN CATCH  Final   Special Requests   Final    NONE Performed at [facility], [place] [address] [place], [place] [address]    Culture >=100,000 COLONIES/mL ENTEROCOCCUS FAECALIS (A)  Final   Report Status 03/19/2018 FINAL  Final   Organism ID, Bacteria ENTEROCOCCUS FAECALIS (A)  Final      Susceptibility   Enterococcus faecalis - MIC*    AMPICILLIN <=2 SENSITIVE Sensitive     LEVOFLOXACIN 1 SENSITIVE Sensitive     NITROFURANTOIN <=16 SENSITIVE  Sensitive     VANCOMYCIN 2 SENSITIVE Sensitive     * >=100,000 COLONIES/mL ENTEROCOCCUS FAECALIS  Culture, blood (routine x 2)     Status: None (Preliminary result)   Collection Time: 03/17/18 11:48 AM  Result Value Ref Range Status   Specimen  Description BLOOD RIGHT ANTECUBITAL  Final   Special Requests   Final    BOTTLES DRAWN AEROBIC AND ANAEROBIC Blood Culture adequate volume   Culture   Final    NO GROWTH 2 DAYS Performed at [facility], [place] [address] [place], [place] [address]    Report Status PENDING  Incomplete  Aerobic Culture (superficial specimen)     Status: None   Collection Time: 03/17/18 11:51 AM  Result Value Ref Range Status   Specimen Description WOUND LEFT FOOT  Final   Special Requests NONE  Final   Gram Stain   Final    FEW WBC PRESENT,BOTH PMN AND MONONUCLEAR ABUNDANT GRAM POSITIVE COCCI ABUNDANT GRAM NEGATIVE RODS Performed at [facility], [address], [place], [place] [address]    Culture   Final    ABUNDANT STAPHYLOCOCCUS SPECIES (COAGULASE NEGATIVE) ABUNDANT STREPTOCOCCUS GROUP C    Report Status 03/20/2018 FINAL  Final   Organism ID, Bacteria STAPHYLOCOCCUS SPECIES (COAGULASE NEGATIVE)  Final      Susceptibility   Staphylococcus species (coagulase negative) - MIC*    CIPROFLOXACIN <=0.5 SENSITIVE Sensitive     ERYTHROMYCIN <=0.25 SENSITIVE Sensitive     GENTAMICIN 8 INTERMEDIATE Intermediate     OXACILLIN SENSITIVE Sensitive     TETRACYCLINE <=1 SENSITIVE Sensitive     VANCOMYCIN 1 SENSITIVE Sensitive     TRIMETH/SULFA <=10 SENSITIVE Sensitive     CLINDAMYCIN <=0.25 SENSITIVE Sensitive     RIFAMPIN <=0.5 SENSITIVE Sensitive     Inducible Clindamycin NEGATIVE Sensitive     * ABUNDANT STAPHYLOCOCCUS SPECIES (COAGULASE NEGATIVE)  Culture, blood (routine x 2)     Status: None (Preliminary result)   Collection Time: 03/17/18 11:55 AM  Result Value Ref Range Status   Specimen Description BLOOD LEFT ANTECUBITAL  Final   Special Requests   Final    BOTTLES DRAWN AEROBIC AND ANAEROBIC Blood Culture adequate volume   Culture   Final    NO GROWTH 2 DAYS Performed at [facility], [address] [place], [place] [address]    Report Status PENDING  Incomplete    Anaerobic culture     Status: None (Preliminary result)   Collection Time: 03/17/18  4:15 PM  Result Value Ref Range Status   Specimen Description WOUND LEFT FOOT  Final   Special Requests NONE  Final   Gram Stain   Final    NO ANAEROBES ISOLATED; CULTURE IN PROGRESS FOR 5 DAYS Performed at [facility], [address], [place], [place] [address]    Culture   Final    NO ANAEROBES ISOLATED; CULTURE IN PROGRESS FOR 5 DAYS   Report Status PENDING  Incomplete  Aerobic/Anaerobic Culture (surgical/deep wound)     Status: None (Preliminary result)   Collection Time: 03/17/18  8:28 PM  Result Value Ref Range Status   Specimen Description ABSCESS LEFT FOOT  Final   Special Requests   Final    PATIENT ON FOLLOWING ZOSYN VANC Performed at [facility], [place] [address] [place], [place] [address]    Gram Stain   Final    MODERATE WBC PRESENT, PREDOMINANTLY PMN MODERATE GRAM POSITIVE COCCI  FEW GRAM NEGATIVE RODS    Culture   Final    MODERATE STREPTOCOCCUS GROUP C NO ANAEROBES ISOLATED; CULTURE IN PROGRESS FOR 5 DAYS    Report Status PENDING  Incomplete       Inpatient Medications:   Scheduled Meds: . aspirin EC  81 mg Oral Daily  . chlorhexidine  60 mL Topical Once  . docusate sodium  100 mg Oral BID  . famotidine  20 mg Oral BID  . FLUoxetine  20 mg Oral Daily  . insulin aspart  3-9 Units Subcutaneous Q4H  . labetalol  20 mg Intravenous Once  . mesalamine  0.75 g Oral BID  . metFORMIN  1,000 mg Oral BID WC  . metoprolol succinate  100 mg Oral Daily  . povidone-iodine  2 application Topical Once  . rosuvastatin  5 mg Oral q1800   Continuous Infusions: . sodium chloride Stopped (03/18/18 1556)  . [START ON 03/21/2018] cefTRIAXone (ROCEPHIN)  IV    . clevidipine       Radiological Exams on Admission: Mr Brain Wo Contrast  Result Date: 03/18/2018 CLINICAL DATA:  Expressive aphasia. Left arm and leg weakness. Status post tPA on 03/16/2018. EXAM: MRI HEAD  WITHOUT CONTRAST TECHNIQUE: Multiplanar, multiecho pulse sequences of the brain and surrounding structures were obtained without intravenous contrast. COMPARISON:  03/17/2018 FINDINGS: Brain: There is no evidence of acute infarct, intracranial hemorrhage, mass, midline shift, or extra-axial fluid collection. There is mild cerebral atrophy. Scattered subcortical and deep cerebral white matter T2 hyperintensities are nonspecific but compatible with mild chronic small vessel ischemic disease. 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 Vascular: Major intracranial vascular flow voids are preserved. Skull and upper cervical spine: Unremarkable bone marrow signal. Sinuses/Orbits: Unremarkable orbits. Chronic left maxillary sinusitis. No significant mastoid fluid. Other: None. IMPRESSION: 1. No acute intracranial abnormality. 2. Mild chronic small vessel ischemic disease. Electronically Signed   By: Logan Bores M.D.   On: 03/18/2018 14:50    Impression/Recommendations Active Problems:   Stroke (cerebrum) (HCC)   Essential hypertension   Tachycardia   Sepsis ([place])   Diabetes ([place])   Osteomyelitis ([place])    Presumed acute right hemispheric stroke, treated with IV TPA, with significant improvement of his symptoms.  He had presented with with acute left sided weakness and a aphasia, which symptoms are now improving.  His last MRI of the brain is negative for stroke. LDL is 39. CTA of the head and neck shows  intracranial atherosclerotic disease, with a left vertebral severe stenotic, no stenosis in the carotids. Echocardiogram with normal EF and no evidence of large vegetations, or evidence of any severe valvular abnormalities.  No cardiac source of emboli was identified. Appreciate neurology involvement in follow-up. Continue statins Continue PT and OT Once the patient is discharged from ICU, will be happy to assume care.  Sepsis likely due to diabetic left foot wound, osteomyelitis requiring  debridement on 03/17/2018 by orthopedics.  Initial procalcitonin was 17.79 . ID has been involved in  the treatment Cultures positive for  Coag Neg Staph and Group C strep, thus, switching antibiotics today from Vanco and Pip-tazo, to Ceftriaxone for a total of 6 weeks of antibiotics  ( as of 6/21)  Initial white count was 25.9, with low-grade fever, the patient is now currently afebrile, and his leukocytosis has improved to 19.9  Continue IV antibiotics as above  Continue left foot wound care, PT and OT Other recommendations as per infectious diseases, as well as orthopedics  appreciate involvement  Tachycardia, in the setting of sepsis.  Cardiology has evaluated the patient, as EKG showed sinus tachycardia, with frequent PACs, occasional PVCs, and occasional aberrantly conducted QRS complexes. Initially his Tn was 0.22 in the setting of sepsis, last Tn at 0.11 on 6/21  Echo showed normal EF 60 to 65%, with negative for vegetations.  Today, the patient has received already 2 doses of IV metoprolol, has taking his oral metoprolol this morning for rate up to 140, that he continues to have intermittent episodes of tachycardia despite these meds.  Patient denies any anxiety, he has minimal pain in the foot, he is hydrated.  He denies any chest pain or palpitations.   Recommend to obtain follow-up cardiology evaluation, for recommendations regarding med adjustment.  Hypertension BP 130/87   Controlled Continue home anti-hypertensive medications    Type II Diabetes Current blood sugar level is 230 Lab Results  Component Value Date   HGBA1C 7.6 (H) 03/17/2018  Appreciate diabetes coordinator involvement Continue sliding scale insulin.  Oral medications on hold Carb modified diet  History of ulcerative colitis, no acute issues noted. Continue mesalamine  Acute Kidney Injury, unknown etiology versus undiagnosed chronic disease.  Initial creatinine during presentation was 2.17, currently at 1.5 after IV  fluids, antibiotics, and increased oral intake Lab Results  Component Value Date   CREATININE 1.50 (H) 03/19/2018   CREATININE 1.52 (H) 03/18/2018   CREATININE 1.71 (H) 03/17/2018  BMET in am  If continues to worsen, the patient may benefit from evaluation by renal.  Depression Continue home Fluoxetine     Thank you for this consultation.  Our [facility] hospitalist team will follow the patient with you.Once patient discharged from ICU, will assume his care     Sharene Butters PA-C [facility] 03/20/2018, 11:33 AM

## 2018-03-20 NOTE — Progress Notes (Signed)
Orthopedic Tech Progress Note Patient Details:  Kevin [name] [birth year] [id number]  Ortho Devices Type of Ortho Device: Darco shoe Ortho Device/Splint Interventions: Application   Post Interventions Patient Tolerated: Well Instructions Provided: Care of device   [place] Pink 03/20/2018, 7:47 PM

## 2018-03-20 NOTE — Progress Notes (Signed)
[place] for apixaban Indication: atrial fibrillation  Labs: Recent Labs    03/17/18 1650 03/17/18 2315 03/18/18 0254 03/19/18 1010  HGB  --   --  10.4*  --   HCT  --   --  31.8*  --   PLT  --   --  249  --   CREATININE  --   --  1.52* 1.50*  TROPONINI 0.14* 0.11*  --   --     Assessment: 68 yom admitted with acute stroke, s/p TPA on 6/20, now with new afib. Pharmacy consulted to start apixaban. Aspirin d/c'd. Not on anticoagulation PTA - currently on SCDs here. Hg down to 10.4, plt wnl - last CBC from 6/22. SCr down to 1.5 on 6/23. No bleed issues documented.  Goal of Therapy:  Stroke prevention Monitor platelets by anticoagulation protocol: Yes   Plan:  Apixaban 5mg  PO BID Monitor CBC, s/sx bleeding  Elicia Lamp, PharmD, BCPS Clinical Pharmacist 03/20/2018 1:11 PM

## 2018-03-20 NOTE — Progress Notes (Signed)
Physical Therapy Treatment Patient Details Name: Kevin [name] MRN: [id number] DOB: 1941/05/09 Today's Date: 03/20/2018    History of Present Illness  Mr. Lurz is a 77 y.o.-year-old male with a left diabetic foot ulcer with abscess.  He was admitted to the neuro ICU following  stroke like symptoms 03/16/18.  He was noted to have a left plantar foot ulcer.  During the course of the day 6/21 he was decompensating and becoming septic.  The wound was foul-smelling and concern was that the source of his sepsis was the infected foot.  Underwent I&D left foot ulcer 03/17/18.   Stroke symptoms seem to have resolved.      PT Comments    Patient progressing to in room ambulation with assist, but easily fatigued and with balance difficulties despite heel weight bearing only.  Now on Eliquis and high fall risk as well as decreased endurance.  Feel he can benefit from CIR level rehab prior to d/c home.  PT to follow acutely.   Follow Up Recommendations  Supervision/Assistance - 24 hour;CIR     Equipment Recommendations  Rolling walker with 5" wheels;3in1 (PT)    Recommendations for Other Services       Precautions / Restrictions Precautions Precautions: Fall Precaution Comments: watch HR Restrictions Weight Bearing Restrictions: No LLE Weight Bearing: Partial weight bearing LLE Partial Weight Bearing Percentage or Pounds: heel only    Mobility  Bed Mobility Overal bed mobility: Needs Assistance Bed Mobility: Supine to Sit     Supine to sit: Supervision Sit to supine: Supervision   General bed mobility comments: for safety and lines   Transfers Overall transfer level: Needs assistance Equipment used: Rolling walker (2 wheeled) Transfers: Sit to/from Stand Sit to Stand: Mod assist;+2 safety/equipment         General transfer comment: assist for balance and mod cues for heel weight bearing  Ambulation/Gait Ambulation/Gait assistance: Min assist;+2 safety/equipment Gait  Distance (Feet): 12 Feet Assistive device: Rolling walker (2 wheeled) Gait Pattern/deviations: Step-to pattern;Wide base of support;Decreased stance time - left     General Gait Details: cues for keeping L foot ahead and heel weight only, assist for balance, safety, and walker use   Stairs             Wheelchair Mobility    Modified Rankin (Stroke Patients Only) Modified Rankin (Stroke Patients Only) Pre-Morbid Rankin Score: No symptoms Modified Rankin: Moderately severe disability     Balance Overall balance assessment: Needs assistance Sitting-balance support: No upper extremity supported;Feet supported Sitting balance-Leahy Scale: Good     Standing balance support: Bilateral upper extremity supported;During functional activity Standing balance-Leahy Scale: Poor                              Cognition Arousal/Alertness: Awake/alert Behavior During Therapy: WFL for tasks assessed/performed Overall Cognitive Status: Impaired/Different from baseline                         Following Commands: Follows multi-step commands with increased time     Problem Solving: Slow processing;Difficulty sequencing General Comments: Pt appears to have improved cognition this session, though not able to assess during functional tasks beyond sitting EOB this session. Pt intermittently requiring mulitmodal cues for following one step commands       Exercises General Exercises - Upper Extremity Shoulder Flexion: AROM;AAROM;10 reps;Supine;Left(0-90*) Shoulder Extension: AROM;AAROM;10 reps;Supine;Left Shoulder ABduction: AROM;AAROM;10 reps;Supine;Left Shoulder ADduction: AROM;AAROM;10  reps;Left;Supine Shoulder Horizontal ABduction: AROM;AAROM;10 reps;Left;Supine Shoulder Horizontal ADduction: AROM;AAROM;10 reps;Left;Supine General Exercises - Lower Extremity Ankle Circles/Pumps: AROM;20 reps;Both;Seated    General Comments General comments (skin integrity,  edema, etc.): spouse present, agrees to rehab prior to d/c home; SpO2 at lowest 89% on RA with dyspnea with mobiltiy HR max 129      Pertinent Vitals/Pain Pain Assessment: No/denies pain Faces Pain Scale: Hurts little more Pain Location: L shoulder with certain movements  Pain Descriptors / Indicators: Grimacing;Sore Pain Intervention(s): Monitored during session;Repositioned    Home Living                      Prior Function            PT Goals (current goals can now be found in the care plan section) Acute Rehab PT Goals Patient Stated Goal: to get better Progress towards PT goals: Progressing toward goals    Frequency    Min 3X/week      PT Plan Discharge plan needs to be updated    Co-evaluation              AM-PAC PT "6 Clicks" Daily Activity  Outcome Measure  Difficulty turning over in bed (including adjusting bedclothes, sheets and blankets)?: None Difficulty moving from lying on back to sitting on the side of the bed? : A Little Difficulty sitting down on and standing up from a chair with arms (e.g., wheelchair, bedside commode, etc,.)?: Unable Help needed moving to and from a bed to chair (including a wheelchair)?: A Little Help needed walking in hospital room?: A Little Help needed climbing 3-5 steps with a railing? : Total 6 Click Score: 15    End of Session Equipment Utilized During Treatment: Gait belt Activity Tolerance: Patient limited by fatigue Patient left: with call bell/phone within reach;in chair;with family/visitor present   PT Visit Diagnosis: Other abnormalities of gait and mobility (R26.89);Unsteadiness on feet (R26.81);Pain Pain - Right/Left: Right Pain - part of body: Ankle and joints of foot     Time: 1407-1430 PT Time Calculation (min) (ACUTE ONLY): 23 min  Charges:  $Gait Training: 8-22 mins $Therapeutic Activity: 8-22 mins                    G CodesMagda Kiel, [place] [phone] 03/20/2018    Reginia Naas 03/20/2018, 3:19 PM

## 2018-03-20 NOTE — Progress Notes (Signed)
[facility]  Date of Admission:  03/16/2018     Total days of antibiotics 4       ASSESSMENT/PLAN  Mr. 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   Osteomyelitis - Surgical cultures now identify Coag Negative Staph and Group C Strep. Discontinue vancomycin and piperacillin-tazobactam. Start Ceftraxone. Will require at least 6 weeks of antimicrobial therapy starting from 6/21.  Therapeutic Drug Monitoring - Kidney function slightly improved with no evidence of nephrotoxicity from vancomycin. Antibiotic now changed.  Active Problems:   Stroke (cerebrum) ([place])   . aspirin EC  81 mg Oral Daily  . chlorhexidine  60 mL Topical Once  . docusate sodium  100 mg Oral BID  . famotidine  20 mg Oral BID  . FLUoxetine  20 mg Oral Daily  . insulin aspart  3-9 Units Subcutaneous Q4H  . labetalol  20 mg Intravenous Once  . mesalamine  0.75 g Oral BID  . metoprolol succinate  100 mg Oral Daily  . povidone-iodine  2 application Topical Once  . rosuvastatin  20 mg Oral q1800    SUBJECTIVE:  Afebrile overnight. Continues on Vancomycin and piperacillin-tazobactam. Surgical cultures no positive for Group C Strep and Coag Negative Staph. Continues to feel better with no new complaints over night. Pain is well controlled.   Allergies  Allergen Reactions  . Sulfa Antibiotics Rash     Review of Systems: Review of Systems  Constitutional: Negative for chills and fever.  Respiratory: Negative for cough, sputum production and shortness of breath.   Cardiovascular: Negative for chest pain and claudication.  Gastrointestinal: Negative for abdominal pain, constipation, diarrhea, nausea and vomiting.  Genitourinary: Negative for dysuria, hematuria and urgency.  Skin: Negative for rash.       OBJECTIVE: Vitals:   03/20/18 0700 03/20/18 0800 03/20/18 0900 03/20/18 1000  BP: 124/84 103/63 (!) 133/115 130/87  Pulse: (!) 109 100 (!) 140 74  Resp: (!) 24 (!) 23 (!) 29 (!) 25  Temp:  99 F (37.2 C)    TempSrc:  Axillary    SpO2: 96% 93% 96% 99%  Weight:      Height:       Body mass index is 28.45 kg/m.  Physical Exam  Constitutional: He is oriented to person, place, and time. He appears well-developed and well-nourished. No distress.  Cardiovascular: Normal rate, regular rhythm, normal heart sounds and intact distal pulses. Exam reveals no gallop and no friction rub.  No murmur heard. Pulmonary/Chest: Effort normal and breath sounds normal. No stridor. No respiratory distress. He has no wheezes. He has no rales. He exhibits no tenderness.  Neurological: He is alert and oriented to person, place, and time.  Skin: Skin is warm and dry.  Foot remains wrapped in surgical dressing that is clean, dry and intact.   Psychiatric: He has a normal mood and affect. His behavior is normal. Judgment and thought content normal.    Lab Results Lab Results  Component Value Date   WBC 19.7 (H) 03/18/2018   HGB 10.4 (L) 03/18/2018   HCT 31.8 (L) 03/18/2018   MCV 89.6 03/18/2018   PLT 249 03/18/2018    Lab Results  Component Value Date   CREATININE 1.50 (H) 03/19/2018   BUN 23 (H) 03/19/2018   NA 132 (L) 03/19/2018  K 3.3 (L) 03/19/2018   CL 101 03/19/2018   CO2 22 03/19/2018    Lab Results  Component Value Date   ALT 46 03/16/2018   AST 62 (H) 03/16/2018   ALKPHOS 117 03/16/2018   BILITOT 1.1 03/16/2018     Microbiology: Recent Results (from the past 240 hour(s))  MRSA PCR Screening     Status: None   Collection Time: 03/16/18 10:37 PM  Result Value Ref Range Status   MRSA by PCR NEGATIVE NEGATIVE Final    Comment:        The GeneXpert MRSA Assay (FDA approved for NASAL specimens only), is one component of a comprehensive MRSA colonization surveillance  program. It is not intended to diagnose MRSA infection nor to guide or monitor treatment for MRSA infections. Performed at [facility], [place] [address] [place], [place] [address]   Urine Culture     Status: Abnormal   Collection Time: 03/17/18  8:30 AM  Result Value Ref Range Status   Specimen Description URINE, CLEAN CATCH  Final   Special Requests   Final    NONE Performed at [facility], [place] [address], [place], [place] [address]    Culture >=100,000 COLONIES/mL ENTEROCOCCUS FAECALIS (A)  Final   Report Status 03/19/2018 FINAL  Final   Organism ID, Bacteria ENTEROCOCCUS FAECALIS (A)  Final      Susceptibility   Enterococcus faecalis - MIC*    AMPICILLIN <=2 SENSITIVE Sensitive     LEVOFLOXACIN 1 SENSITIVE Sensitive     NITROFURANTOIN <=16 SENSITIVE Sensitive     VANCOMYCIN 2 SENSITIVE Sensitive     * >=100,000 COLONIES/mL ENTEROCOCCUS FAECALIS  Culture, blood (routine x 2)     Status: None (Preliminary result)   Collection Time: 03/17/18 11:48 AM  Result Value Ref Range Status   Specimen Description BLOOD RIGHT ANTECUBITAL  Final   Special Requests   Final    BOTTLES DRAWN AEROBIC AND ANAEROBIC Blood Culture adequate volume   Culture   Final    NO GROWTH 2 DAYS Performed at [facility], [place] [address] [place], [place] [address]    Report Status PENDING  Incomplete  Aerobic Culture (superficial specimen)     Status: None (Preliminary result)   Collection Time: 03/17/18 11:51 AM  Result Value Ref Range Status   Specimen Description WOUND LEFT FOOT  Final   Special Requests PENDING  Incomplete   Gram Stain   Final    FEW WBC PRESENT,BOTH PMN AND MONONUCLEAR ABUNDANT GRAM POSITIVE COCCI ABUNDANT GRAM NEGATIVE RODS Performed at [facility], [place] [address], [place], [place] [address]    Culture   Final    ABUNDANT STAPHYLOCOCCUS SPECIES (COAGULASE NEGATIVE) ABUNDANT STREPTOCOCCUS GROUP C    Report Status PENDING  Incomplete   Organism ID,  Bacteria STAPHYLOCOCCUS SPECIES (COAGULASE NEGATIVE)  Final      Susceptibility   Staphylococcus species (coagulase negative) - MIC*    CIPROFLOXACIN <=0.5 SENSITIVE Sensitive     ERYTHROMYCIN <=0.25 SENSITIVE Sensitive     GENTAMICIN 8 INTERMEDIATE Intermediate     OXACILLIN SENSITIVE Sensitive     TETRACYCLINE <=1 SENSITIVE Sensitive     VANCOMYCIN 1 SENSITIVE Sensitive     TRIMETH/SULFA <=10 SENSITIVE Sensitive     CLINDAMYCIN <=0.25 SENSITIVE Sensitive     RIFAMPIN <=0.5 SENSITIVE Sensitive     Inducible Clindamycin NEGATIVE Sensitive     * ABUNDANT STAPHYLOCOCCUS SPECIES (COAGULASE NEGATIVE)  Culture, blood (routine  x 2)     Status: None (Preliminary result)   Collection Time: 03/17/18 11:55 AM  Result Value Ref Range Status   Specimen Description BLOOD LEFT ANTECUBITAL  Final   Special Requests   Final    BOTTLES DRAWN AEROBIC AND ANAEROBIC Blood Culture adequate volume   Culture   Final    NO GROWTH 2 DAYS Performed at [facility], [address], [place], [place] [address]    Report Status PENDING  Incomplete  Anaerobic culture     Status: None (Preliminary result)   Collection Time: 03/17/18  4:15 PM  Result Value Ref Range Status   Specimen Description WOUND LEFT FOOT  Final   Special Requests   Final    NONE Performed at [facility], [place] [address], [place], [place] [address]    Gram Stain PENDING  Incomplete   Culture   Final    NO ANAEROBES ISOLATED; CULTURE IN PROGRESS FOR 5 DAYS   Report Status PENDING  Incomplete  Aerobic/Anaerobic Culture (surgical/deep wound)     Status: None (Preliminary result)   Collection Time: 03/17/18  8:28 PM  Result Value Ref Range Status   Specimen Description ABSCESS LEFT FOOT  Final   Special Requests   Final    PATIENT ON FOLLOWING ZOSYN VANC Performed at [facility], [place] [address], [place],  [address]    Gram Stain   Final    MODERATE WBC PRESENT, PREDOMINANTLY PMN MODERATE GRAM POSITIVE  COCCI FEW GRAM NEGATIVE RODS    Culture   Final    MODERATE STREPTOCOCCUS GROUP C NO ANAEROBES ISOLATED; CULTURE IN PROGRESS FOR 5 DAYS    Report Status PENDING  Incomplete     Terri Piedra, NP [facility] [phone] Pager  03/20/2018  10:55 AM

## 2018-03-20 NOTE — Progress Notes (Addendum)
Occupational Therapy Treatment Patient Details Name: [name] MRN: [id number] DOB: 07-09-1941 Today's Date: 03/20/2018    History of present illness  [name] is a 77 y.o.-year-old male with a left diabetic foot ulcer with abscess.  He was admitted to the neuro ICU following  stroke like symptoms 03/16/18.  He was noted to have a left plantar foot ulcer.  During the course of the day 6/21 he was decompensating and becoming septic.  The wound was foul-smelling and concern was that the source of his sepsis was the infected foot.  Underwent I&D left foot ulcer 03/17/18.   Stroke symptoms seem to have resolved.     OT comments  Pt presents supine in bed pleasant and motivated to work with therapy. Session limited due to elevated HR (up to low 150's while seated EOB) therefore limited mobility to sitting EOB. Pt completing bed mobility and maintaining sitting EOB with overall supervision. Once returned to supine pt engaging in A/AAROM to L shoulder as pt with continued L shoulder pain. Feel POC remains appropriate at this time. Will continue to follow acutely for ADL and mobility progression.    Follow Up Recommendations  Home health OT;Supervision/Assistance - 24 hour    Equipment Recommendations  3 in 1 bedside commode          Precautions / Restrictions Precautions Precautions: Fall Restrictions Weight Bearing Restrictions: No LLE Weight Bearing: (okay to wt bear through L heel only )       Mobility Bed Mobility Overal bed mobility: Needs Assistance Bed Mobility: Supine to Sit;Sit to Supine     Supine to sit: Supervision Sit to supine: Supervision   General bed mobility comments: for safety and lines   Transfers                      Balance Overall balance assessment: Needs assistance Sitting-balance support: No upper extremity supported;Feet supported Sitting balance-Leahy Scale: Fair                                     ADL either  performed or assessed with clinical judgement   ADL Overall ADL's : Needs assistance/impaired     Grooming: Set up;Sitting;Supervision/safety                 Lower Body Dressing Details (indicate cue type and reason): assist to don L sock over ace wrap this session once sitting EOB                General ADL Comments: pt performing bed mobility and static sitting EOB with overall supervision for sitting balance; pt with fluctating and elevated HR sitting EOB noted up to low 150s. Due to increased HR returned to supine in bed, pt participating in A/AAROM to L shoulder once back in supine. Heat applied to shoulder as well                        Cognition Arousal/Alertness: Awake/alert Behavior During Therapy: WFL for tasks assessed/performed Overall Cognitive Status: Impaired/Different from baseline                         Following Commands: Follows one step commands with increased time     Problem Solving: Slow processing;Difficulty sequencing;Requires verbal cues General Comments: Pt appears to have improved cognition this session, though not able  to assess during functional tasks beyond sitting EOB this session. Pt intermittently requiring mulitmodal cues for following one step commands         Exercises General Exercises - Upper Extremity Shoulder Flexion: AROM;AAROM;10 reps;Supine;Left(0-90*) Shoulder Extension: AROM;AAROM;10 reps;Supine;Left Shoulder ABduction: AROM;AAROM;10 reps;Supine;Left Shoulder ADduction: AROM;AAROM;10 reps;Left;Supine Shoulder Horizontal ABduction: AROM;AAROM;10 reps;Left;Supine Shoulder Horizontal ADduction: AROM;AAROM;10 reps;Left;Supine   Shoulder Instructions       General Comments      Pertinent Vitals/ Pain       Pain Assessment: Faces Faces Pain Scale: Hurts little more Pain Location: L shoulder with certain movements  Pain Descriptors / Indicators: Grimacing;Sore Pain Intervention(s): Monitored during  session;Repositioned  Home Living                                          Prior Functioning/Environment              Frequency  Min 3X/week        Progress Toward Goals  OT Goals(current goals can now be found in the care plan section)  Progress towards OT goals: OT to reassess next treatment  Acute Rehab OT Goals Patient Stated Goal: to get better OT Goal Formulation: With patient Time For Goal Achievement: 04/02/18 Potential to Achieve Goals: Good  Plan Discharge plan remains appropriate    Co-evaluation                 AM-PAC PT "6 Clicks" Daily Activity     Outcome Measure   Help from another person eating meals?: None Help from another person taking care of personal grooming?: A Little Help from another person toileting, which includes using toliet, bedpan, or urinal?: A Little Help from another person bathing (including washing, rinsing, drying)?: A Lot Help from another person to put on and taking off regular upper body clothing?: A Little Help from another person to put on and taking off regular lower body clothing?: A Lot 6 Click Score: 17    End of Session    OT Visit Diagnosis: Unsteadiness on feet (R26.81);Other abnormalities of gait and mobility (R26.89);Muscle weakness (generalized) (M62.81);Pain;Other symptoms and signs involving cognitive function Pain - Right/Left: Left Pain - part of body: Ankle and joints of foot(and shoulder )   Activity Tolerance Patient tolerated treatment well;Treatment limited secondary to medical complications (Comment)(elevated HR )   Patient Left in bed;with call bell/phone within reach   Nurse Communication Mobility status;Other (comment)(elevated HR )        Time: 0349-1791 OT Time Calculation (min): 29 min  Charges: OT General Charges $OT Visit: 1 Visit OT Treatments $Self Care/Home Management : 8-22 mins $Therapeutic Activity: 8-22 mins  Lou Cal, OT Pager  [phone] 03/20/2018    Raymondo Band 03/20/2018, 1:14 PM

## 2018-03-20 NOTE — Progress Notes (Addendum)
Progress Note  Patient Name: Kevin [name] Date of Encounter: 03/20/2018  Primary Cardiologist: New to Dr. [name]  Subjective   No chest pain, SOB or palpitations.   Inpatient Medications    Scheduled Meds: . aspirin EC  81 mg Oral Daily  . chlorhexidine  60 mL Topical Once  . docusate sodium  100 mg Oral BID  . famotidine  20 mg Oral BID  . FLUoxetine  20 mg Oral Daily  . insulin aspart  3-9 Units Subcutaneous Q4H  . labetalol  20 mg Intravenous Once  . mesalamine  0.75 g Oral BID  . metoprolol succinate  100 mg Oral Daily  . povidone-iodine  2 application Topical Once  . rosuvastatin  20 mg Oral q1800   Continuous Infusions: . sodium chloride Stopped (03/18/18 1556)  . [START ON 03/21/2018] cefTRIAXone (ROCEPHIN)  IV    . clevidipine     PRN Meds: sodium chloride, [DISCONTINUED] acetaminophen **OR** [DISCONTINUED] acetaminophen (TYLENOL) oral liquid 160 mg/5 mL **OR** acetaminophen, metoCLOPramide **OR** metoCLOPramide (REGLAN) injection, metoprolol tartrate, ondansetron **OR** ondansetron (ZOFRAN) IV, senna-docusate   Vital Signs    Vitals:   03/20/18 0700 03/20/18 0800 03/20/18 0900 03/20/18 1000  BP: 124/84 103/63 (!) 133/115 130/87  Pulse: (!) 109 100 (!) 140 74  Resp: (!) 24 (!) 23 (!) 29 (!) 25  Temp:  99 F (37.2 C)    TempSrc:  Axillary    SpO2: 96% 93% 96% 99%  Weight:      Height:        Intake/Output Summary (Last 24 hours) at 03/20/2018 1109 Last data filed at 03/20/2018 0900 Gross per 24 hour  Intake 1065.43 ml  Output 2650 ml  Net -1584.57 ml   Filed Weights   03/16/18 2000 03/16/18 2113  Weight: 215 lb 9.8 oz (97.8 kg) 215 lb 9.8 oz (97.8 kg)    Telemetry    afib at rate of 110-120s- Personally Reviewed  ECG    afib at 112 bpm   Physical Exam   GEN: No acute distress.   Neck: No JVD Cardiac: IR IR tachycardiac , no murmurs, rubs, or gallops.  Respiratory: Clear to auscultation bilaterally. GI: Soft, nontender,  non-distended  MS: No edema; No deformity. Neuro:  Nonfocal  Psych: Normal affect   Labs    Chemistry Recent Labs  Lab 03/16/18 2032  03/17/18 1148 03/18/18 0254 03/19/18 1010  NA 126*   < > 126* 135 132*  K 4.7   < > 3.9 3.5 3.3*  CL 90*   < > 89* 101 101  CO2 18*  --  24 22 22   GLUCOSE 304*   < > 292* 103* 230*  BUN 33*   < > 32* 25* 23*  CREATININE 2.17*   < > 1.71* 1.52* 1.50*  CALCIUM 8.3*  --  8.2* 7.2* 7.4*  PROT 6.8  --   --   --   --   ALBUMIN 2.7*  --  2.6* 2.0*  --   AST 62*  --   --   --   --   ALT 46  --   --   --   --   ALKPHOS 117  --   --   --   --   BILITOT 1.1  --   --   --   --   GFRNONAA 28*  --  38* 43* 44*  GFRAA 33*  --  44* 50* 51*  ANIONGAP 18*  --  13 12 9    < > = values in this interval not displayed.     Hematology Recent Labs  Lab 03/16/18 2032 03/16/18 2039 03/17/18 0846 03/18/18 0254  WBC 25.9*  --  27.7* 19.7*  RBC 4.48  --  4.07* 3.55*  HGB 13.1 13.6 11.9* 10.4*  HCT 39.4 40.0 35.5* 31.8*  MCV 87.9  --  87.2 89.6  MCH 29.2  --  29.2 29.3  MCHC 33.2  --  33.5 32.7  RDW 12.3  --  12.4 12.5  PLT 274  --  279 249    Cardiac Enzymes Recent Labs  Lab 03/16/18 2336 03/17/18 1148 03/17/18 1650 03/17/18 2315  TROPONINI 0.22* 0.15* 0.14* 0.11*    Recent Labs  Lab 03/16/18 2042  TROPIPOC 0.08     BNP Recent Labs  Lab 03/17/18 1148  BNP 358.5*     Radiology    Mr Brain Wo Contrast  Result Date: 03/18/2018 CLINICAL DATA:  Expressive aphasia. Left arm and leg weakness. Status post tPA on 03/16/2018. EXAM: MRI HEAD WITHOUT CONTRAST TECHNIQUE: Multiplanar, multiecho pulse sequences of the brain and surrounding structures were obtained without intravenous contrast. COMPARISON:  03/17/2018 FINDINGS: Brain: There is no evidence of acute infarct, intracranial hemorrhage, mass, midline shift, or extra-axial fluid collection. There is mild cerebral atrophy. Scattered subcortical and deep cerebral white matter T2 hyperintensities  are nonspecific but compatible with mild chronic small vessel ischemic disease. 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 Vascular: Major intracranial vascular flow voids are preserved. Skull and upper cervical spine: Unremarkable bone marrow signal. Sinuses/Orbits: Unremarkable orbits. Chronic left maxillary sinusitis. No significant mastoid fluid. Other: None. IMPRESSION: 1. No acute intracranial abnormality. 2. Mild chronic small vessel ischemic disease. Electronically Signed   By: Logan Bores M.D.   On: 03/18/2018 14:50    Cardiac Studies   Echocardiogram - Left ventricle: The cavity size was normal. Systolic function was normal. The estimated ejection fraction was in the range of 60% to 65%. Wall motion was normal; there were no regional wall motion abnormalities. There was no evidence of elevated ventricular filling pressure by Doppler parameters. - Left atrium: The atrium was moderately dilated.  Impressions:  - No cardiac source of emboli was indentified.   Patient Profile     77 yo male with PMH of DM and HTN who presented with left sided weakness and facial droop. Found to have acute right hemispheric stroke and treated with IV tPA. He has a tunneling wound in the left foot and likely sepsis related to that. On abx.   Cardiology is asked for management of PVCs and sinus tachycardia. Started on BB. Echo with normal LVEF. No cardiac source of emboli was indentified.  Assessment & Plan    1. New onset atrial fibrillation - Review of telemetry showed that patient went into atrial fibrillation at rate of 110-120s since 9/22 @10am . Likely related to sepsis. ? Cause of stroke. Echo without cardiac source of emboli.  - CHADSVASC score of 6 for age, HTN, DM and stroke. Will need long term anticoagulation.  - BP stable. Consider increasing home toprol XL to 150mg  qd from 100mg  for better rate. however may keep at same dose to allow permissive hypertension. Hopefully  rate will improves with resolution of sepsis. Start amiodarone if difficult to control rate.  - EKG ordered now showed undetermined rhythm but mostly likely afib. Will review with MD to confirm rhythm and prior to start anticoagulation.   2. 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 Acute CVA - per neurology.   4. Hypokalemia - K of 3.3 yesterday. Will give Kdur 40 meq x 1. Check bmet tomorrow. Keep K around 4.   For questions or updates, please contact [facility] Please consult [email or web address] for contact info under Cardiology/STEMI.      Jarrett Soho, PA  03/20/2018, 11:09 AM

## 2018-03-21 ENCOUNTER — Inpatient Hospital Stay: Payer: Self-pay

## 2018-03-21 DIAGNOSIS — M86172 Other acute osteomyelitis, left ankle and foot: Secondary | ICD-10-CM

## 2018-03-21 DIAGNOSIS — A409 Streptococcal sepsis, unspecified: Secondary | ICD-10-CM

## 2018-03-21 DIAGNOSIS — N183 Chronic kidney disease, stage 3 unspecified: Secondary | ICD-10-CM

## 2018-03-21 DIAGNOSIS — I639 Cerebral infarction, unspecified: Secondary | ICD-10-CM

## 2018-03-21 DIAGNOSIS — E119 Type 2 diabetes mellitus without complications: Secondary | ICD-10-CM

## 2018-03-21 DIAGNOSIS — R6510 Systemic inflammatory response syndrome (SIRS) of non-infectious origin without acute organ dysfunction: Secondary | ICD-10-CM

## 2018-03-21 DIAGNOSIS — I10 Essential (primary) hypertension: Secondary | ICD-10-CM

## 2018-03-21 DIAGNOSIS — R000 Tachycardia, unspecified: Secondary | ICD-10-CM

## 2018-03-21 DIAGNOSIS — D62 Acute posthemorrhagic anemia: Secondary | ICD-10-CM

## 2018-03-21 DIAGNOSIS — N179 Acute kidney failure, unspecified: Secondary | ICD-10-CM

## 2018-03-21 DIAGNOSIS — D72829 Elevated white blood cell count, unspecified: Secondary | ICD-10-CM

## 2018-03-21 DIAGNOSIS — E876 Hypokalemia: Secondary | ICD-10-CM

## 2018-03-21 DIAGNOSIS — I480 Paroxysmal atrial fibrillation: Secondary | ICD-10-CM

## 2018-03-21 LAB — BASIC METABOLIC PANEL
Anion gap: 11 (ref 5–15)
BUN: 20 mg/dL (ref 8–23)
CO2: 24 mmol/L (ref 22–32)
Calcium: 7.8 mg/dL — ABNORMAL LOW (ref 8.9–10.3)
Chloride: 104 mmol/L (ref 98–111)
Creatinine, Ser: 1.46 mg/dL — ABNORMAL HIGH (ref 0.61–1.24)
GFR calc Af Amer: 53 mL/min — ABNORMAL LOW (ref >60)
GFR calc non Af Amer: 46 mL/min — ABNORMAL LOW (ref >60)
Glucose, Bld: 153 mg/dL — ABNORMAL HIGH (ref 70–99)
Potassium: 3.2 mmol/L — ABNORMAL LOW (ref 3.5–5.1)
Sodium: 139 mmol/L (ref 135–145)

## 2018-03-21 LAB — GLUCOSE, CAPILLARY
Glucose-Capillary: 220 mg/dL — ABNORMAL HIGH (ref 70–99)
Glucose-Capillary: 160 mg/dL — ABNORMAL HIGH (ref 70–99)
Glucose-Capillary: 163 mg/dL — ABNORMAL HIGH (ref 70–99)
Glucose-Capillary: 131 mg/dL — ABNORMAL HIGH (ref 70–99)
Glucose-Capillary: 161 mg/dL — ABNORMAL HIGH (ref 70–99)

## 2018-03-21 LAB — CBC
HCT: 34.7 % — ABNORMAL LOW (ref 39.0–52.0)
Hemoglobin: 11 g/dL — ABNORMAL LOW (ref 13.0–17.0)
MCH: 28.6 pg (ref 26.0–34.0)
MCHC: 31.7 g/dL (ref 30.0–36.0)
MCV: 90.4 fL (ref 78.0–100.0)
Platelets: 469 10*3/uL — ABNORMAL HIGH (ref 150–400)
RBC: 3.84 MIL/uL — ABNORMAL LOW (ref 4.22–5.81)
RDW: 13 % (ref 11.5–15.5)
WBC: 18.3 10*3/uL — ABNORMAL HIGH (ref 4.0–10.5)

## 2018-03-21 MED ORDER — METOPROLOL SUCCINATE ER 25 MG PO TB24
50.0000 mg | ORAL_TABLET | Freq: Once | ORAL | Status: DC
  Filled 2018-03-21: qty 2

## 2018-03-21 MED ORDER — INSULIN ASPART 100 UNIT/ML ~~LOC~~ SOLN
0.0000 [IU] | Freq: Every day | SUBCUTANEOUS | Status: DC
  Administered 2018-03-24: 5 [IU] via SUBCUTANEOUS
  Administered 2018-03-25: 3 [IU] via SUBCUTANEOUS

## 2018-03-21 MED ORDER — METOPROLOL SUCCINATE ER 25 MG PO TB24
150.0000 mg | ORAL_TABLET | Freq: Every day | ORAL | Status: DC
  Administered 2018-03-22 – 2018-03-27 (×6): 150 mg via ORAL
  Filled 2018-03-21 (×3): qty 2
  Filled 2018-03-21: qty 1
  Filled 2018-03-21 (×2): qty 2

## 2018-03-21 MED ORDER — POTASSIUM CHLORIDE CRYS ER 20 MEQ PO TBCR
40.0000 meq | EXTENDED_RELEASE_TABLET | Freq: Two times a day (BID) | ORAL | Status: AC
  Administered 2018-03-21 (×2): 40 meq via ORAL
  Filled 2018-03-21 (×2): qty 2

## 2018-03-21 MED ORDER — INSULIN ASPART 100 UNIT/ML ~~LOC~~ SOLN
0.0000 [IU] | Freq: Three times a day (TID) | SUBCUTANEOUS | Status: DC
  Administered 2018-03-21 (×2): 3 [IU] via SUBCUTANEOUS
  Administered 2018-03-21: 2 [IU] via SUBCUTANEOUS
  Administered 2018-03-22: 3 [IU] via SUBCUTANEOUS
  Administered 2018-03-22 – 2018-03-23 (×4): 5 [IU] via SUBCUTANEOUS
  Administered 2018-03-24: 2 [IU] via SUBCUTANEOUS
  Administered 2018-03-24: 3 [IU] via SUBCUTANEOUS
  Administered 2018-03-25 (×2): 5 [IU] via SUBCUTANEOUS
  Administered 2018-03-25 – 2018-03-26 (×3): 3 [IU] via SUBCUTANEOUS
  Administered 2018-03-26 – 2018-03-27 (×2): 5 [IU] via SUBCUTANEOUS
  Administered 2018-03-27: 3 [IU] via SUBCUTANEOUS

## 2018-03-21 MED ORDER — METOPROLOL SUCCINATE ER 25 MG PO TB24
50.0000 mg | ORAL_TABLET | Freq: Once | ORAL | Status: AC
  Administered 2018-03-21: 50 mg via ORAL
  Filled 2018-03-21: qty 2

## 2018-03-21 MED ORDER — INSULIN GLARGINE 100 UNIT/ML ~~LOC~~ SOLN
10.0000 [IU] | Freq: Every day | SUBCUTANEOUS | Status: DC
  Administered 2018-03-21 – 2018-03-22 (×2): 10 [IU] via SUBCUTANEOUS
  Filled 2018-03-21 (×3): qty 0.1

## 2018-03-21 NOTE — Progress Notes (Signed)
Progress Note  Patient Name: Kevin [name] Date of Encounter: 03/21/2018  Primary Cardiologist: Dr. [name]  Subjective   Patient was feeling better when seen this morning.  Denies any chest pain, dizziness, palpitations or shortness of breath.  Inpatient Medications    Scheduled Meds: . apixaban  5 mg Oral BID  . docusate sodium  100 mg Oral BID  . famotidine  20 mg Oral BID  . FLUoxetine  20 mg Oral Daily  . insulin aspart  0-15 Units Subcutaneous TID WC  . insulin aspart  0-5 Units Subcutaneous QHS  . insulin glargine  10 Units Subcutaneous QHS  . mesalamine  0.75 g Oral BID  . metFORMIN  1,000 mg Oral BID WC  . metoprolol succinate  100 mg Oral Daily  . rosuvastatin  5 mg Oral q1800   Continuous Infusions: . sodium chloride Stopped (03/18/18 1556)  . cefTRIAXone (ROCEPHIN)  IV     PRN Meds: sodium chloride, [DISCONTINUED] acetaminophen **OR** [DISCONTINUED] acetaminophen (TYLENOL) oral liquid 160 mg/5 mL **OR** acetaminophen, metoCLOPramide **OR** metoCLOPramide (REGLAN) injection, metoprolol tartrate, ondansetron **OR** ondansetron (ZOFRAN) IV, oxyCODONE **OR** oxyCODONE, senna-docusate   Vital Signs    Vitals:   03/20/18 2027 03/20/18 2353 03/21/18 0519 03/21/18 0748  BP: 127/79 129/79 123/79 120/69  Pulse: (!) 117 (!) 107 62 (!) 117  Resp: 18 18 18 16   Temp: 98 F (36.7 C)  (!) 97.4 F (36.3 C) 97.9 F (36.6 C)  TempSrc: Oral  Oral Oral  SpO2: 97% 97% 94% 95%  Weight:      Height:        Intake/Output Summary (Last 24 hours) at 03/21/2018 1018 Last data filed at 03/21/2018 3664 Gross per 24 hour  Intake 360 ml  Output 4450 ml  Net -4090 ml    I/O since admission:   Filed Weights   03/16/18 2000 03/16/18 2113  Weight: 215 lb 9.8 oz (97.8 kg) 215 lb 9.8 oz (97.8 kg)    Telemetry     - Personally Reviewed.  Remained in A. fib with RVR, heart rate mostly in 130s.  ECG    ECG (independently read by me): No EKG today.  Physical Exam     BP 120/69 (BP Location: Left Arm)   Pulse (!) 117   Temp 97.9 F (36.6 C) (Oral)   Resp 16   Ht 6\' 1"  (1.854 m)   Wt 215 lb 9.8 oz (97.8 kg)   SpO2 95%   BMI 28.45 kg/m  General: Alert, oriented, no distress.  Skin: normal turgor, no rashes, warm and dry HEENT: Normocephalic, atraumatic.  Neck: No JVD, no carotid bruits;  Lungs: clear to ausculatation and percussion; no wheezing or rales Chest wall: without tenderness to palpitation Heart: PMI not displaced, irregular rhythm with tachycardia, no gallop or rub. Abdomen: soft, nontender; no hepatosplenomehaly, BS+;  Pulses 2+ Extremities: Right foot with Ace bandage and mild edema and erythema. Psychologic: Normal mood and affect  Labs    Chemistry Recent Labs  Lab 03/16/18 2032  03/17/18 1148 03/18/18 0254 03/19/18 1010 03/21/18 0508  NA 126*   < > 126* 135 132* 139  K 4.7   < > 3.9 3.5 3.3* 3.2*  CL 90*   < > 89* 101 101 104  CO2 18*  --  24 22 22 24   GLUCOSE 304*   < > 292* 103* 230* 153*  BUN 33*   < > 32* 25* 23* 20  CREATININE 2.17*   < >  1.71* 1.52* 1.50* 1.46*  CALCIUM 8.3*  --  8.2* 7.2* 7.4* 7.8*  PROT 6.8  --   --   --   --   --   ALBUMIN 2.7*  --  2.6* 2.0*  --   --   AST 62*  --   --   --   --   --   ALT 46  --   --   --   --   --   ALKPHOS 117  --   --   --   --   --   BILITOT 1.1  --   --   --   --   --   GFRNONAA 28*  --  38* 43* 44* 46*  GFRAA 33*  --  44* 50* 51* 53*  ANIONGAP 18*  --  13 12 9 11    < > = values in this interval not displayed.     Hematology Recent Labs  Lab 03/17/18 0846 03/18/18 0254 03/21/18 0508  WBC 27.7* 19.7* 18.3*  RBC 4.07* 3.55* 3.84*  HGB 11.9* 10.4* 11.0*  HCT 35.5* 31.8* 34.7*  MCV 87.2 89.6 90.4  MCH 29.2 29.3 28.6  MCHC 33.5 32.7 31.7  RDW 12.4 12.5 13.0  PLT 279 249 469*    Cardiac Enzymes Recent Labs  Lab 03/16/18 2336 03/17/18 1148 03/17/18 1650 03/17/18 2315  TROPONINI 0.22* 0.15* 0.14* 0.11*    Recent Labs  Lab 03/16/18 2042   TROPIPOC 0.08     BNP Recent Labs  Lab 03/17/18 1148  BNP 358.5*     DDimer No results for input(s): DDIMER in the last 168 hours.   Lipid Panel     Component Value Date/Time   CHOL 75 03/17/2018 0339   TRIG 114 03/17/2018 0339   HDL 13 (L) 03/17/2018 0339   CHOLHDL 5.8 03/17/2018 0339   VLDL 23 03/17/2018 0339   LDLCALC 39 03/17/2018 0339    Radiology    [place] Ekg Site Rite  Result Date: 03/21/2018 If Site Rite image not attached, placement could not be confirmed due to current cardiac rhythm.   Cardiac Studies   Echocardiogram - Left ventricle: The cavity size was normal. Systolic function was normal. The estimated ejection fraction was in the range of 60% to 65%. Wall motion was normal; there were no regional wall motion abnormalities. There was no evidence of elevated ventricular filling pressure by Doppler parameters. - Left atrium: The atrium was moderately dilated.  Impressions:  - No cardiac source of emboli was indentified.  Patient Profile     78 y.o. male with PMH of DM and HTN who presented with left sided weakness and facial droop. Found to have acute right hemispheric stroke and treated with IV tPA. He has a tunneling wound in the left foot and likely sepsis related to that. On abx.   Cardiology is asked for management of PVCs and sinus tachycardia. Started on BB. Echo with normal LVEF. No cardiac source of emboli was indentified.  Assessment & Plan    New onset atrial fibrillation.  Patient remained in A. fib with RVR.  CHADVASC of 6, started on Eliquis yesterday.  Was given Toprol XR 100 mg today. Patient remained asymptomatic. We will give an extra dose of Toprol 50 mg today. He will get Toprol 150 mg daily from tomorrow. Keep monitoring. Continue Eliquis.  Hypokalemia.  Potassium at 3.2 again, magnesium within normal limit.he was given.  P.o. KCl 40 mEq yesterday. -Replete potassium. -Monitor  BMP.  Acute CVA. - per  neurology.   Osteomyelitis of left foot.  ID is following on IV antibiotics through PICC line.   Rollene Rotunda MD PGY2 03/21/2018, 10:18 AM

## 2018-03-21 NOTE — Care Management Note (Signed)
Case Management Note  Patient Details  Name: Kevin [name] MRN: [id number] Date of Birth: [birth year]  Subjective/Objective:      Pt admitted with stroke like symptoms and sepsis. He is from home with spouse.              Action/Plan: Recommendation is for CIR. Awaiting consult. CM following for d/c disposition.   Expected Discharge Date:                  Expected Discharge Plan:  [place]  In-House Referral:     Discharge planning Services  CM Consult  Post Acute Care Choice:    Choice offered to:     DME Arranged:    DME Agency:     HH Arranged:    [place] Agency:     Status of Service:  In process, will continue to follow  If discussed at Long Length of Stay Meetings, dates discussed:    Additional Comments:  Pollie Friar, RN 03/21/2018, 10:24 AM

## 2018-03-21 NOTE — Progress Notes (Signed)
Inpatient Rehabilitation Admissions Coordinator  I met with patient at bedside. I discussed his current functional level and patient reports that he and his wife prefer discharge home with [facility]. They are aware of need for home IV antibiotics and wife is a retired Therapist, sports. We will sign off at this time. Pt not in need of an inpt rehab admit at this time.  Danne Baxter, RN, MSN Rehab Admissions Coordinator [phone] 03/21/2018 6:29 PM

## 2018-03-21 NOTE — Progress Notes (Signed)
ID PROGRESS NOTE   Patient will need picc line for Iv abtx which plan to treat for 6 wk for osteomyelitis of foot. OPAT orders are placed with lab results to come to our clinic for review Will see back in the ID clinic in 4wk for follow up appt Defer to ortho for wound care and further surgical management

## 2018-03-21 NOTE — Progress Notes (Signed)
PROGRESS NOTE    Rubel Heckard  WUJ:[id number] DOB: November 04, 1940 DOA: 03/16/2018 PCP: [name], MD      Brief Narrative:  Mr. Veltre is a 77 y.o. M with NIDDM, HTN and Ulcerative colitis who presents with left sided hemiparesis.  In the ER, had left sided hemiparesis (LUE>LLE, normal strength on right), subtle left facial droop, resolving expressive aphasia.  Got tPA, admitted to ICU.  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  Heart rates 110-140 overnight. -Continue Eliquis -Increase metoprolol XL to 150  -Consult to Cardiology, appreciate cares  Diabetic foot ulcer with osteomyelitis Sepsis from group C strep and coag negative staph in diabetic foot ulcer Will need 6 weeks IV antibiotics. -Wet to dry dressings on discharge -HH RN for wound care -If patient still in hospital when [name] Given returns to town, he will be seen in hospital; outpatient follow up with [name] Given in 1 week already scheduled per Dr. Dennie Maizes request -Continue ceftriaxone -PICC line orders in place -OPAT order placed this morning -Consult to ID, appreciate cares  Diabetes -Hold sulfonylurea, Januvia, metformin, Invokana -Start Lantus 10 daily while inpatient -SSI ordered  Hypertension -Continue aspirin, statin -Continue  metoprolol  Ulcerative colitis Mild, not clinically active. -Hold Mesalamine right now  Other medications -Continue H2RA -Continue SSRI  Hyponatremia Resolved  Acute renal failure  Baseline Cr 0.8, peaked here at 2.2, now down to 1.5.   -Trend BMP daily -Low bioavailability, but discussed with RPh and some data that this has nephrotoxic effects, will hold mesalamine for a week      DVT prophylaxis: N?A Code Status: FULL Family Communication: Wfie at bedside MDM and disposition Plan: The below labs and imaging reports were reviewed and summarized above.    The patient was admitted with acute stroke, found to have new onset atrial fibrillation.  Also found to have sepsis from diabetic foot ulcer with osteomyelitis.  Infectious disease were consulted, recommending 6 weeks of IV antibiotics with ceftriaxone for coag negative staph, group C strep.  O PAT orders are in place, PICC line is pending.  Regarding atrial fibrillation, the patient's been seen by cardiology, started on anticoagulation, we are in the process of rate control.  If we are able to obtain PICC line, OPAT home health, and improvement in heart rate less than 110, will discharge home with home health tomorrow.   Consultants:   Critical care  Neurology  Infectious disease  Cardiology  Procedures:   Echocardiogram Study Conclusions  - Left ventricle: The cavity size was normal. Systolic function was   normal. The estimated ejection fraction was in the range of 60%   to 65%. Wall motion was normal; there were no regional wall   motion abnormalities. There was no evidence of elevated   ventricular filling pressure by Doppler parameters. - Left atrium: The atrium was moderately dilated.  Impressions:  - No cardiac source of emboli was indentified  Antimicrobials:   None   Subjective: The patient is feeling quite well today.  He has no confusion, fever, loss of consciousness.  No foot pain,  chills, swelling is improving.  No palpitations, chest pain, dyspnea.  Objective: Vitals:   03/20/18 2027 03/20/18 2353 03/21/18 0519 03/21/18 0748  BP: 127/79 129/79 123/79 120/69  Pulse: (!) 117 (!) 107 62 (!) 117  Resp: 18 18 18 16   Temp: 98 F (36.7 C)  (!) 97.4 F (36.3 C) 97.9 F (36.6 C)  TempSrc: Oral  Oral Oral  SpO2: 97% 97% 94% 95%  Weight:      Height:        Intake/Output Summary (Last 24 hours) at 03/21/2018 1102 Last data filed at 03/21/2018 7371 Gross per 24 hour  Intake 360 ml  Output 4450 ml  Net -4090 ml   Filed Weights   03/16/18 2000 03/16/18 2113  Weight: 97.8 kg (215 lb 9.8 oz) 97.8 kg (215 lb 9.8 oz)    Examination: General appearance: Elderly adult male, alert and in no acute distress.  Lying in bed. HEENT: Anicteric, conjunctiva pink, lids and lashes normal. No nasal deformity, discharge, epistaxis.  Lips moist, teeth normal.  Oropharynx moist, no oral lesions.  Hearing normal.   Skin: Warm and dry.  No jaundice.  No suspicious rashes or lesions.  Mild redness extending around the left foot wound, which is bandaged. Cardiac: Tachycardic, irregular, nl S1-S2, no murmurs appreciated.  Capillary refill is brisk.  JVP normal.  No LE edema.  Radia pulses 2+ and symmetric. Respiratory: Normal respiratory rate and rhythm.  Scattered wheezes, no rales. Abdomen: Abdomen soft.  No TTP or guarding. No ascites, distension, hepatosplenomegaly.   MSK: No deformities or effusions of the large joints of the upper or lower extremities bilaterally, with the exception that he has a left wrist drop. Neuro: Awake and alert.  Cranial nerves III through XII intact.  EOMI, moves all extremities, his left wrist extenders are weak, he has left wrist drop, which he says is chronic, wife confirms.  Strength 5/5 in both lower extremities.  Left upper extremity flexion and shoulder abduction are normal.  Speech fluent.    Psych: Sensorium intact and responding to questions, attention  normal. Affect normal.  Judgment and insight appear normal.    Data Reviewed: I have personally reviewed following labs and imaging studies:  CBC: Recent Labs  Lab 03/16/18 2032 03/16/18 2039 03/17/18 0846 03/18/18 0254 03/21/18 0508  WBC 25.9*  --  27.7* 19.7* 18.3*  NEUTROABS 23.8*  --  25.0*  --   --   HGB 13.1 13.6 11.9* 10.4* 11.0*  HCT 39.4 40.0 35.5* 31.8* 34.7*  MCV 87.9  --  87.2 89.6 90.4  PLT 274  --  279 249 062*   Basic Metabolic Panel: Recent Labs  Lab 03/16/18 2032 03/16/18 2039 03/17/18 1148 03/18/18 0254 03/19/18 1010 03/21/18 0508  NA 126* 124* 126* 135 132* 139  K 4.7 4.4 3.9 3.5 3.3* 3.2*  CL 90* 90* 89* 101 101 104  CO2 18*  --  24 22 22 24   GLUCOSE 304* 312* 292* 103* 230* 153*  BUN 33* 32* 32* 25* 23* 20  CREATININE 2.17* 1.90* 1.71* 1.52* 1.50* 1.46*  CALCIUM 8.3*  --  8.2* 7.2* 7.4* 7.8*  MG  --   --  2.0 2.0 1.9  --   PHOS  --   --  3.2 3.2  --   --    GFR:  Estimated Creatinine Clearance: 54.7 mL/min (A) (by C-G formula based on SCr of 1.46 mg/dL (H)). Liver Function Tests: Recent Labs  Lab 03/16/18 2032 03/17/18 1148 03/18/18 0254  AST 62*  --   --   ALT 46  --   --   ALKPHOS 117  --   --   BILITOT 1.1  --   --   PROT 6.8  --   --   ALBUMIN 2.7* 2.6* 2.0*   No results for input(s): LIPASE, AMYLASE in the last 168 hours. No results for input(s): AMMONIA in the last 168 hours. Coagulation Profile: Recent Labs  Lab 03/16/18 2032  INR 1.23   Cardiac Enzymes: Recent Labs  Lab 03/16/18 2336 03/17/18 1148 03/17/18 1650 03/17/18 2315  CKTOTAL 2,084*  --   --   --   CKMB 13.1*  --   --   --   TROPONINI 0.22* 0.15* 0.14* 0.11*   BNP (last 3 results) No results for input(s): PROBNP in the last 8760 hours. HbA1C: No results for input(s): HGBA1C in the last 72 hours. CBG: Recent Labs  Lab 03/20/18 1224 03/20/18 1626 03/20/18 2135 03/21/18 0616 03/21/18 0915  GLUCAP 236* 136* 173* 220* 160*   Lipid Profile: No  results for input(s): CHOL, HDL, LDLCALC, TRIG, CHOLHDL, LDLDIRECT in the last 72 hours. Thyroid Function Tests: No results for input(s): TSH, T4TOTAL, FREET4, T3FREE, THYROIDAB in the last 72 hours. Anemia Panel: No results for input(s): VITAMINB12, FOLATE, FERRITIN, TIBC, IRON, RETICCTPCT in the last 72 hours. Urine analysis:    Component Value Date/Time   COLORURINE YELLOW 03/16/2018 2125   APPEARANCEUR HAZY (A) 03/16/2018 2125   LABSPEC 1.011 03/16/2018 2125   PHURINE 6.0 03/16/2018 2125   GLUCOSEU >=500 (A) 03/16/2018 2125   HGBUR LARGE (A) 03/16/2018 2125   BILIRUBINUR NEGATIVE 03/16/2018 2125   KETONESUR 5 (A) 03/16/2018 2125   PROTEINUR 30 (A) 03/16/2018 2125   NITRITE NEGATIVE 03/16/2018 2125   LEUKOCYTESUR LARGE (A) 03/16/2018 2125   Sepsis Labs: @LABRCNTIP (procalcitonin:4,lacticacidven:4)  ) Recent Results (from the past 240 hour(s))  MRSA PCR Screening     Status: None   Collection Time: 03/16/18 10:37 PM  Result Value Ref Range Status   MRSA by PCR NEGATIVE NEGATIVE Final    Comment:        The GeneXpert MRSA Assay (FDA approved for NASAL specimens only), is one component of a comprehensive MRSA colonization surveillance program. It is not intended to diagnose MRSA infection nor to guide or monitor treatment for MRSA infections. Performed at [facility], [place] [address], [place], [place] [address]   Urine Culture     Status: Abnormal   Collection Time: 03/17/18  8:30 AM  Result Value Ref Range Status   Specimen Description URINE, CLEAN CATCH  Final   Special Requests   Final    NONE Performed at [facility], [place] [address], [place], [place] [address]    Culture >=100,000 COLONIES/mL ENTEROCOCCUS FAECALIS (A)  Final   Report Status 03/19/2018 FINAL  Final   Organism ID, Bacteria ENTEROCOCCUS FAECALIS (A)  Final      Susceptibility   Enterococcus faecalis - MIC*    AMPICILLIN <=2 SENSITIVE Sensitive     LEVOFLOXACIN 1 SENSITIVE Sensitive      NITROFURANTOIN <=16 SENSITIVE Sensitive     VANCOMYCIN 2 SENSITIVE Sensitive     * >=100,000 COLONIES/mL ENTEROCOCCUS FAECALIS  Culture, blood (routine x 2)     Status: None (Preliminary  result)   Collection Time: 03/17/18 11:48 AM  Result Value Ref Range Status   Specimen Description BLOOD RIGHT ANTECUBITAL  Final   Special Requests   Final    BOTTLES DRAWN AEROBIC AND ANAEROBIC Blood Culture adequate volume   Culture   Final    NO GROWTH 4 DAYS Performed at [facility], [place] [address], [place], [place] [address]    Report Status PENDING  Incomplete  Aerobic Culture (superficial specimen)     Status: None   Collection Time: 03/17/18 11:51 AM  Result Value Ref Range Status   Specimen Description WOUND LEFT FOOT  Final   Special Requests NONE  Final   Gram Stain   Final    FEW WBC PRESENT,BOTH PMN AND MONONUCLEAR ABUNDANT GRAM POSITIVE COCCI ABUNDANT GRAM NEGATIVE RODS Performed at [facility], [address], [place], [place] [address]    Culture   Final    ABUNDANT STAPHYLOCOCCUS SPECIES (COAGULASE NEGATIVE) ABUNDANT STREPTOCOCCUS GROUP C    Report Status 03/20/2018 FINAL  Final   Organism ID, Bacteria STAPHYLOCOCCUS SPECIES (COAGULASE NEGATIVE)  Final      Susceptibility   Staphylococcus species (coagulase negative) - MIC*    CIPROFLOXACIN <=0.5 SENSITIVE Sensitive     ERYTHROMYCIN <=0.25 SENSITIVE Sensitive     GENTAMICIN 8 INTERMEDIATE Intermediate     OXACILLIN SENSITIVE Sensitive     TETRACYCLINE <=1 SENSITIVE Sensitive     VANCOMYCIN 1 SENSITIVE Sensitive     TRIMETH/SULFA <=10 SENSITIVE Sensitive     CLINDAMYCIN <=0.25 SENSITIVE Sensitive     RIFAMPIN <=0.5 SENSITIVE Sensitive     Inducible Clindamycin NEGATIVE Sensitive     * ABUNDANT STAPHYLOCOCCUS SPECIES (COAGULASE NEGATIVE)  Culture, blood (routine x 2)     Status: None (Preliminary result)   Collection Time: 03/17/18 11:55 AM  Result Value Ref Range Status   Specimen Description BLOOD  LEFT ANTECUBITAL  Final   Special Requests   Final    BOTTLES DRAWN AEROBIC AND ANAEROBIC Blood Culture adequate volume   Culture   Final    NO GROWTH 4 DAYS Performed at [facility], [address], [place], [place] [address]    Report Status PENDING  Incomplete  Anaerobic culture     Status: None (Preliminary result)   Collection Time: 03/17/18  4:15 PM  Result Value Ref Range Status   Specimen Description WOUND LEFT FOOT  Final   Special Requests NONE  Final   Gram Stain   Final    NO ANAEROBES ISOLATED; CULTURE IN PROGRESS FOR 5 DAYS Performed at [facility], [address] [place], [place] [address]    Culture   Final    NO ANAEROBES ISOLATED; CULTURE IN PROGRESS FOR 5 DAYS   Report Status PENDING  Incomplete  Aerobic/Anaerobic Culture (surgical/deep wound)     Status: None (Preliminary result)   Collection Time: 03/17/18  8:28 PM  Result Value Ref Range Status   Specimen Description ABSCESS LEFT FOOT  Final   Special Requests   Final    PATIENT ON FOLLOWING ZOSYN VANC Performed at [facility], [place] [address] [place], [place] [address]    Gram Stain   Final    MODERATE WBC PRESENT, PREDOMINANTLY PMN MODERATE GRAM POSITIVE COCCI FEW GRAM NEGATIVE RODS    Culture   Final    MODERATE STREPTOCOCCUS GROUP C NO ANAEROBES ISOLATED; CULTURE IN PROGRESS FOR 5 DAYS    Report Status PENDING  Incomplete         Radiology Studies: [place] Ekg Site Rite  Result Date: 03/21/2018 If Site Rite image not attached, placement could not be confirmed due to current cardiac rhythm.       Scheduled Meds: . apixaban  5 mg Oral BID  . docusate sodium  100 mg Oral BID  . famotidine  20 mg Oral BID  . FLUoxetine  20 mg Oral Daily  . insulin aspart  0-15 Units Subcutaneous TID WC  . insulin aspart  0-5 Units Subcutaneous QHS  . insulin glargine  10 Units Subcutaneous QHS  . mesalamine  0.75 g Oral BID  . metFORMIN  1,000 mg Oral BID WC  . [START ON 03/22/2018]  metoprolol succinate  150 mg Oral Daily  . metoprolol succinate  50 mg Oral Once  . potassium chloride  40 mEq Oral BID  . rosuvastatin  5 mg Oral q1800   Continuous Infusions: . sodium chloride Stopped (03/18/18 1556)  . cefTRIAXone (ROCEPHIN)  IV 2 g (03/21/18 1011)     LOS: 5 days    Time spent: 25 minutes    Edwin Dada, MD [facility] 03/21/2018, 11:02 AM     Pager [phone] --- please page though AMION:  [email or web address] If 7PM-7AM, please contact night-coverage

## 2018-03-21 NOTE — Progress Notes (Signed)
STROKE TEAM PROGRESS NOTE   INTERVAL HISTORY His wife is at the bedside.  Remains tachycardic. Marland Kitchen Neuro stable without change.   Vitals:   03/20/18 2353 03/21/18 0519 03/21/18 0748 03/21/18 1225  BP: 129/79 123/79 120/69 130/87  Pulse: (!) 107 62 (!) 117 (!) 109  Resp: 18 18 16 16   Temp:  (!) 97.4 F (36.3 C) 97.9 F (36.6 C) 98.3 F (36.8 C)  TempSrc:  Oral Oral Oral  SpO2: 97% 94% 95% 98%  Weight:      Height:        CBC:  Recent Labs  Lab 03/16/18 2032  03/17/18 0846 03/18/18 0254 03/21/18 0508  WBC 25.9*  --  27.7* 19.7* 18.3*  NEUTROABS 23.8*  --  25.0*  --   --   HGB 13.1   < > 11.9* 10.4* 11.0*  HCT 39.4   < > 35.5* 31.8* 34.7*  MCV 87.9  --  87.2 89.6 90.4  PLT 274  --  279 249 469*   < > = values in this interval not displayed.    Basic Metabolic Panel:  Recent Labs  Lab 03/17/18 1148 03/18/18 0254 03/19/18 1010 03/21/18 0508  NA 126* 135 132* 139  K 3.9 3.5 3.3* 3.2*  CL 89* 101 101 104  CO2 24 22 22 24   GLUCOSE 292* 103* 230* 153*  BUN 32* 25* 23* 20  CREATININE 1.71* 1.52* 1.50* 1.46*  CALCIUM 8.2* 7.2* 7.4* 7.8*  MG 2.0 2.0 1.9  --   PHOS 3.2 3.2  --   --    Lipid Panel:     Component Value Date/Time   CHOL 75 03/17/2018 0339   TRIG 114 03/17/2018 0339   HDL 13 (L) 03/17/2018 0339   CHOLHDL 5.8 03/17/2018 0339   VLDL 23 03/17/2018 0339   LDLCALC 39 03/17/2018 0339   HgbA1c:  Lab Results  Component Value Date   HGBA1C 7.6 (H) 03/17/2018   Urine Drug Screen:     Component Value Date/Time   LABOPIA NONE DETECTED 03/16/2018 2125   COCAINSCRNUR NONE DETECTED 03/16/2018 2125   LABBENZ NONE DETECTED 03/16/2018 2125   AMPHETMU NONE DETECTED 03/16/2018 2125   THCU NONE DETECTED 03/16/2018 2125   LABBARB (A) 03/16/2018 2125    Result not available. Reagent lot number recalled by manufacturer.    Alcohol Level     Component Value Date/Time   ETH <10 03/16/2018 2032    IMAGING Ct Head Code Stroke Wo Contrast 03/16/2018 2051 1.  Negative for age. No acute cortically based infarct or intracranial hemorrhage is identified. 2. ASPECTS is 10. 3. Chronic maxillary sinusitis.   Ct Angio Head W Or Wo Contrast Ct Angio Neck W Or Wo Contrast 03/16/2018 2125 1. Negative for large vessel occlusion. 2. Positive for atherosclerosis in the head and neck, and Aortic Atherosclerosis (ICD10-I70.0). 3. 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 No hemodynamically significant carotid or right vertebral stenosis. 4.  Emphysema   Ct Head Wo Contrast 03/17/2018 1751 Negative non-contrast CT HEAD for age.   Mr Brain Wo Contrast 03/18/2018 1450 1. No acute intracranial abnormality. 2. Mild chronic small vessel ischemic disease.   2D Echocardiogram  03/17/2018  - Left ventricle: The cavity size was normal. Systolic function was normal. The estimated ejection fraction was in the range of 60% to 65%. Wall motion was normal; there were no regional wall motion abnormalities. There was no evidence of elevated ventricular filling pressure  by Doppler parameters.  - Left atrium: The atrium was moderately dilated. Impressions:   No cardiac source of emboli was indentified.   Dg Chest Port 1 View 03/17/2018 1235 No active disease.   Dg Foot 2 Views Left 03/17/2018 1236 Chronic changes without acute abnormality.    PHYSICAL EXAM per Dr. Leonie Man HEENT: [place]/AT Lungs: Respirations unlabored Ext: LLE has  Bandage from surgical debridement from 03/17/18 Neurologic Examination: Mental Status: Alert, can answer his name, but is confused and gives incorrect answers to rest of questions. He cannot complete a sentence without becoming sob. Speech is dysarthric. He poorly attends and has difficulty following commands.  Cranial Nerves: II:  Visual fields intact without extinction to DSS. PERRL.  III,IV, VI: EOMI, PERRL. No ptosis.  V,VII: Subtle left facial droop. Facial temp sensation equal bilaterally VIII:  hearing intact to voice IX,X: Palate rises symmetrically XI: Head at midline XII: midline tongue extension  Motor: RUE: 5/5 LUE: 4+/5 no.drift present. minimal weakness of left grip and intrinsic hand muscles  RLE 5/5 LLE: 4+/5 Sensory: Decreased sensation in left foot. Positive extinction to FT RUE and RLE.  Deep Tendon Reflexes:  1+ bilateral brachioradialis and biceps 1+ patellae bilaterally 0 achilles bilaterally Plantars: Right: [place]             Left: tonically upgoing Cerebellar: Prominent ataxia with FNF on left. No definite ataxia with H-S bilaterally. Prominent action tremor bilateral upper ext.  Gait: Deferred due to acuity of presentation   ASSESSMENT/PLAN [name] is a 77 y.o. male with history of HTN and DB presenting with L sided weakness, L facial droop and dysphagia.   Stroke-like episode s/p IV tPA  Code Stroke CT head No acute stroke. Chronic maxillary sinusitis. ASPECTS 10.     CTA head & neck no LVO. Atherosclerosis. 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 Emphysema.  CT head 24h after tPA negative  MRI  No acute stroke. Small vessel disease.    2D Echo  EF 60-65%. No source of embolus   LDL 39  HgbA1c 7.6  SCDs for VTE prophylaxis  aspirin 81 mg daily prior to admission, now on aspirin 81 mg daily. Continue aspirin 81 mg daily at d/c  Therapy recommendations:  HH OT, HH PT, 3N1, w/c, w/c cusion  Disposition:  pending   Hypertension  Home meds:  Metoprolol xl 100 daily  Now on: metoprolol xl 100 daily  Stable . BP goal normotensive  Hyperlipidemia  Home meds:  crestor 10, now on crestor 20  No indication for increased dose. Will decrease home dose. Recommend recheck in 4-6 weeks  LDL 39, goal < 70  Continue statin at discharge  Diabetes type II  Home meds: invokana 100 daily, amaryl 4mg  am, metformin 500 bid, januvia 100 daily  Now on:  SSI  HgbA1c 7.6, goal < 7.0  Uncontrolled  Glucoses variable:   [phone]  Resume metformin  Other Stroke Risk Factors  Advanced age  Osteomyelitis, L foot wound  S/p irrigation and debridement 6/20-6/21  Purulent drainage  Cx growth  On Rocephin, plan long-term treatment.  Vanc d/c'd  Afebrile  WBC 19.7 on 6/22  Sinus Tachycardia  w/ freq PACs, occ PVCs and occ aberrant QRS  2D - EF ok, no signs of large vegetation  On metoprolol and prn meds  Felt to be secondary to foot infections  Remains elevated 130s  Other Active Problems  Encephalopathy d/t osteo, resolved  Ulcerative colitis on mesalamine  Anxiety  on prozac  BPH on flomax  Hypokalemia 3.3 6/23  Renal 23/1.5 6/23  hyponatremia 132 6/23  IM consult to assist with medical management  ICU not needed from stroke standpoint. Defer to IM for appropriate placement.  Check labs in am  Hospital day # 5      he continues to do well neurologically and is being treated for his infected wound in the left foot and sepsis. He has developed new-onset A. Fib and will need to be  on anticoagulation with eliquis.for life.  Discussed with patient and his wife at the bedside and answered questions.transfer to rehabilitation when medically stable and bed available. Discuss with Dr. Eudelia Bunch and with cardiology resident. Dr. Reesa Chew. Greater than 50% and in this 25 minute visit was spent on counseling and coordination of care about his stroke and atrial fibrillation and answering questions    Antony Contras, MD Medical Director [place] Pager: [phone] 03/21/2018 1:35 PM  To contact Stroke Continuity provider, please refer to [email or web address] After hours, contact General Neurology

## 2018-03-21 NOTE — Progress Notes (Signed)
PHARMACY CONSULT NOTE FOR:  OUTPATIENT  PARENTERAL ANTIBIOTIC THERAPY (OPAT)  Indication: Osteomyelitis Regimen: Ceftriaxone 2gm IV q24h End date: 04/28/18  IV antibiotic discharge orders are pended. To discharging provider:  please sign these orders via discharge navigator,  Select New Orders & click on the button choice - Manage This Unsigned Work.      Dwayne A. Levada Dy, PharmD, [place] Pager: [phone]  03/21/2018, 8:18 AM

## 2018-03-21 NOTE — Progress Notes (Signed)
Two doses 50 mg metoprolol ordered. One dose given, MD notified. Orders given to hold one 50 mg dose due to duplicate.

## 2018-03-21 NOTE — Care Management Important Message (Signed)
Important Message  Patient Details  Name: Kevin [name] MRN: [id number] Date of Birth: 1941/03/18   Medicare Important Message Given:  Yes    [name] 03/21/2018, 3:39 PM

## 2018-03-21 NOTE — Consult Note (Signed)
Physical Medicine and Rehabilitation Consult Reason for Consult: Decreased functional mobility Referring Physician: Dr. Leonie Man   HPI: Kevin [name] is a 77 y.o. right-handed male with history of remote tobacco abuse, hypertension and diabetes mellitus.  Per chart review and patient, patient lives with spouse.  Reported to be independent driving prior to admission.  One level home with 2 steps to entry.  Presented 03/17/2018 after being found down with left-sided weakness, facial droop and aphasia.  Also noted foul-smelling purulent left foot ulcer.  CK 2084.  Cranial CT scan reviewed, unremarkable for acute process. CT angiogram of head and neck negative for large vessel occlusion.  Patient did receive TPA.  Echocardiogram with ejection fraction of 65% no wall motion abnormalities.  No cardiac source of emboli.  MRI of the brain negative.  On the later evening of 03/17/2018 patient became tachypneic, tachycardic with frequent PVCs.  Noted elevated WBC of 27,700.  Chest x-ray negative.  X-rays of cellulitic left foot showed chronic changes without acute abnormality.  Placed on broad-spectrum antibiotics for suspect sepsis related to wound infection.  Cardiology services consulted for frequent PVCs.  Noted elevated troponin  0.11-0.22.  Patient noted to be in A. fib on monitor and placed on Toprol as well as Eliquis later added to regimen.  Patient underwent irrigation debridement of left foot wound 03/17/2018 per Dr. Stann Mainland.  Infectious disease follow-up for left foot wound suspect osteomyelitis placed on 6 weeks of ceftriaxone.  Patient is partial weightbearing to left lower extremity.  Physical and occupational therapy evaluations completed with recommendations of physical medicine rehab consult. Discussed with primary physician.   Review of Systems  Constitutional: Negative for chills and fever.  HENT: Negative for hearing loss.   Eyes: Negative for blurred vision and double vision.    Respiratory: Negative for cough and shortness of breath.   Cardiovascular: Negative for chest pain and palpitations.  Gastrointestinal: Positive for constipation. Negative for nausea and vomiting.  Genitourinary: Negative for dysuria, flank pain and hematuria.  Musculoskeletal: Positive for myalgias.  Skin: Negative for rash.  Neurological: Positive for speech change.  All other systems reviewed and are negative.  Past Medical History:  Diagnosis Date  . Diabetes mellitus without complication ([place])   . Hypertension    Past Surgical History:  Procedure Laterality Date  . I&D EXTREMITY Left 03/17/2018   Procedure: IRRIGATION AND DEBRIDEMENT FOOT;  Surgeon: [name], MD;  Location: [facility];  Service: Orthopedics;  Laterality: Left;  . WRIST SURGERY     Family History  Problem Relation Age of Onset  . Hypertension Father    Social History:  reports that he has never smoked. He has never used smokeless tobacco. He reports that he drinks alcohol. He reports that he does not use drugs. Allergies:  Allergies  Allergen Reactions  . Sulfa Antibiotics Rash   Medications Prior to Admission  Medication Sig Dispense Refill  . aspirin EC 81 MG tablet Take 81 mg by mouth daily.    . canagliflozin (INVOKANA) 100 MG TABS tablet Take 100 mg by mouth daily before breakfast.    . FLUoxetine (PROZAC) 20 MG capsule Take 20 mg by mouth daily.    Marland Kitchen glimepiride (AMARYL) 4 MG tablet Take 2 mg by mouth daily with breakfast.    . mesalamine (APRISO) 0.375 g 24 hr capsule Take 750 mg by mouth 2 (two) times daily.     . metFORMIN (GLUCOPHAGE) 500 MG tablet Take 1,000 mg by  mouth 2 (two) times daily with a meal.     . metoprolol succinate (TOPROL-XL) 100 MG 24 hr tablet Take 100 mg by mouth daily. Take with or immediately following a meal.    . Multiple Vitamin (MULTIVITAMIN WITH MINERALS) TABS tablet Take 1 tablet by mouth daily.    . rosuvastatin (CRESTOR) 10 MG tablet Take 10 mg by mouth  daily.    . sitaGLIPtin (JANUVIA) 100 MG tablet Take 100 mg by mouth daily.    . tamsulosin (FLOMAX) 0.4 MG CAPS capsule Take 0.4 mg by mouth daily after breakfast.       Home: Home Living Family/patient expects to be discharged to:: Private residence Living Arrangements: Spouse/significant other Available Help at Discharge: Family, Friend(s), Available 24 hours/day Type of Home: House Home Access: Stairs to enter Technical brewer of Steps: 2 Entrance Stairs-Rails: Right, Left, Can reach both Home Layout: One level Bathroom Shower/Tub: Multimedia programmer: Standard Home Equipment: None  Lives With: Spouse  Functional History: Prior Function Level of Independence: Independent Comments: ADls, IADLs, driving, retired Functional Status:  Mobility: Bed Mobility Overal bed mobility: Needs Assistance Bed Mobility: Supine to Sit Supine to sit: Supervision Sit to supine: Supervision General bed mobility comments: for safety and lines  Transfers Overall transfer level: Needs assistance Equipment used: Rolling walker (2 wheeled) Transfers: Sit to/from Stand Sit to Stand: Mod assist, +2 safety/equipment Stand pivot transfers: Min assist, From elevated surface General transfer comment: assist for balance and mod cues for heel weight bearing Ambulation/Gait Ambulation/Gait assistance: Min assist, +2 safety/equipment Gait Distance (Feet): 12 Feet Assistive device: Rolling walker (2 wheeled) Gait Pattern/deviations: Step-to pattern, Wide base of support, Decreased stance time - left General Gait Details: cues for keeping L foot ahead and heel weight only, assist for balance, safety, and walker use    ADL: ADL Overall ADL's : Needs assistance/impaired Eating/Feeding: Set up, Sitting Grooming: Set up, Sitting, Supervision/safety Upper Body Bathing: Minimal assistance, Sitting Lower Body Bathing: Moderate assistance, Sit to/from stand Upper Body Dressing : Minimal  assistance, Sitting Lower Body Dressing: Moderate assistance, Sit to/from stand Lower Body Dressing Details (indicate cue type and reason): assist to don L sock over ace wrap this session once sitting EOB  Toilet Transfer: Moderate assistance, RW, Ambulation Toilet Transfer Details (indicate cue type and reason): Pt performing simulated toilet transfer with room level mobility. Required Mod A for safety and balance. VCs for RW management and wbing Functional mobility during ADLs: Moderate assistance, Rolling walker General ADL Comments: pt performing bed mobility and static sitting EOB with overall supervision for sitting balance; pt with fluctating and elevated HR sitting EOB noted up to low 150s. Due to increased HR returned to supine in bed, pt participating in A/AAROM to L shoulder once back in supine. Heat applied to shoulder as well   Cognition: Cognition Overall Cognitive Status: Impaired/Different from baseline Arousal/Alertness: Awake/alert Orientation Level: Oriented X4 Memory: Appears intact(Pt at baseline) Awareness: Appears intact Problem Solving: Appears intact Behaviors: Other (comment)(anxious) Safety/Judgment: Appears intact Cognition Arousal/Alertness: Awake/alert Behavior During Therapy: WFL for tasks assessed/performed Overall Cognitive Status: Impaired/Different from baseline Area of Impairment: Following commands, Safety/judgement, Problem solving, Awareness Orientation Level: Disoriented to, Time, Situation Memory: Decreased short-term memory Following Commands: Follows multi-step commands with increased time Safety/Judgement: Decreased awareness of safety, Decreased awareness of deficits Awareness: Emergent Problem Solving: Slow processing, Difficulty sequencing General Comments: Pt appears to have improved cognition this session, though not able to assess during functional tasks beyond sitting EOB this session.  Pt intermittently requiring mulitmodal cues for  following one step commands   Blood pressure 123/79, pulse 62, temperature (!) 97.4 F (36.3 C), temperature source Oral, resp. rate 18, height 6\' 1"  (1.854 m), weight 97.8 kg (215 lb 9.8 oz), SpO2 94 %. Physical Exam  Vitals reviewed. Constitutional: He is oriented to person, place, and time. He appears well-developed and well-nourished.  HENT:  Head: Normocephalic and atraumatic.  Eyes: EOM are normal. Right eye exhibits no discharge. Left eye exhibits no discharge.  Neck: Normal range of motion. Neck supple. No thyromegaly present.  Cardiovascular:  Irregular irregular  Respiratory: Effort normal and breath sounds normal. No respiratory distress.  GI: Soft. Bowel sounds are normal. He exhibits no distension.  Musculoskeletal:  Edema and tenderness left foot  Neurological: He is alert and oriented to person, place, and time.  Motor: RUE/RLE: 4+-5/5 proximal to distal LUE: Shoulder abduction, elbow flexion 4+/5, elbow extension 3/5, wrist extension 2/5, hand intrinsics 2+/5. ?+Ataxia. (?baseline) LLE: 4+/5 proximal to distal Sensation intact to light touch Left facial weakness  Skin:  Left foot dressing in place  Psychiatric:  ?Confusion    Results for orders placed or performed during the hospital encounter of 03/16/18 (from the past 24 hour(s))  Glucose, capillary     Status: Abnormal   Collection Time: 03/20/18  8:36 AM  Result Value Ref Range   Glucose-Capillary 132 (H) 65 - 99 mg/dL   Comment 1 Notify RN    Comment 2 Document in Chart   Glucose, capillary     Status: Abnormal   Collection Time: 03/20/18 12:24 PM  Result Value Ref Range   Glucose-Capillary 236 (H) 65 - 99 mg/dL   Comment 1 Notify RN    Comment 2 Document in Chart   Glucose, capillary     Status: Abnormal   Collection Time: 03/20/18  4:26 PM  Result Value Ref Range   Glucose-Capillary 136 (H) 65 - 99 mg/dL   Comment 1 Notify RN    Comment 2 Document in Chart   Glucose, capillary     Status:  Abnormal   Collection Time: 03/20/18  9:35 PM  Result Value Ref Range   Glucose-Capillary 173 (H) 65 - 99 mg/dL   Comment 1 Notify RN    Comment 2 Document in Chart    No results found.   Assessment/Plan: Diagnosis: Debility Labs and images independently reviewed.  Records reviewed and summated above.  1. Does the need for close, 24 hr/day medical supervision in concert with the patient's rehab needs make it unreasonable for this patient to be served in a less intensive setting? Potentially  2. Co-Morbidities requiring supervision/potential complications: remote tobacco abuse, HTN (monitor and provide prns in accordance with increased physical exertion and pain), DM (Monitor in accordance with exercise and adjust meds as necessary), A. fib (cont meds, monitor HR with increased mobility), hypokalemia (continue to monitor and replete as necessary), leukocytosis (cont to monitor for signs and symptoms of infection, further workup if indicated), ABLA (transfuse if necessary to ensure appropriate perfusion for increased activity tolerance), CKD (avoid nephrotoxic meds), Osteo (cont IV abx per ID) 3. Due to safety, skin/wound care, disease management, medication administration and patient education, does the patient require 24 hr/day rehab nursing? Potentially 4. Does the patient require coordinated care of a physician, rehab nurse, PT (1-2 hrs/day, 5 days/week) and OT (1-2 hrs/day, 5 days/week) to address physical and functional deficits in the context of the above medical diagnosis(es)? Potentially Addressing deficits in  the following areas: balance, endurance, locomotion, transferring, bathing, dressing and psychosocial support 5. Can the patient actively participate in an intensive therapy program of at least 3 hrs of therapy per day at least 5 days per week? Yes 6. The potential for patient to make measurable gains while on inpatient rehab is excellent 7. Anticipated functional outcomes upon  discharge from inpatient rehab are supervision and min assist  with PT, supervision with OT, n/a with SLP. 8. Estimated rehab length of stay to reach the above functional goals is: 6-10 days. 9. Anticipated D/C setting: Home 10. Anticipated post D/C treatments: HH therapy and Home excercise program 11. Overall Rehab/Functional Prognosis: excellent  RECOMMENDATIONS: This patient's condition is appropriate for continued rehabilitative care in the following setting: Will need to discuss with wife baseline level of functioning, however, without a 2 therapy need, pt will not quality for IRF.  Further, pt refusing CIR at present, stating he is at baseline.  Patient has agreed to participate in recommended program. Potentially Note that insurance prior authorization may be required for reimbursement for recommended care.  Comment: Rehab Admissions Coordinator to follow up.   I have personally performed a face to face diagnostic evaluation, including, but not limited to relevant history and physical exam findings, of this patient and developed relevant assessment and plan.  Additionally, I have reviewed and concur with the physician assistant's documentation above.   Delice Lesch, MD, ABPMR Lavon Paganini Angiulli, PA-C 03/21/2018

## 2018-03-22 DIAGNOSIS — I481 Persistent atrial fibrillation: Secondary | ICD-10-CM

## 2018-03-22 LAB — GLUCOSE, CAPILLARY
Glucose-Capillary: 186 mg/dL — ABNORMAL HIGH (ref 70–99)
Glucose-Capillary: 242 mg/dL — ABNORMAL HIGH (ref 70–99)
Glucose-Capillary: 114 mg/dL — ABNORMAL HIGH (ref 70–99)
Glucose-Capillary: 179 mg/dL — ABNORMAL HIGH (ref 70–99)

## 2018-03-22 LAB — CULTURE, BLOOD (ROUTINE X 2)
Culture: NO GROWTH
Culture: NO GROWTH
Special Requests: ADEQUATE
Special Requests: ADEQUATE

## 2018-03-22 LAB — BASIC METABOLIC PANEL
Anion gap: 10 (ref 5–15)
BUN: 17 mg/dL (ref 8–23)
CO2: 22 mmol/L (ref 22–32)
Calcium: 7.9 mg/dL — ABNORMAL LOW (ref 8.9–10.3)
Chloride: 105 mmol/L (ref 98–111)
Creatinine, Ser: 1.36 mg/dL — ABNORMAL HIGH (ref 0.61–1.24)
GFR calc Af Amer: 58 mL/min — ABNORMAL LOW (ref >60)
GFR calc non Af Amer: 50 mL/min — ABNORMAL LOW (ref >60)
Glucose, Bld: 171 mg/dL — ABNORMAL HIGH (ref 70–99)
Potassium: 4 mmol/L (ref 3.5–5.1)
Sodium: 137 mmol/L (ref 135–145)

## 2018-03-22 LAB — CBC
HCT: 35.2 % — ABNORMAL LOW (ref 39.0–52.0)
Hemoglobin: 11.1 g/dL — ABNORMAL LOW (ref 13.0–17.0)
MCH: 28.2 pg (ref 26.0–34.0)
MCHC: 31.5 g/dL (ref 30.0–36.0)
MCV: 89.6 fL (ref 78.0–100.0)
Platelets: 535 10*3/uL — ABNORMAL HIGH (ref 150–400)
RBC: 3.93 MIL/uL — ABNORMAL LOW (ref 4.22–5.81)
RDW: 13 % (ref 11.5–15.5)
WBC: 17.4 10*3/uL — ABNORMAL HIGH (ref 4.0–10.5)

## 2018-03-22 LAB — AEROBIC/ANAEROBIC CULTURE W GRAM STAIN (SURGICAL/DEEP WOUND)

## 2018-03-22 MED ORDER — DILTIAZEM HCL ER COATED BEADS 180 MG PO CP24: 180.0000 mg | ORAL_CAPSULE | Freq: Every day | ORAL | Status: DC

## 2018-03-22 MED ORDER — SODIUM CHLORIDE 0.9% FLUSH
10.0000 mL | INTRAVENOUS | Status: DC | PRN
  Administered 2018-03-26: 20 mL
  Filled 2018-03-22: qty 40

## 2018-03-22 MED ORDER — SODIUM CHLORIDE 0.9% FLUSH
10.0000 mL | Freq: Two times a day (BID) | INTRAVENOUS | Status: DC
  Administered 2018-03-22 – 2018-03-25 (×4): 10 mL
  Administered 2018-03-26 (×2): 20 mL
  Administered 2018-03-26 – 2018-03-27 (×2): 10 mL

## 2018-03-22 MED ORDER — DILTIAZEM HCL ER COATED BEADS 240 MG PO CP24
240.0000 mg | ORAL_CAPSULE | Freq: Every day | ORAL | Status: DC
  Administered 2018-03-22 – 2018-03-27 (×6): 240 mg via ORAL
  Filled 2018-03-22 (×6): qty 1

## 2018-03-22 NOTE — Care Management Note (Addendum)
Case Management Note  Patient Details  Name: Pacey Altizer MRN: [id number] Date of Birth: [birth year]  Subjective/Objective:                    Action/Plan: Pt and wife would prefer patient d/c home when medically ready. CM met with them and provided choice for [facility] agencies. They selected [facility]. Pam with [facility] notified (pt will required 6 week IV antibiotics at home) and accepted the referral.  Pt with orders for walker and 3 in 1. Will have DME delivered to the room on day of d/c.  Pt new to Eliquis. CM provided wife 30 day free card and submitted for benefits check.  CM continuing to follow.  Addendum: 1200 pm: wife has decided that she prefers the patient be re assessed for CIR. She does not feel comfortable taking him home. CM notified Barbara with CIR and PT/OT to reassess pt. CM following.  Expected Discharge Date:                  Expected Discharge Plan:  [place]  In-House Referral:     Discharge planning Services  CM Consult  Post Acute Care Choice:  Home Health Choice offered to:  Patient, Spouse  DME Arranged:    DME Agency:     HH Arranged:  RN, PT, OT, Nurse's Aide [place] Agency:  [facility]  Status of Service:  In process, will continue to follow  If discussed at Long Length of Stay Meetings, dates discussed:    Additional Comments:  Pollie Friar, RN 03/22/2018, 11:31 AM

## 2018-03-22 NOTE — Progress Notes (Signed)
Assumed care of patient at 1530

## 2018-03-22 NOTE — Progress Notes (Signed)
Progress Note  Patient Name: Kevin [name] Date of Encounter: 03/22/2018  Primary Cardiologist: Dr. [name]  Subjective   Patient denies any chest pain, SOB, dizziness or palpitations.  Inpatient Medications    Scheduled Meds: . apixaban  5 mg Oral BID  . docusate sodium  100 mg Oral BID  . famotidine  20 mg Oral BID  . FLUoxetine  20 mg Oral Daily  . insulin aspart  0-15 Units Subcutaneous TID WC  . insulin aspart  0-5 Units Subcutaneous QHS  . insulin glargine  10 Units Subcutaneous QHS  . metFORMIN  1,000 mg Oral BID WC  . metoprolol succinate  150 mg Oral Daily  . metoprolol succinate  50 mg Oral Once  . rosuvastatin  5 mg Oral q1800   Continuous Infusions: . sodium chloride Stopped (03/18/18 1556)  . cefTRIAXone (ROCEPHIN)  IV 2 g (03/21/18 1011)   PRN Meds: sodium chloride, [DISCONTINUED] acetaminophen **OR** [DISCONTINUED] acetaminophen (TYLENOL) oral liquid 160 mg/5 mL **OR** acetaminophen, metoCLOPramide **OR** metoCLOPramide (REGLAN) injection, metoprolol tartrate, ondansetron **OR** ondansetron (ZOFRAN) IV, oxyCODONE **OR** oxyCODONE, senna-docusate   Vital Signs    Vitals:   03/21/18 1225 03/21/18 1622 03/21/18 2029 03/21/18 2337  BP: 130/87 126/79 127/83 (!) 128/104  Pulse: (!) 109 96 (!) 104 (!) 136  Resp: 16 17 18 18   Temp: 98.3 F (36.8 C) (!) 97.5 F (36.4 C) 98 F (36.7 C) (!) 97.3 F (36.3 C)  TempSrc: Oral Oral Oral Oral  SpO2: 98% 98% 96% 99%  Weight:      Height:        Intake/Output Summary (Last 24 hours) at 03/22/2018 0826 Last data filed at 03/22/2018 0521 Gross per 24 hour  Intake 240 ml  Output 4820 ml  Net -4580 ml    I/O since admission:   Filed Weights   03/16/18 2000 03/16/18 2113  Weight: 215 lb 9.8 oz (97.8 kg) 215 lb 9.8 oz (97.8 kg)    Telemetry     - Personally Reviewed.  Remained in A. fib with RVR, heart rate mostly between 120-150.  ECG    ECG (independently read by me): No EKG today.  Physical  Exam   BP (!) 128/104 (BP Location: Left Arm)   Pulse (!) 136   Temp (!) 97.3 F (36.3 C) (Oral)   Resp 18   Ht 6\' 1"  (1.854 m)   Wt 215 lb 9.8 oz (97.8 kg)   SpO2 99%   BMI 28.45 kg/m  General: Alert, oriented, no distress.  Skin: normal turgor, no rashes, warm and dry HEENT: Normocephalic, atraumatic.  Neck: No JVD, no carotid bruits; normal carotid upstroke Lungs: clear to ausculatation and percussion; no wheezing or rales Chest wall: without tenderness to palpitation Heart: Irregularly irregular with tachycardia. Abdomen: soft, nontender; no hepatosplenomehaly, BS+;  Pulses 2+ Extremities: no clubbing cyanosis or edema, Homan's sign negative  Psychologic: Normal mood and affect  Labs    Chemistry Recent Labs  Lab 03/16/18 2032  03/17/18 1148 03/18/18 0254 03/19/18 1010 03/21/18 0508 03/22/18 0415  NA 126*   < > 126* 135 132* 139 137  K 4.7   < > 3.9 3.5 3.3* 3.2* 4.0  CL 90*   < > 89* 101 101 104 105  CO2 18*  --  24 22 22 24 22   GLUCOSE 304*   < > 292* 103* 230* 153* 171*  BUN 33*   < > 32* 25* 23* 20 17  CREATININE 2.17*   < >  1.71* 1.52* 1.50* 1.46* 1.36*  CALCIUM 8.3*  --  8.2* 7.2* 7.4* 7.8* 7.9*  PROT 6.8  --   --   --   --   --   --   ALBUMIN 2.7*  --  2.6* 2.0*  --   --   --   AST 62*  --   --   --   --   --   --   ALT 46  --   --   --   --   --   --   ALKPHOS 117  --   --   --   --   --   --   BILITOT 1.1  --   --   --   --   --   --   GFRNONAA 28*  --  38* 43* 44* 46* 50*  GFRAA 33*  --  44* 50* 51* 53* 58*  ANIONGAP 18*  --  13 12 9 11 10    < > = values in this interval not displayed.     Hematology Recent Labs  Lab 03/18/18 0254 03/21/18 0508 03/22/18 0415  WBC 19.7* 18.3* 17.4*  RBC 3.55* 3.84* 3.93*  HGB 10.4* 11.0* 11.1*  HCT 31.8* 34.7* 35.2*  MCV 89.6 90.4 89.6  MCH 29.3 28.6 28.2  MCHC 32.7 31.7 31.5  RDW 12.5 13.0 13.0  PLT 249 469* 535*    Cardiac Enzymes Recent Labs  Lab 03/16/18 2336 03/17/18 1148 03/17/18 1650  03/17/18 2315  TROPONINI 0.22* 0.15* 0.14* 0.11*    Recent Labs  Lab 03/16/18 2042  TROPIPOC 0.08     BNP Recent Labs  Lab 03/17/18 1148  BNP 358.5*     DDimer No results for input(s): DDIMER in the last 168 hours.   Lipid Panel     Component Value Date/Time   CHOL 75 03/17/2018 0339   TRIG 114 03/17/2018 0339   HDL 13 (L) 03/17/2018 0339   CHOLHDL 5.8 03/17/2018 0339   VLDL 23 03/17/2018 0339   LDLCALC 39 03/17/2018 0339    Radiology    [place] Ekg Site Rite  Result Date: 03/21/2018 If Site Rite image not attached, placement could not be confirmed due to current cardiac rhythm.   Cardiac Studies   Echocardiogram - Left ventricle: The cavity size was normal. Systolic function was normal. The estimated ejection fraction was in the range of 60% to 65%. Wall motion was normal; there were no regional wall motion abnormalities. There was no evidence of elevated ventricular filling pressure by Doppler parameters. - Left atrium: The atrium was moderately dilated.  Impressions:  - No cardiac source of emboli was indentified.  Patient Profile     77 y.o. male with PMH of DM and HTN who presented with left sided weakness and facial droop. Found to haveacute right hemispheric strokeandtreated with IV tPA. He has a tunneling wound in the left foot and likely sepsis related to that. On abx.   Cardiology is asked for management of PVCs and sinus tachycardia. Started on BB. Echo with normal LVEF.No cardiac source of emboli was indentified.  Assessment & Plan    New onset atrial fibrillation.  Patient remained in A. fib with RVR.  CHADVASC of 6, started on Eliquis yesterday.  Toprol-XL was increased to 150 mg yesterday, heart rate remains above 100, mostly in 120s to 130s. -Add Cardizem 240 mg daily. -Continue Toprol-XL 150 mg daily. -Continue Eliquis. -Keep monitoring.  Hypokalemia.  Resolved today with  potassium of 4.0.  Acute CVA. - per neurology.    Osteomyelitis of left foot.  ID is following on IV antibiotics through PICC line.   Rollene Rotunda MD PGY2 03/22/2018, 8:26 AM

## 2018-03-22 NOTE — Progress Notes (Signed)
Inpatient Rehabilitation Admissions Coordinator  I met with patient and spouse at bedside and discussed case with Dr. Delice Lesch All in agreement that an inpt rehab admission is warranted. Wife is requesting that surgeon assess his foot before proceeding with admission to CIR. I contacted Dr. Broadus John and sh is in agreement to admit. She states Dr Sharol Given is to see patient in the morning to assess foot wound. We will hold admit until [facility] see patient in the morning.  Danne Baxter, RN, MSN Rehab Admissions Coordinator [phone] 03/22/2018 3:19 PM

## 2018-03-22 NOTE — Progress Notes (Addendum)
Physical Therapy Treatment Patient Details Name: Kevin [name] MRN: [id number] DOB: [birth year] Today's Date: 03/22/2018    History of Present Illness  Kevin [name] is a 77 y.o.-year-old male with a left diabetic foot ulcer with abscess.  He was admitted to the neuro ICU following  stroke like symptoms 03/16/18.  He was noted to have a left plantar foot ulcer.  During the course of the day 6/21 he was decompensating and becoming septic.  The wound was foul-smelling and concern was that the source of his sepsis was the infected foot.  Underwent I&D left foot ulcer 03/17/18.   Stroke symptoms seem to have resolved.      PT Comments    Patient seen for progression of mobility. Pt is able to ambulate 65ft with RW and min A. Pt continues to require assistance for balance and safety with mobility. LOB with initial stand requiring assistance to recover.  Pt impulsive throughout session. Pt now agreeable to further rehab and is a good candidate for CIR level therapies to maximize independence and safety with mobility prior to d/c home. Wife present and agreeable that pt needs post acute rehab. Continue to progress as tolerated with anticipated d/c to CIR for further skilled PT services.    Follow Up Recommendations  Supervision/Assistance - 24 hour;CIR     Equipment Recommendations  Rolling walker with 5" wheels;3in1 (PT)    Recommendations for Other Services       Precautions / Restrictions Precautions Precautions: Fall Precaution Comments: watch HR Restrictions Weight Bearing Restrictions: Yes LLE Weight Bearing: Partial weight bearing LLE Partial Weight Bearing Percentage or Pounds: heel only Other Position/Activity Restrictions: has Darco shoe in room    Mobility  Bed Mobility Overal bed mobility: Needs Assistance Bed Mobility: Supine to Sit     Supine to sit: Supervision     General bed mobility comments: supervision for safety  Transfers Overall transfer level: Needs  assistance Equipment used: Rolling walker (2 wheeled) Transfers: Sit to/from Stand Sit to Stand: +2 safety/equipment;Min assist;Mod assist         General transfer comment: min A +2 to stand safely from EOB and mod A upon standing due to posterior LOB  Ambulation/Gait Ambulation/Gait assistance: Min assist;+2 safety/equipment;Mod assist   Assistive device: Rolling walker (2 wheeled) Gait Pattern/deviations: Step-to pattern;Wide base of support;Decreased stance time - left     General Gait Details: assistance required for balance and cues for weight bearing status and safe use of RW   Stairs Stairs: Yes Stairs assistance: Min assist Stair Management: Two rails;Alternating pattern   General stair comments: pt is very impulsive when attempting stair training and not listending to cues for safety or technique to practice simulating home entrance; assist for balance and safety   Wheelchair Mobility    Modified Rankin (Stroke Patients Only) Modified Rankin (Stroke Patients Only) Pre-Morbid Rankin Score: No symptoms Modified Rankin: Moderately severe disability     Balance Overall balance assessment: Needs assistance Sitting-balance support: No upper extremity supported;Feet supported Sitting balance-Leahy Scale: Good     Standing balance support: Bilateral upper extremity supported;During functional activity Standing balance-Leahy Scale: Poor Standing balance comment: relies heavily on UEs for support                            Cognition Arousal/Alertness: Awake/alert Behavior During Therapy: Impulsive Overall Cognitive Status: Impaired/Different from baseline Area of Impairment: Safety/judgement;Problem solving;Awareness  Memory: Decreased short-term memory   Safety/Judgement: Decreased awareness of safety;Decreased awareness of deficits Awareness: Emergent Problem Solving: Difficulty sequencing;Requires verbal cues;Slow  processing General Comments: pt impulsive during session and relied on multimodal cues to follow through with tasks safely      Exercises      General Comments General comments (skin integrity, edema, etc.): pt incontinent of stool during session       Pertinent Vitals/Pain Pain Assessment: Faces Faces Pain Scale: Hurts a little bit Pain Location: L foot Pain Descriptors / Indicators: Sore;Guarding Pain Intervention(s): Limited activity within patient's tolerance;Monitored during session;Repositioned    Home Living                      Prior Function            PT Goals (current goals can now be found in the care plan section) Acute Rehab PT Goals Patient Stated Goal: to get better PT Goal Formulation: With patient Time For Goal Achievement: 04/01/18 Potential to Achieve Goals: Good Progress towards PT goals: Progressing toward goals    Frequency    Min 3X/week      PT Plan Current plan remains appropriate    Co-evaluation              AM-PAC PT "6 Clicks" Daily Activity  Outcome Measure  Difficulty turning over in bed (including adjusting bedclothes, sheets and blankets)?: None Difficulty moving from lying on back to sitting on the side of the bed? : A Little Difficulty sitting down on and standing up from a chair with arms (e.g., wheelchair, bedside commode, etc,.)?: Unable Help needed moving to and from a bed to chair (including a wheelchair)?: A Little Help needed walking in hospital room?: A Little Help needed climbing 3-5 steps with a railing? : Total 6 Click Score: 15    End of Session Equipment Utilized During Treatment: Gait belt Activity Tolerance: Patient tolerated treatment well Patient left: with call bell/phone within reach;in chair;with family/visitor present Nurse Communication: Mobility status PT Visit Diagnosis: Other abnormalities of gait and mobility (R26.89);Unsteadiness on feet (R26.81);Pain Pain - Right/Left:  Right Pain - part of body: Ankle and joints of foot     Time: 5697-9480 PT Time Calculation (min) (ACUTE ONLY): 30 min  Charges:  $Gait Training: 8-22 mins $Therapeutic Activity: 8-22 mins                    G Codes:       Earney Navy, PTA Pager: [phone]     Darliss Cheney 03/22/2018, 1:12 PM

## 2018-03-22 NOTE — Progress Notes (Signed)
STROKE TEAM PROGRESS NOTE   INTERVAL HISTORY His wife is at the bedside. He is reluctant to go to CLR but after talking to me he is now willing. . Neuro stable without change. Ambulating in hallway with therapist  Vitals:   03/21/18 1225 03/21/18 1622 03/21/18 2029 03/21/18 2337  BP: 130/87 126/79 127/83 (!) 128/104  Pulse: (!) 109 96 (!) 104 (!) 136  Resp: 16 17 18 18   Temp: 98.3 F (36.8 C) (!) 97.5 F (36.4 C) 98 F (36.7 C) (!) 97.3 F (36.3 C)  TempSrc: Oral Oral Oral Oral  SpO2: 98% 98% 96% 99%  Weight:      Height:        CBC:  Recent Labs  Lab 03/16/18 2032  03/17/18 0846  03/21/18 0508 03/22/18 0415  WBC 25.9*  --  27.7*   < > 18.3* 17.4*  NEUTROABS 23.8*  --  25.0*  --   --   --   HGB 13.1   < > 11.9*   < > 11.0* 11.1*  HCT 39.4   < > 35.5*   < > 34.7* 35.2*  MCV 87.9  --  87.2   < > 90.4 89.6  PLT 274  --  279   < > 469* 535*   < > = values in this interval not displayed.    Basic Metabolic Panel:  Recent Labs  Lab 03/17/18 1148 03/18/18 0254 03/19/18 1010 03/21/18 0508 03/22/18 0415  NA 126* 135 132* 139 137  K 3.9 3.5 3.3* 3.2* 4.0  CL 89* 101 101 104 105  CO2 24 22 22 24 22   GLUCOSE 292* 103* 230* 153* 171*  BUN 32* 25* 23* 20 17  CREATININE 1.71* 1.52* 1.50* 1.46* 1.36*  CALCIUM 8.2* 7.2* 7.4* 7.8* 7.9*  MG 2.0 2.0 1.9  --   --   PHOS 3.2 3.2  --   --   --    Lipid Panel:     Component Value Date/Time   CHOL 75 03/17/2018 0339   TRIG 114 03/17/2018 0339   HDL 13 (L) 03/17/2018 0339   CHOLHDL 5.8 03/17/2018 0339   VLDL 23 03/17/2018 0339   LDLCALC 39 03/17/2018 0339   HgbA1c:  Lab Results  Component Value Date   HGBA1C 7.6 (H) 03/17/2018   Urine Drug Screen:     Component Value Date/Time   LABOPIA NONE DETECTED 03/16/2018 2125   COCAINSCRNUR NONE DETECTED 03/16/2018 2125   LABBENZ NONE DETECTED 03/16/2018 2125   AMPHETMU NONE DETECTED 03/16/2018 2125   THCU NONE DETECTED 03/16/2018 2125   LABBARB (A) 03/16/2018 2125     Result not available. Reagent lot number recalled by manufacturer.    Alcohol Level     Component Value Date/Time   ETH <10 03/16/2018 2032    IMAGING Ct Head Code Stroke Wo Contrast 03/16/2018 2051 1. Negative for age. No acute cortically based infarct or intracranial hemorrhage is identified. 2. ASPECTS is 10. 3. Chronic maxillary sinusitis.   Ct Angio Head W Or Wo Contrast Ct Angio Neck W Or Wo Contrast 03/16/2018 2125 1. Negative for large vessel occlusion. 2. Positive for atherosclerosis in the head and neck, and Aortic Atherosclerosis (ICD10-I70.0). 3. 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 No hemodynamically significant carotid or right vertebral stenosis. 4.  Emphysema   Ct Head Wo Contrast 03/17/2018 1751 Negative non-contrast CT HEAD for age.   Mr Brain Wo Contrast 03/18/2018 1450 1.  No acute intracranial abnormality. 2. Mild chronic small vessel ischemic disease.   2D Echocardiogram  03/17/2018  - Left ventricle: The cavity size was normal. Systolic function was normal. The estimated ejection fraction was in the range of 60% to 65%. Wall motion was normal; there were no regional wall motion abnormalities. There was no evidence of elevated ventricular filling pressure by Doppler parameters.  - Left atrium: The atrium was moderately dilated. Impressions:   No cardiac source of emboli was indentified.   Dg Chest Port 1 View 03/17/2018 1235 No active disease.   Dg Foot 2 Views Left 03/17/2018 1236 Chronic changes without acute abnormality.    PHYSICAL EXAM per Dr. Leonie Man HEENT: [place]/AT Lungs: Respirations unlabored Ext: LLE has  Bandage from surgical debridement from 03/17/18 Neurologic Examination: Mental Status: Alert, can answer his name, but is confused and gives incorrect answers to rest of questions. He cannot complete a sentence without becoming sob. Speech is dysarthric. He poorly attends and has difficulty  following commands.  Cranial Nerves: II:  Visual fields intact without extinction to DSS. PERRL.  III,IV, VI: EOMI, PERRL. No ptosis.  V,VII: Subtle left facial droop. Facial temp sensation equal bilaterally VIII: hearing intact to voice IX,X: Palate rises symmetrically XI: Head at midline XII: midline tongue extension  Motor: RUE: 5/5 LUE: 4+/5 no.drift present. minimal weakness of left grip and intrinsic hand muscles  RLE 5/5 LLE: 4+/5 Sensory: Decreased sensation in left foot. Positive extinction to FT RUE and RLE.  Deep Tendon Reflexes:  1+ bilateral brachioradialis and biceps 1+ patellae bilaterally 0 achilles bilaterally Plantars: Right: Mute             Left: tonically upgoing Cerebellar: Prominent ataxia with FNF on left. No definite ataxia with H-S bilaterally. Prominent action tremor bilateral upper ext.  Gait: Deferred due to acuity of presentation   ASSESSMENT/PLAN [name] is a 77 y.o. male with history of HTN and DB presenting with L sided weakness, L facial droop and dysphagia.   Stroke-like episode s/p IV tPA  Code Stroke CT head No acute stroke. Chronic maxillary sinusitis. ASPECTS 10.     CTA head & neck no LVO. Atherosclerosis. 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 Emphysema.  CT head 24h after tPA negative  MRI  No acute stroke. Small vessel disease.    2D Echo  EF 60-65%. No source of embolus   LDL 39  HgbA1c 7.6  SCDs for VTE prophylaxis  aspirin 81 mg daily prior to admission, now on aspirin 81 mg daily. Continue aspirin 81 mg daily at d/c  Therapy recommendations:  HH OT, HH PT, 3N1, w/c, w/c cusion  Disposition:  pending   Hypertension  Home meds:  Metoprolol xl 100 daily  Now on: metoprolol xl 100 daily  Stable . BP goal normotensive  Hyperlipidemia  Home meds:  crestor 10, now on crestor 20  No indication for increased dose. Will decrease home dose. Recommend recheck in 4-6 weeks  LDL 39, goal < 70  Continue  statin at discharge  Diabetes type II  Home meds: invokana 100 daily, amaryl 4mg  am, metformin 500 bid, januvia 100 daily  Now on:  SSI  HgbA1c 7.6, goal < 7.0  Uncontrolled  Glucoses variable:  [phone]  Resume metformin  Other Stroke Risk Factors  Advanced age  Osteomyelitis, L foot wound  S/p irrigation and debridement 6/20-6/21  Purulent drainage  Cx growth  On Rocephin, plan long-term treatment.  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 Defer to IM for appropriate placement.  Check labs in am  Hospital day # 6      he continues to do well neurologically and is being treated for his infected wound in the left foot and sepsis. He has developed new-onset A. Fib and will need to be  on anticoagulation with eliquis.for life.  Discussed with patient and his wife at the bedside and answered questions.transfer to rehabilitation when medically stable and bed available. Discuss with Dr. Broadus John . Greater than 50% and in this 25 minute visit was spent on counseling and coordination of care about his stroke and atrial fibrillation and answering questions Stroke team will sign off. Call for questiions.   Antony Contras, MD Medical Director [facility] Pager: [phone] 03/22/2018 1:40 PM  To contact Stroke Continuity provider, please refer to [email or web address] After hours, contact General Neurology

## 2018-03-22 NOTE — Progress Notes (Signed)
Occupational Therapy Treatment Patient Details Name: Kevin [name] MRN: [id number] DOB: [birth year] Today's Date: 03/22/2018    History of present illness  Kevin [name] is a 77 y.o.-year-old male with a left diabetic foot ulcer with abscess.  He was admitted to the neuro ICU following  stroke like symptoms 03/16/18.  He was noted to have a left plantar foot ulcer.  During the course of the day 6/21 he was decompensating and becoming septic.  The wound was foul-smelling and concern was that the source of his sepsis was the infected foot.  Underwent I&D left foot ulcer 03/17/18.   Stroke symptoms seem to have resolved.     OT comments  Pt progressing towards OT goals this session, continues to display short term memory deficits, decreased safety awareness, and increased need for support during ADL and transfers. Today he was unable to maintain standing at sink for oral care and WB status and was required to sit for grooming activities. He is showing progress in seated balance for LB ADL with lateral leans. Pt and wife in agreement with therapist for the need for short term intense inpatient rehab to maximize safety and independence in transfers and ADL. OT changed dc recommendation to reflect need for increased therapy post-acute. OT will continue to follow acutely.   Follow Up Recommendations  CIR;Supervision/Assistance - 24 hour    Equipment Recommendations  Other (comment)(defer to next venue)    Recommendations for Other Services      Precautions / Restrictions Precautions Precautions: Fall Precaution Comments: watch HR Restrictions Weight Bearing Restrictions: Yes LLE Weight Bearing: Partial weight bearing LLE Partial Weight Bearing Percentage or Pounds: heel only Other Position/Activity Restrictions: has Darco shoe in room       Mobility Bed Mobility Overal bed mobility: Needs Assistance Bed Mobility: Supine to Sit;Sit to Supine     Supine to sit: Supervision Sit to supine:  Supervision   General bed mobility comments: supervision for safety  Transfers Overall transfer level: Needs assistance Equipment used: Rolling walker (2 wheeled) Transfers: Sit to/from Stand Sit to Stand: Mod assist         General transfer comment: mod A for standing from EOB vc for safe hand placement    Balance Overall balance assessment: Needs assistance Sitting-balance support: No upper extremity supported;Feet supported Sitting balance-Leahy Scale: Good     Standing balance support: Bilateral upper extremity supported;During functional activity Standing balance-Leahy Scale: Poor Standing balance comment: relies heavily on UEs for support, unable to maintain standing balance without BUE support                           ADL either performed or assessed with clinical judgement   ADL Overall ADL's : Needs assistance/impaired     Grooming: Moderate assistance;Standing;Oral care;Wash/dry hands;Wash/dry face;Sitting Grooming Details (indicate cue type and reason): required seated ADL (unable to maintain standing balance for functional tasks) at sink with sit <>stand intermittently. Pt required cues for sequencing grooming tasks.             Lower Body Dressing: Min guard;Sitting/lateral leans Lower Body Dressing Details (indicate cue type and reason): able to don/doff right sock sitting EOB Toilet Transfer: Moderate assistance;RW;Ambulation Toilet Transfer Details (indicate cue type and reason): Pt performing simulated toilet transfer with room level mobility. Required Mod A for safety and balance. VCs for RW management and wbing         Functional mobility during ADLs: Moderate assistance;Rolling walker  Vision       Perception     Praxis      Cognition Arousal/Alertness: Awake/alert Behavior During Therapy: Impulsive Overall Cognitive Status: Impaired/Different from baseline Area of Impairment: Safety/judgement;Problem  solving;Awareness                     Memory: Decreased short-term memory   Safety/Judgement: Decreased awareness of safety;Decreased awareness of deficits Awareness: Emergent Problem Solving: Difficulty sequencing;Requires verbal cues;Slow processing General Comments: pt impulsive during session and relied on multimodal cues to follow through with tasks safely        Exercises     Shoulder Instructions       General Comments wife present throughout session    Pertinent Vitals/ Pain       Pain Assessment: Faces Faces Pain Scale: Hurts a little bit Pain Location: L foot Pain Descriptors / Indicators: Sore;Guarding Pain Intervention(s): Limited activity within patient's tolerance;Monitored during session;Repositioned  Home Living                                          Prior Functioning/Environment              Frequency  Min 3X/week        Progress Toward Goals  OT Goals(current goals can now be found in the care plan section)  Progress towards OT goals: Progressing toward goals  Acute Rehab OT Goals Patient Stated Goal: get to rehab and get independent OT Goal Formulation: With patient Time For Goal Achievement: 04/02/18 Potential to Achieve Goals: Good  Plan Discharge plan needs to be updated;Frequency remains appropriate    Co-evaluation                 AM-PAC PT "6 Clicks" Daily Activity     Outcome Measure   Help from another person eating meals?: None Help from another person taking care of personal grooming?: A Little(seated) Help from another person toileting, which includes using toliet, bedpan, or urinal?: A Little Help from another person bathing (including washing, rinsing, drying)?: A Lot Help from another person to put on and taking off regular upper body clothing?: A Little Help from another person to put on and taking off regular lower body clothing?: A Lot 6 Click Score: 17    End of Session  Equipment Utilized During Treatment: Gait belt;Rolling walker  OT Visit Diagnosis: Unsteadiness on feet (R26.81);Other abnormalities of gait and mobility (R26.89);Muscle weakness (generalized) (M62.81);Pain;Other symptoms and signs involving cognitive function Pain - Right/Left: Left Pain - part of body: Ankle and joints of foot   Activity Tolerance Patient tolerated treatment well   Patient Left in bed;with call bell/phone within reach   Nurse Communication Mobility status;Weight bearing status        Time: 3716-9678 OT Time Calculation (min): 24 min  Charges: OT General Charges $OT Visit: 1 Visit OT Treatments $Self Care/Home Management : 23-37 mins  Hulda Humphrey OTR/L [place] 03/22/2018, 2:17 PM

## 2018-03-22 NOTE — Progress Notes (Signed)
PROGRESS NOTE    Kevin [name]  WJX:[id number] DOB: June 03, 1941 DOA: 03/16/2018 PCP: [name], MD  Brief Narrative:  [name] is a 77 y.o. M with NIDDM, HTN and Ulcerative colitis who presented with left sided hemiparesis.  In the ER, had left sided hemiparesis (LUE>LLE, normal strength on right), subtle left facial droop, resolving expressive aphasia.  Got tPA, admitted to ICU.  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 -hospitalization complicated by diabetic foot ulcer and abscess with exposed bone, status post debridement by orthopedics Dr. 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 Fib and hence aspirin was changed over to Eliquis -PT OT eval completed, CIR was recommended, patient declines this, plan for discharge home with home health services when heart rate improved  New onset paroxysmal atrial fibrillation CHA2DS2-VASc Score = 6 (age, HTN, DM, Stroke).  Heart rates 120-130 range -Continue Eliquis -yesterday Cards increased metoprolol XL to 150, will increase Cardizem dose to 240mg  - Cards following  Diabetic foot ulcer abscess/osteomyelitis - Infectious disease following, due to exposed bone, being treated as osteomyelitis -status post excisional debridement of skin, subcutaneous tissue, muscle, fascia and bone of left plantar wound by [name] December on 6/21 - surgical cultures with group C streptococcus and coagulase-negative Staphylococcus - Plan  for 6 weeks of ceftriaxone starting 6/21 -PICC line placed, follow-up with infectious disease and 4 weeks -discussed with orthopedics [name] Given, he will follow-up  Diabetes -Hold sulfonylurea, Januvia, metformin, Invokana - continue Lantus 10 daily while inpatient - hemoglobin A1c is 7.6  Hypertension -Continue aspirin, statin -Continue metoprolol  Ulcerative colitis Mild, not clinically active. -Hold Mesalamine right now  Other medications -Continue H2RA -Continue SSRI  Hyponatremia Resolved  Acute renal failure  Baseline Cr 0.8, peaked here at 2.2, now down to 1.5.   -Trend BMP daily  DVT prophylaxis: eliquis Code Status: FULL Family Communication: Wfie at bedside Disposition Plan: home tomorrow if heart rate better  Consultants:   Critical care  Neurology  Infectious disease  Cardiology  Procedures:   Echocardiogram Study Conclusions  - Left ventricle: The cavity size was normal. Systolic function was   normal. The estimated ejection fraction was in the range of 60%   to 65%. Wall motion was normal; there were no regional wall   motion abnormalities. There was no evidence of elevated   ventricular filling pressure by Doppler parameters. - Left atrium: The atrium was moderately dilated.  Impressions:  - No cardiac source of emboli was indentified    Antimicrobials:   None   Subjective: -feels okay, some dyspnea with activity, exertion, denies any chest pain  Objective: Vitals:   03/21/18 1225 03/21/18 1622 03/21/18 2029 03/21/18 2337  BP: 130/87 126/79 127/83 (!) 128/104  Pulse: (!) 109 96 (!) 104 (!) 136  Resp: 16 17 18 18   Temp: 98.3 F (36.8 C) (!) 97.5 F (36.4 C) 98 F (36.7 C) (!) 97.3 F (36.3 C)  TempSrc: Oral Oral Oral Oral  SpO2: 98% 98% 96% 99%  Weight:  Height:        Intake/Output Summary (Last 24 hours) at 03/22/2018 1431 Last data filed at 03/22/2018 0521 Gross per 24 hour  Intake 240 ml  Output 3920 ml    Net -3680 ml   Filed Weights   03/16/18 2000 03/16/18 2113  Weight: 97.8 kg (215 lb 9.8 oz) 97.8 kg (215 lb 9.8 oz)    Examination: Gen: Awake, Alert, Oriented X 3, no distress HEENT: PERRLA, Neck supple, no JVD Lungs: decreased breath sounds at both bases, CVS: S1-S2/irregularly irregular, no murmurs appreciated Abd: soft, Non tender, non distended, BS present Extremities: left foot with surgical wound, gauze, no discharge noted, no evidence of erythema or inflammation around Skin: no new rashes  Data Reviewed: I have personally reviewed following labs and imaging studies:  CBC: Recent Labs  Lab 03/16/18 2032 03/16/18 2039 03/17/18 0846 03/18/18 0254 03/21/18 0508 03/22/18 0415  WBC 25.9*  --  27.7* 19.7* 18.3* 17.4*  NEUTROABS 23.8*  --  25.0*  --   --   --   HGB 13.1 13.6 11.9* 10.4* 11.0* 11.1*  HCT 39.4 40.0 35.5* 31.8* 34.7* 35.2*  MCV 87.9  --  87.2 89.6 90.4 89.6  PLT 274  --  279 249 469* 952*   Basic Metabolic Panel: Recent Labs  Lab 03/17/18 1148 03/18/18 0254 03/19/18 1010 03/21/18 0508 03/22/18 0415  NA 126* 135 132* 139 137  K 3.9 3.5 3.3* 3.2* 4.0  CL 89* 101 101 104 105  CO2 24 22 22 24 22   GLUCOSE 292* 103* 230* 153* 171*  BUN 32* 25* 23* 20 17  CREATININE 1.71* 1.52* 1.50* 1.46* 1.36*  CALCIUM 8.2* 7.2* 7.4* 7.8* 7.9*  MG 2.0 2.0 1.9  --   --   PHOS 3.2 3.2  --   --   --    GFR: Estimated Creatinine Clearance: 58.7 mL/min (A) (by C-G formula based on SCr of 1.36 mg/dL (H)). Liver Function Tests: Recent Labs  Lab 03/16/18 2032 03/17/18 1148 03/18/18 0254  AST 62*  --   --   ALT 46  --   --   ALKPHOS 117  --   --   BILITOT 1.1  --   --   PROT 6.8  --   --   ALBUMIN 2.7* 2.6* 2.0*   No results for input(s): LIPASE, AMYLASE in the last 168 hours. No results for input(s): AMMONIA in the last 168 hours. Coagulation Profile: Recent Labs  Lab 03/16/18 2032  INR 1.23   Cardiac Enzymes: Recent Labs  Lab 03/16/18 2336  03/17/18 1148 03/17/18 1650 03/17/18 2315  CKTOTAL 2,084*  --   --   --   CKMB 13.1*  --   --   --   TROPONINI 0.22* 0.15* 0.14* 0.11*   BNP (last 3 results) No results for input(s): PROBNP in the last 8760 hours. HbA1C: No results for input(s): HGBA1C in the last 72 hours. CBG: Recent Labs  Lab 03/21/18 1128 03/21/18 1655 03/21/18 2143 03/22/18 0624 03/22/18 1122  GLUCAP 163* 131* 161* 186* 242*   Lipid Profile: No results for input(s): CHOL, HDL, LDLCALC, TRIG, CHOLHDL, LDLDIRECT in the last 72 hours. Thyroid Function Tests: No results for input(s): TSH, T4TOTAL, FREET4, T3FREE, THYROIDAB in the last 72 hours. Anemia Panel: No results for input(s): VITAMINB12, FOLATE, FERRITIN, TIBC, IRON, RETICCTPCT in the last 72 hours. Urine analysis:    Component Value Date/Time   COLORURINE YELLOW 03/16/2018 2125   APPEARANCEUR HAZY (A) 03/16/2018  2125   LABSPEC 1.011 03/16/2018 2125   PHURINE 6.0 03/16/2018 2125   GLUCOSEU >=500 (A) 03/16/2018 2125   HGBUR LARGE (A) 03/16/2018 2125   BILIRUBINUR NEGATIVE 03/16/2018 2125   KETONESUR 5 (A) 03/16/2018 2125   PROTEINUR 30 (A) 03/16/2018 2125   NITRITE NEGATIVE 03/16/2018 2125   LEUKOCYTESUR LARGE (A) 03/16/2018 2125   Sepsis Labs: @LABRCNTIP (procalcitonin:4,lacticacidven:4)  ) Recent Results (from the past 240 hour(s))  MRSA PCR Screening     Status: None   Collection Time: 03/16/18 10:37 PM  Result Value Ref Range Status   MRSA by PCR NEGATIVE NEGATIVE Final    Comment:        The GeneXpert MRSA Assay (FDA approved for NASAL specimens only), is one component of a comprehensive MRSA colonization surveillance program. It is not intended to diagnose MRSA infection nor to guide or monitor treatment for MRSA infections. Performed at [facility], [place] [address] [place], [place] [address]   Urine Culture     Status: Abnormal   Collection Time: 03/17/18  8:30 AM  Result Value Ref Range Status   Specimen  Description URINE, CLEAN CATCH  Final   Special Requests   Final    NONE Performed at [facility], [place] [address], [place], [place] [address]    Culture >=100,000 COLONIES/mL ENTEROCOCCUS FAECALIS (A)  Final   Report Status 03/19/2018 FINAL  Final   Organism ID, Bacteria ENTEROCOCCUS FAECALIS (A)  Final      Susceptibility   Enterococcus faecalis - MIC*    AMPICILLIN <=2 SENSITIVE Sensitive     LEVOFLOXACIN 1 SENSITIVE Sensitive     NITROFURANTOIN <=16 SENSITIVE Sensitive     VANCOMYCIN 2 SENSITIVE Sensitive     * >=100,000 COLONIES/mL ENTEROCOCCUS FAECALIS  Culture, blood (routine x 2)     Status: None   Collection Time: 03/17/18 11:48 AM  Result Value Ref Range Status   Specimen Description BLOOD RIGHT ANTECUBITAL  Final   Special Requests   Final    BOTTLES DRAWN AEROBIC AND ANAEROBIC Blood Culture adequate volume   Culture   Final    NO GROWTH 5 DAYS Performed at [facility], [place] [address] [place], [place] [address]    Report Status 03/22/2018 FINAL  Final  Aerobic Culture (superficial specimen)     Status: None   Collection Time: 03/17/18 11:51 AM  Result Value Ref Range Status   Specimen Description WOUND LEFT FOOT  Final   Special Requests NONE  Final   Gram Stain   Final    FEW WBC PRESENT,BOTH PMN AND MONONUCLEAR ABUNDANT GRAM POSITIVE COCCI ABUNDANT GRAM NEGATIVE RODS Performed at [facility], [address] [place], [place] [address]    Culture   Final    ABUNDANT STAPHYLOCOCCUS SPECIES (COAGULASE NEGATIVE) ABUNDANT STREPTOCOCCUS GROUP C    Report Status 03/20/2018 FINAL  Final   Organism ID, Bacteria STAPHYLOCOCCUS SPECIES (COAGULASE NEGATIVE)  Final      Susceptibility   Staphylococcus species (coagulase negative) - MIC*    CIPROFLOXACIN <=0.5 SENSITIVE Sensitive     ERYTHROMYCIN <=0.25 SENSITIVE Sensitive     GENTAMICIN 8 INTERMEDIATE Intermediate     OXACILLIN SENSITIVE Sensitive     TETRACYCLINE <=1 SENSITIVE Sensitive      VANCOMYCIN 1 SENSITIVE Sensitive     TRIMETH/SULFA <=10 SENSITIVE Sensitive     CLINDAMYCIN <=0.25 SENSITIVE Sensitive     RIFAMPIN <=0.5 SENSITIVE Sensitive     Inducible Clindamycin  NEGATIVE Sensitive     * ABUNDANT STAPHYLOCOCCUS SPECIES (COAGULASE NEGATIVE)  Culture, blood (routine x 2)     Status: None   Collection Time: 03/17/18 11:55 AM  Result Value Ref Range Status   Specimen Description BLOOD LEFT ANTECUBITAL  Final   Special Requests   Final    BOTTLES DRAWN AEROBIC AND ANAEROBIC Blood Culture adequate volume   Culture   Final    NO GROWTH 5 DAYS Performed at [facility], [place] [address], [place], [place] [address]    Report Status 03/22/2018 FINAL  Final  Anaerobic culture     Status: None (Preliminary result)   Collection Time: 03/17/18  4:15 PM  Result Value Ref Range Status   Specimen Description WOUND LEFT FOOT  Final   Special Requests NONE  Final   Gram Stain   Final    NO ANAEROBES ISOLATED; CULTURE IN PROGRESS FOR 5 DAYS Performed at [facility], [place] [address], [place], [place] [address]    Culture   Final    NO ANAEROBES ISOLATED; CULTURE IN PROGRESS FOR 5 DAYS   Report Status PENDING  Incomplete  Aerobic/Anaerobic Culture (surgical/deep wound)     Status: None   Collection Time: 03/17/18  8:28 PM  Result Value Ref Range Status   Specimen Description ABSCESS LEFT FOOT  Final   Special Requests PATIENT ON FOLLOWING ZOSYN VANC  Final   Gram Stain   Final    MODERATE WBC PRESENT, PREDOMINANTLY PMN MODERATE GRAM POSITIVE COCCI FEW GRAM NEGATIVE RODS    Culture   Final    MODERATE STREPTOCOCCUS GROUP C MODERATE PREVOTELLA MELANINOGENICA BETA LACTAMASE POSITIVE Performed at [facility], [place] [address] [place], [place] [address]    Report Status 03/22/2018 FINAL  Final         Radiology Studies: [place] Ekg Site Rite  Result Date: 03/21/2018 If Site Rite image not attached, placement could not be confirmed due to current cardiac  rhythm.       Scheduled Meds: . apixaban  5 mg Oral BID  . diltiazem  240 mg Oral Daily  . docusate sodium  100 mg Oral BID  . famotidine  20 mg Oral BID  . FLUoxetine  20 mg Oral Daily  . insulin aspart  0-15 Units Subcutaneous TID WC  . insulin aspart  0-5 Units Subcutaneous QHS  . insulin glargine  10 Units Subcutaneous QHS  . metFORMIN  1,000 mg Oral BID WC  . metoprolol succinate  150 mg Oral Daily  . metoprolol succinate  50 mg Oral Once  . rosuvastatin  5 mg Oral q1800  . sodium chloride flush  10-40 mL Intracatheter Q12H   Continuous Infusions: . sodium chloride Stopped (03/18/18 1556)  . cefTRIAXone (ROCEPHIN)  IV 2 g (03/22/18 1007)     LOS: 6 days    Time spent: 35 minutes    Domenic Polite, MD [facility] 03/22/2018, 2:31 PM     Page via [email or web address] Password [email or web address] If 7PM-7AM, please contact night-coverage

## 2018-03-22 NOTE — Progress Notes (Signed)
Peripherally Inserted Central Catheter/Midline Placement  The IV Nurse has discussed with the patient and/or persons authorized to consent for the patient, the purpose of this procedure and the potential benefits and risks involved with this procedure.  The benefits include less needle sticks, lab draws from the catheter, and the patient may be discharged home with the catheter. Risks include, but not limited to, infection, bleeding, blood clot (thrombus formation), and puncture of an artery; nerve damage and irregular heartbeat and possibility to perform a PICC exchange if needed/ordered by physician.  Alternatives to this procedure were also discussed.  Bard Power PICC patient education guide, fact sheet on infection prevention and patient information card has been provided to patient /or left at bedside.    PICC/Midline Placement Documentation        Kevin [name] 03/22/2018, 8:32 AM

## 2018-03-23 ENCOUNTER — Inpatient Hospital Stay (HOSPITAL_COMMUNITY): Payer: Medicare Other

## 2018-03-23 LAB — BASIC METABOLIC PANEL
Anion gap: 8 (ref 5–15)
BUN: 18 mg/dL (ref 8–23)
CO2: 25 mmol/L (ref 22–32)
Calcium: 8 mg/dL — ABNORMAL LOW (ref 8.9–10.3)
Chloride: 102 mmol/L (ref 98–111)
Creatinine, Ser: 1.32 mg/dL — ABNORMAL HIGH (ref 0.61–1.24)
GFR calc Af Amer: 60 mL/min — ABNORMAL LOW (ref >60)
GFR calc non Af Amer: 51 mL/min — ABNORMAL LOW (ref >60)
Glucose, Bld: 212 mg/dL — ABNORMAL HIGH (ref 70–99)
Potassium: 4.7 mmol/L (ref 3.5–5.1)
Sodium: 135 mmol/L (ref 135–145)

## 2018-03-23 LAB — GLUCOSE, CAPILLARY
Glucose-Capillary: 213 mg/dL — ABNORMAL HIGH (ref 70–99)
Glucose-Capillary: 202 mg/dL — ABNORMAL HIGH (ref 70–99)

## 2018-03-23 LAB — CBC
HCT: 35.8 % — ABNORMAL LOW (ref 39.0–52.0)
Hemoglobin: 11.4 g/dL — ABNORMAL LOW (ref 13.0–17.0)
MCH: 28.8 pg (ref 26.0–34.0)
MCHC: 31.8 g/dL (ref 30.0–36.0)
MCV: 90.4 fL (ref 78.0–100.0)
Platelets: 618 10*3/uL — ABNORMAL HIGH (ref 150–400)
RBC: 3.96 MIL/uL — ABNORMAL LOW (ref 4.22–5.81)
RDW: 13 % (ref 11.5–15.5)
WBC: 18.3 10*3/uL — ABNORMAL HIGH (ref 4.0–10.5)

## 2018-03-23 LAB — SURGICAL PCR SCREEN
MRSA, PCR: NEGATIVE
Staphylococcus aureus: NEGATIVE

## 2018-03-23 MED ORDER — INSULIN GLARGINE 100 UNIT/ML ~~LOC~~ SOLN
20.0000 [IU] | Freq: Every day | SUBCUTANEOUS | Status: DC
  Administered 2018-03-23 – 2018-03-25 (×3): 20 [IU] via SUBCUTANEOUS
  Filled 2018-03-23 (×3): qty 0.2

## 2018-03-23 MED ORDER — GADOBENATE DIMEGLUMINE 529 MG/ML IV SOLN
20.0000 mL | Freq: Once | INTRAVENOUS | Status: AC
  Administered 2018-03-23: 20 mL via INTRAVENOUS

## 2018-03-23 NOTE — Progress Notes (Signed)
Inpatient Diabetes Program Recommendations  AACE/ADA: New Consensus Statement on Inpatient Glycemic Control (2015)  Target Ranges:  Prepandial:   less than 140 mg/dL      Peak postprandial:   less than 180 mg/dL (1-2 hours)      Critically ill patients:  140 - 180 mg/dL   Lab Results  Component Value Date   GLUCAP 202 (H) 03/23/2018   HGBA1C 7.6 (H) 03/17/2018    Review of Glycemic ControlResults for SELDEN, NOTEBOOM (MRN [id number]) as of 03/23/2018 12:23  Ref. Range 03/22/2018 11:22 03/22/2018 16:19 03/22/2018 21:24 03/23/2018 06:54 03/23/2018 11:45  Glucose-Capillary Latest Ref Range: 70 - 99 mg/dL 242 (H) 114 (H) 179 (H) 213 (H) 202 (H)   Diabetes history: Type 2 DM  Outpatient Diabetes medications:  Amaryl 2 mg with breakfast, Metformin 1000 mg bid, Januvia 100 mg daily Current orders for Inpatient glycemic control:  Lantus 10 units q HS, Novolog moderate tid with meals and HS, Metformin 1000 mg bid Inpatient Diabetes Program Recommendations:   Please increase Lantus to 15 units q HS.   Thanks,  Adah Perl, RN, BC-ADM Inpatient Diabetes Coordinator Pager [phone] (8a-5p)

## 2018-03-23 NOTE — Progress Notes (Signed)
Inpatient Rehabilitation Admissions Coordinator  I await plan of care with [name] Given before proceeding with dispo planning.  Danne Baxter, RN, MSN Rehab Admissions Coordinator [phone] 03/23/2018 4:08 PM

## 2018-03-23 NOTE — Consult Note (Signed)
            [facility] CM Primary Care Navigator  03/23/2018  Kevin [name] [birth year] [id number]   Went to seepatientat the bedside but he was resting/asleep and was able to meet wife [name]) toidentify possible discharge needs.  Wifereportsthatpatient had a fall at home that resulted to hospitalization. 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 Hospitalization was complicated by diabetic foot ulcer and abscess with exposed bone (status post debridement by orthopedics), on IV antibiotics for osteomyelitis.  Patient's wifeendorsesDr.Robert Ehinger with [place] [facility] as his primary care provider.   [facility]is [facility] [place] [facility].  Wife reports that patient has beenmanaging his medications at home, straight out of the containers.  Patient has been driving prior to admission butwife will be providingtransportationtohisdoctors' appointments after discharge.  Patientlives with wife who will serve as his primary  caregiverat home.  [facility].  Patient's wife voiced understandingto callprimary care provider'soffice whenhereturns backhome,for a post discharge follow-upvisitwithin1- 2 weeksor sooner if needs arise.Patient letter (with PCP's contact number) was provided to wife asareminder.   Explained topatient's wiferegarding [facility] CM services available for health management/ resourcesat home but she states that husband is capable in managing his health issues like hypertension/ diabetes as directed and followed up by primary care provider. Wife also reports that she prepares and cooks food according to his diet restrictions and helps patient in managing his health conditions at home. Patient's wife  verbalizedunderstandingto seekreferral from primary care provider to [facility] care management ifdeemed necessary and appropriatefor furtherservicesin the future-once patient is discharge backhome.   [facility] care management information was provided for futureneeds thatpatient may have.  Primary care provider's office is listed as providing transition of care (TOC) follow-up.  Noted order for EMMI Stroke calls, already in place.   For additional questions please contact:  Edwena Felty A. Ajel, BSN, RN-BC [facility] PRIMARY CARE Navigator Cell: [phone]

## 2018-03-23 NOTE — Progress Notes (Signed)
Progress Note  Patient Name: Kevin [name] Date of Encounter: 03/23/2018  Primary Cardiologist: Dr. [name]  Subjective   Patient remained asymptomatic today.  Has no complaints.  Denies any previous history of abnormal heartbeat.  Inpatient Medications    Scheduled Meds: . apixaban  5 mg Oral BID  . diltiazem  240 mg Oral Daily  . docusate sodium  100 mg Oral BID  . famotidine  20 mg Oral BID  . FLUoxetine  20 mg Oral Daily  . insulin aspart  0-15 Units Subcutaneous TID WC  . insulin aspart  0-5 Units Subcutaneous QHS  . insulin glargine  10 Units Subcutaneous QHS  . metFORMIN  1,000 mg Oral BID WC  . metoprolol succinate  150 mg Oral Daily  . metoprolol succinate  50 mg Oral Once  . rosuvastatin  5 mg Oral q1800  . sodium chloride flush  10-40 mL Intracatheter Q12H   Continuous Infusions: . sodium chloride Stopped (03/18/18 1556)  . cefTRIAXone (ROCEPHIN)  IV Stopped (03/22/18 2034)   PRN Meds: sodium chloride, [DISCONTINUED] acetaminophen **OR** [DISCONTINUED] acetaminophen (TYLENOL) oral liquid 160 mg/5 mL **OR** acetaminophen, metoCLOPramide **OR** metoCLOPramide (REGLAN) injection, metoprolol tartrate, ondansetron **OR** ondansetron (ZOFRAN) IV, oxyCODONE **OR** oxyCODONE, senna-docusate, sodium chloride flush   Vital Signs    Vitals:   03/22/18 1538 03/22/18 2036 03/23/18 0009 03/23/18 0516  BP: 130/84 (!) 161/89 (!) 151/86 (!) 114/101  Pulse: (!) 101 (!) 113 (!) 104 (!) 103  Resp: 18 18 18 18   Temp: 97.8 F (36.6 C) 98 F (36.7 C) (!) 97.4 F (36.3 C) (!) 97.5 F (36.4 C)  TempSrc: Oral Oral Oral Oral  SpO2: 97% 98% 95% 98%  Weight:      Height:        Intake/Output Summary (Last 24 hours) at 03/23/2018 4818 Last data filed at 03/23/2018 0347 Gross per 24 hour  Intake 340 ml  Output -  Net 340 ml    I/O since admission:   Filed Weights   03/16/18 2000 03/16/18 2113  Weight: 215 lb 9.8 oz (97.8 kg) 215 lb 9.8 oz (97.8 kg)    Telemetry      - Personally Reviewed.  Remained in A. fib with PVCs and few episodes of nonsustained V. Tach.  ECG    ECG (independently read by me): No EKG today.  Physical Exam    BP (!) 114/101 (BP Location: Left Arm)   Pulse (!) 103   Temp (!) 97.5 F (36.4 C) (Oral)   Resp 18   Ht 6\' 1"  (5.631 m)   Wt 215 lb 9.8 oz (97.8 kg)   SpO2 98%   BMI 28.45 kg/m  General: Alert, oriented, no distress.  Skin: normal turgor, no rashes, warm and dry HEENT: Normocephalic, atraumatic.  Neck: No JVD, no carotid bruits;  Lungs: clear to ausculatation and percussion; no wheezing or rales Chest wall: without tenderness to palpitation Heart:  Irregular heart rate with tachycardia. Abdomen: soft, nontender; no hepatosplenomehaly, BS+;  Pulses 2+ Extremities: Left foot edema and erythema, Ace bandage on left foot. Psychologic: Normal mood and affect   Labs    Chemistry Recent Labs  Lab 03/16/18 2032  03/17/18 1148 03/18/18 0254  03/21/18 0508 03/22/18 0415 03/23/18 0521  NA 126*   < > 126* 135   < > 139 137 135  K 4.7   < > 3.9 3.5   < > 3.2* 4.0 4.7  CL 90*   < > 89* 101   < >  104 105 102  CO2 18*  --  24 22   < > 24 22 25   GLUCOSE 304*   < > 292* 103*   < > 153* 171* 212*  BUN 33*   < > 32* 25*   < > 20 17 18   CREATININE 2.17*   < > 1.71* 1.52*   < > 1.46* 1.36* 1.32*  CALCIUM 8.3*  --  8.2* 7.2*   < > 7.8* 7.9* 8.0*  PROT 6.8  --   --   --   --   --   --   --   ALBUMIN 2.7*  --  2.6* 2.0*  --   --   --   --   AST 62*  --   --   --   --   --   --   --   ALT 46  --   --   --   --   --   --   --   ALKPHOS 117  --   --   --   --   --   --   --   BILITOT 1.1  --   --   --   --   --   --   --   GFRNONAA 28*  --  38* 43*   < > 46* 50* 51*  GFRAA 33*  --  44* 50*   < > 53* 58* 60*  ANIONGAP 18*  --  13 12   < > 11 10 8    < > = values in this interval not displayed.     Hematology Recent Labs  Lab 03/21/18 0508 03/22/18 0415 03/23/18 0521  WBC 18.3* 17.4* 18.3*  RBC 3.84* 3.93*  3.96*  HGB 11.0* 11.1* 11.4*  HCT 34.7* 35.2* 35.8*  MCV 90.4 89.6 90.4  MCH 28.6 28.2 28.8  MCHC 31.7 31.5 31.8  RDW 13.0 13.0 13.0  PLT 469* 535* 618*    Cardiac Enzymes Recent Labs  Lab 03/16/18 2336 03/17/18 1148 03/17/18 1650 03/17/18 2315  TROPONINI 0.22* 0.15* 0.14* 0.11*    Recent Labs  Lab 03/16/18 2042  TROPIPOC 0.08     BNP Recent Labs  Lab 03/17/18 1148  BNP 358.5*     DDimer No results for input(s): DDIMER in the last 168 hours.   Lipid Panel     Component Value Date/Time   CHOL 75 03/17/2018 0339   TRIG 114 03/17/2018 0339   HDL 13 (L) 03/17/2018 0339   CHOLHDL 5.8 03/17/2018 0339   VLDL 23 03/17/2018 0339   LDLCALC 39 03/17/2018 0339    Radiology    No results found.  Cardiac Studies   Echocardiogram - Left ventricle: The cavity size was normal. Systolic function was normal. The estimated ejection fraction was in the range of 60% to 65%. Wall motion was normal; there were no regional wall motion abnormalities. There was no evidence of elevated ventricular filling pressure by Doppler parameters. - Left atrium: The atrium was moderately dilated.  Impressions:  - No cardiac source of emboli was indentified.   Patient Profile     77 y.o. male  with PMH of DM and HTN who presented with left sided weakness and facial droop. Found to haveacute right hemispheric strokeandtreated with IV tPA. He has a tunneling wound in the left foot and likely sepsis related to that. On abx.   Cardiology is asked for management of PVCs and sinus tachycardia. Started on BB.  Echo with normal LVEF.No cardiac source of emboli was indentified.   Assessment & Plan    New onset atrial fibrillation.Patient remained in A. fib with mild RVR. CHADVASC of 6, on Eliquis .   Heart rate mostly remained around 100, improved as compared to before after adding Cardizem.  Blood pressure stable. We will try current management if patient remained in A.  fib can be considered for an elective cardioversion. -Continue Cardizem 240 mg daily. -Continue Toprol-XL 150 mg daily. -Continue Eliquis. -Keep monitoring.  Acute CVA. - per neurology.  Osteomyelitis of left foot.ID is following on IV antibiotics through PICC line.  Going for MRI of his foot today.  Rollene Rotunda MD PGY2 03/23/2018, 8:22 AM

## 2018-03-23 NOTE — Progress Notes (Addendum)
PROGRESS NOTE    Kevin [name]  EHU:[id number] DOB: 09/20/1941 DOA: 03/16/2018 PCP: [name], MD  Brief Narrative:  Kevin [name] is a 77 y.o. M with NIDDM, HTN and Ulcerative colitis who presented with left sided hemiparesis.  In the ER, had left sided hemiparesis (LUE>LLE, normal strength on right), subtle left facial droop, resolving expressive aphasia.  Got tPA, admitted to ICU.  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 -hospitalization complicated by diabetic foot ulcer and abscess with exposed bone, status post debridement by orthopedics Dr. 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 Fib and hence aspirin was changed over to Eliquis -PT OT eval completed, CIR was recommended, patient declines this, plan for discharge home with home health services when heart rate improved  New onset paroxysmal atrial fibrillation CHA2DS2-VASc Score = 6 (age, HTN, DM, Stroke).  Heart rates 120-130 range -Continue Eliquis -yesterday Cards increased metoprolol XL to 150, will increase Cardizem dose to 240mg  - Cards following  Diabetic foot ulcer abscess/osteomyelitis - Infectious disease following, due to exposed bone, being treated as osteomyelitis -status post excisional debridement of skin, subcutaneous tissue, muscle, fascia and bone of left plantar wound by [name] December on 6/21 - surgical cultures with group C streptococcus and  coagulase-negative Staphylococcus - Plan for 6 weeks of ceftriaxone starting 6/21 -PICC line placed, follow-up with infectious disease and 4 weeks -consulted Orthopedics Dr.Duda yesterday as suggested by Dr.Rogers, plan for MRI today and ? possible debridement   Diabetes -Hold sulfonylurea, Januvia, metformin, Invokana - continue Lantus 10 daily while inpatient - hemoglobin A1c is 7.6  Hypertension -Continue Eliquis, statin -Continue metoprolol  Ulcerative colitis Mild, not clinically active. -resume Mesalamine at discharge  Hyponatremia Resolved  Acute renal failure  Baseline Cr 0.8, peaked here at 2.2, now down to 1.5.   -Trend BMP daily  DVT prophylaxis: eliquis Code Status: FULL Family Communication: Wfie at bedside Disposition Plan: home tomorrow if heart rate better  Consultants:   Critical care  Neurology  Infectious disease  Cardiology  Procedures:   Echocardiogram Study Conclusions  - Left ventricle: The cavity size was normal. Systolic function was   normal. The estimated ejection fraction was in the range of 60%   to 65%. Wall motion was normal; there were no regional wall   motion abnormalities. There was no evidence of elevated   ventricular filling pressure by Doppler parameters. - Left atrium: The atrium was moderately dilated.  Impressions:  - No cardiac source of emboli was indentified    Antimicrobials:   None   Subjective: -feels better today, still weak overall, dyspnea improved, no chest pain -awaiting MRI scan as ordered by [name] Given  Objective: Vitals:   03/23/18 0009 03/23/18 0516 03/23/18 0915 03/23/18 1123  BP: (!) 151/86 (!) 114/101 97/84 131/82  Pulse: (!) 104 (!) 103 (!) 134 87  Resp: 18 18 20    Temp: (!) 97.4 F (36.3 C) (!) 97.5 F (36.4 C)  97.8 F (36.6 C)  TempSrc: Oral Oral  Oral  SpO2: 95% 98% 96%   Weight:      Height:        Intake/Output Summary (Last 24 hours) at 03/23/2018 1406 Last data  filed at 03/23/2018 4680 Gross per 24 hour  Intake 340 ml  Output 150 ml  Net 190 ml   Filed Weights   03/16/18 2000 03/16/18 2113  Weight: 97.8 kg (215 lb 9.8 oz) 97.8 kg (215 lb 9.8 oz)    Examination: Gen: Awake, Alert, Oriented X 3, no distress HEENT: PERRLA, Neck supple, no JVD Lungs: decreased breath sounds at both bases CVS: S1-S2 irregularly irregular Abd: soft, Non tender, non distended, BS present Extremities: left foot with surgical wound on the plantar surface with gauze, no discharge noted, no evidence of erythema or inflammation around Skin: no new rashes  Data Reviewed: I have personally reviewed following labs and imaging studies:  CBC: Recent Labs  Lab 03/16/18 2032  03/17/18 0846 03/18/18 0254 03/21/18 0508 03/22/18 0415 03/23/18 0521  WBC 25.9*  --  27.7* 19.7* 18.3* 17.4* 18.3*  NEUTROABS 23.8*  --  25.0*  --   --   --   --   HGB 13.1   < > 11.9* 10.4* 11.0* 11.1* 11.4*  HCT 39.4   < > 35.5* 31.8* 34.7* 35.2* 35.8*  MCV 87.9  --  87.2 89.6 90.4 89.6 90.4  PLT 274  --  279 249 469* 535* 618*   < > = values in this interval not displayed.   Basic Metabolic Panel: Recent Labs  Lab 03/17/18 1148 03/18/18 0254 03/19/18 1010 03/21/18 0508 03/22/18 0415 03/23/18 0521  NA 126* 135 132* 139 137 135  K 3.9 3.5 3.3* 3.2* 4.0 4.7  CL 89* 101 101 104 105 102  CO2 24 22 22 24 22 25   GLUCOSE 292* 103* 230* 153* 171* 212*  BUN 32* 25* 23* 20 17 18   CREATININE 1.71* 1.52* 1.50* 1.46* 1.36* 1.32*  CALCIUM 8.2* 7.2* 7.4* 7.8* 7.9* 8.0*  MG 2.0 2.0 1.9  --   --   --   PHOS 3.2 3.2  --   --   --   --    GFR: Estimated Creatinine Clearance: 60.5 mL/min (A) (by C-G formula based on SCr of 1.32 mg/dL (H)). Liver Function Tests: Recent Labs  Lab 03/16/18 2032 03/17/18 1148 03/18/18 0254  AST 62*  --   --   ALT 46  --   --   ALKPHOS 117  --   --   BILITOT 1.1  --   --   PROT 6.8  --   --   ALBUMIN 2.7* 2.6* 2.0*   No results for input(s): LIPASE,  AMYLASE in the last 168 hours. No results for input(s): AMMONIA in the last 168 hours. Coagulation Profile: Recent Labs  Lab 03/16/18 2032  INR 1.23   Cardiac Enzymes: Recent Labs  Lab 03/16/18 2336 03/17/18 1148 03/17/18 1650 03/17/18 2315  CKTOTAL 2,084*  --   --   --   CKMB 13.1*  --   --   --   TROPONINI 0.22* 0.15* 0.14* 0.11*   BNP (last 3 results) No results for input(s): PROBNP in the last 8760 hours. HbA1C: No results for input(s): HGBA1C in the last 72 hours. CBG: Recent Labs  Lab 03/22/18 1122 03/22/18 1619 03/22/18 2124 03/23/18 0654 03/23/18 1145  GLUCAP 242* 114* 179* 213* 202*   Lipid Profile: No results for input(s): CHOL, HDL, LDLCALC, TRIG, CHOLHDL, LDLDIRECT in  the last 72 hours. Thyroid Function Tests: No results for input(s): TSH, T4TOTAL, FREET4, T3FREE, THYROIDAB in the last 72 hours. Anemia Panel: No results for input(s): VITAMINB12, FOLATE, FERRITIN, TIBC, IRON, RETICCTPCT in the last 72 hours. Urine analysis:    Component Value Date/Time   COLORURINE YELLOW 03/16/2018 2125   APPEARANCEUR HAZY (A) 03/16/2018 2125   LABSPEC 1.011 03/16/2018 2125   PHURINE 6.0 03/16/2018 2125   GLUCOSEU >=500 (A) 03/16/2018 2125   HGBUR LARGE (A) 03/16/2018 2125   BILIRUBINUR NEGATIVE 03/16/2018 2125   KETONESUR 5 (A) 03/16/2018 2125   PROTEINUR 30 (A) 03/16/2018 2125   NITRITE NEGATIVE 03/16/2018 2125   LEUKOCYTESUR LARGE (A) 03/16/2018 2125   Sepsis Labs: @LABRCNTIP (procalcitonin:4,lacticacidven:4)  ) Recent Results (from the past 240 hour(s))  MRSA PCR Screening     Status: None   Collection Time: 03/16/18 10:37 PM  Result Value Ref Range Status   MRSA by PCR NEGATIVE NEGATIVE Final    Comment:        The GeneXpert MRSA Assay (FDA approved for NASAL specimens only), is one component of a comprehensive MRSA colonization surveillance program. It is not intended to diagnose MRSA infection nor to guide or monitor treatment for MRSA  infections. Performed at [facility], [place] [address], [place], [place] [address]   Urine Culture     Status: Abnormal   Collection Time: 03/17/18  8:30 AM  Result Value Ref Range Status   Specimen Description URINE, CLEAN CATCH  Final   Special Requests   Final    NONE Performed at [facility], [place] [address], [place], [place] [address]    Culture >=100,000 COLONIES/mL ENTEROCOCCUS FAECALIS (A)  Final   Report Status 03/19/2018 FINAL  Final   Organism ID, Bacteria ENTEROCOCCUS FAECALIS (A)  Final      Susceptibility   Enterococcus faecalis - MIC*    AMPICILLIN <=2 SENSITIVE Sensitive     LEVOFLOXACIN 1 SENSITIVE Sensitive     NITROFURANTOIN <=16 SENSITIVE Sensitive     VANCOMYCIN 2 SENSITIVE Sensitive     * >=100,000 COLONIES/mL ENTEROCOCCUS FAECALIS  Culture, blood (routine x 2)     Status: None   Collection Time: 03/17/18 11:48 AM  Result Value Ref Range Status   Specimen Description BLOOD RIGHT ANTECUBITAL  Final   Special Requests   Final    BOTTLES DRAWN AEROBIC AND ANAEROBIC Blood Culture adequate volume   Culture   Final    NO GROWTH 5 DAYS Performed at [facility], [place] [address], [place], [place] [address]    Report Status 03/22/2018 FINAL  Final  Aerobic Culture (superficial specimen)     Status: None   Collection Time: 03/17/18 11:51 AM  Result Value Ref Range Status   Specimen Description WOUND LEFT FOOT  Final   Special Requests NONE  Final   Gram Stain   Final    FEW WBC PRESENT,BOTH PMN AND MONONUCLEAR ABUNDANT GRAM POSITIVE COCCI ABUNDANT GRAM NEGATIVE RODS Performed at [facility], [address], [place], [place] [address]    Culture   Final    ABUNDANT STAPHYLOCOCCUS SPECIES (COAGULASE NEGATIVE) ABUNDANT STREPTOCOCCUS GROUP C    Report Status 03/20/2018 FINAL  Final   Organism ID, Bacteria STAPHYLOCOCCUS SPECIES (COAGULASE NEGATIVE)  Final      Susceptibility   Staphylococcus species (coagulase negative) - MIC*     CIPROFLOXACIN <=0.5 SENSITIVE Sensitive     ERYTHROMYCIN <=0.25 SENSITIVE Sensitive  GENTAMICIN 8 INTERMEDIATE Intermediate     OXACILLIN SENSITIVE Sensitive     TETRACYCLINE <=1 SENSITIVE Sensitive     VANCOMYCIN 1 SENSITIVE Sensitive     TRIMETH/SULFA <=10 SENSITIVE Sensitive     CLINDAMYCIN <=0.25 SENSITIVE Sensitive     RIFAMPIN <=0.5 SENSITIVE Sensitive     Inducible Clindamycin NEGATIVE Sensitive     * ABUNDANT STAPHYLOCOCCUS SPECIES (COAGULASE NEGATIVE)  Culture, blood (routine x 2)     Status: None   Collection Time: 03/17/18 11:55 AM  Result Value Ref Range Status   Specimen Description BLOOD LEFT ANTECUBITAL  Final   Special Requests   Final    BOTTLES DRAWN AEROBIC AND ANAEROBIC Blood Culture adequate volume   Culture   Final    NO GROWTH 5 DAYS Performed at [facility], [place] [address], [place], [place] [address]    Report Status 03/22/2018 FINAL  Final  Anaerobic culture     Status: None (Preliminary result)   Collection Time: 03/17/18  4:15 PM  Result Value Ref Range Status   Specimen Description WOUND LEFT FOOT  Final   Special Requests NONE  Final   Gram Stain   Final    NO ANAEROBES ISOLATED; CULTURE IN PROGRESS FOR 5 DAYS Performed at [facility], [place] [address], [place], [place] [address]    Culture   Final    NO ANAEROBES ISOLATED; CULTURE IN PROGRESS FOR 5 DAYS   Report Status PENDING  Incomplete  Aerobic/Anaerobic Culture (surgical/deep wound)     Status: None   Collection Time: 03/17/18  8:28 PM  Result Value Ref Range Status   Specimen Description ABSCESS LEFT FOOT  Final   Special Requests PATIENT ON FOLLOWING ZOSYN VANC  Final   Gram Stain   Final    MODERATE WBC PRESENT, PREDOMINANTLY PMN MODERATE GRAM POSITIVE COCCI FEW GRAM NEGATIVE RODS    Culture   Final    MODERATE STREPTOCOCCUS GROUP C MODERATE PREVOTELLA MELANINOGENICA BETA LACTAMASE POSITIVE Performed at [facility], [place] [address], [place], [place] [address]      Report Status 03/22/2018 FINAL  Final         Radiology Studies: No results found.      Scheduled Meds: . apixaban  5 mg Oral BID  . diltiazem  240 mg Oral Daily  . docusate sodium  100 mg Oral BID  . famotidine  20 mg Oral BID  . FLUoxetine  20 mg Oral Daily  . insulin aspart  0-15 Units Subcutaneous TID WC  . insulin aspart  0-5 Units Subcutaneous QHS  . insulin glargine  10 Units Subcutaneous QHS  . metFORMIN  1,000 mg Oral BID WC  . metoprolol succinate  150 mg Oral Daily  . metoprolol succinate  50 mg Oral Once  . rosuvastatin  5 mg Oral q1800  . sodium chloride flush  10-40 mL Intracatheter Q12H   Continuous Infusions: . sodium chloride Stopped (03/18/18 1556)  . cefTRIAXone (ROCEPHIN)  IV 2 g (03/23/18 1000)     LOS: 7 days    Time spent: 25 minutes    Domenic Polite, MD [facility] 03/23/2018, 2:06 PM     Page via [email or web address] Password [email or web address] If 7PM-7AM, please contact night-coverage

## 2018-03-24 ENCOUNTER — Encounter (HOSPITAL_COMMUNITY): Payer: Self-pay

## 2018-03-24 ENCOUNTER — Encounter (HOSPITAL_COMMUNITY)
Admission: EM | Disposition: A | Discharge status: Rehabilitation Facility | Payer: Self-pay | Source: Home / Self Care | Attending: Internal Medicine

## 2018-03-24 ENCOUNTER — Inpatient Hospital Stay (HOSPITAL_COMMUNITY): Payer: Medicare Other | Admitting: Anesthesiology

## 2018-03-24 ENCOUNTER — Other Ambulatory Visit (INDEPENDENT_AMBULATORY_CARE_PROVIDER_SITE_OTHER): Payer: Self-pay | Admitting: Orthopedic Surgery

## 2018-03-24 DIAGNOSIS — M86272 Subacute osteomyelitis, left ankle and foot: Secondary | ICD-10-CM

## 2018-03-24 HISTORY — PX: AMPUTATION: SHX166

## 2018-03-24 LAB — BASIC METABOLIC PANEL
Anion gap: 9 (ref 5–15)
BUN: 16 mg/dL (ref 8–23)
CO2: 27 mmol/L (ref 22–32)
Calcium: 8.2 mg/dL — ABNORMAL LOW (ref 8.9–10.3)
Chloride: 103 mmol/L (ref 98–111)
Creatinine, Ser: 1.23 mg/dL (ref 0.61–1.24)
GFR calc Af Amer: >60 mL/min (ref >60)
GFR calc non Af Amer: 56 mL/min — ABNORMAL LOW (ref >60)
Glucose, Bld: 183 mg/dL — ABNORMAL HIGH (ref 70–99)
Potassium: 3.8 mmol/L (ref 3.5–5.1)
Sodium: 139 mmol/L (ref 135–145)

## 2018-03-24 LAB — ANAEROBIC CULTURE

## 2018-03-24 LAB — GLUCOSE, CAPILLARY
Glucose-Capillary: 153 mg/dL — ABNORMAL HIGH (ref 70–99)
Glucose-Capillary: 203 mg/dL — ABNORMAL HIGH (ref 70–99)
Glucose-Capillary: 169 mg/dL — ABNORMAL HIGH (ref 70–99)
Glucose-Capillary: 201 mg/dL — ABNORMAL HIGH (ref 70–99)
Glucose-Capillary: 159 mg/dL — ABNORMAL HIGH (ref 70–99)
Glucose-Capillary: 156 mg/dL — ABNORMAL HIGH (ref 70–99)
Glucose-Capillary: 238 mg/dL — ABNORMAL HIGH (ref 70–99)

## 2018-03-24 LAB — CBC
HCT: 33.2 % — ABNORMAL LOW (ref 39.0–52.0)
Hemoglobin: 10.5 g/dL — ABNORMAL LOW (ref 13.0–17.0)
MCH: 28.5 pg (ref 26.0–34.0)
MCHC: 31.6 g/dL (ref 30.0–36.0)
MCV: 90 fL (ref 78.0–100.0)
Platelets: 617 10*3/uL — ABNORMAL HIGH (ref 150–400)
RBC: 3.69 MIL/uL — ABNORMAL LOW (ref 4.22–5.81)
RDW: 13 % (ref 11.5–15.5)
WBC: 14.8 10*3/uL — ABNORMAL HIGH (ref 4.0–10.5)

## 2018-03-24 SURGERY — AMPUTATION, FOOT, PARTIAL
Anesthesia: Choice | Site: Foot | Laterality: Left

## 2018-03-24 MED ORDER — LIDOCAINE HCL (CARDIAC) PF 100 MG/5ML IV SOSY
PREFILLED_SYRINGE | INTRAVENOUS | Status: DC | PRN
  Administered 2018-03-24: 20 mg via INTRATRACHEAL

## 2018-03-24 MED ORDER — ROPIVACAINE HCL 7.5 MG/ML IJ SOLN
INTRAMUSCULAR | Status: DC | PRN
  Administered 2018-03-24: 35 mL via PERINEURAL

## 2018-03-24 MED ORDER — LIDOCAINE HCL (PF) 2 % IJ SOLN
INTRAMUSCULAR | Status: DC | PRN
  Administered 2018-03-24: 5 mL via PERINEURAL

## 2018-03-24 MED ORDER — PROPOFOL 500 MG/50ML IV EMUL
INTRAVENOUS | Status: DC | PRN
  Administered 2018-03-24: 50 ug/kg/min via INTRAVENOUS

## 2018-03-24 MED ORDER — HYDROCODONE-ACETAMINOPHEN 5-325 MG PO TABS
1.0000 | ORAL_TABLET | ORAL | Status: DC | PRN
  Administered 2018-03-25: 2 via ORAL
  Administered 2018-03-25: 1 via ORAL
  Administered 2018-03-25 – 2018-03-26 (×2): 2 via ORAL
  Filled 2018-03-24: qty 1
  Filled 2018-03-24 (×4): qty 2

## 2018-03-24 MED ORDER — CEFAZOLIN SODIUM-DEXTROSE 2-4 GM/100ML-% IV SOLN
2.0000 g | INTRAVENOUS | Status: AC
  Administered 2018-03-24: 2 g via INTRAVENOUS
  Filled 2018-03-24: qty 100

## 2018-03-24 MED ORDER — 0.9 % SODIUM CHLORIDE (POUR BTL) OPTIME
TOPICAL | Status: DC | PRN
  Administered 2018-03-24: 1000 mL

## 2018-03-24 MED ORDER — PROPOFOL 10 MG/ML IV BOLUS
INTRAVENOUS | Status: DC | PRN
  Administered 2018-03-24: 50 mg via INTRAVENOUS
  Administered 2018-03-24: 20 mg via INTRAVENOUS

## 2018-03-24 MED ORDER — METHOCARBAMOL 500 MG PO TABS
500.0000 mg | ORAL_TABLET | Freq: Four times a day (QID) | ORAL | Status: DC | PRN
  Filled 2018-03-24: qty 1

## 2018-03-24 MED ORDER — LACTATED RINGERS IV SOLN
INTRAVENOUS | Status: DC | PRN
  Administered 2018-03-24: 16:00:00 via INTRAVENOUS

## 2018-03-24 MED ORDER — PROMETHAZINE HCL 25 MG/ML IJ SOLN: 6.2500 mg | INTRAMUSCULAR | Status: DC | PRN

## 2018-03-24 MED ORDER — DOCUSATE SODIUM 100 MG PO CAPS
100.0000 mg | ORAL_CAPSULE | Freq: Two times a day (BID) | ORAL | Status: DC
  Administered 2018-03-24 – 2018-03-27 (×5): 100 mg via ORAL
  Filled 2018-03-24 (×5): qty 1

## 2018-03-24 MED ORDER — METHOCARBAMOL 1000 MG/10ML IJ SOLN
500.0000 mg | Freq: Four times a day (QID) | INTRAVENOUS | Status: DC | PRN
  Filled 2018-03-24: qty 5

## 2018-03-24 MED ORDER — CEFAZOLIN SODIUM-DEXTROSE 2-4 GM/100ML-% IV SOLN
2.0000 g | Freq: Four times a day (QID) | INTRAVENOUS | Status: AC
  Administered 2018-03-24 – 2018-03-25 (×2): 2 g via INTRAVENOUS
  Filled 2018-03-24 (×2): qty 100

## 2018-03-24 MED ORDER — ONDANSETRON HCL 4 MG/2ML IJ SOLN: 4.0000 mg | Freq: Four times a day (QID) | INTRAMUSCULAR | Status: DC | PRN

## 2018-03-24 MED ORDER — MORPHINE SULFATE (PF) 2 MG/ML IV SOLN: 0.5000 mg | INTRAVENOUS | Status: DC | PRN

## 2018-03-24 MED ORDER — FENTANYL CITRATE (PF) 100 MCG/2ML IJ SOLN
INTRAMUSCULAR | Status: AC
  Administered 2018-03-24: 50 ug via INTRAVENOUS
  Filled 2018-03-24: qty 2

## 2018-03-24 MED ORDER — PHENYLEPHRINE HCL 10 MG/ML IJ SOLN
INTRAVENOUS | Status: DC | PRN
  Administered 2018-03-24: 20 ug/min via INTRAVENOUS

## 2018-03-24 MED ORDER — APIXABAN 5 MG PO TABS
5.0000 mg | ORAL_TABLET | Freq: Two times a day (BID) | ORAL | Status: DC
  Administered 2018-03-25 – 2018-03-27 (×5): 5 mg via ORAL
  Filled 2018-03-24 (×5): qty 1

## 2018-03-24 MED ORDER — POLYETHYLENE GLYCOL 3350 17 G PO PACK: 17.0000 g | PACK | Freq: Every day | ORAL | Status: DC | PRN

## 2018-03-24 MED ORDER — FENTANYL CITRATE (PF) 250 MCG/5ML IJ SOLN
INTRAMUSCULAR | Status: AC
  Filled 2018-03-24: qty 5

## 2018-03-24 MED ORDER — MEPERIDINE HCL 50 MG/ML IJ SOLN: 6.2500 mg | INTRAMUSCULAR | Status: DC | PRN

## 2018-03-24 MED ORDER — FENTANYL CITRATE (PF) 100 MCG/2ML IJ SOLN: 25.0000 ug | INTRAMUSCULAR | Status: DC | PRN

## 2018-03-24 MED ORDER — MAGNESIUM CITRATE PO SOLN: 1.0000 | Freq: Once | ORAL | Status: DC | PRN

## 2018-03-24 MED ORDER — CHLORHEXIDINE GLUCONATE 4 % EX LIQD
60.0000 mL | Freq: Once | CUTANEOUS | Status: AC
  Administered 2018-03-24: 4 via TOPICAL
  Filled 2018-03-24: qty 60

## 2018-03-24 MED ORDER — SODIUM CHLORIDE 0.9 % IV SOLN
INTRAVENOUS | Status: DC
  Administered 2018-03-24: 18:00:00 via INTRAVENOUS

## 2018-03-24 MED ORDER — METOCLOPRAMIDE HCL 5 MG PO TABS: 5.0000 mg | ORAL_TABLET | Freq: Three times a day (TID) | ORAL | Status: DC | PRN

## 2018-03-24 MED ORDER — ACETAMINOPHEN 325 MG PO TABS: 325.0000 mg | ORAL_TABLET | Freq: Four times a day (QID) | ORAL | Status: DC | PRN

## 2018-03-24 MED ORDER — FENTANYL CITRATE (PF) 100 MCG/2ML IJ SOLN
50.0000 ug | Freq: Once | INTRAMUSCULAR | Status: AC
  Administered 2018-03-24: 50 ug via INTRAVENOUS
  Filled 2018-03-24: qty 1

## 2018-03-24 MED ORDER — METOCLOPRAMIDE HCL 5 MG/ML IJ SOLN: 5.0000 mg | Freq: Three times a day (TID) | INTRAMUSCULAR | Status: DC | PRN

## 2018-03-24 MED ORDER — BISACODYL 10 MG RE SUPP: 10.0000 mg | Freq: Every day | RECTAL | Status: DC | PRN

## 2018-03-24 MED ORDER — MIDAZOLAM HCL 2 MG/2ML IJ SOLN
INTRAMUSCULAR | Status: AC
  Administered 2018-03-24: 16:00:00
  Filled 2018-03-24: qty 2

## 2018-03-24 MED ORDER — ONDANSETRON HCL 4 MG PO TABS: 4.0000 mg | ORAL_TABLET | Freq: Four times a day (QID) | ORAL | Status: DC | PRN

## 2018-03-24 MED ORDER — HYDROCODONE-ACETAMINOPHEN 7.5-325 MG PO TABS
1.0000 | ORAL_TABLET | ORAL | Status: DC | PRN
  Administered 2018-03-24 (×2): 1 via ORAL
  Filled 2018-03-24 (×2): qty 1

## 2018-03-24 MED ORDER — PROPOFOL 10 MG/ML IV BOLUS
INTRAVENOUS | Status: AC
  Filled 2018-03-24: qty 20

## 2018-03-24 SURGICAL SUPPLY — 33 items
BENZOIN TINCTURE PRP APPL 2/3 (GAUZE/BANDAGES/DRESSINGS) ×2 IMPLANT
BLADE SAW SGTL HD 18.5X60.5X1. (BLADE) ×2 IMPLANT
BLADE SURG 21 STRL SS (BLADE) ×2 IMPLANT
BNDG COHESIVE 4X5 TAN STRL (GAUZE/BANDAGES/DRESSINGS) IMPLANT
BNDG GAUZE ELAST 4 BULKY (GAUZE/BANDAGES/DRESSINGS) IMPLANT
COVER SURGICAL LIGHT HANDLE (MISCELLANEOUS) ×2 IMPLANT
DRAPE INCISE IOBAN 66X45 STRL (DRAPES) ×4 IMPLANT
DRAPE U-SHAPE 47X51 STRL (DRAPES) ×2 IMPLANT
DRESSING PREVENA PLUS CUSTOM (GAUZE/BANDAGES/DRESSINGS) ×1 IMPLANT
DRSG ADAPTIC 3X8 NADH LF (GAUZE/BANDAGES/DRESSINGS) IMPLANT
DRSG PAD ABDOMINAL 8X10 ST (GAUZE/BANDAGES/DRESSINGS) IMPLANT
DRSG PREVENA PLUS CUSTOM (GAUZE/BANDAGES/DRESSINGS) ×2
DURAPREP 26ML APPLICATOR (WOUND CARE) ×2 IMPLANT
ELECT REM PT RETURN 9FT ADLT (ELECTROSURGICAL) ×2
ELECTRODE REM PT RTRN 9FT ADLT (ELECTROSURGICAL) ×1 IMPLANT
GAUZE SPONGE 4X4 12PLY STRL (GAUZE/BANDAGES/DRESSINGS) IMPLANT
GLOVE BIOGEL PI IND STRL 9 (GLOVE) ×1 IMPLANT
GLOVE BIOGEL PI INDICATOR 9 (GLOVE) ×1
GLOVE SURG ORTHO 9.0 STRL STRW (GLOVE) ×2 IMPLANT
GOWN STRL REUS W/ TWL XL LVL3 (GOWN DISPOSABLE) ×3 IMPLANT
GOWN STRL REUS W/TWL XL LVL3 (GOWN DISPOSABLE) ×3
KIT BASIN OR (CUSTOM PROCEDURE TRAY) ×2 IMPLANT
KIT DRSG PREVENA PLUS 7DAY 125 (MISCELLANEOUS) ×2 IMPLANT
KIT TURNOVER KIT B (KITS) ×2 IMPLANT
NS IRRIG 1000ML POUR BTL (IV SOLUTION) ×2 IMPLANT
PACK ORTHO EXTREMITY (CUSTOM PROCEDURE TRAY) ×2 IMPLANT
PAD ARMBOARD 7.5X6 YLW CONV (MISCELLANEOUS) ×2 IMPLANT
SPONGE LAP 18X18 X RAY DECT (DISPOSABLE) IMPLANT
SUT ETHILON 2 0 PSLX (SUTURE) ×4 IMPLANT
TOWEL OR 17X26 10 PK STRL BLUE (TOWEL DISPOSABLE) ×2 IMPLANT
TUBE CONNECTING 12X1/4 (SUCTIONS) ×2 IMPLANT
WND VAC CANISTER 500ML (MISCELLANEOUS) ×2 IMPLANT
YANKAUER SUCT BULB TIP NO VENT (SUCTIONS) ×2 IMPLANT

## 2018-03-24 NOTE — Anesthesia Preprocedure Evaluation (Addendum)
Anesthesia Evaluation  Patient identified by MRN, date of birth, ID band Patient confused    Reviewed: Allergy & Precautions, NPO status , Patient's Chart, lab work & pertinent test results, reviewed documented beta blocker date and time   Airway Mallampati: II  TM Distance: >3 FB Neck ROM: Full    Dental  (+) Teeth Intact   Pulmonary    breath sounds clear to auscultation       Cardiovascular hypertension, Pt. on home beta blockers  Rhythm:Regular Rate:Normal     Neuro/Psych  Neuromuscular disease CVA    GI/Hepatic   Endo/Other  diabetes, Type 2  Renal/GU Renal disease     Musculoskeletal  (+) Arthritis ,   Abdominal   Peds  Hematology  (+) anemia ,   Anesthesia Other Findings   Reproductive/Obstetrics                             Anesthesia Physical  Anesthesia Plan  ASA: III  Anesthesia Plan: Regional   Post-op Pain Management:    Induction: Intravenous  PONV Risk Score and Plan: Ondansetron and Treatment may vary due to age or medical condition  Airway Management Planned:   Additional Equipment:   Intra-op Plan:   Post-operative Plan:   Informed Consent: I have reviewed the patients History and Physical, chart, labs and discussed the procedure including the risks, benefits and alternatives for the proposed anesthesia with the patient or authorized representative who has indicated his/her understanding and acceptance.   Dental advisory given  Plan Discussed with: CRNA  Anesthesia Plan Comments:        Anesthesia Quick Evaluation

## 2018-03-24 NOTE — Progress Notes (Signed)
Carolynn Sayers notified of potential need for home IV Abx x 6 weeks. [facility] is following for [facility] needs in event patient DC's to home. CIR also following

## 2018-03-24 NOTE — Progress Notes (Signed)
Per patient's request, I called his contact, [name], to update her on plan of care and today's surgery.

## 2018-03-24 NOTE — Progress Notes (Signed)
Occupational Therapy Treatment Patient Details Name: Kevin [name] MRN: [id number] DOB: 07/10/1941 Today's Date: 03/24/2018    History of present illness  Kevin [name] is a 77 y.o.-year-old male with a left diabetic foot ulcer with abscess.  He was admitted to the neuro ICU following  stroke like symptoms 03/16/18.  He was noted to have a left plantar foot ulcer.  During the course of the day 6/21 he was decompensating and becoming septic.  The wound was foul-smelling and concern was that the source of his sepsis was the infected foot.  Underwent I&D left foot ulcer 03/17/18.   Stroke symptoms seem to have resolved.     OT comments  Pt progressing towards OT goals this session. Initially required encouragement as he seemed anxious about life changes due to his upcoming surgery. OT provided encouragement and support. Better balance and activity tolerance today with toilet transfer (required max A for rear peri care) however still required seated break for oral care (able to perform sink level hand washing at min guard). CIR remains appropriate post-sx which is scheduled for later this afternoon. OT will continue to follow acutely.    Follow Up Recommendations  CIR;Supervision/Assistance - 24 hour    Equipment Recommendations  Other (comment)(defer to next venue of care)    Recommendations for Other Services      Precautions / Restrictions Precautions Precautions: Fall Restrictions Weight Bearing Restrictions: Yes LLE Weight Bearing: Partial weight bearing LLE Partial Weight Bearing Percentage or Pounds: heel Other Position/Activity Restrictions: has Darco shoe in room       Mobility Bed Mobility Overal bed mobility: Needs Assistance Bed Mobility: Supine to Sit     Supine to sit: Supervision     General bed mobility comments: supervision for safety  Transfers Overall transfer level: Needs assistance Equipment used: Rolling walker (2 wheeled) Transfers: Sit to/from  Stand Sit to Stand: Min assist         General transfer comment: min A to steady walker    Balance Overall balance assessment: Needs assistance Sitting-balance support: No upper extremity supported;Feet supported Sitting balance-Leahy Scale: Good     Standing balance support: Bilateral upper extremity supported;During functional activity Standing balance-Leahy Scale: Fair Standing balance comment: able to perform washing hands at sink in standing position without LOB                           ADL either performed or assessed with clinical judgement   ADL Overall ADL's : Needs assistance/impaired Eating/Feeding: NPO   Grooming: Set up;Sitting;Oral care;Wash/dry face Grooming Details (indicate cue type and reason): requested seated oral care/washing face             Lower Body Dressing: Moderate assistance;Sitting/lateral leans Lower Body Dressing Details (indicate cue type and reason): ton don darco shoe Toilet Transfer: Min guard;Cueing for safety;RW;Ambulation;Comfort height toilet;Grab bars   Toileting- Clothing Manipulation and Hygiene: Maximal assistance;Sit to/from stand Toileting - Clothing Manipulation Details (indicate cue type and reason): Pt required assistance for rear peri care for throughness, holding onto grab bars for stability     Functional mobility during ADLs: Min guard;Rolling walker;Cueing for safety       Vision       Perception     Praxis      Cognition Arousal/Alertness: Awake/alert Behavior During Therapy: Anxious Overall Cognitive Status: Impaired/Different from baseline Area of Impairment: Safety/judgement;Awareness;Problem solving  Safety/Judgement: Decreased awareness of safety;Decreased awareness of deficits Awareness: Emergent Problem Solving: Requires verbal cues General Comments: Pt continues with impulsivity, cues for safety         Exercises     Shoulder Instructions        General Comments Pt anxious and contemplative about amuptation and what it means for his quality of life in the future. OT provided encouragement and reassurance    Pertinent Vitals/ Pain       Pain Assessment: No/denies pain Pain Intervention(s): Monitored during session  Home Living                                          Prior Functioning/Environment              Frequency  Min 3X/week        Progress Toward Goals  OT Goals(current goals can now be found in the care plan section)  Progress towards OT goals: Progressing toward goals  Acute Rehab OT Goals Patient Stated Goal: get to rehab and get independent OT Goal Formulation: With patient Time For Goal Achievement: 04/02/18 Potential to Achieve Goals: Good  Plan Discharge plan remains appropriate;Frequency remains appropriate    Co-evaluation                 AM-PAC PT "6 Clicks" Daily Activity     Outcome Measure   Help from another person eating meals?: None Help from another person taking care of personal grooming?: A Little Help from another person toileting, which includes using toliet, bedpan, or urinal?: A Lot Help from another person bathing (including washing, rinsing, drying)?: A Lot Help from another person to put on and taking off regular upper body clothing?: A Little Help from another person to put on and taking off regular lower body clothing?: A Lot 6 Click Score: 16    End of Session Equipment Utilized During Treatment: Gait belt;Rolling walker;Other (comment)(Darco shoe)  OT Visit Diagnosis: Unsteadiness on feet (R26.81);Other abnormalities of gait and mobility (R26.89);Muscle weakness (generalized) (M62.81);Pain;Other symptoms and signs involving cognitive function Pain - Right/Left: Left Pain - part of body: Ankle and joints of foot   Activity Tolerance Patient tolerated treatment well   Patient Left in chair;with call bell/phone within reach   Nurse  Communication Mobility status;Weight bearing status;Other (comment)(Condom Cath came off)        Time: 1020-1035 OT Time Calculation (min): 15 min  Charges: OT General Charges $OT Visit: 1 Visit OT Treatments $Self Care/Home Management : 8-22 mins  Kevin [name] OTR/L [place] 03/24/2018, 11:12 AM

## 2018-03-24 NOTE — Progress Notes (Signed)
[place] for apixaban Indication: atrial fibrillation  Labs: Recent Labs    03/22/18 0415 03/23/18 0521 03/24/18 0500  HGB 11.1* 11.4* 10.5*  HCT 35.2* 35.8* 33.2*  PLT 535* 618* 617*  CREATININE 1.36* 1.32* 1.23    Assessment: 66 yom admitted with acute stroke, s/p TPA on 6/20, now with new afib. Pharmacy consulted to start apixaban. Aspirin d/c'd. Not on anticoagulation PTA - currently on SCDs here. Hg down to 10.5, plt wnl - last CBC from 6/28. SCr down to 1.23 on 6/28. No bleed issues documented.  Goal of Therapy:  Stroke prevention Monitor platelets by anticoagulation protocol: Yes   Plan:  Apixaban 5mg  PO BID Monitor CBC, s/sx bleeding  Dwayne A. Levada Dy, PharmD, [place] Pager: [phone]  03/24/2018 7:52 AM

## 2018-03-24 NOTE — Progress Notes (Addendum)
PROGRESS NOTE    Kevin [name]  OZD:[id number] DOB: 07-24-1941 DOA: 03/16/2018 PCP: [name], MD  Brief Narrative:  [name] is a 77 y.o. M with NIDDM, HTN and Ulcerative colitis who presented with left sided hemiparesis.  In the ER, had left sided hemiparesis (LUE>LLE, normal strength on right), subtle left facial droop, resolving expressive aphasia.  Got tPA, admitted to ICU.  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 -hospitalization complicated by diabetic foot ulcer and abscess with exposed bone, status post debridement by orthopedics Dr. 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 Fib and hence aspirin was changed over to Eliquis -PT OT eval completed, CIR was recommended, patient declines this,, will re-assess after Foot surgery  New onset paroxysmal atrial fibrillation CHA2DS2-VASc Score = 6 (age, HTN, DM, Stroke).   -Heart rate much improved -hold Eliquis this evening dose for OR, already got am dose today -Increased metoprolol XL to 150, and Cardizem dose to 240mg  - Cards following, HR improved  Diabetic foot ulcer abscess/osteomyelitis - Infectious disease following, due to exposed bone, being treated as osteomyelitis -status post excisional debridement of skin, subcutaneous tissue, muscle, fascia and bone of left plantar wound by [name] December on 6/21 - surgical cultures with group C streptococcus and  coagulase-negative Staphylococcus - Plan was for 6 weeks of ceftriaxone starting 6/21 -PICC line placed, follow-up with infectious disease and 4 weeks -consulted Orthopedics Dr.Duda, MRI noted abscess/osteo and sinus tract to metatarsal heads, plan for Transmetatarsal amputation per Dr.Duda today, hold next doses of Eliquis till tomorrow -discussed with infectious disease Dr. Baxter Flattery this afternoon, since he will be having transmetatarsal amputation will continue IV antibiotics while inpatient and then transition to oral cephalosporins for 1 week of discharge  Diabetes -Hold sulfonylurea, Januvia, metformin, Invokana - continue Lantus 20 daily  - hemoglobin A1c is 7.6  Hypertension -Continue metoprolol  Ulcerative colitis Mild, not clinically active. -resume Mesalamine at discharge  Hyponatremia Resolved  Acute renal failure  Baseline Cr 0.8, peaked here at 2.2, now down to 1.5.   -Trend BMP daily  DVT prophylaxis: eliquis Code Status: FULL Family Communication: no family at bedside Disposition Plan: to be determined, post op  Consultants:   Critical care  Neurology  Infectious disease  Cardiology  Procedures:   Echocardiogram Study Conclusions  - Left ventricle: The cavity size was normal. Systolic function was   normal. The estimated ejection fraction was in the range of 60%   to 65%. Wall motion was normal; there were no regional wall   motion abnormalities. There was no evidence of elevated   ventricular filling pressure by Doppler parameters. - Left atrium: The atrium was moderately dilated.  Impressions:  - No cardiac source of emboli was indentified    Antimicrobials:   None   Subjective: -tells me that he is having surgery today by Dr.Duda -no chest pain, dyspnea or Neurological symptoms  Objective: Vitals:   03/23/18 2316 03/24/18 0410 03/24/18 0815 03/24/18 1233  BP: (!) 143/93 Marland Kitchen)  160/95 128/75 128/86  Pulse: 86 84 98 90  Resp: 20  18 18 20   Temp: (!) 97.5 F (36.4 C) 98.5 F (36.9 C)  97.7 F (36.5 C)  TempSrc: Oral Oral  Oral  SpO2: 97% 98% 93% 93%  Weight:      Height:        Intake/Output Summary (Last 24 hours) at 03/24/2018 1414 Last data filed at 03/24/2018 0786 Gross per 24 hour  Intake 240 ml  Output 850 ml  Net -610 ml   Filed Weights   03/16/18 2000 03/16/18 2113  Weight: 97.8 kg (215 lb 9.8 oz) 97.8 kg (215 lb 9.8 oz)    Examination: Gen: Awake, Alert, Oriented X 3,  HEENT: PERRLA, Neck supple, no JVD Lungs: Good air movement bilaterally, CTAB CVS: RRR,No Gallops,Rubs or new Murmurs Abd: soft, Non tender, non distended, BS present Extremities: left foot with surgical wound on the plantar surface with gauze, no discharge noted, no evidence of erythema or inflammation around Skin: no new rashes  Data Reviewed: I have personally reviewed following labs and imaging studies:  CBC: Recent Labs  Lab 03/18/18 0254 03/21/18 0508 03/22/18 0415 03/23/18 0521 03/24/18 0500  WBC 19.7* 18.3* 17.4* 18.3* 14.8*  HGB 10.4* 11.0* 11.1* 11.4* 10.5*  HCT 31.8* 34.7* 35.2* 35.8* 33.2*  MCV 89.6 90.4 89.6 90.4 90.0  PLT 249 469* 535* 618* 754*   Basic Metabolic Panel: Recent Labs  Lab 03/18/18 0254 03/19/18 1010 03/21/18 0508 03/22/18 0415 03/23/18 0521 03/24/18 0500  NA 135 132* 139 137 135 139  K 3.5 3.3* 3.2* 4.0 4.7 3.8  CL 101 101 104 105 102 103  CO2 22 22 24 22 25 27   GLUCOSE 103* 230* 153* 171* 212* 183*  BUN 25* 23* 20 17 18 16   CREATININE 1.52* 1.50* 1.46* 1.36* 1.32* 1.23  CALCIUM 7.2* 7.4* 7.8* 7.9* 8.0* 8.2*  MG 2.0 1.9  --   --   --   --   PHOS 3.2  --   --   --   --   --    GFR: Estimated Creatinine Clearance: 64.9 mL/min (by C-G formula based on SCr of 1.23 mg/dL). Liver Function Tests: Recent Labs  Lab 03/18/18 0254  ALBUMIN 2.0*   No results for input(s): LIPASE, AMYLASE in the last 168 hours. No results for input(s): AMMONIA in the last 168 hours. Coagulation  Profile: No results for input(s): INR, PROTIME in the last 168 hours. Cardiac Enzymes: Recent Labs  Lab 03/17/18 1650 03/17/18 2315  TROPONINI 0.14* 0.11*   BNP (last 3 results) No results for input(s): PROBNP in the last 8760 hours. HbA1C: No results for input(s): HGBA1C in the last 72 hours. CBG: Recent Labs  Lab 03/23/18 1145 03/23/18 1651 03/23/18 2103 03/24/18 0816 03/24/18 1129  GLUCAP 202* 153* 203* 169* 201*   Lipid Profile: No results for input(s): CHOL, HDL, LDLCALC, TRIG, CHOLHDL, LDLDIRECT in the last 72 hours. Thyroid Function Tests: No results for input(s): TSH, T4TOTAL, FREET4, T3FREE, THYROIDAB in the last 72 hours. Anemia Panel: No results for input(s): VITAMINB12, FOLATE, FERRITIN, TIBC, IRON, RETICCTPCT in the last 72 hours. Urine analysis:    Component Value Date/Time   COLORURINE YELLOW 03/16/2018 2125   APPEARANCEUR HAZY (A) 03/16/2018 2125   LABSPEC 1.011 03/16/2018 2125   PHURINE 6.0 03/16/2018 2125   GLUCOSEU >=500 (A) 03/16/2018 2125   HGBUR LARGE (A) 03/16/2018 2125   BILIRUBINUR NEGATIVE 03/16/2018 2125   KETONESUR 5 (A) 03/16/2018 2125  PROTEINUR 30 (A) 03/16/2018 2125   NITRITE NEGATIVE 03/16/2018 2125   LEUKOCYTESUR LARGE (A) 03/16/2018 2125   Sepsis Labs: @LABRCNTIP (procalcitonin:4,lacticacidven:4)  ) Recent Results (from the past 240 hour(s))  MRSA PCR Screening     Status: None   Collection Time: 03/16/18 10:37 PM  Result Value Ref Range Status   MRSA by PCR NEGATIVE NEGATIVE Final    Comment:        The GeneXpert MRSA Assay (FDA approved for NASAL specimens only), is one component of a comprehensive MRSA colonization surveillance program. It is not intended to diagnose MRSA infection nor to guide or monitor treatment for MRSA infections. Performed at [facility], [place] [address], [place], [place] [address]   Urine Culture     Status: Abnormal   Collection Time: 03/17/18  8:30 AM  Result Value Ref Range Status    Specimen Description URINE, CLEAN CATCH  Final   Special Requests   Final    NONE Performed at [facility], [place] [address], [place], [place] [address]    Culture >=100,000 COLONIES/mL ENTEROCOCCUS FAECALIS (A)  Final   Report Status 03/19/2018 FINAL  Final   Organism ID, Bacteria ENTEROCOCCUS FAECALIS (A)  Final      Susceptibility   Enterococcus faecalis - MIC*    AMPICILLIN <=2 SENSITIVE Sensitive     LEVOFLOXACIN 1 SENSITIVE Sensitive     NITROFURANTOIN <=16 SENSITIVE Sensitive     VANCOMYCIN 2 SENSITIVE Sensitive     * >=100,000 COLONIES/mL ENTEROCOCCUS FAECALIS  Culture, blood (routine x 2)     Status: None   Collection Time: 03/17/18 11:48 AM  Result Value Ref Range Status   Specimen Description BLOOD RIGHT ANTECUBITAL  Final   Special Requests   Final    BOTTLES DRAWN AEROBIC AND ANAEROBIC Blood Culture adequate volume   Culture   Final    NO GROWTH 5 DAYS Performed at [facility], [place] [address], [place], [place] [address]    Report Status 03/22/2018 FINAL  Final  Aerobic Culture (superficial specimen)     Status: None   Collection Time: 03/17/18 11:51 AM  Result Value Ref Range Status   Specimen Description WOUND LEFT FOOT  Final   Special Requests NONE  Final   Gram Stain   Final    FEW WBC PRESENT,BOTH PMN AND MONONUCLEAR ABUNDANT GRAM POSITIVE COCCI ABUNDANT GRAM NEGATIVE RODS Performed at [facility], [address], [place], [place] [address]    Culture   Final    ABUNDANT STAPHYLOCOCCUS SPECIES (COAGULASE NEGATIVE) ABUNDANT STREPTOCOCCUS GROUP C    Report Status 03/20/2018 FINAL  Final   Organism ID, Bacteria STAPHYLOCOCCUS SPECIES (COAGULASE NEGATIVE)  Final      Susceptibility   Staphylococcus species (coagulase negative) - MIC*    CIPROFLOXACIN <=0.5 SENSITIVE Sensitive     ERYTHROMYCIN <=0.25 SENSITIVE Sensitive     GENTAMICIN 8 INTERMEDIATE Intermediate     OXACILLIN SENSITIVE Sensitive     TETRACYCLINE <=1 SENSITIVE  Sensitive     VANCOMYCIN 1 SENSITIVE Sensitive     TRIMETH/SULFA <=10 SENSITIVE Sensitive     CLINDAMYCIN <=0.25 SENSITIVE Sensitive     RIFAMPIN <=0.5 SENSITIVE Sensitive     Inducible Clindamycin NEGATIVE Sensitive     * ABUNDANT STAPHYLOCOCCUS SPECIES (COAGULASE NEGATIVE)  Culture, blood (routine x 2)     Status: None   Collection Time: 03/17/18 11:55 AM  Result Value Ref Range Status   Specimen Description BLOOD  LEFT ANTECUBITAL  Final   Special Requests   Final    BOTTLES DRAWN AEROBIC AND ANAEROBIC Blood Culture adequate volume   Culture   Final    NO GROWTH 5 DAYS Performed at [facility], [address] [place], [place] [address]    Report Status 03/22/2018 FINAL  Final  Anaerobic culture     Status: None   Collection Time: 03/17/18  4:15 PM  Result Value Ref Range Status   Specimen Description WOUND LEFT FOOT  Final   Special Requests NONE  Final   Culture   Final    MODERATE PREVOTELLA MELANINOGENICA BETA LACTAMASE POSITIVE Performed at [facility], [place] [address] [place], [place] [address]    Report Status 03/24/2018 FINAL  Final  Aerobic/Anaerobic Culture (surgical/deep wound)     Status: None   Collection Time: 03/17/18  8:28 PM  Result Value Ref Range Status   Specimen Description ABSCESS LEFT FOOT  Final   Special Requests PATIENT ON FOLLOWING ZOSYN VANC  Final   Gram Stain   Final    MODERATE WBC PRESENT, PREDOMINANTLY PMN MODERATE GRAM POSITIVE COCCI FEW GRAM NEGATIVE RODS    Culture   Final    MODERATE STREPTOCOCCUS GROUP C MODERATE PREVOTELLA MELANINOGENICA BETA LACTAMASE POSITIVE Performed at [facility], [place] [address], [place], [place] [address]    Report Status 03/22/2018 FINAL  Final  Surgical pcr screen     Status: None   Collection Time: 03/23/18  9:20 PM  Result Value Ref Range Status   MRSA, PCR NEGATIVE NEGATIVE Final   Staphylococcus aureus NEGATIVE NEGATIVE Final    Comment: (NOTE) The Xpert SA Assay (FDA  approved for NASAL specimens in patients 24 years of age and older), is one component of a comprehensive surveillance program. It is not intended to diagnose infection nor to guide or monitor treatment. Performed at [facility], [place] [address], [place],  [address]          Radiology Studies: Mr Foot Left W Wo Contrast  Result Date: 03/23/2018 CLINICAL DATA:  Plantar foot ulcer at the base of the toes with exposed bone. Evaluate for osteomyelitis. EXAM: MRI OF THE LEFT FOREFOOT WITHOUT AND WITH CONTRAST TECHNIQUE: Multiplanar, multisequence MR imaging of the left forefoot was performed both before and after administration of intravenous contrast. CONTRAST:  47mL MULTIHANCE GADOBENATE DIMEGLUMINE 529 MG/ML IV SOLN COMPARISON:  Left foot x-rays dated March 17, 2018. FINDINGS: Bones/Joint/Cartilage There is marrow edema within the second metatarsal head with cortical irregularity and small erosions, consistent with early osteomyelitis. Additional faint marrow edema within the second proximal phalanx, second middle phalanx, third proximal phalanx, and fourth metatarsal head with preserved T1 marrow signal, likely reactive. Dorsal dislocation of the second proximal phalanx with respect to the second metatarsal. Old healed fracture of the distal fifth metatarsal shaft. Normal alignment. No joint effusion. 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 Mild osteoarthritis of the first MTP joint and second tarsometatarsal joint. Chronic juxtacortical erosion along the medial aspect of the third metatarsal head, likely related to gout. Ligaments The collateral ligaments at the second MTP joint are completely torn remaining collateral ligaments are intact. Muscles and Tendons Flexor, peroneal and extensor compartment tendons are intact. Small amount of fluid and enhancement surrounding the second flexor tendons at the level of the distal second metatarsal. Severe fatty atrophy of the  intrinsic muscles of the forefoot. Soft tissue Soft tissue ulceration at the plantar  base of the second metatarsal head with a sinus tract extending to the bone. There is phlegmon and small rim enhancing fluid surrounding the dislocated second MTP joint extending into the first and second intermetatarsal spaces and superior to the second extensor tendon at the base of the proximal phalanx. No soft tissue mass. IMPRESSION: 1. Soft tissue ulceration at the plantar base of the second metatarsal head with sinus tract extending to bone. Dorsal dislocation of the second MTP joint with prominent phlegmon and small abscess surrounding the dislocated joint, extending into the first and second intermetatarsal spaces and superior to the second extensor tendons at the base of the proximal phalanx. 2. 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 3. Small amount of fluid and enhancement surrounding the second flexor tendons at the level of the distal second metatarsal, concerning for infectious tenosynovitis. Electronically Signed   By: Titus Dubin M.D.   On: 03/23/2018 14:35        Scheduled Meds: . apixaban  5 mg Oral BID  . diltiazem  240 mg Oral Daily  . docusate sodium  100 mg Oral BID  . famotidine  20 mg Oral BID  . FLUoxetine  20 mg Oral Daily  . insulin aspart  0-15 Units Subcutaneous TID WC  . insulin aspart  0-5 Units Subcutaneous QHS  . insulin glargine  20 Units Subcutaneous QHS  . metoprolol succinate  150 mg Oral Daily  . metoprolol succinate  50 mg Oral Once  . rosuvastatin  5 mg Oral q1800  . sodium chloride flush  10-40 mL Intracatheter Q12H   Continuous Infusions: . sodium chloride Stopped (03/18/18 1556)  . cefTRIAXone (ROCEPHIN)  IV 2 g (03/24/18 0858)     LOS: 8 days    Time spent: 25 minutes    Domenic Polite, MD [facility] 03/24/2018, 2:14 PM     Page via [email or web address] Password [email or web address] If 7PM-7AM, please contact night-coverage

## 2018-03-24 NOTE — Consult Note (Signed)
ORTHOPAEDIC CONSULTATION  REQUESTING PHYSICIAN: Domenic Polite, MD  Chief Complaint: Abscess ulceration [name] beneath the second metatarsal head.  HPI: Kevin [name] is a 77 y.o. male who presents with abscess ulceration [name] second metatarsal head.  He is status post irrigation and debridement of this ulcer about a week ago.  He still has persistent ulceration and drainage and cellulitis.  Past Medical History:  Diagnosis Date  . Diabetes mellitus without complication ([place])   . Hypertension    Past Surgical History:  Procedure Laterality Date  . I&D EXTREMITY [name] 03/17/2018   Procedure: IRRIGATION AND DEBRIDEMENT FOOT;  Surgeon: [name], MD;  Location: [facility];  Service: Orthopedics;  Laterality: [name];  . WRIST SURGERY     Social History   Socioeconomic History  . Marital status: Married    Spouse name: Not on file  . Number of children: Not on file  . Years of education: Not on file  . Highest education level: Not on file  Occupational History  . Not on file  Social Needs  . Financial resource strain: Not on file  . Food insecurity:    Worry: Not on file    Inability: Not on file  . Transportation needs:    Medical: Not on file    Non-medical: Not on file  Tobacco Use  . Smoking status: Never Smoker  . Smokeless tobacco: Never Used  Substance and Sexual Activity  . Alcohol use: Yes  . Drug use: Never  . Sexual activity: Not on file  Lifestyle  . Physical activity:    Days per week: Not on file    Minutes per session: Not on file  . Stress: Not on file  Relationships  . Social connections:    Talks on phone: Not on file    Gets together: Not on file    Attends religious service: Not on file    Active member of club or organization: Not on file    Attends meetings of clubs or organizations: Not on file    Relationship status: Not on file  Other Topics Concern  . Not on file  Social History Narrative  . Not on file    Family History  Problem Relation Age of Onset  . Hypertension Father    - negative except otherwise stated in the family history section Allergies  Allergen Reactions  . Sulfa Antibiotics Rash   Prior to Admission medications   Medication Sig Start Date End Date Taking? Authorizing Provider  aspirin EC 81 MG tablet Take 81 mg by mouth daily.   Yes [provider]  canagliflozin (INVOKANA) 100 MG TABS tablet Take 100 mg by mouth daily before breakfast.   Yes [provider]  FLUoxetine (PROZAC) 20 MG capsule Take 20 mg by mouth daily.   Yes [provider]  glimepiride (AMARYL) 4 MG tablet Take 2 mg by mouth daily with breakfast.   Yes [provider]  mesalamine (APRISO) 0.375 g 24 hr capsule Take 750 mg by mouth 2 (two) times daily.    Yes [provider]  metFORMIN (GLUCOPHAGE) 500 MG tablet Take 1,000 mg by mouth 2 (two) times daily with a meal.    Yes [provider]  metoprolol succinate (TOPROL-XL) 100 MG 24 hr tablet Take 100 mg by mouth daily. Take with or immediately following a meal.   Yes [provider]  Multiple Vitamin (MULTIVITAMIN WITH MINERALS) TABS tablet Take 1 tablet by mouth  daily.   Yes [provider]  rosuvastatin (CRESTOR) 10 MG tablet Take 10 mg by mouth daily.   Yes [provider]  sitaGLIPtin (JANUVIA) 100 MG tablet Take 100 mg by mouth daily.   Yes [provider]  tamsulosin (FLOMAX) 0.4 MG CAPS capsule Take 0.4 mg by mouth daily after breakfast.  02/27/18  Yes [provider]   Kevin [name] W Wo Contrast  Result Date: 03/23/2018 CLINICAL DATA:  Plantar foot ulcer at the base of the toes with exposed bone. Evaluate for osteomyelitis. EXAM: MRI OF THE [name] FOREFOOT WITHOUT AND WITH CONTRAST TECHNIQUE: Multiplanar, multisequence Kevin imaging of the [name] forefoot was performed both before and after administration of intravenous contrast. CONTRAST:  63mL MULTIHANCE  GADOBENATE DIMEGLUMINE 529 MG/ML IV SOLN COMPARISON:  [name] x-rays dated March 17, 2018. FINDINGS: Bones/Joint/Cartilage There is marrow edema within the second metatarsal head with cortical irregularity and small erosions, consistent with early osteomyelitis. Additional faint marrow edema within the second proximal phalanx, second middle phalanx, third proximal phalanx, and fourth metatarsal head with preserved T1 marrow signal, likely reactive. Dorsal dislocation of the second proximal phalanx with respect to the second metatarsal. Old healed fracture of the distal fifth metatarsal shaft. Normal alignment. No joint effusion. 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 Mild osteoarthritis of the first MTP joint and second tarsometatarsal joint. Chronic juxtacortical erosion along the medial aspect of the third metatarsal head, likely related to gout. Ligaments The collateral ligaments at the second MTP joint are completely torn remaining collateral ligaments are intact. Muscles and Tendons Flexor, peroneal and extensor compartment tendons are intact. Small amount of fluid and enhancement surrounding the second flexor tendons at the level of the distal second metatarsal. Severe fatty atrophy of the intrinsic muscles of the forefoot. Soft tissue Soft tissue ulceration at the plantar base of the second metatarsal head with a sinus tract extending to the bone. There is phlegmon and small rim enhancing fluid surrounding the dislocated second MTP joint extending into the first and second intermetatarsal spaces and superior to the second extensor tendon at the base of the proximal phalanx. No soft tissue mass. IMPRESSION: 1. Soft tissue ulceration at the plantar base of the second metatarsal head with sinus tract extending to bone. Dorsal dislocation of the second MTP joint with prominent phlegmon and small abscess surrounding the dislocated joint, extending into the first and second intermetatarsal  spaces and superior to the second extensor tendons at the base of the proximal phalanx. 2. 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 3. Small amount of fluid and enhancement surrounding the second flexor tendons at the level of the distal second metatarsal, concerning for infectious tenosynovitis. Electronically Signed   By: Titus Dubin M.D.   On: 03/23/2018 14:35   - pertinent xrays, CT, MRI studies were reviewed and independently interpreted  Positive ROS: All other systems have been reviewed and were otherwise negative with the exception of those mentioned in the HPI and as above.  Physical Exam: General: Alert, no acute distress Psychiatric: Patient is competent for consent with normal mood and affect Lymphatic: No axillary or cervical lymphadenopathy Cardiovascular: No pedal edema Respiratory: No cyanosis, no use of accessory musculature GI: No organomegaly, abdomen is soft and non-tender    Images:  @ENCIMAGES @  Labs:  Lab Results  Component Value Date   HGBA1C 7.6 (H) 03/17/2018   REPTSTATUS 03/22/2018 FINAL 03/17/2018   GRAMSTAIN  03/17/2018    MODERATE WBC PRESENT, PREDOMINANTLY PMN  MODERATE GRAM POSITIVE COCCI FEW GRAM NEGATIVE RODS    CULT  03/17/2018    MODERATE STREPTOCOCCUS GROUP C MODERATE PREVOTELLA MELANINOGENICA BETA LACTAMASE POSITIVE Performed at [facility], [place] [address], [place], [place] [address]    LABORGA STAPHYLOCOCCUS SPECIES (COAGULASE NEGATIVE) 03/17/2018    Lab Results  Component Value Date   ALBUMIN 2.0 (L) 03/18/2018   ALBUMIN 2.6 (L) 03/17/2018   ALBUMIN 2.7 (L) 03/16/2018    Neurologic: Patient does not have protective sensation bilateral lower extremities.   MUSCULOSKELETAL:   Skin: Examination there is cellulitis and ulceration on the [name] beneath the second metatarsal head.  The second metatarsal head is exposed and palpable within the wound.  Patient has a palpable dorsalis pedis pulse.  The MRI  scan is reviewed which shows an abscess that extends to the first metatarsal head and the third metatarsal head with osteomyelitis of the second metatarsal head the ulcer can be seen extending down to the bone and the bone is palpable within the wound.  Assessment: Assessment: Ulceration abscess osteomyelitis [name] forefoot.  Plan: Plan: Will plan for a transmetatarsal amputation.  Risks and benefits were discussed including the risk of the wound not healing.  Patient states he understands wished to proceed with surgery at this time will plan for a incisional wound VAC as well.  Plan for surgery today Friday afternoon.  N.p.o. today.  Thank you for the consult and the opportunity to see Kevin. Tajh Livsey, [place] [phone] 8:28 AM

## 2018-03-24 NOTE — Transfer of Care (Signed)
Immediate Anesthesia Transfer of Care Note  Patient: Kevin [name]  Procedure(s) Performed: Transmetatarsal Amputation Left Foot (Left Foot)  Patient Location: PACU  Anesthesia Type:MAC combined with regional for post-op pain  Level of Consciousness: awake, alert , oriented and patient cooperative  Airway & Oxygen Therapy: Patient Spontanous Breathing and Patient connected to face mask oxygen  Post-op Assessment: Report given to RN and Post -op Vital signs reviewed and stable  Post vital signs: Reviewed and stable  Last Vitals:  Vitals Value Taken Time  BP 121/78 03/24/2018  4:47 PM  Temp    Pulse 71 03/24/2018  4:49 PM  Resp 15 03/24/2018  4:49 PM  SpO2 99 % 03/24/2018  4:49 PM  Vitals shown include unvalidated device data.  Last Pain:  Vitals:   03/24/18 1510  TempSrc:   PainSc: 0-No pain      Patients Stated Pain Goal: 0 (58/68/25 7493)  Complications: No apparent anesthesia complications

## 2018-03-24 NOTE — Op Note (Signed)
     Date of Surgery: 03/24/2018  INDICATIONS: Kevin [name] is a 77 y.o.-year-old male who has undergone previous surgical debridements with osteomyelitis of the second metatarsal with an abscess extending to the first and third metatarsal.  He has failed conservative treatment including IV antibiotics.Marland Kitchen  PREOPERATIVE DIAGNOSIS: Abscess osteomyelitis left forefoot  POSTOPERATIVE DIAGNOSIS: Same.  PROCEDURE: Transmetatarsal amputation Application of Prevena wound VAC  SURGEON: Sharol Given, M.D.  ANESTHESIA:  general  IV FLUIDS AND URINE: See anesthesia.  ESTIMATED BLOOD LOSS: Minimal mL.  COMPLICATIONS: None.  DESCRIPTION OF PROCEDURE: The patient was brought to the operating room and underwent a general anesthetic. After adequate levels of anesthesia were obtained patient's lower extremity was prepped using DuraPrep draped into a sterile field. A timeout was called.  A fishmouth incision was made just proximal to the ulcerative nonviable tissue. This was carried sharply down to bone. A oscillating saw was used to perform a transmetatarsal amputation with a gentle cascade of the metatarsals and beveled plantarly. Electrocautery was used for hemostasis. The wound was irrigated with normal saline. The incision was closed using 2-0 nylon. A Prevena wound VAC was applied. This had a good suction fit. Patient was taken to the PACU in stable condition.   DISCHARGE PLANNING:  Antibiotic duration: Continue IV antibiotics for 24 hours postoperatively  Weightbearing: Touchdown weightbearing on the left  Pain medication: Low-dose opiate pathway ordered  Dressing care/ Wound VAC: Continue wound VAC for 1 week  Discharge to: Anticipate discharge to home  Follow-up: In the office 1 week post operative.  Meridee Score, MD [place] 4:05 PM

## 2018-03-24 NOTE — Progress Notes (Signed)
Progress Note  Patient Name: Kevin [name] Date of Encounter: 03/24/2018  Primary Cardiologist: Dr. [name]  Subjective   Patient has no complaints.  He was waiting for his transmetatarsal amputation later today.  Inpatient Medications    Scheduled Meds: . apixaban  5 mg Oral BID  . diltiazem  240 mg Oral Daily  . docusate sodium  100 mg Oral BID  . famotidine  20 mg Oral BID  . FLUoxetine  20 mg Oral Daily  . insulin aspart  0-15 Units Subcutaneous TID WC  . insulin aspart  0-5 Units Subcutaneous QHS  . insulin glargine  20 Units Subcutaneous QHS  . metoprolol succinate  150 mg Oral Daily  . metoprolol succinate  50 mg Oral Once  . rosuvastatin  5 mg Oral q1800  . sodium chloride flush  10-40 mL Intracatheter Q12H   Continuous Infusions: . sodium chloride Stopped (03/18/18 1556)  . cefTRIAXone (ROCEPHIN)  IV Stopped (03/23/18 1900)   PRN Meds: sodium chloride, [DISCONTINUED] acetaminophen **OR** [DISCONTINUED] acetaminophen (TYLENOL) oral liquid 160 mg/5 mL **OR** acetaminophen, metoprolol tartrate, ondansetron **OR** ondansetron (ZOFRAN) IV, oxyCODONE **OR** oxyCODONE, senna-docusate, sodium chloride flush   Vital Signs    Vitals:   03/23/18 2010 03/23/18 2316 03/24/18 0410 03/24/18 0815  BP: (!) 142/70 (!) 143/93 (!) 160/95 128/75  Pulse: 82 86 84 98  Resp: 20 20 18 18   Temp: (!) 97.4 F (36.3 C) (!) 97.5 F (36.4 C) 98.5 F (36.9 C)   TempSrc: Oral Oral Oral   SpO2: 98% 97% 98% 93%  Weight:      Height:        Intake/Output Summary (Last 24 hours) at 03/24/2018 0829 Last data filed at 03/24/2018 0177 Gross per 24 hour  Intake 240 ml  Output 1000 ml  Net -760 ml    I/O since admission:   Filed Weights   03/16/18 2000 03/16/18 2113  Weight: 215 lb 9.8 oz (97.8 kg) 215 lb 9.8 oz (97.8 kg)    Telemetry    - Personally Reviewed.  Remained in A. fib with heart rate mostly between 90-110.  ECG    ECG (independently read by me): No EKG  today.  Physical Exam    BP 128/75 (BP Location: Left Arm)   Pulse 98   Temp 98.5 F (36.9 C) (Oral)   Resp 18   Ht 6\' 1"  (1.854 m)   Wt 215 lb 9.8 oz (97.8 kg)   SpO2 93%   BMI 28.45 kg/m  General: Alert, oriented, no distress.  Skin: normal turgor, no rashes, warm and dry HEENT: Normocephalic, atraumatic.  Neck: No JVD, no carotid bruits; Lungs: clear to ausculatation and percussion; no wheezing or rales Chest wall: without tenderness to palpitation Heart: Irregularly irregular with mild tachycardia. Abdomen: soft, nontender; no hepatosplenomehaly, BS+; Pulses 2+ Musculoskeletal: full range of motion, normal strength, no joint deformities Extremities: Left foot edema and erythema. Psychologic: Normal mood and affect   Labs    Chemistry Recent Labs  Lab 03/17/18 1148 03/18/18 0254  03/22/18 0415 03/23/18 0521 03/24/18 0500  NA 126* 135   < > 137 135 139  K 3.9 3.5   < > 4.0 4.7 3.8  CL 89* 101   < > 105 102 103  CO2 24 22   < > 22 25 27   GLUCOSE 292* 103*   < > 171* 212* 183*  BUN 32* 25*   < > 17 18 16   CREATININE 1.71* 1.52*   < >  1.36* 1.32* 1.23  CALCIUM 8.2* 7.2*   < > 7.9* 8.0* 8.2*  ALBUMIN 2.6* 2.0*  --   --   --   --   GFRNONAA 38* 43*   < > 50* 51* 56*  GFRAA 44* 50*   < > 58* 60* >60  ANIONGAP 13 12   < > 10 8 9    < > = values in this interval not displayed.     Hematology Recent Labs  Lab 03/22/18 0415 03/23/18 0521 03/24/18 0500  WBC 17.4* 18.3* 14.8*  RBC 3.93* 3.96* 3.69*  HGB 11.1* 11.4* 10.5*  HCT 35.2* 35.8* 33.2*  MCV 89.6 90.4 90.0  MCH 28.2 28.8 28.5  MCHC 31.5 31.8 31.6  RDW 13.0 13.0 13.0  PLT 535* 618* 617*    Cardiac Enzymes Recent Labs  Lab 03/17/18 1148 03/17/18 1650 03/17/18 2315  TROPONINI 0.15* 0.14* 0.11*   No results for input(s): TROPIPOC in the last 168 hours.   BNP Recent Labs  Lab 03/17/18 1148  BNP 358.5*     DDimer No results for input(s): DDIMER in the last 168 hours.   Lipid Panel       Component Value Date/Time   CHOL 75 03/17/2018 0339   TRIG 114 03/17/2018 0339   HDL 13 (L) 03/17/2018 0339   CHOLHDL 5.8 03/17/2018 0339   VLDL 23 03/17/2018 0339   LDLCALC 39 03/17/2018 0339    Radiology    Mr Foot Left W Wo Contrast  Result Date: 03/23/2018 CLINICAL DATA:  Plantar foot ulcer at the base of the toes with exposed bone. Evaluate for osteomyelitis. EXAM: MRI OF THE LEFT FOREFOOT WITHOUT AND WITH CONTRAST TECHNIQUE: Multiplanar, multisequence MR imaging of the left forefoot was performed both before and after administration of intravenous contrast. CONTRAST:  71mL MULTIHANCE GADOBENATE DIMEGLUMINE 529 MG/ML IV SOLN COMPARISON:  Left foot x-rays dated March 17, 2018. FINDINGS: Bones/Joint/Cartilage There is marrow edema within the second metatarsal head with cortical irregularity and small erosions, consistent with early osteomyelitis. Additional faint marrow edema within the second proximal phalanx, second middle phalanx, third proximal phalanx, and fourth metatarsal head with preserved T1 marrow signal, likely reactive. Dorsal dislocation of the second proximal phalanx with respect to the second metatarsal. Old healed fracture of the distal fifth metatarsal shaft. Normal alignment. No joint effusion. 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 Mild osteoarthritis of the first MTP joint and second tarsometatarsal joint. Chronic juxtacortical erosion along the medial aspect of the third metatarsal head, likely related to gout. Ligaments The collateral ligaments at the second MTP joint are completely torn remaining collateral ligaments are intact. Muscles and Tendons Flexor, peroneal and extensor compartment tendons are intact. Small amount of fluid and enhancement surrounding the second flexor tendons at the level of the distal second metatarsal. Severe fatty atrophy of the intrinsic muscles of the forefoot. Soft tissue Soft tissue ulceration at the plantar base of the  second metatarsal head with a sinus tract extending to the bone. There is phlegmon and small rim enhancing fluid surrounding the dislocated second MTP joint extending into the first and second intermetatarsal spaces and superior to the second extensor tendon at the base of the proximal phalanx. No soft tissue mass. IMPRESSION: 1. Soft tissue ulceration at the plantar base of the second metatarsal head with sinus tract extending to bone. Dorsal dislocation of the second MTP joint with prominent phlegmon and small abscess surrounding the dislocated joint, extending into the first and second intermetatarsal spaces and  superior to the second extensor tendons at the base of the proximal phalanx. 2. 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 3. Small amount of fluid and enhancement surrounding the second flexor tendons at the level of the distal second metatarsal, concerning for infectious tenosynovitis. Electronically Signed   By: Titus Dubin M.D.   On: 03/23/2018 14:35    Cardiac Studies   Echocardiogram - Left ventricle: The cavity size was normal. Systolic function was normal. The estimated ejection fraction was in the range of 60% to 65%. Wall motion was normal; there were no regional wall motion abnormalities. There was no evidence of elevated ventricular filling pressure by Doppler parameters. - Left atrium: The atrium was moderately dilated.  Impressions:  - No cardiac source of emboli was indentified.  Patient Profile     77 y.o. male with PMH of DM and HTN who presented with left sided weakness and facial droop. Found to haveacute right hemispheric strokeandtreated with IV tPA. He has a tunneling wound in the left foot and likely sepsis related to that. On abx.   Cardiology is asked for management of PVCs and sinus tachycardia. Started on BB. Echo with normal LVEF.No cardiac source of emboli was indentified.   Assessment & Plan    New onset atrial  fibrillation.Patient remained in A. fib with mild RVR. CHADVASC of 6, on Eliquis .  Heart rate mostly remained around 100, improved after adding Cardizem.  Blood pressure stable. We will try current management if patient remained in A. fib can be considered for an elective cardioversion. -Continue Cardizem240mg  daily. -Continue Toprol-XL 150 mg daily. -Continue Eliquis. -Keep monitoring.  Acute CVA. - per neurology.  Osteomyelitis of left foot.ID is following on IV antibiotics through PICC line.   MRI with continuation of abscess and osteomyelitis. Going for his transmetatarsal amputation later today.  Rollene Rotunda MD PGY2  03/24/2018, 8:29 AM

## 2018-03-24 NOTE — Anesthesia Procedure Notes (Signed)
Anesthesia Regional Block: Popliteal block   Pre-Anesthetic Checklist: ,, timeout performed, Correct Patient, Correct Site, Correct Laterality, Correct Procedure, Correct Position, site marked, Risks and benefits discussed,  Surgical consent,  Pre-op evaluation,  At surgeon's request and post-op pain management  Laterality: Left  Prep: chloraprep       Needles:  Injection technique: Single-shot  Needle Type: Stimiplex     Needle Length: 10cm  Needle Gauge: 21     Additional Needles:   Procedures:,,,, ultrasound used (permanent image in chart),,,,  Motor weakness within 5 minutes.   Nerve Stimulator or Paresthesia:  Response: 0.5 mA,   Additional Responses:   Narrative:  Start time: 03/24/2018 3:10 PM End time: 03/24/2018 3:12 PM Injection made incrementally with aspirations every 5 mL.  Performed by: Personally  Anesthesiologist: Nolon Nations, MD  Additional Notes: Nerve located and needle positioned with direct ultrasound guidance. Good perineural spread. Patient tolerated well.

## 2018-03-24 NOTE — Progress Notes (Signed)
Physical Therapy Treatment Patient Details Name: Kevin [name] MRN: [id number] DOB: 10-Jun-1941 Today's Date: 03/24/2018    History of Present Illness  Kevin [name] is a 77 y.o.-year-old male with a left diabetic foot ulcer with abscess.  He was admitted to the neuro ICU following  stroke like symptoms 03/16/18.  He was noted to have a left plantar foot ulcer.  During the course of the day 6/21 he was decompensating and becoming septic.  The wound was foul-smelling and concern was that the source of his sepsis was the infected foot.  Underwent I&D left foot ulcer 03/17/18.   Stroke symptoms seem to have resolved.      PT Comments    Patient is making progress toward PT goals and tolerated session. Current plan remains appropriate.    Follow Up Recommendations  Supervision/Assistance - 24 hour;CIR     Equipment Recommendations  Rolling walker with 5" wheels;3in1 (PT)    Recommendations for Other Services       Precautions / Restrictions Precautions Precautions: Fall Restrictions Weight Bearing Restrictions: Yes LLE Weight Bearing: Partial weight bearing LLE Partial Weight Bearing Percentage or Pounds: heel Other Position/Activity Restrictions: has Darco shoe in room    Mobility  Bed Mobility Overal bed mobility: Needs Assistance Bed Mobility: Supine to Sit     Supine to sit: Supervision     General bed mobility comments: supervision for safety  Transfers Overall transfer level: Needs assistance Equipment used: Rolling walker (2 wheeled) Transfers: Sit to/from Stand Sit to Stand: Min assist         General transfer comment: min A for steadying assist and cues for safe hand placement  Ambulation/Gait Ambulation/Gait assistance: Min assist  Gait distance: 100 Assistive device: Rolling walker (2 wheeled) Gait Pattern/deviations: Step-to pattern;Decreased stance time - left;Decreased step length - right;Decreased weight shift to left Gait velocity: decreased    General Gait Details: cues for weigth bearing status, posture, and safe proximity to RW; assist for balance   Stairs             Wheelchair Mobility    Modified Rankin (Stroke Patients Only)       Balance Overall balance assessment: Needs assistance Sitting-balance support: No upper extremity supported;Feet supported Sitting balance-Leahy Scale: Good     Standing balance support: Bilateral upper extremity supported;During functional activity Standing balance-Leahy Scale: Fair Standing balance comment: able to perform washing hands at sink in standing position without LOB                            Cognition Arousal/Alertness: Awake/alert Behavior During Therapy: Anxious Overall Cognitive Status: Impaired/Different from baseline Area of Impairment: Safety/judgement;Awareness;Problem solving                         Safety/Judgement: Decreased awareness of safety;Decreased awareness of deficits Awareness: Emergent Problem Solving: Requires verbal cues General Comments: Pt continues with impulsivity, cues for safety       Exercises      General Comments General comments (skin integrity, edema, etc.): Pt anxious and contemplative about amuptation and what it means for his quality of life in the future. OT provided encouragement and reassurance      Pertinent Vitals/Pain Pain Assessment: No/denies pain Pain Intervention(s): Monitored during session    Home Living                      Prior Function  PT Goals (current goals can now be found in the care plan section) Acute Rehab PT Goals Patient Stated Goal: get to rehab and get independent Progress towards PT goals: Progressing toward goals    Frequency    Min 3X/week      PT Plan Current plan remains appropriate    Co-evaluation              AM-PAC PT "6 Clicks" Daily Activity  Outcome Measure  Difficulty turning over in bed (including adjusting  bedclothes, sheets and blankets)?: None Difficulty moving from lying on back to sitting on the side of the bed? : A Little Difficulty sitting down on and standing up from a chair with arms (e.g., wheelchair, bedside commode, etc,.)?: Unable Help needed moving to and from a bed to chair (including a wheelchair)?: A Little Help needed walking in hospital room?: A Little Help needed climbing 3-5 steps with a railing? : A Lot 6 Click Score: 16    End of Session Equipment Utilized During Treatment: Gait belt(Darco shoe) Activity Tolerance: Patient tolerated treatment well Patient left: with call bell/phone within reach;Other (comment)(OT present) Nurse Communication: Mobility status PT Visit Diagnosis: Other abnormalities of gait and mobility (R26.89);Unsteadiness on feet (R26.81);Pain Pain - Right/Left: Right Pain - part of body: Ankle and joints of foot     Time: 4356-8616 PT Time Calculation (min) (ACUTE ONLY): 24 min  Charges:  $Gait Training: 8-22 mins $Therapeutic Activity: 8-22 mins                    G Codes:       Kevin [name], PTA Pager: [phone]     Kevin [name] 03/24/2018, 1:26 PM

## 2018-03-24 NOTE — Anesthesia Procedure Notes (Signed)
Anesthesia Regional Block: Adductor canal block   Pre-Anesthetic Checklist: ,, timeout performed, Correct Patient, Correct Site, Correct Laterality, Correct Procedure, Correct Position, site marked, Risks and benefits discussed,  Surgical consent,  Pre-op evaluation,  At surgeon's request and post-op pain management  Laterality: Left  Prep: chloraprep       Needles:  Injection technique: Single-shot  Needle Type: Stimiplex     Needle Length: 9cm  Needle Gauge: 21     Additional Needles:   Procedures:,,,, ultrasound used (permanent image in chart),,,,  Narrative:  Start time: 03/24/2018 3:08 PM End time: 03/24/2018 3:10 PM Injection made incrementally with aspirations every 5 mL.  Performed by: Personally  Anesthesiologist: Nolon Nations, MD  Additional Notes: BP cuff, EKG monitors applied. Sedation begun. Artery and nerve location verified with U/S and anesthetic injected incrementally, slowly, and after negative aspirations under direct u/s guidance. Good fascial /perineural spread. Tolerated well.

## 2018-03-24 NOTE — Progress Notes (Signed)
Inpatient Rehabilitation Admissions Coordinator  Noted plans for surgery today. I will follow up on Monday.  Danne Baxter, RN, MSN Rehab Admissions Coordinator [phone] 03/24/2018 9:13 AM

## 2018-03-24 NOTE — Plan of Care (Signed)
Mr. Ostrand was, understandably, anxious about his procedure today.  I believe that he was not expecting an amputation.  Nevertheless, the procedure was completed and patient is back in his room resting comfortably with family.  The patient has a wound vac on his left foot, is in minimal pain, has voided and eating/drinking normally.    We continue to support this patient in pain relief, progressive mobility, wound healing and psychosocial support.

## 2018-03-25 DIAGNOSIS — I4891 Unspecified atrial fibrillation: Secondary | ICD-10-CM

## 2018-03-25 LAB — CBC
HCT: 33.2 % — ABNORMAL LOW (ref 39.0–52.0)
Hemoglobin: 10.5 g/dL — ABNORMAL LOW (ref 13.0–17.0)
MCH: 28.5 pg (ref 26.0–34.0)
MCHC: 31.6 g/dL (ref 30.0–36.0)
MCV: 90.2 fL (ref 78.0–100.0)
Platelets: 621 10*3/uL — ABNORMAL HIGH (ref 150–400)
RBC: 3.68 MIL/uL — ABNORMAL LOW (ref 4.22–5.81)
RDW: 13.1 % (ref 11.5–15.5)
WBC: 15.9 10*3/uL — ABNORMAL HIGH (ref 4.0–10.5)

## 2018-03-25 LAB — GLUCOSE, CAPILLARY
Glucose-Capillary: 164 mg/dL — ABNORMAL HIGH (ref 70–99)
Glucose-Capillary: 232 mg/dL — ABNORMAL HIGH (ref 70–99)
Glucose-Capillary: 204 mg/dL — ABNORMAL HIGH (ref 70–99)
Glucose-Capillary: 253 mg/dL — ABNORMAL HIGH (ref 70–99)

## 2018-03-25 LAB — BASIC METABOLIC PANEL
Anion gap: 6 (ref 5–15)
BUN: 12 mg/dL (ref 8–23)
CO2: 29 mmol/L (ref 22–32)
Calcium: 8.2 mg/dL — ABNORMAL LOW (ref 8.9–10.3)
Chloride: 103 mmol/L (ref 98–111)
Creatinine, Ser: 1.22 mg/dL (ref 0.61–1.24)
GFR calc Af Amer: >60 mL/min (ref >60)
GFR calc non Af Amer: 57 mL/min — ABNORMAL LOW (ref >60)
Glucose, Bld: 171 mg/dL — ABNORMAL HIGH (ref 70–99)
Potassium: 3.9 mmol/L (ref 3.5–5.1)
Sodium: 138 mmol/L (ref 135–145)

## 2018-03-25 NOTE — Progress Notes (Signed)
PROGRESS NOTE    Kevin [name]  JYN:[id number] DOB: 1941/09/03 DOA: 03/16/2018 PCP: [name], MD  Brief Narrative:  [name] is a 77 y.o. M with NIDDM, HTN and Ulcerative colitis who presented with left sided hemiparesis.  In the ER, had left sided hemiparesis (LUE>LLE, normal strength on right), subtle left facial droop, resolving expressive aphasia.  Got tPA, admitted to ICU.  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 -hospitalization complicated by diabetic foot ulcer and abscess with exposed bone, status post debridement by orthopedics Dr. 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 Fib and hence aspirin was changed over to Eliquis -PT OT eval completed, CIR was recommended, patient hesitant to consider this, will re-assess post op  New onset paroxysmal atrial fibrillation CHA2DS2-VASc Score = 6 (age, HTN, DM, Stroke).   -Heart rate much improved -resumed eliquis -Increased metoprolol XL to 150, and Cardizem dose to 240mg  - Cards following, HR improved  Diabetic foot ulcer abscess/osteomyelitis - Infectious disease following, due to exposed bone, being treated as osteomyelitis -status post excisional debridement of skin, subcutaneous tissue, muscle, fascia and bone of left plantar wound by 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 Baxter Flattery 6/28 afternoon, since he will be having transmetatarsal amputation will continue IV antibiotics while inpatient and then transition to oral cephalosporins for 1 week of discharge  Diabetes -Hold sulfonylurea, Januvia, metformin, Invokana - continue Lantus 20 daily  - hemoglobin A1c is 7.6  Hypertension -Continue metoprolol  Ulcerative colitis Mild, not clinically active. -resume Mesalamine at discharge  Hyponatremia Resolved  Acute renal failure  Baseline Cr 0.8, peaked here at 2.2, now down to 1.5.   -stable, monitor  DVT prophylaxis: eliquis Code Status: FULL Family Communication: no family at bedside Disposition Plan: to be determined post op  Consultants:   Critical care  Neurology  Infectious disease  Cardiology  Procedures:   Echocardiogram Study Conclusions  - Left ventricle: The cavity size was normal. Systolic function was   normal. The estimated ejection fraction was in the range of 60%   to 65%. Wall motion was normal; there were no regional wall   motion abnormalities. There was no evidence of elevated   ventricular filling pressure by Doppler parameters. - Left atrium: The atrium was moderately dilated.  Impressions:  - No cardiac source of emboli was indentified    Antimicrobials:   None   Subjective: -feels well, no complaints, undecided about rehab  Objective: Vitals:   03/24/18 2322 03/25/18 0334 03/25/18 0745 03/25/18 1137  BP: 134/82 134/76 136/80 130/70  Pulse: 71 73 83 75  Resp: 18 20 20    Temp: 97.8 F (36.6 C) 97.6 F (36.4 C) 97.7 F (36.5 C) 97.6 F (36.4  C)  TempSrc: Oral Oral Oral Oral  SpO2: 96% 96% 97% 96%  Weight:       Height:        Intake/Output Summary (Last 24 hours) at 03/25/2018 1317 Last data filed at 03/25/2018 0418 Gross per 24 hour  Intake 2517.26 ml  Output 1185 ml  Net 1332.26 ml   Filed Weights   03/16/18 2000 03/16/18 2113  Weight: 97.8 kg (215 lb 9.8 oz) 97.8 kg (215 lb 9.8 oz)    Examination: Gen: Awake, Alert, Oriented X 3,  HEENT: PERRLA, Neck supple, no JVD Lungs: Good air movement bilaterally, CTAB CVS: S1S2/Irregular rhthm Abd: soft, Non tender, non distended, BS present Extremities: L foot with TMT- wound vac Skin: no new rashes  Data Reviewed: I have personally reviewed following labs and imaging studies:  CBC: Recent Labs  Lab 03/21/18 0508 03/22/18 0415 03/23/18 0521 03/24/18 0500 03/25/18 0500  WBC 18.3* 17.4* 18.3* 14.8* 15.9*  HGB 11.0* 11.1* 11.4* 10.5* 10.5*  HCT 34.7* 35.2* 35.8* 33.2* 33.2*  MCV 90.4 89.6 90.4 90.0 90.2  PLT 469* 535* 618* 617* 245*   Basic Metabolic Panel: Recent Labs  Lab 03/19/18 1010 03/21/18 0508 03/22/18 0415 03/23/18 0521 03/24/18 0500 03/25/18 0500  NA 132* 139 137 135 139 138  K 3.3* 3.2* 4.0 4.7 3.8 3.9  CL 101 104 105 102 103 103  CO2 22 24 22 25 27 29   GLUCOSE 230* 153* 171* 212* 183* 171*  BUN 23* 20 17 18 16 12   CREATININE 1.50* 1.46* 1.36* 1.32* 1.23 1.22  CALCIUM 7.4* 7.8* 7.9* 8.0* 8.2* 8.2*  MG 1.9  --   --   --   --   --    GFR: Estimated Creatinine Clearance: 65.4 mL/min (by C-G formula based on SCr of 1.22 mg/dL). Liver Function Tests: No results for input(s): AST, ALT, ALKPHOS, BILITOT, PROT, ALBUMIN in the last 168 hours. No results for input(s): LIPASE, AMYLASE in the last 168 hours. No results for input(s): AMMONIA in the last 168 hours. Coagulation Profile: No results for input(s): INR, PROTIME in the last 168 hours. Cardiac Enzymes: No results for input(s): CKTOTAL, CKMB, CKMBINDEX, TROPONINI in the last 168 hours. BNP (last 3 results) No results for input(s): PROBNP in the last 8760  hours. HbA1C: No results for input(s): HGBA1C in the last 72 hours. CBG: Recent Labs  Lab 03/24/18 1453 03/24/18 1647 03/24/18 2139 03/25/18 0623 03/25/18 1139  GLUCAP 159* 156* 238* 164* 232*   Lipid Profile: No results for input(s): CHOL, HDL, LDLCALC, TRIG, CHOLHDL, LDLDIRECT in the last 72 hours. Thyroid Function Tests: No results for input(s): TSH, T4TOTAL, FREET4, T3FREE, THYROIDAB in the last 72 hours. Anemia Panel: No results for input(s): VITAMINB12, FOLATE, FERRITIN, TIBC, IRON, RETICCTPCT in the last 72 hours. Urine analysis:    Component Value Date/Time   COLORURINE YELLOW 03/16/2018 2125   APPEARANCEUR HAZY (A) 03/16/2018 2125   LABSPEC 1.011 03/16/2018 2125   PHURINE 6.0 03/16/2018 2125   GLUCOSEU >=500 (A) 03/16/2018 2125   HGBUR LARGE (A) 03/16/2018 2125   BILIRUBINUR NEGATIVE 03/16/2018 2125   KETONESUR 5 (A) 03/16/2018 2125   PROTEINUR 30 (A) 03/16/2018 2125   NITRITE NEGATIVE 03/16/2018 2125   LEUKOCYTESUR LARGE (A) 03/16/2018 2125   Sepsis Labs: @LABRCNTIP (procalcitonin:4,lacticacidven:4)  ) Recent Results (from the past 240 hour(s))  MRSA PCR Screening     Status: None   Collection Time: 03/16/18 10:37 PM  Result Value Ref Range Status   MRSA by  PCR NEGATIVE NEGATIVE Final    Comment:        The GeneXpert MRSA Assay (FDA approved for NASAL specimens only), is one component of a comprehensive MRSA colonization surveillance program. It is not intended to diagnose MRSA infection nor to guide or monitor treatment for MRSA infections. Performed at [facility], [place] [address] [place], [place] [address]   Urine Culture     Status: Abnormal   Collection Time: 03/17/18  8:30 AM  Result Value Ref Range Status   Specimen Description URINE, CLEAN CATCH  Final   Special Requests   Final    NONE Performed at [facility], [place] [name] [place], [place] [address]    Culture >=100,000 COLONIES/mL ENTEROCOCCUS FAECALIS (A)  Final    Report Status 03/19/2018 FINAL  Final   Organism ID, Bacteria ENTEROCOCCUS FAECALIS (A)  Final      Susceptibility   Enterococcus faecalis - MIC*    AMPICILLIN <=2 SENSITIVE Sensitive     LEVOFLOXACIN 1 SENSITIVE Sensitive     NITROFURANTOIN <=16 SENSITIVE Sensitive     VANCOMYCIN 2 SENSITIVE Sensitive     * >=100,000 COLONIES/mL ENTEROCOCCUS FAECALIS  Culture, blood (routine x 2)     Status: None   Collection Time: 03/17/18 11:48 AM  Result Value Ref Range Status   Specimen Description BLOOD RIGHT ANTECUBITAL  Final   Special Requests   Final    BOTTLES DRAWN AEROBIC AND ANAEROBIC Blood Culture adequate volume   Culture   Final    NO GROWTH 5 DAYS Performed at [facility], [place] [address], [place], [place] [address]    Report Status 03/22/2018 FINAL  Final  Aerobic Culture (superficial specimen)     Status: None   Collection Time: 03/17/18 11:51 AM  Result Value Ref Range Status   Specimen Description WOUND LEFT FOOT  Final   Special Requests NONE  Final   Gram Stain   Final    FEW WBC PRESENT,BOTH PMN AND MONONUCLEAR ABUNDANT GRAM POSITIVE COCCI ABUNDANT GRAM NEGATIVE RODS Performed at [facility], [address] [place], [place] [address]    Culture   Final    ABUNDANT STAPHYLOCOCCUS SPECIES (COAGULASE NEGATIVE) ABUNDANT STREPTOCOCCUS GROUP C    Report Status 03/20/2018 FINAL  Final   Organism ID, Bacteria STAPHYLOCOCCUS SPECIES (COAGULASE NEGATIVE)  Final      Susceptibility   Staphylococcus species (coagulase negative) - MIC*    CIPROFLOXACIN <=0.5 SENSITIVE Sensitive     ERYTHROMYCIN <=0.25 SENSITIVE Sensitive     GENTAMICIN 8 INTERMEDIATE Intermediate     OXACILLIN SENSITIVE Sensitive     TETRACYCLINE <=1 SENSITIVE Sensitive     VANCOMYCIN 1 SENSITIVE Sensitive     TRIMETH/SULFA <=10 SENSITIVE Sensitive     CLINDAMYCIN <=0.25 SENSITIVE Sensitive     RIFAMPIN <=0.5 SENSITIVE Sensitive     Inducible Clindamycin NEGATIVE Sensitive     * ABUNDANT  STAPHYLOCOCCUS SPECIES (COAGULASE NEGATIVE)  Culture, blood (routine x 2)     Status: None   Collection Time: 03/17/18 11:55 AM  Result Value Ref Range Status   Specimen Description BLOOD LEFT ANTECUBITAL  Final   Special Requests   Final    BOTTLES DRAWN AEROBIC AND ANAEROBIC Blood Culture adequate volume   Culture   Final    NO GROWTH 5 DAYS Performed at [facility], [address], [place],  [address]    Report Status 03/22/2018 FINAL  Final  Anaerobic culture     Status: None   Collection Time: 03/17/18  4:15 PM  Result Value Ref Range Status   Specimen Description WOUND LEFT FOOT  Final   Special Requests NONE  Final   Culture   Final    MODERATE PREVOTELLA MELANINOGENICA BETA LACTAMASE POSITIVE Performed at [facility], [address] [place], [place] [address]    Report Status 03/24/2018 FINAL  Final  Aerobic/Anaerobic Culture (surgical/deep wound)     Status: None   Collection Time: 03/17/18  8:28 PM  Result Value Ref Range Status   Specimen Description ABSCESS LEFT FOOT  Final   Special Requests PATIENT ON FOLLOWING ZOSYN VANC  Final   Gram Stain   Final    MODERATE WBC PRESENT, PREDOMINANTLY PMN MODERATE GRAM POSITIVE COCCI FEW GRAM NEGATIVE RODS    Culture   Final    MODERATE STREPTOCOCCUS GROUP C MODERATE PREVOTELLA MELANINOGENICA BETA LACTAMASE POSITIVE Performed at [facility], [place] [address], [place], [place] [address]    Report Status 03/22/2018 FINAL  Final  Surgical pcr screen     Status: None   Collection Time: 03/23/18  9:20 PM  Result Value Ref Range Status   MRSA, PCR NEGATIVE NEGATIVE Final   Staphylococcus aureus NEGATIVE NEGATIVE Final    Comment: (NOTE) The Xpert SA Assay (FDA approved for NASAL specimens in patients 39 years of age and older), is one component of a comprehensive surveillance program. It is not intended to diagnose infection nor to guide or monitor treatment. Performed at [facility],  [place] [address] [place], [place] [address]          Radiology Studies: No results found.      Scheduled Meds: . apixaban  5 mg Oral BID  . diltiazem  240 mg Oral Daily  . docusate sodium  100 mg Oral BID  . famotidine  20 mg Oral BID  . FLUoxetine  20 mg Oral Daily  . insulin aspart  0-15 Units Subcutaneous TID WC  . insulin aspart  0-5 Units Subcutaneous QHS  . insulin glargine  20 Units Subcutaneous QHS  . metoprolol succinate  150 mg Oral Daily  . metoprolol succinate  50 mg Oral Once  . rosuvastatin  5 mg Oral q1800  . sodium chloride flush  10-40 mL Intracatheter Q12H   Continuous Infusions: . sodium chloride Stopped (03/18/18 1556)  . sodium chloride 10 mL/hr at 03/25/18 0418  . cefTRIAXone (ROCEPHIN)  IV 2 g (03/25/18 0958)  . methocarbamol (ROBAXIN)  IV       LOS: 9 days    Time spent: 25 minutes    Domenic Polite, MD [facility] 03/25/2018, 1:17 PM     Page via [email or web address] Password [email or web address] If 7PM-7AM, please contact night-coverage

## 2018-03-25 NOTE — Progress Notes (Signed)
Pt slept through the night, c/o moderate pain on his foot and was medicated as per [facility]

## 2018-03-25 NOTE — Plan of Care (Signed)
[name]' pain has been controlled well since his surgery.  The wound vac dressing is clean with minimal drainage in the tubing/cannister.  I am concerned that [name] is experiencing a psychosocial exception with the surgery.  He insists upon having his stump covered at all times, prefers bed to chair and is short tempered with family.  Nursing will continue supporting progressive mobilization, body acceptance, pain control and site healing.

## 2018-03-25 NOTE — Progress Notes (Signed)
Physical Therapy Treatment Patient Details Name: Kevin [name] MRN: [id number] DOB: 15-Apr-1941 Today's Date: 03/25/2018    History of Present Illness  Kevin [name] is a 77 y.o.-year-old male with a left diabetic foot ulcer with abscess.  He was admitted to the neuro ICU following  stroke like symptoms 03/16/18.  He was noted to have a left plantar foot ulcer.  During the course of the day 6/21 he was decompensating and becoming septic. Underwent I&D left foot ulcer 03/17/18. 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     PT Comments    Pt now s/p L transmetatarsal amputation and is TDWB on the LLE. Pt verbalizes not expecting this amputation to occur and appears to be emotionally distressed and somewhat unrealistic regarding his current situation and the amount of assist he may require during his rehab. Overall pt is not able to maintain TDWB status during OOB mobility. His impulsive behavior and decreased safety awareness increase risk for falls, and PT continues to feel that CIR would be the most appropriate d/c disposition to maximize functional independence and return to PLOF.   Follow Up Recommendations  Supervision/Assistance - 24 hour;CIR     Equipment Recommendations  Rolling walker with 5" wheels;3in1 (PT)    Recommendations for Other Services       Precautions / Restrictions Precautions Precautions: Fall Precaution Comments: watch HR Restrictions Weight Bearing Restrictions: Yes LLE Weight Bearing: Touchdown weight bearing LLE Partial Weight Bearing Percentage or Pounds: heel Other Position/Activity Restrictions: has Darco shoe in room - no orders to use it s/p transmet amputation so wrapped foot in ACE wrap for a barrier during TDWB. Took ACE off at end of session to avoid any excessive compression to the area.     Mobility  Bed Mobility Overal bed mobility: Needs Assistance Bed Mobility: Supine to Sit     Supine to sit: Supervision Sit to supine:  Supervision   General bed mobility comments: supervision for safety  Transfers Overall transfer level: Needs assistance Equipment used: Rolling walker (2 wheeled) Transfers: Sit to/from Stand Sit to Stand: Min assist Stand pivot transfers: Min assist;From elevated surface       General transfer comment: min A for steadying assist and cues for safe hand placement. Pt required frequent cues for maintenance of TDWB status on the L however pt was not able to maintain.   Ambulation/Gait                 Stairs             Wheelchair Mobility    Modified Rankin (Stroke Patients Only) Modified Rankin (Stroke Patients Only) Pre-Morbid Rankin Score: No symptoms Modified Rankin: Moderately severe disability     Balance Overall balance assessment: Needs assistance Sitting-balance support: No upper extremity supported;Feet supported Sitting balance-Leahy Scale: Good     Standing balance support: Bilateral upper extremity supported;During functional activity Standing balance-Leahy Scale: Fair                              Cognition Arousal/Alertness: Awake/alert Behavior During Therapy: Anxious;Impulsive Overall Cognitive Status: Impaired/Different from baseline Area of Impairment: Safety/judgement;Awareness;Problem solving                     Memory: Decreased recall of precautions Following Commands: Follows multi-step commands with increased time Safety/Judgement: Decreased awareness of safety;Decreased awareness of deficits Awareness: Emergent Problem Solving: Requires verbal cues General Comments: Pt  continues with impulsivity, cues for safety       Exercises      General Comments        Pertinent Vitals/Pain Pain Assessment: Faces Faces Pain Scale: Hurts even more Pain Location: L foot Pain Descriptors / Indicators: Sore;Guarding Pain Intervention(s): Monitored during session    Home Living                       Prior Function            PT Goals (current goals can now be found in the care plan section) Acute Rehab PT Goals Patient Stated Goal: get to rehab and get independent PT Goal Formulation: With patient Time For Goal Achievement: 04/01/18 Potential to Achieve Goals: Good Progress towards PT goals: Progressing toward goals    Frequency    Min 3X/week      PT Plan Current plan remains appropriate    Co-evaluation              AM-PAC PT "6 Clicks" Daily Activity  Outcome Measure  Difficulty turning over in bed (including adjusting bedclothes, sheets and blankets)?: None Difficulty moving from lying on back to sitting on the side of the bed? : A Little Difficulty sitting down on and standing up from a chair with arms (e.g., wheelchair, bedside commode, etc,.)?: Unable Help needed moving to and from a bed to chair (including a wheelchair)?: A Little Help needed walking in hospital room?: A Little Help needed climbing 3-5 steps with a railing? : A Lot 6 Click Score: 16    End of Session Equipment Utilized During Treatment: Gait belt Activity Tolerance: Other (comment)(Self-limiting) Patient left: in chair;with call bell/phone within reach;with chair alarm set Nurse Communication: Mobility status PT Visit Diagnosis: Other abnormalities of gait and mobility (R26.89);Unsteadiness on feet (R26.81);Pain Pain - Right/Left: Left Pain - part of body: Ankle and joints of foot     Time: 1129-1150 PT Time Calculation (min) (ACUTE ONLY): 21 min  Charges:  $Gait Training: 8-22 mins                    G Codes:       Rolinda Roan, PT, DPT Acute Rehabilitation Services Pager: [phone]    Thelma Comp 03/25/2018, 12:45 PM

## 2018-03-25 NOTE — Progress Notes (Signed)
     Subjective: 1 Day Post-Op Procedure(s) (LRB): Transmetatarsal Amputation Left Foot (Left) He is not experiencing severe pain, wants to get back into bed. Probably better if his foot can be elevated to decrease  Flap swelling. VAC intact.   Patient reports pain as mild.    Objective:   VITALS:  Temp:  [97.6 F (36.4 C)-97.9 F (36.6 C)] 97.6 F (36.4 C) (06/29 1137) Pulse Rate:  [64-89] 75 (06/29 1137) Resp:  [9-20] 20 (06/29 0745) BP: (121-142)/(70-82) 130/70 (06/29 1137) SpO2:  [95 %-100 %] 96 % (06/29 1137)  Neurologically intact ABD soft Dorsiflexion/Plantar flexion intact VAC intact with normal suction.    LABS Recent Labs    03/23/18 0521 03/24/18 0500 03/25/18 0500  HGB 11.4* 10.5* 10.5*  WBC 18.3* 14.8* 15.9*  PLT 618* 617* 621*   Recent Labs    03/24/18 0500 03/25/18 0500  NA 139 138  K 3.8 3.9  CL 103 103  CO2 27 29  BUN 16 12  CREATININE 1.23 1.22  GLUCOSE 183* 171*   No results for input(s): LABPT, INR in the last 72 hours.   Assessment/Plan: 1 Day Post-Op Procedure(s) (LRB): Transmetatarsal Amputation Left Foot (Left)  Advance diet Up with therapy Discharge home with home health  VAC to remain on for one week.   Basil Dess 03/25/2018, 3:25 PMPatient ID: Kevin [name], male   DOB: 04/12/1941, 77 y.o.   MRN: [id number]

## 2018-03-25 NOTE — Progress Notes (Signed)
Chart reviewed, patient seen by cardiology service 03/20/18 for afib new onset. Plans for rate control with Toprol, start eliquis for stroke prevention given his CHADS2 Vasc score of 6. Currently on TOprol 150, dilt 240 for rate control. Tele review shows some elevated rates at times. He does have an ongoing foot ulcer/ostero, had transmetatarsal amputation yesterday. Notes mention significant anxiety regarding his surgery. Monitor rates today, room to titrate toprol or dilt if needed.       Carlyle Dolly MD

## 2018-03-26 ENCOUNTER — Encounter (HOSPITAL_COMMUNITY): Payer: Self-pay | Admitting: General Practice

## 2018-03-26 LAB — BASIC METABOLIC PANEL
Anion gap: 7 (ref 5–15)
BUN: 12 mg/dL (ref 8–23)
CO2: 27 mmol/L (ref 22–32)
Calcium: 7.7 mg/dL — ABNORMAL LOW (ref 8.9–10.3)
Chloride: 101 mmol/L (ref 98–111)
Creatinine, Ser: 1.27 mg/dL — ABNORMAL HIGH (ref 0.61–1.24)
GFR calc Af Amer: >60 mL/min (ref >60)
GFR calc non Af Amer: 54 mL/min — ABNORMAL LOW (ref >60)
Glucose, Bld: 179 mg/dL — ABNORMAL HIGH (ref 70–99)
Potassium: 4.1 mmol/L (ref 3.5–5.1)
Sodium: 135 mmol/L (ref 135–145)

## 2018-03-26 LAB — GLUCOSE, CAPILLARY
Glucose-Capillary: 174 mg/dL — ABNORMAL HIGH (ref 70–99)
Glucose-Capillary: 217 mg/dL — ABNORMAL HIGH (ref 70–99)
Glucose-Capillary: 163 mg/dL — ABNORMAL HIGH (ref 70–99)
Glucose-Capillary: 173 mg/dL — ABNORMAL HIGH (ref 70–99)

## 2018-03-26 LAB — CBC
HCT: 32.9 % — ABNORMAL LOW (ref 39.0–52.0)
Hemoglobin: 10.4 g/dL — ABNORMAL LOW (ref 13.0–17.0)
MCH: 28.7 pg (ref 26.0–34.0)
MCHC: 31.6 g/dL (ref 30.0–36.0)
MCV: 90.6 fL (ref 78.0–100.0)
Platelets: 604 K/uL — ABNORMAL HIGH (ref 150–400)
RBC: 3.63 MIL/uL — ABNORMAL LOW (ref 4.22–5.81)
RDW: 13.2 % (ref 11.5–15.5)
WBC: 13.7 K/uL — ABNORMAL HIGH (ref 4.0–10.5)

## 2018-03-26 MED ORDER — INSULIN GLARGINE 100 UNIT/ML ~~LOC~~ SOLN
30.0000 [IU] | Freq: Every day | SUBCUTANEOUS | Status: DC
  Administered 2018-03-26: 30 [IU] via SUBCUTANEOUS
  Filled 2018-03-26 (×2): qty 0.3

## 2018-03-26 NOTE — Plan of Care (Signed)
Kevin [name] progresses in his recovery.  He is transferring to the bedside commode with 1 assist and not often asking for pain control.  His wound vac is clean, dry, intact with a good seal and only small amounts of serosanguinous drainage.  Psychosocial assessment is also improved with the patient looking at his stump and adapting to changes in function.     Nursing POC:  Infection prevention of surgical site and central line, progressive mobilization, pain control and safe transfers.

## 2018-03-26 NOTE — Progress Notes (Signed)
     Subjective: 2 Days Post-Op Procedure(s) (LRB): Transmetatarsal Amputation Left Foot (Left) Awake, alert and oriented x 4. Left foot transmet amputation site with VAC in place, normal negative pressure draw.   Patient reports pain as mild.    Objective:   VITALS:  Temp:  [97.5 F (36.4 C)-98.5 F (36.9 C)] 98.5 F (36.9 C) (06/30 1128) Pulse Rate:  [70-107] 84 (06/30 1128) Resp:  [16-20] 16 (06/30 1128) BP: (112-134)/(73-88) 134/83 (06/30 1128) SpO2:  [93 %-98 %] 98 % (06/30 1128)  Neurologically intact ABD soft Neurovascular intact Sensation intact distally Intact pulses distally Dorsiflexion/Plantar flexion intact Incision: dressing C/D/I No cellulitis present Compartment soft   LABS Recent Labs    03/24/18 0500 03/25/18 0500 03/26/18 0500  HGB 10.5* 10.5* 10.4*  WBC 14.8* 15.9* 13.7*  PLT 617* 621* 604*   Recent Labs    03/25/18 0500 03/26/18 0500  NA 138 135  K 3.9 4.1  CL 103 101  CO2 29 27  BUN 12 12  CREATININE 1.22 1.27*  GLUCOSE 171* 179*   No results for input(s): LABPT, INR in the last 72 hours.   Assessment/Plan: 2 Days Post-Op Procedure(s) (LRB): Transmetatarsal Amputation Left Foot (Left)  Advance diet Up with therapy Discharge home with home health Darco shoe may be of some benefit.  Basil Dess 03/26/2018, 11:44 AMPatient ID: Kevin [name], male   DOB: October 29, 1940, 77 y.o.   MRN: [id number]

## 2018-03-26 NOTE — Progress Notes (Signed)
Occupational Therapy Treatment Patient Details Name: Kevin [name] MRN: [id number] DOB: 1941/09/10 Today's Date: 03/26/2018    History of present illness  Kevin [name] is a 77 y.o.-year-old male with a left diabetic foot ulcer with abscess.  He was admitted to the neuro ICU following  stroke like symptoms 03/16/18.  He was noted to have a left plantar foot ulcer.  During the course of the day 6/21 he was decompensating and becoming septic.  The wound was foul-smelling and concern was that the source of his sepsis was the infected foot.  Underwent I&D left foot ulcer 03/17/18.   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 Stroke symptoms seem to have resolved.   OT comments  Pt progressing towards OT goals Post-op. Pt able to complete SPT to [facility] with RW and min A for cues and at this time, Pt not maintaining TDWB despite cues. (he did much better on return to bed rather than getting to the [facility] initially) Pt able to maintain during standing for rear peri care. And improved when transferring to the right side. Wife present throughout session and receiving education. Pt remains with cognitive deficits. CIR remains essential for return to PLOF. OT will continue to follow acutely.    Follow Up Recommendations  CIR;Supervision/Assistance - 24 hour    Equipment Recommendations  Other (comment)(defer to next venue of care)    Recommendations for Other Services      Precautions / Restrictions Precautions Precautions: Fall Precaution Comments: TDWB LLE Required Braces or Orthoses: Other Brace/Splint Other Brace/Splint: Darco shoe Restrictions Weight Bearing Restrictions: Yes LLE Weight Bearing: Touchdown weight bearing LLE Partial Weight Bearing Percentage or Pounds: heel Other Position/Activity Restrictions: Darco Shoe       Mobility Bed Mobility Overal bed mobility: Needs Assistance Bed Mobility: Supine to Sit;Sit to Supine     Supine to sit: Supervision Sit to supine:  Supervision   General bed mobility comments: supervision for safety  Transfers Overall transfer level: Needs assistance Equipment used: Rolling walker (2 wheeled) Transfers: Sit to/from [facility] Sit to Stand: Min guard Stand pivot transfers: Min assist(vc for WB status)       General transfer comment: min A for steadying assist and cues for safe hand placement. Pt required frequent cues for maintenance of TDWB status on the L however pt was not able to maintain.     Balance Overall balance assessment: Needs assistance Sitting-balance support: No upper extremity supported;Feet supported Sitting balance-Leahy Scale: Good     Standing balance support: Bilateral upper extremity supported;During functional activity Standing balance-Leahy Scale: Poor Standing balance comment: dependent on RW for support                           ADL either performed or assessed with clinical judgement   ADL Overall ADL's : Needs assistance/impaired     Grooming: Wash/dry hands;Set up;Sitting Grooming Details (indicate cue type and reason): EOB                 Toilet Transfer: Min guard;Cueing for safety;Cueing for sequencing;Stand-pivot;BSC;RW Toilet Transfer Details (indicate cue type and reason): vc for sequencing and for WB status to be maintained.  Toileting- Clothing Manipulation and Hygiene: Maximal assistance;Sit to/from stand Toileting - Clothing Manipulation Details (indicate cue type and reason): Pt required assistance for rear peri care for throughness, dependent on RW for balance     Functional mobility during ADLs: Minimal assistance;Min guard;Rolling walker;Cueing for  safety General ADL Comments: multimodal cues for WB     Vision       Perception     Praxis      Cognition Arousal/Alertness: Awake/alert Behavior During Therapy: Impulsive Overall Cognitive Status: Impaired/Different from baseline Area of Impairment:  Safety/judgement;Awareness;Problem solving                         Safety/Judgement: Decreased awareness of safety;Decreased awareness of deficits Awareness: Emergent Problem Solving: Requires verbal cues General Comments: Pt continues with impulsivity, cues for safety and WB status. Today, Pt struggled to follow directions for SPT and WB         Exercises     Shoulder Instructions       General Comments Wife present during session    Pertinent Vitals/ Pain       Pain Assessment: 0-10 Pain Score: 3  Pain Location: L foot Pain Descriptors / Indicators: Sore;Guarding Pain Intervention(s): Monitored during session;Repositioned;Limited activity within patient's tolerance  Home Living                                          Prior Functioning/Environment              Frequency  Min 3X/week        Progress Toward Goals  OT Goals(current goals can now be found in the care plan section)  Progress towards OT goals: Progressing toward goals  Acute Rehab OT Goals Patient Stated Goal: get to rehab and get independent OT Goal Formulation: With patient Time For Goal Achievement: 04/02/18 Potential to Achieve Goals: Good  Plan Discharge plan remains appropriate;Frequency remains appropriate    Co-evaluation                 AM-PAC PT "6 Clicks" Daily Activity     Outcome Measure   Help from another person eating meals?: None Help from another person taking care of personal grooming?: A Little Help from another person toileting, which includes using toliet, bedpan, or urinal?: A Lot Help from another person bathing (including washing, rinsing, drying)?: A Lot Help from another person to put on and taking off regular upper body clothing?: A Little Help from another person to put on and taking off regular lower body clothing?: A Lot 6 Click Score: 16    End of Session Equipment Utilized During Treatment: Rolling walker  OT Visit  Diagnosis: Unsteadiness on feet (R26.81);Other abnormalities of gait and mobility (R26.89);Muscle weakness (generalized) (M62.81);Pain;Other symptoms and signs involving cognitive function Pain - Right/Left: Left Pain - part of body: Ankle and joints of foot   Activity Tolerance Patient tolerated treatment well   Patient Left in bed;with call bell/phone within reach;with family/visitor present   Nurse Communication Mobility status;Weight bearing status        Time: 3825-0539 OT Time Calculation (min): 15 min  Charges: OT General Charges $OT Visit: 1 Visit OT Treatments $Self Care/Home Management : 8-22 mins  Hulda Humphrey OTR/L [place] 03/26/2018, 4:37 PM

## 2018-03-26 NOTE — Anesthesia Postprocedure Evaluation (Signed)
Anesthesia Post Note  Patient: Kevin [name]  Procedure(s) Performed: Transmetatarsal Amputation Left Foot (Left Foot)     Patient location during evaluation: PACU Anesthesia Type: Regional Level of consciousness: awake and alert Pain management: pain level controlled Vital Signs Assessment: post-procedure vital signs reviewed and stable Respiratory status: spontaneous breathing Cardiovascular status: stable Anesthetic complications: no    Last Vitals:  Vitals:   03/26/18 2010 03/26/18 2355  BP: (!) 141/85 (!) 141/94  Pulse: 93 89  Resp: 18 18  Temp: 36.9 C 36.5 C  SpO2: 94% 93%    Last Pain:  Vitals:   03/26/18 2355  TempSrc: Oral  PainSc:                  Nolon Nations

## 2018-03-26 NOTE — Progress Notes (Signed)
Orthopedic Tech Progress Note Patient Details:  Kevin [name] [birth year] [id number]  Ortho Devices Type of Ortho Device: Darco shoe, Crutches Ortho Device/Splint Interventions: Application, Adjustment   Post Interventions Patient Tolerated: Well Instructions Provided: Care of device   [place] Pink 03/26/2018, 2:55 PM

## 2018-03-26 NOTE — Progress Notes (Signed)
PROGRESS NOTE    Kevin [name]  WHQ:[id number] DOB: [birth year] DOA: 03/16/2018 PCP: [name], MD  Brief Narrative:  Kevin [name] is a 77 y.o. M with NIDDM, HTN and Ulcerative colitis who presented with left sided hemiparesis.  In the ER, had left sided hemiparesis (LUE>LLE, normal strength on right), subtle left facial droop, resolving expressive aphasia.  Got tPA, admitted to ICU.  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 -hospitalization complicated by diabetic foot ulcer and abscess with exposed bone, status post debridement by orthopedics Dr. 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 Fib and hence aspirin was changed over to Eliquis -PT OT eval completed, CIR was recommended, patient hesitant to consider this, will re-assess post op/tomorrow when PT reevaluates  New onset paroxysmal atrial fibrillation CHA2DS2-VASc Score = 6 (age, HTN, DM, Stroke).   -heart rate better controlled on increased metoprolol and Cardizem -restarted eliquis  Diabetic foot ulcer abscess/osteomyelitis -status post excisional debridement of skin, subcutaneous tissue, muscle, fascia and bone of left plantar wound by [name] December on 6/21 - surgical cultures with group C streptococcus and coagulase-negative Staphylococcus -original plan was for 6 weeks of ceftriaxone starting 6/21 -consulted  Orthopedics Dr.Duda, MRI noted abscess/osteo and sinus tract to metatarsal heads, s/p Transmetatarsal amputation per Dr.Duda 6/28, wound VAC placed -discussed with infectious disease Dr. Baxter Flattery 6/28 afternoon, since he is s/p transmetatarsal amputation will continue IV ceftriaxone while inpatient and then transition to oral cephalosporins for 1 week of discharge  Diabetes -Hold sulfonylurea, Januvia, metformin, Invokana - continue Lantus, dose increased - hemoglobin A1c is 7.6  Hypertension -Continue metoprolol  Ulcerative colitis Mild, not clinically active. -resume Mesalamine at discharge  Hyponatremia Resolved  Acute renal failure  Baseline Cr 0.8, peaked here at 2.2, now down to 1.5.   -stable, monitor  DVT prophylaxis: eliquis Code Status: FULL Family Communication: no family at bedside Disposition Plan: CIR versus home with home health services early next week  Consultants:   Critical care  Neurology  Infectious disease  Cardiology  Procedures:   Echocardiogram Study Conclusions  - Left ventricle: The cavity size was normal. Systolic function was   normal. The estimated ejection fraction was in the range of 60%   to 65%. Wall motion was normal; there were no regional wall   motion abnormalities. There was no evidence of elevated   ventricular filling pressure by Doppler parameters. - Left atrium: The atrium was moderately dilated.  Impressions:  - No cardiac source of emboli was indentified    Antimicrobials:   None   Subjective: -feels well overall, no pain -Tolerating diet, no chest pain or dyspnea  Objective: Vitals:   03/25/18 2337 03/26/18 0537 03/26/18 0828 03/26/18 1128  BP: 131/88 131/82 112/77 134/83  Pulse: 95 96 (!) 107 84  Resp: 18 18 20 16   Temp: 97.8 F (36.6 C) (!) 97.5 F (36.4 C) 98.2 F (36.8 C) 98.5 F (36.9 C)  TempSrc: Oral Oral Oral Oral  SpO2:  95% 93% 96% 98%  Weight:      Height:        Intake/Output  Summary (Last 24 hours) at 03/26/2018 1201 Last data filed at 03/26/2018 1133 Gross per 24 hour  Intake 589.13 ml  Output 4000 ml  Net -3410.87 ml   Filed Weights   03/16/18 2000 03/16/18 2113  Weight: 97.8 kg (215 lb 9.8 oz) 97.8 kg (215 lb 9.8 oz)    Examination: Gen: Awake, Alert, Oriented X 3, no distress HEENT: PERRLA, Neck supple, no JVD Lungs: decreased breath sounds at both bases, CVS: S1-S2/irregularly irregular Abd: soft, Non tender, non distended, BS present Extremities:L foot with TMT- wound vac Skin: no new rashes  Data Reviewed: I have personally reviewed following labs and imaging studies:  CBC: Recent Labs  Lab 03/22/18 0415 03/23/18 0521 03/24/18 0500 03/25/18 0500 03/26/18 0500  WBC 17.4* 18.3* 14.8* 15.9* 13.7*  HGB 11.1* 11.4* 10.5* 10.5* 10.4*  HCT 35.2* 35.8* 33.2* 33.2* 32.9*  MCV 89.6 90.4 90.0 90.2 90.6  PLT 535* 618* 617* 621* 263*   Basic Metabolic Panel: Recent Labs  Lab 03/22/18 0415 03/23/18 0521 03/24/18 0500 03/25/18 0500 03/26/18 0500  NA 137 135 139 138 135  K 4.0 4.7 3.8 3.9 4.1  CL 105 102 103 103 101  CO2 22 25 27 29 27   GLUCOSE 171* 212* 183* 171* 179*  BUN 17 18 16 12 12   CREATININE 1.36* 1.32* 1.23 1.22 1.27*  CALCIUM 7.9* 8.0* 8.2* 8.2* 7.7*   GFR: Estimated Creatinine Clearance: 62.9 mL/min (A) (by C-G formula based on SCr of 1.27 mg/dL (H)). Liver Function Tests: No results for input(s): AST, ALT, ALKPHOS, BILITOT, PROT, ALBUMIN in the last 168 hours. No results for input(s): LIPASE, AMYLASE in the last 168 hours. No results for input(s): AMMONIA in the last 168 hours. Coagulation Profile: No results for input(s): INR, PROTIME in the last 168 hours. Cardiac Enzymes: No results for input(s): CKTOTAL, CKMB, CKMBINDEX, TROPONINI in the last 168 hours. BNP (last 3 results) No results for input(s): PROBNP in the last 8760 hours. HbA1C: No results for input(s): HGBA1C in the last 72 hours. CBG: Recent Labs  Lab  03/25/18 1139 03/25/18 1602 03/25/18 2215 03/26/18 0632 03/26/18 1122  GLUCAP 232* 204* 253* 174* 217*   Lipid Profile: No results for input(s): CHOL, HDL, LDLCALC, TRIG, CHOLHDL, LDLDIRECT in the last 72 hours. Thyroid Function Tests: No results for input(s): TSH, T4TOTAL, FREET4, T3FREE, THYROIDAB in the last 72 hours. Anemia Panel: No results for input(s): VITAMINB12, FOLATE, FERRITIN, TIBC, IRON, RETICCTPCT in the last 72 hours. Urine analysis:    Component Value Date/Time   COLORURINE YELLOW 03/16/2018 2125   APPEARANCEUR HAZY (A) 03/16/2018 2125   LABSPEC 1.011 03/16/2018 2125   PHURINE 6.0 03/16/2018 2125   GLUCOSEU >=500 (A) 03/16/2018 2125   HGBUR LARGE (A) 03/16/2018 2125   BILIRUBINUR NEGATIVE 03/16/2018 2125   KETONESUR 5 (A) 03/16/2018 2125   PROTEINUR 30 (A) 03/16/2018 2125   NITRITE NEGATIVE 03/16/2018 2125   LEUKOCYTESUR LARGE (A) 03/16/2018 2125   Sepsis Labs: @LABRCNTIP (procalcitonin:4,lacticacidven:4)  ) Recent Results (from the past 240 hour(s))  MRSA PCR Screening     Status: None   Collection Time: 03/16/18 10:37 PM  Result Value Ref Range Status   MRSA by PCR NEGATIVE NEGATIVE Final    Comment:        The GeneXpert MRSA Assay (FDA approved for NASAL specimens only), is one component of a comprehensive MRSA colonization surveillance  program. It is not intended to diagnose MRSA infection nor to guide or monitor treatment for MRSA infections. Performed at [facility], [place] [address], [place], [place] [address]   Urine Culture     Status: Abnormal   Collection Time: 03/17/18  8:30 AM  Result Value Ref Range Status   Specimen Description URINE, CLEAN CATCH  Final   Special Requests   Final    NONE Performed at [facility], [place] [address] [place], [place] [address]    Culture >=100,000 COLONIES/mL ENTEROCOCCUS FAECALIS (A)  Final   Report Status 03/19/2018 FINAL  Final   Organism ID, Bacteria ENTEROCOCCUS FAECALIS (A)  Final        Susceptibility   Enterococcus faecalis - MIC*    AMPICILLIN <=2 SENSITIVE Sensitive     LEVOFLOXACIN 1 SENSITIVE Sensitive     NITROFURANTOIN <=16 SENSITIVE Sensitive     VANCOMYCIN 2 SENSITIVE Sensitive     * >=100,000 COLONIES/mL ENTEROCOCCUS FAECALIS  Culture, blood (routine x 2)     Status: None   Collection Time: 03/17/18 11:48 AM  Result Value Ref Range Status   Specimen Description BLOOD RIGHT ANTECUBITAL  Final   Special Requests   Final    BOTTLES DRAWN AEROBIC AND ANAEROBIC Blood Culture adequate volume   Culture   Final    NO GROWTH 5 DAYS Performed at [facility], [place] [address] [place], [place] [address]    Report Status 03/22/2018 FINAL  Final  Aerobic Culture (superficial specimen)     Status: None   Collection Time: 03/17/18 11:51 AM  Result Value Ref Range Status   Specimen Description WOUND LEFT FOOT  Final   Special Requests NONE  Final   Gram Stain   Final    FEW WBC PRESENT,BOTH PMN AND MONONUCLEAR ABUNDANT GRAM POSITIVE COCCI ABUNDANT GRAM NEGATIVE RODS Performed at [facility], [address], [place], [place] [address]    Culture   Final    ABUNDANT STAPHYLOCOCCUS SPECIES (COAGULASE NEGATIVE) ABUNDANT STREPTOCOCCUS GROUP C    Report Status 03/20/2018 FINAL  Final   Organism ID, Bacteria STAPHYLOCOCCUS SPECIES (COAGULASE NEGATIVE)  Final      Susceptibility   Staphylococcus species (coagulase negative) - MIC*    CIPROFLOXACIN <=0.5 SENSITIVE Sensitive     ERYTHROMYCIN <=0.25 SENSITIVE Sensitive     GENTAMICIN 8 INTERMEDIATE Intermediate     OXACILLIN SENSITIVE Sensitive     TETRACYCLINE <=1 SENSITIVE Sensitive     VANCOMYCIN 1 SENSITIVE Sensitive     TRIMETH/SULFA <=10 SENSITIVE Sensitive     CLINDAMYCIN <=0.25 SENSITIVE Sensitive     RIFAMPIN <=0.5 SENSITIVE Sensitive     Inducible Clindamycin NEGATIVE Sensitive     * ABUNDANT STAPHYLOCOCCUS SPECIES (COAGULASE NEGATIVE)  Culture, blood (routine x 2)     Status: None    Collection Time: 03/17/18 11:55 AM  Result Value Ref Range Status   Specimen Description BLOOD LEFT ANTECUBITAL  Final   Special Requests   Final    BOTTLES DRAWN AEROBIC AND ANAEROBIC Blood Culture adequate volume   Culture   Final    NO GROWTH 5 DAYS Performed at [facility], [address] [place], [place] [address]    Report Status 03/22/2018 FINAL  Final  Anaerobic culture     Status: None   Collection Time: 03/17/18  4:15 PM  Result Value Ref Range Status   Specimen Description WOUND LEFT FOOT  Final  Special Requests NONE  Final   Culture   Final    MODERATE PREVOTELLA MELANINOGENICA BETA LACTAMASE POSITIVE Performed at [facility], [place] [address] [place], [place] [address]    Report Status 03/24/2018 FINAL  Final  Aerobic/Anaerobic Culture (surgical/deep wound)     Status: None   Collection Time: 03/17/18  8:28 PM  Result Value Ref Range Status   Specimen Description ABSCESS LEFT FOOT  Final   Special Requests PATIENT ON FOLLOWING ZOSYN VANC  Final   Gram Stain   Final    MODERATE WBC PRESENT, PREDOMINANTLY PMN MODERATE GRAM POSITIVE COCCI FEW GRAM NEGATIVE RODS    Culture   Final    MODERATE STREPTOCOCCUS GROUP C MODERATE PREVOTELLA MELANINOGENICA BETA LACTAMASE POSITIVE Performed at [facility], [place] [address] [place] [address], [place], [place] [address]    Report Status 03/22/2018 FINAL  Final  Surgical pcr screen     Status: None   Collection Time: 03/23/18  9:20 PM  Result Value Ref Range Status   MRSA, PCR NEGATIVE NEGATIVE Final   Staphylococcus aureus NEGATIVE NEGATIVE Final    Comment: (NOTE) The Xpert SA Assay (FDA approved for NASAL specimens in patients 16 years of age and older), is one component of a comprehensive surveillance program. It is not intended to diagnose infection nor to guide or monitor treatment. Performed at [facility], [place] [address] [place],  [address]          Radiology Studies: No results  found.      Scheduled Meds: . apixaban  5 mg Oral BID  . diltiazem  240 mg Oral Daily  . docusate sodium  100 mg Oral BID  . famotidine  20 mg Oral BID  . FLUoxetine  20 mg Oral Daily  . insulin aspart  0-15 Units Subcutaneous TID WC  . insulin aspart  0-5 Units Subcutaneous QHS  . insulin glargine  30 Units Subcutaneous QHS  . metoprolol succinate  150 mg Oral Daily  . metoprolol succinate  50 mg Oral Once  . rosuvastatin  5 mg Oral q1800  . sodium chloride flush  10-40 mL Intracatheter Q12H   Continuous Infusions: . cefTRIAXone (ROCEPHIN)  IV 2 g (03/26/18 0917)  . methocarbamol (ROBAXIN)  IV       LOS: 10 days    Time spent: 25 minutes    Domenic Polite, MD [facility] 03/26/2018, 12:01 PM     Page via [email or web address] Password [email or web address] If 7PM-7AM, please contact night-coverage

## 2018-03-26 NOTE — Progress Notes (Signed)
Telemetry reveiwed, afib rates up and down, no sustained significant tachycardia. Recent surgery and being managed for ongoing infection, would expect some tachycardic drive. Room to titrate either beta blocker CCB if needed, at this time I think rates are reasonable. No additional cardiology recs at this time, call with questions   Zandra Abts MD

## 2018-03-27 ENCOUNTER — Encounter (HOSPITAL_COMMUNITY): Payer: Self-pay | Admitting: Orthopedic Surgery

## 2018-03-27 ENCOUNTER — Other Ambulatory Visit: Payer: Self-pay | Admitting: Interventional Cardiology

## 2018-03-27 ENCOUNTER — Inpatient Hospital Stay (HOSPITAL_COMMUNITY)
Admission: RE | Admit: 2018-03-27 | Discharge: 2018-04-01 | DRG: 560 | Disposition: A | Discharge status: Home Health | Payer: Medicare Other | Source: Intra-hospital | Attending: Physical Medicine & Rehabilitation | Admitting: Physical Medicine & Rehabilitation

## 2018-03-27 DIAGNOSIS — L97423 Non-pressure chronic ulcer of left heel and midfoot with necrosis of muscle: Secondary | ICD-10-CM | POA: Diagnosis not present

## 2018-03-27 DIAGNOSIS — E11621 Type 2 diabetes mellitus with foot ulcer: Secondary | ICD-10-CM

## 2018-03-27 DIAGNOSIS — R32 Unspecified urinary incontinence: Secondary | ICD-10-CM | POA: Diagnosis present

## 2018-03-27 DIAGNOSIS — Z833 Family history of diabetes mellitus: Secondary | ICD-10-CM

## 2018-03-27 DIAGNOSIS — E1122 Type 2 diabetes mellitus with diabetic chronic kidney disease: Secondary | ICD-10-CM | POA: Diagnosis present

## 2018-03-27 DIAGNOSIS — I4891 Unspecified atrial fibrillation: Secondary | ICD-10-CM | POA: Diagnosis present

## 2018-03-27 DIAGNOSIS — A409 Streptococcal sepsis, unspecified: Secondary | ICD-10-CM | POA: Diagnosis not present

## 2018-03-27 DIAGNOSIS — I129 Hypertensive chronic kidney disease with stage 1 through stage 4 chronic kidney disease, or unspecified chronic kidney disease: Secondary | ICD-10-CM | POA: Diagnosis present

## 2018-03-27 DIAGNOSIS — R338 Other retention of urine: Secondary | ICD-10-CM

## 2018-03-27 DIAGNOSIS — E8809 Other disorders of plasma-protein metabolism, not elsewhere classified: Secondary | ICD-10-CM

## 2018-03-27 DIAGNOSIS — E119 Type 2 diabetes mellitus without complications: Secondary | ICD-10-CM

## 2018-03-27 DIAGNOSIS — E46 Unspecified protein-calorie malnutrition: Secondary | ICD-10-CM | POA: Diagnosis not present

## 2018-03-27 DIAGNOSIS — N401 Enlarged prostate with lower urinary tract symptoms: Secondary | ICD-10-CM | POA: Diagnosis not present

## 2018-03-27 DIAGNOSIS — Z4781 Encounter for orthopedic aftercare following surgical amputation: Secondary | ICD-10-CM | POA: Diagnosis not present

## 2018-03-27 DIAGNOSIS — D62 Acute posthemorrhagic anemia: Secondary | ICD-10-CM | POA: Diagnosis not present

## 2018-03-27 DIAGNOSIS — R159 Full incontinence of feces: Secondary | ICD-10-CM

## 2018-03-27 DIAGNOSIS — N183 Chronic kidney disease, stage 3 (moderate): Secondary | ICD-10-CM | POA: Diagnosis not present

## 2018-03-27 DIAGNOSIS — Z7901 Long term (current) use of anticoagulants: Secondary | ICD-10-CM

## 2018-03-27 DIAGNOSIS — R5381 Other malaise: Secondary | ICD-10-CM | POA: Diagnosis not present

## 2018-03-27 DIAGNOSIS — L97429 Non-pressure chronic ulcer of left heel and midfoot with unspecified severity: Secondary | ICD-10-CM

## 2018-03-27 DIAGNOSIS — D72828 Other elevated white blood cell count: Secondary | ICD-10-CM | POA: Diagnosis present

## 2018-03-27 DIAGNOSIS — G8918 Other acute postprocedural pain: Secondary | ICD-10-CM

## 2018-03-27 DIAGNOSIS — D72829 Elevated white blood cell count, unspecified: Secondary | ICD-10-CM | POA: Diagnosis present

## 2018-03-27 DIAGNOSIS — I10 Essential (primary) hypertension: Secondary | ICD-10-CM | POA: Diagnosis not present

## 2018-03-27 DIAGNOSIS — L97529 Non-pressure chronic ulcer of other part of left foot with unspecified severity: Secondary | ICD-10-CM

## 2018-03-27 DIAGNOSIS — Z89432 Acquired absence of left foot: Secondary | ICD-10-CM

## 2018-03-27 DIAGNOSIS — I480 Paroxysmal atrial fibrillation: Secondary | ICD-10-CM | POA: Diagnosis present

## 2018-03-27 DIAGNOSIS — Z79899 Other long term (current) drug therapy: Secondary | ICD-10-CM | POA: Diagnosis not present

## 2018-03-27 DIAGNOSIS — I639 Cerebral infarction, unspecified: Secondary | ICD-10-CM

## 2018-03-27 DIAGNOSIS — R339 Retention of urine, unspecified: Secondary | ICD-10-CM

## 2018-03-27 DIAGNOSIS — R2990 Unspecified symptoms and signs involving the nervous system: Secondary | ICD-10-CM | POA: Diagnosis present

## 2018-03-27 HISTORY — DX: Cerebral infarction, unspecified: I63.9

## 2018-03-27 LAB — GLUCOSE, CAPILLARY
Glucose-Capillary: 172 mg/dL — ABNORMAL HIGH (ref 70–99)
Glucose-Capillary: 245 mg/dL — ABNORMAL HIGH (ref 70–99)
Glucose-Capillary: 126 mg/dL — ABNORMAL HIGH (ref 70–99)
Glucose-Capillary: 223 mg/dL — ABNORMAL HIGH (ref 70–99)

## 2018-03-27 MED ORDER — FLUOXETINE HCL 20 MG PO CAPS
20.0000 mg | ORAL_CAPSULE | Freq: Every day | ORAL | Status: DC
  Administered 2018-03-28 – 2018-04-01 (×5): 20 mg via ORAL
  Filled 2018-03-27 (×5): qty 1

## 2018-03-27 MED ORDER — INSULIN ASPART 100 UNIT/ML ~~LOC~~ SOLN
0.0000 [IU] | Freq: Every day | SUBCUTANEOUS | Status: DC
  Administered 2018-03-27: 2 [IU] via SUBCUTANEOUS

## 2018-03-27 MED ORDER — CEFDINIR 300 MG PO CAPS
300.0000 mg | ORAL_CAPSULE | Freq: Two times a day (BID) | ORAL | Status: DC
  Filled 2018-03-27: qty 1

## 2018-03-27 MED ORDER — FAMOTIDINE 20 MG PO TABS
20.0000 mg | ORAL_TABLET | Freq: Two times a day (BID) | ORAL | Status: DC
  Administered 2018-03-27 – 2018-04-01 (×10): 20 mg via ORAL
  Filled 2018-03-27 (×10): qty 1

## 2018-03-27 MED ORDER — INSULIN ASPART 100 UNIT/ML ~~LOC~~ SOLN: 0.0000 [IU] | Freq: Three times a day (TID) | SUBCUTANEOUS | Status: DC

## 2018-03-27 MED ORDER — DILTIAZEM HCL ER COATED BEADS 120 MG PO CP24
240.0000 mg | ORAL_CAPSULE | Freq: Every day | ORAL | Status: DC
  Administered 2018-03-28 – 2018-04-01 (×5): 240 mg via ORAL
  Filled 2018-03-27: qty 1
  Filled 2018-03-27: qty 2
  Filled 2018-03-27: qty 1
  Filled 2018-03-27 (×3): qty 2

## 2018-03-27 MED ORDER — ROSUVASTATIN CALCIUM 10 MG PO TABS
5.0000 mg | ORAL_TABLET | Freq: Every day | ORAL | Status: DC
  Administered 2018-03-27 – 2018-03-31 (×5): 5 mg via ORAL
  Filled 2018-03-27 (×5): qty 1

## 2018-03-27 MED ORDER — TAMSULOSIN HCL 0.4 MG PO CAPS
0.4000 mg | ORAL_CAPSULE | Freq: Every day | ORAL | Status: DC
  Administered 2018-03-28 – 2018-03-29 (×2): 0.4 mg via ORAL
  Filled 2018-03-27: qty 1

## 2018-03-27 MED ORDER — INSULIN ASPART 100 UNIT/ML ~~LOC~~ SOLN
0.0000 [IU] | Freq: Three times a day (TID) | SUBCUTANEOUS | Status: DC
  Administered 2018-03-27 – 2018-03-28 (×2): 1 [IU] via SUBCUTANEOUS
  Administered 2018-03-28: 2 [IU] via SUBCUTANEOUS
  Administered 2018-03-29: 1 [IU] via SUBCUTANEOUS
  Administered 2018-03-29 – 2018-03-31 (×3): 2 [IU] via SUBCUTANEOUS
  Administered 2018-04-01: 1 [IU] via SUBCUTANEOUS

## 2018-03-27 MED ORDER — LINAGLIPTIN 5 MG PO TABS
5.0000 mg | ORAL_TABLET | Freq: Every day | ORAL | Status: DC
  Administered 2018-03-28 – 2018-03-29 (×2): 5 mg via ORAL
  Filled 2018-03-27 (×2): qty 1

## 2018-03-27 MED ORDER — SODIUM CHLORIDE 0.9 % IV SOLN
2.0000 g | INTRAVENOUS | Status: DC
  Administered 2018-03-28 – 2018-04-01 (×5): 2 g via INTRAVENOUS
  Filled 2018-03-27 (×5): qty 20

## 2018-03-27 MED ORDER — INSULIN GLARGINE 100 UNIT/ML ~~LOC~~ SOLN
5.0000 [IU] | Freq: Every day | SUBCUTANEOUS | Status: AC
Start: 2018-03-28 — End: 2018-03-28
  Administered 2018-03-28: 5 [IU] via SUBCUTANEOUS
  Filled 2018-03-27: qty 0.05

## 2018-03-27 MED ORDER — LIDOCAINE HCL URETHRAL/MUCOSAL 2 % EX GEL
CUTANEOUS | Status: DC | PRN
  Filled 2018-03-27: qty 5

## 2018-03-27 MED ORDER — CEFDINIR 300 MG PO CAPS: 300.0000 mg | ORAL_CAPSULE | Freq: Two times a day (BID) | ORAL | Status: DC

## 2018-03-27 MED ORDER — METFORMIN HCL 500 MG PO TABS
1000.0000 mg | ORAL_TABLET | Freq: Two times a day (BID) | ORAL | Status: DC
  Administered 2018-03-27 – 2018-04-01 (×10): 1000 mg via ORAL
  Filled 2018-03-27 (×10): qty 2

## 2018-03-27 MED ORDER — APIXABAN 5 MG PO TABS
5.0000 mg | ORAL_TABLET | Freq: Two times a day (BID) | ORAL | Status: DC
  Administered 2018-03-27 – 2018-04-01 (×10): 5 mg via ORAL
  Filled 2018-03-27 (×10): qty 1

## 2018-03-27 MED ORDER — ADULT MULTIVITAMIN W/MINERALS CH
1.0000 | ORAL_TABLET | Freq: Every day | ORAL | Status: DC
  Administered 2018-03-28 – 2018-04-01 (×5): 1 via ORAL
  Filled 2018-03-27 (×5): qty 1

## 2018-03-27 MED ORDER — METOPROLOL SUCCINATE ER 50 MG PO TB24
150.0000 mg | ORAL_TABLET | Freq: Every day | ORAL | Status: DC
  Administered 2018-03-28 – 2018-04-01 (×5): 150 mg via ORAL
  Filled 2018-03-27 (×7): qty 3

## 2018-03-27 MED ORDER — INSULIN GLARGINE 100 UNIT/ML ~~LOC~~ SOLN: 15.0000 [IU] | Freq: Every day | SUBCUTANEOUS | Status: DC

## 2018-03-27 MED ORDER — INSULIN ASPART 100 UNIT/ML ~~LOC~~ SOLN: 0.0000 [IU] | Freq: Every day | SUBCUTANEOUS | Status: DC

## 2018-03-27 MED ORDER — OXYCODONE HCL 5 MG/5ML PO SOLN: 5.0000 mg | Freq: Once | ORAL | Status: DC | PRN

## 2018-03-27 MED ORDER — GLIMEPIRIDE 1 MG PO TABS
2.0000 mg | ORAL_TABLET | Freq: Every day | ORAL | Status: DC
  Administered 2018-03-28 – 2018-03-29 (×2): 2 mg via ORAL
  Filled 2018-03-27: qty 1
  Filled 2018-03-27: qty 2

## 2018-03-27 MED ORDER — INSULIN GLARGINE 100 UNIT/ML ~~LOC~~ SOLN
15.0000 [IU] | Freq: Every day | SUBCUTANEOUS | Status: AC
  Administered 2018-03-27: 15 [IU] via SUBCUTANEOUS
  Filled 2018-03-27: qty 0.15

## 2018-03-27 MED ORDER — OXYCODONE HCL 5 MG PO TABS: 5.0000 mg | ORAL_TABLET | Freq: Once | ORAL | Status: DC | PRN

## 2018-03-27 NOTE — H&P (Signed)
Physical Medicine and Rehabilitation Admission H&P    Chief Complaint  Patient presents with  . Functional deficits    HPI:  Kevin [name] is a 77 year old male with history of T2DM with neuropathic ulcer, HTN, ulcerative colitis who was admitted on 03/16/18 after found down with left sided weakness, left facial droop and expressive deficits. CT head negative and he received tPA.  Next day he was found to have tachycardia with tachypnea as well as foul smelling drainage from left foot ulcer.  He was started on broad spectrum antibiotics due to sepsis--wife reported issues with confusion prior to admission.  Cardiology consulted due to elevated troponin's and tachycardia. Elevated trops felt to be due to foot infection and 2 D echo done revealing EF 60-65% with no wall abnormality.  MRI brain showed no acute abnormality with mild small vessel disease.  He did developed new onset A fib and was started on apixaban on 06/24.  Plans for cardioversion in the future.  Mesalamine placed on hold at this time.   ID consulted for input and recommended 6 weeks of IV ceftriaxone due to concerns of osteomyelitis and patient underwent I & D left foot ulcer by Dr. Stann Mainland.  He continued to have persistent drainage with cellulitis and developed abscess under 2nd MT head with evidence of osteomyelitis. 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 To be TDWB on heel to NWB LLE for about 2 weeks and VAC to stay in place for a week. ID recommends changing patient to one week of cephalosporin at discharge due to transmet amputation and surgical cultures with group C strep and coag negative staph.  Therapy ongoing and CIR recommended due to functional deficits.      Review of Systems  Constitutional: Negative for chills and fever.  HENT: Negative for hearing loss and tinnitus.   Eyes: Negative for blurred vision and double vision.  Respiratory: Negative for cough  and shortness of breath.   Cardiovascular: Negative for chest pain, palpitations and leg swelling.  Gastrointestinal: Negative for abdominal pain, constipation, heartburn and nausea.       Bowel movements with urination.   Genitourinary: Positive for frequency and urgency.       3 UTIs recently. Urinates every hour at night and bladder incontinence recently.   Musculoskeletal: Negative for joint pain and myalgias.  Skin: Negative for itching and rash.  Neurological: Positive for weakness. Negative for dizziness, speech change and headaches.  Psychiatric/Behavioral: Negative for depression. The patient is not nervous/anxious.   All other systems reviewed and are negative.   Past Medical History:  Diagnosis Date  . Diabetes mellitus without complication ([place])   . Hypertension     Past Surgical History:  Procedure Laterality Date  . AMPUTATION Left 03/24/2018   Procedure: Transmetatarsal Amputation Left Foot;  Surgeon: [name], MD;  Location: [facility];  Service: Orthopedics;  Laterality: Left;  . I&D EXTREMITY Left 03/17/2018   Procedure: IRRIGATION AND DEBRIDEMENT FOOT;  Surgeon: [name], MD;  Location: [facility];  Service: Orthopedics;  Laterality: Left;  . WRIST SURGERY      Family History  Problem Relation Age of Onset  . Hypertension Father     Social History:  Married. Retied. Independent without AD. Goes to gym 3 times a week--treadmill and abs. He  reports that he has never smoked. He has never used smokeless tobacco. He reports that he drinks alcohol--couple of beers  on the weekend. He reports that he does not use drugs.   Allergies  Allergen Reactions  . Sulfa Antibiotics Rash   Medications Prior to Admission  Medication Sig Dispense Refill  . aspirin EC 81 MG tablet Take 81 mg by mouth daily.    . canagliflozin (INVOKANA) 100 MG TABS tablet Take 100 mg by mouth daily before breakfast.    . FLUoxetine (PROZAC) 20 MG capsule Take 20 mg by mouth daily.      Marland Kitchen glimepiride (AMARYL) 4 MG tablet Take 2 mg by mouth daily with breakfast.    . mesalamine (APRISO) 0.375 g 24 hr capsule Take 750 mg by mouth 2 (two) times daily.     . metFORMIN (GLUCOPHAGE) 500 MG tablet Take 1,000 mg by mouth 2 (two) times daily with a meal.     . metoprolol succinate (TOPROL-XL) 100 MG 24 hr tablet Take 100 mg by mouth daily. Take with or immediately following a meal.    . Multiple Vitamin (MULTIVITAMIN WITH MINERALS) TABS tablet Take 1 tablet by mouth daily.    . rosuvastatin (CRESTOR) 10 MG tablet Take 10 mg by mouth daily.    . sitaGLIPtin (JANUVIA) 100 MG tablet Take 100 mg by mouth daily.    . tamsulosin (FLOMAX) 0.4 MG CAPS capsule Take 0.4 mg by mouth daily after breakfast.       Drug Regimen Review  Drug regimen was reviewed and remains appropriate with no significant issues identified  Home: Home Living Family/patient expects to be discharged to:: Private residence Living Arrangements: Spouse/significant other Available Help at Discharge: Family, Friend(s), Available 24 hours/day Type of Home: House Home Access: Stairs to enter Technical brewer of Steps: 2 Entrance Stairs-Rails: Right, Left, Can reach both Home Layout: One level Bathroom Shower/Tub: Multimedia programmer: Programmer, systems: Yes Home Equipment: None  Lives With: Spouse   Functional History: Prior Function Level of Independence: Independent Comments: ADls, IADLs, driving, retired  Functional Status:  Mobility: Bed Mobility Overal bed mobility: Needs Assistance Bed Mobility: Supine to Sit, Sit to Supine Supine to sit: Supervision Sit to supine: Supervision General bed mobility comments: for safety Transfers Overall transfer level: Needs assistance Equipment used: Rolling walker (2 wheeled) Transfers: Sit to/from Stand, [employer] Transfers Sit to Stand: Min guard Stand pivot transfers: Min assist General transfer comment: Min A for immediate  standing balance/steadying; verbal cueing for hand placement and WB status Ambulation/Gait Ambulation/Gait assistance: Min assist Gait Distance (Feet): 10 Feet(x2) Assistive device: Rolling walker (2 wheeled) Gait Pattern/deviations: Step-to pattern(hop to pattern due to TDWB orders) General Gait Details: verbal cueing for WB status, upright posture, and sequenicng with AD Gait velocity: decreased Stairs: Yes Stairs assistance: Min assist Stair Management: Two rails, Alternating pattern General stair comments: pt is very impulsive when attempting stair training and not listending to cues for safety or technique to practice simulating home entrance; assist for balance and safety    ADL: ADL Overall ADL's : Needs assistance/impaired Eating/Feeding: NPO Grooming: Wash/dry hands, Set up, Sitting Grooming Details (indicate cue type and reason): EOB Upper Body Bathing: Minimal assistance, Sitting Lower Body Bathing: Moderate assistance, Sit to/from stand Upper Body Dressing : Minimal assistance, Sitting Lower Body Dressing: Moderate assistance, Sitting/lateral leans Lower Body Dressing Details (indicate cue type and reason): ton don darco shoe Toilet Transfer: Min guard, Cueing for safety, Cueing for sequencing, Stand-pivot, BSC, RW Toilet Transfer Details (indicate cue type and reason): vc for sequencing and for WB status to be maintained.  Toileting- Clothing Manipulation and Hygiene: Maximal assistance, Sit to/from stand Toileting - Clothing Manipulation Details (indicate cue type and reason): Pt required assistance for rear peri care for throughness, dependent on RW for balance Functional mobility during ADLs: Minimal assistance, Min guard, Rolling walker, Cueing for safety General ADL Comments: multimodal cues for WB  Cognition: Cognition Overall Cognitive Status: Impaired/Different from baseline Arousal/Alertness: Awake/alert Orientation Level: Oriented X4 Memory: Appears  intact(Pt at baseline) Awareness: Appears intact Problem Solving: Appears intact Behaviors: Other (comment)(anxious) Safety/Judgment: Appears intact Cognition Arousal/Alertness: Awake/alert Behavior During Therapy: Impulsive Overall Cognitive Status: Impaired/Different from baseline Area of Impairment: Safety/judgement, Following commands, Problem solving Orientation Level: Disoriented to, Time, Situation Memory: Decreased recall of precautions Following Commands: Follows one step commands with increased time Safety/Judgement: Decreased awareness of safety, Decreased awareness of deficits Awareness: Emergent Problem Solving: Difficulty sequencing, Requires verbal cues, Requires tactile cues General Comments: very impulsive with all mobility increasing his fall risk; verbal cueing throughout session for safety  Physical Exam: Blood pressure 136/80, pulse 97, temperature 98.2 F (36.8 C), temperature source Oral, resp. rate 18, height '6\' 1"'  (1.854 m), weight 97.8 kg (215 lb 9.8 oz), SpO2 97 %. Physical Exam  Nursing note and vitals reviewed. Constitutional: He is oriented to person, place, and time. He appears well-developed and well-nourished.  HENT:  Head: Normocephalic and atraumatic.  Eyes: EOM are normal. Right eye exhibits no discharge. Left eye exhibits no discharge.  Neck: Normal range of motion. Neck supple.  Cardiovascular:  Irregularly irregular  Respiratory: Effort normal and breath sounds normal.  GI: Soft. Bowel sounds are normal.  Small reducible umbilical hernia.   Musculoskeletal:  Left foot tenderness  Neurological: He is alert and oriented to person, place, and time.  Motor: Right upper extremity/right lower extremity: Pulses 5 proximal distal Left upper trunk: Shoulder abduction, elbow flexion 4+/5, elbow extension 4-/5, wrist extension 2/5, hand intrinsics 2+/5 Upper extremity: Hip flexion and knee extension 4+-5/5  Skin:  Scattered hematomas: right pectoral  area, right flank, left flank and left shoulder.  Prevena Vac in place on left foot.   Psychiatric: He has a normal mood and affect. His behavior is normal.    Results for orders placed or performed during the hospital encounter of 03/16/18 (from the past 48 hour(s))  Glucose, capillary     Status: Abnormal   Collection Time: 03/25/18  4:02 PM  Result Value Ref Range   Glucose-Capillary 204 (H) 70 - 99 mg/dL  Glucose, capillary     Status: Abnormal   Collection Time: 03/25/18 10:15 PM  Result Value Ref Range   Glucose-Capillary 253 (H) 70 - 99 mg/dL   Comment 1 Notify RN    Comment 2 Document in Chart   Basic metabolic panel     Status: Abnormal   Collection Time: 03/26/18  5:00 AM  Result Value Ref Range   Sodium 135 135 - 145 mmol/L   Potassium 4.1 3.5 - 5.1 mmol/L   Chloride 101 98 - 111 mmol/L    Comment: Please note change in reference range.   CO2 27 22 - 32 mmol/L   Glucose, Bld 179 (H) 70 - 99 mg/dL    Comment: Please note change in reference range.   BUN 12 8 - 23 mg/dL    Comment: Please note change in reference range.   Creatinine, Ser 1.27 (H) 0.61 - 1.24 mg/dL   Calcium 7.7 (L) 8.9 - 10.3 mg/dL   GFR calc non Af Amer 54 (L) >60 mL/min  GFR calc Af Amer >60 >60 mL/min    Comment: (NOTE) The eGFR has been calculated using the CKD EPI equation. This calculation has not been validated in all clinical situations. eGFR's persistently <60 mL/min signify possible Chronic Kidney Disease.    Anion gap 7 5 - 15    Comment: Performed at [facility] [address] [place], [place] [address]  CBC     Status: Abnormal   Collection Time: 03/26/18  5:00 AM  Result Value Ref Range   WBC 13.7 (H) 4.0 - 10.5 K/uL   RBC 3.63 (L) 4.22 - 5.81 MIL/uL   Hemoglobin 10.4 (L) 13.0 - 17.0 g/dL   HCT 32.9 (L) 39.0 - 52.0 %   MCV 90.6 78.0 - 100.0 fL   MCH 28.7 26.0 - 34.0 pg   MCHC 31.6 30.0 - 36.0 g/dL   RDW 13.2 11.5 - 15.5 %   Platelets 604 (H) 150 - 400 K/uL     Comment: Performed at [facility], [place] [address] [place], [place] [address]  Glucose, capillary     Status: Abnormal   Collection Time: 03/26/18  6:32 AM  Result Value Ref Range   Glucose-Capillary 174 (H) 70 - 99 mg/dL  Glucose, capillary     Status: Abnormal   Collection Time: 03/26/18 11:22 AM  Result Value Ref Range   Glucose-Capillary 217 (H) 70 - 99 mg/dL  Glucose, capillary     Status: Abnormal   Collection Time: 03/26/18  3:59 PM  Result Value Ref Range   Glucose-Capillary 163 (H) 70 - 99 mg/dL   Comment 1 Notify RN    Comment 2 Document in Chart   Glucose, capillary     Status: Abnormal   Collection Time: 03/26/18  9:51 PM  Result Value Ref Range   Glucose-Capillary 173 (H) 70 - 99 mg/dL   Comment 1 Notify RN    Comment 2 Document in Chart   Glucose, capillary     Status: Abnormal   Collection Time: 03/27/18  6:36 AM  Result Value Ref Range   Glucose-Capillary 172 (H) 70 - 99 mg/dL  Glucose, capillary     Status: Abnormal   Collection Time: 03/27/18 11:05 AM  Result Value Ref Range   Glucose-Capillary 245 (H) 70 - 99 mg/dL   No results found.   Medical Problem List and Plan: 1.  Deficits with mobility, transfers, self-care secondary to left transmetatarsal amputation. 2.  DVT Prophylaxis/Anticoagulation: Pharmaceutical: Other (comment)--eliquis 3. Pain Management:  Oxycodone prn effective.  4. Mood: LCSW to follow for evaluation and support.  5. Neuropsych: This patient is capable of making decisions on his own behalf. 6. Skin/Wound Care:  Continue Prevena VAC till  7/5. To be NWB as unable to maintain TDWB.  7. Fluids/Electrolytes/Nutrition: Monitor I/O. Check lytes in am. 8. Diabetic foot ulcer/ s/p transmetatarsal amputation: ID recommended IV rocephin  D# 7-- while in hospital followed by a  week of ceftin at discharge.    9. T2 DM: Continue Lantus--wean off over next few days.  Resume Janvia and metformin for tighter control. Continue to hold Invokana  and Amaryl for now.  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 Will monitor voiding with PVR checks and resume Flomax.  11. New onset A fib: Monitor HR bid--continue metoprolol with cardizem CD 240 mg daily. On eliquis.  12. Bowel incontinence: Question due to antibiotics ---will discontinue colace. Monitor for improvement.  Post Admission Physician Evaluation: 1. Preadmission assessment reviewed and changes made below. 2. Functional deficits secondary  to left transmetatarsal amputation. 3. Patient is admitted to receive collaborative, interdisciplinary care between the physiatrist, rehab nursing staff, and therapy team. 4. Patient's level of medical complexity and substantial therapy needs in context of that medical necessity cannot be provided at a lesser intensity of care such as a SNF. 5. Patient has experienced substantial functional loss from his/her baseline which was documented above under the "Functional History" and "Functional Status" headings.  Judging by the patient's diagnosis, physical exam, and functional history, the patient has potential for functional progress which will result in measurable gains while on inpatient rehab.  These gains will be of substantial and practical use upon discharge  in facilitating mobility and self-care at the household level. 6. Physiatrist will provide 24 hour management of medical needs as well as oversight of the therapy plan/treatment and provide guidance as appropriate regarding the interaction of the two. 7. 24 hour rehab nursing will assist with bladder management, safety, skin/wound care, disease management, pain management and patient education  and help integrate therapy concepts, techniques,education, etc. 8. PT will assess and treat for/with: Lower extremity strength, range of motion, stamina, balance, functional mobility, safety, adaptive techniques and equipment, wound care, coping  skills, pain control, education. Goals are: Supervision. 9. OT will assess and treat for/with: ADL's, functional mobility, safety, upper extremity strength, adaptive techniques and equipment, wound mgt, ego support, and community reintegration.   Goals are: supervision. Therapy may not proceed with showering this patient. 10. Case Management and Social Worker will assess and treat for psychological issues and discharge planning. 11. Team conference will be held weekly to assess progress toward goals and to determine barriers to discharge. 12. Patient will receive at least 3 hours of therapy per day at least 5 days per week. 13. ELOS: 4-6 days.       14. Prognosis:  excellent and good  I have personally performed a face to face diagnostic evaluation, including, but not limited to relevant history and physical exam findings, of this patient and developed relevant assessment and plan.  Additionally, I have reviewed and concur with the physician assistant's documentation above.  Delice Lesch, MD, ABPMR Bary Leriche, PA-C 03/27/2018

## 2018-03-27 NOTE — Progress Notes (Signed)
Pt being discharged from unit and transferred to inpatient rehab per orders from MD. Pt educated on discharge instructions. Pt verbalized understanding of instructions. All questions and concerns were addressed. Pt's peripheral IV was removed prior to discharge.

## 2018-03-27 NOTE — Progress Notes (Signed)
Kevin Arn, MD  Physician  Physical Medicine and Rehabilitation  PMR Pre-admission  Addendum  Date of Service:  03/27/2018 12:30 PM       Related encounter: ED to Hosp-Admission (Discharged) from 03/16/2018 in [facility]           Show:Clear all [x] Manual[x] Template[x] Copied  Added by: [x] Cristina Gong, RN[x] Kevin Arn, MD   [] Hover for details   PMR Admission Coordinator Pre-Admission Assessment  Patient: Kevin [name] is an 77 y.o., male MRN: [id number] DOB: October 01, 1940 Height: 6\' 1"  (185.4 cm) Weight: 97.8 kg (215 lb 9.8 oz)                                                                                                                                                  Insurance Information HMO:     PPO:      PCP:      IPA:      80/20:      OTHER: no HMO PRIMARY: Medicare a and b      Policy#: [id number]      Subscriber: pt Benefits:  Phone #: passport one online     Name:  Eff. Date: 08/27/2008     Deduct: $1364      Out of Pocket Max: none      Life Max: none CIR: 100%      SNF: 20 full days Outpatient: 80%     Co-Pay: 20% Home Health: 100%      Co-Pay: non DME: 80%     Co-Pay: 20% Providers: pt choice  SECONDARY: [employer]      Policy#: [id number]      Subscriber: pt  Medicaid Application Date:       Case Manager:  Disability Application Date:       Case Worker:   Emergency Lynn    Name Relation Home Work [place] [phone]  [phone]     Current Medical History  Patient Admitting Diagnosis: transmetatarsal amputation of left foot  History of Present Illness: Kevin [name] is a 77 year old male with history of T2DM with neuropathic ulcer, HTN, ulcerative colitis who was admitted on 03/16/18 after found down with left sided weakness, left facial droop and expressive deficits. CT head negative and he received tPA. Next day he  was found to have tachycardia with tachypnea as well as foul smelling drainage from left foot ulcer. He was started on broad spectrum antibiotics due to sepsis--wife reported issues with confusion prior to admission. Cardiology consulted due to elevated troponin's and tachycardia. Elevated trops felt to be due to CVA and 2 D echo done revealing EF 60-65% with no wall abnormality. MRI brain showed no acute abnormality with mild small vessel disease. He did  developed new onset A fib and was started on apixaban on 06/24.   ID consulted for input and recommended 6 weeks of IV ceftriaxone due to concerns of osteomyelitis and patient underwent I &D left foot ulcer by Dr. Stann Mainland. He continued to have persistent drainage with cellulitis and developed abscess under 2nd MT head with evidence of osteomyelitis. [name] Given consulted for input and patient underwent L-transmetatarsal amputation on 06/29 per recommendations.      Total: 2 NIHSS  Past Medical History      Past Medical History:  Diagnosis Date  . Diabetes mellitus without complication ([place])   . Hypertension     Family History  family history includes Hypertension in his father.  Prior Rehab/Hospitalizations:  Has the patient had major surgery during 100 days prior to admission? No  Current Medications   Current Facility-Administered Medications:  .  [DISCONTINUED] acetaminophen (TYLENOL) tablet 650 mg, 650 mg, Oral, Q4H PRN **OR** [DISCONTINUED] acetaminophen (TYLENOL) solution 650 mg, 650 mg, Per Tube, Q4H PRN **OR** acetaminophen (TYLENOL) suppository 650 mg, 650 mg, Rectal, Q4H PRN, [name], MD .  acetaminophen (TYLENOL) tablet 325-650 mg, 325-650 mg, Oral, Q6H PRN, [name], MD .  apixaban Arne [facility]) tablet 5 mg, 5 mg, Oral, BID, [name], MD, 5 mg at 03/27/18 1111 .  bisacodyl (DULCOLAX) suppository 10 mg, 10 mg, Rectal, Daily PRN, [name], MD .  cefdinir (OMNICEF) capsule 300 mg, 300 mg,  Oral, Q12H, Domenic Polite, MD .  diltiazem (CARDIZEM CD) 24 hr capsule 240 mg, 240 mg, Oral, Daily, [name], MD, 240 mg at 03/27/18 1110 .  docusate sodium (COLACE) capsule 100 mg, 100 mg, Oral, BID, [name], MD, 100 mg at 03/27/18 1111 .  famotidine (PEPCID) tablet 20 mg, 20 mg, Oral, BID, [name], MD, 20 mg at 03/27/18 1111 .  FLUoxetine (PROZAC) capsule 20 mg, 20 mg, Oral, Daily, [name], MD, 20 mg at 03/27/18 1111 .  HYDROcodone-acetaminophen (NORCO/VICODIN) 5-325 MG per tablet 1-2 tablet, 1-2 tablet, Oral, Q4H PRN, [name], MD, 2 tablet at 03/26/18 0100 .  insulin aspart (novoLOG) injection 0-15 Units, 0-15 Units, Subcutaneous, TID WC, [name], MD, 5 Units at 03/27/18 1211 .  insulin aspart (novoLOG) injection 0-5 Units, 0-5 Units, Subcutaneous, QHS, [name], MD, 3 Units at 03/25/18 2219 .  insulin glargine (LANTUS) injection 30 Units, 30 Units, Subcutaneous, QHS, Domenic Polite, MD, 30 Units at 03/26/18 2214 .  magnesium citrate solution 1 Bottle, 1 Bottle, Oral, Once PRN, [name], MD .  metoprolol succinate (TOPROL-XL) 24 hr tablet 150 mg, 150 mg, Oral, Daily, [name], MD, 150 mg at 03/27/18 1110 .  metoprolol succinate (TOPROL-XL) 24 hr tablet 50 mg, 50 mg, Oral, Once, [name], MD .  metoprolol tartrate (LOPRESSOR) injection 5 mg, 5 mg, Intravenous, Q4H PRN, [name], MD, 5 mg at 03/20/18 0053 .  morphine 2 MG/ML injection 0.5-1 mg, 0.5-1 mg, Intravenous, Q2H PRN, [name], MD .  ondansetron [facility]) tablet 4 mg, 4 mg, Oral, Q6H PRN **OR** ondansetron (ZOFRAN) injection 4 mg, 4 mg, Intravenous, Q6H PRN, [name], MD .  polyethylene glycol (MIRALAX / GLYCOLAX) packet 17 g, 17 g, Oral, Daily PRN, [name], MD .  rosuvastatin (CRESTOR) tablet 5 mg, 5 mg, Oral, q1800, [name], MD, 5 mg at 03/26/18 1705 .  senna-docusate (Senokot-S) tablet 1 tablet, 1 tablet, Oral, QHS  PRN, [name],  MD  Patients Current Diet:       Diet Order           Diet Carb Modified Fluid consistency: Thin; Room service appropriate? Yes  Diet effective now          Precautions / Restrictions Precautions Precautions: Fall Precaution Comments: TDWB LLE Other Brace/Splint: Darco shoe Restrictions Weight Bearing Restrictions: Yes LLE Weight Bearing: Touchdown weight bearing LLE Partial Weight Bearing Percentage or Pounds: heel Other Position/Activity Restrictions: Darco Shoe   Has the patient had 2 or more falls or a fall with injury in the past year?No  Prior Activity Level Community (5-7x/wk): Independent and active pta; drove; retired Event organiser / Paramedic Devices/Equipment: None Home Equipment: None  Prior Device Use: Indicate devices/aids used by the patient prior to current illness, exacerbation or injury? None of the above  Prior Functional Level Prior Function Level of Independence: Independent Comments: ADls, IADLs, driving, retired  Self Care: Did the patient need help bathing, dressing, using the toilet or eating?  Independent  Indoor Mobility: Did the patient need assistance with walking from room to room (with or without device)? Independent  Stairs: Did the patient need assistance with internal or external stairs (with or without device)? Independent  Functional Cognition: Did the patient need help planning regular tasks such as shopping or remembering to take medications? Independent  Current Functional Level Cognition  Arousal/Alertness: Awake/alert Overall Cognitive Status: Impaired/Different from baseline Orientation Level: Oriented X4 Following Commands: Follows one step commands with increased time Safety/Judgement: Decreased awareness of safety, Decreased awareness of deficits General Comments: very impulsive with all mobility increasing his fall risk; verbal cueing throughout session for  safety Memory: Appears intact(Pt at baseline) Awareness: Appears intact Problem Solving: Appears intact Behaviors: Other (comment)(anxious) Safety/Judgment: Appears intact    Extremity Assessment (includes Sensation/Coordination)  Upper Extremity Assessment: LUE deficits/detail LUE Deficits / Details: ROM and strength approaching baseline - sore from fall but improving LUE Coordination: decreased gross motor  Lower Extremity Assessment: LLE deficits/detail LLE Deficits / Details: new transmet amputation at foot LLE: Unable to fully assess due to pain LLE Coordination: decreased gross motor    ADLs  Overall ADL's : Needs assistance/impaired Eating/Feeding: NPO Grooming: Wash/dry hands, Set up, Sitting Grooming Details (indicate cue type and reason): EOB Upper Body Bathing: Minimal assistance, Sitting Lower Body Bathing: Moderate assistance, Sit to/from stand Upper Body Dressing : Minimal assistance, Sitting Lower Body Dressing: Moderate assistance, Sitting/lateral leans Lower Body Dressing Details (indicate cue type and reason): ton don darco shoe Toilet Transfer: Min guard, Cueing for safety, Cueing for sequencing, Stand-pivot, BSC, RW Toilet Transfer Details (indicate cue type and reason): vc for sequencing and for WB status to be maintained.  Toileting- Clothing Manipulation and Hygiene: Maximal assistance, Sit to/from stand Toileting - Clothing Manipulation Details (indicate cue type and reason): Pt required assistance for rear peri care for throughness, dependent on RW for balance Functional mobility during ADLs: Minimal assistance, Min guard, Rolling walker, Cueing for safety General ADL Comments: multimodal cues for WB    Mobility  Overal bed mobility: Needs Assistance Bed Mobility: Supine to Sit, Sit to Supine Supine to sit: Supervision Sit to supine: Supervision General bed mobility comments: for safety    Transfers  Overall transfer level: Needs  assistance Equipment used: Rolling walker (2 wheeled) Transfers: Sit to/from Stand, [employer] Transfers Sit to Stand: Min guard Stand pivot transfers: Min assist General transfer comment: Min A  for immediate standing balance/steadying; verbal cueing for hand placement and WB status    Ambulation / Gait / Stairs / Wheelchair Mobility  Ambulation/Gait Ambulation/Gait assistance: Herbalist (Feet): 10 Feet(x2) Assistive device: Rolling walker (2 wheeled) Gait Pattern/deviations: Step-to pattern(hop to pattern due to TDWB orders) General Gait Details: verbal cueing for WB status, upright posture, and sequenicng with AD Gait velocity: decreased Stairs: Yes Stairs assistance: Min assist Stair Management: Two rails, Alternating pattern General stair comments: pt is very impulsive when attempting stair training and not listending to cues for safety or technique to practice simulating home entrance; assist for balance and safety    Posture / Balance Balance Overall balance assessment: Needs assistance Sitting-balance support: No upper extremity supported, Feet supported Sitting balance-Leahy Scale: Good Standing balance support: Bilateral upper extremity supported, During functional activity Standing balance-Leahy Scale: Poor Standing balance comment: dependent on RW for support    Special needs/care consideration BiPAP/CPAP  N/a CPM n/a Continuous Drip IV n/a Dialysis  N/a Life Vest n/a Oxygen n/a Special Bed n/a Trach Size n/a Wound Vac wound VAC placed by [name] Given during surgery  Skin right knee abrasion; ecchymosis to bilateral hips and flank Bowel mgmt: incontinent when he bears down to void due to prostate issues Bladder mgmt: using urinal Diabetic mgmt  Yes pta Prior to last surgery 6/28, pt was to d/c home with iv antibiotics and PICC. Wife to administer and [place] was to follow at home    Previous [place]:  Spouse/significant other  Lives With: Spouse Available Help at Discharge: Family, Friend(s), Available 24 hours/day Type of Home: House Home Layout: One level Home Access: Stairs to enter Entrance Stairs-Rails: Right, Left, Can reach both Entrance Stairs-Number of Steps: 2 Bathroom Shower/Tub: Multimedia programmer: Standard Bathroom Accessibility: Yes How Accessible: Accessible via walker [place]: No  Discharge Living Setting Plans for Discharge Living Setting: Patient's home, Lives with (comment)(wife) Type of Home at Discharge: House Discharge Home Layout: One level Discharge Home Access: Stairs to enter Entrance Stairs-Rails: Right, Left, Can reach both Entrance Stairs-Number of Steps: 2 Discharge Bathroom Shower/Tub: Walk-in shower Discharge Bathroom Toilet: Standard Discharge Bathroom Accessibility: Yes How Accessible: Accessible via walker Does the patient have any problems obtaining your medications?: No  Social/Family/Support Systems Patient Roles: Spouse Contact Information: [name], wife Anticipated Caregiver: wife Anticipated Ambulance person Information: see above Ability/Limitations of Caregiver: retired; 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 Discharge Plan Discussed with Primary Caregiver: Yes Is Caregiver In Agreement with Plan?: Yes Does Caregiver/Family have Issues with Lodging/Transportation while Pt is in Rehab?: No   Goals/Additional Needs Patient/Family Goal for Rehab: supervision PT, supervision to min assist OT, supervision SLP Expected length of stay: ELOS 6 to 10 days Equipment Needs: wound VAC Additional Information: Patient with decreased safety awareness; minimizes defcits Pt/Family Agrees to Admission and willing to participate: Yes Program Orientation Provided & Reviewed with Pt/Caregiver Including Roles  & Responsibilities: Yes  Decrease burden of Care through IP rehab admission: n/a  Possible need for SNF  placement upon discharge: not anticipated  Patient Condition: This patient's medical and functional status has changed since the consult dated 03/21/2018 in which the Rehabilitation Physician documented that the patient was not appropriate for intensive rehabilitative care in an inpatient rehabilitation facility. Due to change in status and issues being addressed, patient's case has been discussed with Dr. Posey Pronto and patient now appropriate for inpatient rehabilitation. Will admit to inpatient rehab today.  Preadmission Screen  Completed By:  Cleatrice Burke, 03/27/2018 1:43 PM ______________________________________________________________________   Discussed status with Dr. Posey Pronto on 03/27/2018 at  1343 and received telephone approval for admission today.  Admission Coordinator:  Cleatrice Burke, time 3539 Date 03/27/2018         Revision History

## 2018-03-27 NOTE — Progress Notes (Signed)
Patient admitted at 1555 from [address] alert and oriented x 4. Left foot wound vac intact. No complaints of pain . Oriented patient and wife to room and call bell system. Reviewed rehab process . Patient and wife verbalized understanding of admission process. continue with plan of care.  Mliss Sax, RN

## 2018-03-27 NOTE — Progress Notes (Signed)
Progress Note  Patient Name: Kevin [name] Date of Encounter: 03/27/2018  Primary Cardiologist: [name]  Subjective   No cardiac complaints.  Status post metatarsal amputation.   Inpatient Medications    Scheduled Meds: . apixaban  5 mg Oral BID  . cefdinir  300 mg Oral Q12H  . diltiazem  240 mg Oral Daily  . docusate sodium  100 mg Oral BID  . famotidine  20 mg Oral BID  . FLUoxetine  20 mg Oral Daily  . insulin aspart  0-15 Units Subcutaneous TID WC  . insulin aspart  0-5 Units Subcutaneous QHS  . insulin glargine  30 Units Subcutaneous QHS  . metoprolol succinate  150 mg Oral Daily  . metoprolol succinate  50 mg Oral Once  . rosuvastatin  5 mg Oral q1800   Continuous Infusions:  PRN Meds: [DISCONTINUED] acetaminophen **OR** [DISCONTINUED] acetaminophen (TYLENOL) oral liquid 160 mg/5 mL **OR** acetaminophen, acetaminophen, bisacodyl, HYDROcodone-acetaminophen, magnesium citrate, metoprolol tartrate, morphine injection, ondansetron **OR** ondansetron (ZOFRAN) IV, polyethylene glycol, senna-docusate   Vital Signs    Vitals:   03/26/18 2355 03/27/18 0416 03/27/18 0823 03/27/18 1202  BP: (!) 141/94 (!) 135/93 (!) 127/58 136/80  Pulse: 89 97 97 97  Resp: 18 18 18 18   Temp: 97.7 F (36.5 C) (!) 97.5 F (36.4 C) 97.9 F (36.6 C) 98.2 F (36.8 C)  TempSrc: Oral Oral Oral Oral  SpO2: 93% 96% 93% 97%  Weight:      Height:        Intake/Output Summary (Last 24 hours) at 03/27/2018 1356 Last data filed at 03/27/2018 1157 Gross per 24 hour  Intake 1420 ml  Output 2805 ml  Net -1385 ml   Filed Weights   03/16/18 2000 03/16/18 2113  Weight: 215 lb 9.8 oz (97.8 kg) 215 lb 9.8 oz (97.8 kg)    Telemetry    AF with controlled rate < 100 bpm - Personally Reviewed  ECG    No new tracing since AF noted on the 03/20/2018 tracing. - Personally Reviewed  Physical Exam   GEN: No acute distress.   Neck: No JVD Cardiac: IIRR, no murmurs, rubs, or gallops.    Respiratory: Clear to auscultation bilaterally. GI: Soft, nontender, non-distended  MS: No edema; No deformity. Neuro:  Nonfocal  Psych: Normal affect   Labs    Chemistry Recent Labs  Lab 03/24/18 0500 03/25/18 0500 03/26/18 0500  NA 139 138 135  K 3.8 3.9 4.1  CL 103 103 101  CO2 27 29 27   GLUCOSE 183* 171* 179*  BUN 16 12 12   CREATININE 1.23 1.22 1.27*  CALCIUM 8.2* 8.2* 7.7*  GFRNONAA 56* 57* 54*  GFRAA >60 >60 >60  ANIONGAP 9 6 7      Hematology Recent Labs  Lab 03/24/18 0500 03/25/18 0500 03/26/18 0500  WBC 14.8* 15.9* 13.7*  RBC 3.69* 3.68* 3.63*  HGB 10.5* 10.5* 10.4*  HCT 33.2* 33.2* 32.9*  MCV 90.0 90.2 90.6  MCH 28.5 28.5 28.7  MCHC 31.6 31.6 31.6  RDW 13.0 13.1 13.2  PLT 617* 621* 604*    Cardiac EnzymesNo results for input(s): TROPONINI in the last 168 hours. No results for input(s): TROPIPOC in the last 168 hours.   BNPNo results for input(s): BNP, PROBNP in the last 168 hours.   DDimer No results for input(s): DDIMER in the last 168 hours.   Radiology    No results found.  Cardiac Studies   2D Doppler echocardiogram 03/17/2018: Study Conclusions   -  Left ventricle: The cavity size was normal. Systolic function was   normal. The estimated ejection fraction was in the range of 60%   to 65%. Wall motion was normal; there were no regional wall   motion abnormalities. There was no evidence of elevated   ventricular filling pressure by Doppler parameters. - Left atrium: The atrium was moderately dilated.   Impressions:   - No cardiac source of emboli was indentified.   Patient Profile     77 y.o. male with PMH of DM and HTN who presented with left sided weakness and facial droop. Admitted and given TPA, and developed freq ectopy overnight. Mildly elevated troponin.   Subsequently developed atrial fibrillation while hospitalized.  Also noted to have osteomyelitis requiring transmetatarsal amputation.  Eliquis started  03/20/2018.   Assessment & Plan    1. Paroxysmal atrial fibrillation, currently on moderate to high-dose beta-blocker and diltiazem with adequate rate control.  Consider electrical cardioversion 3 to 4 weeks after Eliquis was started.  Hopefully atrial fibrillation will spontaneously converted. 2. 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 Patient is without complications.    [facility] will sign off.   Medication Recommendations: Continue the combination Toprol-XL 150 mg daily (for further rate control new that would increase to 200 mg/day) and diltiazem CD 240 mg daily.  Eliquis should be continued indefinitely. Other recommendations (labs, testing, etc): None Follow up as an outpatient: Need to follow-up with Dr. [name] in late July for EKG and to consider setting up elective electrical cardioversion.     For questions or updates, please contact Dewy Rose Please consult [email or web address] for contact info under Cardiology/STEMI.      Signed, Sinclair Grooms, MD  03/27/2018, 1:56 PM

## 2018-03-27 NOTE — Progress Notes (Signed)
Inpatient Rehabilitation Admissions Coordinator  I met with patient at bedside to discuss a possible inpt rehab admit today. He is in agreement but wants to get home ASAP after an inpt rehab admission. I will contact Dr. Broadus John to clarify if he can d/c to CIR today.  Danne Baxter, RN, MSN Rehab Admissions Coordinator [phone] 03/27/2018 12:14 PM

## 2018-03-27 NOTE — PMR Pre-admission (Addendum)
PMR Admission Coordinator Pre-Admission Assessment  Patient: Kevin [name] is an 77 y.o., male MRN: [id number] DOB: 06-02-1941 Height: 6\' 1"  (185.4 cm) Weight: 97.8 kg (215 lb 9.8 oz)              Insurance Information HMO:     PPO:      PCP:      IPA:      80/20:      OTHER: no HMO PRIMARY: Medicare a and b      Policy#: [id number]      Subscriber: pt Benefits:  Phone #: passport one online     Name:  Eff. Date: 08/27/2008     Deduct: $1364      Out of Pocket Max: none      Life Max: none CIR: 100%      SNF: 20 full days Outpatient: 80%     Co-Pay: 20% Home Health: 100%      Co-Pay: non DME: 80%     Co-Pay: 20% Providers: pt choice  SECONDARY: [employer]      Policy#: [id number]      Subscriber: pt  Medicaid Application Date:       Case Manager:  Disability Application Date:       Case Worker:   Emergency [place]    Name Relation Home Work English [phone]  [phone]     Current Medical History  Patient Admitting Diagnosis: transmetatarsal amputation of left foot  History of Present Illness: Kevin [name] is a 77 year old male with history of T2DM with neuropathic ulcer, HTN, ulcerative colitis who was admitted on 03/16/18 after found down with left sided weakness, left facial droop and expressive deficits. CT head negative and he received tPA.  Next day he was found to have tachycardia with tachypnea as well as foul smelling drainage from left foot ulcer.  He was started on broad spectrum antibiotics due to sepsis--wife reported issues with confusion prior to admission.  Cardiology consulted due to elevated troponin's and tachycardia. Elevated trops felt to be due to CVA and 2 D echo done revealing EF 60-65% with no wall abnormality.  MRI brain showed no acute abnormality with mild small vessel disease.  He did developed new onset A fib and was started on apixaban on 06/24.     ID consulted for input and  recommended 6 weeks of IV ceftriaxone due to concerns of osteomyelitis and patient underwent I & D left foot ulcer by Dr. Stann Mainland.  He continued to have persistent drainage with cellulitis and developed abscess under 2nd MT head with evidence of osteomyelitis. [name] Given consulted for input and patient underwent L-transmetatarsal amputation on 06/29 per recommendations.       Total: 2 NIHSS    Past Medical History  Past Medical History:  Diagnosis Date  . Diabetes mellitus without complication ([place])   . Hypertension     Family History  family history includes Hypertension in his father.  Prior Rehab/Hospitalizations:  Has the patient had major surgery during 100 days prior to admission? No  Current Medications   Current Facility-Administered Medications:  .  [DISCONTINUED] acetaminophen (TYLENOL) tablet 650 mg, 650 mg, Oral, Q4H PRN **OR** [DISCONTINUED] acetaminophen (TYLENOL) solution 650 mg, 650 mg, Per Tube, Q4H PRN **OR** acetaminophen (TYLENOL) suppository 650 mg, 650 mg, Rectal, Q4H PRN, [name], MD .  acetaminophen (TYLENOL) tablet 325-650 mg, 325-650 mg, Oral, Q6H PRN, [name], MD .  apixaban (ELIQUIS) tablet 5 mg, 5 mg, Oral, BID, [name], MD, 5 mg at 03/27/18 1111 .  bisacodyl (DULCOLAX) suppository 10 mg, 10 mg, Rectal, Daily PRN, [name], MD .  cefdinir (OMNICEF) capsule 300 mg, 300 mg, Oral, Q12H, Domenic Polite, MD .  diltiazem (CARDIZEM CD) 24 hr capsule 240 mg, 240 mg, Oral, Daily, [name], MD, 240 mg at 03/27/18 1110 .  docusate sodium (COLACE) capsule 100 mg, 100 mg, Oral, BID, [name], MD, 100 mg at 03/27/18 1111 .  famotidine (PEPCID) tablet 20 mg, 20 mg, Oral, BID, [name], MD, 20 mg at 03/27/18 1111 .  FLUoxetine (PROZAC) capsule 20 mg, 20 mg, Oral, Daily, [name], MD, 20 mg at 03/27/18 1111 .  HYDROcodone-acetaminophen (NORCO/VICODIN) 5-325 MG per tablet 1-2 tablet, 1-2 tablet, Oral, Q4H PRN, [name], MD, 2 tablet at 03/26/18 0100 .  insulin aspart (novoLOG) injection 0-15 Units, 0-15 Units, Subcutaneous, TID WC, [name], MD, 5 Units at 03/27/18 1211 .  insulin aspart (novoLOG) injection 0-5 Units, 0-5 Units, Subcutaneous, QHS, [name], MD, 3 Units at 03/25/18 2219 .  insulin glargine (LANTUS) injection 30 Units, 30 Units, Subcutaneous, QHS, Domenic Polite, MD, 30 Units at 03/26/18 2214 .  magnesium citrate solution 1 Bottle, 1 Bottle, Oral, Once PRN, [name], MD .  metoprolol succinate (TOPROL-XL) 24 hr tablet 150 mg, 150 mg, Oral, Daily, [name], MD, 150 mg at 03/27/18 1110 .  metoprolol succinate (TOPROL-XL) 24 hr tablet 50 mg, 50 mg, Oral, Once, [name], MD .  metoprolol tartrate (LOPRESSOR) injection 5 mg, 5 mg, Intravenous, Q4H PRN, [name], MD, 5 mg at 03/20/18 0053 .  morphine 2 MG/ML injection 0.5-1 mg, 0.5-1 mg, Intravenous, Q2H PRN, [name], MD .  ondansetron [facility]) tablet 4 mg, 4 mg, Oral, Q6H PRN **OR** ondansetron (ZOFRAN) injection 4 mg, 4 mg, Intravenous, Q6H PRN, [name], MD .  polyethylene glycol (MIRALAX / GLYCOLAX) packet 17 g, 17 g, Oral, Daily PRN, [name], MD .  rosuvastatin (CRESTOR) tablet 5 mg, 5 mg, Oral, q1800, [name], MD, 5 mg at 03/26/18 1705 .  senna-docusate (Senokot-S) tablet 1 tablet, 1 tablet, Oral, QHS PRN, [name], MD  Patients Current Diet:  Diet Order           Diet Carb Modified Fluid consistency: Thin; Room service appropriate? Yes  Diet effective now          Precautions / Restrictions Precautions Precautions: Fall Precaution Comments: TDWB LLE Other Brace/Splint: Darco shoe Restrictions Weight Bearing Restrictions: Yes LLE Weight Bearing: Touchdown weight bearing LLE Partial Weight Bearing Percentage or Pounds: heel Other Position/Activity Restrictions: Darco Shoe   Has the patient had 2 or more falls or a fall with injury in the past year?No  Prior  Activity Level Community (5-7x/wk): Independent and active pta; drove; retired Event organiser / Paramedic Devices/Equipment: None Home Equipment: None  Prior Device Use: Indicate devices/aids used by the patient prior to current illness, exacerbation or injury? None of the above  Prior Functional Level Prior Function Level of Independence: Independent Comments: ADls, IADLs, driving, retired  Self Care: Did the patient need help bathing, dressing, using the toilet or eating?  Independent  Indoor Mobility: Did the patient need assistance with walking from room to room (with or without device)? Independent  Stairs: Did the patient need  assistance with internal or external stairs (with or without device)? Independent  Functional Cognition: Did the patient need help planning regular tasks such as shopping or remembering to take medications? Independent  Current Functional Level Cognition  Arousal/Alertness: Awake/alert Overall Cognitive Status: Impaired/Different from baseline Orientation Level: Oriented X4 Following Commands: Follows one step commands with increased time Safety/Judgement: Decreased awareness of safety, Decreased awareness of deficits General Comments: very impulsive with all mobility increasing his fall risk; verbal cueing throughout session for safety Memory: Appears intact(Pt at baseline) Awareness: Appears intact Problem Solving: Appears intact Behaviors: Other (comment)(anxious) Safety/Judgment: Appears intact    Extremity Assessment (includes Sensation/Coordination)  Upper Extremity Assessment: LUE deficits/detail LUE Deficits / Details: ROM and strength approaching baseline - sore from fall but improving LUE Coordination: decreased gross motor  Lower Extremity Assessment: LLE deficits/detail LLE Deficits / Details: new transmet amputation at foot LLE: Unable to fully assess due to pain LLE Coordination: decreased gross  motor    ADLs  Overall ADL's : Needs assistance/impaired Eating/Feeding: NPO Grooming: Wash/dry hands, Set up, Sitting Grooming Details (indicate cue type and reason): EOB Upper Body Bathing: Minimal assistance, Sitting Lower Body Bathing: Moderate assistance, Sit to/from stand Upper Body Dressing : Minimal assistance, Sitting Lower Body Dressing: Moderate assistance, Sitting/lateral leans Lower Body Dressing Details (indicate cue type and reason): ton don darco shoe Toilet Transfer: Min guard, Cueing for safety, Cueing for sequencing, Stand-pivot, BSC, RW Toilet Transfer Details (indicate cue type and reason): vc for sequencing and for WB status to be maintained.  Toileting- Clothing Manipulation and Hygiene: Maximal assistance, Sit to/from stand Toileting - Clothing Manipulation Details (indicate cue type and reason): Pt required assistance for rear peri care for throughness, dependent on RW for balance Functional mobility during ADLs: Minimal assistance, Min guard, Rolling walker, Cueing for safety General ADL Comments: multimodal cues for WB    Mobility  Overal bed mobility: Needs Assistance Bed Mobility: Supine to Sit, Sit to Supine Supine to sit: Supervision Sit to supine: Supervision General bed mobility comments: for safety    Transfers  Overall transfer level: Needs assistance Equipment used: Rolling walker (2 wheeled) Transfers: Sit to/from Stand, [employer] Transfers Sit to Stand: Min guard Stand pivot transfers: Min assist General transfer comment: Min A for immediate standing balance/steadying; verbal cueing for hand placement and WB status    Ambulation / Gait / Stairs / Wheelchair Mobility  Ambulation/Gait Ambulation/Gait assistance: Herbalist (Feet): 10 Feet(x2) Assistive device: Rolling walker (2 wheeled) Gait Pattern/deviations: Step-to pattern(hop to pattern due to TDWB orders) General Gait Details: verbal cueing for WB status, upright  posture, and sequenicng with AD Gait velocity: decreased Stairs: Yes Stairs assistance: Min assist Stair Management: Two rails, Alternating pattern General stair comments: pt is very impulsive when attempting stair training and not listending to cues for safety or technique to practice simulating home entrance; assist for balance and safety    Posture / Balance Balance Overall balance assessment: Needs assistance Sitting-balance support: No upper extremity supported, Feet supported Sitting balance-Leahy Scale: Good Standing balance support: Bilateral upper extremity supported, During functional activity Standing balance-Leahy Scale: Poor Standing balance comment: dependent on RW for support    Special needs/care consideration BiPAP/CPAP  N/a CPM n/a Continuous Drip IV n/a Dialysis  N/a Life Vest n/a Oxygen n/a Special Bed n/a Trach Size n/a Wound Vac wound VAC placed by [name] Given during surgery  Skin right knee abrasion; ecchymosis to bilateral hips and flank Bowel mgmt: incontinent when he bears down  to void due to prostate issues Bladder mgmt: using urinal Diabetic mgmt  Yes pta Prior to last surgery 6/28, pt was to d/c home with iv antibiotics and PICC. Wife to administer and [place] was to follow at home    Previous [place]: Spouse/significant other  Lives With: Spouse Available Help at Discharge: Family, Friend(s), Available 24 hours/day Type of Home: House Home Layout: One level Home Access: Stairs to enter Entrance Stairs-Rails: Right, Left, Can reach both Entrance Stairs-Number of Steps: 2 Bathroom Shower/Tub: Multimedia programmer: Standard Bathroom Accessibility: Yes How Accessible: Accessible via walker [place]: No  Discharge Living Setting Plans for Discharge Living Setting: Patient's home, Lives with (comment)(wife) Type of Home at Discharge: House Discharge Home Layout: One level Discharge  Home Access: Stairs to enter Entrance Stairs-Rails: Right, Left, Can reach both Entrance Stairs-Number of Steps: 2 Discharge Bathroom Shower/Tub: Walk-in shower Discharge Bathroom Toilet: Standard Discharge Bathroom Accessibility: Yes How Accessible: Accessible via walker Does the patient have any problems obtaining your medications?: No  Social/Family/Support Systems Patient Roles: Spouse Contact Information: [name], wife Anticipated Caregiver: wife Anticipated Ambulance person Information: see above Ability/Limitations of Caregiver: retired; 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 Discharge Plan Discussed with Primary Caregiver: Yes Is Caregiver In Agreement with Plan?: Yes Does Caregiver/Family have Issues with Lodging/Transportation while Pt is in Rehab?: No   Goals/Additional Needs Patient/Family Goal for Rehab: supervision PT, supervision to min assist OT, supervision SLP Expected length of stay: [facility] 6 to 10 days Equipment Needs: wound VAC Additional Information: Patient with decreased safety awareness; minimizes defcits Pt/Family Agrees to Admission and willing to participate: Yes Program Orientation Provided & Reviewed with Pt/Caregiver Including Roles  & Responsibilities: Yes  Decrease burden of Care through IP rehab admission: n/a  Possible need for SNF placement upon discharge: not anticipated  Patient Condition: This patient's medical and functional status has changed since the consult dated 03/21/2018 in which the Rehabilitation Physician documented that the patient was not appropriate for intensive rehabilitative care in an inpatient rehabilitation facility. Due to change in status and issues being addressed, patient's case has been discussed with Dr. Posey Pronto and patient now appropriate for inpatient rehabilitation. Will admit to inpatient rehab today.  Preadmission Screen Completed By:  Cleatrice Burke, 03/27/2018 1:43  PM ______________________________________________________________________   Discussed status with Dr. Posey Pronto on 03/27/2018 at  1343 and received telephone approval for admission today.  Admission Coordinator:  Cleatrice Burke, time [id number] Date 03/27/2018

## 2018-03-27 NOTE — Care Management Note (Signed)
Case Management Note  Patient Details  Name: Kevin [name] MRN: [id number] Date of Birth: [birth year]  Subjective/Objective:                    Action/Plan: Pt discharging to CIR today. CM signing off.   Expected Discharge Date:                  Expected Discharge Plan:  [place]  In-House Referral:     Discharge planning Services  CM Consult  Post Acute Care Choice:  Home Health Choice offered to:  Patient, Spouse  DME Arranged:    DME Agency:     HH Arranged:    [place] Agency:     Status of Service:  Completed, signed off  If discussed at [place] of Stay Meetings, dates discussed:    Additional Comments:  Pollie Friar, RN 03/27/2018, 12:30 PM

## 2018-03-27 NOTE — Discharge Summary (Signed)
Physician Discharge Summary  Kevin [name] BSW:[id number] DOB: 1941-05-26 DOA: 03/16/2018  PCP: [name], MD  Admit date: 03/16/2018 Discharge date: 03/27/2018  Time spent: 45 minutes  Recommendations for Outpatient Follow-up:  1. FU with Dr.Duda in 1 week 2. 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 M with NIDDM, HTN and Ulcerative colitis who presented with left sided hemiparesis.  In the ER, had left sided hemiparesis (LUE>LLE, normal strength on right), subtle left facial droop, resolving expressive aphasia.  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 Fib and hence aspirin was changed over to Eliquis -PT OT eval completed, CIR was recommended -discharge to CIR today  New  onset paroxysmal atrial fibrillation CHA2DS2-VASc Score =6 (age, HTN, DM, Stroke).   -heart rate better controlled on increased metoprolol and Cardizem -continue eliquis  Diabetic foot ulcer abscess/osteomyelitis -status post excisional debridement of skin, subcutaneous tissue, muscle, fascia and bone of left plantar wound by 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 Baxter Flattery 6/28 afternoon, since he is s/p transmetatarsal amputation continued IV ceftriaxone while inpatient and then transition to oral cephalosporins for 1 week of discharge  Diabetes -Hold sulfonylurea, Januvia, metformin, Invokana - continue Lantus, dose increased - hemoglobin A1c is 7.6 - resume oral hypoglycemics slowly  Hypertension -Continue metoprolol  Ulcerative colitis Mild, not clinically active. -resume Mesalamine at discharge  Hyponatremia Resolved  Acute renal failure  Baseline Cr 0.8, peaked here at 2.2, now down to 1.5.   -stable, monitor   Consultants:   Critical care  Neurology  Infectious disease  Cardiology  Procedures:  6/21 -status post excisional debridement of skin, subcutaneous tissue, muscle, fascia and bone of left plantar wound by [name] December   6/28 -PROCEDURE: Transmetatarsal amputation Application of Prevena wound VAC by Dr.Marcus Duda     Echocardiogram Study Conclusions - Left ventricle: The cavity size was normal. Systolic function was normal. The estimated ejection fraction was in the range of 60% to 65%. Wall motion was normal; there were no  regional wall motion abnormalities. There was no evidence of  elevated ventricular filling pressure by Doppler parameters. - Left atrium: The atrium was moderately dilated. Impressions: - No cardiac source of emboli was indentified     Discharge Exam: Vitals:   03/27/18 0823 03/27/18 1202  BP: (!) 127/58 136/80  Pulse: 97 97  Resp: 18 18  Temp: 97.9 F (36.6 C) 98.2 F (36.8 C)  SpO2: 93% 97%    General: AAOx3 Cardiovascular: S1S2/Irregularly irregular Respiratory: CTAB  Discharge Instructions    Allergies as of 03/27/2018      Reactions   Sulfa Antibiotics Rash            Durable Medical Equipment  (From admission, onward)        Start     Ordered   03/22/18 1117  For home use only DME 3 n 1  Once     03/22/18 1117   03/22/18 1117  For home use only DME Walker rolling  Once    Question:  Patient needs a walker to treat with the following condition  Answer:  Stroke-like episode ([place])   03/22/18 1117     Allergies  Allergen Reactions  . Sulfa Antibiotics Rash   Follow-up Information    Consuelo Pandy, PA-C. Go on 04/27/2018.   Specialties:  Cardiology, Radiology Why:  @8 :30am for hospital follow up with Dr. Camillia Herter PA Contact information: [address] [place] [address] [phone]        [name], MD In 1 week.   Specialty:  Orthopedic Surgery Contact information: [place] [address] [phone]            The results of significant diagnostics from this hospitalization (including imaging, microbiology, ancillary and laboratory) are listed below for reference.    Significant Diagnostic Studies: Ct Angio Head W Or Wo Contrast  Result Date: 03/16/2018 CLINICAL DATA:  77 year old male code stroke, left side symptoms. EXAM: CT ANGIOGRAPHY HEAD AND NECK TECHNIQUE: Multidetector CT imaging of the head and neck was performed using the standard protocol during bolus administration of intravenous contrast. Multiplanar CT image reconstructions  and MIPs were obtained to evaluate the vascular anatomy. Carotid stenosis measurements (when applicable) are obtained utilizing NASCET criteria, using the distal internal carotid diameter as the denominator. CONTRAST:  73mL ISOVUE-370 IOPAMIDOL (ISOVUE-370) INJECTION 76% COMPARISON:  Head CT without contrast 2041 hours today. FINDINGS: CTA NECK Skeleton: Advanced cervical spine degeneration, especially left side facet arthropathy. Maxillary sinus mucoperiosteal thickening redemonstrated, greater on the left. Dental inflammatory periapical lucency occasionally noted. Chronic distal right clavicle fracture. No acute osseous abnormality identified. Upper chest: Centrilobular emphysema. No superior mediastinal lymphadenopathy. Other neck: Negative.  No neck mass or lymphadenopathy. Aortic arch: Calcified aortic atherosclerosis. 3 vessel arch configuration. Right carotid system: No brachiocephalic artery or right CCA origin stenosis despite atherosclerosis. Intermittent calcified plaque in the right CCA proximal to the bifurcation without stenosis. Mostly calcified plaque at the right carotid bifurcation and right ICA origin. Less than 50 % stenosis with respect to the distal vessel. Left carotid system: No left CCA origin stenosis despite atherosclerosis. Intermittent calcified plaque in the left CCA without stenosis. Mostly calcified plaque at the left carotid bifurcation involving the left ICA origin and bulb. Maximal stenosis is at the distal bulb level estimated at 50 % with respect to the distal vessel (series 8, image 122). Vertebral arteries: No significant proximal right subclavian artery stenosis despite bulky calcified plaque. No right  vertebral artery origin stenosis. Negative right vertebral artery to the skull base. No proximal left subclavian artery stenosis despite atherosclerosis. Calcified plaque at the left vertebral artery origin resulting in moderate to severe stenosis as seen on series 9, image 129.  Intermittent left V1 calcified plaque. But no other occasional left V2 and V3 calcified plaque significant stenosis to the skull base. CTA HEAD Posterior circulation: Patent distal vertebral arteries without stenosis. The left is mildly dominant. Mild left V4 calcified plaque. Patent right PICA origin. Dominant left AICA is patent. Patent vertebrobasilar junction. Patent basilar artery is diminutive, but without stenosis. SCA and PCA origins are patent. Posterior communicating arteries are diminutive or absent. No PCA stenosis or branch occlusion identified. Anterior circulation: Both ICA siphons are patent. Mild to moderate left siphon calcified plaque with only mild left cavernous segment stenosis. Mild to moderate right siphon calcified plaque. Mild right siphon supraclinoid segment stenosis. Patent carotid termini. Normal MCA and ACA origins. Anterior communicating artery is normal. The left ACA appears somewhat dominant throughout. Left MCA M1 segment, bifurcation, and left MCA branches are within normal limits. Right MCA M1 segment is patent with mild irregularity but no high-grade stenosis. Right MCA bifurcation and right MCA branches are within normal limits. Venous sinuses: Not evaluated due to early contrast phase. Anatomic variants: Mildly dominant left vertebral artery, left ACA. Review of the MIP images confirms the above findings IMPRESSION: 1. Negative for large vessel occlusion. This was preliminarily discussed by telephone with Dr. Kerney Elbe on 03/16/2018 at 2114 hours. 2. Positive for atherosclerosis in the head and neck, and Aortic Atherosclerosis (ICD10-I70.0). 3. 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 No hemodynamically significant carotid or right vertebral stenosis. 4.  Emphysema (ICD10-J43.9). Electronically Signed   By: Genevie Ann M.D.   On: 03/16/2018 21:25   Ct Head Wo Contrast  Result Date: 03/17/2018 CLINICAL DATA:  Septic,  increasing confusion. 24 hours after tPA. History of hypertension, diabetes. EXAM: CT HEAD WITHOUT CONTRAST TECHNIQUE: Contiguous axial images were obtained from the base of the skull through the vertex without intravenous contrast. COMPARISON:  CT HEAD March 16, 2018 FINDINGS: BRAIN: No intraparenchymal hemorrhage, mass effect nor midline shift. The ventricles and sulci are normal for age. Patchy supratentorial white matter hypodensities within normal range for patient's age, though non-specific are most compatible with chronic small vessel ischemic disease. No acute large vascular territory infarcts. No abnormal extra-axial fluid collections. Basal cisterns are patent. VASCULAR: Mild-to-moderate calcific atherosclerosis of the carotid siphons. SKULL: No skull fracture. No significant scalp soft tissue swelling. SINUSES/ORBITS: Chronic bony remodeling LEFT maxillary sinus compatible with chronic sinusitis, no acute component. Mastoid air cells are well aerated.The included ocular globes and orbital contents are non-suspicious. OTHER: None. IMPRESSION: Negative non-contrast CT HEAD for age. Electronically Signed   By: Elon Alas M.D.   On: 03/17/2018 17:51   Ct Angio Neck W Or Wo Contrast  Result Date: 03/16/2018 CLINICAL DATA:  77 year old male code stroke, left side symptoms. EXAM: CT ANGIOGRAPHY HEAD AND NECK TECHNIQUE: Multidetector CT imaging of the head and neck was performed using the standard protocol during bolus administration of intravenous contrast. Multiplanar CT image reconstructions and MIPs were obtained to evaluate the vascular anatomy. Carotid stenosis measurements (when applicable) are obtained utilizing NASCET criteria, using the distal internal carotid diameter as the denominator. CONTRAST:  44mL ISOVUE-370 IOPAMIDOL (ISOVUE-370) INJECTION 76% COMPARISON:  Head CT without contrast 2041 hours today. FINDINGS: CTA NECK Skeleton: Advanced  cervical spine degeneration, especially left side  facet arthropathy. Maxillary sinus mucoperiosteal thickening redemonstrated, greater on the left. Dental inflammatory periapical lucency occasionally noted. Chronic distal right clavicle fracture. No acute osseous abnormality identified. Upper chest: Centrilobular emphysema. No superior mediastinal lymphadenopathy. Other neck: Negative.  No neck mass or lymphadenopathy. Aortic arch: Calcified aortic atherosclerosis. 3 vessel arch configuration. Right carotid system: No brachiocephalic artery or right CCA origin stenosis despite atherosclerosis. Intermittent calcified plaque in the right CCA proximal to the bifurcation without stenosis. Mostly calcified plaque at the right carotid bifurcation and right ICA origin. Less than 50 % stenosis with respect to the distal vessel. Left carotid system: No left CCA origin stenosis despite atherosclerosis. Intermittent calcified plaque in the left CCA without stenosis. Mostly calcified plaque at the left carotid bifurcation involving the left ICA origin and bulb. Maximal stenosis is at the distal bulb level estimated at 50 % with respect to the distal vessel (series 8, image 122). Vertebral arteries: No significant proximal right subclavian artery stenosis despite bulky calcified plaque. No right vertebral artery origin stenosis. Negative right vertebral artery to the skull base. No proximal left subclavian artery stenosis despite atherosclerosis. Calcified plaque at the left vertebral artery origin resulting in moderate to severe stenosis as seen on series 9, image 129. Intermittent left V1 calcified plaque. But no other occasional left V2 and V3 calcified plaque significant stenosis to the skull base. CTA HEAD Posterior circulation: Patent distal vertebral arteries without stenosis. The left is mildly dominant. Mild left V4 calcified plaque. Patent right PICA origin. Dominant left AICA is patent. Patent vertebrobasilar junction. Patent basilar artery is diminutive, but without  stenosis. SCA and PCA origins are patent. Posterior communicating arteries are diminutive or absent. No PCA stenosis or branch occlusion identified. Anterior circulation: Both ICA siphons are patent. Mild to moderate left siphon calcified plaque with only mild left cavernous segment stenosis. Mild to moderate right siphon calcified plaque. Mild right siphon supraclinoid segment stenosis. Patent carotid termini. Normal MCA and ACA origins. Anterior communicating artery is normal. The left ACA appears somewhat dominant throughout. Left MCA M1 segment, bifurcation, and left MCA branches are within normal limits. Right MCA M1 segment is patent with mild irregularity but no high-grade stenosis. Right MCA bifurcation and right MCA branches are within normal limits. Venous sinuses: Not evaluated due to early contrast phase. Anatomic variants: Mildly dominant left vertebral artery, left ACA. Review of the MIP images confirms the above findings IMPRESSION: 1. Negative for large vessel occlusion. This was preliminarily discussed by telephone with Dr. Kerney Elbe on 03/16/2018 at 2114 hours. 2. Positive for atherosclerosis in the head and neck, and Aortic Atherosclerosis (ICD10-I70.0). 3. 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 No hemodynamically significant carotid or right vertebral stenosis. 4.  Emphysema (ICD10-J43.9). Electronically Signed   By: Genevie Ann M.D.   On: 03/16/2018 21:25   Mr Brain Wo Contrast  Result Date: 03/18/2018 CLINICAL DATA:  Expressive aphasia. Left arm and leg weakness. Status post tPA on 03/16/2018. EXAM: MRI HEAD WITHOUT CONTRAST TECHNIQUE: Multiplanar, multiecho pulse sequences of the brain and surrounding structures were obtained without intravenous contrast. COMPARISON:  03/17/2018 FINDINGS: Brain: There is no evidence of acute infarct, intracranial hemorrhage, mass, midline shift, or extra-axial fluid collection. There is mild cerebral  atrophy. Scattered subcortical and deep cerebral white matter T2 hyperintensities are nonspecific but compatible with mild chronic small vessel ischemic disease. 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 Vascular: Major  intracranial vascular flow voids are preserved. Skull and upper cervical spine: Unremarkable bone marrow signal. Sinuses/Orbits: Unremarkable orbits. Chronic left maxillary sinusitis. No significant mastoid fluid. Other: None. IMPRESSION: 1. No acute intracranial abnormality. 2. Mild chronic small vessel ischemic disease. Electronically Signed   By: Logan Bores M.D.   On: 03/18/2018 14:50   Mr Foot Left W Wo Contrast  Result Date: 03/23/2018 CLINICAL DATA:  Plantar foot ulcer at the base of the toes with exposed bone. Evaluate for osteomyelitis. EXAM: MRI OF THE LEFT FOREFOOT WITHOUT AND WITH CONTRAST TECHNIQUE: Multiplanar, multisequence MR imaging of the left forefoot was performed both before and after administration of intravenous contrast. CONTRAST:  68mL MULTIHANCE GADOBENATE DIMEGLUMINE 529 MG/ML IV SOLN COMPARISON:  Left foot x-rays dated March 17, 2018. FINDINGS: Bones/Joint/Cartilage There is marrow edema within the second metatarsal head with cortical irregularity and small erosions, consistent with early osteomyelitis. Additional faint marrow edema within the second proximal phalanx, second middle phalanx, third proximal phalanx, and fourth metatarsal head with preserved T1 marrow signal, likely reactive. Dorsal dislocation of the second proximal phalanx with respect to the second metatarsal. Old healed fracture of the distal fifth metatarsal shaft. Normal alignment. No joint effusion. 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 Mild osteoarthritis of the first MTP joint and second tarsometatarsal joint. Chronic juxtacortical erosion along the medial aspect of the third metatarsal head, likely related to gout. Ligaments The collateral ligaments at the second  MTP joint are completely torn remaining collateral ligaments are intact. Muscles and Tendons Flexor, peroneal and extensor compartment tendons are intact. Small amount of fluid and enhancement surrounding the second flexor tendons at the level of the distal second metatarsal. Severe fatty atrophy of the intrinsic muscles of the forefoot. Soft tissue Soft tissue ulceration at the plantar base of the second metatarsal head with a sinus tract extending to the bone. There is phlegmon and small rim enhancing fluid surrounding the dislocated second MTP joint extending into the first and second intermetatarsal spaces and superior to the second extensor tendon at the base of the proximal phalanx. No soft tissue mass. IMPRESSION: 1. Soft tissue ulceration at the plantar base of the second metatarsal head with sinus tract extending to bone. Dorsal dislocation of the second MTP joint with prominent phlegmon and small abscess surrounding the dislocated joint, extending into the first and second intermetatarsal spaces and superior to the second extensor tendons at the base of the proximal phalanx. 2. 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 3. Small amount of fluid and enhancement surrounding the second flexor tendons at the level of the distal second metatarsal, concerning for infectious tenosynovitis. Electronically Signed   By: Titus Dubin M.D.   On: 03/23/2018 14:35   Dg Chest Port 1 View  Result Date: 03/17/2018 CLINICAL DATA:  Respiratory distress EXAM: PORTABLE CHEST 1 VIEW COMPARISON:  10/20/2014 FINDINGS: Cardiac shadows within normal limits. Aortic calcifications are again seen. The lungs are well aerated bilaterally. No focal infiltrate or sizable effusion is noted. No acute bony abnormality is seen. IMPRESSION: No active disease. Electronically Signed   By: Inez Catalina M.D.   On: 03/17/2018 12:35   Dg Foot 2 Views Left  Result Date: 03/17/2018 CLINICAL DATA:  Cellulitis of foot EXAM: LEFT FOOT  - 2 VIEW COMPARISON:  None. FINDINGS: Changes consistent with prior fifth metatarsal fracture with healing are noted. Degenerative changes are noted at the first metatarsal tarsal articulation. Diffuse tarsal degenerative changes are seen. No acute fracture or dislocation is  noted. No soft tissue abnormality is seen. IMPRESSION: Chronic changes without acute abnormality. Electronically Signed   By: Inez Catalina M.D.   On: 03/17/2018 12:36   Ct Head Code Stroke Wo Contrast  Result Date: 03/16/2018 CLINICAL DATA:  Code stroke. 77 year old male with left side weakness and facial droop last seen normal 1840 hours. EXAM: CT HEAD WITHOUT CONTRAST TECHNIQUE: Contiguous axial images were obtained from the base of the skull through the vertex without intravenous contrast. COMPARISON:  None. FINDINGS: Brain: No midline shift, ventriculomegaly, mass effect, evidence of mass lesion, intracranial hemorrhage or evidence of cortically based acute infarction. No cortical encephalomalacia identified. Bilateral gray-white matter differentiation is within normal limits for age. Vascular: Calcified atherosclerosis at the skull base. No suspicious intracranial vascular hyperdensity. Skull: Negative. Sinuses/Orbits: Maxillary mucoperiosteal thickening greater on the left. Other paranasal sinuses are well pneumatized. Tympanic cavities and mastoids are clear. Other: Visualized orbit soft tissues are within normal limits. Visualized scalp soft tissues are within normal limits. ASPECTS [facility] Stroke Program Early CT Score) - Ganglionic level infarction (caudate, lentiform nuclei, internal capsule, insula, M1-M3 cortex): 7 - Supraganglionic infarction (M4-M6 cortex): 3 Total score (0-10 with 10 being normal): 10 IMPRESSION: 1. Negative for age. No acute cortically based infarct or intracranial hemorrhage is identified. 2. ASPECTS is 10. 3. Chronic maxillary sinusitis. 4. These results were communicated to Dr. Cheral Marker at 8:50 pmon  6/20/2019by text page via the [facility] messaging system. Electronically Signed   By: Genevie Ann M.D.   On: 03/16/2018 20:51   [place] Ekg Site Rite  Result Date: 03/21/2018 If Site Rite image not attached, placement could not be confirmed due to current cardiac rhythm.   Microbiology: Recent Results (from the past 240 hour(s))  Anaerobic culture     Status: None   Collection Time: 03/17/18  4:15 PM  Result Value Ref Range Status   Specimen Description WOUND LEFT FOOT  Final   Special Requests NONE  Final   Culture   Final    MODERATE PREVOTELLA MELANINOGENICA BETA LACTAMASE POSITIVE Performed at [facility], [address] [place], [place] [address]    Report Status 03/24/2018 FINAL  Final  Aerobic/Anaerobic Culture (surgical/deep wound)     Status: None   Collection Time: 03/17/18  8:28 PM  Result Value Ref Range Status   Specimen Description ABSCESS LEFT FOOT  Final   Special Requests PATIENT ON FOLLOWING ZOSYN VANC  Final   Gram Stain   Final    MODERATE WBC PRESENT, PREDOMINANTLY PMN MODERATE GRAM POSITIVE COCCI FEW GRAM NEGATIVE RODS    Culture   Final    MODERATE STREPTOCOCCUS GROUP C MODERATE PREVOTELLA MELANINOGENICA BETA LACTAMASE POSITIVE Performed at [facility], [place] [address], [place], [place] [address]    Report Status 03/22/2018 FINAL  Final  Surgical pcr screen     Status: None   Collection Time: 03/23/18  9:20 PM  Result Value Ref Range Status   MRSA, PCR NEGATIVE NEGATIVE Final   Staphylococcus aureus NEGATIVE NEGATIVE Final    Comment: (NOTE) The Xpert SA Assay (FDA approved for NASAL specimens in patients 35 years of age and older), is one component of a comprehensive surveillance program. It is not intended to diagnose infection nor to guide or monitor treatment. Performed at [facility], [place] [address], [place], [place] [address]      Labs: Basic Metabolic Panel: Recent Labs  Lab 03/22/18 0415 03/23/18 0521 03/24/18 0500  03/25/18 0500 03/26/18  0500  NA 137 135 139 138 135  K 4.0 4.7 3.8 3.9 4.1  CL 105 102 103 103 101  CO2 22 25 27 29 27   GLUCOSE 171* 212* 183* 171* 179*  BUN 17 18 16 12 12   CREATININE 1.36* 1.32* 1.23 1.22 1.27*  CALCIUM 7.9* 8.0* 8.2* 8.2* 7.7*   Liver Function Tests: No results for input(s): AST, ALT, ALKPHOS, BILITOT, PROT, ALBUMIN in the last 168 hours. No results for input(s): LIPASE, AMYLASE in the last 168 hours. No results for input(s): AMMONIA in the last 168 hours. CBC: Recent Labs  Lab 03/22/18 0415 03/23/18 0521 03/24/18 0500 03/25/18 0500 03/26/18 0500  WBC 17.4* 18.3* 14.8* 15.9* 13.7*  HGB 11.1* 11.4* 10.5* 10.5* 10.4*  HCT 35.2* 35.8* 33.2* 33.2* 32.9*  MCV 89.6 90.4 90.0 90.2 90.6  PLT 535* 618* 617* 621* 604*   Cardiac Enzymes: No results for input(s): CKTOTAL, CKMB, CKMBINDEX, TROPONINI in the last 168 hours. BNP: BNP (last 3 results) Recent Labs    03/17/18 1148  BNP 358.5*    ProBNP (last 3 results) No results for input(s): PROBNP in the last 8760 hours.  CBG: Recent Labs  Lab 03/26/18 1122 03/26/18 1559 03/26/18 2151 03/27/18 0636 03/27/18 1105  GLUCAP 217* 163* 173* 172* 245*       Signed:  Domenic Polite MD.  [facility] 03/27/2018, 1:37 PM

## 2018-03-27 NOTE — Progress Notes (Signed)
Jamse Arn, MD      Jamse Arn, MD  Physician  Physical Medicine and Rehabilitation      Consult Note  Signed     Date of Service:  03/21/2018  5:53 AM         Related encounter: ED to Hosp-Admission (Discharged) from 03/16/2018 in [facility]             Signed          Expand All Collapse All            Expand widget buttonCollapse widget button    Show:Clear all   ManualTemplateCopied  Added by:     Angiulli, Lavon Paganini, PA-C  Jamse Arn, MD   Hover for detailscustomization button                                                                                                                                                                                         untitled image              Physical Medicine and Rehabilitation Consult  Reason for Consult: Decreased functional mobility  Referring Physician: Dr. Leonie Man        HPI: Mosi Hannold is a 77 y.o. right-handed male with history of remote tobacco abuse, hypertension and diabetes mellitus.  Per chart review and patient, patient lives with spouse.  Reported to be independent driving prior to admission.  One level home with 2 steps to entry.  Presented 03/17/2018 after being found down with left-sided weakness, facial droop and aphasia.  Also noted foul-smelling purulent left foot ulcer.  CK 2084.  Cranial CT scan reviewed, unremarkable for acute process. CT angiogram of head and neck negative for large vessel occlusion.  Patient did receive TPA.  Echocardiogram with ejection fraction of 65% no wall motion abnormalities.  No cardiac source of emboli.  MRI of the brain negative.  On the later evening of 03/17/2018 patient  became tachypneic, tachycardic with frequent PVCs.  Noted elevated WBC of 27,700.  Chest x-ray negative.  X-rays of cellulitic left foot showed chronic changes without acute abnormality.  Placed on broad-spectrum antibiotics for suspect sepsis related to wound infection.  Cardiology services consulted for frequent PVCs.  Noted elevated troponin  0.11-0.22.  Patient noted to be in A. fib on monitor and placed on Toprol as well as Eliquis later added to regimen.  Patient underwent irrigation debridement of left foot wound 03/17/2018 per Dr. Stann Mainland.  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  Patient is partial weightbearing to left lower extremity.  Physical and occupational therapy evaluations completed with recommendations of physical medicine rehab consult. Discussed with primary physician.      Review of Systems   Constitutional: Negative for chills and fever.   HENT: Negative for hearing loss.    Eyes: Negative for blurred vision and double vision.   Respiratory: Negative for cough and shortness of breath.    Cardiovascular: Negative for chest pain and palpitations.   Gastrointestinal: Positive for constipation. Negative for nausea and vomiting.   Genitourinary: Negative for dysuria, flank pain and hematuria.   Musculoskeletal: Positive for myalgias.   Skin: Negative for rash.   Neurological: Positive for speech change.   All other systems reviewed and are negative.          Past Medical History:    Diagnosis   Date    .   Diabetes mellitus without complication ([place])        .   Hypertension                 Past Surgical History:    Procedure   Laterality   Date    .   I&D EXTREMITY   Left   03/17/2018        Procedure: IRRIGATION AND DEBRIDEMENT FOOT;  Surgeon: [name], MD;  Location: [facility];  Service: Orthopedics;  Laterality: Left;    .   WRIST SURGERY                      Family History    Problem   Relation   Age of Onset    .   Hypertension   Father           Social History:  reports that he has never smoked. He has never used smokeless tobacco. He reports that he drinks alcohol. He reports that he does not use drugs.  Allergies:        Allergies    Allergen   Reactions    .   Sulfa Antibiotics   Rash              Medications Prior to Admission    Medication   Sig   Dispense   Refill    .   aspirin EC 81 MG tablet   Take 81 mg by mouth daily.            .   canagliflozin (INVOKANA) 100 MG TABS tablet   Take 100 mg by mouth daily before breakfast.            .   FLUoxetine (PROZAC) 20 MG capsule   Take 20 mg by mouth daily.            Marland Kitchen   glimepiride (AMARYL) 4 MG tablet   Take 2 mg by mouth daily with breakfast.            .   mesalamine (APRISO) 0.375 g 24 hr capsule   Take 750 mg by mouth 2 (two) times daily.             .   metFORMIN (GLUCOPHAGE) 500 MG tablet   Take 1,000 mg by mouth 2 (two) times daily with a meal.             .   metoprolol succinate (TOPROL-XL) 100 MG 24 hr tablet   Take 100  mg by mouth daily. Take with or immediately following a meal.            .   Multiple Vitamin (MULTIVITAMIN WITH MINERALS) TABS tablet   Take 1 tablet by mouth daily.            .   rosuvastatin (CRESTOR) 10 MG tablet   Take 10 mg by mouth daily.            .   sitaGLIPtin (JANUVIA) 100 MG tablet   Take 100 mg by mouth daily.            .   tamsulosin (FLOMAX) 0.4 MG CAPS capsule   Take 0.4 mg by mouth daily after breakfast.                   Home:  Home Living  Family/patient expects to be discharged to:: Private residence  Living Arrangements: Spouse/significant other  Available Help at Discharge: Family, Friend(s), Available 24 hours/day  Type of Home:  House  Home Access: Stairs to enter  Technical brewer of Steps: 2  Entrance Stairs-Rails: Right, Left, Can reach both  Home Layout: One level  Bathroom Shower/Tub: Tourist information centre manager: Standard  Home Equipment: None   Lives With: Spouse   Functional History:  Prior Function  Level of Independence: Independent  Comments: ADls, IADLs, driving, retired  Functional Status:   Mobility:  Bed Mobility  Overal bed mobility: Needs Assistance  Bed Mobility: Supine to Sit  Supine to sit: Supervision  Sit to supine: Supervision  General bed mobility comments: for safety and lines   Transfers  Overall transfer level: Needs assistance  Equipment used: Rolling walker (2 wheeled)  Transfers: Sit to/from Stand  Sit to Stand: Mod assist, +2 safety/equipment  Stand pivot transfers: Min assist, From elevated surface  General transfer comment: assist for balance and mod cues for heel weight bearing  Ambulation/Gait  Ambulation/Gait assistance: Min assist, +2 safety/equipment  Gait Distance (Feet): 12 Feet  Assistive device: Rolling walker (2 wheeled)  Gait Pattern/deviations: Step-to pattern, Wide base of support, Decreased stance time - left  General Gait Details: cues for keeping L foot ahead and heel weight only, assist for balance, safety, and walker use       ADL:  ADL  Overall ADL's : Needs assistance/impaired  Eating/Feeding: Set up, Sitting  Grooming: Set up, Sitting, Supervision/safety  Upper Body Bathing: Minimal assistance, Sitting  Lower Body Bathing: Moderate assistance, Sit to/from stand  Upper Body Dressing : Minimal assistance, Sitting  Lower Body Dressing: Moderate assistance, Sit to/from stand  Lower Body Dressing Details (indicate cue type and reason): assist to don L sock over ace wrap this session once sitting EOB   Toilet Transfer: Moderate assistance, RW, Ambulation  Toilet Transfer Details (indicate cue  type and reason): Pt performing simulated toilet transfer with room level mobility. Required Mod A for safety and balance. VCs for RW management and wbing  Functional mobility during ADLs: Moderate assistance, Rolling walker  General ADL Comments: pt performing bed mobility and static sitting EOB with overall supervision for sitting balance; pt with fluctating and elevated HR sitting EOB noted up to low 150s. Due to increased HR returned to supine in bed, pt participating in A/AAROM to L shoulder once back in supine. Heat applied to shoulder as well      Cognition:  Cognition  Overall Cognitive Status: Impaired/Different from baseline  Arousal/Alertness: Awake/alert  Orientation Level: Oriented X4  Memory: Appears  intact(Pt at baseline)  Awareness: Appears intact  Problem Solving: Appears intact  Behaviors: Other (comment)(anxious)  Safety/Judgment: Appears intact  Cognition  Arousal/Alertness: Awake/alert  Behavior During Therapy: WFL for tasks assessed/performed  Overall Cognitive Status: Impaired/Different from baseline  Area of Impairment: Following commands, Safety/judgement, Problem solving, Awareness  Orientation Level: Disoriented to, Time, Situation  Memory: Decreased short-term memory  Following Commands: Follows multi-step commands with increased time  Safety/Judgement: Decreased awareness of safety, Decreased awareness of deficits  Awareness: Emergent  Problem Solving: Slow processing, Difficulty sequencing  General Comments: Pt appears to have improved cognition this session, though not able to assess during functional tasks beyond sitting EOB this session. Pt intermittently requiring mulitmodal cues for following one step commands      Blood pressure 123/79, pulse 62, temperature (!) 97.4 F (36.3 C), temperature source Oral, resp. rate 18, height 6\' 1"  (1.854 m), weight 97.8 kg (215 lb 9.8 oz), SpO2 94 %.  Physical Exam   Vitals  reviewed.  Constitutional: He is oriented to person, place, and time. He appears well-developed and well-nourished.   HENT:   Head: Normocephalic and atraumatic.   Eyes: EOM are normal. Right eye exhibits no discharge. Left eye exhibits no discharge.   Neck: Normal range of motion. Neck supple. No thyromegaly present.   Cardiovascular:  Irregular irregular   Respiratory: Effort normal and breath sounds normal. No respiratory distress.   GI: Soft. Bowel sounds are normal. He exhibits no distension.  Musculoskeletal:  Edema and tenderness left foot  Neurological: He is alert and oriented to person, place, and time.  Motor: RUE/RLE: 4+-5/5 proximal to distal LUE: Shoulder abduction, elbow flexion 4+/5, elbow extension 3/5, wrist extension 2/5, hand intrinsics 2+/5. ?+Ataxia. (?baseline) LLE: 4+/5 proximal to distal Sensation intact to light touch Left facial weakness   Skin:  Left foot dressing in place  Psychiatric:  ?Confusion         Lab Results Last 24 Hours                                                                                                                                                                                                                                                                    Imaging Results (Last 48 hours)  Assessment/Plan:  Diagnosis: Debility  Labs and images independently reviewed.  Records reviewed and summated above.     1.Does the need for close, 24 hr/day medical supervision in concert with the patient's rehab needs make it unreasonable for this patient to be served in a less intensive setting? Potentially    2.Co-Morbidities requiring supervision/potential complications: remote tobacco abuse, HTN (monitor and provide prns in  accordance with increased physical exertion and pain), DM (Monitor in accordance with exercise and adjust meds as necessary), A. fib (cont meds, monitor HR with increased mobility), hypokalemia (continue to monitor and replete as necessary), leukocytosis (cont to monitor for signs and symptoms of infection, further workup if indicated), ABLA (transfuse if necessary to ensure appropriate perfusion for increased activity tolerance), CKD (avoid nephrotoxic meds), Osteo (cont IV abx per ID)   3.Due to safety, skin/wound care, disease management, medication administration and patient education, does the patient require 24 hr/day rehab nursing? Potentially   4.Does the patient require coordinated care of a physician, rehab nurse, PT (1-2 hrs/day, 5 days/week) and OT (1-2 hrs/day, 5 days/week) to address physical and functional deficits in the context of the above medical diagnosis(es)? Potentially Addressing deficits in the following areas: balance, endurance, locomotion, transferring, bathing, dressing and psychosocial support   5.Can the patient actively participate in an intensive therapy program of at least 3 hrs of therapy per day at least 5 days per week? Yes   6.The potential for patient to make measurable gains while on inpatient rehab is excellent   7.Anticipated functional outcomes upon discharge from inpatient rehab are supervision and min assist  with PT, supervision with OT, n/a with SLP.   8.Estimated rehab length of stay to reach the above functional goals is: 6-10 days.   9.Anticipated D/C setting: Home   10.Anticipated post D/C treatments: HH therapy and Home excercise program   11.Overall Rehab/Functional Prognosis: excellent      RECOMMENDATIONS:  This patient's condition is appropriate for continued rehabilitative care in the following setting: Will need to discuss with wife baseline level of functioning, however, without a 2 therapy need, pt will not quality for IRF.   Further, pt refusing CIR at present, stating he is at baseline.   Patient has agreed to participate in recommended program. Potentially  Note that insurance prior authorization may be required for reimbursement for recommended care.     Comment: Rehab Admissions Coordinator to follow up.        I have personally performed a face to face diagnostic evaluation, including, but not limited to relevant history and physical exam findings, of this patient and developed relevant assessment and plan.  Additionally, I have reviewed and concur with the physician assistant's documentation above.      Delice Lesch, MD, ABPMR  Lavon Paganini Angiulli, PA-C  03/21/2018                Revision History                                        Routing History

## 2018-03-27 NOTE — Progress Notes (Signed)
Physical Therapy Treatment Patient Details Name: Kevin [name] MRN: [id number] DOB: November 09, 1940 Today's Date: 03/27/2018    History of Present Illness  [name] is a 77 y.o.-year-old male with a left diabetic foot ulcer with abscess.  He was admitted to the neuro ICU following  stroke like symptoms 03/16/18.  He was noted to have a left plantar foot ulcer.  During the course of the day 6/21 he was decompensating and becoming septic.  The wound was foul-smelling and concern was that the source of his sepsis was the infected foot.  Underwent I&D left foot ulcer 03/17/18.   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 Stroke symptoms seem to have resolved.  6/27 Left foot transmet amputation.    PT Comments    [name] doing well this AM and motivated to work with PT. Asking many questions regarding length of hospital stay and healing times - answered as able. Patient understanding of WB status and need to comply. Crutches in room with patient wishing to attempt this today - attempted at bedside with little success - education on balance and safety with RW being most appropriate AD currently. Able to Ambulate short distance in room from one side of bed to the other and back with seated rest break (~10 feet) with RW and hop to pattern - maintenance of WB status ~75% with consistent verbal cueing throughout session. Discussion on recommendations for CIR with patient verbalizing he wishes to go so that he can return home. PT recommendations remain appropriate.     Follow Up Recommendations  CIR;Supervision/Assistance - 24 hour     Equipment Recommendations  Rolling walker with 5" wheels;3in1 (PT)    Recommendations for Other Services       Precautions / Restrictions Precautions Precautions: Fall Precaution Comments: TDWB LLE Required Braces or Orthoses: Other Brace/Splint Other Brace/Splint: Darco shoe Restrictions Weight Bearing Restrictions: Yes LLE Weight Bearing: Touchdown  weight bearing LLE Partial Weight Bearing Percentage or Pounds: heel Other Position/Activity Restrictions: Darco Shoe    Mobility  Bed Mobility Overal bed mobility: Needs Assistance Bed Mobility: Supine to Sit;Sit to Supine     Supine to sit: Supervision Sit to supine: Supervision   General bed mobility comments: for safety  Transfers Overall transfer level: Needs assistance Equipment used: Rolling walker (2 wheeled) Transfers: Sit to/from [facility] Sit to Stand: Min guard Stand pivot transfers: Min assist       General transfer comment: Min A for immediate standing balance/steadying; verbal cueing for hand placement and WB status  Ambulation/Gait Ambulation/Gait assistance: Min assist Gait Distance (Feet): 10 Feet(x2) Assistive device: Rolling walker (2 wheeled) Gait Pattern/deviations: Step-to pattern(hop to pattern due to TDWB orders)     General Gait Details: verbal cueing for WB status, upright posture, and sequenicng with AD   Stairs             Wheelchair Mobility    Modified Rankin (Stroke Patients Only)       Balance Overall balance assessment: Needs assistance Sitting-balance support: No upper extremity supported;Feet supported Sitting balance-Leahy Scale: Good     Standing balance support: Bilateral upper extremity supported;During functional activity Standing balance-Leahy Scale: Poor Standing balance comment: dependent on RW for support                            Cognition Arousal/Alertness: Awake/alert Behavior During Therapy: Impulsive Overall Cognitive Status: Impaired/Different from baseline Area of Impairment: Safety/judgement;Following commands;Problem  solving                       Following Commands: Follows one step commands with increased time Safety/Judgement: Decreased awareness of safety;Decreased awareness of deficits   Problem Solving: Difficulty sequencing;Requires verbal  cues;Requires tactile cues General Comments: very impulsive with all mobility increasing his fall risk; verbal cueing throughout session for safety      Exercises      General Comments        Pertinent Vitals/Pain Pain Assessment: No/denies pain    Home Living                      Prior Function            PT Goals (current goals can now be found in the care plan section) Acute Rehab PT Goals Patient Stated Goal: get to rehab and get independent PT Goal Formulation: With patient Time For Goal Achievement: 04/01/18 Potential to Achieve Goals: Good Progress towards PT goals: Progressing toward goals    Frequency    Min 3X/week      PT Plan Current plan remains appropriate    Co-evaluation              AM-PAC PT "6 Clicks" Daily Activity  Outcome Measure  Difficulty turning over in bed (including adjusting bedclothes, sheets and blankets)?: None Difficulty moving from lying on back to sitting on the side of the bed? : A Little Difficulty sitting down on and standing up from a chair with arms (e.g., wheelchair, bedside commode, etc,.)?: Unable Help needed moving to and from a bed to chair (including a wheelchair)?: A Little Help needed walking in hospital room?: A Lot Help needed climbing 3-5 steps with a railing? : Total 6 Click Score: 14    End of Session Equipment Utilized During Treatment: Gait belt Activity Tolerance: Patient limited by fatigue Patient left: in bed;with call bell/phone within reach;with bed alarm set Nurse Communication: Mobility status PT Visit Diagnosis: Other abnormalities of gait and mobility (R26.89);Unsteadiness on feet (R26.81);Pain Pain - Right/Left: Left Pain - part of body: Ankle and joints of foot     Time: 4628-6381 PT Time Calculation (min) (ACUTE ONLY): 39 min  Charges:  $Gait Training: 23-37 mins $Therapeutic Activity: 8-22 mins                    G Codes:       Lanney Gins, PT, DPT 03/27/18  9:52 AM

## 2018-03-28 ENCOUNTER — Inpatient Hospital Stay (HOSPITAL_COMMUNITY): Payer: Medicare Other | Admitting: Occupational Therapy

## 2018-03-28 ENCOUNTER — Inpatient Hospital Stay (HOSPITAL_COMMUNITY): Payer: Medicare Other | Admitting: Speech Pathology

## 2018-03-28 ENCOUNTER — Inpatient Hospital Stay (HOSPITAL_COMMUNITY): Payer: Medicare Other | Admitting: Physical Therapy

## 2018-03-28 DIAGNOSIS — R338 Other retention of urine: Secondary | ICD-10-CM

## 2018-03-28 DIAGNOSIS — N183 Chronic kidney disease, stage 3 (moderate): Secondary | ICD-10-CM

## 2018-03-28 DIAGNOSIS — D72829 Elevated white blood cell count, unspecified: Secondary | ICD-10-CM

## 2018-03-28 DIAGNOSIS — I4891 Unspecified atrial fibrillation: Secondary | ICD-10-CM

## 2018-03-28 DIAGNOSIS — E46 Unspecified protein-calorie malnutrition: Secondary | ICD-10-CM

## 2018-03-28 DIAGNOSIS — Z89432 Acquired absence of left foot: Secondary | ICD-10-CM

## 2018-03-28 DIAGNOSIS — E8809 Other disorders of plasma-protein metabolism, not elsewhere classified: Secondary | ICD-10-CM

## 2018-03-28 DIAGNOSIS — E11621 Type 2 diabetes mellitus with foot ulcer: Secondary | ICD-10-CM

## 2018-03-28 DIAGNOSIS — D62 Acute posthemorrhagic anemia: Secondary | ICD-10-CM

## 2018-03-28 DIAGNOSIS — N401 Enlarged prostate with lower urinary tract symptoms: Secondary | ICD-10-CM

## 2018-03-28 DIAGNOSIS — L97429 Non-pressure chronic ulcer of left heel and midfoot with unspecified severity: Secondary | ICD-10-CM

## 2018-03-28 DIAGNOSIS — E119 Type 2 diabetes mellitus without complications: Secondary | ICD-10-CM

## 2018-03-28 LAB — CBC WITH DIFFERENTIAL/PLATELET
Abs Immature Granulocytes: 0.2 10*3/uL — ABNORMAL HIGH (ref 0.0–0.1)
Basophils Absolute: 0.1 10*3/uL (ref 0.0–0.1)
Basophils Relative: 1 %
Eosinophils Absolute: 0.3 10*3/uL (ref 0.0–0.7)
Eosinophils Relative: 2 %
HCT: 35.5 % — ABNORMAL LOW (ref 39.0–52.0)
Hemoglobin: 11.1 g/dL — ABNORMAL LOW (ref 13.0–17.0)
Immature Granulocytes: 1 %
Lymphocytes Relative: 17 %
Lymphs Abs: 2.8 10*3/uL (ref 0.7–4.0)
MCH: 28.5 pg (ref 26.0–34.0)
MCHC: 31.3 g/dL (ref 30.0–36.0)
MCV: 91 fL (ref 78.0–100.0)
Monocytes Absolute: 1.1 10*3/uL — ABNORMAL HIGH (ref 0.1–1.0)
Monocytes Relative: 7 %
Neutro Abs: 11.5 10*3/uL — ABNORMAL HIGH (ref 1.7–7.7)
Neutrophils Relative %: 72 %
Platelets: 631 10*3/uL — ABNORMAL HIGH (ref 150–400)
RBC: 3.9 MIL/uL — ABNORMAL LOW (ref 4.22–5.81)
RDW: 13.5 % (ref 11.5–15.5)
WBC: 15.9 10*3/uL — ABNORMAL HIGH (ref 4.0–10.5)

## 2018-03-28 LAB — COMPREHENSIVE METABOLIC PANEL
ALT: 18 U/L (ref 0–44)
AST: 16 U/L (ref 15–41)
Albumin: 2.3 g/dL — ABNORMAL LOW (ref 3.5–5.0)
Alkaline Phosphatase: 86 U/L (ref 38–126)
Anion gap: 8 (ref 5–15)
BUN: 9 mg/dL (ref 8–23)
CO2: 30 mmol/L (ref 22–32)
Calcium: 8.7 mg/dL — ABNORMAL LOW (ref 8.9–10.3)
Chloride: 100 mmol/L (ref 98–111)
Creatinine, Ser: 1.24 mg/dL (ref 0.61–1.24)
GFR calc Af Amer: >60 mL/min (ref >60)
GFR calc non Af Amer: 56 mL/min — ABNORMAL LOW (ref >60)
Glucose, Bld: 147 mg/dL — ABNORMAL HIGH (ref 70–99)
Potassium: 4.5 mmol/L (ref 3.5–5.1)
Sodium: 138 mmol/L (ref 135–145)
Total Bilirubin: 0.7 mg/dL (ref 0.3–1.2)
Total Protein: 6.4 g/dL — ABNORMAL LOW (ref 6.5–8.1)

## 2018-03-28 LAB — GLUCOSE, CAPILLARY
Glucose-Capillary: 146 mg/dL — ABNORMAL HIGH (ref 70–99)
Glucose-Capillary: 176 mg/dL — ABNORMAL HIGH (ref 70–99)
Glucose-Capillary: 74 mg/dL (ref 70–99)
Glucose-Capillary: 161 mg/dL — ABNORMAL HIGH (ref 70–99)

## 2018-03-28 MED ORDER — SODIUM CHLORIDE 0.9% FLUSH
10.0000 mL | INTRAVENOUS | Status: DC | PRN
  Administered 2018-03-28 – 2018-03-31 (×2): 10 mL
  Filled 2018-03-28: qty 40

## 2018-03-28 MED ORDER — PRO-STAT SUGAR FREE PO LIQD
30.0000 mL | Freq: Two times a day (BID) | ORAL | Status: DC
  Administered 2018-03-28 – 2018-04-01 (×8): 30 mL via ORAL
  Filled 2018-03-28 (×8): qty 30

## 2018-03-28 NOTE — Progress Notes (Signed)
Physical Therapy Session Note  Patient Details  Name: Kevin [name] MRN: [id number] Date of Birth: 03-Dec-1940  Today's Date: 03/28/2018 PT Individual Time: 1550-1600 PT Individual Time Calculation (min): 10 min (makeup time)  Short Term Goals: Week 1:  PT Short Term Goal 1 (Week 1): =LTG due to estimated LOS  Skilled Therapeutic Interventions/Progress Updates:  No c/o pain.  PT provided pt with handout for general strengthening HEP.  Reviewed ankle pumps, quad/glute sets, short arc quad, heel slides, and hip abd/add to midline verbally and with demonstration. Educated pt on performing exercises 8-10 reps 1-2x/day outside of therapy.  Pt verbalized understanding of all.  Remains in bed with call bell in reach and needs met.   Therapy Documentation Precautions:  Precautions Precautions: Fall Precaution Comments: TDWB LLE heel, NWB in sole of foot Restrictions Weight Bearing Restrictions: Yes LLE Weight Bearing: Non weight bearing   See Function Navigator for Current Functional Status.   Therapy/Group: Individual Therapy  Michel Santee 03/28/2018, 4:17 PM

## 2018-03-28 NOTE — Progress Notes (Signed)
Social Work  Social Work Assessment and Plan  Patient Details  Name: Kevin [name] MRN: [id number] Date of Birth: 31-Jan-1941  Today's Date: 03/28/2018  Problem List:  Patient Active Problem List   Diagnosis Date Noted  . Status post transmetatarsal amputation of left foot ([place])   . Postoperative pain   . Diabetic ulcer of toe of left foot associated with type 2 diabetes mellitus ([place])   . Benign prostatic hyperplasia with urinary retention   . New onset atrial fibrillation ([place])   . Incontinence of feces   . Cerebrovascular accident (CVA) ([place])   . Benign essential HTN   . Diabetes mellitus type 2 in nonobese (HCC)   . Hypokalemia   . PAF (paroxysmal atrial fibrillation) ([place])   . Leukocytosis   . SIRS (systemic inflammatory response syndrome) (HCC)   . Acute blood loss anemia   . Stage 3 chronic kidney disease ([place])   . Essential hypertension 03/20/2018  . Tachycardia 03/20/2018  . Sepsis ([place]) 03/20/2018  . Diabetes ([place]) 03/20/2018  . Subacute osteomyelitis, left ankle and foot ([place]) 03/20/2018  . A-fib ([place]) 03/20/2018  . Stroke-like episode ([place]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 03/16/2018  . Strain of left tibialis anterior muscle 12/19/2015  . 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 Fracture of radius, distal, left, closed 12/19/2012   Past Medical History:  Past Medical History:  Diagnosis Date  . Diabetes mellitus without complication ([place])   . Hypertension    Past Surgical History:  Past Surgical History:  Procedure Laterality Date  . AMPUTATION Left 03/24/2018   Procedure: Transmetatarsal Amputation Left Foot;  Surgeon: [name], MD;  Location: [facility];  Service: Orthopedics;  Laterality: Left;  . I&D EXTREMITY Left 03/17/2018   Procedure: IRRIGATION AND DEBRIDEMENT FOOT;  Surgeon: [name], MD;  Location: [facility];  Service: Orthopedics;  Laterality: Left;  . WRIST SURGERY     Social History:  reports that he has never smoked. He has never used  smokeless tobacco. He reports that he drinks alcohol. He reports that he does not use drugs.  Family / Support Systems Marital Status: Married Patient Roles: Spouse, Other (Comment)(church member) Spouse/Significant Other: [name] [phone]  [phone] Other Supports: Friends and church members Anticipated Caregiver: Wife Ability/Limitations of Caregiver: retired-in good health Caregiver Availability: 24/7 Family Dynamics: Close knit with friends and church members. Was doing well going to the gym weekly and losing weight and feeling good unitl this. Both feel they have good supports who will visit and check in on them  Social History Preferred language: English Religion:  Cultural Background: No issues Education: Secretary/administrator educated Read: Yes Write: Yes Employment Status: Retired Freight forwarder Issues: No issues Guardian/Conservator: none-according to MD pt is capable of making his own decisions while here.  Wife is involved and here daily to provide support to pt   Abuse/Neglect Abuse/Neglect Assessment Can Be Completed: Yes Physical Abuse: Denies Verbal Abuse: Denies Sexual Abuse: Denies Exploitation of patient/patient's resources: Denies Self-Neglect: Denies  Emotional Status Pt's affect, behavior adn adjustment status: Pt is motivated to get home, wants to be medically stable and not sure what he needs or when he will be ready medically. He is willing to work and continue to progress and be less care for his wife. He has never been in a hospital and always been healthy. Recent Psychosocial Issues: PCP managed health issues-was working out and eating well prior to this happening Pyschiatric History: No history deferred depression screen due  to able to express his feelings, but do feel he would benefit in seeing neuro-psych while here due to suddeness of CVA and amputation in less than two weeks. Will ask team and make referral Substance Abuse History: No  issues  Patient / Family Perceptions, Expectations & Goals Pt/Family understanding of illness & functional limitations: Pt and wife can explain his health issues since coming in for a stroke first then his foot was septic. He is recovering from his stroke and is pleased about this. He does feel he could get more information from his MD regarding his condition. Will ask PA to talk with pt and answer his medical questions. Premorbid pt/family roles/activities: Husband, retiree, gym member, church member, etc Anticipated changes in roles/activities/participation: resume Pt/family expectations/goals: Pt states: " I hope to be home soon but want to be doing well."  Wife states: " I hope he is moving better and the vac is done and off by the time he goes home."  [employer]: None Premorbid Home Care/DME Agencies: Other (Comment)(referral to [facility] on acute for follow up) Transportation available at discharge: Wife Resource referrals recommended: Neuropsychology, Support group (specify)  Discharge Planning Living Arrangements: Spouse/significant other Support Systems: Spouse/significant other, Friends/neighbors, Social worker community Type of Residence: Private residence Insurance Resources: [employer], Multimedia programmer (specify)([employer]) Financial Resources: Social Security, Family Support Financial Screen Referred: No Living Expenses: Own Money Management: Patient, Spouse Does the patient have any problems obtaining your medications?: No Home Management: Both of them-he usually did outside work, wife did the inside work Patient/Family Preliminary Plans: Return home with wife who can provide assist if needed. Both are hopeful pt will not have the wound vac but will probably have the IV antibiotics still. Will await therapy team evaluations and work on a safe plan for him. Sw Barriers to Discharge: Medical stability Sw Barriers to Discharge Comments: May be short  length of stay Social Work Anticipated Follow Up Needs: HH/OP, Support Group  Clinical Impression Pleasant gentleman who is motivated to do well and recover from his medical issues. He does want to go home soon but also wants to be ready. His wife is very involved and will be his caregiver at home. Will await team evaluations and work on discharge needs.  Elease Hashimoto 03/28/2018, 11:07 AM

## 2018-03-28 NOTE — Progress Notes (Signed)
Occupational Therapy Session Note  Patient Details  Name: Kevin [name] MRN: [id number] Date of Birth: August 18, 1941  Today's Date: 03/28/2018 OT Individual Time: 1430-1500 OT Individual Time Calculation (min): 30 min    Short Term Goals: Week 1:  OT Short Term Goal 1 (Week 1): STG=LTG due to LOS  Skilled Therapeutic Interventions/Progress Updates:  Pt seen for OT session focusing on education and d/c planning. Pt in supine upon arrival, agreeable to tx session. Voiced increased fatigue from previous tx sessions and desiring to go outside this session.  Completed stand pivot transfer to w/c with min A, no AD. Cues required for technique, need to cont to address in order to increase safety with transfer. Pt taken outside total A in w/c for time and energy conservation. While outside, discussed d/c planning, therapy goals, and continuum of care. Opted not to practice outside ambulation due to no protection of foot on L LE at this time. Will need to address prior to d/c as pt will need to walk on sidewalk in order to enter house. Pt returned to room at end of session, left seated in w/c with all needs in reach. Pt very appreciative of opportunity to go outside.   Therapy Documentation Precautions:  Precautions Precautions: Fall Precaution Comments: TDWB LLE heel, NWB in sole of foot Restrictions Weight Bearing Restrictions: Yes LLE Weight Bearing: Non weight bearing Pain:   No/denies pain  See Function Navigator for Current Functional Status.   Therapy/Group: Individual Therapy  Rounds, Amy L 03/28/2018, 3:12 PM

## 2018-03-28 NOTE — Care Management Note (Signed)
[facility] Individual Statement of Services  Patient Name:  Kevin [name]  Date:  03/28/2018  Welcome to the [facility].  Our goal is to provide you with an individualized program based on your diagnosis and situation, designed to meet your specific needs.  With this comprehensive rehabilitation program, you will be expected to participate in at least 3 hours of rehabilitation therapies Monday-Friday, with modified therapy programming on the weekends.  Your rehabilitation program will include the following services:  Physical Therapy (PT), Occupational Therapy (OT), Speech Therapy (ST), 24 hour per day rehabilitation nursing, Neuropsychology, Case Management (Social Worker), Rehabilitation Medicine, Nutrition Services and Pharmacy Services  Weekly team conferences will be held on Wednesday to discuss your progress.  Your Social Worker will talk with you frequently to get your input and to update you on team discussions.  Team conferences with you and your family in attendance may also be held.  Expected length of stay: 7-10 days  Overall anticipated outcome: supervision-independent level  Depending on your progress and recovery, your program may change. Your Social Worker will coordinate services and will keep you informed of any changes. Your Social Worker's name and contact numbers are listed  below.  The following services may also be recommended but are not provided by the [facility] will be made to provide these services after discharge if needed.  Arrangements include referral to agencies that provide these services.  Your insurance has been verified to be:  [place] Your primary doctor is:  [name]  Pertinent information will be shared with your doctor and your insurance  company.  Social Worker:  Ovidio Kin, [place] or [phone]  Information discussed with and copy given to patient by: Elease Hashimoto, 03/28/2018, 10:28 AM

## 2018-03-28 NOTE — Evaluation (Signed)
Speech Language Pathology Assessment and Plan  Patient Details  Name: Kevin [name] MRN: [id number] Date of Birth: 1940-10-23   Today's Date: 03/28/2018 SLP Individual Time: 0810-0850 SLP Individual Time Calculation (min): 40 min   Problem List:  Patient Active Problem List   Diagnosis Date Noted  . Status post transmetatarsal amputation of left foot ([place])   . Postoperative pain   . Diabetic ulcer of toe of left foot associated with type 2 diabetes mellitus ([place])   . Benign prostatic hyperplasia with urinary retention   . New onset atrial fibrillation ([place])   . Incontinence of feces   . Cerebrovascular accident (CVA) ([place])   . Benign essential HTN   . Diabetes mellitus type 2 in nonobese (HCC)   . Hypokalemia   . PAF (paroxysmal atrial fibrillation) ([place])   . Leukocytosis   . SIRS (systemic inflammatory response syndrome) (HCC)   . Acute blood loss anemia   . Stage 3 chronic kidney disease ([place])   . Essential hypertension 03/20/2018  . Tachycardia 03/20/2018  . Sepsis ([place]) 03/20/2018  . Diabetes ([place]) 03/20/2018  . Subacute osteomyelitis, left ankle and foot ([place]) 03/20/2018  . A-fib ([place]) 03/20/2018  . Stroke-like episode ([place]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 03/16/2018  . Strain of left tibialis anterior muscle 12/19/2015  . 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 Fracture of radius, distal, left, closed 12/19/2012   Past Medical History:  Past Medical History:  Diagnosis Date  . Diabetes mellitus without complication ([place])   . Hypertension    Past Surgical History:  Past Surgical History:  Procedure Laterality Date  . AMPUTATION Left 03/24/2018   Procedure: Transmetatarsal Amputation Left Foot;  Surgeon: [name], MD;  Location: [facility];  Service: Orthopedics;  Laterality: Left;  . I&D EXTREMITY Left 03/17/2018   Procedure: IRRIGATION AND DEBRIDEMENT FOOT;  Surgeon: [name], MD;  Location: [facility];  Service: Orthopedics;  Laterality: Left;  . WRIST  SURGERY      Assessment / Plan / Recommendation Clinical Impression   Kevin [name] is a 77 year old male with history of T2DM with neuropathic ulcer, HTN, ulcerative colitis who was admitted on 03/16/18 after found down with left sided weakness, left facial droop and expressive deficits. CT head negative and he received tPA.  Next day he was found to have tachycardia with tachypnea as well as foul smelling drainage from left foot ulcer.  He was started on broad spectrum antibiotics due to sepsis--wife reported issues with confusion prior to admission.  Cardiology consulted due to elevated troponin's and tachycardia. Elevated trops felt to be due to foot infection and 2 D echo done revealing EF 60-65% with no wall abnormality.  MRI brain showed no acute abnormality with mild small vessel disease.  He did developed new onset A fib and was started on apixaban on 06/24.  Plans for cardioversion in the future.  Mesalamine placed on hold at this time.   ID consulted for input and recommended 6 weeks of IV ceftriaxone due to concerns of osteomyelitis and patient underwent I & D left foot ulcer by Dr. Stann Mainland.  He continued to have persistent drainage with cellulitis and developed abscess under 2nd MT head with evidence of osteomyelitis. 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 To be TDWB on heel to NWB LLE for about 2 weeks and VAC to stay in place for a week. ID recommends changing patient to one week of  cephalosporin at discharge due to transmet amputation and surgical cultures with group C strep and coag negative staph.  Therapy ongoing and CIR recommended due to functional deficits.     Pt presents with grossly intact cognitive-linguistic function.  Pt's speech was fluent and free from dysarthria or word finding impairment.  Pt's cognition was [facility] as supported by a score of 30/30 on the Mini Mental State Exam.  Pt endorses that he is back to baseline  for speech, language, and cognition.  As a result, no further ST needs are indicated at this time.       Skilled Therapeutic Interventions          Cognitive-linguistic evaluation completed with results and recommendations reviewed with patient.     SLP Assessment  Patient does not need any further Speech Lanaguage Pathology Services    Recommendations  Follow up Recommendations: None Equipment Recommended: None recommended by SLP           Pain Pain Assessment Pain Scale: 0-10 Pain Score: 0-No pain  Prior Functioning Cognitive/Linguistic Baseline: Within functional limits Type of Home: House  Lives With: Spouse Available Help at Discharge: Family;Friend(s);Available 24 hours/day Vocation: Retired  Function:  Eating Eating                 Cognition Comprehension Comprehension assist level: Follows complex conversation/direction with no assist  Expression   Expression assist level: Expresses complex ideas: With no assist  Social Interaction Social Interaction assist level: Interacts appropriately with others - No medications needed.  Problem Solving Problem solving assist level: Solves complex problems: Recognizes & self-corrects  Memory Memory assist level: Complete Independence: No helper    Refer to Care Plan for Long Term Goals  Recommendations for other services: None   Discharge Criteria: Patient will be discharged from SLP if patient refuses treatment 3 consecutive times without medical reason, if treatment goals not met, if there is a change in medical status, if patient makes no progress towards goals or if patient is discharged from hospital.  The above assessment, treatment plan, treatment alternatives and goals were discussed and mutually agreed upon: by patient  Page, Selinda Orion 03/28/2018, 9:00 AM

## 2018-03-28 NOTE — Evaluation (Addendum)
Occupational Therapy Assessment and Plan  Patient Details  Name: Kevin [name] MRN: [id number] Date of Birth: July 31, 1941  OT Diagnosis: muscle weakness (generalized) Rehab Potential: Rehab Potential (ACUTE ONLY): Excellent ELOS: 4-6 days   Today's Date: 03/28/2018 OT Individual Time: 1100-1200 OT Individual Time Calculation (min): 60 min     Problem List:  Patient Active Problem List   Diagnosis Date Noted  . Status post transmetatarsal amputation of left foot ([place])   . Postoperative pain   . Diabetic ulcer of toe of left foot associated with type 2 diabetes mellitus ([place])   . Benign prostatic hyperplasia with urinary retention   . New onset atrial fibrillation ([place])   . Incontinence of feces   . Cerebrovascular accident (CVA) ([place])   . Benign essential HTN   . Diabetes mellitus type 2 in nonobese (HCC)   . Hypokalemia   . PAF (paroxysmal atrial fibrillation) ([place])   . Leukocytosis   . SIRS (systemic inflammatory response syndrome) (H[facility])   . Acute blood loss anemia   . Stage 3 chronic kidney disease ([place])   . Essential hypertension 03/20/2018  . Tachycardia 03/20/2018  . Sepsis ([place]) 03/20/2018  . Diabetes ([place]) 03/20/2018  . Subacute osteomyelitis, left ankle and foot ([place]) 03/20/2018  . A-fib ([place]) 03/20/2018  . Stroke-like episode ([place]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 03/16/2018  . Strain of left tibialis anterior muscle 12/19/2015  . 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 Fracture of radius, distal, left, closed 12/19/2012    Past Medical History:  Past Medical History:  Diagnosis Date  . Diabetes mellitus without complication ([place])   . Hypertension    Past Surgical History:  Past Surgical History:  Procedure Laterality Date  . AMPUTATION Left 03/24/2018   Procedure: Transmetatarsal Amputation Left Foot;  Surgeon: [name], MD;  Location: [facility];  Service: Orthopedics;  Laterality: Left;  . I&D EXTREMITY Left 03/17/2018   Procedure: IRRIGATION AND DEBRIDEMENT  FOOT;  Surgeon: [name], MD;  Location: [facility];  Service: Orthopedics;  Laterality: Left;  . WRIST SURGERY      Assessment & Plan Clinical Impression: Kevin [name] is a 77 year old male with history of T2DM with neuropathic ulcer, HTN, ulcerative colitis who was admitted on 03/16/18 after found down with left sided weakness, left facial droop and expressive deficits. CT head negative and he received tPA.  Next day he was found to have tachycardia with tachypnea as well as foul smelling drainage from left foot ulcer.  He was started on broad spectrum antibiotics due to sepsis--wife reported issues with confusion prior to admission.  Cardiology consulted due to elevated troponin's and tachycardia. Elevated trops felt to be due to foot infection and 2 D echo done revealing EF 60-65% with no wall abnormality.  MRI brain showed no acute abnormality with mild small vessel disease.  He did developed new onset A fib and was started on apixaban on 06/24.  Plans for cardioversion in the future.  Mesalamine placed on hold at this time.   ID consulted for input and recommended 6 weeks of IV ceftriaxone due to concerns of osteomyelitis and patient underwent I & D left foot ulcer by Dr. Stann Mainland.  He continued to have persistent drainage with cellulitis and developed abscess under 2nd MT head with evidence of osteomyelitis. 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 To be TDWB on heel to NWB LLE for about 2 weeks and VAC to  stay in place for a week. ID recommends changing patient to one week of cephalosporin at discharge due to transmet amputation and surgical cultures with group C strep and coag negative staph.  Therapy ongoing and CIR recommended due to functional deficits.    Patient transferred to CIR on 03/27/2018 .    Patient currently requires min with basic self-care skills secondary to muscle weakness, decreased cardiorespiratoy endurance and  decreased standing balance, decreased postural control, decreased balance strategies and difficulty maintaining precautions.  Prior to hospitalization, patient could complete ADL/IADLs with independent .  Patient will benefit from skilled intervention to decrease level of assist with basic self-care skills, increase independence with basic self-care skills and increase level of independence with iADL prior to discharge home with care partner.  Anticipate patient will require intermittent supervision and follow up home health.  OT - End of Session Activity Tolerance: Tolerates 10 - 20 min activity with multiple rests OT Assessment Rehab Potential (ACUTE ONLY): Excellent OT Patient demonstrates impairments in the following area(s): Balance;Endurance;Safety;Skin Integrity OT Basic ADL's Functional Problem(s): Toileting;Bathing;Dressing OT Transfers Functional Problem(s): Toilet;Tub/Shower OT Additional Impairment(s): None OT Plan OT Intensity: Minimum of 1-2 x/day, 45 to 90 minutes OT Frequency: 5 out of 7 days OT Duration/Estimated Length of Stay: 7-10 days OT Treatment/Interventions: Balance/vestibular training;Community reintegration;Discharge planning;Disease mangement/prevention;DME/adaptive equipment instruction;Functional mobility training;Pain management;Patient/family education;Psychosocial support;Self Care/advanced ADL retraining;Skin care/wound managment;Splinting/orthotics;Therapeutic Activities;Therapeutic Exercise;UE/LE Strength taining/ROM;UE/LE Coordination activities OT Self Feeding Anticipated Outcome(s): Indep. OT Basic Self-Care Anticipated Outcome(s): Mod I OT Toileting Anticipated Outcome(s): Mod I OT Bathroom Transfers Anticipated Outcome(s): Supervision-mod I OT Recommendation Recommendations for Other Services: Therapeutic Recreation consult Therapeutic Recreation Interventions: Kitchen group;Outing/community reintergration Patient destination: Home Follow Up  Recommendations: Home health OT Equipment Recommended: To be determined   Skilled Therapeutic Intervention Pt seen for OT eval and ADL session. Pt in supine upon arrival with RN present, pt agreeable to tx session and denying pain. Completed bathing/dressing from w/c level, required min cuing for orientation of t-shirt, though displayed no other cognitive impairments during session. Transfers completed throughout session with RW and min A. VCs for maintaining WBing pre-cautions.  Grooming tasks completed from w/c level at sink mod I. Pt self propelled w/c throughout unit for UE strengthening/endurance and introduction to unit and rehab process. Ambulated ~48f in hallway with +2 required for wound vac and IV management.  Pt returned to room at end of session, all needs in reach.  Education provided throughout session regarding role of OT, POC, therapy goals, and d/c planning.   OT Evaluation Precautions/Restrictions  Precautions Precautions: Fall Precaution Comments: TDWB LLE heel, NWB in sole of foot Restrictions Weight Bearing Restrictions: Yes LLE Weight Bearing: Non weight bearing General Chart Reviewed: Yes Pain Pain Assessment Pain Scale: 0-10 Pain Score: 0-No pain No/denies pain Home Living/Prior Functioning Home Living Family/patient expects to be discharged to:: Private residence Living Arrangements: Spouse/significant other Available Help at Discharge: Family, Friend(s), Available 24 hours/day Type of Home: House Home Access: Stairs to enter ETechnical brewerof Steps: 2 Entrance Stairs-Rails: Right, Left, Can reach both Home Layout: One level Bathroom Shower/Tub: WMultimedia programmer SProgrammer, systems Yes  Lives With: Spouse IADL History Current License: Yes Occupation: Retired Leisure and Hobbies: Landscaping Prior Function Level of Independence: Independent with basic ADLs, Independent with homemaking with ambulation,  Independent with gait, Independent with transfers  Able to Take Stairs?: Yes Driving: Yes Vocation: Retired Comments: ADls, IADLs, driving, retired VSurveyor, 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 Vision/History: Wears glasses Wears Glasses: At all times Patient  Visual Report: No change from baseline Vision Assessment?: No apparent visual deficits Perception  Perception: Within Functional Limits Praxis Praxis: Intact Cognition Overall Cognitive Status: Within Functional Limits for tasks assessed Arousal/Alertness: Awake/alert Orientation Level: Person;Place;Situation Person: Oriented Place: Oriented Situation: Oriented Year: 2019 Month: July Day of Week: Correct Memory: Appears intact Immediate Memory Recall: Sock;Blue;Bed Memory Recall: Sock;Blue;Bed Memory Recall Sock: Without Cue Memory Recall Blue: Without Cue Memory Recall Bed: Without Cue Awareness: Appears intact Problem Solving: Appears intact Safety/Judgment: Appears intact Sensation Sensation Light Touch: Appears Intact Proprioception: Appears Intact Coordination Gross Motor Movements are Fluid and Coordinated: No Fine Motor Movements are Fluid and Coordinated: Yes Coordination and Movement Description: Impaired due to [employer] pre-cautions/pain Motor  Motor Motor: Within Functional Limits Motor - Skilled Clinical Observations: Impaired due to [facility] pre-cautions Trunk/Postural Assessment  Cervical Assessment Cervical Assessment: Within Functional Limits Thoracic Assessment Thoracic Assessment: Within Functional Limits Lumbar Assessment Lumbar Assessment: Within Functional Limits Postural Control Postural Control: Within Functional Limits  Balance Balance Balance Assessed: Yes Dynamic Sitting Balance Dynamic Sitting - Balance Support: During functional activity;Feet supported;No upper extremity supported Sitting balance - Comments: Sitting EOB Static Standing Balance Static Standing - Balance Support: During functional activity;No  upper extremity supported Static Standing - Level of Assistance: 5: Stand by assistance Static Standing - Comment/# of Minutes: Sitting EOB to dress Dynamic Standing Balance Dynamic Standing - Balance Support: During functional activity;Right upper extremity supported;Left upper extremity supported Dynamic Standing - Level of Assistance: 4: Min assist Dynamic Standing - Comments: Standing at RW during bathing/dressing tasks Extremity/Trunk Assessment RUE Assessment RUE Assessment: Within Functional Limits LUE Assessment LUE Assessment: Within Functional Limits   See Function Navigator for Current Functional Status.   Refer to Care Plan for Long Term Goals  Recommendations for other services: Therapeutic Recreation  Pet therapy, Kitchen group and Outing/community reintegration   Discharge Criteria: Patient will be discharged from OT if patient refuses treatment 3 consecutive times without medical reason, if treatment goals not met, if there is a change in medical status, if patient makes no progress towards goals or if patient is discharged from hospital.  The above assessment, treatment plan, treatment alternatives and goals were discussed and mutually agreed upon: by patient and by family  Rounds, Amy L 03/28/2018, 12:46 PM

## 2018-03-28 NOTE — IPOC Note (Signed)
Overall Plan of Care [facility]) Patient Details Name: Kevin [name] MRN: [id number] DOB: 10/09/1940  Admitting Diagnosis: Left transmetatarsal amputation  Hospital Problems: Active Problems:   Sepsis ([place])   S/P transmetatarsal amputation of foot, left (HCC)   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 ([place])   Diabetic ulcer of left midfoot associated with type 2 diabetes mellitus ([place])     Functional Problem List: Nursing Bladder, Bowel, Edema, Endurance, Medication Management, Motor, Pain, Nutrition, Safety, Skin Integrity  PT Balance, Endurance, Safety, Pain, Behavior  OT Balance, Endurance, Safety, Skin Integrity  SLP    TR         Basic ADL's: OT Toileting, Bathing, Dressing     Advanced  ADL's: OT       Transfers: PT Bed Mobility, Bed to Chair, Car, Manufacturing systems engineer, Metallurgist: PT Ambulation, Emergency planning/management officer, Stairs     Additional Impairments: OT None  SLP None      TR      Anticipated Outcomes Item Anticipated Outcome  Self Feeding Indep.  Swallowing      Basic self-care  Mod I  Toileting  Mod I   Bathroom Transfers Supervision-mod I  Bowel/Bladder  managed with min assist  Transfers  modI  Locomotion  S with LRAD  Communication     Cognition     Pain  less than 3  Safety/Judgment  mod I   Therapy Plan: PT Intensity: Minimum of 1-2 x/day ,45 to 90 minutes PT Frequency: 5 out of 7 days PT Duration Estimated Length of Stay: 5-7 days OT Intensity: Minimum of 1-2 x/day, 45 to 90 minutes OT Frequency: 5 out of 7 days OT Duration/Estimated Length of Stay: 7-10 days      Team Interventions: Nursing Interventions Patient/Family Education, Bladder Management, Disease Management/Prevention, Pain Management, Medication Management, Skin Care/Wound Management  PT interventions Ambulation/gait training, Discharge planning, Functional mobility training, Therapeutic Activities, Wheelchair  propulsion/positioning, Therapeutic Exercise, Skin care/wound management, Neuromuscular re-education, Disease management/prevention, Training and development officer, Cognitive remediation/compensation, DME/adaptive equipment instruction, Pain management, UE/LE Strength taining/ROM, UE/LE Coordination activities, Stair training, Patient/family education, Community reintegration  OT Interventions Training and development officer, Academic librarian, Discharge planning, Disease mangement/prevention, Engineer, drilling, Functional mobility training, Pain management, Patient/family education, Psychosocial support, Self Care/advanced ADL retraining, Skin care/wound managment, Splinting/orthotics, Therapeutic Activities, Therapeutic Exercise, UE/LE Strength taining/ROM, UE/LE Coordination activities  SLP Interventions    TR Interventions    SW/CM Interventions Discharge Planning, Psychosocial Support, Patient/Family Education   Barriers to Discharge MD  Medical stability, IV antibiotics, Incontinence, Wound care and Weight bearing restrictions  Nursing      PT Behavior impulsive  OT      SLP      SW Medical stability May be short length of stay   Team Discharge Planning: Destination: PT-Home ,OT- Home , SLP-  Projected Follow-up: PT-Home health PT, OT-  Home health OT, SLP-None Projected Equipment Needs: PT-(RW), OT- To be determined, SLP-None recommended by SLP Equipment Details: PT- , OT-  Patient/family involved in discharge planning: PT- Patient,  OT-Patient, Family member/caregiver, SLP-Patient  MD ELOS: 5-8 days. Medical Rehab Prognosis:  Good Assessment: 77 year old male with history of T2DM with neuropathic ulcer, HTN, ulcerative colitis who was admitted on 03/16/18 after found down with left sided weakness, left facial droop and expressive deficits. CT head negative and he received tPA.  Next day he was found to have tachycardia with tachypnea as well as foul smelling  drainage from left foot ulcer.  He was started on broad spectrum antibiotics due to sepsis--wife reported issues with confusion prior to admission.  Cardiology consulted due to elevated troponin's and tachycardia. Elevated trops felt to be due to foot infection and 2 D echo done revealing EF 60-65% with no wall abnormality.  MRI brain showed no acute abnormality with mild small vessel disease.  He did developed new onset A fib and was started on apixaban on 06/24.  Plans for cardioversion in the future.  Mesalamine placed on hold at this time. ID consulted for input and recommended 6 weeks of IV ceftriaxone due to concerns of osteomyelitis and patient underwent I & D left foot ulcer by Dr. Stann Mainland.  He continued to have persistent drainage with cellulitis and developed abscess under 2nd MT head with evidence of osteomyelitis. 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 To be TDWB on heel to NWB LLE for about 2 weeks and VAC to stay in place for a week. ID recommends changing patient to one week of cephalosporin at discharge due to transmet amputation and surgical cultures with group C strep and coag negative staph.  Patient with resulting functional deficits with mobility, transfers, self-care.  Will set goals for Mod I most tasks with PT/OT.   See Team Conference Notes for weekly updates to the plan of care

## 2018-03-28 NOTE — Evaluation (Signed)
Physical Therapy Assessment and Plan  Patient Details  Name: Kevin [name] MRN: [id number] Date of Birth: [birth year]  PT Diagnosis: Abnormality of gait, Difficulty walking, Muscle weakness and Pain in L residual Rehab Potential: Good ELOS: 5-7 days   Today's Date: 03/28/2018 PT Individual Time: 1315-1400 PT Individual Time Calculation (min): 45 min    Problem List:  Patient Active Problem List   Diagnosis Date Noted  . Status post transmetatarsal amputation of left foot ([place])   . Postoperative pain   . Diabetic ulcer of toe of left foot associated with type 2 diabetes mellitus ([place])   . Benign prostatic hyperplasia with urinary retention   . New onset atrial fibrillation ([place])   . Incontinence of feces   . Cerebrovascular accident (CVA) ([place])   . Benign essential HTN   . Diabetes mellitus type 2 in nonobese (HCC)   . Hypokalemia   . PAF (paroxysmal atrial fibrillation) ([place])   . Leukocytosis   . SIRS (systemic inflammatory response syndrome) (HCC)   . Acute blood loss anemia   . Stage 3 chronic kidney disease ([place])   . Essential hypertension 03/20/2018  . Tachycardia 03/20/2018  . Sepsis ([place]) 03/20/2018  . Diabetes ([place]) 03/20/2018  . Subacute osteomyelitis, left ankle and foot ([place]) 03/20/2018  . A-fib ([place]) 03/20/2018  . Stroke-like episode ([place]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 03/16/2018  . Strain of left tibialis anterior muscle 12/19/2015  . 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 Fracture of radius, distal, left, closed 12/19/2012    Past Medical History:  Past Medical History:  Diagnosis Date  . Diabetes mellitus without complication ([place])   . Hypertension    Past Surgical History:  Past Surgical History:  Procedure Laterality Date  . AMPUTATION Left 03/24/2018   Procedure: Transmetatarsal Amputation Left Foot;  Surgeon: [name], MD;  Location: [facility];  Service: Orthopedics;  Laterality: Left;  . I&D EXTREMITY Left 03/17/2018   Procedure: IRRIGATION AND  DEBRIDEMENT FOOT;  Surgeon: [name], MD;  Location: [facility];  Service: Orthopedics;  Laterality: Left;  . WRIST SURGERY      Assessment & Plan Clinical Impression: Kevin [name] is a 77 year old male with history of T2DM with neuropathic ulcer, HTN, ulcerative colitis who was admitted on 03/16/18 after found down with left sided weakness, left facial droop and expressive deficits. CT head negative and he received tPA.  Next day he was found to have tachycardia with tachypnea as well as foul smelling drainage from left foot ulcer.  He was started on broad spectrum antibiotics due to sepsis--wife reported issues with confusion prior to admission.  Cardiology consulted due to elevated troponin's and tachycardia. Elevated trops felt to be due to foot infection and 2 D echo done revealing EF 60-65% with no wall abnormality.  MRI brain showed no acute abnormality with mild small vessel disease.  He did developed new onset A fib and was started on apixaban on 06/24.  Plans for cardioversion in the future.  Mesalamine placed on hold at this time.   ID consulted for input and recommended 6 weeks of IV ceftriaxone due to concerns of osteomyelitis and patient underwent I & D left foot ulcer by Dr. Stann Mainland.  He continued to have persistent drainage with cellulitis and developed abscess under 2nd MT head with evidence of osteomyelitis. 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 To be TDWB on heel to NWB LLE for about 2  weeks and VAC to stay in place for a week. ID recommends changing patient to one week of cephalosporin at discharge due to transmet amputation and surgical cultures with group C strep and coag negative staph.  Therapy ongoing and CIR recommended due to functional deficits. Patient transferred to CIR on 03/27/2018 .   Patient currently requires min with mobility secondary to muscle weakness, decreased cardiorespiratoy endurance and  decreased standing balance, decreased postural control, decreased balance strategies and difficulty maintaining precautions.  Prior to hospitalization, patient was independent  with mobility and lived with Spouse in a House home.  Home access is 2Stairs to enter.  Patient will benefit from skilled PT intervention to maximize safe functional mobility, minimize fall risk and decrease caregiver burden for planned discharge home with 24 hour supervision.  Anticipate patient will not need PT follow up at discharge.  PT - End of Session Activity Tolerance: Tolerates 30+ min activity with multiple rests Endurance Deficit: Yes Endurance Deficit Description: fatigues quickly with short duration mobility activities PT Assessment Rehab Potential (ACUTE/IP ONLY): Good PT Barriers to Discharge: Behavior PT Barriers to Discharge Comments: impulsive PT Patient demonstrates impairments in the following area(s): Balance;Endurance;Safety;Pain;Behavior PT Transfers Functional Problem(s): Bed Mobility;Bed to Chair;Car;Furniture PT Locomotion Functional Problem(s): Ambulation;Wheelchair Mobility;Stairs PT Plan PT Intensity: Minimum of 1-2 x/day ,45 to 90 minutes PT Frequency: 5 out of 7 days PT Duration Estimated Length of Stay: 5-7 days PT Treatment/Interventions: Ambulation/gait training;Discharge planning;Functional mobility training;Therapeutic Activities;Wheelchair propulsion/positioning;Therapeutic Exercise;Skin care/wound management;Neuromuscular re-education;Disease management/prevention;Balance/vestibular training;Cognitive remediation/compensation;DME/adaptive equipment instruction;Pain management;UE/LE Strength taining/ROM;UE/LE Coordination activities;Stair training;Patient/family education;Community reintegration PT Transfers Anticipated Outcome(s): modI PT Locomotion Anticipated Outcome(s): S with LRAD PT Recommendation Follow Up Recommendations: Home health PT Patient destination: Home Equipment  Recommended: (RW)  Skilled Therapeutic Intervention Pt received seated in bed with wife present; denies pain and agreeable to treatment. PT evaluation performed and completed as described below with min guard/minA overall, cues to maintain TDWB LLE. Educated pt and wife on rehab process, goals, estimated LOS; pt agreeable. Missed 15 min d/t nursing care. Remained supine in bed at end of session, all needs in reach.   PT Evaluation Precautions/Restrictions Precautions Precautions: Fall Precaution Comments: TDWB LLE heel, NWB in sole of foot Restrictions Weight Bearing Restrictions: Yes LLE Weight Bearing: Non weight bearing General Chart Reviewed: Yes PT Amount of Missed Time (min): 15 Minutes PT Missed Treatment Reason: Nursing care Response to Previous Treatment: Patient reporting fatigue but able to participate. Family/Caregiver Present: Yes  Pain Pain Assessment Pain Scale: 0-10 Pain Score: 0-No pain Home Living/Prior Functioning Home Living Living Arrangements: Spouse/significant other Available Help at Discharge: Family;Friend(s);Available 24 hours/day Type of Home: House Home Access: Stairs to enter [employer] of Steps: 2 Entrance Stairs-Rails: Right;Left;Can reach both Home Layout: One level Bathroom Shower/Tub: Multimedia programmer: Standard Bathroom Accessibility: Yes  Lives With: Spouse Prior Function Level of Independence: Independent with basic ADLs;Independent with homemaking with ambulation;Independent with gait;Independent with transfers  Able to Take Stairs?: Yes Driving: Yes Comments: ADls, IADLs, driving, retired Art gallery manager: Within Functional Limits Praxis Praxis: Intact  Cognition Overall Cognitive Status: Within Functional Limits for tasks assessed Arousal/Alertness: Awake/alert Orientation Level: Oriented X4 Memory: Appears intact Awareness: Appears intact Problem Solving: Appears  intact Safety/Judgment: Appears intact Sensation Sensation Light Touch: Appears Intact Proprioception: Appears Intact Coordination Gross Motor Movements are Fluid and Coordinated: No Fine Motor Movements are Fluid and Coordinated: Yes Coordination and Movement Description: Impaired due to [employer] pre-cautions/pain Motor  Motor Motor: Within Functional Limits Motor -  Skilled Clinical Observations: Impaired due to [facility] pre-cautions  Mobility Bed Mobility Bed Mobility: Sit to Supine;Supine to Sit Supine to Sit: Supervision/Verbal cueing Sit to Supine: Supervision/Verbal cueing Transfers Transfers: Sit to Stand;Stand Pivot Transfers Sit to Stand: Contact Guard/Touching assist Stand Pivot Transfers: Contact Guard/Touching assist Transfer (Assistive device): Rolling walker Locomotion  Gait Ambulation: Yes Gait Assistance: Contact Guard/Touching assist Gait Distance (Feet): 15 Feet Assistive device: Rolling walker Gait Assistance Details: Verbal cues for sequencing;Verbal cues for technique;Verbal cues for precautions/safety Gait Gait: Yes Gait Pattern: Impaired Gait Pattern: (cues to maintain TDWB LLE) Gait velocity: decreased Stairs / Additional Locomotion Stairs: Yes Stairs Assistance: Minimal Assistance - Patient > 75% Stair Management Technique: One rail Right;Sideways;Step to pattern Number of Stairs: 8 Height of Stairs: 3 Wheelchair Mobility Wheelchair Mobility: Yes Wheelchair Assistance: Chartered loss adjuster: Both upper extremities Wheelchair Parts Management: Needs assistance Distance: 100'  Trunk/Postural Assessment  Cervical Assessment Cervical Assessment: Within Functional Limits Thoracic Assessment Thoracic Assessment: Within Functional Limits Lumbar Assessment Lumbar Assessment: Within Functional Limits Postural Control Postural Control: Within Functional Limits  Balance Balance Balance Assessed: Yes Dynamic Sitting  Balance Dynamic Sitting - Balance Support: During functional activity;Feet supported;No upper extremity supported Sitting balance - Comments: Sitting EOB Static Standing Balance Static Standing - Balance Support: During functional activity;No upper extremity supported Static Standing - Level of Assistance: 5: Stand by assistance Static Standing - Comment/# of Minutes: Sitting EOB to dress Dynamic Standing Balance Dynamic Standing - Balance Support: During functional activity;Right upper extremity supported;Left upper extremity supported Dynamic Standing - Level of Assistance: 4: Min assist Dynamic Standing - Comments: Standing at RW during bathing/dressing tasks Extremity Assessment  RUE Assessment RUE Assessment: Within Functional Limits LUE Assessment LUE Assessment: Within Functional Limits RLE Assessment RLE Assessment: Within Functional Limits General Strength Comments: grossly 4+/5 throughout LLE Assessment LLE Assessment: Within Functional Limits General Strength Comments: grossly 4+/5 throughout   See Function Navigator for Current Functional Status.   Refer to Care Plan for Long Term Goals  Recommendations for other services: None   Discharge Criteria: Patient will be discharged from PT if patient refuses treatment 3 consecutive times without medical reason, if treatment goals not met, if there is a change in medical status, if patient makes no progress towards goals or if patient is discharged from hospital.  The above assessment, treatment plan, treatment alternatives and goals were discussed and mutually agreed upon: by patient and by family  Luberta Mutter 03/28/2018, 1:09 PM

## 2018-03-28 NOTE — Progress Notes (Signed)
[facility] PHYSICAL MEDICINE & REHABILITATION     PROGRESS NOTE  Subjective/Complaints:  Pt seen lying in bed this AM.  He slept well overnight.  He states he feels he needs to have a BM.  He would like to go home soon.   ROS: Denies CP, SOB, N/V/D.  Objective: Vital Signs: Blood pressure 131/79, pulse 97, temperature 98.2 F (36.8 C), resp. rate 18, weight 102.9 kg (226 lb 13.7 oz), SpO2 95 %. No results found. Recent Labs    03/26/18 0500 03/28/18 0615  WBC 13.7* 15.9*  HGB 10.4* 11.1*  HCT 32.9* 35.5*  PLT 604* 631*   Recent Labs    03/26/18 0500 03/28/18 0615  NA 135 138  K 4.1 4.5  CL 101 100  GLUCOSE 179* 147*  BUN 12 9  CREATININE 1.27* 1.24  CALCIUM 7.7* 8.7*   CBG (last 3)  Recent Labs    03/27/18 2134 03/28/18 0624 03/28/18 1229  GLUCAP 223* 146* 176*    Wt Readings from Last 3 Encounters:  03/27/18 102.9 kg (226 lb 13.7 oz)  03/16/18 97.8 kg (215 lb 9.8 oz)  03/18/18 97.5 kg (215 lb)    Physical Exam:  BP 131/79 (BP Location: Right Arm)   Pulse 97   Temp 98.2 F (36.8 C)   Resp 18   Wt 102.9 kg (226 lb 13.7 oz)   SpO2 95%   BMI 29.93 kg/m  Constitutional: He appears well-developed and well-nourished.  HENT: Normocephalic and atraumatic.  Eyes: EOM are normal. No discharge.  Cardiovascular: Irregularly irregular . No JVD. Respiratory: Effort normal and breath sounds normal.  GI: Soft. Bowel sounds are normal. Small reducible umbilical hernia.   Musculoskeletal: Left foot tenderness  Neurological: He is alert and oriented.  Motor: Right upper extremity/right lower extremity: 5/5 proximal distal Left upper extremity: Shoulder abduction, elbow flexion 4+/5, elbow extension 4-/5, wrist extension 2/5, hand intrinsics 2+/5 Left upper extremity: Hip flexion and knee extension 4+-5/5  Skin: Scattered hematomas. Prevena Vac in place on left foot.   Psychiatric: He has a normal mood and affect. His behavior is normal.   Assessment/Plan: 1.  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 Physiatrist is providing close team supervision and 24 hour management of active medical problems listed below. Physiatrist and rehab team continue to assess barriers to discharge/monitor patient progress toward functional and medical goals.  Function:  Bathing Bathing position   Position: Wheelchair/chair at sink  Bathing parts Body parts bathed by patient: Right arm, Left arm, Chest, Abdomen, Front perineal area, Right upper leg, Left upper leg, Left lower leg Body parts bathed by helper: Back, Buttocks  Bathing assist Assist Level: Touching or steadying assistance(Pt > 75%)      Upper Body Dressing/Undressing Upper body dressing   What is the patient wearing?: Pull over shirt/dress     Pull over shirt/dress - Perfomed by patient: Thread/unthread right sleeve, Put head through opening, Pull shirt over trunk, Thread/unthread left sleeve          Upper body assist Assist Level: Touching or steadying assistance(Pt > 75%)      Lower Body Dressing/Undressing Lower body dressing   What is the patient wearing?: Pants, Non-skid slipper socks     Pants- Performed by patient: Thread/unthread right pants leg, Pull pants up/down, Thread/unthread left pants leg     Non-skid slipper socks- Performed by helper: Don/doff left sock(R wound  vac)                  Lower body assist Assist for lower body dressing: Touching or steadying assistance (Pt > 75%)      Toileting Toileting          Toileting assist     Transfers Chair/bed transfer   Chair/bed transfer method: Stand pivot Chair/bed transfer assist level: Touching or steadying assistance (Pt > 75%) Chair/bed transfer assistive device: Water engineer Comprehension Comprehension assist  level: Follows complex conversation/direction with no assist  Expression Expression assist level: Expresses complex ideas: With no assist  Social Interaction Social Interaction assist level: Interacts appropriately with others - No medications needed.  Problem Solving Problem solving assist level: Solves complex 90% of the time/cues < 10% of the time  Memory Memory assist level: Complete Independence: No helper    Medical Problem List and Plan: 1.  Deficits with mobility, transfers, self-care secondary to left transmetatarsal amputation.  Begin CIR 2.  DVT Prophylaxis/Anticoagulation: Pharmaceutical: Other (comment)--eliquis 3. Pain Management:  Oxycodone prn effective.  4. Mood: LCSW to follow for evaluation and support.  5. Neuropsych: This patient is capable of making decisions on his own behalf. 6. Skin/Wound Care:  Continue Prevena VAC till  7/5. To be NWB as unable to maintain TDWB.  7. Fluids/Electrolytes/Nutrition: Monitor I/O.  8. Diabetic foot ulcer/ s/p transmetatarsal amputation: ID recommended IV rocephin  D# 8-- while in hospital followed by a  week of ceftin at discharge.    9. T2 DM: Continue Lantus--wean off over next few days.  Resume Janvia and metformin for tighter control. Continue to hold Invokana and Amaryl for now.   Monitor with increased mobility 10. BPH with retention: Has had multiple recent UTIs with incontinence and frequency/nocturia.   Will monitor voiding, PVRs showing retention  Resumed Flomax.  11. New onset A fib: Monitor HR bid--continue metoprolol with cardizem CD 240 mg daily. On eliquis.  12. Bowel incontinence: Question due to antibiotics, discontinued colace. Monitor for improvement.  13. CKD  Cr 1.24 on 7/2  Cont to monitor 14. Leukocytosis  WBCs 15.9 on 7/2  Afebrile  Cont abx 15. ABLA  Hb 11.1 on 7/2  Cont to monitor 16. Hypoalbuminemia  Supplement initiated on 7/2  LOS (Days) 1 A FACE TO FACE EVALUATION WAS PERFORMED  Pharrah Rottman Lorie Phenix 03/28/2018 2:42 PM

## 2018-03-29 ENCOUNTER — Inpatient Hospital Stay (HOSPITAL_COMMUNITY): Payer: Medicare Other | Admitting: Occupational Therapy

## 2018-03-29 ENCOUNTER — Inpatient Hospital Stay (HOSPITAL_COMMUNITY): Payer: Medicare Other | Admitting: Physical Therapy

## 2018-03-29 ENCOUNTER — Other Ambulatory Visit: Payer: Self-pay

## 2018-03-29 ENCOUNTER — Encounter (HOSPITAL_COMMUNITY): Payer: Medicare Other | Admitting: Psychology

## 2018-03-29 ENCOUNTER — Inpatient Hospital Stay (HOSPITAL_COMMUNITY): Payer: Medicare Other

## 2018-03-29 DIAGNOSIS — L97423 Non-pressure chronic ulcer of left heel and midfoot with necrosis of muscle: Secondary | ICD-10-CM

## 2018-03-29 LAB — GLUCOSE, CAPILLARY
Glucose-Capillary: 144 mg/dL — ABNORMAL HIGH (ref 70–99)
Glucose-Capillary: 173 mg/dL — ABNORMAL HIGH (ref 70–99)
Glucose-Capillary: 119 mg/dL — ABNORMAL HIGH (ref 70–99)
Glucose-Capillary: 169 mg/dL — ABNORMAL HIGH (ref 70–99)

## 2018-03-29 MED ORDER — BETHANECHOL CHLORIDE 10 MG PO TABS
10.0000 mg | ORAL_TABLET | Freq: Four times a day (QID) | ORAL | Status: DC
  Administered 2018-03-29 – 2018-03-31 (×8): 10 mg via ORAL
  Filled 2018-03-29 (×8): qty 1

## 2018-03-29 MED ORDER — TAMSULOSIN HCL 0.4 MG PO CAPS
0.4000 mg | ORAL_CAPSULE | Freq: Two times a day (BID) | ORAL | Status: DC
  Administered 2018-03-29 – 2018-04-01 (×6): 0.4 mg via ORAL
  Filled 2018-03-29 (×6): qty 1

## 2018-03-29 MED ORDER — CHLORHEXIDINE GLUCONATE CLOTH 2 % EX PADS
6.0000 | MEDICATED_PAD | Freq: Every day | CUTANEOUS | Status: DC
  Administered 2018-03-29 – 2018-04-01 (×4): 6 via TOPICAL

## 2018-03-29 MED ORDER — GLIMEPIRIDE 1 MG PO TABS: 4.0000 mg | ORAL_TABLET | Freq: Every day | ORAL | Status: DC

## 2018-03-29 MED ORDER — GLIMEPIRIDE 1 MG PO TABS
3.0000 mg | ORAL_TABLET | Freq: Every day | ORAL | Status: DC
  Administered 2018-03-30 – 2018-04-01 (×3): 3 mg via ORAL
  Filled 2018-03-29 (×4): qty 3

## 2018-03-29 MED ORDER — GLIMEPIRIDE 1 MG PO TABS
2.0000 mg | ORAL_TABLET | Freq: Once | ORAL | Status: AC
  Administered 2018-03-29: 2 mg via ORAL
  Filled 2018-03-29: qty 2

## 2018-03-29 NOTE — Progress Notes (Signed)
Social Work Patient ID: Kevin [name], male   DOB: [birth year], 77 y.o.   MRN: [id number] Met with pt and wife to inform team conference goals mod/i-supervision level and target discharge 7/6. Have begun I & O cath with wife and pt in case needed at discharge. Pt is very pleased with the discharge date. Will continue to work toward discharge.

## 2018-03-29 NOTE — Patient Care Conference (Signed)
Inpatient RehabilitationTeam Conference and Plan of Care Update Date: 03/29/2018   Time: 2:00 PM    Patient Name: Kevin [name]      Medical Record Number: [id number]  Date of Birth: 09/17/1941 Sex: Male         Room/Bed: 4M01C/4M01C-01 Payor Info: Payor: MEDICARE / Plan: MEDICARE PART A AND B / Product Type: *No Product type* /    Admitting Diagnosis: TMA  Admit Date/Time:  03/27/2018  3:56 PM Admission Comments: No comment available   Primary Diagnosis:  <principal problem not specified> Principal Problem: <principal problem not specified>  Patient Active Problem List   Diagnosis Date Noted  . S/P transmetatarsal amputation of foot, left ([place])   . 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 ([place])   . Diabetic ulcer of left midfoot associated with type 2 diabetes mellitus ([place])   . Status post transmetatarsal amputation of left foot ([place])   . Postoperative pain   . Diabetic ulcer of toe of left foot associated with type 2 diabetes mellitus ([place])   . Benign prostatic hyperplasia with urinary retention   . New onset atrial fibrillation ([place])   . Incontinence of feces   . Cerebrovascular accident (CVA) ([place])   . Benign essential HTN   . Diabetes mellitus type 2 in nonobese (HCC)   . Hypokalemia   . PAF (paroxysmal atrial fibrillation) ([place])   . Leukocytosis   . SIRS (systemic inflammatory response syndrome) ([facility])   . Acute blood loss anemia   . Stage 3 chronic kidney disease ([place])   . Essential hypertension 03/20/2018  . Tachycardia 03/20/2018  . Sepsis ([place]) 03/20/2018  . Diabetes ([place]) 03/20/2018  . Subacute osteomyelitis, left ankle and foot ([place]) 03/20/2018  . A-fib ([place]) 03/20/2018  . Stroke-like episode ([place]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 03/16/2018  . Strain of left tibialis anterior muscle 12/19/2015  . 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 Fracture of radius, distal, left, closed 12/19/2012    Expected Discharge Date: Expected Discharge Date:  04/01/18  Team Members Present: Physician leading conference: Dr. Delice Lesch Social Worker Present: Ovidio Kin, LCSW Nurse Present: Other (comment)(Angelina Jorge Ny) PT Present: Kem Parkinson, PT OT Present: Napoleon Form, OT SLP Present: Windell Moulding, SLP PPS Coordinator present : Daiva Nakayama, RN, CRRN     Current Status/Progress Goal Weekly Team Focus  Medical   Deficits with mobility, transfers, self-care secondary to left transmetatarsal amputation  Improve mobility, wound care, DM, BPH, WBCs  See above   Bowel/Bladder   Continent of bowel, inabiltiy to urinate/urinary retention- PVR/Bladder scan, I&O cath ,flomax ordered LBM 07/02  Continent of B/B pt voiding without assistance  bladder scan as ordered with I&O cath prn, assist with toileting q shift and prn laxative prn   Swallow/Nutrition/ Hydration             ADL's   Supervision overall, occasional steadying assist  Mod I overall, supervision shower transfers  Functional mobility, ADL/IADL re-training, d/c planning   Mobility   min guard transfers, gait, stairs  modI transfers, S gait and stairs  UE/LE strength, activity tolerance, maintaining WB precautions   Communication             Safety/Cognition/ Behavioral Observations            Pain   Pt denies any pain  free of pain  assess pain q shift and prn notify MD for any unrelieved pain   Skin   Bruises to Left flank, back , and arm. Pt  has wound vac present on Left foot.   pt will be free from breakdown and infection  assess skin q shift and prn, wound care as directed      *See Care Plan and progress notes for long and short-term goals.     Barriers to Discharge  Current Status/Progress Possible Resolutions Date Resolved   Physician    Medical stability;IV antibiotics;Wound Care     See above  Therapies, D/C vac this week, optimize DM meds, optimize bladder meds, follow labs      Nursing                  PT  Behavior  impulsive              OT                   SLP                SW Medical stability May be short length of stay            Discharge Planning/Teaching Needs:  HOme with wife who can provide assist and has been here to observe therapies with pt. Pt would like to go home soon.      Team Discussion:  Goals mod/i-supervision level. MD has started flomax and urecholine for voiding issues. Currently I & O caths every 6 hours. Nursing has begun teaching with wife and pt. Wound vac to be DC Friday 7/5. Adjusting DM meds. Impulsive at times. Question ned for IV antibiotics at home may switch to po meds. Wife is a retired Therapist, sports.  Revisions to Treatment Plan:  DC 7/6    Continued Need for Acute Rehabilitation Level of Care: The patient requires daily medical management by a physician with specialized training in physical medicine and rehabilitation for the following conditions: Daily direction of a multidisciplinary physical rehabilitation program to ensure safe treatment while eliciting the highest outcome that is of practical value to the patient.: Yes Daily medical management of patient stability for increased activity during participation in an intensive rehabilitation regime.: Yes Daily analysis of laboratory values and/or radiology reports with any subsequent need for medication adjustment of medical intervention for : Post surgical problems;Diabetes problems;Wound care problems;Urological problems;Other  Dupree, Gardiner Rhyme 03/29/2018, 3:01 PM

## 2018-03-29 NOTE — Progress Notes (Signed)
[place] PHYSICAL MEDICINE & REHABILITATION     PROGRESS NOTE  Subjective/Complaints:  Patient seen lying in bed this morning. He states he slept well overnight. He notes he continues to have urinary retention. He notes he had a good first day in therapies yesterday and states he hopes to go home by Saturday.  ROS: Denies CP, SOB, N/V/D.  Objective: Vital Signs: Blood pressure (!) 142/76, pulse 84, temperature 97.8 F (36.6 C), temperature source Oral, resp. rate 18, height 6\' 1"  (1.854 m), weight 102.9 kg (226 lb 13.7 oz), SpO2 96 %. No results found. Recent Labs    03/28/18 0615  WBC 15.9*  HGB 11.1*  HCT 35.5*  PLT 631*   Recent Labs    03/28/18 0615  NA 138  K 4.5  CL 100  GLUCOSE 147*  BUN 9  CREATININE 1.24  CALCIUM 8.7*   CBG (last 3)  Recent Labs    03/28/18 1639 03/28/18 2124 03/29/18 0623  GLUCAP 74 161* 144*    Wt Readings from Last 3 Encounters:  03/27/18 102.9 kg (226 lb 13.7 oz)  03/16/18 97.8 kg (215 lb 9.8 oz)  03/18/18 97.5 kg (215 lb)    Physical Exam:  BP (!) 142/76 (BP Location: Left Arm)   Pulse 84   Temp 97.8 F (36.6 C) (Oral)   Resp 18   Ht 6\' 1"  (1.854 m)   Wt 102.9 kg (226 lb 13.7 oz)   SpO2 96%   BMI 29.93 kg/m  Constitutional: He appears well-developed and well-nourished.  HENT: Normocephalic and atraumatic.  Eyes: EOM are normal. No discharge.  Cardiovascular: Irregularly irregular . No JVD. Respiratory: Effort normal and breath sounds normal.  GI: Soft. Bowel sounds are normal. Small reducible umbilical hernia.   Musculoskeletal: Left foot tenderness  Neurological: He is alert and oriented.  Motor: Right upper extremity/right lower extremity: 5/5 proximal distal Left upper extremity: Shoulder abduction, elbow flexion 4+/5, elbow extension 4-/5, wrist extension 2/5, hand intrinsics 2+/5 (baseline) Left upper extremity: Hip flexion and knee extension 4+-5/5  Skin: Scattered hematomas. Prevena Vac in place on left foot.    Psychiatric: He has a normal mood and affect. His behavior is normal.   Assessment/Plan: 1. 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 Physiatrist is providing close team supervision and 24 hour management of active medical problems listed below. Physiatrist and rehab team continue to assess barriers to discharge/monitor patient progress toward functional and medical goals.  Function:  Bathing Bathing position   Position: Wheelchair/chair at sink  Bathing parts Body parts bathed by patient: Right arm, Left arm, Chest, Abdomen, Front perineal area, Right upper leg, Left upper leg, Left lower leg Body parts bathed by helper: Back, Buttocks  Bathing assist Assist Level: Touching or steadying assistance(Pt > 75%)      Upper Body Dressing/Undressing Upper body dressing   What is the patient wearing?: Pull over shirt/dress     Pull over shirt/dress - Perfomed by patient: Thread/unthread right sleeve, Put head through opening, Pull shirt over trunk, Thread/unthread left sleeve          Upper body assist Assist Level: Touching or steadying assistance(Pt > 75%)      Lower Body Dressing/Undressing Lower body dressing   What is the patient wearing?: Pants, Non-skid slipper socks     Pants- Performed by patient: Thread/unthread right pants leg, Pull pants up/down, Thread/unthread left pants leg  Non-skid slipper socks- Performed by helper: Don/doff left sock(R wound vac)                  Lower body assist Assist for lower body dressing: Touching or steadying assistance (Pt > 75%)      Toileting Toileting   Toileting steps completed by patient: Adjust clothing prior to toileting, Performs perineal hygiene, Adjust clothing after toileting   Toileting Assistive Devices: Grab bar or rail  Toileting assist Assist level: Touching or steadying  assistance (Pt.75%)   Transfers Chair/bed transfer   Chair/bed transfer method: Stand pivot Chair/bed transfer assist level: Touching or steadying assistance (Pt > 75%) Chair/bed transfer assistive device: Environmental manager   Type: Manual Max wheelchair distance: 100 Assist Level: Supervision or verbal cues  Cognition Comprehension Comprehension assist level: Follows complex conversation/direction with no assist  Expression Expression assist level: Expresses complex ideas: With no assist  Social Interaction Social Interaction assist level: Interacts appropriately with others - No medications needed.  Problem Solving Problem solving assist level: Solves complex 90% of the time/cues < 10% of the time  Memory Memory assist level: Complete Independence: No helper    Medical Problem List and Plan: 1.  Deficits with mobility, transfers, self-care secondary to left transmetatarsal amputation.  Cont CIR 2.  DVT Prophylaxis/Anticoagulation: Pharmaceutical: Other (comment)--eliquis 3. Pain Management:  Oxycodone prn effective.  4. Mood: LCSW to follow for evaluation and support.  5. Neuropsych: This patient is capable of making decisions on his own behalf. 6. Skin/Wound Care:  Continue Prevena VAC till  7/5. To be NWB as unable to maintain TDWB.  7. Fluids/Electrolytes/Nutrition: Monitor I/O.  8. Diabetic foot ulcer/ s/p transmetatarsal amputation: ID recommended IV rocephin  D# 9-- while in hospital followed by a  week of ceftin at discharge.    9. T2 DM:   Continue Lantus, will attempt to wean.    Resumed tradjenta, amraryl, and metformin for tighter control.  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   Will monitor voiding, PVRs showing retention  Educate on I/O caths  Resumed Flomax.  11. New onset A fib: Monitor HR bid--continue metoprolol with cardizem CD 240 mg daily. On  eliquis.  12. Bowel incontinence: Question due to antibiotics, discontinued colace. Monitor for improvement.  13. CKD  Cr 1.24 on 7/2  Cont to monitor 14. Leukocytosis  WBCs 15.9 on 7/2  Afebrile  Cont abx 15. ABLA  Hb 11.1 on 7/2  Cont to monitor 16. Hypoalbuminemia  Supplement initiated on 7/2  LOS (Days) 2 A FACE TO FACE EVALUATION WAS PERFORMED  Ankit Lorie Phenix 03/29/2018 8:23 AM

## 2018-03-29 NOTE — Progress Notes (Signed)
Physical Therapy Note  Patient Details  Name: Kevin [name] MRN: [id number] Date of Birth: Mar 20, 1941 Today's Date: 03/29/2018    Attempted to see pt to make up missed time from AM session; pt had just returned to bed and requested to rest at this time to allow for participation in 3pm session. Communicated with scheduling regarding blocking time following lunch as well as previously blocked AM times to allow for pt to digest following meals. Continue per POC as able to tolerate.   [name] 03/29/2018, 1:18 PM

## 2018-03-29 NOTE — Progress Notes (Signed)
Physical Therapy Session Note  Patient Details  Name: Kevin [name] MRN: [id number] Date of Birth: Jun 12, 1941  Today's Date: 03/29/2018 PT Individual Time: 1500-1545 PT Individual Time Calculation (min): 45 min   Short Term Goals: Week 1:  PT Short Term Goal 1 (Week 1): =LTG due to estimated LOS  Skilled Therapeutic Interventions/Progress Updates:   Pt received sitting in WC and agreeable to PT. Pt reports significant fatigue following earlier PT treatments. Pt transported to day room in [facility].   Stand pivot and squat pivot transfers instructed by PT with min assist throughout treatment. Moderate cues from PT and set up safety to decreased use of the LLE and maintain NWB per PA verbal order.   Nustep BUE and RLE strengthening and endurance training 4 bouts of 2 min level 4 with cues for improved ROM and sustained attention to task to improve endurance benefits of exercise.   WC mobility x 141f with supervision assist from PT and min cues for safety through doorways.   Gait training with NWB technique with min-mod assist in parallel bars x 163fforward/backward and and x1274fith min-mod assist and RW with moderate cues for improved safety with AD management and decreased force of heel contact on the RLE.   PT educated pt and wife on the benefit of use of WC for home community mobility to protect residual limb and improve heeling process. Patient returned to room and left sitting EOB with call bell in reach and all needs met.        Therapy Documentation Precautions:  Precautions Precautions: Fall Precaution Comments: TDWB LLE heel, NWB in sole of foot Restrictions Weight Bearing Restrictions: Yes LLE Weight Bearing: Non weight bearing LLE Partial Weight Bearing Percentage or Pounds: heel    Vital Signs: Therapy Vitals Temp: 98.2 F (36.8 C) Temp Source: Oral Pulse Rate: 74 Resp: 17 BP: (!) 103/59 Patient Position (if appropriate): Lying Oxygen Therapy SpO2: 97  % O2 Device: Room Air Pain: Denies at rest.   See Function Navigator for Current Functional Status.   Therapy/Group: Individual Therapy  AusLorie Phenix3/2019, 5:43 PM

## 2018-03-29 NOTE — Progress Notes (Signed)
Physical Therapy Session Note  Patient Details  Name: Kevin [name] MRN: [id number] Date of Birth: [birth year]  Today's Date: 03/29/2018 PT Individual Time: 0900-0958 PT Individual Time Calculation (min): 58 min   Short Term Goals: Week 1:  PT Short Term Goal 1 (Week 1): =LTG due to estimated LOS  Skilled Therapeutic Interventions/Progress Updates:    Pt supine in bed upon PT arrival, agreeable to therapy tx and denies pain. Pt transferred from supine>sitting with supervision. Pt performed squat pivot transfer from bed>w/c with min assist and verbal cues for techniques. Pt propelled w/c from room>gym with supervision using B UEs. Pt performed squat pivot transfer to the mat with min assist and verbal cues for techniques. Pt performed stand pivot transfer from mat<>w/c x 2 in each direction with emphasis on pivoting techniques, verbal cues for L LE precautions, min assist. Pt transferred to supine with supervision and performed LE therex for strengthening to include 2x 10 of each: alternating hip flexion, SLR, sidelying hip abduction, and SAQ. Pt transferred to sitting with supervision. Stand pivot with RW to w/c with min assist. Pt requesting to go outside this session. Pt propelled x 100 ft and x 150 ft with B UEs to go outside. Therapist transported back to room and performed squat pivot to recliner with min assist. Pt left seated with needs in reach.   Therapy Documentation Precautions:  Precautions Precautions: Fall Precaution Comments: TDWB LLE heel, NWB in sole of foot Restrictions Weight Bearing Restrictions: Yes LLE Weight Bearing: Non weight bearing LLE Partial Weight Bearing Percentage or Pounds: heel   See Function Navigator for Current Functional Status.   Therapy/Group: Individual Therapy  Netta Corrigan, PT, DPT 03/29/2018, 9:58 AM

## 2018-03-29 NOTE — Progress Notes (Signed)
Occupational Therapy Session Note  Patient Details  Name: Allie Gerhold MRN: [id number] Date of Birth: 10/13/1940  Today's Date: 03/29/2018 OT Individual Time: 3403-5248 OT Individual Time Calculation (min): 28 min    Short Term Goals: Week 1:  OT Short Term Goal 1 (Week 1): STG=LTG due to LOS  Skilled Therapeutic Interventions/Progress Updates:    Pt seen this session to focus on activity tolerance in standing and balance. Pt completed sit to stands to RW with S.  He would tolerate standing maintaining wt bearing through his heel for approximately 2-3 min at a time. Pt conversed quite a bit and was able to tolerate several rounds of standing.  Neuropsychology arrived to see pt.    Therapy Documentation Precautions:  Precautions Precautions: Fall Precaution Comments: TDWB LLE heel, NWB in sole of foot Restrictions Weight Bearing Restrictions: Yes LLE Weight Bearing: Non weight bearing LLE Partial Weight Bearing Percentage or Pounds: heel     Pain: no c/o pain   ADL:    See Function Navigator for Current Functional Status.   Therapy/Group: Individual Therapy  [place] 03/29/2018, 12:46 PM

## 2018-03-29 NOTE — Progress Notes (Signed)
Patient claims his wife will be the one to do his cath; RN encouraged patient but he refused. Incoming RN made aware.

## 2018-03-29 NOTE — Progress Notes (Signed)
Social Work   Dupree, Eliezer Champagne  Social Worker  Physical Medicine and Rehabilitation  Patient Care Conference  Signed  Date of Service:  03/29/2018  3:00 PM          Signed          Show:Clear all [x] Manual[x] Template[] Copied  Added by: [x] Dupree, Gardiner Rhyme, LCSW   [] Hover for details   Inpatient RehabilitationTeam Conference and Plan of Care Update Date: 03/29/2018   Time: 2:00 PM      Patient Name: Kevin [name]      Medical Record Number: [id number]  Date of Birth: [birth year] Sex: Male         Room/Bed: 4M01C/4M01C-01 Payor Info: Payor: MEDICARE / Plan: MEDICARE PART A AND B / Product Type: *No Product type* /     Admitting Diagnosis: TMA  Admit Date/Time:  03/27/2018  3:56 PM Admission Comments: No comment available    Primary Diagnosis:  <principal problem not specified> Principal Problem: <principal problem not specified>       Patient Active Problem List    Diagnosis Date Noted  . S/P transmetatarsal amputation of foot, left ([place])    . 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 ([place])    . Diabetic ulcer of left midfoot associated with type 2 diabetes mellitus ([place])    . Status post transmetatarsal amputation of left foot ([place])    . Postoperative pain    . Diabetic ulcer of toe of left foot associated with type 2 diabetes mellitus ([place])    . Benign prostatic hyperplasia with urinary retention    . New onset atrial fibrillation ([place])    . Incontinence of feces    . Cerebrovascular accident (CVA) ([place])    . Benign essential HTN    . Diabetes mellitus type 2 in nonobese ([facility])    . Hypokalemia    . PAF (paroxysmal atrial fibrillation) ([place])    . Leukocytosis    . SIRS (systemic inflammatory response syndrome) ([facility])    . Acute blood loss anemia    . Stage 3 chronic kidney disease ([place])    . Essential hypertension 03/20/2018  . Tachycardia 03/20/2018  . Sepsis ([place]) 03/20/2018  . Diabetes ([place]) 03/20/2018  . Subacute osteomyelitis,  left ankle and foot ([place]) 03/20/2018  . A-fib ([place]) 03/20/2018  . 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 Strain of left tibialis anterior muscle 12/19/2015  . 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 Fracture of radius, distal, left, closed 12/19/2012      Expected Discharge Date: Expected Discharge Date: 04/01/18   Team Members Present: Physician leading conference: Dr. Delice Lesch Social Worker Present: Ovidio Kin, LCSW Nurse Present: Other (comment)(Angelina Jorge Ny) PT Present: Kem Parkinson, PT OT Present: Napoleon Form, OT SLP Present: Windell Moulding, SLP PPS Coordinator present : Daiva Nakayama, RN, CRRN       Current Status/Progress Goal Weekly Team Focus  Medical     Deficits with mobility, transfers, self-care secondary to left transmetatarsal amputation  Improve mobility, wound care, DM, BPH, WBCs  See above   Bowel/Bladder     Continent of bowel, inabiltiy to urinate/urinary retention- PVR/Bladder scan, I&O cath ,flomax ordered LBM 07/02  Continent of B/B pt voiding without assistance  bladder scan as ordered with I&O cath prn, assist with toileting q shift and prn laxative prn   Swallow/Nutrition/ Hydration               ADL's     Supervision overall, occasional  steadying assist  Mod I overall, supervision shower transfers  Functional mobility, ADL/IADL re-training, d/c planning   Mobility     min guard transfers, gait, stairs  modI transfers, S gait and stairs  UE/LE strength, activity tolerance, maintaining WB precautions   Communication               Safety/Cognition/ Behavioral Observations             Pain     Pt denies any pain  free of pain  assess pain q shift and prn notify MD for any unrelieved pain   Skin     Bruises to Left flank, back , and arm. Pt has wound vac present on Left foot.   pt will be free from breakdown and infection  assess skin q shift and prn, wound care as directed     *See Care Plan and progress notes for  long and short-term goals.      Barriers to Discharge   Current Status/Progress Possible Resolutions Date Resolved   Physician     Medical stability;IV antibiotics;Wound Care     See above  Therapies, D/C vac this week, optimize DM meds, optimize bladder meds, follow labs      Nursing                 PT  Behavior  impulsive              OT                 SLP            SW Medical stability May be short length of stay             Discharge Planning/Teaching Needs:  HOme with wife who can provide assist and has been here to observe therapies with pt. Pt would like to go home soon.      Team Discussion:  Goals mod/i-supervision level. MD has started flomax and urecholine for voiding issues. Currently I & O caths every 6 hours. Nursing has begun teaching with wife and pt. Wound vac to be DC Friday 7/5. Adjusting DM meds. Impulsive at times. Question ned for IV antibiotics at home may switch to po meds. Wife is a retired Therapist, sports.  Revisions to Treatment Plan:  DC 7/6    Continued Need for Acute Rehabilitation Level of Care: The patient requires daily medical management by a physician with specialized training in physical medicine and rehabilitation for the following conditions: Daily direction of a multidisciplinary physical rehabilitation program to ensure safe treatment while eliciting the highest outcome that is of practical value to the patient.: Yes Daily medical management of patient stability for increased activity during participation in an intensive rehabilitation regime.: Yes Daily analysis of laboratory values and/or radiology reports with any subsequent need for medication adjustment of medical intervention for : Post surgical problems;Diabetes problems;Wound care problems;Urological problems;Other   Elease Hashimoto 03/29/2018, 3:01 PM                 Patient ID: [name], male   DOB: [birth year], 77 y.o.   MRN: [id number]

## 2018-03-29 NOTE — Consult Note (Signed)
Neuropsychological Consultation   Patient:   Kevin [name]   DOB:   25-Dec-1940  MR Number:  [id number]  Location:  [facility] [address] [facility] [address] [id number] [place] [address] Dept: [place]: [phone]           Date of Service:   03/29/2018  Start Time:   11 AM End Time:   12 PM  Provider/Observer:  Ilean Skill, Psy.D.       Clinical Neuropsychologist       Billing Code/Service: [phone] Units  Chief Complaint:    [name] is a 77 year old male with history of T2DM with neuropathic pain and ulcer, hypertension, ulcerative colitis.  The patient was admitted on 03/16/2018 after being found down with left-sided weakness, left facial droop and expressive deficits.  CT of the head was negative and he did receive TPA.  Later, the patient was found to be tachycardic and signs of worsening infection on the left foot ulcer.  The patient was started on broad-spectrum antibiotics due to sepsis.  There is been confusion even prior to admission.  The patient has been placed on 6 weeks of IV antibiotics due to concerns of osteomyelitis.  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  The patient was referred for comprehensive inpatient rehabilitation program due to functional deficits.    Reason for Service:  Mr. Jalomo was referred for neuropsychological consultation due to coping and adjustment issues following transmetatarsal amputation.  Below is the HPI for the current admission.  HPI:  [name] is a 77 year old male with history of T2DM with neuropathic ulcer, HTN, ulcerative colitis who was admitted on 03/16/18 after found down with left sided weakness, left facial droop and expressive deficits. CT head negative and he received tPA.  Next day he was found to have tachycardia with tachypnea as well as foul smelling drainage from left  foot ulcer.  He was started on broad spectrum antibiotics due to sepsis--wife reported issues with confusion prior to admission.  Cardiology consulted due to elevated troponin's and tachycardia. Elevated trops felt to be due to foot infection and 2 D echo done revealing EF 60-65% with no wall abnormality.  MRI brain showed no acute abnormality with mild small vessel disease.  He did developed new onset A fib and was started on apixaban on 06/24.  Plans for cardioversion in the future.  Mesalamine placed on hold at this time.   ID consulted for input and recommended 6 weeks of IV ceftriaxone due to concerns of osteomyelitis and patient underwent I & D left foot ulcer by Dr. Stann Mainland.  He continued to have persistent drainage with cellulitis and developed abscess under 2nd MT head with evidence of osteomyelitis. 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 To be TDWB on heel to NWB LLE for about 2 weeks and VAC to stay in place for a week. ID recommends changing patient to one week of cephalosporin at discharge due to transmet amputation and surgical cultures with group C strep and coag negative staph.  Therapy ongoing and CIR recommended due to functional deficits.     Current Status:  The patient reports that he has had difficulty coping with the ultimate outcome of transmetatarsal amputation.  The patient reports that he is pleased that they were able to say most of his foot and he  hopes to be able to get back to the physical activities that he been doing prior to admission.  The patient reports that his current mental status and cognitive functioning have returned to baseline.  The patient acknowledges that he was dealing with some depressive symptoms immediately after the amputation once his thinking and cognitive function had returned.  However, the patient reports that his mood has been improving recently and he does report improved coping and adaptive  patient to the current situation.  The patient denies any significant symptoms of depression or anxiety at this time.  Behavioral Observation: Nero Sawatzky  presents as a 77 y.o.-year-old Right Caucasian Male who appeared his stated age. his dress was Appropriate and he was Well Groomed and his manners were Appropriate to the situation.  his participation was indicative of Appropriate and Attentive behaviors.  There were any physical disabilities noted.  he displayed an appropriate level of cooperation and motivation.     Interactions:    Active Appropriate and Attentive  Attention:   within normal limits and attention span and concentration were age appropriate  Memory:   within normal limits; recent and remote memory intact  Visuo-spatial:  within normal limits  Speech (Volume):  normal  Speech:   normal; normal  Thought Process:  Coherent and Relevant  Though Content:  WNL; not suicidal and not homicidal  Orientation:   person, place, time/date and situation  Judgment:   Good  Planning:   Good  Affect:    Appropriate  Mood:    Dysphoric  Insight:   Good  Intelligence:   high  Medical History:   Past Medical History:  Diagnosis Date  . Diabetes mellitus without complication ([place])   . Hypertension    Psychiatric History:  The patient denies any prior history of psychiatric illness, depression, or anxiety.  Family Med/Psych History:  Family History  Problem Relation Age of Onset  . Hypertension Father     Risk of Suicide/Violence: low the patient denies any suicidal or homicidal ideation.  Impression/DX:  [name] is a 77 year old male with history of T2DM with neuropathic pain and ulcer, hypertension, ulcerative colitis.  The patient was admitted on 03/16/2018 after being found down with left-sided weakness, left facial droop and expressive deficits.  CT of the head was negative and he did receive TPA.  Later, the patient was found to be  tachycardic and signs of worsening infection on the left foot ulcer.  The patient was started on broad-spectrum antibiotics due to sepsis.  There is been confusion even prior to admission.  The patient has been placed on 6 weeks of IV antibiotics due to concerns of osteomyelitis.  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  The patient was referred for comprehensive inpatient rehabilitation program due to functional deficits.    The patient reports that he has had difficulty coping with the ultimate outcome of transmetatarsal amputation.  The patient reports that he is pleased that they were able to say most of his foot and he hopes to be able to get back to the physical activities that he been doing prior to admission.  The patient reports that his current mental status and cognitive functioning have returned to baseline.  The patient acknowledges that he was dealing with some depressive symptoms immediately after the amputation once his thinking and cognitive function had returned.  However, the patient reports that his mood has been improving recently and  he does report improved coping and adaptive patient to the current situation.  The patient denies any significant symptoms of depression or anxiety at this time.  Diagnosis:    Streptococcal sepsis ([place]) - Plan: Ambulatory referral to Physical Medicine Rehab  S/P transmetatarsal amputation of foot, left [facility]) - Plan: Ambulatory referral to Physical Medicine Rehab         Electronically Signed   _______________________ Ilean Skill, Psy.D.

## 2018-03-29 NOTE — Progress Notes (Signed)
Demonstrated self cath to wife and patient . Wife understood so with patient. RN educated the importance of hygiene before and after cath. Questions answered.

## 2018-03-29 NOTE — Progress Notes (Signed)
Occupational Therapy Session Note  Patient Details  Name: Kevin [name] MRN: [id number] Date of Birth: 01/06/1941  Today's Date: 03/29/2018 OT Individual Time: 1000-1030 OT Individual Time Calculation (min): 30 min  OT Missed Time: 50 Min   Short Term Goals: Week 1:  OT Short Term Goal 1 (Week 1): STG=LTG due to LOS  Skilled Therapeutic Interventions/Progress Updates:    Therapist arrived at 7:30 for scheduled OT session. Pt reports having just finished breakfast and requesting time to digest and requesting therapist to return at later time.  Therapist returned at 10:00, pt voicing increased fatigue after having just finished PT session, however, agreeable to tx session staying in room. Discussed WBing pre-cautions and use of DARCO as delivered by ortho team. OT followed up with pt's PA with order to treat L LE as NWB as pt with difficulty maintaining TDWB and use of DARCO shoe when OOB for skin protection. Information relayed to pt and therapy team. Pt cont to demonstrate some dificutly with comprehending complex verbal directions, however, after several explanations pt able to correctly recall and then at end of session able to recall and explain new pre-cautions.  Pt completed x3 standing trials at RW, standing from recliner with supervision. Practiced dynamic weigh tshifts and alternating UE support in simulation of functional standing task. All completed with supervision Pt declined any ADLs this morning, stating wife wanted to assist when she was present this afternoon. Pt left seated in recliner at end of session awaiting next therapist.   Unable to make up entire missed therapy time from morning session. Pt missed 45 min of scheduled session today.  Therapy Documentation Precautions:  Precautions Precautions: Fall Precaution Comments: TDWB LLE heel, NWB in sole of foot Restrictions Weight Bearing Restrictions: Yes LLE Weight Bearing: Non weight bearing LLE Partial Weight  Bearing Percentage or Pounds: heel Pain No/denies pain  See Function Navigator for Current Functional Status.   Therapy/Group: Individual Therapy  Rounds, Amy L 03/29/2018, 7:05 AM

## 2018-03-30 ENCOUNTER — Inpatient Hospital Stay (HOSPITAL_COMMUNITY): Payer: Medicare Other | Admitting: Occupational Therapy

## 2018-03-30 ENCOUNTER — Inpatient Hospital Stay (HOSPITAL_COMMUNITY): Payer: Medicare Other | Admitting: Physical Therapy

## 2018-03-30 ENCOUNTER — Inpatient Hospital Stay (HOSPITAL_COMMUNITY): Payer: Medicare Other

## 2018-03-30 DIAGNOSIS — A409 Streptococcal sepsis, unspecified: Secondary | ICD-10-CM

## 2018-03-30 LAB — GLUCOSE, CAPILLARY
Glucose-Capillary: 169 mg/dL — ABNORMAL HIGH (ref 70–99)
Glucose-Capillary: 119 mg/dL — ABNORMAL HIGH (ref 70–99)
Glucose-Capillary: 113 mg/dL — ABNORMAL HIGH (ref 70–99)
Glucose-Capillary: 119 mg/dL — ABNORMAL HIGH (ref 70–99)

## 2018-03-30 MED ORDER — LINAGLIPTIN 5 MG PO TABS
8.0000 mg | ORAL_TABLET | Freq: Every day | ORAL | Status: DC
  Administered 2018-03-30 – 2018-04-01 (×3): 7.5 mg via ORAL
  Filled 2018-03-30 (×3): qty 2

## 2018-03-30 NOTE — Plan of Care (Signed)
  Problem: RH KNOWLEDGE DEFICIT LIMB LOSS Goal: RH STG INCREASE KNOWLEDGE OF SELF CARE AFTER LIMB LOSS Outcome: Progressing  Pt continues to have wound vac  Problem: RH BLADDER ELIMINATION Goal: RH STG MANAGE BLADDER WITH ASSISTANCE Description STG Manage Bladder With mod Assistance  Outcome: Progressing  Continue to educate pt on self cath.

## 2018-03-30 NOTE — Plan of Care (Signed)
  Problem: Consults Goal: RH STROKE PATIENT EDUCATION Description See Patient Education module for education specifics  Outcome: Progressing   Problem: RH BLADDER ELIMINATION Goal: RH STG MANAGE BLADDER WITH ASSISTANCE Description STG Manage Bladder With mod Assistance  Outcome: Progressing   Problem: RH SKIN INTEGRITY Goal: RH STG SKIN FREE OF INFECTION/BREAKDOWN Description Skin free of infection/breakdown with min assistance  Outcome: Progressing Goal: RH STG MAINTAIN SKIN INTEGRITY WITH ASSISTANCE Description STG Maintain Skin Integrity With min Assistance.  Outcome: Progressing Goal: RH STG ABLE TO PERFORM INCISION/WOUND CARE W/ASSISTANCE Description STG Able To Perform Incision/Wound Care With min Assistance.  Outcome: Progressing   Problem: RH SAFETY Goal: RH STG ADHERE TO SAFETY PRECAUTIONS W/ASSISTANCE/DEVICE Description STG Adhere to Safety Precautions With min Assistance/Device.  Outcome: Progressing   Problem: RH PAIN MANAGEMENT Goal: RH STG PAIN MANAGED AT OR BELOW PT'S PAIN GOAL Description Pain < or = 3 with min assistance  Outcome: Progressing   Problem: RH KNOWLEDGE DEFICIT Goal: RH STG INCREASE KNOWLEDGE OF DIABETES Description Patient able to verbalize sign of symptoms of hypo and hyperglycemia with min assistance  Outcome: Progressing Goal: RH STG INCREASE KNOWLEDGE OF HYPERTENSION Description Patient able to verbalize sign of symptoms of hypo and hypertension with min assistance  Outcome: Progressing   Problem: Consults Goal: RH LIMB LOSS PATIENT EDUCATION Description Description: See Patient Education module for eduction specifics. Outcome: Progressing Goal: Skin Care Protocol Initiated - if Braden Score 18 or less Description If consults are not indicated, leave blank or document N/A Outcome: Progressing   Problem: RH SKIN INTEGRITY Goal: RH STG SKIN FREE OF INFECTION/BREAKDOWN Description Skin free of infection/breakdown with min  assistance  Outcome: Progressing   Problem: RH KNOWLEDGE DEFICIT LIMB LOSS Goal: RH STG INCREASE KNOWLEDGE OF SELF CARE AFTER LIMB LOSS Outcome: Progressing

## 2018-03-30 NOTE — Progress Notes (Signed)
Social Work Patient ID: Kevin [name], male   DOB: 01/12/1941, 77 y.o.   MRN: [id number]  Met with pt and wife who is here for family training, to discuss equipment needs and follow up. Pt to be switched to po antibiotics so no need for IV at home. Will continue to need follow up therapies and nursing, which referral was made to [facility] while on acute. Prepare for discharge Sat. Would Vac to be discharged tomorrow.

## 2018-03-30 NOTE — Progress Notes (Signed)
Inpatient Diabetes Program Recommendations  AACE/ADA: New Consensus Statement on Inpatient Glycemic Control (2015)  Target Ranges:  Prepandial:   less than 140 mg/dL      Peak postprandial:   less than 180 mg/dL (1-2 hours)      Critically ill patients:  140 - 180 mg/dL   Lab Results  Component Value Date   GLUCAP 119 (H) 03/30/2018   HGBA1C 7.6 (H) 03/17/2018    Review of Glycemic Control Results for Kevin [name], Kevin [name] (MRN [id number]) as of 03/30/2018 12:25  Ref. Range 03/29/2018 16:26 03/29/2018 21:47 03/30/2018 06:36 03/30/2018 12:07  Glucose-Capillary Latest Ref Range: 70 - 99 mg/dL 119 (H) 169 (H) 169 (H) 119 (H)   Diabetes history: Type 2 DM Outpatient Diabetes medications:  Current orders for Inpatient glycemic control: Amaryl 3 mg QAM, Tradjenta 7.5 mg QD, Novolog 0-5 units QHS, Novolog 0-9 units TID, Metformin 1000 mg BID  Inpatient Diabetes Program Recommendations:    Spoke with patient in regards to outpatient diabetes management and in response to consult placed.   Reviewed patient's current A1c of 7.6%. Explained what a A1c is and what it measures. Also reviewed goal A1c with patient, importance of good glucose control @ home, and blood sugar goals. Reviewed basic patho of DM, need for insulin, changes that occur to beta cells with aging and need for oral agents.   Discussed the benefits of well balanced diet, selecting foods based off of nutritional value and carb counting. Consult placed for dietitian per RN. Patient and wife verbalize healthy eating habits, no consumption of sugary beverages, or fried foods. Encouraged continued healthy eating and carb counting for meals.   Encouraged follow up with PCP and continued compliance with oral medications as ordered. Patient and wife able to verbalize when to take. Patient has a meter and checks blood sugars 2-3 times per day. Discussed freestyle libre as an option if insulin is ever started or if patient feels he needs improved  control. At this time, patient has no questions regarding DM.   Thanks, Bronson Curb, MSN, RNC-OB Diabetes Coordinator [phone] (8a-5p)

## 2018-03-30 NOTE — Progress Notes (Signed)
Physical Therapy Session Note  Patient Details  Name: Kevin [name] MRN: [id number] Date of Birth: 07-06-1941  Today's Date: 03/30/2018 PT Individual Time: 1115-1200 PT Individual Time Calculation (min): 45 min   Short Term Goals: Week 1:  PT Short Term Goal 1 (Week 1): =LTG due to estimated LOS  Skilled Therapeutic Interventions/Progress Updates:   Pt's wife [name] present.  They stated pt was very fatigued.  Pt willing to participate.  Strengthening RLE unilaterally using Kinetron in sitting, level 30 cm/sec x 20 cycles targeting quads, x 20 cycles targeting gluteals. Active stretch bil hip abductors via active hip adduction against resistance with feet apart  Gait training in parallel bars with bil UE support, x 4' forward and backward, min guard assist and max cues for shoulder depression.  Pt spontaneously flexed L knee to keep L foot NWBing without cues.   Pt propelled w/c on level tile with supervision.  Cues for brakes and assistance for bil leg rests.    Therapy Documentation Precautions:  Precautions Precautions: Fall Precaution Comments: TDWB LLE heel, NWB in sole of foot Restrictions Weight Bearing Restrictions: Yes LLE Weight Bearing: Non weight bearing LLE Partial Weight Bearing Percentage or Pounds: heel   Pain: none at rest      See Function Navigator for Current Functional Status.   Therapy/Group: Individual Therapy  COOK,CAROLINE 03/30/2018, 12:08 PM

## 2018-03-30 NOTE — Progress Notes (Signed)
Kevin [name] PHYSICAL MEDICINE & REHABILITATION     PROGRESS NOTE  Subjective/Complaints:  Pt seen lying in bed this AM.  He slept well overnight.  He is looking forward to d/c Saturday. He states he was able to self- cath yesterday.  ROS: denies CP, SOB, N/V/D.  Objective: Vital Signs: Blood pressure 126/67, pulse 77, temperature 98.2 F (36.8 C), temperature source Oral, resp. rate 18, height 6\' 1"  (1.854 m), weight 102.9 kg (226 lb 13.7 oz), SpO2 99 %. No results found. Recent Labs    03/28/18 0615  WBC 15.9*  HGB 11.1*  HCT 35.5*  PLT 631*   Recent Labs    03/28/18 0615  NA 138  K 4.5  CL 100  GLUCOSE 147*  BUN 9  CREATININE 1.24  CALCIUM 8.7*   CBG (last 3)  Recent Labs    03/29/18 1626 03/29/18 2147 03/30/18 0636  GLUCAP 119* 169* 169*    Wt Readings from Last 3 Encounters:  03/27/18 102.9 kg (226 lb 13.7 oz)  03/16/18 97.8 kg (215 lb 9.8 oz)  03/18/18 97.5 kg (215 lb)    Physical Exam:  BP 126/67 (BP Location: Left Arm)   Pulse 77   Temp 98.2 F (36.8 C) (Oral)   Resp 18   Ht 6\' 1"  (1.854 m)   Wt 102.9 kg (226 lb 13.7 oz)   SpO2 99%   BMI 29.93 kg/m  Constitutional: He appears well-developed and well-nourished.  HENT: Normocephalic and atraumatic.  Eyes: EOM are normal. No discharge.  Cardiovascular: Irregularly irregular . No JVD. Respiratory: Effort normal and breath sounds normal.  GI: Soft. Bowel sounds are normal. Small reducible umbilical hernia.   Musculoskeletal: Left foot tenderness, stable. No edema Neurological: He is alert and oriented.  Motor: Right upper extremity/right lower extremity: 5/5 proximal distal Left upper extremity: Shoulder abduction, elbow flexion 4+/5 elbow extension 4-/5, wrist extension 2/5, hand intrinsics 2+/5 (baseline) Left upper extremity: Hip flexion and knee extension 4+-5/5  (stable)  Skin: Scattered hematomas. Prevena Vac in place on left foot.   Psychiatric: He has a normal mood and affect. His  behavior is normal.   Assessment/Plan: 1. 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 Physiatrist is providing close team supervision and 24 hour management of active medical problems listed below. Physiatrist and rehab team continue to assess barriers to discharge/monitor patient progress toward functional and medical goals.  Function:  Bathing Bathing position   Position: Wheelchair/chair at sink  Bathing parts Body parts bathed by patient: Right arm, Left arm, Chest, Abdomen, Front perineal area, Right upper leg, Left upper leg, Left lower leg Body parts bathed by helper: Back, Buttocks  Bathing assist Assist Level: Touching or steadying assistance(Pt > 75%)      Upper Body Dressing/Undressing Upper body dressing   What is the patient wearing?: Pull over shirt/dress     Pull over shirt/dress - Perfomed by patient: Thread/unthread right sleeve, Put head through opening, Pull shirt over trunk, Thread/unthread left sleeve          Upper body assist Assist Level: Touching or steadying assistance(Pt > 75%)      Lower Body Dressing/Undressing Lower body dressing   What is the patient wearing?: Pants, Non-skid slipper socks     Pants- Performed by patient: Thread/unthread right pants leg, Pull pants up/down, Thread/unthread left pants leg     Non-skid slipper socks- Performed by helper: Don/doff  left sock(R wound vac)                  Lower body assist Assist for lower body dressing: Touching or steadying assistance (Pt > 75%)      Toileting Toileting   Toileting steps completed by patient: Adjust clothing prior to toileting, Performs perineal hygiene, Adjust clothing after toileting   Toileting Assistive Devices: Grab bar or rail  Toileting assist Assist level: Touching or steadying assistance (Pt.75%)   Transfers Chair/bed transfer    Chair/bed transfer method: Squat pivot, Stand pivot Chair/bed transfer assist level: Moderate assist (Pt 50 - 74%/lift or lower) Chair/bed transfer assistive device: Armrests     Locomotion Ambulation     Max distance: 12 Assist level: Moderate assist (Pt 50 - 74%)   Wheelchair   Type: Manual Max wheelchair distance: 150 Assist Level: Supervision or verbal cues  Cognition Comprehension Comprehension assist level: Follows complex conversation/direction with no assist  Expression Expression assist level: Expresses complex ideas: With no assist  Social Interaction Social Interaction assist level: Interacts appropriately with others - No medications needed.  Problem Solving Problem solving assist level: Solves complex 90% of the time/cues < 10% of the time  Memory Memory assist level: Complete Independence: No helper    Medical Problem List and Plan: 1.  Deficits with mobility, transfers, self-care secondary to left transmetatarsal amputation.  Cont CIR 2.  DVT Prophylaxis/Anticoagulation: Pharmaceutical: Other (comment)--eliquis 3. Pain Management:  Oxycodone prn effective.  4. Mood: LCSW to follow for evaluation and support.  5. Neuropsych: This patient is capable of making decisions on his own behalf. 6. Skin/Wound Care:  Continue Prevena VAC until 7/5. To be NWB as unable to maintain TDWB.  7. Fluids/Electrolytes/Nutrition: Monitor I/O.  8. Diabetic foot ulcer/ s/p transmetatarsal amputation: ID recommended IV rocephin  D# 10-- while in hospital followed by a  week of ceftin at discharge.    9. T2 DM:   Lantus increased to 8 units on 7/4.    Resumed tradjenta, amraryl, and metformin for tighter control.  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   Will monitor voiding, PVRs showing retention  Educate on I/O caths, patient learning to self Cath  Flomax increased,  Bethanechol started on 7/3.  11. New onset A fib: Monitor HR bid--continue metoprolol with cardizem CD 240 mg daily. On eliquis.  12. Bowel incontinence: Question due to antibiotics, discontinued colace. Monitor for improvement.  13. CKD  Cr 1.24 on 7/2  Cont to monitor 14. Leukocytosis  WBCs 15.9 on 7/2  Afebrile  Cont abx 15. ABLA  Hb 11.1 on 7/2  Cont to monitor 16. Hypoalbuminemia  Supplement initiated on 7/2  LOS (Days) 3 A FACE TO FACE EVALUATION WAS PERFORMED  Kevin [name] 03/30/2018 7:48 AM

## 2018-03-30 NOTE — Progress Notes (Signed)
Pt was able to master in and out cath with a little encouragement. Pt voids, but not emptying bladder completely. He is experiencing some discomfort may need lidocaine. He feels comfortable doing his in and out. He will start to empty his bladder every two hours. He would like to stand when emptying bladder. There is minimal blood visible when cathing. Staff will continue to assist, educating, and motivating.

## 2018-03-30 NOTE — Progress Notes (Signed)
Physical Therapy Session Note  Patient Details  Name: Kevin [name] MRN: [id number] Date of Birth: 06-13-1941  Today's Date: 03/30/2018 PT Individual Time: 0900-1000 PT Individual Time Calculation (min): 60 min   Short Term Goals: Week 1:  PT Short Term Goal 1 (Week 1): =LTG due to estimated LOS  Skilled Therapeutic Interventions/Progress Updates: Pt received supine in bed, denies pain and agreeable to treatment. Supine>sit modI. Pants donned setupA to manage wound vac, min guard for balance while standing with RW to pull up pants and stand pivot to w/c. W/c propulsion BUE x150' for strengthening and endurance. Educated pt re: sound limb care, daily skin checks, proper footwear. Squat pivot x2 w/c <>mat table with S; encouraged use of squat pivot as primary transfer method as pt impulsive in standing and unsteady d/t WB precautions. Seated and supine LE strengthening exercises with 2# ankle weights including bridging on bolster, straight leg raise, sidelying hip abduction straight leg raise, long arc quads, seated hip flexion marching all 2x15 reps each. Provided pt with handout of LE exercises to perform independently. Returned to w/c as above. Returned to room w/c propulsion modI. Squat pivot transfer back to bed with S. Sit >supine modI. Remained supine in bed, alarm intact and all needs in reach.      Therapy Documentation Precautions:  Precautions Precautions: Fall Precaution Comments: TDWB LLE heel, NWB in sole of foot Restrictions Weight Bearing Restrictions: Yes LLE Weight Bearing: Non weight bearing LLE Partial Weight Bearing Percentage or Pounds: heel   See Function Navigator for Current Functional Status.   Therapy/Group: Individual Therapy  Luberta Mutter 03/30/2018, 10:06 AM

## 2018-03-30 NOTE — Progress Notes (Signed)
Occupational Therapy Session Note  Patient Details  Name: Kevin [name] MRN: [id number] Date of Birth: 03-25-1941  Today's Date: 03/30/2018 OT Individual Time: 1330-1415 OT Individual Time Calculation (min): 45 min   Skilled Therapeutic Interventions/Progress Updates: Upon approach for therapy, patient stated he was "sleep deprived" and extremely fatigued and had completed all the therapy he needs before Saturday discharge.   He and his wife cordially built rapport with this clinican and shared info such as home set up while he completed endurance activities and triceps exercises and upper body stretching lying in his bed.  Call bell and phone with in patient reach at end of session.   His wife supportively sat by as she worked on/utilized her iPad.  Patient stated he is now NWB through entire left foot. His wife confirmed the statement made by her husband.     Therapy Documentation Precautions:  Fall  NWB LLE General: General OT Amount of Missed Time: 15 Minutes(15) due to c/o extreme fatigue Vital Signs: Therapy Vitals Temp: 98.1 F (36.7 C) Temp Source: Oral Pulse Rate: 71 Resp: 18 BP: (!) 99/52 Patient Position (if appropriate): Lying Oxygen Therapy SpO2: 98 % O2 Device: Room Air Pain:denied   Therapy/Group: Individual Therapy  Alfredia Ferguson [facility] 03/30/2018, 3:57 PM

## 2018-03-30 NOTE — Progress Notes (Signed)
Occupational Therapy Session Note  Patient Details  Name: Kevin [name] MRN: [id number] Date of Birth: Nov 04, 1940  Today's Date: 03/30/2018 OT Individual Time: 1005-1100 OT Individual Time Calculation (min): 55 min    Short Term Goals: Week 1:  OT Short Term Goal 1 (Week 1): STG=LTG due to LOS  Skilled Therapeutic Interventions/Progress Updates:    Pt seen for OT session focusing on ADL re-training and functional transfers. Pt in supine upon arrival, voicing increased fatigue, however, agreeable to tx session. Throughout session, completed functional transfers via squat pivot with supervision, requiring cuing for w/c parts management.  Bathing/dressing completed from w/c level at sink, pt standing at sink ledge to pull pants up. Trialed standing to void, pt able to manage clothing and urinal following cuing for safety awareness/ sequencing, but able to complete with supervision. In ADL apartment, completed functional transfer w/c<> low soft surface couch. Pt able to manage w/c parts with cuing, completed transfer x2 trials with supervision. Pt returned to room at end of session, left seated in w/c with all needs in reach and wife present.  Education/ discussion regarding d/c planning, continuum of care, and activity progression.   Therapy Documentation Precautions:  Precautions Precautions: Fall Precaution Comments: TDWB LLE heel, NWB in sole of foot Restrictions Weight Bearing Restrictions: Yes LLE Weight Bearing: Non weight bearing LLE Partial Weight Bearing Percentage or Pounds: heel Pain:   No/denies pain  See Function Navigator for Current Functional Status.   Therapy/Group: Individual Therapy  Rounds, Amy L 03/30/2018, 6:42 AM

## 2018-03-31 ENCOUNTER — Inpatient Hospital Stay (HOSPITAL_COMMUNITY): Payer: Medicare Other | Admitting: Physical Therapy

## 2018-03-31 ENCOUNTER — Inpatient Hospital Stay (HOSPITAL_COMMUNITY): Payer: Medicare Other | Admitting: Occupational Therapy

## 2018-03-31 ENCOUNTER — Inpatient Hospital Stay (HOSPITAL_COMMUNITY): Payer: Medicare Other

## 2018-03-31 DIAGNOSIS — R339 Retention of urine, unspecified: Secondary | ICD-10-CM

## 2018-03-31 DIAGNOSIS — R5381 Other malaise: Secondary | ICD-10-CM

## 2018-03-31 LAB — CBC
HCT: 28.8 % — ABNORMAL LOW (ref 39.0–52.0)
HCT: 30.9 % — ABNORMAL LOW (ref 39.0–52.0)
Hemoglobin: 8.9 g/dL — ABNORMAL LOW (ref 13.0–17.0)
Hemoglobin: 9.7 g/dL — ABNORMAL LOW (ref 13.0–17.0)
MCH: 28.5 pg (ref 26.0–34.0)
MCH: 29 pg (ref 26.0–34.0)
MCHC: 30.9 g/dL (ref 30.0–36.0)
MCHC: 31.4 g/dL (ref 30.0–36.0)
MCV: 92.3 fL (ref 78.0–100.0)
MCV: 92.2 fL (ref 78.0–100.0)
Platelets: 398 10*3/uL (ref 150–400)
Platelets: 485 10*3/uL — ABNORMAL HIGH (ref 150–400)
RBC: 3.12 MIL/uL — ABNORMAL LOW (ref 4.22–5.81)
RBC: 3.35 MIL/uL — ABNORMAL LOW (ref 4.22–5.81)
RDW: 13.3 % (ref 11.5–15.5)
RDW: 13.2 % (ref 11.5–15.5)
WBC: 11.5 10*3/uL — ABNORMAL HIGH (ref 4.0–10.5)
WBC: 14.9 10*3/uL — ABNORMAL HIGH (ref 4.0–10.5)

## 2018-03-31 LAB — GLUCOSE, CAPILLARY
Glucose-Capillary: 157 mg/dL — ABNORMAL HIGH (ref 70–99)
Glucose-Capillary: 101 mg/dL — ABNORMAL HIGH (ref 70–99)
Glucose-Capillary: 132 mg/dL — ABNORMAL HIGH (ref 70–99)
Glucose-Capillary: 123 mg/dL — ABNORMAL HIGH (ref 70–99)

## 2018-03-31 MED ORDER — GLIMEPIRIDE 1 MG PO TABS: 1.0000 mg | ORAL_TABLET | Freq: Every day | ORAL | 0 refills | Status: DC

## 2018-03-31 MED ORDER — APIXABAN 5 MG PO TABS: 5.0000 mg | ORAL_TABLET | Freq: Two times a day (BID) | ORAL | 0 refills | Status: DC

## 2018-03-31 MED ORDER — BETHANECHOL CHLORIDE 25 MG PO TABS: 25.0000 mg | ORAL_TABLET | Freq: Four times a day (QID) | ORAL | 0 refills | Status: DC

## 2018-03-31 MED ORDER — DILTIAZEM HCL ER COATED BEADS 240 MG PO CP24: 240.0000 mg | ORAL_CAPSULE | Freq: Every day | ORAL | 0 refills | Status: DC

## 2018-03-31 MED ORDER — BETHANECHOL CHLORIDE 25 MG PO TABS
25.0000 mg | ORAL_TABLET | Freq: Four times a day (QID) | ORAL | Status: DC
  Administered 2018-03-31 – 2018-04-01 (×4): 25 mg via ORAL
  Filled 2018-03-31 (×4): qty 1

## 2018-03-31 MED ORDER — ROSUVASTATIN CALCIUM 10 MG PO TABS: 5.0000 mg | ORAL_TABLET | Freq: Every day | ORAL | Status: DC

## 2018-03-31 MED ORDER — TAMSULOSIN HCL 0.4 MG PO CAPS: 0.4000 mg | ORAL_CAPSULE | Freq: Two times a day (BID) | ORAL | 0 refills | Status: DC

## 2018-03-31 MED ORDER — FAMOTIDINE 20 MG PO TABS: 20.0000 mg | ORAL_TABLET | Freq: Two times a day (BID) | ORAL | 0 refills | Status: DC

## 2018-03-31 MED ORDER — CEFUROXIME AXETIL 250 MG PO TABS: 250.0000 mg | ORAL_TABLET | Freq: Two times a day (BID) | ORAL | 0 refills | Status: AC

## 2018-03-31 MED ORDER — METOPROLOL SUCCINATE ER 100 MG PO TB24
ORAL_TABLET | ORAL | 0 refills | Status: DC
Start: 2018-03-31 — End: 2018-04-27

## 2018-03-31 NOTE — Plan of Care (Addendum)
  RD consulted for nutrition education regarding diabetes.   Lab Results  Component Value Date   HGBA1C 7.6 (H) 03/17/2018    RD provided "Carbohydrate Counting for People with Diabetes" handout from the Academy of [employer]etics. Family at bedside. Pt and family tries to following a carbohydrate modified diet at home. Discussed different food groups and their effects on blood sugar, emphasizing carbohydrate-containing foods. Provided list of carbohydrates and recommended serving sizes of common foods.  Discussed importance of controlled and consistent carbohydrate intake throughout the day. Provided examples of ways to balance meals/snacks and encouraged intake of high-fiber, whole grain complex carbohydrates. Teach back method used.  Expect good compliance.  Body mass index is 29.93 kg/m. Pt meets criteria for overweight based on current BMI.  Current diet order is carbohydrate modified, patient is consuming approximately 100% of meals at this time. Labs and medications reviewed. Pt currently has Prostat ordered and has been consuming them. No further nutrition interventions warranted at this time. RD contact information provided. If additional nutrition issues arise, please re-consult RD. Plans for discharge tomorrow.   Corrin Parker, MS, RD, LDN Pager # [phone] After hours/ weekend pager # [phone]

## 2018-03-31 NOTE — Progress Notes (Signed)
Occupational Therapy Discharge Summary  Patient Details  Name: Kevin [name] MRN: [id number] Date of Birth: 1941-07-29   Patient has met 4 of 7 long term goals due to improved activity tolerance, improved balance, postural control and ability to compensate for deficits.  Patient to discharge at overall Supervision- mod I level.  Patient's care partner is independent to provide the necessary physical assistance at discharge.  Pt's wife has been present throughout rehab admission and is aware of pt's CLOF. She voices and demonstrates willing and ableness to provide the needed assist at d/c.  Pt currently NWB through L LE due to inability to maintain TDWB pre-cautions. Use DARCO shoe for skin protection.   Reason Goals Not Met: Recommending supervision for mobility and standing tasks due to pt's decrease attention to task and impulsivity leading to high fall risk.   Recommendation:  Patient will benefit from ongoing skilled OT services in home health setting to continue to advance functional skills in the area of BADL, iADL and Reduce care partner burden.  Equipment: RW, w/c, drop arm BSC. Has privately purchased shower chair.  Reasons for discharge: treatment goals met and discharge from hospital  Patient/family agrees with progress made and goals achieved: Yes  OT Discharge Precautions/Restrictions  Precautions Precautions: Fall Precaution Comments: To be treated as NWB L LE with L DARCO shoe Required Braces or Orthoses: Other Brace/Splint Other Brace/Splint: Darco shoe Restrictions Weight Bearing Restrictions: Yes LLE Weight Bearing: Non weight bearing LLE Partial Weight Bearing Percentage or Pounds: heel Other Position/Activity Restrictions: Darco Shoe Vision Baseline Vision/History: Wears glasses Wears Glasses: At all times Patient Visual Report: No change from baseline Vision Assessment?: No apparent visual deficits Perception  Perception: Within Functional  Limits Praxis Praxis: Intact Cognition Overall Cognitive Status: Within Functional Limits for tasks assessed Arousal/Alertness: Awake/alert Orientation Level: Oriented X4 Memory: Appears intact Awareness: Appears intact Problem Solving: Appears intact Safety/Judgment: Appears intact Comments: Some impulsivity Sensation Sensation Light Touch: Impaired Detail Light Touch Impaired Details: Impaired RLE;Impaired LLE(B neuropathy) Proprioception: Appears Intact Coordination Gross Motor Movements are Fluid and Coordinated: Yes Fine Motor Movements are Fluid and Coordinated: Yes Coordination and Movement Description: Impaired due to [place] pre-cautions/pain Motor  Motor Motor: Within Functional Limits Mobility  Bed Mobility Bed Mobility: Sit to Supine;Supine to Sit Supine to Sit: Supervision/Verbal cueing Sit to Supine: Supervision/Verbal cueing Transfers Sit to Stand: Supervision/Verbal cueing  Trunk/Postural Assessment  Cervical Assessment Cervical Assessment: Within Functional Limits Thoracic Assessment Thoracic Assessment: Within Functional Limits Lumbar Assessment Lumbar Assessment: Within Functional Limits Postural Control Postural Control: Within Functional Limits  Balance Balance Balance Assessed: Yes Dynamic Sitting Balance Dynamic Sitting - Balance Support: During functional activity;Feet supported;No upper extremity supported Dynamic Sitting - Level of Assistance: 6: Modified independent (Device/Increase time) Static Standing Balance Static Standing - Balance Support: During functional activity;Right upper extremity supported;Left upper extremity supported Static Standing - Level of Assistance: 6: Modified independent (Device/Increase time) Static Standing - Comment/# of Minutes: Standing with RW Dynamic Standing Balance Dynamic Standing - Balance Support: During functional activity;Right upper extremity supported;Left upper extremity supported Dynamic Standing  - Level of Assistance: 6: Modified independent (Device/Increase time);5: Stand by assistance Dynamic Standing - Balance Activities: Other (comment)(gait) Dynamic Standing - Comments: Standing at RW during ADL tasks Extremity/Trunk Assessment RUE Assessment RUE Assessment: Within Functional Limits LUE Assessment LUE Assessment: Within Functional Limits   See Function Navigator for Current Functional Status.  Rounds, Amy L 03/31/2018, 12:44 PM

## 2018-03-31 NOTE — Progress Notes (Signed)
Occupational Therapy Session Note  Patient Details  Name: Kevin [name] MRN: [id number] Date of Birth: 09-07-1941  Today's Date: 03/31/2018 OT Individual Time: 0903-1000 OT Individual Time Calculation (min): 57 min    Short Term Goals: Week 1:  OT Short Term Goal 1 (Week 1): STG=LTG due to LOS  Skilled Therapeutic Interventions/Progress Updates:    Pt seen for OT ADL bathing/dressing session. Pt received sitting on BSC, called appropriately when ready to stand. Completed hygiene and clothing management standing at RW supervision-mod I level. Pt completed stand pivot and squat pivot transfers throughout session. He cont to require occasional using for sequencing/technique of transfers and safety awareness.  Bathing/dressing completed from w/c level at sink. Pt required VCs for attention to task as pt having difficulty multi-tasking conversation with therapist while organizing clothing, sequencing, etc of dressing task.  In ADL apartment, had pt complete squat pivot transfer to low soft surface couch, pt able to manage all w/c parts mod I with increased time following eduction regarding need for concentration and focus during transfers for safety. Pt returned to room at end of session, returned to supine and left with all needs in reach and bed alarm on.    Therapy Documentation Precautions:  Precautions Precautions: Fall Precaution Comments: TDWB LLE heel, NWB in sole of foot Restrictions Weight Bearing Restrictions: Yes LLE Weight Bearing: Non weight bearing LLE Partial Weight Bearing Percentage or Pounds: heel Pain:   No/denies pain  See Function Navigator for Current Functional Status.   Therapy/Group: Individual Therapy  Rounds, Amy L 03/31/2018, 6:36 AM

## 2018-03-31 NOTE — Discharge Summary (Signed)
Physician Discharge Summary  Patient ID: Kevin [name] MRN: [id number] DOB/AGE: 1941/08/30 77 y.o.  Admit date: 03/27/2018 Discharge date: 04/01/2018  Discharge Diagnoses:  Principal Problem:   Debility Active Problems:   Stroke-like episode ([place]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   Essential hypertension   Diabetes mellitus type 2 in nonobese (HCC)   Leukocytosis   S/P transmetatarsal amputation of foot, left (HCC)   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 (HCC)   Diabetic ulcer of left midfoot associated with type 2 diabetes mellitus ([place])   Urinary retention with incomplete bladder emptying   Discharged Condition: stable   Significant Diagnostic Studies: N/A   Labs:  Basic Metabolic Panel: BMP Latest Ref Rng & Units 03/28/2018 03/26/2018 03/25/2018  Glucose 70 - 99 mg/dL 147(H) 179(H) 171(H)  BUN 8 - 23 mg/dL 9 12 12   Creatinine 0.61 - 1.24 mg/dL 1.24 1.27(H) 1.22  Sodium 135 - 145 mmol/L 138 135 138  Potassium 3.5 - 5.1 mmol/L 4.5 4.1 3.9  Chloride 98 - 111 mmol/L 100 101 103  CO2 22 - 32 mmol/L 30 27 29   Calcium 8.9 - 10.3 mg/dL 8.7(L) 7.7(L) 8.2(L)    CBC: Recent Labs  Lab 03/28/18 0615 03/31/18 0424 03/31/18 1259  WBC 15.9* 11.5* 14.9*  NEUTROABS 11.5*  --   --   HGB 11.1* 8.9* 9.7*  HCT 35.5* 28.8* 30.9*  MCV 91.0 92.3 92.2  PLT 631* 398 485*    CBG: Recent Labs  Lab 03/31/18 0652 03/31/18 1213 03/31/18 1630 03/31/18 2138 04/01/18 0640  GLUCAP 157* 101* 132* 123* 146*    Brief HPI:    Kevin [name] is a 77 year old male with history of T2DM with neuropathic ulcer, ulcerative colitis, hypertension who was found down on 626-20-19 with left-sided weakness left facial droop and expressive deficits.  CT of head was negative and he received TPA.  Next day he was found to have tachycardia tachycardia tachypnea and found drainage from left foot ulcer felt to be due to foot infection and sepsis.  He was started on IV antibiotics for treatment.  Cardiology was  consulted for input due to A. fib and 2D echo done showing EF of 60 to 65% without wall abnormality.  Eliquis added with plans for cardioversion in the future.  Patient underwent I&D of left foot ulcer by Dr. Stann Mainland but continued to have persistent drainage with cellulitis despite IV antibiotics.  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 Given.  To be TDWB to NWB left heel for 2 weeks.  ID recommended IV antibiotics while inpatient and transitioning to 7 day course cephalosporin at discharge.  Therapy was ongoing and CIR was recommended due to functional deficits in mobility and self-care tasks.   Hospital Course: Kevin [name] was admitted to rehab 03/27/2018 for inpatient therapies to consist of PT and OT at least three hours five days a week. Past admission physiatrist, therapy team and rehab RN have worked together to provide customized collaborative inpatient rehab.  He was maintained on IV Rocephin throughout his stay and transition to Ceftin at discharge.  He is to continue on this for additional week of antibiotic coverage.  Diabetes was monitored with ECHO his CBG checks.  Tradjenta, Amaryl and metformin was resumed and Amaryl was increased to 3 mg a day for tighter control.   Invokana was DC'd and patient to follow-up with primary MD for further input on medication adjustment.  He has been maintained on Eliquis throughout his stay.  Acute blood loss anemia noted and reactive leukocytosis is stable without any fevers or other signs of infection.  Patient reported problems with frequency/urgency and PVR check showed urinary retention with volumes up to 800 cc.  Patient was started on Urecholine which was titrated to 25 mg 4 times daily and Flomax was increased to twice daily.  He has been educated on in and out caths at least  4-5 times a day to keep bladder volumes below 400 cc.  Is also been educated on  decreasing fluid intake after 7 PM.  Wound VAC was removed on 7/5 and oozing noted from wound edges.  Sutures remain in place with minimal edema and no sign signs of infection.  Patient to continue dry dressing changes on twice daily basis and follow-up with [name] Given in the next week.  Patient has made good progress and is currently supervision to modified independent level. He will continue to receive follow-up home health PT, home health OT and home health RN by [facility] past discharge   Rehab course: During patient's stay in rehab weekly team conferences were held to monitor patient's progress, set goals and discuss barriers to discharge. At admission, patient required min assist with mobility and basic self-care tasks. He  has had improvement in activity tolerance, balance, postural control as well as ability to compensate for deficits.  He is able to complete ADL tasks with supervision to modified independent level.  He requires supervision for functional transfers and contact-guard assist for ambulation with rolling walker due to decreased safety awareness and impulsivity.  Wife is a retired [employer] scientist, has been very supportive and presented for therapies daily.  She has been educated on all aspects of care and safety.  Disposition: Home  Diet: Carb modified medium  Special Instructions: 1.  Was wash incision with normal saline and apply a dry compressive dressing.  Keep left lower extremity elevated when in bed or chair. 2.  Monitor blood sugars before meals at bedtime basis and record. 3.  Continue in and out caths at least 4 times a day to keep volumes below 400 cc.   Discharge Instructions    Ambulatory referral to Physical Medicine Rehab   Complete by:  As directed    1-2 weeks transitional care appt     Allergies as of 04/01/2018      Reactions   Sulfa Antibiotics Rash      Medication List    STOP taking these medications   aspirin EC 81 MG tablet   INVOKANA 100 MG Tabs  tablet Generic drug:  canagliflozin     TAKE these medications   apixaban 5 MG Tabs tablet Commonly known as:  ELIQUIS Take 1 tablet (5 mg total) by mouth 2 (two) times daily.   bethanechol 25 MG tablet Commonly known as:  URECHOLINE Take 1 tablet (25 mg total) by mouth 4 (four) times daily.   cefUROXime 250 MG tablet Commonly known as:  CEFTIN Take 1 tablet (250 mg total) by mouth 2 (two) times daily for 7 days.   diltiazem 240 MG 24 hr capsule Commonly known as:  CARDIZEM CD Take 1 capsule (240 mg total) by mouth daily.   famotidine 20 MG tablet Commonly known as:  PEPCID Take 1 tablet (20 mg total) by mouth 2 (two) times daily.   FLUoxetine 20 MG capsule Commonly known as:  PROZAC Take 20 mg by mouth daily.   glimepiride  4 MG tablet Commonly known as:  AMARYL Take 2 mg by mouth daily with breakfast. What changed:  Another medication with the same name was added. Make sure you understand how and when to take each.   glimepiride 1 MG tablet Commonly known as:  AMARYL Take 1 tablet (1 mg total) by mouth daily with breakfast. What changed:  You were already taking a medication with the same name, and this prescription was added. Make sure you understand how and when to take each.   mesalamine 0.375 g 24 hr capsule Commonly known as:  APRISO Take 750 mg by mouth 2 (two) times daily.   metFORMIN 500 MG tablet Commonly known as:  GLUCOPHAGE Take 1,000 mg by mouth 2 (two) times daily with a meal.   metoprolol succinate 100 MG 24 hr tablet Commonly known as:  TOPROL-XL Take 1.5 pills daily following a meal. What changed:    how much to take  how to take this  when to take this  additional instructions   multivitamin with minerals Tabs tablet Take 1 tablet by mouth daily.   rosuvastatin 10 MG tablet Commonly known as:  CRESTOR Take 0.5 tablets (5 mg total) by mouth daily. What changed:  how much to take   sitaGLIPtin 100 MG tablet Commonly known as:   JANUVIA Take 100 mg by mouth daily.   tamsulosin 0.4 MG Caps capsule Commonly known as:  FLOMAX Take 1 capsule (0.4 mg total) by mouth 2 times daily at 12 noon and 4 pm. What changed:  when to take this      Follow-up Information    Jamse Arn, MD Follow up.   Specialty:  Physical Medicine and Rehabilitation Why:  Office will call you with follow up appointment Contact information: [address] [place] [address] [phone]        [name], MD. Call in 1 day(s).   Specialty:  Orthopedic Surgery Why:  for follow up appointment Contact information: [place] [address] [phone]        [name], MD Follow up on 04/11/2018.   Specialty:  Family Medicine Why:  APPOINTMENT @ 4:15 BE THERE @ 4:00 PM Contact information: [address], [place] [address] [phone]        Bjorn Loser, MD Follow up on 04/05/2018.   Specialty:  Urology Why:  Appointment at 8:45 am. Contact information: [place] [address] [phone]        [facility]. Call.   Why:  for follow up appointment in 6 weeks Contact information: [address]     [place] [address] [phone]          Signed: Bary Leriche 04/03/2018, 5:44 PM

## 2018-03-31 NOTE — Plan of Care (Signed)
  Problem: RH BLADDER ELIMINATION Goal: RH STG MANAGE BLADDER WITH ASSISTANCE Description STG Manage Bladder With mod Assistance  Outcome: Progressing  Voiding, PVR, and in and out cath when needed

## 2018-03-31 NOTE — Discharge Instructions (Signed)
Inpatient Rehab Discharge Instructions  Kevin [name] Discharge date and time:  04/01/18  Activities/Precautions/ Functional Status: Activity: no lifting, driving, or strenuous exercise  till cleared by MD Diet: diabetic diet Wound Care: Cleanse with normal saline, pat dry and apply dry dressing daily.   Functional status:  ___ No restrictions     ___ Walk up steps independently _X__ 24/7 supervision/assistance   ___ Walk up steps with assistance ___ Intermittent supervision/assistance  ___ Bathe/dress independently ___ Walk with walker     _X__ Bathe/dress with assistance ___ Walk Independently    ___ Shower independently ___ Walk with assistance    ___ Shower with assistance _X__ No alcohol     ___ Return to work/school ________   Special Instructions: 1. Monitor fluid intake. Cath at least four times a day.  2. May touchdown on left heel or no weight on left foot 3. Check blood sugars before meals and at bedtime--show your numbers to Dr. Marisue Humble.    COMMUNITY REFERRALS UPON DISCHARGE:    Home Health:   PT, OT, RN  Agency:[facility] [place]   Date of last service:04/01/2018  Medical Equipment/Items Ordered:WHEELCHAIR,  [place]   [phone]   GENERAL COMMUNITY RESOURCES FOR PATIENT/FAMILY: Support Groups:[facility] SECOND Tuesday @ 7:00-8:30 PM AT [place] [phone]  Apixaban oral tablets What is this medicine? APIXABAN (a PIX a ban) is an anticoagulant (blood thinner). It is used to lower the chance of stroke in people with a medical condition called atrial fibrillation. It is also used to treat or prevent blood clots in the lungs or in the veins. This medicine may be used for other purposes; ask your health care provider or pharmacist if you have questions. COMMON BRAND NAME(S): Eliquis What should I tell my health  care provider before I take this medicine? They need to know if you have any of these conditions: -bleeding disorders -bleeding in the brain -blood in your stools (black or tarry stools) or if you have blood in your vomit -history of stomach bleeding -kidney disease -liver disease -mechanical heart valve -an unusual or allergic reaction to apixaban, other medicines, foods, dyes, or preservatives -pregnant or trying to get pregnant -breast-feeding How should I use this medicine? Take this medicine by mouth with a glass of water. Follow the directions on the prescription label. You can take it with or without food. If it upsets your stomach, take it with food. Take your medicine at regular intervals. Do not take it more often than directed. Do not stop taking except on your doctor's advice. Stopping this medicine may increase your risk of a blot clot. Be sure to refill your prescription before you run out of medicine. Talk to your pediatrician regarding the use of this medicine in children. Special care may be needed. Overdosage: If you think you have taken too much of this medicine contact a poison control center or emergency room at once. NOTE: This medicine is only for you. Do not share this medicine with others. What if I miss a dose? If you miss a dose, take it as soon as you can. If it is almost time for your next dose, take only that dose. Do not take double or extra doses. What may interact with this medicine? This medicine may interact with the following: -aspirin and aspirin-like medicines -certain medicines for fungal infections like ketoconazole and itraconazole -certain medicines for seizures like  carbamazepine and phenytoin -certain medicines that treat or prevent blood clots like warfarin, enoxaparin, and dalteparin -clarithromycin -NSAIDs, medicines for pain and inflammation, like ibuprofen or naproxen -rifampin -ritonavir -St. John's wort This list may not describe all  possible interactions. Give your health care provider a list of all the medicines, herbs, non-prescription drugs, or dietary supplements you use. Also tell them if you smoke, drink alcohol, or use illegal drugs. Some items may interact with your medicine. What should I watch for while using this medicine? Visit your doctor or health care professional for regular checks on your progress. Notify your doctor or health care professional and seek emergency treatment if you develop breathing problems; changes in vision; chest pain; severe, sudden headache; pain, swelling, warmth in the leg; trouble speaking; sudden numbness or weakness of the face, arm or leg. These can be signs that your condition has gotten worse. If you are going to have surgery or other procedure, tell your doctor that you are taking this medicine. What side effects may I notice from receiving this medicine? Side effects that you should report to your doctor or health care professional as soon as possible: -allergic reactions like skin rash, itching or hives, swelling of the face, lips, or tongue -signs and symptoms of bleeding such as bloody or black, tarry stools; red or dark-brown urine; spitting up blood or brown material that looks like coffee grounds; red spots on the skin; unusual bruising or bleeding from the eye, gums, or nose This list may not describe all possible side effects. Call your doctor for medical advice about side effects. You may report side effects to FDA at 1-800-FDA-1088. Where should I keep my medicine? Keep out of the reach of children. Store at room temperature between 20 and 25 degrees C (68 and 77 degrees F). Throw away any unused medicine after the expiration date. NOTE: This sheet is a summary. It may not cover all possible information. If you have questions about this medicine, talk to your doctor, pharmacist, or health care provider.  2018 Elsevier/Gold Standard (2016-04-05 11:54:23)  My questions  have been answered and I understand these instructions. I will adhere to these goals and the provided educational materials after my discharge from the hospital.  Patient/Caregiver Signature _______________________________ Date __________  Clinician Signature _______________________________________ Date __________  Please bring this form and your medication list with you to all your follow-up doctor's appointments.

## 2018-03-31 NOTE — Progress Notes (Signed)
Physical Therapy Session Note  Patient Details  Name: Kevin [name] MRN: [id number] Date of Birth: 12-22-1940  Today's Date: 03/31/2018 PT Individual Time: 4163-8453 PT Individual Time Calculation (min): 55 min   Short Term Goals: Week 1:  PT Short Term Goal 1 (Week 1): =LTG due to estimated LOS  Skilled Therapeutic Interventions/Progress Updates: Pt received in recliner with wife present. Denies pain and agreeable to treatment. Pt reports he was "freaked out" by seeing L wound after wound vac was removed, states he is nervous to do anything that will hurt it. Therapist reviewed recommendations for remaining NWB with the exception of stairs, and primarily remaining w/c level while wound is healing, as well as to protect intact limb from excessive stress and injury. Reviewed ace wrapping techniques for L foot to facilitate edema reduction. Demo'ed to wife how to collapse w/c for transport, and to manage w/c up/down curb step or over threshold; wife returned demonstration without cues. Provided handouts with instructions for transporting w/c up/down stairs, and information regarding local support group. Handoff to PA at end of session.      Therapy Documentation Precautions:  Precautions Precautions: Fall Precaution Comments: To be treated as NWB L LE with L DARCO shoe Required Braces or Orthoses: Other Brace/Splint Other Brace/Splint: Darco shoe Restrictions Weight Bearing Restrictions: Yes LLE Weight Bearing: Non weight bearing LLE Partial Weight Bearing Percentage or Pounds: heel Other Position/Activity Restrictions: Darco Shoe   See Function Navigator for Current Functional Status.   Therapy/Group: Individual Therapy  Luberta Mutter 03/31/2018, 3:05 PM

## 2018-03-31 NOTE — Progress Notes (Signed)
Occupational Therapy Session Note  Patient Details  Name: Kevin [name] MRN: [id number] Date of Birth: 07/11/1941  Today's Date: 03/31/2018 OT Individual Time: 1330-1400 OT Individual Time Calculation (min): 30 min    Short Term Goals: Week 1:  OT Short Term Goal 1 (Week 1): STG=LTG due to LOS  Skilled Therapeutic Interventions/Progress Updates:    Pt received sitting up in w/c with wife present agreeable to therapy with on c/o pain. Care coordination with primary OT re transfers and LTG. Pt practiced several stand pivot transfers bed <>w/c with (S) overall. Pt practiced transfer to rocking recliner to simulate rocking chair at pt's house with RW and approaching anteriorly, requiring (S) overall and vc for technique. Discussion with pt and wife throughout session re d/c planning, esp safety and fall prevention in the house. Pt was returned to room with RN present and all needs met.   Therapy Documentation Precautions:  Precautions Precautions: Fall Precaution Comments: To be treated as NWB L LE with L DARCO shoe Required Braces or Orthoses: Other Brace/Splint Other Brace/Splint: Darco shoe Restrictions Weight Bearing Restrictions: Yes LLE Weight Bearing: Non weight bearing LLE Partial Weight Bearing Percentage or Pounds: heel Other Position/Activity Restrictions: Darco Shoe General: General PT Missed Treatment Reason: Nursing care Vital Signs: Therapy Vitals Temp: 98.5 F (36.9 C) Temp Source: Oral Pulse Rate: 66 Resp: 18 BP: 115/67 Patient Position (if appropriate): Sitting Oxygen Therapy SpO2: 98 % O2 Device: Room Air Pain: Pain Assessment Pain Scale: 0-10 Pain Score: 0-No pain   Vision Baseline Vision/History: Wears glasses Wears Glasses: At all times Patient Visual Report: No change from baseline Vision Assessment?: No apparent visual deficits Perception  Perception: Within Functional Limits Praxis Praxis: Intact  See Function Navigator for Current  Functional Status.   Therapy/Group: Individual Therapy  Sandra H Davis 03/31/2018, 3:19 PM  

## 2018-03-31 NOTE — Progress Notes (Signed)
Physical Therapy Session Note  Patient Details  Name: Kevin [name] MRN: [id number] Date of Birth: 09/03/1941  Today's Date: 03/31/2018   Short Term Goals: Week 1:  PT Short Term Goal 1 (Week 1): =LTG due to estimated LOS  Skilled Therapeutic Interventions/Progress Updates:  Pt received sitting in recliner and reports that he too fatigued from therapy throughout the day to participate. PT educated pt on the importance of safety and wound care upon d/c to maximize long term functional gains. Pt left sitting in recliner with call bell in reach and all needs met.      Therapy Documentation Precautions:  Precautions Precautions: Fall Precaution Comments: TDWB LLE heel, NWB in sole of foot Restrictions Weight Bearing Restrictions: Yes LLE Weight Bearing: Non weight bearing LLE Partial Weight Bearing Percentage or Pounds: heel General:   missed time: 30 min- fatigue. Unwilling to participate Vital Signs: Therapy Vitals Temp: 98.4 F (36.9 C) Temp Source: Oral Pulse Rate: 79 Resp: 18 BP: 126/64 Patient Position (if appropriate): Lying Oxygen Therapy SpO2: 92 % O2 Device: Room Air   See Function Navigator for Current Functional Status.   Therapy/Group: Individual Therapy  Lorie Phenix 03/31/2018, 8:26 AM

## 2018-03-31 NOTE — Progress Notes (Signed)
Social Work  Discharge Note  The overall goal for the admission was met for: DC SAT 7/6  Discharge location: Yes-HOME WITH WIFE PROVIDING 24 HR SUPERVISION  Length of Stay: Yes-5 DAYS  Discharge activity level: Yes-SUPERVISION-MOD/I LEVEL  Home/community participation: Yes  Services provided included: MD, RD, PT, OT, RN, CM, Pharmacy, Neuropsych and SW  Financial Services: Medicare and Private Insurance: [place]  Follow-up services arranged: Home Health: [facility]-PT,OT,RN, DME: [facility]-WHEELCHAIR, DROP-[facility] [place] and Patient/Family request agency HH: [facility], DME: [facility]  Comments (or additional information):WIFE WAS HERE DAILY AND PARTICIPATED IN THERAPIES WITH PT, SHE IS A FORMER RN. TAUGHT HER I & O CATHS. READY FOR DISCHARGE TOMORROW.  Patient/Family verbalized understanding of follow-up arrangements: Yes  Individual responsible for coordination of the follow-up plan: MARIA-WIFE  Confirmed correct DME delivered: Elease Hashimoto 03/31/2018    Elease Hashimoto

## 2018-03-31 NOTE — Progress Notes (Signed)
Physical Therapy Discharge Summary  Patient Details  Name: Kevin [name] MRN: [id number] Date of Birth: 01/08/1941  Today's Date: 03/31/2018 PT Individual Time: 1115-1200 PT Individual Time Calculation (min): 45 min    Patient has met 2 of 9 long term goals due to improved activity tolerance and improved balance.  Patient to discharge at a wheelchair level Supervision.   Patient's care partner is independent to provide the necessary physical assistance at discharge.  Reasons goals not met: Patient is at a Supervision level for functional transfers and CGA for gait with RW due to decreased safety awareness and impulsivity and therefore did not meet Mod I goals.  Recommendation:  Patient will benefit from ongoing skilled PT services in home health setting to continue to advance safe functional mobility, address ongoing impairments in balance, strength, endurance, safety with transfers, and minimize fall risk.  Equipment: RW and manual w/c  Reasons for discharge: treatment goals met and discharge from hospital  Patient/family agrees with progress made and goals achieved: Yes   Skilled Intervention: Pt received supine in bed, nurse tech performing bladder scan and assisting pt with cathing. Pt missed 15 minutes of scheduled therapy time due to nursing intervention. No complaints of pain. Bed mobility Mod I. Squat pivot transfer bed to/from w/c with Supervision. Manual w/c propulsion x 150 ft with BUE Mod I. Pt does require assist for leg rest management. Car transfer with Supervision, v/c for safe transfer technique and w/c setup prior to transfer. Sit to stand with CGA to RW, ambulation x 15 ft with RW and CGA, v/c for NWB on LLE. Supine to/from sit from flat mat Mod I. Pt's wife present during therapy session and safe to provide Supervision level assist for pt with transfers. Pt left seated in recliner in room with needs in reach and set up for lunch, wife present.  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) Proprioception: Appears Intact Coordination Gross Motor Movements are Fluid and Coordinated: Yes Fine Motor Movements are Fluid and Coordinated: Yes Coordination and Movement Description: Impaired due to Kevin [name] pre-cautions/pain Motor  Motor Motor: Within Functional Limits  Mobility Bed Mobility Bed Mobility: Sit to Supine;Supine to Sit Supine to Sit: Supervision/Verbal cueing Sit to Supine: Supervision/Verbal cueing Transfers Transfers: Sit to Stand;Stand Pivot Transfers Sit to Stand: Supervision/Verbal cueing Stand Pivot Transfers: Supervision/Verbal cueing Stand Pivot Transfer Details: Verbal cues for precautions/safety Transfer (Assistive device): None Locomotion  Gait Ambulation: Yes Gait Assistance: Contact Guard/Touching assist Gait Distance (Feet): 15 Feet Assistive device: Rolling walker Gait Assistance Details: Verbal cues for sequencing;Verbal cues for technique;Verbal cues for precautions/safety Gait Gait: Yes Gait Pattern: Impaired Gait Pattern: (cues for NWB LLE) Gait velocity: decreased Wheelchair Mobility Wheelchair Mobility: Yes Wheelchair Assistance: Chartered loss adjuster: Both upper extremities Wheelchair Parts Management: Needs assistance Distance: 150 ft  Trunk/Postural Assessment  Cervical Assessment Cervical Assessment: Within Functional Limits Thoracic Assessment Thoracic  Assessment: Within Functional Limits Lumbar Assessment Lumbar Assessment: Within Functional Limits Postural Control Postural Control: Within Functional Limits  Balance Balance Balance Assessed: Yes Dynamic Sitting Balance Dynamic Sitting - Balance Support: During functional activity;Feet supported;No upper extremity  supported Dynamic Sitting - Level of Assistance: 5: Stand by assistance Static Standing Balance Static Standing - Balance Support: During functional activity;Bilateral upper extremity supported Static Standing - Level of Assistance: 5: Stand by assistance Dynamic Standing Balance Dynamic Standing - Balance Support: During functional activity;Bilateral upper extremity supported Dynamic Standing - Level of Assistance: 4: Min assist Dynamic Standing - Balance Activities: Other (comment)(gait) Extremity Assessment   RLE Assessment RLE Assessment: Within Functional Limits General Strength Comments: grossly 4+/5 throughout LLE Assessment LLE Assessment: Within Functional Limits General Strength Comments: grossly 4+/5 throughout   See Function Navigator for Current Functional Status.  Excell Seltzer, PT, DPT  03/31/2018, 12:07 PM

## 2018-03-31 NOTE — Progress Notes (Addendum)
[facility] PHYSICAL MEDICINE & REHABILITATION     PROGRESS NOTE  Subjective/Complaints:  Pt seen lying in bed this morning. He states he slept well overnight. He states he continues to have bladder issues, but notes that some of this was premorbid. He states he is looking for to going home tomorrow.  ROS: denies CP, SOB, N/V/D.  Objective: Vital Signs: Blood pressure 126/64, pulse 79, temperature 98.4 F (36.9 C), temperature source Oral, resp. rate 18, height 6\' 1"  (1.854 m), weight 102.9 kg (226 lb 13.7 oz), SpO2 92 %. No results found. Recent Labs    03/31/18 0424  WBC 11.5*  HGB 8.9*  HCT 28.8*  PLT 398   No results for input(s): NA, K, CL, GLUCOSE, BUN, CREATININE, CALCIUM in the last 72 hours.  Invalid input(s): CO CBG (last 3)  Recent Labs    03/30/18 1644 03/30/18 2204 03/31/18 0652  GLUCAP 113* 119* 157*    Wt Readings from Last 3 Encounters:  03/27/18 102.9 kg (226 lb 13.7 oz)  03/16/18 97.8 kg (215 lb 9.8 oz)  03/18/18 97.5 kg (215 lb)    Physical Exam:  BP 126/64 (BP Location: Left Arm)   Pulse 79   Temp 98.4 F (36.9 C) (Oral)   Resp 18   Ht 6\' 1"  (1.854 m)   Wt 102.9 kg (226 lb 13.7 oz)   SpO2 92%   BMI 29.93 kg/m  Constitutional: He appears well-developed and well-nourished.  HENT: Normocephalic and atraumatic.  Eyes: EOM are normal. No discharge.  Cardiovascular: Irregularly irregular . No JVD. Respiratory: Effort normal and breath sounds normal.  GI: soft. Bowel sounds are normal. Small reducible umbilical hernia, stable.   Musculoskeletal: Left foot tenderness, stable. No edema Neurological: He is alert and oriented.  Motor: Right upper extremity/right lower extremity: 5/5 proximal distal Left upper extremity: Shoulder abduction, elbow flexion 4+/5 elbow extension 4-/5, wrist extension 2/5, hand intrinsics 2+/5 (baseline) Left upper extremity: Hip flexion and knee extension 4+-5/5  (unchanged)  Skin: Scattered hematomas. Prevena Vac in  place on left foot.   Psychiatric: He has a normal mood and affect. His behavior is normal.   Assessment/Plan: 1. 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 Physiatrist is providing close team supervision and 24 hour management of active medical problems listed below. Physiatrist and rehab team continue to assess barriers to discharge/monitor patient progress toward functional and medical goals.  Function:  Bathing Bathing position   Position: Wheelchair/chair at sink  Bathing parts Body parts bathed by patient: Right arm, Left arm, Chest, Abdomen Body parts bathed by helper: Back  Bathing assist Assist Level: Touching or steadying assistance(Pt > 75%)      Upper Body Dressing/Undressing Upper body dressing   What is the patient wearing?: Pull over shirt/dress     Pull over shirt/dress - Perfomed by patient: Thread/unthread right sleeve, Put head through opening, Pull shirt over trunk, Thread/unthread left sleeve          Upper body assist Assist Level: Set up   Set up : To obtain clothing/put away  Lower Body Dressing/Undressing Lower body dressing   What is the patient wearing?: Pants     Pants- Performed by patient: Thread/unthread right pants leg, Pull pants up/down, Thread/unthread left pants leg     Non-skid slipper socks- Performed by helper: Don/doff left sock(R wound vac)  Lower body assist Assist for lower body dressing: Supervision or verbal cues      Toileting Toileting   Toileting steps completed by patient: Adjust clothing prior to toileting, Performs perineal hygiene, Adjust clothing after toileting Toileting steps completed by helper: Adjust clothing prior to toileting, Performs perineal hygiene, Adjust clothing after toileting Toileting Assistive Devices: Grab bar or rail  Toileting assist Assist level:  Set up/obtain supplies, Touching or steadying assistance (Pt.75%)   Transfers Chair/bed transfer   Chair/bed transfer method: Stand pivot Chair/bed transfer assist level: Touching or steadying assistance (Pt > 75%) Chair/bed transfer assistive device: Bedrails     Locomotion Ambulation     Max distance: 4 Assist level: Touching or steadying assistance (Pt > 75%)   Wheelchair   Type: Manual Max wheelchair distance: 150 Assist Level: Supervision or verbal cues  Cognition Comprehension Comprehension assist level: Follows complex conversation/direction with no assist  Expression Expression assist level: Expresses complex ideas: With no assist  Social Interaction Social Interaction assist level: Interacts appropriately with others - No medications needed.  Problem Solving Problem solving assist level: Solves complex problems: Recognizes & self-corrects  Memory Memory assist level: Complete Independence: No helper    Medical Problem List and Plan: 1.  Deficits with mobility, transfers, self-care secondary to left transmetatarsal amputation.  Cont CIR, plan for DC tomorrow 2.  DVT Prophylaxis/Anticoagulation: Pharmaceutical: Other (comment)--eliquis 3. Pain Management:  Oxycodone prn effective.  4. Mood: LCSW to follow for evaluation and support.  5. Neuropsych: This patient is capable of making decisions on his own behalf. 6. Skin/Wound Care:  Will DC Prevena VAC today. To be NWB as unable to maintain TDWB.  7. Fluids/Electrolytes/Nutrition: Monitor I/O.  8. Diabetic foot ulcer/ s/p transmetatarsal amputation: ID recommended IV rocephin  D# 11-- while in hospital followed by a  week of ceftin at discharge.    9. T2 DM:   Resumed tradjenta, amraryl, and metformin for tighter control.  Elevated, but improving. 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   Will  monitor voiding, PVRs showing retention  Educate on I/O caths, patient learning to self Cath  Flomax increased  Bethanechol started on 7/3, increased on 7/5.  11. New onset A fib: Monitor HR bid--continue metoprolol with cardizem CD 240 mg daily. On eliquis.  12. Bowel incontinence: Question due to antibiotics, discontinued colace. Monitor for improvement.  13. CKD  Cr 1.24 on 7/2  Cont to monitor 14. Leukocytosis  WBCs 11.5 on 7/5  Afebrile  Cont abx 15. ABLA  Hb 8.9 on 7/5,? Erroneous value  Cont to monitor 16. Hypoalbuminemia  Supplement initiated on 7/2  LOS (Days) 4 A FACE TO FACE EVALUATION WAS PERFORMED  Ankit Lorie Phenix 03/31/2018 8:24 AM

## 2018-04-01 DIAGNOSIS — I10 Essential (primary) hypertension: Secondary | ICD-10-CM

## 2018-04-01 DIAGNOSIS — R5381 Other malaise: Secondary | ICD-10-CM

## 2018-04-01 LAB — GLUCOSE, CAPILLARY: Glucose-Capillary: 146 mg/dL — ABNORMAL HIGH (ref 70–99)

## 2018-04-01 NOTE — Progress Notes (Signed)
Patient discharged to home per wheelchair accompanied by NT and wife. Wife claims patient had about 300cc output before discharged. Patient claims they had been doing in and out cath. Dressing changed and educated wife and patient. Discharge instructions done yesterday. No further questions noted.

## 2018-04-01 NOTE — Progress Notes (Signed)
Patient ID: Kevin [name], male   DOB: September 06, 1941, 77 y.o.   MRN: [id number]   Kevin [name] is a 77 y.o. male  Admitted for CIR with functional deficits secondary to left transmetatarsal amputation.  He has done quite well and had a good night.  Excited about discharge today.  Discharge planning complete.  Patient will complete additional antibiotic therapy with Ceftin  Subjective: No new complaints. No new problems. Slept well. Feeling OK.  Past Medical History:  Diagnosis Date  . Diabetes mellitus without complication ([place])   . Hypertension      Objective: Vital signs in last 24 hours: Temp:  [97.8 F (36.6 C)-98.8 F (37.1 C)] 97.8 F (36.6 C) (07/06 0605) Pulse Rate:  [66-74] 74 (07/06 0605) Resp:  [18-19] 19 (07/06 0605) BP: (115-126)/(66-67) 119/67 (07/06 0605) SpO2:  [91 %-98 %] 95 % (07/06 0605) Weight change:  Last BM Date: 03/31/18  Intake/Output from previous day: 07/05 0701 - 07/06 0700 In: 720 [P.O.:720] Out: 3200 [Urine:3200] Last cbgs: CBG (last 3)  Recent Labs    03/31/18 1630 03/31/18 2138 04/01/18 0640  GLUCAP 132* 123* 146*     Physical Exam General: No apparent distress   HEENT: not dry Lungs: Normal effort. Lungs clear to auscultation, no crackles or wheezes. Cardiovascular: Heart rhythm slightly irregular Abdomen: S/NT/ND; BS(+) Musculoskeletal:  unchanged Neurological: No new neurological deficits Wounds: Left foot wrapped Skin: clear PICC line left upper arm Mental state: Alert, oriented, cooperative    Lab Results: BMET    Component Value Date/Time   NA 138 03/28/2018 0615   K 4.5 03/28/2018 0615   CL 100 03/28/2018 0615   CO2 30 03/28/2018 0615   GLUCOSE 147 (H) 03/28/2018 0615   BUN 9 03/28/2018 0615   CREATININE 1.24 03/28/2018 0615   CALCIUM 8.7 (L) 03/28/2018 0615   GFRNONAA 56 (L) 03/28/2018 0615   GFRAA >60 03/28/2018 0615   CBC    Component Value Date/Time   WBC 14.9 (H) 03/31/2018 1259   RBC  3.35 (L) 03/31/2018 1259   HGB 9.7 (L) 03/31/2018 1259   HCT 30.9 (L) 03/31/2018 1259   PLT 485 (H) 03/31/2018 1259   MCV 92.2 03/31/2018 1259   MCH 29.0 03/31/2018 1259   MCHC 31.4 03/31/2018 1259   RDW 13.2 03/31/2018 1259   LYMPHSABS 2.8 03/28/2018 0615   MONOABS 1.1 (H) 03/28/2018 0615   EOSABS 0.3 03/28/2018 0615   BASOSABS 0.1 03/28/2018 0615    Studies/Results: No results found.  Medications: I have reviewed the patient's current medications.  Assessment/Plan:  Status post left transmetatarsal amputation.  Will DC PICC line today Discharge planning complete Continue Ceftin antibiotic therapy post hospital Diabetes mellitus stable Atrial fibrillation continue Eliquis and rate control   Length of stay, days: 5  Marletta Lor , MD 04/01/2018, 8:01 AM

## 2018-04-03 DIAGNOSIS — Z89432 Acquired absence of left foot: Secondary | ICD-10-CM | POA: Diagnosis not present

## 2018-04-03 DIAGNOSIS — Z792 Long term (current) use of antibiotics: Secondary | ICD-10-CM | POA: Diagnosis not present

## 2018-04-03 DIAGNOSIS — E1140 Type 2 diabetes mellitus with diabetic neuropathy, unspecified: Secondary | ICD-10-CM | POA: Diagnosis not present

## 2018-04-03 DIAGNOSIS — Z7984 Long term (current) use of oral hypoglycemic drugs: Secondary | ICD-10-CM | POA: Diagnosis not present

## 2018-04-03 DIAGNOSIS — I129 Hypertensive chronic kidney disease with stage 1 through stage 4 chronic kidney disease, or unspecified chronic kidney disease: Secondary | ICD-10-CM | POA: Diagnosis not present

## 2018-04-03 DIAGNOSIS — Z7901 Long term (current) use of anticoagulants: Secondary | ICD-10-CM | POA: Diagnosis not present

## 2018-04-03 DIAGNOSIS — Z8673 Personal history of transient ischemic attack (TIA), and cerebral infarction without residual deficits: Secondary | ICD-10-CM | POA: Diagnosis not present

## 2018-04-03 DIAGNOSIS — N401 Enlarged prostate with lower urinary tract symptoms: Secondary | ICD-10-CM | POA: Diagnosis not present

## 2018-04-03 DIAGNOSIS — E1122 Type 2 diabetes mellitus with diabetic chronic kidney disease: Secondary | ICD-10-CM | POA: Diagnosis not present

## 2018-04-03 DIAGNOSIS — K5190 Ulcerative colitis, unspecified, without complications: Secondary | ICD-10-CM | POA: Diagnosis not present

## 2018-04-03 DIAGNOSIS — N183 Chronic kidney disease, stage 3 (moderate): Secondary | ICD-10-CM | POA: Diagnosis not present

## 2018-04-03 DIAGNOSIS — E1169 Type 2 diabetes mellitus with other specified complication: Secondary | ICD-10-CM | POA: Diagnosis not present

## 2018-04-03 DIAGNOSIS — M86272 Subacute osteomyelitis, left ankle and foot: Secondary | ICD-10-CM | POA: Diagnosis not present

## 2018-04-03 DIAGNOSIS — I480 Paroxysmal atrial fibrillation: Secondary | ICD-10-CM | POA: Diagnosis not present

## 2018-04-03 DIAGNOSIS — Z4781 Encounter for orthopedic aftercare following surgical amputation: Secondary | ICD-10-CM | POA: Diagnosis not present

## 2018-04-04 ENCOUNTER — Telehealth: Payer: Self-pay

## 2018-04-04 DIAGNOSIS — Z4781 Encounter for orthopedic aftercare following surgical amputation: Secondary | ICD-10-CM | POA: Diagnosis not present

## 2018-04-04 DIAGNOSIS — I129 Hypertensive chronic kidney disease with stage 1 through stage 4 chronic kidney disease, or unspecified chronic kidney disease: Secondary | ICD-10-CM | POA: Diagnosis not present

## 2018-04-04 DIAGNOSIS — I480 Paroxysmal atrial fibrillation: Secondary | ICD-10-CM | POA: Diagnosis not present

## 2018-04-04 DIAGNOSIS — M86272 Subacute osteomyelitis, left ankle and foot: Secondary | ICD-10-CM | POA: Diagnosis not present

## 2018-04-04 DIAGNOSIS — E1169 Type 2 diabetes mellitus with other specified complication: Secondary | ICD-10-CM | POA: Diagnosis not present

## 2018-04-04 DIAGNOSIS — E1140 Type 2 diabetes mellitus with diabetic neuropathy, unspecified: Secondary | ICD-10-CM | POA: Diagnosis not present

## 2018-04-04 NOTE — Telephone Encounter (Signed)
Called pt for Transition Call and no answer left message for pt to call back.

## 2018-04-05 DIAGNOSIS — M86272 Subacute osteomyelitis, left ankle and foot: Secondary | ICD-10-CM | POA: Diagnosis not present

## 2018-04-05 DIAGNOSIS — R3914 Feeling of incomplete bladder emptying: Secondary | ICD-10-CM | POA: Diagnosis not present

## 2018-04-05 DIAGNOSIS — R3912 Poor urinary stream: Secondary | ICD-10-CM | POA: Diagnosis not present

## 2018-04-05 DIAGNOSIS — E1140 Type 2 diabetes mellitus with diabetic neuropathy, unspecified: Secondary | ICD-10-CM | POA: Diagnosis not present

## 2018-04-05 DIAGNOSIS — Z4781 Encounter for orthopedic aftercare following surgical amputation: Secondary | ICD-10-CM | POA: Diagnosis not present

## 2018-04-05 DIAGNOSIS — R350 Frequency of micturition: Secondary | ICD-10-CM | POA: Diagnosis not present

## 2018-04-05 DIAGNOSIS — I480 Paroxysmal atrial fibrillation: Secondary | ICD-10-CM | POA: Diagnosis not present

## 2018-04-05 DIAGNOSIS — I129 Hypertensive chronic kidney disease with stage 1 through stage 4 chronic kidney disease, or unspecified chronic kidney disease: Secondary | ICD-10-CM | POA: Diagnosis not present

## 2018-04-05 DIAGNOSIS — E1169 Type 2 diabetes mellitus with other specified complication: Secondary | ICD-10-CM | POA: Diagnosis not present

## 2018-04-05 NOTE — Telephone Encounter (Signed)
2nd attempt, left message to return the call.

## 2018-04-06 ENCOUNTER — Encounter (INDEPENDENT_AMBULATORY_CARE_PROVIDER_SITE_OTHER): Payer: Self-pay | Admitting: Orthopedic Surgery

## 2018-04-06 ENCOUNTER — Ambulatory Visit (INDEPENDENT_AMBULATORY_CARE_PROVIDER_SITE_OTHER): Payer: Medicare Other | Admitting: Orthopedic Surgery

## 2018-04-06 DIAGNOSIS — Z89432 Acquired absence of left foot: Secondary | ICD-10-CM

## 2018-04-06 NOTE — Telephone Encounter (Signed)
No phone call back, needs to be hospital f/u

## 2018-04-06 NOTE — Progress Notes (Signed)
Office Visit Note   Patient: Kevin [name]           Date of Birth: [birth year]           MRN: [id number] Visit Date: 04/06/2018              Requested by: [name], MD [address] [place], [place] [address] PCP: [name], MD  Chief Complaint  Patient presents with  . Left Foot - Routine Post Op      HPI: Patient is a 77 year old gentleman who presents 2 weeks status post left transmetatarsal amputation patient has no complaints.  Assessment & Plan: Visit Diagnoses:  1. History of transmetatarsal amputation of left foot ([place])     Plan: Continue protected weightbearing Dial soap cleansing.  Follow-up in 2 weeks to harvest the sutures.  Patient will work on heel cord stretching.   Follow-Up Instructions: Return in about 2 weeks (around 04/20/2018).   Ortho Exam  Patient is alert, oriented, no adenopathy, well-dressed, normal affect, normal respiratory effort.  Examination the incision is well approximated there is no redness no cellulitis no drainage no signs of infection.  Imaging: No results found. No images are attached to the encounter.  Labs: Lab Results  Component Value Date   HGBA1C 7.6 (H) 03/17/2018   REPTSTATUS 03/22/2018 FINAL 03/17/2018   GRAMSTAIN  03/17/2018    MODERATE WBC PRESENT, PREDOMINANTLY PMN MODERATE GRAM POSITIVE COCCI FEW GRAM NEGATIVE RODS    CULT  03/17/2018    MODERATE STREPTOCOCCUS GROUP C MODERATE PREVOTELLA MELANINOGENICA BETA LACTAMASE POSITIVE Performed at [facility], [place] [address], [place], [place] [address]    LABORGA STAPHYLOCOCCUS SPECIES (COAGULASE NEGATIVE) 03/17/2018     Lab Results  Component Value Date   ALBUMIN 2.3 (L) 03/28/2018   ALBUMIN 2.0 (L) 03/18/2018   ALBUMIN 2.6 (L) 03/17/2018    There is no height or weight on file to calculate BMI.  Orders:  No orders of the defined types were placed in this encounter.  No orders of the defined types were placed in  this encounter.    Procedures: No procedures performed  Clinical Data: No additional findings.  ROS:  All other systems negative, except as noted in the HPI. Review of Systems  Objective: Vital Signs: There were no vitals taken for this visit.  Specialty Comments:  No specialty comments available.  PMFS History: Patient Active Problem List   Diagnosis Date Noted  . Debility 03/31/2018  . Urinary retention with incomplete bladder emptying 03/31/2018  . History of transmetatarsal amputation of left foot ([place])   . 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 ([place])   . Diabetic ulcer of left midfoot associated with type 2 diabetes mellitus ([place])   . Status post transmetatarsal amputation of left foot ([place])   . Postoperative pain   . Diabetic ulcer of toe of left foot associated with type 2 diabetes mellitus ([place])   . Benign prostatic hyperplasia with urinary retention   . New onset atrial fibrillation ([place])   . Incontinence of feces   . Cerebrovascular accident (CVA) ([place])   . Benign essential HTN   . Diabetes mellitus type 2 in nonobese ([facility])   . Hypokalemia   . PAF (paroxysmal atrial fibrillation) ([place])   . Leukocytosis   . SIRS (systemic inflammatory response syndrome) ([facility])   . Acute blood loss anemia   . Stage 3 chronic kidney disease ([place])   . Essential hypertension 03/20/2018  . Tachycardia  03/20/2018  . Sepsis ([place]) 03/20/2018  . Diabetes ([place]) 03/20/2018  . Subacute osteomyelitis, left ankle and foot ([place]) 03/20/2018  . A-fib ([place]) 03/20/2018  . 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 Strain of left tibialis anterior muscle 12/19/2015  . 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 Fracture of radius, distal, left, closed 12/19/2012   Past Medical History:  Diagnosis Date  . Diabetes mellitus without complication ([place])   . Hypertension     Family History  Problem Relation Age of Onset  . Hypertension Father     Past Surgical  History:  Procedure Laterality Date  . AMPUTATION Left 03/24/2018   Procedure: Transmetatarsal Amputation Left Foot;  Surgeon: [name], MD;  Location: [facility];  Service: Orthopedics;  Laterality: Left;  . I&D EXTREMITY Left 03/17/2018   Procedure: IRRIGATION AND DEBRIDEMENT FOOT;  Surgeon: [name], MD;  Location: [facility];  Service: Orthopedics;  Laterality: Left;  . WRIST SURGERY     Social History   Occupational History  . Not on file  Tobacco Use  . Smoking status: Never Smoker  . Smokeless tobacco: Never Used  Substance and Sexual Activity  . Alcohol use: Yes  . Drug use: Never  . Sexual activity: Not on file

## 2018-04-07 DIAGNOSIS — E1140 Type 2 diabetes mellitus with diabetic neuropathy, unspecified: Secondary | ICD-10-CM | POA: Diagnosis not present

## 2018-04-07 DIAGNOSIS — Z4781 Encounter for orthopedic aftercare following surgical amputation: Secondary | ICD-10-CM | POA: Diagnosis not present

## 2018-04-07 DIAGNOSIS — I480 Paroxysmal atrial fibrillation: Secondary | ICD-10-CM | POA: Diagnosis not present

## 2018-04-07 DIAGNOSIS — I129 Hypertensive chronic kidney disease with stage 1 through stage 4 chronic kidney disease, or unspecified chronic kidney disease: Secondary | ICD-10-CM | POA: Diagnosis not present

## 2018-04-07 DIAGNOSIS — M86272 Subacute osteomyelitis, left ankle and foot: Secondary | ICD-10-CM | POA: Diagnosis not present

## 2018-04-07 DIAGNOSIS — E1169 Type 2 diabetes mellitus with other specified complication: Secondary | ICD-10-CM | POA: Diagnosis not present

## 2018-04-10 DIAGNOSIS — Z4781 Encounter for orthopedic aftercare following surgical amputation: Secondary | ICD-10-CM | POA: Diagnosis not present

## 2018-04-10 DIAGNOSIS — I129 Hypertensive chronic kidney disease with stage 1 through stage 4 chronic kidney disease, or unspecified chronic kidney disease: Secondary | ICD-10-CM | POA: Diagnosis not present

## 2018-04-10 DIAGNOSIS — E1169 Type 2 diabetes mellitus with other specified complication: Secondary | ICD-10-CM | POA: Diagnosis not present

## 2018-04-10 DIAGNOSIS — I480 Paroxysmal atrial fibrillation: Secondary | ICD-10-CM | POA: Diagnosis not present

## 2018-04-10 DIAGNOSIS — E1140 Type 2 diabetes mellitus with diabetic neuropathy, unspecified: Secondary | ICD-10-CM | POA: Diagnosis not present

## 2018-04-10 DIAGNOSIS — M86272 Subacute osteomyelitis, left ankle and foot: Secondary | ICD-10-CM | POA: Diagnosis not present

## 2018-04-11 DIAGNOSIS — Z89432 Acquired absence of left foot: Secondary | ICD-10-CM | POA: Diagnosis not present

## 2018-04-11 DIAGNOSIS — I10 Essential (primary) hypertension: Secondary | ICD-10-CM | POA: Diagnosis not present

## 2018-04-11 DIAGNOSIS — E7800 Pure hypercholesterolemia, unspecified: Secondary | ICD-10-CM | POA: Diagnosis not present

## 2018-04-11 DIAGNOSIS — N400 Enlarged prostate without lower urinary tract symptoms: Secondary | ICD-10-CM | POA: Diagnosis not present

## 2018-04-11 DIAGNOSIS — K5190 Ulcerative colitis, unspecified, without complications: Secondary | ICD-10-CM | POA: Diagnosis not present

## 2018-04-11 DIAGNOSIS — Z7984 Long term (current) use of oral hypoglycemic drugs: Secondary | ICD-10-CM | POA: Diagnosis not present

## 2018-04-11 DIAGNOSIS — R2990 Unspecified symptoms and signs involving the nervous system: Secondary | ICD-10-CM | POA: Diagnosis not present

## 2018-04-11 DIAGNOSIS — I4891 Unspecified atrial fibrillation: Secondary | ICD-10-CM | POA: Diagnosis not present

## 2018-04-11 DIAGNOSIS — E119 Type 2 diabetes mellitus without complications: Secondary | ICD-10-CM | POA: Diagnosis not present

## 2018-04-11 DIAGNOSIS — M86172 Other acute osteomyelitis, left ankle and foot: Secondary | ICD-10-CM | POA: Diagnosis not present

## 2018-04-11 DIAGNOSIS — F322 Major depressive disorder, single episode, severe without psychotic features: Secondary | ICD-10-CM | POA: Diagnosis not present

## 2018-04-12 ENCOUNTER — Encounter: Payer: Self-pay | Admitting: Registered Nurse

## 2018-04-12 ENCOUNTER — Telehealth: Payer: Self-pay | Admitting: Cardiology

## 2018-04-12 ENCOUNTER — Encounter: Payer: Self-pay | Admitting: Cardiology

## 2018-04-12 ENCOUNTER — Encounter: Payer: Medicare Other | Attending: Registered Nurse | Admitting: Registered Nurse

## 2018-04-12 VITALS — BP 127/79 | HR 83 | Resp 14 | Ht 73.0 in | Wt 218.0 lb

## 2018-04-12 DIAGNOSIS — I480 Paroxysmal atrial fibrillation: Secondary | ICD-10-CM | POA: Insufficient documentation

## 2018-04-12 DIAGNOSIS — Z7901 Long term (current) use of anticoagulants: Secondary | ICD-10-CM | POA: Diagnosis not present

## 2018-04-12 DIAGNOSIS — Z8249 Family history of ischemic heart disease and other diseases of the circulatory system: Secondary | ICD-10-CM | POA: Diagnosis not present

## 2018-04-12 DIAGNOSIS — I639 Cerebral infarction, unspecified: Secondary | ICD-10-CM

## 2018-04-12 DIAGNOSIS — E119 Type 2 diabetes mellitus without complications: Secondary | ICD-10-CM | POA: Diagnosis not present

## 2018-04-12 DIAGNOSIS — R5381 Other malaise: Secondary | ICD-10-CM | POA: Diagnosis not present

## 2018-04-12 DIAGNOSIS — I10 Essential (primary) hypertension: Secondary | ICD-10-CM | POA: Insufficient documentation

## 2018-04-12 DIAGNOSIS — Z89432 Acquired absence of left foot: Secondary | ICD-10-CM | POA: Diagnosis not present

## 2018-04-12 DIAGNOSIS — R2990 Unspecified symptoms and signs involving the nervous system: Secondary | ICD-10-CM

## 2018-04-12 DIAGNOSIS — R339 Retention of urine, unspecified: Secondary | ICD-10-CM | POA: Diagnosis not present

## 2018-04-12 NOTE — Telephone Encounter (Signed)
Spoke with wife and scheduled pt for post hosp on 8/1.  Wife appreciative for call.

## 2018-04-12 NOTE — Telephone Encounter (Signed)
New Message:       Pt is calling and wants to know if they could possibly still get in on 04/27/18 due to it being a post hosp f/u. Pt's wife states he can not wait until 05/23/18 for an appt

## 2018-04-12 NOTE — Progress Notes (Signed)
Subjective:    Patient ID: Kevin [name], male    DOB: 01/18/1941, 77 y.o.   MRN: [id number]  HPI: [name] is a 77 year old male who is here for transitional care visit in follow up of his status post transmetatarsal amputation of left foot, debility, type 2 DM, hypertension, PAF, stroke like episode s/p IV TPA and urinary retention with incomplete bladder emptying. He has been home, his home health therapies are on hold due to non weight bearing at this time. He denies any pain. Also reports a good appetite.   [name] continues with in and out caths 4 times a day, this morning he emptied 800 cc, educating given with the goal to keep bladder volumes below 400 cc, he verbalizes understanding. He has an appointment scheduled for urologist.   Transmetatarsal sutures are intact with dark skin edges across incision line, area cleansed and new sock appiled.   Wife in room all questions answered.   Pain Inventory Average Pain 0 Pain Right Now 0 My pain is no pain  In the last 24 hours, has pain interfered with the following? General activity 0 Relation with others 0 Enjoyment of life 0 What TIME of day is your pain at its worst? no pain Sleep (in general) Fair  Pain is worse with: no pain Pain improves with: no pain Relief from Meds: no pain  Mobility use a wheelchair transfers alone  Function not employed: date last employed .  Neuro/Psych bladder control problems trouble walking  Prior Studies hospital f/u  Physicians involved in your care hospital f/u   Family History  Problem Relation Age of Onset  . Hypertension Father    Social History   Socioeconomic History  . Marital status: Married    Spouse name: Not on file  . Number of children: Not on file  . Years of education: Not on file  . Highest education level: Not on file  Occupational History  . Not on file  Social Needs  . Financial resource strain: Not on file  . Food  insecurity:    Worry: Not on file    Inability: Not on file  . Transportation needs:    Medical: Not on file    Non-medical: Not on file  Tobacco Use  . Smoking status: Never Smoker  . Smokeless tobacco: Never Used  Substance and Sexual Activity  . Alcohol use: Yes  . Drug use: Never  . Sexual activity: Not on file  Lifestyle  . Physical activity:    Days per week: Not on file    Minutes per session: Not on file  . Stress: Not on file  Relationships  . Social connections:    Talks on phone: Not on file    Gets together: Not on file    Attends religious service: Not on file    Active member of club or organization: Not on file    Attends meetings of clubs or organizations: Not on file    Relationship status: Not on file  Other Topics Concern  . Not on file  Social History Narrative  . Not on file   Past Surgical History:  Procedure Laterality Date  . AMPUTATION Left 03/24/2018   Procedure: Transmetatarsal Amputation Left Foot;  Surgeon: [name], MD;  Location: [facility];  Service: Orthopedics;  Laterality: Left;  . I&D EXTREMITY Left 03/17/2018   Procedure: IRRIGATION AND DEBRIDEMENT FOOT;  Surgeon: [name], MD;  Location: [facility]  OR;  Service: Orthopedics;  Laterality: Left;  . WRIST SURGERY     Past Medical History:  Diagnosis Date  . Diabetes mellitus without complication ([place])   . Hypertension    BP 127/79 (BP Location: Right Arm, Patient Position: Sitting, Cuff Size: Normal)   Pulse 83   Resp 14   Ht 6\' 1"  (1.854 m) Comment: patient reported  Wt 218 lb (98.9 kg) Comment: patient reported  SpO2 93%   BMI 28.76 kg/m   Opioid Risk Score:   Fall Risk Score:  `1  Depression screen PHQ 2/9  Depression screen PHQ 2/9 04/12/2018  Decreased Interest 0  Down, Depressed, Hopeless 0  PHQ - 2 Score 0  Altered sleeping 1  Tired, decreased energy 1  Change in appetite 0  Feeling bad or failure about yourself  0  Trouble concentrating 0  Moving slowly  or fidgety/restless 0  Suicidal thoughts 0  PHQ-9 Score 2  Difficult doing work/chores Somewhat difficult    Review of Systems  Constitutional: Negative.   HENT: Negative.   Eyes: Negative.   Respiratory: Positive for shortness of breath.   Cardiovascular: Negative.   Gastrointestinal: Negative.   Endocrine: Negative.   Genitourinary: Positive for difficulty urinating and frequency.  Musculoskeletal: Positive for gait problem.  Skin: Negative.   Allergic/Immunologic: Negative.   Hematological: Negative.   Psychiatric/Behavioral: Negative.        Objective:   Physical Exam  Constitutional: He is oriented to person, place, and time. He appears well-developed and well-nourished.  HENT:  Head: Normocephalic and atraumatic.  Neck: Normal range of motion. Neck supple.  Cardiovascular: Normal rate and regular rhythm.  Pulmonary/Chest: Effort normal and breath sounds normal.  Musculoskeletal:  Normal Muscle Bulk and Muscle Testing Reveals: Upper Extremities: Full ROM and Muscle Strength 5/5 Lower Extremities: Right Full ROM and Muscle Strength 5/5 Left: Transmetatarsal site: Transmetatarsal sutures are intact with dark skin edges across incision line. Arrived in wheelchair  Neurological: He is alert and oriented to person, place, and time.  Skin: Skin is warm and dry.  Nursing note and vitals reviewed.         Assessment & Plan:  1. Stroke like episode s/p IV TPA/ Debility: Home Health Therapies on hold at this time due to Non weight bearing.  2. Status post TMA: [name] Given Following. 3, HTN: Continue current medication regimen. PCP Following.  4. PAF: Continue Eliquis: Cardiology Following. 5. Type 2 DM: Continue current medication regimen. PCP Following.  6. Urinary Retention with incomplete bladder emptying. Continue with in and out caths four times a day, educated on keeping bladder volumes below 400 cc. He verbalizes understanding.   30 minutes of face to face patient  care time was spent during this visit. All questions were encouraged and answered.  F/U with Dr Posey Pronto in 4-6 weeks

## 2018-04-13 DIAGNOSIS — I480 Paroxysmal atrial fibrillation: Secondary | ICD-10-CM | POA: Diagnosis not present

## 2018-04-13 DIAGNOSIS — I129 Hypertensive chronic kidney disease with stage 1 through stage 4 chronic kidney disease, or unspecified chronic kidney disease: Secondary | ICD-10-CM | POA: Diagnosis not present

## 2018-04-13 DIAGNOSIS — M86272 Subacute osteomyelitis, left ankle and foot: Secondary | ICD-10-CM | POA: Diagnosis not present

## 2018-04-13 DIAGNOSIS — E1169 Type 2 diabetes mellitus with other specified complication: Secondary | ICD-10-CM | POA: Diagnosis not present

## 2018-04-13 DIAGNOSIS — E1140 Type 2 diabetes mellitus with diabetic neuropathy, unspecified: Secondary | ICD-10-CM | POA: Diagnosis not present

## 2018-04-13 DIAGNOSIS — Z4781 Encounter for orthopedic aftercare following surgical amputation: Secondary | ICD-10-CM | POA: Diagnosis not present

## 2018-04-18 DIAGNOSIS — Z4781 Encounter for orthopedic aftercare following surgical amputation: Secondary | ICD-10-CM | POA: Diagnosis not present

## 2018-04-18 DIAGNOSIS — I129 Hypertensive chronic kidney disease with stage 1 through stage 4 chronic kidney disease, or unspecified chronic kidney disease: Secondary | ICD-10-CM | POA: Diagnosis not present

## 2018-04-18 DIAGNOSIS — E1169 Type 2 diabetes mellitus with other specified complication: Secondary | ICD-10-CM | POA: Diagnosis not present

## 2018-04-18 DIAGNOSIS — I480 Paroxysmal atrial fibrillation: Secondary | ICD-10-CM | POA: Diagnosis not present

## 2018-04-18 DIAGNOSIS — M86272 Subacute osteomyelitis, left ankle and foot: Secondary | ICD-10-CM | POA: Diagnosis not present

## 2018-04-18 DIAGNOSIS — E1140 Type 2 diabetes mellitus with diabetic neuropathy, unspecified: Secondary | ICD-10-CM | POA: Diagnosis not present

## 2018-04-19 ENCOUNTER — Encounter (INDEPENDENT_AMBULATORY_CARE_PROVIDER_SITE_OTHER): Payer: Self-pay | Admitting: Family

## 2018-04-19 ENCOUNTER — Ambulatory Visit (INDEPENDENT_AMBULATORY_CARE_PROVIDER_SITE_OTHER): Payer: Medicare Other | Admitting: Family

## 2018-04-19 VITALS — Ht 73.0 in | Wt 218.0 lb

## 2018-04-19 DIAGNOSIS — M86272 Subacute osteomyelitis, left ankle and foot: Secondary | ICD-10-CM

## 2018-04-19 DIAGNOSIS — Z89432 Acquired absence of left foot: Secondary | ICD-10-CM

## 2018-04-19 NOTE — Progress Notes (Signed)
Office Visit Note   Patient: Kevin [name]           Date of Birth: 09/30/1940           MRN: [id number] Visit Date: 04/19/2018              Requested by: [name], MD [address] [place], [place] [address] PCP: [name], MD  Chief Complaint  Patient presents with  . Left Foot - Follow-up      HPI: Patient is a 77 year old gentleman who presents nearly 5 weeks status post left transmetatarsal amputation. patient has no complaints.  Assessment & Plan: Visit Diagnoses:  No diagnosis found.  Plan: Continue protected weightbearing, with darco shoe. Dial soap cleansing.  Vive compression stocking. mupriocin to eschar. Follow-up in 2 weeks.  Patient will work on heel cord stretching.   Follow-Up Instructions: No follow-ups on file.   Ortho Exam  Patient is alert, oriented, no adenopathy, well-dressed, normal affect, normal respiratory effort.  Examination the incision is healing well. Sutures in place. There is a 2 cm length with eschar, ischemic changes. No redness no cellulitis no drainage no signs of infection.  Imaging: No results found. No images are attached to the encounter.  Labs: Lab Results  Component Value Date   HGBA1C 7.6 (H) 03/17/2018   REPTSTATUS 03/22/2018 FINAL 03/17/2018   GRAMSTAIN  03/17/2018    MODERATE WBC PRESENT, PREDOMINANTLY PMN MODERATE GRAM POSITIVE COCCI FEW GRAM NEGATIVE RODS    CULT  03/17/2018    MODERATE STREPTOCOCCUS GROUP C MODERATE PREVOTELLA MELANINOGENICA BETA LACTAMASE POSITIVE Performed at [facility], [place] [address] [place], [place] [address]    LABORGA STAPHYLOCOCCUS SPECIES (COAGULASE NEGATIVE) 03/17/2018     Lab Results  Component Value Date   ALBUMIN 2.3 (L) 03/28/2018   ALBUMIN 2.0 (L) 03/18/2018   ALBUMIN 2.6 (L) 03/17/2018    Body mass index is 28.76 kg/m.  Orders:  No orders of the defined types were placed in this encounter.  No orders of the defined types  were placed in this encounter.    Procedures: No procedures performed  Clinical Data: No additional findings.  ROS:  All other systems negative, except as noted in the HPI. Review of Systems  Constitutional: Negative for chills and fever.    Objective: Vital Signs: Ht 6\' 1"  (1.854 m)   Wt 218 lb (98.9 kg)   BMI 28.76 kg/m   Specialty Comments:  No specialty comments available.  PMFS History: Patient Active Problem List   Diagnosis Date Noted  . Debility 03/31/2018  . Urinary retention with incomplete bladder emptying 03/31/2018  . History of transmetatarsal amputation of left foot ([place])   . 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 ([place])   . Diabetic ulcer of left midfoot associated with type 2 diabetes mellitus ([place])   . Status post transmetatarsal amputation of left foot ([place])   . Postoperative pain   . Diabetic ulcer of toe of left foot associated with type 2 diabetes mellitus ([place])   . Benign prostatic hyperplasia with urinary retention   . New onset atrial fibrillation ([place])   . Incontinence of feces   . Cerebrovascular accident (CVA) ([place])   . Benign essential HTN   . Diabetes mellitus type 2 in nonobese (H[facility])   . Hypokalemia   . PAF (paroxysmal atrial fibrillation) ([place])   . Leukocytosis   . SIRS (systemic inflammatory response syndrome) (H[facility])   . Acute blood loss anemia   .  Stage 3 chronic kidney disease ([place])   . Essential hypertension 03/20/2018  . Tachycardia 03/20/2018  . Sepsis ([place]) 03/20/2018  . Diabetes ([place]) 03/20/2018  . Subacute osteomyelitis, left ankle and foot ([place]) 03/20/2018  . A-fib ([place]) 03/20/2018  . 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 Strain of left tibialis anterior muscle 12/19/2015  . 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 Fracture of radius, distal, left, closed 12/19/2012   Past Medical History:  Diagnosis Date  . Diabetes mellitus without complication ([place])   . Hypertension     Family  History  Problem Relation Age of Onset  . Hypertension Father     Past Surgical History:  Procedure Laterality Date  . AMPUTATION Left 03/24/2018   Procedure: Transmetatarsal Amputation Left Foot;  Surgeon: [name], MD;  Location: [facility];  Service: Orthopedics;  Laterality: Left;  . I&D EXTREMITY Left 03/17/2018   Procedure: IRRIGATION AND DEBRIDEMENT FOOT;  Surgeon: [name], MD;  Location: [facility];  Service: Orthopedics;  Laterality: Left;  . WRIST SURGERY     Social History   Occupational History  . Not on file  Tobacco Use  . Smoking status: Never Smoker  . Smokeless tobacco: Never Used  Substance and Sexual Activity  . Alcohol use: Yes  . Drug use: Never  . Sexual activity: Not on file

## 2018-04-21 DIAGNOSIS — M86272 Subacute osteomyelitis, left ankle and foot: Secondary | ICD-10-CM | POA: Diagnosis not present

## 2018-04-21 DIAGNOSIS — I480 Paroxysmal atrial fibrillation: Secondary | ICD-10-CM | POA: Diagnosis not present

## 2018-04-21 DIAGNOSIS — E1140 Type 2 diabetes mellitus with diabetic neuropathy, unspecified: Secondary | ICD-10-CM | POA: Diagnosis not present

## 2018-04-21 DIAGNOSIS — E1169 Type 2 diabetes mellitus with other specified complication: Secondary | ICD-10-CM | POA: Diagnosis not present

## 2018-04-21 DIAGNOSIS — Z4781 Encounter for orthopedic aftercare following surgical amputation: Secondary | ICD-10-CM | POA: Diagnosis not present

## 2018-04-21 DIAGNOSIS — I129 Hypertensive chronic kidney disease with stage 1 through stage 4 chronic kidney disease, or unspecified chronic kidney disease: Secondary | ICD-10-CM | POA: Diagnosis not present

## 2018-04-24 DIAGNOSIS — I129 Hypertensive chronic kidney disease with stage 1 through stage 4 chronic kidney disease, or unspecified chronic kidney disease: Secondary | ICD-10-CM | POA: Diagnosis not present

## 2018-04-24 DIAGNOSIS — E1169 Type 2 diabetes mellitus with other specified complication: Secondary | ICD-10-CM | POA: Diagnosis not present

## 2018-04-24 DIAGNOSIS — I480 Paroxysmal atrial fibrillation: Secondary | ICD-10-CM | POA: Diagnosis not present

## 2018-04-24 DIAGNOSIS — M86272 Subacute osteomyelitis, left ankle and foot: Secondary | ICD-10-CM | POA: Diagnosis not present

## 2018-04-24 DIAGNOSIS — E1140 Type 2 diabetes mellitus with diabetic neuropathy, unspecified: Secondary | ICD-10-CM | POA: Diagnosis not present

## 2018-04-24 DIAGNOSIS — Z4781 Encounter for orthopedic aftercare following surgical amputation: Secondary | ICD-10-CM | POA: Diagnosis not present

## 2018-04-25 ENCOUNTER — Encounter: Payer: Self-pay | Admitting: Physician Assistant

## 2018-04-25 NOTE — H&P (View-Only) (Signed)
Cardiology Office Note    Date:  04/27/2018  ID:  Kevin [name], DOB Dec 05, 1940, MRN [id number] PCP:  [name], MD  Cardiologist:  Lauree Chandler, MD   Chief Complaint: f/u afib  History of Present Illness:  Kevin [name] is a 77 y.o. 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 who presents for post-hospital follow-up. Earlier in July 2019 he was admitted with left-sided weakness left facial droop and expressive deficits.  CT of head was negative and he received TPA. Hospitalization notable for oseteomyelitis of foot s/p I&D of left foot ulcer and ultimately left transmetatarsal amputation. Cardiology followed for initially PACs/PVCS then new onset of atrial fibrillation. He was started on Eliquis. Other issues noted during admission include hypoalbuminemia with protein-calorie malnutrition, urinary retention, AKI on probable CKD, anemia. Last labs during admission include WBC 14.9, Hgb 9.7, plt 485, K 4.5, Cr 1.23, albumin 2.3, AST/ALT OK, troponin peak 0.22, LDL 39. 2D echo 03/17/18 showed EF 60-65%, mod LAE.  He is here with his wife who was a retired [employer] scientist. They are from [place]. In general he's been feeling better but notices with recent events, his stamina is overall down with respect to SOB while walking. No edema, chest pain, awareness of AF. He has been compliant with his cardiac meds including Eliquis. Pulse ox 93% in clinic today, HR 101. It appears HR was in the 80s on DC recently. Pulse ox was 93-97% in the hospital. It was 93% during physical med OV 04/12/18 as well.   Past Medical History:  Diagnosis Date  . CKD (chronic kidney disease), stage III ([place])   . Diabetes mellitus type 2 in nonobese (HCC)   . Former tobacco use   . Hypertension   . Osteomyelitis ([place])    a. 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  [name] (paroxysmal atrial fibrillation)  ([place])    a. dx 03/2018 in setting of stroke, osteomyelitis.  . Stroke ([place]) 03/2018    Past Surgical History:  Procedure Laterality Date  . AMPUTATION Left 03/24/2018   Procedure: Transmetatarsal Amputation Left Foot;  Surgeon: [name], MD;  Location: [facility];  Service: Orthopedics;  Laterality: Left;  . I&D EXTREMITY Left 03/17/2018   Procedure: IRRIGATION AND DEBRIDEMENT FOOT;  Surgeon: [name], MD;  Location: [facility];  Service: Orthopedics;  Laterality: Left;  . WRIST SURGERY      Current Medications: Current Meds  Medication Sig  . apixaban (ELIQUIS) 5 MG TABS tablet Take 1 tablet (5 mg total) by mouth 2 (two) times daily.  . bethanechol (URECHOLINE) 25 MG tablet Take 1 tablet (25 mg total) by mouth 4 (four) times daily.  Marland Kitchen diltiazem (CARDIZEM CD) 240 MG 24 hr capsule Take 1 capsule (240 mg total) by mouth daily.  Marland Kitchen FLUoxetine (PROZAC) 20 MG capsule Take 20 mg by mouth daily.  Marland Kitchen glimepiride (AMARYL) 4 MG tablet Take 4 mg by mouth daily with breakfast.  . mesalamine (APRISO) 0.375 g 24 hr capsule Take 750 mg by mouth 2 (two) times daily.   . metFORMIN (GLUCOPHAGE) 500 MG tablet Take 1,000 mg by mouth 2 (two) times daily with a meal.   . metoprolol succinate (TOPROL-XL) 100 MG 24 hr tablet Take 1.5 pills daily following a meal.  . Multiple Vitamin (MULTIVITAMIN WITH MINERALS) TABS tablet Take 1 tablet by mouth daily.  . rosuvastatin (CRESTOR) 10 MG tablet Take 0.5 tablets (5  mg total) by mouth daily.  . sitaGLIPtin (JANUVIA) 100 MG tablet Take 100 mg by mouth daily.  . tamsulosin (FLOMAX) 0.4 MG CAPS capsule Take 1 capsule (0.4 mg total) by mouth 2 times daily at 12 noon and 4 pm.  . [DISCONTINUED] famotidine (PEPCID) 20 MG tablet Take 1 tablet (20 mg total) by mouth 2 (two) times daily.  . [DISCONTINUED] glimepiride (AMARYL) 1 MG tablet Take 1 tablet (1 mg total) by mouth daily with breakfast.  . [DISCONTINUED] glimepiride (AMARYL) 4 MG tablet Take 2 mg by mouth daily  with breakfast.      Allergies:   Sulfa antibiotics   Social History   Socioeconomic History  . Marital status: Married    Spouse name: Not on file  . Number of children: Not on file  . Years of education: Not on file  . Highest education level: Not on file  Occupational History  . Not on file  Social Needs  . Financial resource strain: Not on file  . Food insecurity:    Worry: Not on file    Inability: Not on file  . Transportation needs:    Medical: Not on file    Non-medical: Not on file  Tobacco Use  . Smoking status: Never Smoker  . Smokeless tobacco: Never Used  Substance and Sexual Activity  . Alcohol use: Yes  . Drug use: Never  . Sexual activity: Not on file  Lifestyle  . Physical activity:    Days per week: Not on file    Minutes per session: Not on file  . Stress: Not on file  Relationships  . Social connections:    Talks on phone: Not on file    Gets together: Not on file    Attends religious service: Not on file    Active member of club or organization: Not on file    Attends meetings of clubs or organizations: Not on file    Relationship status: Not on file  Other Topics Concern  . Not on file  Social History Narrative  . Not on file     Family History:  The patient's family history includes Hypertension in his father.  ROS:   Please see the history of present illness. Denies bleeding. All other systems are reviewed and otherwise negative.    PHYSICAL EXAM:   VS:  BP 110/60   Pulse (!) 101   Ht 6\' 1"  (1.854 m)   Wt 212 lb 6.4 oz (96.3 kg)   SpO2 93%   BMI 28.02 kg/m   BMI: Body mass index is 28.02 kg/m. GEN: Well nourished, well developed WM, in no acute distress HEENT: normocephalic, atraumatic Neck: no JVD, carotid bruits, or masses Cardiac: irregularly irregular, mildly elevated rate; no murmurs, rubs, or gallops, no edema  RLE. S/p L forefoot amputation, wrapped in boot Respiratory: diminished bilaterally but otherwise clear to  auscultation without wheezes/rales/rhonchi, normal work of breathing GI: soft, nontender, nondistended, + BS MS: no deformity or atrophy Skin: warm and dry, no rash Neuro:  Alert and Oriented x 3, Strength and sensation are intact, follows commands Psych: euthymic mood, full affect  Wt Readings from Last 3 Encounters:  04/27/18 212 lb 6.4 oz (96.3 kg)  04/19/18 218 lb (98.9 kg)  04/12/18 218 lb (98.9 kg)      Studies/Labs Reviewed:   EKG:  EKG was ordered today and personally reviewed by me and demonstrates atrial fib 101bpm, nonspecific ST-T changes. RBBB resolved.  Recent Labs: 03/17/2018: B  Natriuretic Peptide 358.5 03/19/2018: Magnesium 1.9 03/28/2018: ALT 18; BUN 9; Creatinine, Ser 1.24; Potassium 4.5; Sodium 138 03/31/2018: Hemoglobin 9.7; Platelets 485   Lipid Panel    Component Value Date/Time   CHOL 75 03/17/2018 0339   TRIG 114 03/17/2018 0339   HDL 13 (L) 03/17/2018 0339   CHOLHDL 5.8 03/17/2018 0339   VLDL 23 03/17/2018 0339   LDLCALC 39 03/17/2018 0339    Additional studies/ records that were reviewed today include: Summarized above  ASSESSMENT & PLAN:   1. Persistent atrial fib - Dr .Tamala Julian had suggested on his last rounding note to consider setting up electrical DCCV if patient was still in atrial fib, which is the case. I do suspect he may have had atrial fib prior to his recent admission considering that he had a stroke, but given relatively asymptomatic nature, it's not really clear. He is now on AVN blocking agents so it is reasonable to proceed with attempt at restoration of NSR. Dr. Tamala Julian also suggested titrating Toprol to 200mg  daily if further rate control needed. HR remains borderline elevated and he continues to have some dyspnea on exertion. Will increase Toprol and continue diltiazem and Eliquis at present doses. The patient would like to proceed with DCCV - we have no availability on his initial chosen date of Tuesday but we do on Friday. Risks/benefits  reviewed and he agrees to proceed. Discussed importance of no missed anticoag doses. He will hold DM meds AM of procedure. If he returns to atrial fib post DCCV will need to discuss rate vs rhythm control with primary cardiologist contingent on how he is feeling. F/u CBC/BMET today and check thyroid.  2. Elevated troponin - no specific ischemic workup recommended in the hospital. Mildly elevated, possibly demand ischemia. LVEF normal. If dyspnea does not resolve with gradual increase in activity and restoration of NSR, consider ischemic evaluation. He has not had any angina and up until 2 months ago was exercising regularly. 3. Shortness of breath - does not occur at rest, only noted with exertion. Likely multifactorial in setting of recent hospitalization and continued AF. See above. Will also f/u 2V CXR today given that O2 sat is borderline, along with BNP. He has no signs of DVT on exam and has been fully anticoagulated so PE felt less likely. His pulse ox was actually hovering 93% at times in the hospital as well so this may require further pulm eval if it does not improve with above measures. 4. Essential HTN - follow BP with med titration. Overall controlled.  Disposition: F/u with Dr. Juanda Crumble 2 weeks after DCCV.   Medication Adjustments/Labs and Tests Ordered: Current medicines are reviewed at length with the patient today.  Concerns regarding medicines are outlined above. Medication changes, Labs and Tests ordered today are summarized above and listed in the Patient Instructions accessible in Encounters.   Signed, Charlie Pitter, PA-C  04/27/2018 11:55 AM    [facility] [address], [place], [place]  [address] Phone: [phone]; Fax: [phone]

## 2018-04-25 NOTE — Progress Notes (Addendum)
Cardiology Office Note    Date:  04/27/2018  ID:  [name], DOB 1941/09/01, MRN [id number] PCP:  [name], MD  Cardiologist:  Lauree Chandler, MD   Chief Complaint: f/u afib  History of Present Illness:  Kevin [name] is a 77 y.o. 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 who presents for post-hospital follow-up. Earlier in July 2019 he was admitted with left-sided weakness left facial droop and expressive deficits.  CT of head was negative and he received TPA. Hospitalization notable for oseteomyelitis of foot s/p I&D of left foot ulcer and ultimately left transmetatarsal amputation. Cardiology followed for initially PACs/PVCS then new onset of atrial fibrillation. He was started on Eliquis. Other issues noted during admission include hypoalbuminemia with protein-calorie malnutrition, urinary retention, AKI on probable CKD, anemia. Last labs during admission include WBC 14.9, Hgb 9.7, plt 485, K 4.5, Cr 1.23, albumin 2.3, AST/ALT OK, troponin peak 0.22, LDL 39. 2D echo 03/17/18 showed EF 60-65%, mod LAE.  He is here with his wife who was a retired [employer] scientist. They are from [place]. In general he's been feeling better but notices with recent events, his stamina is overall down with respect to SOB while walking. No edema, chest pain, awareness of AF. He has been compliant with his cardiac meds including Eliquis. Pulse ox 93% in clinic today, HR 101. It appears HR was in the 80s on DC recently. Pulse ox was 93-97% in the hospital. It was 93% during physical med OV 04/12/18 as well.   Past Medical History:  Diagnosis Date  . CKD (chronic kidney disease), stage III ([facility])   . Diabetes mellitus type 2 in nonobese (HCC)   . Former tobacco use   . Hypertension   . Osteomyelitis ([place])    a. 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  [name] (paroxysmal atrial fibrillation)  ([facility])    a. dx 03/2018 in setting of stroke, osteomyelitis.  . Stroke ([facility]) 03/2018    Past Surgical History:  Procedure Laterality Date  . AMPUTATION Left 03/24/2018   Procedure: Transmetatarsal Amputation Left Foot;  Surgeon: [name], MD;  Location: [facility];  Service: Orthopedics;  Laterality: Left;  . I&D EXTREMITY Left 03/17/2018   Procedure: IRRIGATION AND DEBRIDEMENT FOOT;  Surgeon: [name], MD;  Location: [facility];  Service: Orthopedics;  Laterality: Left;  . WRIST SURGERY      Current Medications: Current Meds  Medication Sig  . apixaban (ELIQUIS) 5 MG TABS tablet Take 1 tablet (5 mg total) by mouth 2 (two) times daily.  . bethanechol (URECHOLINE) 25 MG tablet Take 1 tablet (25 mg total) by mouth 4 (four) times daily.  Marland Kitchen diltiazem (CARDIZEM CD) 240 MG 24 hr capsule Take 1 capsule (240 mg total) by mouth daily.  Marland Kitchen FLUoxetine (PROZAC) 20 MG capsule Take 20 mg by mouth daily.  Marland Kitchen glimepiride (AMARYL) 4 MG tablet Take 4 mg by mouth daily with breakfast.  . mesalamine (APRISO) 0.375 g 24 hr capsule Take 750 mg by mouth 2 (two) times daily.   . metFORMIN (GLUCOPHAGE) 500 MG tablet Take 1,000 mg by mouth 2 (two) times daily with a meal.   . metoprolol succinate (TOPROL-XL) 100 MG 24 hr tablet Take 1.5 pills daily following a meal.  . Multiple Vitamin (MULTIVITAMIN WITH MINERALS) TABS tablet Take 1 tablet by mouth daily.  . rosuvastatin (CRESTOR) 10 MG tablet Take 0.5 tablets (5  mg total) by mouth daily.  . sitaGLIPtin (JANUVIA) 100 MG tablet Take 100 mg by mouth daily.  . tamsulosin (FLOMAX) 0.4 MG CAPS capsule Take 1 capsule (0.4 mg total) by mouth 2 times daily at 12 noon and 4 pm.  . [DISCONTINUED] famotidine (PEPCID) 20 MG tablet Take 1 tablet (20 mg total) by mouth 2 (two) times daily.  . [DISCONTINUED] glimepiride (AMARYL) 1 MG tablet Take 1 tablet (1 mg total) by mouth daily with breakfast.  . [DISCONTINUED] glimepiride (AMARYL) 4 MG tablet Take 2 mg by mouth daily  with breakfast.      Allergies:   Sulfa antibiotics   Social History   Socioeconomic History  . Marital status: Married    Spouse name: Not on file  . Number of children: Not on file  . Years of education: Not on file  . Highest education level: Not on file  Occupational History  . Not on file  Social Needs  . Financial resource strain: Not on file  . Food insecurity:    Worry: Not on file    Inability: Not on file  . Transportation needs:    Medical: Not on file    Non-medical: Not on file  Tobacco Use  . Smoking status: Never Smoker  . Smokeless tobacco: Never Used  Substance and Sexual Activity  . Alcohol use: Yes  . Drug use: Never  . Sexual activity: Not on file  Lifestyle  . Physical activity:    Days per week: Not on file    Minutes per session: Not on file  . Stress: Not on file  Relationships  . Social connections:    Talks on phone: Not on file    Gets together: Not on file    Attends religious service: Not on file    Active member of club or organization: Not on file    Attends meetings of clubs or organizations: Not on file    Relationship status: Not on file  Other Topics Concern  . Not on file  Social History Narrative  . Not on file     Family History:  The patient's family history includes Hypertension in his father.  ROS:   Please see the history of present illness. Denies bleeding. All other systems are reviewed and otherwise negative.    PHYSICAL EXAM:   VS:  BP 110/60   Pulse (!) 101   Ht 6\' 1"  (1.854 m)   Wt 212 lb 6.4 oz (96.3 kg)   SpO2 93%   BMI 28.02 kg/m   BMI: Body mass index is 28.02 kg/m. GEN: Well nourished, well developed WM, in no acute distress HEENT: normocephalic, atraumatic Neck: no JVD, carotid bruits, or masses Cardiac: irregularly irregular, mildly elevated rate; no murmurs, rubs, or gallops, no edema  RLE. S/p L forefoot amputation, wrapped in boot Respiratory: diminished bilaterally but otherwise clear to  auscultation without wheezes/rales/rhonchi, normal work of breathing GI: soft, nontender, nondistended, + BS MS: no deformity or atrophy Skin: warm and dry, no rash Neuro:  Alert and Oriented x 3, Strength and sensation are intact, follows commands Psych: euthymic mood, full affect  Wt Readings from Last 3 Encounters:  04/27/18 212 lb 6.4 oz (96.3 kg)  04/19/18 218 lb (98.9 kg)  04/12/18 218 lb (98.9 kg)      Studies/Labs Reviewed:   EKG:  EKG was ordered today and personally reviewed by me and demonstrates atrial fib 101bpm, nonspecific ST-T changes. RBBB resolved.  Recent Labs: 03/17/2018: B  Natriuretic Peptide 358.5 03/19/2018: Magnesium 1.9 03/28/2018: ALT 18; BUN 9; Creatinine, Ser 1.24; Potassium 4.5; Sodium 138 03/31/2018: Hemoglobin 9.7; Platelets 485   Lipid Panel    Component Value Date/Time   CHOL 75 03/17/2018 0339   TRIG 114 03/17/2018 0339   HDL 13 (L) 03/17/2018 0339   CHOLHDL 5.8 03/17/2018 0339   VLDL 23 03/17/2018 0339   LDLCALC 39 03/17/2018 0339    Additional studies/ records that were reviewed today include: Summarized above  ASSESSMENT & PLAN:   1. Persistent atrial fib - Dr .Tamala Julian had suggested on his last rounding note to consider setting up electrical DCCV if patient was still in atrial fib, which is the case. I do suspect he may have had atrial fib prior to his recent admission considering that he had a stroke, but given relatively asymptomatic nature, it's not really clear. He is now on AVN blocking agents so it is reasonable to proceed with attempt at restoration of NSR. Dr. Tamala Julian also suggested titrating Toprol to 200mg  daily if further rate control needed. HR remains borderline elevated and he continues to have some dyspnea on exertion. Will increase Toprol and continue diltiazem and Eliquis at present doses. The patient would like to proceed with DCCV - we have no availability on his initial chosen date of Tuesday but we do on Friday. Risks/benefits  reviewed and he agrees to proceed. Discussed importance of no missed anticoag doses. He will hold DM meds AM of procedure. If he returns to atrial fib post DCCV will need to discuss rate vs rhythm control with primary cardiologist contingent on how he is feeling. F/u CBC/BMET today and check thyroid.  2. Elevated troponin - no specific ischemic workup recommended in the hospital. Mildly elevated, possibly demand ischemia. LVEF normal. If dyspnea does not resolve with gradual increase in activity and restoration of NSR, consider ischemic evaluation. He has not had any angina and up until 2 months ago was exercising regularly. 3. Shortness of breath - does not occur at rest, only noted with exertion. Likely multifactorial in setting of recent hospitalization and continued AF. See above. Will also f/u 2V CXR today given that O2 sat is borderline, along with BNP. He has no signs of DVT on exam and has been fully anticoagulated so PE felt less likely. His pulse ox was actually hovering 93% at times in the hospital as well so this may require further pulm eval if it does not improve with above measures. 4. Essential HTN - follow BP with med titration. Overall controlled.  Disposition: F/u with Dr. Juanda Crumble 2 weeks after DCCV.   Medication Adjustments/Labs and Tests Ordered: Current medicines are reviewed at length with the patient today.  Concerns regarding medicines are outlined above. Medication changes, Labs and Tests ordered today are summarized above and listed in the Patient Instructions accessible in Encounters.   Signed, Charlie Pitter, PA-C  04/27/2018 11:55 AM    [address], [place], [place]  [address] Phone: [phone]; Fax: [phone]

## 2018-04-27 ENCOUNTER — Encounter: Payer: Self-pay | Admitting: *Deleted

## 2018-04-27 ENCOUNTER — Encounter: Payer: Self-pay | Admitting: Physician Assistant

## 2018-04-27 ENCOUNTER — Ambulatory Visit: Payer: Medicare Other | Admitting: Cardiology

## 2018-04-27 ENCOUNTER — Ambulatory Visit (INDEPENDENT_AMBULATORY_CARE_PROVIDER_SITE_OTHER): Payer: Medicare Other | Admitting: Physician Assistant

## 2018-04-27 ENCOUNTER — Ambulatory Visit (HOSPITAL_COMMUNITY)
Admission: RE | Admit: 2018-04-27 | Discharge: 2018-04-27 | Disposition: A | Discharge status: Home / Self Care | Payer: Medicare Other | Source: Ambulatory Visit | Attending: Physician Assistant | Admitting: Physician Assistant

## 2018-04-27 VITALS — BP 110/60 | HR 101 | Ht 73.0 in | Wt 212.4 lb

## 2018-04-27 DIAGNOSIS — I4819 Other persistent atrial fibrillation: Secondary | ICD-10-CM

## 2018-04-27 DIAGNOSIS — R748 Abnormal levels of other serum enzymes: Secondary | ICD-10-CM | POA: Diagnosis not present

## 2018-04-27 DIAGNOSIS — R0602 Shortness of breath: Secondary | ICD-10-CM | POA: Insufficient documentation

## 2018-04-27 DIAGNOSIS — R778 Other specified abnormalities of plasma proteins: Secondary | ICD-10-CM

## 2018-04-27 DIAGNOSIS — I481 Persistent atrial fibrillation: Secondary | ICD-10-CM | POA: Diagnosis not present

## 2018-04-27 DIAGNOSIS — J90 Pleural effusion, not elsewhere classified: Secondary | ICD-10-CM | POA: Insufficient documentation

## 2018-04-27 DIAGNOSIS — R7989 Other specified abnormal findings of blood chemistry: Secondary | ICD-10-CM

## 2018-04-27 DIAGNOSIS — I639 Cerebral infarction, unspecified: Secondary | ICD-10-CM

## 2018-04-27 DIAGNOSIS — I10 Essential (primary) hypertension: Secondary | ICD-10-CM

## 2018-04-27 MED ORDER — METOPROLOL SUCCINATE ER 100 MG PO TB24: 200.0000 mg | ORAL_TABLET | Freq: Every day | ORAL | 1 refills | Status: DC

## 2018-04-27 MED ORDER — DILTIAZEM HCL ER COATED BEADS 240 MG PO CP24: 240.0000 mg | ORAL_CAPSULE | Freq: Every day | ORAL | 11 refills | Status: DC

## 2018-04-27 MED ORDER — APIXABAN 5 MG PO TABS: 5.0000 mg | ORAL_TABLET | Freq: Two times a day (BID) | ORAL | 3 refills | Status: DC

## 2018-04-27 NOTE — Patient Instructions (Addendum)
Medication Instructions:  Your physician has recommended you make the following change in your medication:  1.  INCREASE the Metoprolol XL 100 to taking 2 tablets by mouth in the a.m   Labwork: TODAY:  BMET, CBC, TSH, & PRO BNP  Testing/Procedures: A chest x-ray takes a picture of the organs and structures inside the chest, including the heart, lungs, and blood vessels. This test can show several things, including, whether the heart is enlarges; whether fluid is building up in the lungs; and whether pacemaker / defibrillator leads are still in place.   Your physician has recommended that you have a Cardioversion (DCCV). Electrical Cardioversion uses a jolt of electricity to your heart either through paddles or wired patches attached to your chest. This is a controlled, usually prescheduled, procedure. Defibrillation is done under light anesthesia in the hospital, and you usually go home the day of the procedure. This is done to get your heart back into a normal rhythm. You are not awake for the procedure. Please see the instruction sheet given to you today.    Follow-Up: Your physician recommends that you schedule a follow-up appointment in: 2 WEEKS AFTER CARDIOVERSION, 05/19/18 ARRIVE AT 11:15 A.M.   Any Other Special Instructions Will Be Listed Below (If Applicable).       If you need a refill on your cardiac medications before your next appointment, please call your pharmacy.

## 2018-04-28 ENCOUNTER — Telehealth: Payer: Self-pay | Admitting: *Deleted

## 2018-04-28 DIAGNOSIS — I129 Hypertensive chronic kidney disease with stage 1 through stage 4 chronic kidney disease, or unspecified chronic kidney disease: Secondary | ICD-10-CM | POA: Diagnosis not present

## 2018-04-28 DIAGNOSIS — E1169 Type 2 diabetes mellitus with other specified complication: Secondary | ICD-10-CM | POA: Diagnosis not present

## 2018-04-28 DIAGNOSIS — Z4781 Encounter for orthopedic aftercare following surgical amputation: Secondary | ICD-10-CM | POA: Diagnosis not present

## 2018-04-28 DIAGNOSIS — I480 Paroxysmal atrial fibrillation: Secondary | ICD-10-CM | POA: Diagnosis not present

## 2018-04-28 DIAGNOSIS — E1140 Type 2 diabetes mellitus with diabetic neuropathy, unspecified: Secondary | ICD-10-CM | POA: Diagnosis not present

## 2018-04-28 DIAGNOSIS — M86272 Subacute osteomyelitis, left ankle and foot: Secondary | ICD-10-CM | POA: Diagnosis not present

## 2018-04-28 DIAGNOSIS — Z79899 Other long term (current) drug therapy: Secondary | ICD-10-CM

## 2018-04-28 LAB — BASIC METABOLIC PANEL
BUN/Creatinine Ratio: 14 (ref 10–24)
BUN: 19 mg/dL (ref 8–27)
CO2: 22 mmol/L (ref 20–29)
Calcium: 9.1 mg/dL (ref 8.6–10.2)
Chloride: 100 mmol/L (ref 96–106)
Creatinine, Ser: 1.4 mg/dL — ABNORMAL HIGH (ref 0.76–1.27)
GFR calc Af Amer: 57 mL/min/{1.73_m2} — ABNORMAL LOW (ref >59)
GFR calc non Af Amer: 49 mL/min/{1.73_m2} — ABNORMAL LOW (ref >59)
Glucose: 214 mg/dL — ABNORMAL HIGH (ref 65–99)
Potassium: 4.8 mmol/L (ref 3.5–5.2)
Sodium: 137 mmol/L (ref 134–144)

## 2018-04-28 LAB — CBC
Hematocrit: 31.5 % — ABNORMAL LOW (ref 37.5–51.0)
Hemoglobin: 9.9 g/dL — ABNORMAL LOW (ref 13.0–17.7)
MCH: 27.7 pg (ref 26.6–33.0)
MCHC: 31.4 g/dL — ABNORMAL LOW (ref 31.5–35.7)
MCV: 88 fL (ref 79–97)
Platelets: 592 10*3/uL — ABNORMAL HIGH (ref 150–450)
RBC: 3.57 x10E6/uL — ABNORMAL LOW (ref 4.14–5.80)
RDW: 14.2 % (ref 12.3–15.4)
WBC: 10.7 10*3/uL (ref 3.4–10.8)

## 2018-04-28 LAB — PRO B NATRIURETIC PEPTIDE: NT-Pro BNP: 3814 pg/mL — ABNORMAL HIGH (ref 0–376)

## 2018-04-28 LAB — TSH: TSH: 4.45 u[IU]/mL (ref 0.450–4.500)

## 2018-04-28 MED ORDER — FUROSEMIDE 20 MG PO TABS: 20.0000 mg | ORAL_TABLET | Freq: Every day | ORAL | 3 refills | Status: DC

## 2018-04-28 NOTE — Telephone Encounter (Signed)
-----   Message from Charlie Pitter, [place] sent at 04/28/2018  9:05 AM EDT ----- Please call patient.  His labs would suggest he's developed a small amount of fluid in his lungs, which would be a condition we call congestive heart failure. This basically means the heart pump is not effectively circulating the blood volume so it backs up like a traffic jam. In his situation, this is likely related to the atrial fib and recent complex medical issues, because we know his heart muscle is strong. I discussed with Dr. [name]. We would recommend he begin Lasix 20mg  daily in addition to the change we made yesterday to the Toprol. Recheck BMET on Wednesday in prep for cardioversion on Friday. Dr. [name] did not necessarily think we needed to acutely move up the cardioversion but we also think it would be helpful not to defer any later than what's scheduled. Hemoglobin and platelet count appear fairly similar to recent hospitalization. He also looks like he has chronic kidney insufficiency (also fairly similar to recent stay) so in general should be aware of the recommendation to avoid NSAIDS (IE ibuprofen, Advil, Motrin, naproxen, and Aleve). Don't hesitate to contact the office for any worsening shortness of breath or symptoms.  Dayna Dunn PA-C

## 2018-05-02 DIAGNOSIS — I480 Paroxysmal atrial fibrillation: Secondary | ICD-10-CM | POA: Diagnosis not present

## 2018-05-02 DIAGNOSIS — Z4781 Encounter for orthopedic aftercare following surgical amputation: Secondary | ICD-10-CM | POA: Diagnosis not present

## 2018-05-02 DIAGNOSIS — E1169 Type 2 diabetes mellitus with other specified complication: Secondary | ICD-10-CM | POA: Diagnosis not present

## 2018-05-02 DIAGNOSIS — E1140 Type 2 diabetes mellitus with diabetic neuropathy, unspecified: Secondary | ICD-10-CM | POA: Diagnosis not present

## 2018-05-02 DIAGNOSIS — I129 Hypertensive chronic kidney disease with stage 1 through stage 4 chronic kidney disease, or unspecified chronic kidney disease: Secondary | ICD-10-CM | POA: Diagnosis not present

## 2018-05-02 DIAGNOSIS — M86272 Subacute osteomyelitis, left ankle and foot: Secondary | ICD-10-CM | POA: Diagnosis not present

## 2018-05-03 ENCOUNTER — Encounter: Payer: Self-pay | Admitting: Adult Health

## 2018-05-03 ENCOUNTER — Ambulatory Visit (INDEPENDENT_AMBULATORY_CARE_PROVIDER_SITE_OTHER): Payer: Medicare Other | Admitting: Adult Health

## 2018-05-03 ENCOUNTER — Other Ambulatory Visit: Payer: Medicare Other | Admitting: *Deleted

## 2018-05-03 VITALS — BP 102/54 | HR 72 | Ht 73.0 in | Wt 212.0 lb

## 2018-05-03 DIAGNOSIS — I639 Cerebral infarction, unspecified: Secondary | ICD-10-CM

## 2018-05-03 DIAGNOSIS — I10 Essential (primary) hypertension: Secondary | ICD-10-CM | POA: Diagnosis not present

## 2018-05-03 DIAGNOSIS — E785 Hyperlipidemia, unspecified: Secondary | ICD-10-CM

## 2018-05-03 DIAGNOSIS — Z79899 Other long term (current) drug therapy: Secondary | ICD-10-CM | POA: Diagnosis not present

## 2018-05-03 DIAGNOSIS — I480 Paroxysmal atrial fibrillation: Secondary | ICD-10-CM | POA: Diagnosis not present

## 2018-05-03 DIAGNOSIS — E119 Type 2 diabetes mellitus without complications: Secondary | ICD-10-CM

## 2018-05-03 DIAGNOSIS — R2990 Unspecified symptoms and signs involving the nervous system: Secondary | ICD-10-CM

## 2018-05-03 NOTE — Patient Instructions (Addendum)
Continue Eliquis (apixaban) daily  and crestor  for secondary stroke prevention  Continue to follow up with PCP regarding cholesterol, blood pressure and diabetes management   Continue to follow up with cardiologist as scheduled for atrial fibrillation and eliquis management  Continue to monitor blood pressure at home  Maintain strict control of hypertension with blood pressure goal below 130/90, diabetes with hemoglobin A1c goal below 6.5% and cholesterol with LDL cholesterol (bad cholesterol) goal below 70 mg/dL. I also advised the patient to eat a healthy diet with plenty of whole grains, cereals, fruits and vegetables, exercise regularly and maintain ideal body weight.  Followup in the future with me in 3 months or call earlier if needed        Thank you for coming to see [place] at [facility]. I hope we have been able to provide you high quality care today.  You may receive a patient satisfaction survey over the next few weeks. We would appreciate your feedback and comments so that we may continue to improve ourselves and the health of our patients.

## 2018-05-03 NOTE — Progress Notes (Signed)
[facility] [address] [place] [address] [phone]       OFFICE FOLLOW UP NOTE  [name] Date of Birth:  [birth year] Medical Record Number:  [id number]   Reason for Referral:  hospital stroke follow up  CHIEF COMPLAINT:  Chief Complaint  Patient presents with  . hospital f/u    03/16/18 hospitalization with dizziness, weakness, couple of falls, legs felt like rubber. Uses a walker has kept himself mobile. He has some SOB but otherwise feels good. He is on lasix now, weighs daily. He has CHF, A-fib.    Marland Kitchen Room 9    He is here with his wife [name]    HPI: Kevin [name] is being seen today for initial visit in the office for stroke like episode s/p IV tPA on 03/16/18. History obtained from patient and chart review. Reviewed all radiology images and labs personally.  Kevin [name] is a 77 y.o. male with history of HTN and DB who presented with L sided weakness, L facial droop and dysphagia.  CT head reviewed and showed no acute stroke.  Patient did receive TPA without complication.  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  Repeat CT scan 24 hours after TPA negative for acute infarct.  MRI head reviewed was negative for acute infarct but did show small vessel disease.  2D echo showed an EF of 60 to 65% without cardiac source of embolus.  LDL satisfactory at 39 and recommended continuation of current statin.  A1c elevated at 7.6 and recommended tight glycemic control with close PCP follow-up.  HTN stable after medication adjustment and recommended BP goal normotensive.  Patient was previously on aspirin 81 mg PTA and recommended to continue this but throughout hospital stay patient was found to develop new onset atrial fibrillation and recommended management with Eliquis.  Patient was discharged to [facility] for continuation of therapies.  Patient is being seen today for  hospital follow-up and is accompanied by his wife.  He states he has been doing well with no residual stroke deficits.  He continues to be compliant with Eliquis without side effects of bleeding or bruising.  Continues to take Crestor without side effects myalgias.  Blood pressure today 102/54 and per patient, this is typically his normal as he monitors this at home.  Does check glucose levels at home which have been stable.  Recently diagnosed with CHF and was started on Lasix which has improved his shortness of breath and his weight has been consistent.  He will be undergoing cardioversion on Friday.  He does have a follow-up with [name] Given on 05/04/2018 for follow-up of left metatarsal amputation.  He states this is been doing well, continues to wear the boot and has been using rolling walker for ambulation.  Denies new or worsening stroke/TIA symptoms.     ROS:   14 system review of systems performed and negative with exception of activity change, appetite change and shortness of breath  PMH:  Past Medical History:  Diagnosis Date  . A-fib ([place]) 03/20/2018  . Acute blood loss anemia   . Benign essential HTN   . Benign prostatic hyperplasia with urinary retention   . Cerebrovascular accident (CVA) ([place])   . CKD (chronic kidney disease), stage III ([place])   . Diabetes ([place]) 03/20/2018  . Diabetes mellitus type 2 in nonobese ([facility])   . Essential hypertension 03/20/2018  .  Former tobacco use   . H/O ulcerative colitis 1993  . Hypertension   . Hypokalemia   . Leukocytosis   . New onset atrial fibrillation ([place])   . 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  [name] (paroxysmal atrial fibrillation) ([place])    a. dx 03/2018 in setting of stroke, osteomyelitis.  . Sepsis ([place]) 03/20/2018  . Stage 3 chronic kidney disease ([place])    pt/family unaware  . 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 Stroke ([place]) 03/2018  . Stroke-like episode ([place]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  03/16/2018  . Tachycardia 03/20/2018  . Wrist fracture    Left    PSH:  Past Surgical History:  Procedure Laterality Date  . AMPUTATION Left 03/24/2018   Procedure: Transmetatarsal Amputation Left Foot;  Surgeon: [name], MD;  Location: [facility];  Service: Orthopedics;  Laterality: Left;  . COLONOSCOPY  12/2017   benign, remission from ulcerative colitis in 90s  . I&D EXTREMITY Left 03/17/2018   Procedure: IRRIGATION AND DEBRIDEMENT FOOT;  Surgeon: [name], MD;  Location: [facility];  Service: Orthopedics;  Laterality: Left;    Social History:  Social History   Socioeconomic History  . Marital status: Married    Spouse name: Not on file  . Number of children: Not on file  . Years of education: Not on file  . Highest education level: Bachelor's degree (e.g., BA, AB, BS)  Occupational History  . Not on file  Social Needs  . Financial resource strain: Not on file  . Food insecurity:    Worry: Not on file    Inability: Not on file  . Transportation needs:    Medical: Not on file    Non-medical: Not on file  Tobacco Use  . Smoking status: Former Smoker    Packs/day: 1.00    Types: Cigarettes    Start date: 1963    Last attempt to quit: 1985    Years since quitting: 34.6  . Smokeless tobacco: Never Used  Substance and Sexual Activity  . Alcohol use: Yes    Alcohol/week: 2.4 oz    Types: 4 Cans of beer per week    Comment: social   . Drug use: Never  . Sexual activity: Not on file  Lifestyle  . Physical activity:    Days per week: Not on file    Minutes per session: Not on file  . Stress: Not on file  Relationships  . Social connections:    Talks on phone: Not on file    Gets together: Not on file    Attends religious service: Not on file    Active member of club or organization: Not on file    Attends meetings of clubs or organizations: Not on file    Relationship status: Not on file  . Intimate partner violence:    Fear of current or ex partner:  Not on file    Emotionally abused: Not on file    Physically abused: Not on file    Forced sexual activity: Not on file  Other Topics Concern  . Not on file  Social History Narrative  . Not on file    Family History:  Family History  Problem Relation Age of Onset  . Hypertension Father     Medications:   Current Outpatient Medications on File Prior to Visit  Medication Sig Dispense Refill  . apixaban (ELIQUIS) 5 MG TABS tablet Take 1 tablet (  5 mg total) by mouth 2 (two) times daily. 180 tablet 3  . diltiazem (CARDIZEM CD) 240 MG 24 hr capsule Take 1 capsule (240 mg total) by mouth daily. 30 capsule 11  . FLUoxetine (PROZAC) 20 MG capsule Take 20 mg by mouth daily.    . fluticasone (FLONASE) 50 MCG/ACT nasal spray Place 2 sprays into both nostrils at bedtime.    . furosemide (LASIX) 20 MG tablet Take 1 tablet (20 mg total) by mouth daily. 90 tablet 3  . glimepiride (AMARYL) 4 MG tablet Take 4 mg by mouth daily with breakfast.    . mesalamine (APRISO) 0.375 g 24 hr capsule Take 750 mg by mouth 2 (two) times daily.     . metFORMIN (GLUCOPHAGE) 500 MG tablet Take 1,000 mg by mouth 2 (two) times daily with a meal.     . metoprolol succinate (TOPROL-XL) 100 MG 24 hr tablet Take 2 tablets (200 mg total) by mouth daily. Take with or immediately following a meal. 180 tablet 1  . Multiple Vitamin (MULTIVITAMIN WITH MINERALS) TABS tablet Take 1 tablet by mouth daily.    . mupirocin ointment (BACTROBAN) 2 % Apply 1 application topically daily.    . rosuvastatin (CRESTOR) 10 MG tablet Take 0.5 tablets (5 mg total) by mouth daily.    . sitaGLIPtin (JANUVIA) 100 MG tablet Take 100 mg by mouth daily.    . tamsulosin (FLOMAX) 0.4 MG CAPS capsule Take 1 capsule (0.4 mg total) by mouth 2 times daily at 12 noon and 4 pm. 60 capsule 0   No current facility-administered medications on file prior to visit.     Allergies:   Allergies  Allergen Reactions  . Sulfa Antibiotics Rash     Physical  Exam  Vitals:   05/03/18 1433  Weight: 212 lb (96.2 kg)  Height: 6\' 1"  (1.854 m)   Body mass index is 27.97 kg/m. No exam data present  General: well developed, well nourished, pleasant elderly Caucasian male, seated, in no evident distress Head: head normocephalic and atraumatic.   Neck: supple with no carotid or supraclavicular bruits Cardiovascular: regular rate and rhythm, no murmurs Musculoskeletal: Left metatarsal amputation Skin:  no rash/petichiae Vascular:  Normal pulses all extremities  Neurologic Exam Mental Status: Awake and fully alert. Oriented to place and time. Recent and remote memory intact. Attention span, concentration and fund of knowledge appropriate. Mood and affect appropriate.  Cranial Nerves: Fundoscopic exam reveals sharp disc margins. Pupils equal, briskly reactive to light. Extraocular movements full without nystagmus. Visual fields full to confrontation. Hearing intact. Facial sensation intact. Face, tongue, palate moves normally and symmetrically.  Motor: Normal bulk and tone. Normal strength in all tested extremity muscles. Sensory.: intact to touch , pinprick , position and vibratory sensation.  Coordination: Rapid alternating movements normal in all extremities. Finger-to-nose and heel-to-shin performed accurately bilaterally. Gait and Station: Arises from chair without difficulty. Stance is normal. Gait demonstrates normal stride length and balance with assistance of rolling walker. Reflexes: 1+ and symmetric. Toes downgoing.    NIHSS  0 Modified Rankin  0 HAS-BLED 2 CHA2DS2-VASc 6   Diagnostic Data (Labs, Imaging, Testing)   IMAGING Ct Head Code Stroke Wo Contrast 03/16/2018 2051 1. Negative for age. No acute cortically based infarct or intracranial hemorrhage is identified. 2. ASPECTS is 10. 3. Chronic maxillary sinusitis.   Ct Angio Head W Or Wo Contrast Ct Angio Neck W Or Wo Contrast 03/16/2018 2125 1. Negative for large vessel  occlusion. 2. Positive for  atherosclerosis in the head and neck, and Aortic Atherosclerosis (ICD10-I70.0). 3. 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 No hemodynamically significant carotid or right vertebral stenosis. 4.  Emphysema   Ct Head Wo Contrast 03/17/2018 1751 Negative non-contrast CT HEAD for age.   Mr Brain Wo Contrast 03/18/2018 1450 1. No acute intracranial abnormality. 2. Mild chronic small vessel ischemic disease.   2D Echocardiogram  03/17/2018  - Left ventricle: The cavity size was normal. Systolic function wasnormal. The estimated ejection fraction was in the range of 60% to 65%. Wall motion was normal; there were no regional wallmotion abnormalities. There was no evidence of elevatedventricular filling pressure by Doppler parameters.  - Left atrium: The atrium was moderately dilated. Impressions:   No cardiac source of emboli was indentified      ASSESSMENT: Yanni Quiroa is a 77 y.o. year old male here with strokelike episode s/p TPA on 03/16/2018 secondary to new onset AF. Vascular risk factors include HTN, HLD, DM and new onset AF.  Patient is being seen today for hospital follow-up and overall doing well without residual stroke deficits.    PLAN: -Continue Eliquis (apixaban) daily  and Crestor for secondary stroke prevention -F/u with PCP regarding your HLD, HTN and DM management -f/u with cardiologist as scheduled for atrial fibrillation and Eliquis management -Was advised to continue to stay active and maintain a healthy diet -continue to monitor BP at home -Maintain strict control of hypertension with blood pressure goal below 130/90, diabetes with hemoglobin A1c goal below 6.5% and cholesterol with LDL cholesterol (bad cholesterol) goal below 70 mg/dL. I also advised the patient to eat a healthy diet with plenty of whole grains, cereals, fruits and vegetables, exercise regularly and maintain ideal body  weight.  Follow up in 3 months or call earlier if needed   Greater than 50% of time during this 25 minute visit was spent on counseling,explanation of diagnosis of strokelike symptoms s/p tPA, reviewing risk factor management of HLD, HTN, DM and AF, planning of further management, discussion with patient and family and coordination of care    Venancio Poisson, [facility]  [facility] [address] [place], [place] [address]  Phone [phone] Fax [phone]

## 2018-05-04 ENCOUNTER — Ambulatory Visit (INDEPENDENT_AMBULATORY_CARE_PROVIDER_SITE_OTHER): Payer: Medicare Other | Admitting: Orthopedic Surgery

## 2018-05-04 ENCOUNTER — Other Ambulatory Visit (INDEPENDENT_AMBULATORY_CARE_PROVIDER_SITE_OTHER): Payer: Self-pay | Admitting: Orthopedic Surgery

## 2018-05-04 ENCOUNTER — Encounter (INDEPENDENT_AMBULATORY_CARE_PROVIDER_SITE_OTHER): Payer: Self-pay | Admitting: Orthopedic Surgery

## 2018-05-04 VITALS — Ht 73.0 in | Wt 212.0 lb

## 2018-05-04 DIAGNOSIS — Z89432 Acquired absence of left foot: Secondary | ICD-10-CM

## 2018-05-04 DIAGNOSIS — T8781 Dehiscence of amputation stump: Secondary | ICD-10-CM

## 2018-05-04 LAB — BASIC METABOLIC PANEL
BUN/Creatinine Ratio: 13 (ref 10–24)
BUN: 18 mg/dL (ref 8–27)
CO2: 26 mmol/L (ref 20–29)
Calcium: 9.5 mg/dL (ref 8.6–10.2)
Chloride: 98 mmol/L (ref 96–106)
Creatinine, Ser: 1.37 mg/dL — ABNORMAL HIGH (ref 0.76–1.27)
GFR calc Af Amer: 58 mL/min/{1.73_m2} — ABNORMAL LOW (ref >59)
GFR calc non Af Amer: 50 mL/min/{1.73_m2} — ABNORMAL LOW (ref >59)
Glucose: 161 mg/dL — ABNORMAL HIGH (ref 65–99)
Potassium: 4.3 mmol/L (ref 3.5–5.2)
Sodium: 140 mmol/L (ref 134–144)

## 2018-05-05 ENCOUNTER — Ambulatory Visit (HOSPITAL_BASED_OUTPATIENT_CLINIC_OR_DEPARTMENT_OTHER)
Admission: RE | Admit: 2018-05-05 | Discharge: 2018-05-05 | Disposition: A | Discharge status: Home / Self Care | Payer: Medicare Other | Source: Ambulatory Visit | Attending: Cardiovascular Disease | Admitting: Cardiovascular Disease

## 2018-05-05 ENCOUNTER — Ambulatory Visit (HOSPITAL_COMMUNITY): Payer: Medicare Other | Admitting: Certified Registered"

## 2018-05-05 ENCOUNTER — Encounter (HOSPITAL_COMMUNITY): Payer: Self-pay | Admitting: Certified Registered"

## 2018-05-05 ENCOUNTER — Other Ambulatory Visit: Payer: Self-pay

## 2018-05-05 ENCOUNTER — Encounter (HOSPITAL_COMMUNITY)
Admission: RE | Disposition: A | Discharge status: Home / Self Care | Payer: Self-pay | Source: Ambulatory Visit | Attending: Cardiovascular Disease

## 2018-05-05 DIAGNOSIS — G629 Polyneuropathy, unspecified: Secondary | ICD-10-CM | POA: Diagnosis not present

## 2018-05-05 DIAGNOSIS — I4510 Unspecified right bundle-branch block: Secondary | ICD-10-CM | POA: Insufficient documentation

## 2018-05-05 DIAGNOSIS — I130 Hypertensive heart and chronic kidney disease with heart failure and stage 1 through stage 4 chronic kidney disease, or unspecified chronic kidney disease: Secondary | ICD-10-CM | POA: Diagnosis not present

## 2018-05-05 DIAGNOSIS — E1122 Type 2 diabetes mellitus with diabetic chronic kidney disease: Secondary | ICD-10-CM

## 2018-05-05 DIAGNOSIS — I509 Heart failure, unspecified: Secondary | ICD-10-CM | POA: Diagnosis not present

## 2018-05-05 DIAGNOSIS — Z7984 Long term (current) use of oral hypoglycemic drugs: Secondary | ICD-10-CM | POA: Insufficient documentation

## 2018-05-05 DIAGNOSIS — I4819 Other persistent atrial fibrillation: Secondary | ICD-10-CM

## 2018-05-05 DIAGNOSIS — I5033 Acute on chronic diastolic (congestive) heart failure: Secondary | ICD-10-CM | POA: Diagnosis not present

## 2018-05-05 DIAGNOSIS — Z87891 Personal history of nicotine dependence: Secondary | ICD-10-CM

## 2018-05-05 DIAGNOSIS — I480 Paroxysmal atrial fibrillation: Secondary | ICD-10-CM | POA: Diagnosis not present

## 2018-05-05 DIAGNOSIS — R0603 Acute respiratory distress: Secondary | ICD-10-CM | POA: Diagnosis not present

## 2018-05-05 DIAGNOSIS — N289 Disorder of kidney and ureter, unspecified: Secondary | ICD-10-CM | POA: Diagnosis not present

## 2018-05-05 DIAGNOSIS — I110 Hypertensive heart disease with heart failure: Secondary | ICD-10-CM | POA: Diagnosis not present

## 2018-05-05 DIAGNOSIS — N183 Chronic kidney disease, stage 3 (moderate): Secondary | ICD-10-CM

## 2018-05-05 DIAGNOSIS — N400 Enlarged prostate without lower urinary tract symptoms: Secondary | ICD-10-CM | POA: Diagnosis not present

## 2018-05-05 DIAGNOSIS — E1151 Type 2 diabetes mellitus with diabetic peripheral angiopathy without gangrene: Secondary | ICD-10-CM | POA: Diagnosis not present

## 2018-05-05 DIAGNOSIS — I129 Hypertensive chronic kidney disease with stage 1 through stage 4 chronic kidney disease, or unspecified chronic kidney disease: Secondary | ICD-10-CM | POA: Insufficient documentation

## 2018-05-05 DIAGNOSIS — I481 Persistent atrial fibrillation: Secondary | ICD-10-CM | POA: Diagnosis not present

## 2018-05-05 DIAGNOSIS — R0602 Shortness of breath: Secondary | ICD-10-CM | POA: Diagnosis not present

## 2018-05-05 DIAGNOSIS — Z8673 Personal history of transient ischemic attack (TIA), and cerebral infarction without residual deficits: Secondary | ICD-10-CM | POA: Insufficient documentation

## 2018-05-05 DIAGNOSIS — E1121 Type 2 diabetes mellitus with diabetic nephropathy: Secondary | ICD-10-CM | POA: Diagnosis not present

## 2018-05-05 DIAGNOSIS — D649 Anemia, unspecified: Secondary | ICD-10-CM | POA: Diagnosis not present

## 2018-05-05 DIAGNOSIS — I4891 Unspecified atrial fibrillation: Secondary | ICD-10-CM | POA: Diagnosis not present

## 2018-05-05 DIAGNOSIS — E1169 Type 2 diabetes mellitus with other specified complication: Secondary | ICD-10-CM | POA: Diagnosis not present

## 2018-05-05 DIAGNOSIS — Z7901 Long term (current) use of anticoagulants: Secondary | ICD-10-CM

## 2018-05-05 HISTORY — PX: CARDIOVERSION: SHX1299

## 2018-05-05 LAB — GLUCOSE, CAPILLARY: Glucose-Capillary: 164 mg/dL — ABNORMAL HIGH (ref 70–99)

## 2018-05-05 SURGERY — CARDIOVERSION
Anesthesia: General

## 2018-05-05 MED ORDER — SODIUM CHLORIDE 0.9% FLUSH: 3.0000 mL | Freq: Two times a day (BID) | INTRAVENOUS | Status: DC

## 2018-05-05 MED ORDER — SODIUM CHLORIDE 0.9 % IV SOLN
250.0000 mL | INTRAVENOUS | Status: DC
  Administered 2018-05-05: 250 mL via INTRAVENOUS

## 2018-05-05 MED ORDER — LIDOCAINE 2% (20 MG/ML) 5 ML SYRINGE
INTRAMUSCULAR | Status: DC | PRN
  Administered 2018-05-05: 40 mg via INTRAVENOUS

## 2018-05-05 MED ORDER — PROPOFOL 10 MG/ML IV BOLUS
INTRAVENOUS | Status: DC | PRN
  Administered 2018-05-05: 100 mg via INTRAVENOUS

## 2018-05-05 MED ORDER — SODIUM CHLORIDE 0.9% FLUSH: 3.0000 mL | INTRAVENOUS | Status: DC | PRN

## 2018-05-05 NOTE — Transfer of Care (Signed)
Immediate Anesthesia Transfer of Care Note  Patient: Kevin [name]  Procedure(s) Performed: CARDIOVERSION (N/A )  Patient Location: Endoscopy Unit  Anesthesia Type:General  Level of Consciousness: awake, alert  and oriented  Airway & Oxygen Therapy: Patient Spontanous Breathing and Patient connected to nasal cannula oxygen  Post-op Assessment: Report given to RN and Post -op Vital signs reviewed and stable  Post vital signs: Reviewed and stable  Last Vitals:  Vitals Value Taken Time  BP    Temp    Pulse    Resp    SpO2      Last Pain:  Vitals:   05/05/18 0709  TempSrc: Oral  PainSc: 0-No pain         Complications: No apparent anesthesia complications

## 2018-05-05 NOTE — Anesthesia Preprocedure Evaluation (Signed)
Anesthesia Evaluation  Patient identified by MRN, date of birth, ID band Patient awake    Reviewed: Allergy & Precautions, NPO status , Patient's Chart, lab work & pertinent test results  Airway Mallampati: I       Dental  (+) Teeth Intact   Pulmonary former smoker,    Pulmonary exam normal        Cardiovascular hypertension, Pt. on medications and Pt. on home beta blockers Normal cardiovascular exam+ dysrhythmias Atrial Fibrillation  Rhythm:Regular Rate:Normal     Neuro/Psych CVA, No Residual Symptoms    GI/Hepatic negative GI ROS, Neg liver ROS,   Endo/Other  diabetes, Oral Hypoglycemic Agents  Renal/GU Renal InsufficiencyRenal disease     Musculoskeletal   Abdominal Normal abdominal exam  (+)   Peds  Hematology  (+) anemia ,   Anesthesia Other Findings Kevin [name]  ECHO COMPLETE WO IMAGING ENHANCING AGENT  Order# [id number]  Reading physician: Jerline Pain, MD Ordering physician: Kerney Elbe, MD Study date: 03/17/18 Study Result   Result status: Final result                             *[facility]*                         [address], [place] [address]                            [phone]  ------------------------------------------------------------------- Transthoracic Echocardiography  Patient:    Kevin [name], Kevin [name] MR #:       [id number] Study Date: 03/17/2018 Gender:     M Age:        77 Height:     185.4 cm Weight:     97.8 kg BSA:        2.26 m^2 Pt. Status: Room:       4N30C   SONOGRAPHER  Dustin Flock, RCS  ADMITTING    Kerney Elbe  ATTENDING    Nancy Marus, Eric  REFERRING    Lindzen, Eric  PERFORMING   [facility], Inpatient  cc:  ------------------------------------------------------------------- LV EF: 60% -    65%  ------------------------------------------------------------------- Indications:      CVA 44.  ------------------------------------------------------------------- History:   Risk factors:  Hypertension. Diabetes mellitus.  ------------------------------------------------------------------- Study Conclusions  - Left ventricle: The cavity size was normal. Systolic function was   normal. The estimated ejection fraction was in the range of 60%   to 65%. Wall motion was normal; there were no regional wall   motion abnormalities. There was no evidence of elevated   ventricular filling pressure by Doppler parameters. - Left atrium: The atrium was moderately dilated.  Impressions:  - No cardiac source of emboli was indentified.  ------------------------------------------------------------------- Study data:  No prior study was available for comparison.  Study status:  Routine.  Procedure:  The patient reported no pain pre or post test. Transthoracic echocardiography. Image quality was adequate.  Study completion:  There were no complications. Transthoracic echocardiography.  M-mode, complete 2D, spectral Doppler, and color Doppler.  Birthdate:  Patient birthdate:  04/29/1941.  Age:  Patient is 77 yr old.  Sex:  Gender: male. BMI: 28.5 kg/m^2.  Blood pressure:     142/70  Patient status: Inpatient.  Study date:  Study date: 03/17/2018. Study time: 10:38 AM.  Location:  Bedside.  -------------------------------------------------------------------  ------------------------------------------------------------------- Left ventricle:  The cavity size was normal. Systolic function was normal. The estimated ejection fraction was in the range of 60% to 65%. Wall motion was normal; there were no regional wall motion abnormalities. There was no evidence of elevated ventricular filling pressure by Doppler  parameters.  ------------------------------------------------------------------- Aortic valve:   Trileaflet; normal thickness leaflets. Mobility was not restricted.  Doppler:  Transvalvular velocity was within the normal range. There was no stenosis. There was no regurgitation.   ------------------------------------------------------------------- Aorta:  Aortic root: The aortic root was normal in size.  ------------------------------------------------------------------- Mitral valve:   Structurally normal valve.   Mobility was not restricted.  Doppler:  Transvalvular velocity was within the normal range. There was no evidence for stenosis. There was no regurgitation.  ------------------------------------------------------------------- Left atrium:  The atrium was moderately dilated.  ------------------------------------------------------------------- Right ventricle:  The cavity size was normal. Wall thickness was normal. Systolic function was normal.  ------------------------------------------------------------------- Pulmonic valve:   Poorly visualized.  Structurally normal valve. Cusp separation was normal.  Doppler:  Transvalvular velocity was within the normal range. There was no evidence for stenosis. There was no regurgitation.  ------------------------------------------------------------------- Tricuspid valve:   Structurally normal valve.    Doppler: Transvalvular velocity was within the normal range. There was no regurgitation.  ------------------------------------------------------------------- Pulmonary artery:   The main pulmonary artery was normal-sized. Systolic pressure was within the normal range.  ------------------------------------------------------------------- Right atrium:  The atrium was normal in size.  ------------------------------------------------------------------- Pericardium:  There was no pericardial  effusion.  ------------------------------------------------------------------- Systemic veins: Inferior vena cava: The vessel was normal in size.  ------------------------------------------------------------------- Measurements   Left ventricle                        Value        Reference  LV e&', lateral                        9.79  cm/s   ----------  LV E/e&', lateral                      6.49         ----------  LV e&', medial                         6.74  cm/s   ----------  LV E/e&', medial                       9.42         ----------  LV e&', average                        8.27  cm/s   ----------  LV E/e&', average                      7.68         ----------    LVOT  Value        Reference  LVOT peak velocity, S                 101   cm/s   ----------  LVOT mean velocity, S                 72.1  cm/s   ----------  LVOT VTI, S                           20.8  cm     ----------  LVOT peak gradient, S                 4     mm Hg  ----------    Left atrium                           Value        Reference  LA volume, S                          104   ml     ----------  LA volume/bsa, S                      46    ml/m^2 ----------  LA volume, ES, 1-p A4C                96    ml     ----------  LA volume/bsa, ES, 1-p A4C            42.4  ml/m^2 ----------  LA volume, ES, 1-p A2C                112   ml     ----------  LA volume/bsa, ES, 1-p A2C            49.5  ml/m^2 ----------    Mitral valve                          Value        Reference  Mitral E-wave peak velocity           63.5  cm/s   ----------  Mitral A-wave peak velocity           53.4  cm/s   ----------  Mitral deceleration time        (H)   243   ms     150 - 230  Mitral E/A ratio, peak                1.2          ----------    Right atrium                          Value        Reference  RA ID, S-I, ES, A4C             (H)   54.4  mm     34 - 49  RA area, ES, A4C                       17.8  cm^2   8.3 - 19.5  RA volume, ES, A/L  47.5  ml     ----------  RA volume/bsa, ES, A/L                21    ml/m^2 ----------    Right ventricle                       Value        Reference  RV ID, minor axis, ED, A4C base       24    mm     ----------  TAPSE                                 29.5  mm     ----------  RV s&', lateral, S                     11.5  cm/s   ----------  Legend: (L)  and  (H)  mark values outside specified reference range.  ------------------------------------------------------------------- Prepared and Electronically Authenticated by  Candee Furbish, M.D. 2019-06-21T14:09:04 [facility] Images   Show images for ECHOCARDIOGRAM COMPLETE Patient Information   Patient Name Kevin [name], Kevin [name] Sex Male DOB June 23, 1941 SSN [id number] Reason for Exam  Priority: Routine  Comments:  Surgical History   Surgical History    No past medical history on file.  Other Surgical History    Procedure Laterality Date Comment Source AMPUTATION Left 03/24/2018 Procedure: Transmetatarsal Amputation Left Foot; Surgeon: [name], MD; Location: [place]; Service: Orthopedics; Laterality: Left; Provider COLONOSCOPY  12/2017 benign, remission from ulcerative colitis in 90s Patient I&D EXTREMITY Left 03/17/2018 Procedure: [place]; Surgeon: [name], MD; Location: [place]; Service: Orthopedics; Laterality: Left; Provider  Patient Data   Height  73 in  BP  142/70 mmHg    Performing Technologist/Nurse   Performing Technologist/Nurse: Matilde Bash          Implants    No active implants to display in this view. Order-Level Documents - 03/16/2018:   Scan on 03/17/2018 10:52 AM by Default, Provider, MDScan on 03/17/2018 10:52 AM by Default, Provider, MD    Encounter-Level Documents - 03/16/2018:   Scan on 05/03/2018 12:56 PM by Default, Provider, MDScan on 05/03/2018 12:56 PM by  Default, Provider, MD  Scan on 05/03/2018 12:53 PM by Default, Provider, MDScan on 05/03/2018 12:53 PM by Default, Provider, MD  Scan on 05/03/2018 12:44 PM by Default, Provider, MDScan on 05/03/2018 12:44 PM by Default, Provider, MD  Scan on 03/24/2018 3:13 PM by Nolon Nations, MD: Left pop Scan on 03/24/2018 3:13 PM by Nolon Nations, MD: Left pop   Scan on 03/24/2018 3:13 PM by Nolon Nations, MD: Left acbScan on 03/24/2018 3:13 PM by Nolon Nations, MD: Left acb  Scan on 03/22/2018 12:52 PM by Default, Provider, MDScan on 03/22/2018 12:52 PM by Default, Provider, MD  Scan on 03/21/2018 4:32 PM by Victory Dakin on 03/21/2018 4:32 PM by Lindwood Qua  Scan on 03/21/2018 4:32 PM by Victory Dakin on 03/21/2018 4:32 PM by Lindwood Qua  Electronic signature on 03/21/2018 11:46 AM - Signed  Document on 03/20/2018 10:35 PM by Claude Manges, RN: ED PB Billing Extract  Scan on 03/20/2018 11:45 AM by Karmen Bongo, MDScan on 03/20/2018 11:45 AM by Karmen Bongo, MD  Scan on 03/27/2018 9:34 AM by Default, Provider, MDScan on 03/27/2018 9:34 AM by Default, Provider, MD  Scan on 03/17/2018 11:29 AM by Arnell Asal, NPScan on 03/17/2018 11:29 AM by Arnell Asal, NP  Scan on 03/17/2018 11:29 AM by Arnell Asal, NPScan on 03/17/2018 11:29 AM by Arnell Asal, NP  Scan on 03/17/2018 11:14 AM by Default, Provider, MDScan on 03/17/2018 11:14 AM by Default, Provider, MD  Scan on 03/27/2018 9:04 AM by Default, Provider, MDScan on 03/27/2018 9:04 AM by Default, Provider, MD    Signed   Electronically signed by Jerline Pain, MD on 03/17/18 at 1409 EDT Printable Result Report    Result Report  External Result Report    External Result Report     Reproductive/Obstetrics                             Anesthesia Physical Anesthesia Plan  ASA: II  Anesthesia Plan: General   Post-op Pain Management:    Induction: Intravenous  PONV Risk Score and Plan:    Airway Management Planned: Natural Airway and Simple Face Mask  Additional Equipment:   Intra-op Plan:   Post-operative Plan:   Informed Consent: I have reviewed the patients History and Physical, chart, labs and discussed the procedure including the risks, benefits and alternatives for the proposed anesthesia with the patient or authorized representative who has indicated his/her understanding and acceptance.     Plan Discussed with: CRNA  Anesthesia Plan Comments:         Anesthesia Quick Evaluation

## 2018-05-05 NOTE — Interval H&P Note (Signed)
History and Physical Interval Note:  05/05/2018 8:29 AM  Kevin [name]  has presented today for surgery, with the diagnosis of afib  The various methods of treatment have been discussed with the patient and family. After consideration of risks, benefits and other options for treatment, the patient has consented to  Procedure(s): CARDIOVERSION (N/A) as a surgical intervention .  The patient's history has been reviewed, patient examined, no change in status, stable for surgery.  I have reviewed the patient's chart and labs.  Questions were answered to the patient's satisfaction.     [name], MD

## 2018-05-05 NOTE — CV Procedure (Signed)
Electrical Cardioversion Procedure Note Kevin [name] [id number] 13-Jul-1941  Procedure: Electrical Cardioversion Indications:  Atrial Fibrillation  Procedure Details Consent: Risks of procedure as well as the alternatives and risks of each were explained to the (patient/caregiver).  Consent for procedure obtained. Time Out: Verified patient identification, verified procedure, site/side was marked, verified correct patient position, special equipment/implants available, medications/allergies/relevent history reviewed, required imaging and test results available.  Performed  Patient placed on cardiac monitor, pulse oximetry, supplemental oxygen as necessary.  Sedation given: propofol 100 mg Pacer pads placed anterior and posterior chest.  Cardioverted 2 time(s).  Cardioverted at 150J unsuccessful.  200J successful  Evaluation Findings: Post procedure EKG shows: sinus bradycardia with PVCs Complications: None Patient did tolerate procedure well.   Kevin Latch, MD 05/05/2018, 8:50 AM

## 2018-05-05 NOTE — Anesthesia Postprocedure Evaluation (Signed)
Anesthesia Post Note  Patient: [name]  Procedure(s) Performed: CARDIOVERSION (N/A )     Patient location during evaluation: Endoscopy Anesthesia Type: General Level of consciousness: awake Pain management: pain level controlled Vital Signs Assessment: post-procedure vital signs reviewed and stable Respiratory status: spontaneous breathing Cardiovascular status: stable Postop Assessment: no apparent nausea or vomiting Anesthetic complications: no    Last Vitals:  Vitals:   05/05/18 0856 05/05/18 0900  BP: (!) 96/45 (!) 99/42  Pulse: (!) 56 (!) 55  Resp: 20 17  Temp: 36.6 C   SpO2: 99% 96%    Last Pain:  Vitals:   05/05/18 0900  TempSrc:   PainSc: 0-No pain   Pain Goal:                 HATCHETT JR,JOHN FRANKLIN

## 2018-05-05 NOTE — Discharge Instructions (Signed)
Electrical Cardioversion, Care After °This sheet gives you information about how to care for yourself after your procedure. Your health care provider may also give you more specific instructions. If you have problems or questions, contact your health care provider. °What can I expect after the procedure? °After the procedure, it is common to have: °· Some redness on the skin where the shocks were given. ° °Follow these instructions at home: °· Do not drive for 24 hours if you were given a medicine to help you relax (sedative). °· Take over-the-counter and prescription medicines only as told by your health care provider. °· Ask your health care provider how to check your pulse. Check it often. °· Rest for 48 hours after the procedure or as told by your health care provider. °· Avoid or limit your caffeine use as told by your health care provider. °Contact a health care provider if: °· You feel like your heart is beating too quickly or your pulse is not regular. °· You have a serious muscle cramp that does not go away. °Get help right away if: °· You have discomfort in your chest. °· You are dizzy or you feel faint. °· You have trouble breathing or you are short of breath. °· Your speech is slurred. °· You have trouble moving an arm or leg on one side of your body. °· Your fingers or toes turn cold or blue. °This information is not intended to replace advice given to you by your health care provider. Make sure you discuss any questions you have with your health care provider. °Document Released: 07/04/2013 Document Revised: 04/16/2016 Document Reviewed: 03/19/2016 °Elsevier Interactive Patient Education © 2018 [employer] Inc[employer] ° °

## 2018-05-05 NOTE — Anesthesia Procedure Notes (Signed)
Procedure Name: General with mask airway Date/Time: 05/05/2018 8:52 AM Performed by: Imagene Riches, CRNA Pre-anesthesia Checklist: Patient identified, Emergency Drugs available, Suction available, Patient being monitored and Timeout performed Patient Re-evaluated:Patient Re-evaluated prior to induction Oxygen Delivery Method: Ambu bag Preoxygenation: Pre-oxygenation with 100% oxygen Induction Type: IV induction

## 2018-05-06 ENCOUNTER — Encounter (HOSPITAL_COMMUNITY): Payer: Self-pay | Admitting: Emergency Medicine

## 2018-05-06 ENCOUNTER — Emergency Department (HOSPITAL_COMMUNITY): Payer: Medicare Other

## 2018-05-06 ENCOUNTER — Inpatient Hospital Stay (HOSPITAL_COMMUNITY)
Admission: EM | Admit: 2018-05-06 | Discharge: 2018-05-11 | DRG: 239 | Disposition: A | Discharge status: Home / Self Care | Payer: Medicare Other | Attending: Family Medicine | Admitting: Family Medicine

## 2018-05-06 ENCOUNTER — Other Ambulatory Visit: Payer: Self-pay

## 2018-05-06 DIAGNOSIS — I4510 Unspecified right bundle-branch block: Secondary | ICD-10-CM | POA: Diagnosis present

## 2018-05-06 DIAGNOSIS — N289 Disorder of kidney and ureter, unspecified: Secondary | ICD-10-CM | POA: Diagnosis not present

## 2018-05-06 DIAGNOSIS — I480 Paroxysmal atrial fibrillation: Secondary | ICD-10-CM | POA: Diagnosis present

## 2018-05-06 DIAGNOSIS — E1121 Type 2 diabetes mellitus with diabetic nephropathy: Secondary | ICD-10-CM | POA: Diagnosis present

## 2018-05-06 DIAGNOSIS — G629 Polyneuropathy, unspecified: Secondary | ICD-10-CM | POA: Diagnosis present

## 2018-05-06 DIAGNOSIS — R0602 Shortness of breath: Secondary | ICD-10-CM | POA: Diagnosis not present

## 2018-05-06 DIAGNOSIS — E1159 Type 2 diabetes mellitus with other circulatory complications: Secondary | ICD-10-CM | POA: Diagnosis not present

## 2018-05-06 DIAGNOSIS — Z8249 Family history of ischemic heart disease and other diseases of the circulatory system: Secondary | ICD-10-CM

## 2018-05-06 DIAGNOSIS — E1151 Type 2 diabetes mellitus with diabetic peripheral angiopathy without gangrene: Secondary | ICD-10-CM | POA: Diagnosis present

## 2018-05-06 DIAGNOSIS — I509 Heart failure, unspecified: Secondary | ICD-10-CM

## 2018-05-06 DIAGNOSIS — R0603 Acute respiratory distress: Secondary | ICD-10-CM | POA: Diagnosis not present

## 2018-05-06 DIAGNOSIS — I481 Persistent atrial fibrillation: Secondary | ICD-10-CM | POA: Diagnosis present

## 2018-05-06 DIAGNOSIS — R000 Tachycardia, unspecified: Secondary | ICD-10-CM | POA: Diagnosis not present

## 2018-05-06 DIAGNOSIS — I5033 Acute on chronic diastolic (congestive) heart failure: Secondary | ICD-10-CM | POA: Diagnosis present

## 2018-05-06 DIAGNOSIS — Y835 Amputation of limb(s) as the cause of abnormal reaction of the patient, or of later complication, without mention of misadventure at the time of the procedure: Secondary | ICD-10-CM | POA: Diagnosis present

## 2018-05-06 DIAGNOSIS — Z79899 Other long term (current) drug therapy: Secondary | ICD-10-CM

## 2018-05-06 DIAGNOSIS — I110 Hypertensive heart disease with heart failure: Secondary | ICD-10-CM | POA: Diagnosis not present

## 2018-05-06 DIAGNOSIS — Z89432 Acquired absence of left foot: Secondary | ICD-10-CM

## 2018-05-06 DIAGNOSIS — R0689 Other abnormalities of breathing: Secondary | ICD-10-CM | POA: Diagnosis not present

## 2018-05-06 DIAGNOSIS — Z882 Allergy status to sulfonamides status: Secondary | ICD-10-CM

## 2018-05-06 DIAGNOSIS — E1122 Type 2 diabetes mellitus with diabetic chronic kidney disease: Secondary | ICD-10-CM | POA: Diagnosis present

## 2018-05-06 DIAGNOSIS — N400 Enlarged prostate without lower urinary tract symptoms: Secondary | ICD-10-CM | POA: Diagnosis present

## 2018-05-06 DIAGNOSIS — T8781 Dehiscence of amputation stump: Secondary | ICD-10-CM | POA: Diagnosis present

## 2018-05-06 DIAGNOSIS — Z7984 Long term (current) use of oral hypoglycemic drugs: Secondary | ICD-10-CM

## 2018-05-06 DIAGNOSIS — E119 Type 2 diabetes mellitus without complications: Secondary | ICD-10-CM | POA: Diagnosis present

## 2018-05-06 DIAGNOSIS — Z87891 Personal history of nicotine dependence: Secondary | ICD-10-CM

## 2018-05-06 DIAGNOSIS — I130 Hypertensive heart and chronic kidney disease with heart failure and stage 1 through stage 4 chronic kidney disease, or unspecified chronic kidney disease: Principal | ICD-10-CM | POA: Diagnosis present

## 2018-05-06 DIAGNOSIS — I10 Essential (primary) hypertension: Secondary | ICD-10-CM | POA: Diagnosis not present

## 2018-05-06 DIAGNOSIS — D649 Anemia, unspecified: Secondary | ICD-10-CM | POA: Diagnosis present

## 2018-05-06 DIAGNOSIS — Z794 Long term (current) use of insulin: Secondary | ICD-10-CM | POA: Diagnosis present

## 2018-05-06 DIAGNOSIS — Z8673 Personal history of transient ischemic attack (TIA), and cerebral infarction without residual deficits: Secondary | ICD-10-CM

## 2018-05-06 DIAGNOSIS — R0902 Hypoxemia: Secondary | ICD-10-CM | POA: Diagnosis present

## 2018-05-06 DIAGNOSIS — E1169 Type 2 diabetes mellitus with other specified complication: Secondary | ICD-10-CM | POA: Diagnosis present

## 2018-05-06 DIAGNOSIS — N183 Chronic kidney disease, stage 3 (moderate): Secondary | ICD-10-CM | POA: Diagnosis present

## 2018-05-06 DIAGNOSIS — Z7901 Long term (current) use of anticoagulants: Secondary | ICD-10-CM

## 2018-05-06 LAB — HEPATIC FUNCTION PANEL
ALT: 23 U/L (ref 0–44)
AST: 18 U/L (ref 15–41)
Albumin: 3.2 g/dL — ABNORMAL LOW (ref 3.5–5.0)
Alkaline Phosphatase: 95 U/L (ref 38–126)
Bilirubin, Direct: 0.2 mg/dL (ref 0.0–0.2)
Indirect Bilirubin: 0.6 mg/dL (ref 0.3–0.9)
Total Bilirubin: 0.8 mg/dL (ref 0.3–1.2)
Total Protein: 7.2 g/dL (ref 6.5–8.1)

## 2018-05-06 LAB — CBC WITH DIFFERENTIAL/PLATELET
Abs Immature Granulocytes: 0.2 10*3/uL — ABNORMAL HIGH (ref 0.0–0.1)
Abs Immature Granulocytes: 0.1 10*3/uL (ref 0.0–0.1)
Basophils Absolute: 0.1 10*3/uL (ref 0.0–0.1)
Basophils Absolute: 0.1 10*3/uL (ref 0.0–0.1)
Basophils Relative: 0 %
Basophils Relative: 0 %
Eosinophils Absolute: 0.2 10*3/uL (ref 0.0–0.7)
Eosinophils Absolute: 0 10*3/uL (ref 0.0–0.7)
Eosinophils Relative: 1 %
Eosinophils Relative: 0 %
HCT: 31.6 % — ABNORMAL LOW (ref 39.0–52.0)
HCT: 30.7 % — ABNORMAL LOW (ref 39.0–52.0)
Hemoglobin: 9.8 g/dL — ABNORMAL LOW (ref 13.0–17.0)
Hemoglobin: 9.3 g/dL — ABNORMAL LOW (ref 13.0–17.0)
Immature Granulocytes: 1 %
Immature Granulocytes: 1 %
Lymphocytes Relative: 8 %
Lymphocytes Relative: 10 %
Lymphs Abs: 1.6 10*3/uL (ref 0.7–4.0)
Lymphs Abs: 1.6 10*3/uL (ref 0.7–4.0)
MCH: 27.5 pg (ref 26.0–34.0)
MCH: 26.8 pg (ref 26.0–34.0)
MCHC: 31 g/dL (ref 30.0–36.0)
MCHC: 30.3 g/dL (ref 30.0–36.0)
MCV: 88.8 fL (ref 78.0–100.0)
MCV: 88.5 fL (ref 78.0–100.0)
Monocytes Absolute: 1.4 10*3/uL — ABNORMAL HIGH (ref 0.1–1.0)
Monocytes Absolute: 1.1 10*3/uL — ABNORMAL HIGH (ref 0.1–1.0)
Monocytes Relative: 7 %
Monocytes Relative: 7 %
Neutro Abs: 16.7 10*3/uL — ABNORMAL HIGH (ref 1.7–7.7)
Neutro Abs: 12.6 10*3/uL — ABNORMAL HIGH (ref 1.7–7.7)
Neutrophils Relative %: 83 %
Neutrophils Relative %: 82 %
Platelets: 408 10*3/uL — ABNORMAL HIGH (ref 150–400)
Platelets: 372 10*3/uL (ref 150–400)
RBC: 3.56 MIL/uL — ABNORMAL LOW (ref 4.22–5.81)
RBC: 3.47 MIL/uL — ABNORMAL LOW (ref 4.22–5.81)
RDW: 14.6 % (ref 11.5–15.5)
RDW: 14.6 % (ref 11.5–15.5)
WBC: 20.2 10*3/uL — ABNORMAL HIGH (ref 4.0–10.5)
WBC: 15.4 10*3/uL — ABNORMAL HIGH (ref 4.0–10.5)

## 2018-05-06 LAB — TYPE AND SCREEN
ABO/RH(D): O POS
Antibody Screen: NEGATIVE

## 2018-05-06 LAB — COMPREHENSIVE METABOLIC PANEL
ALT: 22 U/L (ref 0–44)
AST: 22 U/L (ref 15–41)
Albumin: 3.2 g/dL — ABNORMAL LOW (ref 3.5–5.0)
Alkaline Phosphatase: 111 U/L (ref 38–126)
Anion gap: 11 (ref 5–15)
BUN: 20 mg/dL (ref 8–23)
CO2: 23 mmol/L (ref 22–32)
Calcium: 8.9 mg/dL (ref 8.9–10.3)
Chloride: 100 mmol/L (ref 98–111)
Creatinine, Ser: 1.47 mg/dL — ABNORMAL HIGH (ref 0.61–1.24)
GFR calc Af Amer: 52 mL/min — ABNORMAL LOW (ref >60)
GFR calc non Af Amer: 45 mL/min — ABNORMAL LOW (ref >60)
Glucose, Bld: 234 mg/dL — ABNORMAL HIGH (ref 70–99)
Potassium: 3.9 mmol/L (ref 3.5–5.1)
Sodium: 134 mmol/L — ABNORMAL LOW (ref 135–145)
Total Bilirubin: 1 mg/dL (ref 0.3–1.2)
Total Protein: 7 g/dL (ref 6.5–8.1)

## 2018-05-06 LAB — ABO/RH: ABO/RH(D): O POS

## 2018-05-06 LAB — BASIC METABOLIC PANEL
Anion gap: 10 (ref 5–15)
BUN: 19 mg/dL (ref 8–23)
CO2: 26 mmol/L (ref 22–32)
Calcium: 8.9 mg/dL (ref 8.9–10.3)
Chloride: 101 mmol/L (ref 98–111)
Creatinine, Ser: 1.53 mg/dL — ABNORMAL HIGH (ref 0.61–1.24)
GFR calc Af Amer: 50 mL/min — ABNORMAL LOW (ref >60)
GFR calc non Af Amer: 43 mL/min — ABNORMAL LOW (ref >60)
Glucose, Bld: 198 mg/dL — ABNORMAL HIGH (ref 70–99)
Potassium: 4 mmol/L (ref 3.5–5.1)
Sodium: 137 mmol/L (ref 135–145)

## 2018-05-06 LAB — GLUCOSE, CAPILLARY
Glucose-Capillary: 170 mg/dL — ABNORMAL HIGH (ref 70–99)
Glucose-Capillary: 77 mg/dL (ref 70–99)
Glucose-Capillary: 173 mg/dL — ABNORMAL HIGH (ref 70–99)
Glucose-Capillary: 193 mg/dL — ABNORMAL HIGH (ref 70–99)

## 2018-05-06 LAB — TROPONIN I
Troponin I: <0.03 ng/mL (ref <0.03)
Troponin I: <0.03 ng/mL (ref <0.03)
Troponin I: <0.03 ng/mL (ref <0.03)

## 2018-05-06 LAB — TSH: TSH: 3.874 u[IU]/mL (ref 0.350–4.500)

## 2018-05-06 LAB — I-STAT TROPONIN, ED: Troponin i, poc: 0.02 ng/mL (ref 0.00–0.08)

## 2018-05-06 LAB — HEPARIN LEVEL (UNFRACTIONATED)
Heparin Unfractionated: 1.98 IU/mL — ABNORMAL HIGH (ref 0.30–0.70)
Heparin Unfractionated: 1.38 IU/mL — ABNORMAL HIGH (ref 0.30–0.70)

## 2018-05-06 LAB — BRAIN NATRIURETIC PEPTIDE: B Natriuretic Peptide: 379.3 pg/mL — ABNORMAL HIGH (ref 0.0–100.0)

## 2018-05-06 LAB — MAGNESIUM: Magnesium: 1.5 mg/dL — ABNORMAL LOW (ref 1.7–2.4)

## 2018-05-06 LAB — APTT
aPTT: 35 seconds (ref 24–36)
aPTT: 45 seconds — ABNORMAL HIGH (ref 24–36)

## 2018-05-06 MED ORDER — INSULIN ASPART 100 UNIT/ML ~~LOC~~ SOLN
0.0000 [IU] | Freq: Three times a day (TID) | SUBCUTANEOUS | Status: DC
  Administered 2018-05-07: 3 [IU] via SUBCUTANEOUS
  Administered 2018-05-07 – 2018-05-08 (×3): 2 [IU] via SUBCUTANEOUS
  Administered 2018-05-08: 3 [IU] via SUBCUTANEOUS
  Administered 2018-05-08 – 2018-05-09 (×2): 2 [IU] via SUBCUTANEOUS
  Administered 2018-05-09: 3 [IU] via SUBCUTANEOUS
  Administered 2018-05-09: 2 [IU] via SUBCUTANEOUS
  Administered 2018-05-10: 9 [IU] via SUBCUTANEOUS
  Administered 2018-05-11: 5 [IU] via SUBCUTANEOUS
  Administered 2018-05-11 (×2): 7 [IU] via SUBCUTANEOUS

## 2018-05-06 MED ORDER — ROSUVASTATIN CALCIUM 10 MG PO TABS
5.0000 mg | ORAL_TABLET | Freq: Every day | ORAL | Status: DC
  Administered 2018-05-06 – 2018-05-11 (×6): 5 mg via ORAL
  Filled 2018-05-06 (×6): qty 1

## 2018-05-06 MED ORDER — ADULT MULTIVITAMIN W/MINERALS CH
1.0000 | ORAL_TABLET | Freq: Every day | ORAL | Status: DC
  Administered 2018-05-06 – 2018-05-11 (×6): 1 via ORAL
  Filled 2018-05-06 (×6): qty 1

## 2018-05-06 MED ORDER — ACETAMINOPHEN 650 MG RE SUPP: 650.0000 mg | Freq: Four times a day (QID) | RECTAL | Status: DC | PRN

## 2018-05-06 MED ORDER — ACETAMINOPHEN 325 MG PO TABS: 650.0000 mg | ORAL_TABLET | Freq: Four times a day (QID) | ORAL | Status: DC | PRN

## 2018-05-06 MED ORDER — FLUTICASONE PROPIONATE 50 MCG/ACT NA SUSP
2.0000 | Freq: Every day | NASAL | Status: DC
  Administered 2018-05-07 – 2018-05-10 (×4): 2 via NASAL
  Filled 2018-05-06: qty 16

## 2018-05-06 MED ORDER — MESALAMINE ER 0.375 G PO CP24
0.7500 g | ORAL_CAPSULE | Freq: Two times a day (BID) | ORAL | Status: DC
  Administered 2018-05-06 – 2018-05-11 (×10): 0.75 g via ORAL
  Filled 2018-05-06 (×11): qty 2

## 2018-05-06 MED ORDER — FUROSEMIDE 10 MG/ML IJ SOLN
40.0000 mg | Freq: Two times a day (BID) | INTRAMUSCULAR | Status: DC
  Administered 2018-05-06 (×2): 40 mg via INTRAVENOUS
  Filled 2018-05-06 (×2): qty 4

## 2018-05-06 MED ORDER — ONDANSETRON HCL 4 MG/2ML IJ SOLN
4.0000 mg | Freq: Four times a day (QID) | INTRAMUSCULAR | Status: DC | PRN
  Administered 2018-05-10: 4 mg via INTRAVENOUS

## 2018-05-06 MED ORDER — TAMSULOSIN HCL 0.4 MG PO CAPS
0.4000 mg | ORAL_CAPSULE | Freq: Two times a day (BID) | ORAL | Status: DC
  Administered 2018-05-06 – 2018-05-11 (×11): 0.4 mg via ORAL
  Filled 2018-05-06 (×11): qty 1

## 2018-05-06 MED ORDER — INSULIN ASPART 100 UNIT/ML ~~LOC~~ SOLN
0.0000 [IU] | Freq: Three times a day (TID) | SUBCUTANEOUS | Status: DC
  Administered 2018-05-06 (×2): 2 [IU] via SUBCUTANEOUS

## 2018-05-06 MED ORDER — HEPARIN (PORCINE) IN NACL 100-0.45 UNIT/ML-% IJ SOLN
1400.0000 [IU]/h | INTRAMUSCULAR | Status: DC
  Administered 2018-05-06: 1400 [IU]/h via INTRAVENOUS
  Filled 2018-05-06: qty 250

## 2018-05-06 MED ORDER — FUROSEMIDE 10 MG/ML IJ SOLN
40.0000 mg | Freq: Two times a day (BID) | INTRAMUSCULAR | Status: DC
  Administered 2018-05-07 – 2018-05-09 (×5): 40 mg via INTRAVENOUS
  Filled 2018-05-06 (×5): qty 4

## 2018-05-06 MED ORDER — LINAGLIPTIN 5 MG PO TABS
5.0000 mg | ORAL_TABLET | Freq: Every day | ORAL | Status: DC
  Administered 2018-05-06 – 2018-05-11 (×6): 5 mg via ORAL
  Filled 2018-05-06 (×6): qty 1

## 2018-05-06 MED ORDER — FLUOXETINE HCL 20 MG PO CAPS
20.0000 mg | ORAL_CAPSULE | Freq: Every day | ORAL | Status: DC
  Administered 2018-05-06 – 2018-05-11 (×6): 20 mg via ORAL
  Filled 2018-05-06 (×6): qty 1

## 2018-05-06 MED ORDER — APIXABAN 5 MG PO TABS: 5.0000 mg | ORAL_TABLET | Freq: Two times a day (BID) | ORAL | Status: DC

## 2018-05-06 MED ORDER — ASPIRIN 81 MG PO CHEW
324.0000 mg | CHEWABLE_TABLET | Freq: Once | ORAL | Status: AC
  Administered 2018-05-06: 324 mg via ORAL
  Filled 2018-05-06: qty 4

## 2018-05-06 MED ORDER — FUROSEMIDE 10 MG/ML IJ SOLN
40.0000 mg | Freq: Once | INTRAMUSCULAR | Status: AC
  Administered 2018-05-06: 40 mg via INTRAVENOUS
  Filled 2018-05-06: qty 4

## 2018-05-06 MED ORDER — METOPROLOL SUCCINATE ER 100 MG PO TB24
200.0000 mg | ORAL_TABLET | Freq: Every day | ORAL | Status: DC
  Administered 2018-05-06 – 2018-05-11 (×6): 200 mg via ORAL
  Filled 2018-05-06 (×6): qty 2

## 2018-05-06 MED ORDER — DILTIAZEM HCL ER COATED BEADS 240 MG PO CP24
240.0000 mg | ORAL_CAPSULE | Freq: Every day | ORAL | Status: DC
  Administered 2018-05-06 – 2018-05-11 (×6): 240 mg via ORAL
  Filled 2018-05-06 (×6): qty 1

## 2018-05-06 MED ORDER — ONDANSETRON HCL 4 MG PO TABS: 4.0000 mg | ORAL_TABLET | Freq: Four times a day (QID) | ORAL | Status: DC | PRN

## 2018-05-06 NOTE — Progress Notes (Addendum)
ANTICOAGULATION CONSULT NOTE - Initial Consult  Pharmacy Consult for Heparin Indication: atrial fibrillation  Allergies  Allergen Reactions  . Sulfa Antibiotics Rash    Patient Measurements: Height: 6\' 1"  (185.4 cm) Weight: 210 lb 12.8 oz (95.6 kg) IBW/kg (Calculated) : 79.9 Heparin Dosing Weight: 94.3 kg  Vital Signs: Temp: 97.9 F (36.6 C) (08/10 0748) Temp Source: Oral (08/10 0748) BP: 111/59 (08/10 0748) Pulse Rate: 68 (08/10 0748)  Labs: Recent Labs    05/03/18 1301 05/06/18 0328 05/06/18 0618  HGB  --  9.8* 9.3*  HCT  --  31.6* 30.7*  PLT  --  408* 372  CREATININE 1.37* 1.47* 1.53*  TROPONINI  --   --  <0.03    Estimated Creatinine Clearance: 47.9 mL/min (A) (by C-G formula based on SCr of 1.53 mg/dL (H)).   Medical History: Past Medical History:  Diagnosis Date  . A-fib ([place]) 03/20/2018  . Acute blood loss anemia   . Benign essential HTN   . Benign prostatic hyperplasia with urinary retention   . Cerebrovascular accident (CVA) ([place])   . CKD (chronic kidney disease), stage III ([place])   . Diabetes ([place]) 03/20/2018  . Diabetes mellitus type 2 in nonobese (HCC)   . Essential hypertension 03/20/2018  . Former tobacco use   . H/O ulcerative colitis 1993  . Hypertension   . Hypokalemia   . Leukocytosis   . New onset atrial fibrillation ([place])   . Osteomyelitis ([place])    a. 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  [name] (paroxysmal atrial fibrillation) ([place])    a. dx 03/2018 in setting of stroke, osteomyelitis.  . Sepsis ([place]) 03/20/2018  . Stage 3 chronic kidney disease ([place])    pt/family unaware  . 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 Stroke ([place]) 03/2018  . Stroke-like episode ([place]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 03/16/2018  . Tachycardia 03/20/2018  . Wrist fracture    Left   Assessment: 77 YO M presenting with c/o of SOB. On Eliquis PTA for Afib now held d/t bleed from left foot stump which has stopped with pressure. Foot is wrapped with no apparent  bleeding at this time.   H/H low but stable, plts stable.  Goal of Therapy:  Heparin level 0.3-0.7 units/ml Monitor platelets by anticoagulation protocol: Yes   Plan:  Start heparin infusion at 1400 units/hr  Will not administer bolus d/t recent bleed  Obtain baseline aPTT and heparin level Check heparin level in 6 hours Monitor daily CBC, heparin level, s/sx of bleeding F/u plans for anticoagulation at discharge    Juanell Fairly, PharmD PGY1 Pharmacy Resident Phone [phone] 05/06/2018 9:12 AM   I discussed / reviewed the pharmacy note by Dr. Wilkie Aye and I agree with the resident's findings and plans as documented. Also discussed recent CVA with TPA administration. Crystal S. Alford Highland, PharmD, [place] Clinical Staff Pharmacist Pager [phone]

## 2018-05-06 NOTE — Progress Notes (Signed)
Patient arrived via EMS on CPAP. Placed on BIPAP for increased WOB and SOB.

## 2018-05-06 NOTE — Progress Notes (Addendum)
ANTICOAGULATION CONSULT NOTE - Initial Consult  Pharmacy Consult for Heparin Indication: atrial fibrillation  Allergies  Allergen Reactions  . Sulfa Antibiotics Rash    Patient Measurements: Height: 6\' 1"  (185.4 cm) Weight: 210 lb 12.8 oz (95.6 kg) IBW/kg (Calculated) : 79.9 Heparin Dosing Weight: 94.3 kg  Vital Signs: Temp: 98.4 F (36.9 C) (08/10 1549) Temp Source: Oral (08/10 1549) BP: 106/62 (08/10 1549) Pulse Rate: 56 (08/10 1549)  Labs: Recent Labs    05/06/18 0328 05/06/18 0618 05/06/18 0912 05/06/18 1134 05/06/18 1535 05/06/18 1550  HGB 9.8* 9.3*  --   --   --   --   HCT 31.6* 30.7*  --   --   --   --   PLT 408* 372  --   --   --   --   APTT  --   --  35  --   --  45*  HEPARINUNFRC  --   --  1.98*  --  1.38*  --   CREATININE 1.47* 1.53*  --   --   --   --   TROPONINI  --  <0.03  --  <0.03  --   --     Estimated Creatinine Clearance: 47.9 mL/min (A) (by C-G formula based on SCr of 1.53 mg/dL (H)).   Medical History: Past Medical History:  Diagnosis Date  . A-fib ([place]) 03/20/2018  . Acute blood loss anemia   . Benign essential HTN   . Benign prostatic hyperplasia with urinary retention   . Cerebrovascular accident (CVA) ([place])   . CKD (chronic kidney disease), stage III ([place])   . Diabetes ([place]) 03/20/2018  . Diabetes mellitus type 2 in nonobese (HCC)   . Essential hypertension 03/20/2018  . Former tobacco use   . H/O ulcerative colitis 1993  . Hypertension   . Hypokalemia   . Leukocytosis   . New onset atrial fibrillation ([place])   . Osteomyelitis ([place])    a. 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  [name] (paroxysmal atrial fibrillation) ([place])    a. dx 03/2018 in setting of stroke, osteomyelitis.  . Sepsis ([place]) 03/20/2018  . Stage 3 chronic kidney disease ([place])    pt/family unaware  . 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 Stroke ([place]) 03/2018  . Stroke-like episode ([place]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 03/16/2018  . Tachycardia 03/20/2018  . Wrist  fracture    Left   Assessment: 77 YO M presenting with c/o of SOB. On Eliquis PTA for Afib now held d/t bleed from left foot stump which has stopped with pressure. Foot is wrapped with no apparent bleeding at this time.   H/H low but stable, plts stable.  Heparin level still elevated this PM due to apixaban. PTT level also came back low. RN stated that heparin has been running fine. However, the nurse said that the bleeding is worse when dressing was removed. Dr. Vista Lawman is ok with holding heparin and re-assess tomorrow.   Goal of Therapy:  Heparin level 0.3-0.7 units/ml Monitor platelets by anticoagulation protocol: Yes   Plan:  Hold heparin F/u with resumption tomorrow Monitor daily CBC, heparin level, PTT, s/sx of bleeding F/u plans for anticoagulation at discharge    Onnie Boer, PharmD, BCIDP, AAHIVP, CPP Infectious Disease Pharmacist Pager: [phone] 05/06/2018 6:10 PM

## 2018-05-06 NOTE — H&P (Addendum)
History and Physical    Clemens Lachman IZT:[id number] DOB: December 19, 1940 DOA: 05/06/2018  PCP: [name], MD  Patient coming from: Home.  Chief Complaint: Shortness of breath.  HPI: Kevin [name] is a 77 y.o. male with history of atrial fibrillation status post recent cardioversion a week ago, admitted last month for strokelike symptoms with left-sided hemiplegia which has improved, history of osteomyelitis status post left-sided transmetatarsal amputation who was recently placed on Lasix after patient was found to have congestion presents to the ER with sudden onset of shortness of breath which woke him up from sleep this morning around 1 AM.  Denies any chest pain productive cough fever or chills.  ED Course: In the ER patient was hypoxic and had to be placed on BiPAP.  Patient was given Lasix 40 mg IV following which BiPAP was weaned off but still mildly hypoxic.  Chest x-ray shows congestive pattern BNP was 379.  Patient admitted for further management of acute CHF.  Review of Systems: As per HPI, rest all negative.   Past Medical History:  Diagnosis Date  . A-fib ([place]) 03/20/2018  . Acute blood loss anemia   . Benign essential HTN   . Benign prostatic hyperplasia with urinary retention   . Cerebrovascular accident (CVA) ([place])   . CKD (chronic kidney disease), stage III ([place])   . Diabetes ([place]) 03/20/2018  . Diabetes mellitus type 2 in nonobese (HCC)   . Essential hypertension 03/20/2018  . Former tobacco use   . H/O ulcerative colitis 1993  . Hypertension   . Hypokalemia   . Leukocytosis   . New onset atrial fibrillation ([place])   . Osteomyelitis ([place])    a. 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  [name] (paroxysmal atrial fibrillation) ([place])    a. dx 03/2018 in setting of stroke, osteomyelitis.  . Sepsis ([place]) 03/20/2018  . Stage 3 chronic kidney disease ([place])    pt/family unaware  . Status post transmetatarsal amputation of left foot ([place])   . Stroke ([place])  03/2018  . Stroke-like episode ([place]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 03/16/2018  . Tachycardia 03/20/2018  . Wrist fracture    Left    Past Surgical History:  Procedure Laterality Date  . AMPUTATION Left 03/24/2018   Procedure: Transmetatarsal Amputation Left Foot;  Surgeon: [name], MD;  Location: [facility];  Service: Orthopedics;  Laterality: Left;  . CARDIOVERSION N/A 05/05/2018   Procedure: CARDIOVERSION;  Surgeon: [name], MD;  Location: [facility] ENDOSCOPY;  Service: Cardiovascular;  Laterality: N/A;  . COLONOSCOPY  12/2017   benign, remission from ulcerative colitis in 90s  . I&D EXTREMITY Left 03/17/2018   Procedure: IRRIGATION AND DEBRIDEMENT FOOT;  Surgeon: [name], MD;  Location: [facility];  Service: Orthopedics;  Laterality: Left;     reports that he quit smoking about 34 years ago. His smoking use included cigarettes. He started smoking about 56 years ago. He smoked 1.00 pack per day. He has never used smokeless tobacco. He reports that he drinks about 4.0 standard drinks of alcohol per week. He reports that he does not use drugs.  Allergies  Allergen Reactions  . Sulfa Antibiotics Rash    Family History  Problem Relation Age of Onset  . Hypertension Father     Prior to Admission medications   Medication Sig Start Date End Date Taking? Authorizing Provider  apixaban (ELIQUIS) 5 MG TABS tablet Take 1 tablet (5 mg total) by mouth 2 (two) times daily. 04/27/18  Yes Dunn,  Dayna N, PA-C  diltiazem (CARDIZEM CD) 240 MG 24 hr capsule Take 1 capsule (240 mg total) by mouth daily. 04/27/18  Yes Dunn, Dayna N, PA-C  FLUoxetine (PROZAC) 20 MG capsule Take 20 mg by mouth daily.   Yes [provider]  fluticasone (FLONASE) 50 MCG/ACT nasal spray Place 2 sprays into both nostrils at bedtime.   Yes [provider]  furosemide (LASIX) 20 MG tablet Take 1 tablet (20 mg total) by mouth daily. 04/28/18 07/27/18 Yes Dunn, Dayna N, PA-C  glimepiride (AMARYL) 4 MG tablet Take 4 mg by  mouth daily with breakfast.   Yes [provider]  mesalamine (APRISO) 0.375 g 24 hr capsule Take 750 mg by mouth 2 (two) times daily.    Yes [provider]  metFORMIN (GLUCOPHAGE) 500 MG tablet Take 1,000 mg by mouth 2 (two) times daily with a meal.    Yes [provider]  metoprolol succinate (TOPROL-XL) 100 MG 24 hr tablet Take 2 tablets (200 mg total) by mouth daily. Take with or immediately following a meal. 04/27/18 07/26/18 Yes Dunn, Dayna N, PA-C  Multiple Vitamin (MULTIVITAMIN WITH MINERALS) TABS tablet Take 1 tablet by mouth daily.   Yes [provider]  mupirocin ointment (BACTROBAN) 2 % Apply 1 application topically daily.   Yes [provider]  rosuvastatin (CRESTOR) 10 MG tablet Take 0.5 tablets (5 mg total) by mouth daily. 03/31/18  Yes Love, Ivan Anchors, PA-C  sitaGLIPtin (JANUVIA) 100 MG tablet Take 100 mg by mouth daily.   Yes [provider]  tamsulosin (FLOMAX) 0.4 MG CAPS capsule Take 1 capsule (0.4 mg total) by mouth 2 times daily at 12 noon and 4 pm. 03/31/18  Yes Bary Leriche, [place]    Physical Exam: Vitals:   05/06/18 0415 05/06/18 0515 05/06/18 0524 05/06/18 0600  BP: (!) 119/56 125/65  (!) 127/56  Pulse: 71 77  77  Resp:  (!) 21    Temp:    97.9 F (36.6 C)  TempSrc:    Oral  SpO2: 98% 94% 94% 97%  Weight:    95.6 kg  Height:          Constitutional: Moderately built and nourished. Vitals:   05/06/18 0415 05/06/18 0515 05/06/18 0524 05/06/18 0600  BP: (!) 119/56 125/65  (!) 127/56  Pulse: 71 77  77  Resp:  (!) 21    Temp:    97.9 F (36.6 C)  TempSrc:    Oral  SpO2: 98% 94% 94% 97%  Weight:    95.6 kg  Height:       Eyes: Anicteric no pallor. ENMT: No discharge from the ears eyes nose or mouth. Neck: No mass felt.  JVD elevated. Respiratory: Mild expiratory wheeze no crepitations. Cardiovascular: S1-S2 heard no murmurs appreciated. Abdomen: Soft nontender bowel sounds present. Musculoskeletal: No  edema. Skin: Left foot dressing. Neurologic: Alert awake oriented to time place and person.  Moves all extremities. Psychiatric: Appears normal per normal affect.   Labs on Admission: I have personally reviewed following labs and imaging studies  CBC: Recent Labs  Lab 05/06/18 0328  WBC 20.2*  NEUTROABS 16.7*  HGB 9.8*  HCT 31.6*  MCV 88.8  PLT 188*   Basic Metabolic Panel: Recent Labs  Lab 05/03/18 1301 05/06/18 0328  NA 140 134*  K 4.3 3.9  CL 98 100  CO2 26 23  GLUCOSE 161* 234*  BUN 18 20  CREATININE 1.37* 1.47*  CALCIUM 9.5 8.9  GFR: Estimated Creatinine Clearance: 49.8 mL/min (A) (by C-G formula based on SCr of 1.47 mg/dL (H)). Liver Function Tests: Recent Labs  Lab 05/06/18 0328  AST 22  ALT 22  ALKPHOS 111  BILITOT 1.0  PROT 7.0  ALBUMIN 3.2*   No results for input(s): LIPASE, AMYLASE in the last 168 hours. No results for input(s): AMMONIA in the last 168 hours. Coagulation Profile: No results for input(s): INR, PROTIME in the last 168 hours. Cardiac Enzymes: No results for input(s): CKTOTAL, CKMB, CKMBINDEX, TROPONINI in the last 168 hours. BNP (last 3 results) Recent Labs    04/27/18 1236  PROBNP 3,814*   HbA1C: No results for input(s): HGBA1C in the last 72 hours. CBG: Recent Labs  Lab 05/05/18 0714  GLUCAP 164*   Lipid Profile: No results for input(s): CHOL, HDL, LDLCALC, TRIG, CHOLHDL, LDLDIRECT in the last 72 hours. Thyroid Function Tests: No results for input(s): TSH, T4TOTAL, FREET4, T3FREE, THYROIDAB in the last 72 hours. Anemia Panel: No results for input(s): VITAMINB12, FOLATE, FERRITIN, TIBC, IRON, RETICCTPCT in the last 72 hours. Urine analysis:    Component Value Date/Time   COLORURINE YELLOW 03/16/2018 2125   APPEARANCEUR HAZY (A) 03/16/2018 2125   LABSPEC 1.011 03/16/2018 2125   PHURINE 6.0 03/16/2018 2125   GLUCOSEU >=500 (A) 03/16/2018 2125   HGBUR LARGE (A) 03/16/2018 2125   BILIRUBINUR NEGATIVE 03/16/2018  2125   KETONESUR 5 (A) 03/16/2018 2125   PROTEINUR 30 (A) 03/16/2018 2125   NITRITE NEGATIVE 03/16/2018 2125   LEUKOCYTESUR LARGE (A) 03/16/2018 2125   Sepsis Labs: @LABRCNTIP (procalcitonin:4,lacticidven:4) )No results found for this or any previous visit (from the past 240 hour(s)).   Radiological Exams on Admission: Dg Chest Port 1 View  Result Date: 05/06/2018 CLINICAL DATA:  Acute onset of respiratory distress. Shortness of breath. EXAM: PORTABLE CHEST 1 VIEW COMPARISON:  Chest radiograph performed 04/27/2018 FINDINGS: The lungs are well-aerated. Bilateral midlung and bibasilar airspace opacities raise concern for pulmonary edema, with a small left pleural effusion. No pneumothorax is seen. Mild vascular congestion is noted. The cardiomediastinal silhouette is within normal limits. No acute osseous abnormalities are seen. IMPRESSION: Bilateral midlung and bibasilar airspace opacities raise concern for pulmonary edema, with a small left pleural effusion, mildly worsened from the prior study. Mild vascular congestion noted. Electronically Signed   By: Garald Balding M.D.   On: 05/06/2018 03:46    EKG: Independently reviewed.  Normal sinus rhythm.  Assessment/Plan Active Problems:   Essential hypertension   Paroxysmal atrial fibrillation (HCC)   Status post transmetatarsal amputation of left foot (HCC)   Acute on chronic congestive heart failure (HCC)   Renal insufficiency   Type 2 diabetes mellitus with vascular disease ([place])    1. Acute on chronic diastolic CHF last EF measured in June 2019 was 60 to 65%.  Patient has improved after Lasix was given and has been weaned off BiPAP.  Still hypoxic for which I have ordered Lasix 40 mg IV every 12.  Closely follow intake output metabolic panel cardiac markers. 2. Atrial fibrillation status post cardioversion presently in sinus rhythm last week.  On apixaban metoprolol Cardizem. 3. Chronic renal disease stage III creatinine appears to be at  baseline.  Follow metabolic panel. 4. Diabetes mellitus type 2 we will keep patient on sliding scale coverage and Januvia.  Hold glimepiride and metformin. 5. Recent stroke on apixaban.  Had received TPA at that time. 6. Left foot transmetatarsal amputation last month for osteomyelitis.  Had visited Dr.  Sharol Given last week prior to cardioversion and had dressing change and patient states that clot was removed. 7. Chronic anemia follow CBC.  Addendum -patient had some bleeding from the left foot stump which did stop eventually with pressure.  Will hold apixaban and keep patient on heparin until we make sure there is no further bleeding.  DVT prophylaxis: Apixaban. Code Status: Full code. Family Communication: Discussed with patient. Disposition Plan: Home. Consults called: None. Admission status: Observation.   Rise Patience MD [facility] Pager [phone].  If 7PM-7AM, please contact night-coverage [email or web address] Password [email or web address]  05/06/2018, 6:12 AM

## 2018-05-06 NOTE — ED Provider Notes (Signed)
[facility] EMERGENCY DEPARTMENT Provider Note   CSN: [id number] Arrival date & time: 05/06/18  0302     History   Chief Complaint Chief Complaint  Patient presents with  . Shortness of Breath    HPI Kevin [name] is a 77 y.o. male.  The history is provided by the patient.  He has history of hypertension, diabetes, paroxysmal atrial fibrillation, heart failure, stroke and comes in with a difficulty breathing.  He has been having some difficulty breathing for about the last 2 weeks, but it got much worse at 1:30 AM.  Nothing made his symptoms better, nothing made them worse.  EMS arrived and noted hypoxia and started him on CPAP with significant improvement.  He does state that he had a chest x-ray about a week ago and was told for the first time that he had heart failure.  He has been taking furosemide 20 mg a day.  Also, he had elective cardioversion for atrial fibrillation done yesterday.  He denies chest pain, heaviness, tightness, pressure.  Also, he recently had a midfoot amputation on the left and the incision had opened up.  His orthopedic physician had debrided the area and pulled out a clot.  He is currently anticoagulated on apixaban.  Past Medical History:  Diagnosis Date  . A-fib ([place]) 03/20/2018  . Acute blood loss anemia   . Benign essential HTN   . Benign prostatic hyperplasia with urinary retention   . Cerebrovascular accident (CVA) ([place])   . CKD (chronic kidney disease), stage III ([place])   . Diabetes ([place]) 03/20/2018  . Diabetes mellitus type 2 in nonobese (HCC)   . Essential hypertension 03/20/2018  . Former tobacco use   . H/O ulcerative colitis 1993  . Hypertension   . Hypokalemia   . Leukocytosis   . New onset atrial fibrillation ([place])   . Osteomyelitis ([place])    a. 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  [name] (paroxysmal atrial fibrillation) ([place])    a. dx 03/2018 in setting of stroke, osteomyelitis.  . Sepsis ([place]) 03/20/2018  . Stage  3 chronic kidney disease ([place])    pt/family unaware  . Status post transmetatarsal amputation of left foot ([place])   . Stroke ([place]) 03/2018  . Stroke-like episode ([place]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 03/16/2018  . Tachycardia 03/20/2018  . Wrist fracture    Left    Patient Active Problem List   Diagnosis Date Noted  . Debility 03/31/2018  . Urinary retention with incomplete bladder emptying 03/31/2018  . History of transmetatarsal amputation of left foot ([place])   . 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 ([place])   . Status post transmetatarsal amputation of left foot ([place])   . Postoperative pain   . Benign prostatic hyperplasia with urinary retention   . New onset atrial fibrillation ([place])   . Incontinence of feces   . Cerebrovascular accident (CVA) ([place])   . Benign essential HTN   . Diabetes mellitus type 2 in nonobese (HCC)   . Hypokalemia   . PAF (paroxysmal atrial fibrillation) ([place])   . Leukocytosis   . SIRS (systemic inflammatory response syndrome) (HCC)   . Acute blood loss anemia   . Stage 3 chronic kidney disease ([place])   . Essential hypertension 03/20/2018  . Tachycardia 03/20/2018  . Sepsis ([place]) 03/20/2018  . Diabetes ([place]) 03/20/2018  . Subacute osteomyelitis, left ankle and foot ([place]) 03/20/2018  . A-fib ([place]) 03/20/2018  . Stroke-like episode ([place]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 03/16/2018  . Strain  of left tibialis anterior muscle 12/19/2015  . 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 Fracture of radius, distal, left, closed 12/19/2012    Past Surgical History:  Procedure Laterality Date  . AMPUTATION Left 03/24/2018   Procedure: Transmetatarsal Amputation Left Foot;  Surgeon: [name], MD;  Location: [place] [facility];  Service: Orthopedics;  Laterality: Left;  . CARDIOVERSION N/A 05/05/2018   Procedure: CARDIOVERSION;  Surgeon: [name], MD;  Location: [facility] ENDOSCOPY;  Service: Cardiovascular;  Laterality: N/A;  . COLONOSCOPY  12/2017   benign, remission from ulcerative colitis  in 90s  . I&D EXTREMITY Left 03/17/2018   Procedure: IRRIGATION AND DEBRIDEMENT FOOT;  Surgeon: [name], MD;  Location: [facility];  Service: Orthopedics;  Laterality: Left;        Home Medications    Prior to Admission medications   Medication Sig Start Date End Date Taking? Authorizing Provider  apixaban (ELIQUIS) 5 MG TABS tablet Take 1 tablet (5 mg total) by mouth 2 (two) times daily. 04/27/18   Dunn, Nedra Hai, PA-C  diltiazem (CARDIZEM CD) 240 MG 24 hr capsule Take 1 capsule (240 mg total) by mouth daily. 04/27/18   Dunn, Nedra Hai, PA-C  FLUoxetine (PROZAC) 20 MG capsule Take 20 mg by mouth daily.    [provider]  fluticasone (FLONASE) 50 MCG/ACT nasal spray Place 2 sprays into both nostrils at bedtime.    [provider]  furosemide (LASIX) 20 MG tablet Take 1 tablet (20 mg total) by mouth daily. 04/28/18 07/27/18  Dunn, Nedra Hai, PA-C  glimepiride (AMARYL) 4 MG tablet Take 4 mg by mouth daily with breakfast.    [provider]  mesalamine (APRISO) 0.375 g 24 hr capsule Take 750 mg by mouth 2 (two) times daily.     [provider]  metFORMIN (GLUCOPHAGE) 500 MG tablet Take 1,000 mg by mouth 2 (two) times daily with a meal.     [provider]  metoprolol succinate (TOPROL-XL) 100 MG 24 hr tablet Take 2 tablets (200 mg total) by mouth daily. Take with or immediately following a meal. 04/27/18 07/26/18  Dunn, Nedra Hai, PA-C  Multiple Vitamin (MULTIVITAMIN WITH MINERALS) TABS tablet Take 1 tablet by mouth daily.    [provider]  mupirocin ointment (BACTROBAN) 2 % Apply 1 application topically daily.    [provider]  rosuvastatin (CRESTOR) 10 MG tablet Take 0.5 tablets (5 mg total) by mouth daily. 03/31/18   Love, Ivan Anchors, PA-C  sitaGLIPtin (JANUVIA) 100 MG tablet Take 100 mg by mouth daily.    [provider]  tamsulosin (FLOMAX) 0.4 MG CAPS capsule Take 1 capsule (0.4 mg total) by mouth 2 times daily at 12 noon  and 4 pm. 03/31/18   Love, Ivan Anchors, PA-C    Family History Family History  Problem Relation Age of Onset  . Hypertension Father     Social History Social History   Tobacco Use  . Smoking status: Former Smoker    Packs/day: 1.00    Types: Cigarettes    Start date: 1963    Last attempt to quit: 1985    Years since quitting: 34.6  . Smokeless tobacco: Never Used  Substance Use Topics  . Alcohol use: Yes    Alcohol/week: 4.0 standard drinks    Types: 4 Cans of beer per week    Comment: social   . Drug use: Never     Allergies   Sulfa antibiotics   Review of  Systems Review of Systems  All other systems reviewed and are negative.    Physical Exam Updated Vital Signs BP 117/60 (BP Location: Left Arm)   Pulse 75   Temp 98.2 F (36.8 C) (Oral)   Resp 20   Ht 6\' 1"  (1.854 m)   Wt 94.3 kg   SpO2 99%   BMI 27.44 kg/m   Physical Exam  Nursing note and vitals reviewed.  77 year old male, resting comfortably and in no acute distress. Vital signs are normal. Oxygen saturation is 99%, which is normal.  He is currently on BiPAP. Head is normocephalic and atraumatic. PERRLA, EOMI. Oropharynx is clear. Neck is nontender and supple without adenopathy or JVD. Back is nontender and there is no CVA tenderness. Lungs fine bibasilar rales without wheezes or rhonchi. Chest is nontender. Heart has regular rate and rhythm without murmur. Abdomen is soft, flat, nontender without masses or hepatosplenomegaly and peristalsis is normoactive. Extremities have no cyanosis or edema, full range of motion is present. Skin is warm and dry without rash. Neurologic: Mental status is normal, cranial nerves are intact, there are no motor or sensory deficits.  ED Treatments / Results  Labs (all labs ordered are listed, but only abnormal results are displayed) Labs Reviewed  COMPREHENSIVE METABOLIC PANEL - Abnormal; Notable for the following components:      Result Value   Sodium 134 (*)      Glucose, Bld 234 (*)    Creatinine, Ser 1.47 (*)    Albumin 3.2 (*)    GFR calc non Af Amer 45 (*)    GFR calc Af Amer 52 (*)    All other components within normal limits  BRAIN NATRIURETIC PEPTIDE - Abnormal; Notable for the following components:   B Natriuretic Peptide 379.3 (*)    All other components within normal limits  CBC WITH DIFFERENTIAL/PLATELET - Abnormal; Notable for the following components:   WBC 20.2 (*)    RBC 3.56 (*)    Hemoglobin 9.8 (*)    HCT 31.6 (*)    Platelets 408 (*)    Neutro Abs 16.7 (*)    Monocytes Absolute 1.4 (*)    Abs Immature Granulocytes 0.2 (*)    All other components within normal limits  I-STAT TROPONIN, ED    EKG EKG Interpretation  Date/Time:  Saturday May 06 2018 03:15:56 EDT Ventricular Rate:  74 PR Interval:    QRS Duration: 95 QT Interval:  393 QTC Calculation: 436 R Axis:   57 Text Interpretation:  Sinus rhythm Normal ECG When compared with ECG of 05/05/2018, No significant change was found Confirmed by Delora Fuel ([id number]) on 05/06/2018 3:21:50 AM   Radiology Dg Chest Port 1 View  Result Date: 05/06/2018 CLINICAL DATA:  Acute onset of respiratory distress. Shortness of breath. EXAM: PORTABLE CHEST 1 VIEW COMPARISON:  Chest radiograph performed 04/27/2018 FINDINGS: The lungs are well-aerated. Bilateral midlung and bibasilar airspace opacities raise concern for pulmonary edema, with a small left pleural effusion. No pneumothorax is seen. Mild vascular congestion is noted. The cardiomediastinal silhouette is within normal limits. No acute osseous abnormalities are seen. IMPRESSION: Bilateral midlung and bibasilar airspace opacities raise concern for pulmonary edema, with a small left pleural effusion, mildly worsened from the prior study. Mild vascular congestion noted. Electronically Signed   By: Garald Balding M.D.   On: 05/06/2018 03:46    Procedures Procedures  CRITICAL CARE Performed by: Delora Fuel Total critical care  time: 40 minutes Critical care time  was exclusive of separately billable procedures and treating other patients. Critical care was necessary to treat or prevent imminent or life-threatening deterioration. Critical care was time spent personally by me on the following activities: development of treatment plan with patient and/or surrogate as well as nursing, discussions with consultants, evaluation of patient's response to treatment, examination of patient, obtaining history from patient or surrogate, ordering and performing treatments and interventions, ordering and review of laboratory studies, ordering and review of radiographic studies, pulse oximetry and re-evaluation of patient's condition.  Medications Ordered in ED Medications  aspirin chewable tablet 324 mg (has no administration in time range)  furosemide (LASIX) injection 40 mg (40 mg Intravenous Given 05/06/18 0418)     Initial Impression / Assessment and Plan / ED Course  I have reviewed the triage vital signs and the nursing notes.  Pertinent labs & imaging results that were available during my care of the patient were reviewed by me and considered in my medical decision making (see chart for details).  CHF exacerbation.  Old records are reviewed, and he did have a markedly elevated BNP and chest x-ray consistent with heart failure on August 1.  Will check chest x-ray, metabolic panel, troponin, BNP.  He is given a dose of furosemide intravenously, and a dose of oral aspirin.  Laboratory work-up shows renal insufficiency which is not significantly changed from baseline.  BNP is elevated, consistent with CHF exacerbation.  He is resting comfortably and I feel he can safely be taken off of BiPAP at this point.  Case is discussed with Dr Hal Hope of [facility], who agrees to admit the patient.  Final Clinical Impressions(s) / ED Diagnoses   Final diagnoses:  Acute on chronic congestive heart failure, unspecified heart  failure type [facility])  Renal insufficiency  Paroxysmal atrial fibrillation [facility])    ED Discharge Orders    None       Delora Fuel, MD 88/71/95 [phone]

## 2018-05-06 NOTE — Progress Notes (Addendum)
Informed Kevin [name] about pt having increased bleeding from left foot.  Pharmacist Kevin said he was calling to call to the physician about what to about the heparin gtt.  Irven [place], RN

## 2018-05-06 NOTE — Progress Notes (Signed)
Seen this morning post admit by Dr Hal Hope. No ongoing bleeding noted.   a 77 year old gentleman with medical history significant for but not limited to atrial fibrillation, previous CVA, diabetes mellitus, CKD 3, and left foot osteomyelitis status post left-sided transmetatarsal amputation presenting will 1 day history of shortness of breath that woke him up from sleep early this morning.  ED evaluation noted elevated BNP of 379 with CHF and x-ray and hypoxia and required IV diuretics with BiPAP treatment in the ED with improvement and admitted for  Acute on chronic CHF.   Agree with diuretics monitor electrolytes and renal function and holding apixaban for now due to high risk of rebleeding.

## 2018-05-06 NOTE — Progress Notes (Signed)
  Trial off BIPAP per MD. Currently on RA.

## 2018-05-06 NOTE — ED Notes (Signed)
ED Provider at bedside. Roxanne Mins at bedside.

## 2018-05-06 NOTE — ED Triage Notes (Signed)
Patient arrived via EMS in respiratory distress. Currently on CPAP. Reports having shortness of breath after laying flat in bed. Reports having to sleep with pillows. Recent diagnosis of AFIB. Alert X 4.

## 2018-05-06 NOTE — ED Notes (Signed)
Attempted report x1. 

## 2018-05-06 NOTE — ED Notes (Signed)
Observed pt o2 sat at 83%. Administered o2 @ 4 lpm Nasal cannula.

## 2018-05-07 DIAGNOSIS — E1121 Type 2 diabetes mellitus with diabetic nephropathy: Secondary | ICD-10-CM | POA: Diagnosis present

## 2018-05-07 DIAGNOSIS — Z7984 Long term (current) use of oral hypoglycemic drugs: Secondary | ICD-10-CM | POA: Diagnosis not present

## 2018-05-07 DIAGNOSIS — N400 Enlarged prostate without lower urinary tract symptoms: Secondary | ICD-10-CM | POA: Diagnosis present

## 2018-05-07 DIAGNOSIS — Z87891 Personal history of nicotine dependence: Secondary | ICD-10-CM | POA: Diagnosis not present

## 2018-05-07 DIAGNOSIS — Z8673 Personal history of transient ischemic attack (TIA), and cerebral infarction without residual deficits: Secondary | ICD-10-CM | POA: Diagnosis not present

## 2018-05-07 DIAGNOSIS — I4510 Unspecified right bundle-branch block: Secondary | ICD-10-CM | POA: Diagnosis present

## 2018-05-07 DIAGNOSIS — T8781 Dehiscence of amputation stump: Secondary | ICD-10-CM | POA: Diagnosis not present

## 2018-05-07 DIAGNOSIS — E1159 Type 2 diabetes mellitus with other circulatory complications: Secondary | ICD-10-CM | POA: Diagnosis not present

## 2018-05-07 DIAGNOSIS — I5033 Acute on chronic diastolic (congestive) heart failure: Secondary | ICD-10-CM

## 2018-05-07 DIAGNOSIS — Y835 Amputation of limb(s) as the cause of abnormal reaction of the patient, or of later complication, without mention of misadventure at the time of the procedure: Secondary | ICD-10-CM | POA: Diagnosis present

## 2018-05-07 DIAGNOSIS — I481 Persistent atrial fibrillation: Secondary | ICD-10-CM | POA: Diagnosis present

## 2018-05-07 DIAGNOSIS — E1151 Type 2 diabetes mellitus with diabetic peripheral angiopathy without gangrene: Secondary | ICD-10-CM | POA: Diagnosis present

## 2018-05-07 DIAGNOSIS — I480 Paroxysmal atrial fibrillation: Secondary | ICD-10-CM

## 2018-05-07 DIAGNOSIS — E1169 Type 2 diabetes mellitus with other specified complication: Secondary | ICD-10-CM | POA: Diagnosis present

## 2018-05-07 DIAGNOSIS — I5023 Acute on chronic systolic (congestive) heart failure: Secondary | ICD-10-CM

## 2018-05-07 DIAGNOSIS — N289 Disorder of kidney and ureter, unspecified: Secondary | ICD-10-CM | POA: Diagnosis not present

## 2018-05-07 DIAGNOSIS — I130 Hypertensive heart and chronic kidney disease with heart failure and stage 1 through stage 4 chronic kidney disease, or unspecified chronic kidney disease: Secondary | ICD-10-CM | POA: Diagnosis present

## 2018-05-07 DIAGNOSIS — I509 Heart failure, unspecified: Secondary | ICD-10-CM

## 2018-05-07 DIAGNOSIS — E1122 Type 2 diabetes mellitus with diabetic chronic kidney disease: Secondary | ICD-10-CM | POA: Diagnosis present

## 2018-05-07 DIAGNOSIS — Z8249 Family history of ischemic heart disease and other diseases of the circulatory system: Secondary | ICD-10-CM | POA: Diagnosis not present

## 2018-05-07 DIAGNOSIS — Z7901 Long term (current) use of anticoagulants: Secondary | ICD-10-CM | POA: Diagnosis not present

## 2018-05-07 DIAGNOSIS — Z79899 Other long term (current) drug therapy: Secondary | ICD-10-CM | POA: Diagnosis not present

## 2018-05-07 DIAGNOSIS — G629 Polyneuropathy, unspecified: Secondary | ICD-10-CM | POA: Diagnosis present

## 2018-05-07 DIAGNOSIS — Z89432 Acquired absence of left foot: Secondary | ICD-10-CM | POA: Diagnosis not present

## 2018-05-07 DIAGNOSIS — R0902 Hypoxemia: Secondary | ICD-10-CM | POA: Diagnosis present

## 2018-05-07 DIAGNOSIS — Z882 Allergy status to sulfonamides status: Secondary | ICD-10-CM | POA: Diagnosis not present

## 2018-05-07 DIAGNOSIS — I10 Essential (primary) hypertension: Secondary | ICD-10-CM | POA: Diagnosis not present

## 2018-05-07 DIAGNOSIS — N183 Chronic kidney disease, stage 3 (moderate): Secondary | ICD-10-CM | POA: Diagnosis present

## 2018-05-07 DIAGNOSIS — R0602 Shortness of breath: Secondary | ICD-10-CM | POA: Diagnosis not present

## 2018-05-07 DIAGNOSIS — D649 Anemia, unspecified: Secondary | ICD-10-CM | POA: Diagnosis present

## 2018-05-07 LAB — CBC
HCT: 29.9 % — ABNORMAL LOW (ref 39.0–52.0)
Hemoglobin: 9.3 g/dL — ABNORMAL LOW (ref 13.0–17.0)
MCH: 27.4 pg (ref 26.0–34.0)
MCHC: 31.1 g/dL (ref 30.0–36.0)
MCV: 88.2 fL (ref 78.0–100.0)
Platelets: 323 10*3/uL (ref 150–400)
RBC: 3.39 MIL/uL — ABNORMAL LOW (ref 4.22–5.81)
RDW: 14.6 % (ref 11.5–15.5)
WBC: 13.3 10*3/uL — ABNORMAL HIGH (ref 4.0–10.5)

## 2018-05-07 LAB — GLUCOSE, CAPILLARY
Glucose-Capillary: 157 mg/dL — ABNORMAL HIGH (ref 70–99)
Glucose-Capillary: 208 mg/dL — ABNORMAL HIGH (ref 70–99)
Glucose-Capillary: 193 mg/dL — ABNORMAL HIGH (ref 70–99)
Glucose-Capillary: 213 mg/dL — ABNORMAL HIGH (ref 70–99)

## 2018-05-07 LAB — HEPARIN LEVEL (UNFRACTIONATED)
Heparin Unfractionated: 0.79 IU/mL — ABNORMAL HIGH (ref 0.30–0.70)
Heparin Unfractionated: 0.38 IU/mL (ref 0.30–0.70)

## 2018-05-07 LAB — APTT: aPTT: 36 seconds (ref 24–36)

## 2018-05-07 MED ORDER — HEPARIN (PORCINE) IN NACL 100-0.45 UNIT/ML-% IJ SOLN
2550.0000 [IU]/h | INTRAMUSCULAR | Status: DC
  Administered 2018-05-07: 1400 [IU]/h via INTRAVENOUS
  Administered 2018-05-08: 1900 [IU]/h via INTRAVENOUS
  Administered 2018-05-09: 2300 [IU]/h via INTRAVENOUS
  Administered 2018-05-09: 2200 [IU]/h via INTRAVENOUS
  Administered 2018-05-10 – 2018-05-11 (×4): 2550 [IU]/h via INTRAVENOUS
  Filled 2018-05-07 (×8): qty 250

## 2018-05-07 MED ORDER — MAGNESIUM SULFATE 2 GM/50ML IV SOLN
2.0000 g | Freq: Once | INTRAVENOUS | Status: AC
  Administered 2018-05-07: 2 g via INTRAVENOUS
  Filled 2018-05-07: qty 50

## 2018-05-07 MED ORDER — MUPIROCIN CALCIUM 2 % EX CREA
TOPICAL_CREAM | Freq: Every day | CUTANEOUS | Status: DC
  Administered 2018-05-07 – 2018-05-09 (×3): via TOPICAL
  Filled 2018-05-07: qty 15

## 2018-05-07 NOTE — Progress Notes (Addendum)
[facility] for Heparin Indication: atrial fibrillation  Allergies  Allergen Reactions  . Sulfa Antibiotics Rash    Patient Measurements: Height: 6\' 1"  (185.4 cm) Weight: 203 lb 7.8 oz (92.3 kg) IBW/kg (Calculated) : 79.9 Heparin Dosing Weight: 94.3 kg  Vital Signs: Temp: 98 F (36.7 C) (08/11 1212) Temp Source: Oral (08/11 1212) BP: 139/61 (08/11 1212) Pulse Rate: 69 (08/11 1212)  Labs: Recent Labs    05/06/18 0328 05/06/18 0618 05/06/18 0912 05/06/18 1134 05/06/18 1535 05/06/18 1550 05/06/18 1750 05/07/18 0516  HGB 9.8* 9.3*  --   --   --   --   --  9.3*  HCT 31.6* 30.7*  --   --   --   --   --  29.9*  PLT 408* 372  --   --   --   --   --  323  APTT  --   --  35  --   --  45*  --   --   HEPARINUNFRC  --   --  1.98*  --  1.38*  --   --  0.79*  CREATININE 1.47* 1.53*  --   --   --   --   --   --   TROPONINI  --  <0.03  --  <0.03  --   --  <0.03  --     Estimated Creatinine Clearance: 47.9 mL/min (A) (by C-G formula based on SCr of 1.53 mg/dL (H)).   Medical History: Past Medical History:  Diagnosis Date  . A-fib ([place]) 03/20/2018  . Acute blood loss anemia   . Benign essential HTN   . Benign prostatic hyperplasia with urinary retention   . Cerebrovascular accident (CVA) ([place])   . CKD (chronic kidney disease), stage III ([place])   . Diabetes ([place]) 03/20/2018  . Diabetes mellitus type 2 in nonobese (HCC)   . Essential hypertension 03/20/2018  . Former tobacco use   . H/O ulcerative colitis 1993  . Hypertension   . Hypokalemia   . Leukocytosis   . New onset atrial fibrillation ([place])   . 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  [name] (paroxysmal atrial fibrillation) ([place])    a. dx 03/2018 in setting of stroke, osteomyelitis.  . Sepsis ([place]) 03/20/2018  . Stage 3 chronic kidney disease ([place])    pt/family unaware  . 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 Stroke ([place]) 03/2018  .  Stroke-like episode ([place]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 03/16/2018  . Tachycardia 03/20/2018  . Wrist fracture    Left   Assessment: 77 YO M presenting with c/o of SOB. On Eliquis PTA for Afib now held d/t bleed from left foot stump which has stopped with pressure. Heparin was started on 8/10 and then held 8/10 1800 d/t bleeding wound. Will restart heparin today per MD.   Heparin level still elevated this AM due to apixaban. H/H low but has remained stable, plts downtrending but remain WNL.    Goal of Therapy:  Heparin level 0.3-0.7 units/ml Monitor platelets by anticoagulation protocol: Yes   Plan:  Restart heparin at 1400 units/hr Will not bolus d/t increase risk of bleeding F/u heparin level and aPTT in 6 hours Monitor daily CBC, heparin level, aPTT, s/sx of bleeding Will monitor plt decine F/u plans for anticoagulation at discharge    Juanell Fairly, PharmD PGY1 Pharmacy Resident Phone [phone] 05/07/2018 12:33 PM  I discussed /  reviewed the pharmacy note by Dr. Wilkie Aye and I agree with the resident's findings and plans as documented. Pt's family apprehensive about resuming heparin due to bleeding. MD discussed with them. Crystal S. Alford Highland, PharmD, [facility] Clinical Staff Pharmacist Pager [phone]

## 2018-05-07 NOTE — Progress Notes (Signed)
[facility] for Heparin Indication: atrial fibrillation  Allergies  Allergen Reactions  . Sulfa Antibiotics Rash    Patient Measurements: Height: 6\' 1"  (185.4 cm) Weight: 203 lb 7.8 oz (92.3 kg) IBW/kg (Calculated) : 79.9 Heparin Dosing Weight: 94.3 kg  Vital Signs: Temp: 98.6 F (37 C) (08/11 1922) Temp Source: Oral (08/11 1922) BP: 116/59 (08/11 1922) Pulse Rate: 75 (08/11 1922)  Labs: Recent Labs    05/06/18 0328 05/06/18 0618  05/06/18 0912 05/06/18 1134 05/06/18 1535 05/06/18 1550 05/06/18 1750 05/07/18 0516 05/07/18 1955  HGB 9.8* 9.3*  --   --   --   --   --   --  9.3*  --   HCT 31.6* 30.7*  --   --   --   --   --   --  29.9*  --   PLT 408* 372  --   --   --   --   --   --  323  --   APTT  --   --   --  35  --   --  45*  --   --  36  HEPARINUNFRC  --   --    < > 1.98*  --  1.38*  --   --  0.79* 0.38  CREATININE 1.47* 1.53*  --   --   --   --   --   --   --   --   TROPONINI  --  <0.03  --   --  <0.03  --   --  <0.03  --   --    < > = values in this interval not displayed.    Estimated Creatinine Clearance: 47.9 mL/min (A) (by C-G formula based on SCr of 1.53 mg/dL (H)).   Medical History: Past Medical History:  Diagnosis Date  . A-fib ([place]) 03/20/2018  . Acute blood loss anemia   . Benign essential HTN   . Benign prostatic hyperplasia with urinary retention   . Cerebrovascular accident (CVA) ([place])   . CKD (chronic kidney disease), stage III ([place])   . Diabetes ([place]) 03/20/2018  . Diabetes mellitus type 2 in nonobese (HCC)   . Essential hypertension 03/20/2018  . Former tobacco use   . H/O ulcerative colitis 1993  . Hypertension   . Hypokalemia   . Leukocytosis   . New onset atrial fibrillation ([place])   . Osteomyelitis ([place])    a. 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  [name] (paroxysmal atrial fibrillation) ([place])    a. dx 03/2018 in setting of stroke, osteomyelitis.  . Sepsis ([place]) 03/20/2018  . Stage 3 chronic  kidney disease ([place])    pt/family unaware  . 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 Stroke ([place]) 03/2018  . Stroke-like episode ([place]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 03/16/2018  . Tachycardia 03/20/2018  . Wrist fracture    Left   Assessment: 77 YO M presenting with c/o of SOB. On Eliquis PTA for Afib now held d/t bleed from left foot stump which has stopped with pressure. Heparin was started on 8/10 and then held 8/10 1800 d/t bleeding wound. Will restart heparin today per MD.   Heparin level still elevated this AM due to apixaban. H/H low but has remained stable, plts downtrending but remain WNL.   PTT came back at 36 which doesn't make much sense at all. Rn said that drip has been running without interruption. Heparin level came back  at 0.38. Will increase the rate.   Goal of Therapy:  Heparin level 0.3-0.7 units/ml Monitor platelets by anticoagulation protocol: Yes   Plan:   Increase heparin to 1650 units/hr Will not bolus d/t increase risk of bleeding Heparin level and PTT in AM  Onnie Boer, PharmD, [place], AAHIVP, CPP Infectious Disease Pharmacist Pager: [phone] 05/07/2018 9:56 PM

## 2018-05-07 NOTE — Progress Notes (Signed)
Wound care nurse contacted for specific dressing change to left foot. WOC has been in earlier this am to see pt. Stated to patient that she was going to read the nurses note from yesterday and that she would return.  WOC did not return to see patient.   Passed on information that Dr. Vista Lawman wanted [facility] to change dressing exclusively. WOC stated that no one would see patient until Monday am.  Currently there is no bleeding noted on dressing to left foot. Heparin to resume.  Will observe closely left foot and reinforce dressing if necessary as well as notifiying MD if need be.

## 2018-05-07 NOTE — Progress Notes (Signed)
Wife of pt in and noted that heparin had been stopped previously due to left foot wound bleeding. Wife stated that Cardiology stressed the fact that patient should not be off anticoagulants and if so, cardiology needed to know.  Paged PA, Dayna who suggested to notify primary.  Call out to primary.

## 2018-05-07 NOTE — Progress Notes (Signed)
[facility]                                                                              Patient Demographics  Kevin [name], is a 77 y.o. male, DOB - [birth year], NAT:[id number]  Admit date - 05/06/2018   Admitting Physician Rise Patience, MD  Outpatient Primary MD for the patient is [name], MD  Outpatient specialists:   LOS - 0  days    Chief Complaint  Patient presents with  . Shortness of Breath       Brief summary  77 year old gentleman with medical history significant for but not limited to atrial fibrillation, previous CVA, diabetes mellitus, CKD 3, and left foot osteomyelitis status post left-sided transmetatarsal amputation presenting with 1 day history of shortness of breath that woke him up from sleep early this morning.  ED evaluation noted elevated BNP of 379 with CHF and x-ray and hypoxia and required IV diuretics with BiPAP treatment in the ED with improvement and admitted for  Acute on chronic CHF.   Agree with diuretics monitor electrolytes and renal function and holding apixaban for now due to high risk of rebleeding.    Assessment & Plan    Active Problems:   Essential hypertension   Paroxysmal atrial fibrillation (HCC)   Status post transmetatarsal amputation of left foot (HCC)   Acute on chronic congestive heart failure (HCC)   Renal insufficiency   Type 2 diabetes mellitus with vascular disease ([place])   Acute on chronic diastolic CHF:  EF in June 2019 was 60 to 65%.   Patient improved after Lasix was given and has been weaned off BiPAP, but still  Hypoxic Continue diuresis Tele monitoring cardiac enzymes Dly wts, I/O   Atrial fibrillation status post cardioversion: Cont metoprolol, Cardizem Left Foot wound bleeding for which reason Apixaban was stopped and switched to hep gtt with pressure wound dressing.   Chronic renal disease stage III: Follow metabolic panel.   Diabetes mellitus type 2:  sliding scale  coverage and Januvia.  Hold glimepiride and metformin.  Recent stroke:  on apixaban.  Had received TPA at that time.  Left foot transmetatarsal amputation last month for osteomyelitis:   Had visited [name] Given last week prior to cardioversion and had dressing change and patient states that clot was removed. Chronic anemia follow CBC.  Code Status: Full code DVT Prophylaxis:  heparin gtt Family Communication: Discussed in detail with the patient, all imaging results, lab results explained to the patient and wife at bedside   Disposition Plan: Home  Time Spent in minutes  32 minutes  Procedures:    Consultants:   Cardiology, Dr Oval Linsey  Antimicrobials:      Medications  Scheduled Meds: . diltiazem  240 mg Oral Daily  . FLUoxetine  20 mg Oral Daily  . fluticasone  2 spray Each Nare QHS  . furosemide  40 mg Intravenous BID  . insulin aspart  0-9 Units Subcutaneous TID WC  . linagliptin  5 mg Oral Daily  . mesalamine  0.75 g Oral BID  . metoprolol succinate  200 mg Oral Daily  . multivitamin with minerals  1 tablet Oral Daily  . mupirocin cream   Topical Daily  . rosuvastatin  5 mg Oral Daily  . tamsulosin  0.4 mg Oral BID   Continuous Infusions: PRN Meds:.acetaminophen **OR** acetaminophen, ondansetron **OR** ondansetron (ZOFRAN) IV   Antibiotics   Anti-infectives (From admission, onward)   None        Subjective:   Kevin [name] was seen and examined today.  Patient denies dizziness, chest pain, shortness of breath, abdominal pain, N/V/D/C, new weakness, numbess, tingling. No more bleeding and heparin restarted  Objective:   Vitals:   05/07/18 0007 05/07/18 0404 05/07/18 0541 05/07/18 0630  BP: 129/68 (!) 107/38  (!) 119/57  Pulse: 70 72 67 70  Resp: 18 18    Temp: 98 F (36.7 C) 98.1 F (36.7 C)    TempSrc: Oral Oral    SpO2: 98% 93% 98%   Weight:  92.3 kg    Height:        Intake/Output Summary (Last 24 hours) at 05/07/2018 1038 Last data  filed at 05/07/2018 3267 Gross per 24 hour  Intake 1278 ml  Output 2225 ml  Net -947 ml     Wt Readings from Last 3 Encounters:  05/07/18 92.3 kg  05/05/18 96.2 kg  05/04/18 96.2 kg     Exam  General: NAD  HEENT: NCAT,  PERRL,MMM  Neck: SUPPLE, (-) JVD  Cardiovascular: RRR, (-) GALLOP, (-) MURMUR  Respiratory: CTA  Gastrointestinal: SOFT, (-) DISTENSION, BS(+), (_) TENDERNESS  Ext: (-) CYANOSIS, (-) EDEMA, left foot dressing  Neuro: A, OX 3  Skin:(-) RASH  Psych:NORMAL AFFECT/MOOD   Data Reviewed:  I have personally reviewed following labs and imaging studies  Micro Results No results found for this or any previous visit (from the past 240 hour(s)).  Radiology Reports Dg Chest 2 View  Result Date: 04/27/2018 CLINICAL DATA:  Shortness of breath EXAM: CHEST - 2 VIEW COMPARISON:  03/17/2018, 10/20/2014 FINDINGS: Hyperinflation. Small pleural effusions. Increased interstitial and ground-glass opacity in the right greater than left lungs. Heart size within normal limits. Aortic atherosclerosis. No pneumothorax. IMPRESSION: 1. Small pleural effusions. Increased interstitial and ground-glass opacity in the right greater than left lung, may reflect edema or interstitial inflammatory process. Electronically Signed   By: Donavan Foil M.D.   On: 04/27/2018 16:29   Dg Chest Port 1 View  Result Date: 05/06/2018 CLINICAL DATA:  Acute onset of respiratory distress. Shortness of breath. EXAM: PORTABLE CHEST 1 VIEW COMPARISON:  Chest radiograph performed 04/27/2018 FINDINGS: The lungs are well-aerated. Bilateral midlung and bibasilar airspace opacities raise concern for pulmonary edema, with a small left pleural effusion. No pneumothorax is seen. Mild vascular congestion is noted. The cardiomediastinal silhouette is within normal limits. No acute osseous abnormalities are seen. IMPRESSION: Bilateral midlung and bibasilar airspace opacities raise concern for pulmonary edema, with a  small left pleural effusion, mildly worsened from the prior study. Mild vascular congestion noted. Electronically Signed   By: Garald Balding M.D.   On: 05/06/2018 03:46    Lab Data:  CBC: Recent Labs  Lab 05/06/18 0328 05/06/18 0618 05/07/18 0516  WBC 20.2* 15.4* 13.3*  NEUTROABS 16.7* 12.6*  --   HGB 9.8* 9.3* 9.3*  HCT 31.6* 30.7* 29.9*  MCV 88.8 88.5 88.2  PLT 408* 372 124   Basic Metabolic Panel: Recent Labs  Lab 05/03/18 1301 05/06/18 0328 05/06/18 0618  NA 140 134* 137  K 4.3 3.9 4.0  CL 98 100 101  CO2 26 23  26  GLUCOSE 161* 234* 198*  BUN 18 20 19   CREATININE 1.37* 1.47* 1.53*  CALCIUM 9.5 8.9 8.9  MG  --   --  1.5*   GFR: Estimated Creatinine Clearance: 47.9 mL/min (A) (by C-G formula based on SCr of 1.53 mg/dL (H)). Liver Function Tests: Recent Labs  Lab 05/06/18 0328 05/06/18 0618  AST 22 18  ALT 22 23  ALKPHOS 111 95  BILITOT 1.0 0.8  PROT 7.0 7.2  ALBUMIN 3.2* 3.2*   No results for input(s): LIPASE, AMYLASE in the last 168 hours. No results for input(s): AMMONIA in the last 168 hours. Coagulation Profile: No results for input(s): INR, PROTIME in the last 168 hours. Cardiac Enzymes: Recent Labs  Lab 05/06/18 0618 05/06/18 1134 05/06/18 1750  TROPONINI <0.03 <0.03 <0.03   BNP (last 3 results) Recent Labs    04/27/18 1236  PROBNP 3,814*   HbA1C: No results for input(s): HGBA1C in the last 72 hours. CBG: Recent Labs  Lab 05/06/18 0742 05/06/18 1202 05/06/18 1550 05/06/18 2050 05/07/18 0751  GLUCAP 170* 77 173* 193* 157*   Lipid Profile: No results for input(s): CHOL, HDL, LDLCALC, TRIG, CHOLHDL, LDLDIRECT in the last 72 hours. Thyroid Function Tests: Recent Labs    05/06/18 0618  TSH 3.874   Anemia Panel: No results for input(s): VITAMINB12, FOLATE, FERRITIN, TIBC, IRON, RETICCTPCT in the last 72 hours. Urine analysis:    Component Value Date/Time   COLORURINE YELLOW 03/16/2018 2125   APPEARANCEUR HAZY (A)  03/16/2018 2125   LABSPEC 1.011 03/16/2018 2125   PHURINE 6.0 03/16/2018 2125   GLUCOSEU >=500 (A) 03/16/2018 2125   HGBUR LARGE (A) 03/16/2018 2125   BILIRUBINUR NEGATIVE 03/16/2018 2125   KETONESUR 5 (A) 03/16/2018 2125   PROTEINUR 30 (A) 03/16/2018 2125   NITRITE NEGATIVE 03/16/2018 2125   LEUKOCYTESUR LARGE (A) 03/16/2018 2125     Benito Mccreedy M.D. [facility] 05/07/2018, 10:38 AM  Pager: [phone] Between 7am to 7pm - call Pager - [phone]  After 7pm go to [email or web address] - password [email or web address]  Call night coverage person covering after 7pm

## 2018-05-07 NOTE — Consult Note (Signed)
Primary Physician: [name], MD PCP-Cardiologist:  Lauree Chandler, MD  Reason for Consultation: CHF  HPI:    Kevin [name] is seen today for evaluation of CHF at the request of Dr. Vista Lawman.   Kevin [name] is a 77 y.o. 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 He was admitted on 8/10 with acute on chronic diastolic CHF. In June 2019, he was admitted with left-sided weakness, left facial droop and expressive deficits. CT of head was negative and he received TPA. Hospitalization notable for oseteomyelitis of foot s/p I&D of left foot ulcer and ultimately left transmetatarsal amputation in 7/19. Cardiology followed for initially PACs/PVCS then new onset of atrial fibrillation. He was started on Eliquis. 2D echo 03/17/18 showed EF 60-65%, mod LAE.  Patient has been short of breath with exertion ever since he was discharged from the hospital in 7/19. He was seen as an outpatient and given persistent atrial fibrillation with CHF symptoms, was set up for DCCV.  This was done on 05/05/18.  Due to worsening dyspnea, he came to the ER on 8/10 and was admitted for acute on chronic diastolic CHF. CXR showed pulmonary edema and he remained in NSR on ECG and telemetry. He has diuresed well so far with IV Lasix. He had bleeding at his left foot surgical site and Eliquis was stopped, now on heparin gtt.  Hemoglobin has been stable so far.  He is still short of breath with any exertion.  Of note, [name] Given is planning to take him back to the OR on Wednesday for debridement.   ECG: NSR, normal (personally reviewed)  CXR: Pulmonary edema.   Review of Systems: All systems reviewed and negative except as per HPI.   Home Medications Prior to Admission medications   Medication Sig Start Date End Date Taking? Authorizing Provider  apixaban (ELIQUIS) 5 MG TABS tablet Take  1 tablet (5 mg total) by mouth 2 (two) times daily. 04/27/18  Yes Dunn, Dayna N, PA-C  diltiazem (CARDIZEM CD) 240 MG 24 hr capsule Take 1 capsule (240 mg total) by mouth daily. 04/27/18  Yes Dunn, Dayna N, PA-C  FLUoxetine (PROZAC) 20 MG capsule Take 20 mg by mouth daily.   Yes [provider]  fluticasone (FLONASE) 50 MCG/ACT nasal spray Place 2 sprays into both nostrils at bedtime.   Yes [provider]  furosemide (LASIX) 20 MG tablet Take 1 tablet (20 mg total) by mouth daily. 04/28/18 07/27/18 Yes Dunn, Dayna N, PA-C  glimepiride (AMARYL) 4 MG tablet Take 4 mg by mouth daily with breakfast.   Yes [provider]  mesalamine (APRISO) 0.375 g 24 hr capsule Take 750 mg by mouth 2 (two) times daily.    Yes [provider]  metFORMIN (GLUCOPHAGE) 500 MG tablet Take 1,000 mg by mouth 2 (two) times daily with a meal.    Yes [provider]  metoprolol succinate (TOPROL-XL) 100 MG 24 hr tablet Take 2 tablets (200 mg total) by mouth daily. Take with or immediately following a meal. 04/27/18 07/26/18 Yes Dunn, Dayna N, PA-C  Multiple Vitamin (MULTIVITAMIN WITH MINERALS) TABS tablet Take 1 tablet by mouth daily.   Yes [provider]  mupirocin ointment (BACTROBAN) 2 % Apply 1 application topically daily.   Yes [provider]  rosuvastatin (CRESTOR) 10 MG tablet Take 0.5 tablets (5 mg total) by mouth daily. 03/31/18  Yes Love, Ivan Anchors, PA-C  sitaGLIPtin (JANUVIA) 100 MG tablet Take 100 mg by mouth daily.   Yes [provider]  tamsulosin (FLOMAX) 0.4 MG CAPS capsule Take 1 capsule (0.4 mg total) by mouth 2 times daily at 12 noon and 4 pm. 03/31/18  Yes Love, Ivan Anchors, PA-C    Past Medical History: 1. Atrial fibrillation: Paroxysmal.  Had DCCV 05/05/18.  2. 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  Had TPA.  Likely afib-related.  3. Chronic diastolic CHF:  - Echo (7/82) with EF 60-65%.  4. HTN 5. CKD stage 3 6. DM  7. H/o ulcerative colitis 8.  Osteomyelitis: s/p transmetatarsal amputation left foot in 7/19.   Past Surgical History: Past Surgical History:  Procedure Laterality Date  . AMPUTATION Left 03/24/2018   Procedure: Transmetatarsal Amputation Left Foot;  Surgeon: [name], MD;  Location: [facility];  Service: Orthopedics;  Laterality: Left;  . CARDIOVERSION N/A 05/05/2018   Procedure: CARDIOVERSION;  Surgeon: [name], MD;  Location: [facility] ENDOSCOPY;  Service: Cardiovascular;  Laterality: N/A;  . COLONOSCOPY  12/2017   benign, remission from ulcerative colitis in 90s  . I&D EXTREMITY Left 03/17/2018   Procedure: IRRIGATION AND DEBRIDEMENT FOOT;  Surgeon: [name], MD;  Location: [facility];  Service: Orthopedics;  Laterality: Left;    Family History: Family History  Problem Relation Age of Onset  . Hypertension Father     Social History: Social History   Socioeconomic History  . Marital status: Married    Spouse name: Not on file  . Number of children: Not on file  . Years of education: Not on file  . Highest education level: Bachelor's degree (e.g., BA, AB, BS)  Occupational History  . Not on file  Social Needs  . Financial resource strain: Not on file  . Food insecurity:    Worry: Not on file    Inability: Not on file  . Transportation needs:    Medical: Not on file    Non-medical: Not on file  Tobacco Use  . Smoking status: Former Smoker    Packs/day: 1.00    Types: Cigarettes    Start date: 1963    Last attempt to quit: 1985    Years since quitting: 34.6  . Smokeless tobacco: Never Used  Substance and Sexual Activity  . Alcohol use: Yes    Alcohol/week: 4.0 standard drinks    Types: 4 Cans of beer per week    Comment: social   . Drug use: Never  . Sexual activity: Not on file  Lifestyle  . Physical activity:    Days per week: Not on file    Minutes per session: Not on file  . Stress: Not on file  Relationships  . Social connections:    Talks on phone: Not on file    Gets  together: Not on file    Attends religious service: Not on file    Active member of club or organization: Not on file    Attends meetings of clubs or organizations: Not on file    Relationship status: Not on file  Other Topics Concern  . Not on file  Social History Narrative   Lives at home with his wife and two cats. Married for almost 45 years   Right handed   Caffeine: 1 cup every morning    Allergies:  Allergies  Allergen Reactions  . Sulfa Antibiotics Rash    Objective:    Vital Signs:   Temp:  [  98 F (36.7 C)-98.4 F (36.9 C)] 98 F (36.7 C) (08/11 1212) Pulse Rate:  [56-72] 69 (08/11 1212) Resp:  [18] 18 (08/11 1212) BP: (106-139)/(38-68) 139/61 (08/11 1212) SpO2:  [93 %-98 %] 96 % (08/11 1212) Weight:  [92.3 kg] 92.3 kg (08/11 0404) Last BM Date: 05/06/18  Weight change: Filed Weights   05/06/18 0316 05/06/18 0600 05/07/18 0404  Weight: 94.3 kg 95.6 kg 92.3 kg    Intake/Output:   Intake/Output Summary (Last 24 hours) at 05/07/2018 1533 Last data filed at 05/07/2018 1213 Gross per 24 hour  Intake 1128 ml  Output 2350 ml  Net -1222 ml      Physical Exam    General:  Well appearing. No resp difficulty HEENT: normal Neck: supple. JVP 14+ cm. Carotids 2+ bilat; no bruits. No lymphadenopathy or thyromegaly appreciated. Cor: PMI nondisplaced. Regular rate & rhythm. No rubs, gallops or murmurs. Lungs: Crackles at bases bilaterally.  Abdomen: soft, nontender, nondistended. No hepatosplenomegaly. No bruits or masses. Good bowel sounds. Extremities: no cyanosis, clubbing, rash, edema Neuro: alert & orientedx3, cranial nerves grossly intact. moves all 4 extremities w/o difficulty. Affect pleasant   Telemetry   NSR (personally reviewed)  EKG    NSR, normal (personally reviewed)  Labs   Basic Metabolic Panel: Recent Labs  Lab 05/03/18 1301 05/06/18 0328 05/06/18 0618  NA 140 134* 137  K 4.3 3.9 4.0  CL 98 100 101  CO2 26 23 26   GLUCOSE 161*  234* 198*  BUN 18 20 19   CREATININE 1.37* 1.47* 1.53*  CALCIUM 9.5 8.9 8.9  MG  --   --  1.5*    Liver Function Tests: Recent Labs  Lab 05/06/18 0328 05/06/18 0618  AST 22 18  ALT 22 23  ALKPHOS 111 95  BILITOT 1.0 0.8  PROT 7.0 7.2  ALBUMIN 3.2* 3.2*   No results for input(s): LIPASE, AMYLASE in the last 168 hours. No results for input(s): AMMONIA in the last 168 hours.  CBC: Recent Labs  Lab 05/06/18 0328 05/06/18 0618 05/07/18 0516  WBC 20.2* 15.4* 13.3*  NEUTROABS 16.7* 12.6*  --   HGB 9.8* 9.3* 9.3*  HCT 31.6* 30.7* 29.9*  MCV 88.8 88.5 88.2  PLT 408* 372 323    Cardiac Enzymes: Recent Labs  Lab 05/06/18 0618 05/06/18 1134 05/06/18 1750  TROPONINI <0.03 <0.03 <0.03    BNP: BNP (last 3 results) Recent Labs    03/17/18 1148 05/06/18 0328  BNP 358.5* 379.3*    ProBNP (last 3 results) Recent Labs    04/27/18 1236  PROBNP 3,814*     CBG: Recent Labs  Lab 05/06/18 1202 05/06/18 1550 05/06/18 2050 05/07/18 0751 05/07/18 1210  GLUCAP 77 173* 193* 157* 208*    Coagulation Studies: No results for input(s): LABPROT, INR in the last 72 hours.   Imaging    No results found.   Medications:     Current Medications: . diltiazem  240 mg Oral Daily  . FLUoxetine  20 mg Oral Daily  . fluticasone  2 spray Each Nare QHS  . furosemide  40 mg Intravenous BID  . insulin aspart  0-9 Units Subcutaneous TID WC  . linagliptin  5 mg Oral Daily  . mesalamine  0.75 g Oral BID  . metoprolol succinate  200 mg Oral Daily  . multivitamin with minerals  1 tablet Oral Daily  . mupirocin cream   Topical Daily  . rosuvastatin  5 mg Oral Daily  . tamsulosin  0.4 mg Oral BID     Infusions: . heparin 1,400 Units/hr (05/07/18 1419)  . magnesium sulfate 1 - 4 g bolus IVPB         Patient Profile   Kevin [name] is a 77 y.o. male with history of HTN, DM, former tobacco abuse, recently diagnosed stroke, osteomyelitis, persistent afib, RBBB  while hospitalized and probable CKD III. He was admitted on 8/10 with acute on chronic diastolic CHF.   Assessment/Plan   1. Acute on chronic diastolic CHF: Echo in 4/85 with EF 60-65%.  Admitted with dyspnea since last hospital discharge and volume overload/pulmonary edema on CXR.  Troponin has been negative.  On exam today, he remains volume overloaded.  He diuresed reasonably yesterday with Lasix 40 mg IV bid but is still very short of breath.  - Increase Lasix to 60 mg IV bid.  - Replace K and Mg.  - Follow creatinine closely.  2. Atrial fibrillation: Paroxysmal.  He remains in NSR today s/p DCCV on 05/05/18.  He had recent CVA.  Eliquis was stopped at admission due to surgical site bleeding and he was started on heparin gtt which continues currently. He is also supposed to go to the OR on Wednesday with [name] Given for debridement of his amputation site.  - Not ideal situation, he will need to continue heparin gtt (bleeding seems to have resolved for the time being) until just prior to foot surgery.  He should start back on anticoagulation after the surgery. With recent DCCV and recent CVA, needs to limit time off anticoagulation.  - Continue Toprol XL.  3. S/p transmetatarsal amputation in 7/19 for osteomyelitis: Not on antibiotics.  WBCs trending down.  He has had bleeding at the surgical site, seems to have resolved for now. Hemoglobin stable.  - Continue heparin gtt as above.  - Will need to go to OR for debridement on Wednesday.  4. CVA: Had CVA in 6/19 related to atrial fibrillation.  Minimal residual deficits.    Length of Stay: 0  Loralie Champagne, MD  05/07/2018, 3:33 PM  Advanced Heart Failure Team Pager [phone] (M-F; 7a - 4p)  Please contact [facility] for night-coverage after hours (4p -7a ) and weekends on [email or web address]

## 2018-05-07 NOTE — Progress Notes (Signed)
Dressing to left foot continues to be clean, dry and intact. No noted breakthru bleeding  While heparin is continuing. Will continue to observe.

## 2018-05-07 NOTE — Consult Note (Signed)
[facility] Nurse wound consult note Reason for Consult:Left transmetatarsal amputation by Dr Sharol Given.  Recent dehiscence/  Patient reports he has been applying mupirocin ointment to nonintact edges daily.  Pressure dressing on left foot surgical site at this time. Currently on Heparin.  Will keep ongoing treatment Wound type:dehisced surgical Pressure Injury POA: NA Measurement:unable to assess Wound NGB:MBOMQT to assess Drainage (amount, consistency, odor) bleeding  Periwound: TMA site Dressing procedure/placement/frequency:Cleanse left foot TMA site with NS. Apply mupirocin to dehisced site.  Cover with gauze and wrap with kerlix/tape.  Change daily  Will not follow at this time.  Please re-consult if needed.  Domenic Moras RN BSN [facility] Pager [phone]

## 2018-05-08 DIAGNOSIS — E1159 Type 2 diabetes mellitus with other circulatory complications: Secondary | ICD-10-CM

## 2018-05-08 DIAGNOSIS — Z89432 Acquired absence of left foot: Secondary | ICD-10-CM

## 2018-05-08 LAB — BASIC METABOLIC PANEL
Anion gap: 10 (ref 5–15)
BUN: 17 mg/dL (ref 8–23)
CO2: 29 mmol/L (ref 22–32)
Calcium: 8.5 mg/dL — ABNORMAL LOW (ref 8.9–10.3)
Chloride: 94 mmol/L — ABNORMAL LOW (ref 98–111)
Creatinine, Ser: 1.29 mg/dL — ABNORMAL HIGH (ref 0.61–1.24)
GFR calc Af Amer: >60 mL/min (ref >60)
GFR calc non Af Amer: 53 mL/min — ABNORMAL LOW (ref >60)
Glucose, Bld: 176 mg/dL — ABNORMAL HIGH (ref 70–99)
Potassium: 3.1 mmol/L — ABNORMAL LOW (ref 3.5–5.1)
Sodium: 133 mmol/L — ABNORMAL LOW (ref 135–145)

## 2018-05-08 LAB — GLUCOSE, CAPILLARY
Glucose-Capillary: 183 mg/dL — ABNORMAL HIGH (ref 70–99)
Glucose-Capillary: 226 mg/dL — ABNORMAL HIGH (ref 70–99)
Glucose-Capillary: 200 mg/dL — ABNORMAL HIGH (ref 70–99)
Glucose-Capillary: 176 mg/dL — ABNORMAL HIGH (ref 70–99)

## 2018-05-08 LAB — APTT
aPTT: 39 seconds — ABNORMAL HIGH (ref 24–36)
aPTT: 45 seconds — ABNORMAL HIGH (ref 24–36)
aPTT: 54 seconds — ABNORMAL HIGH (ref 24–36)

## 2018-05-08 LAB — CBC
HCT: 27.4 % — ABNORMAL LOW (ref 39.0–52.0)
Hemoglobin: 8.8 g/dL — ABNORMAL LOW (ref 13.0–17.0)
MCH: 27.6 pg (ref 26.0–34.0)
MCHC: 32.1 g/dL (ref 30.0–36.0)
MCV: 85.9 fL (ref 78.0–100.0)
Platelets: 302 10*3/uL (ref 150–400)
RBC: 3.19 MIL/uL — ABNORMAL LOW (ref 4.22–5.81)
RDW: 14.3 % (ref 11.5–15.5)
WBC: 13.2 10*3/uL — ABNORMAL HIGH (ref 4.0–10.5)

## 2018-05-08 LAB — HEPARIN LEVEL (UNFRACTIONATED)
Heparin Unfractionated: 0.38 IU/mL (ref 0.30–0.70)
Heparin Unfractionated: 0.35 IU/mL (ref 0.30–0.70)

## 2018-05-08 LAB — MAGNESIUM: Magnesium: 1.7 mg/dL (ref 1.7–2.4)

## 2018-05-08 MED ORDER — POTASSIUM CHLORIDE CRYS ER 20 MEQ PO TBCR
40.0000 meq | EXTENDED_RELEASE_TABLET | ORAL | Status: AC
  Administered 2018-05-08 (×2): 40 meq via ORAL
  Filled 2018-05-08 (×2): qty 2

## 2018-05-08 NOTE — Progress Notes (Signed)
No signs of bleeding  Increased heparin drip per pharmacy  Pt dressing still clean dry and intact

## 2018-05-08 NOTE — Progress Notes (Addendum)
Patient ID: Kevin [name], male   DOB: 1941-08-05, 77 y.o.   MRN: [id number]  PROGRESS NOTE    Kevin [name]  VOJ:[id number] DOB: 01-31-1941 DOA: 05/06/2018  PCP: [name], MD   Brief Narrative:  77 year old male with medical history significant for but not limited to atrial fibrillation, previous CVA, diabetes mellitus, CKD 3, and left foot osteomyelitis status post left-sided transmetatarsal amputation presenting with 1 day history of shortness of breath that woke him up from sleep. ED evaluation noted elevated BNP of 379 with CHF and x-ray and hypoxia and required IV diuretics with BiPAP treatment in the ED with improvement and admitted for acute on chronic CHF.   Assessment & Plan:   Acute on chronic diastolic CHF: - EF in June 2019 was 60 to 65%.  - Continue Lasix 40 mg IV 2 times daily  - Continue daily weight and strict intake and output  - Replete electrolytes   Paroxysmal atrial fibrillation - CHADS vasc score 4 - On AC with heparin drip (holding apixaban) - Monitor for bleeding - Cardizem and metoprolol  for HR control   Chronic renal disease stage III - Monitor renal fxn since pt on IV lasix   Diabetes mellitus type 2 with diabetic nephropathy - Continue SSI and Januvia  Left foot transmetatarsal amputation last month for osteomyelitis:  - Had visited [name] Given last week prior to cardioversion and had dressing change and patient states that clot was removed.   DVT prophylaxis: Heparin drip Code Status: full code  Family Communication: no family at bedside this am Disposition Plan: pt acute, not stable for surgery    Consultants:   Cardiology   Procedures:   None  Antimicrobials:   None    Subjective: No overnight events.   Objective: Vitals:   05/08/18 0017 05/08/18 0434 05/08/18 0840 05/08/18 1132  BP: 128/63 118/60 (!) 118/54 118/66  Pulse: 69 69 70 61  Resp: 20 20  18   Temp: 99.2 F (37.3 C) 97.8 F (36.6 C)  98.4  F (36.9 C)  TempSrc: Oral Oral  Oral  SpO2: 94% 93%  95%  Weight:  91.8 kg    Height:        Intake/Output Summary (Last 24 hours) at 05/08/2018 1353 Last data filed at 05/08/2018 9937 Gross per 24 hour  Intake 1944 ml  Output 3110 ml  Net -1166 ml   Filed Weights   05/06/18 0600 05/07/18 0404 05/08/18 0434  Weight: 95.6 kg 92.3 kg 91.8 kg    Examination:  General exam: Appears calm and comfortable  Respiratory system: basilar crackles, no rhonchi  Cardiovascular system: S1 & S2 heard, Rate controlled  Gastrointestinal system: Abdomen is nondistended, soft and nontender. No organomegaly or masses felt. Normal bowel sounds heard. Central nervous system: Alert and oriented. No focal neurological deficits. Extremities: Symmetric 5 x 5 power. Skin: Left  foot dressing in place Psychiatry: Judgement and insight appear normal. Mood & affect appropriate.   Data Reviewed: I have personally reviewed following labs and imaging studies  CBC: Recent Labs  Lab 05/06/18 0328 05/06/18 0618 05/07/18 0516 05/08/18 0355  WBC 20.2* 15.4* 13.3* 13.2*  NEUTROABS 16.7* 12.6*  --   --   HGB 9.8* 9.3* 9.3* 8.8*  HCT 31.6* 30.7* 29.9* 27.4*  MCV 88.8 88.5 88.2 85.9  PLT 408* 372 323 169   Basic Metabolic Panel: Recent Labs  Lab 05/03/18 1301 05/06/18 0328 05/06/18 0618 05/08/18 0355  NA 140 134* 137 133*  K 4.3 3.9 4.0 3.1*  CL 98 100 101 94*  CO2 26 23 26 29   GLUCOSE 161* 234* 198* 176*  BUN 18 20 19 17   CREATININE 1.37* 1.47* 1.53* 1.29*  CALCIUM 9.5 8.9 8.9 8.5*  MG  --   --  1.5* 1.7   GFR: Estimated Creatinine Clearance: 56.8 mL/min (A) (by C-G formula based on SCr of 1.29 mg/dL (H)). Liver Function Tests: Recent Labs  Lab 05/06/18 0328 05/06/18 0618  AST 22 18  ALT 22 23  ALKPHOS 111 95  BILITOT 1.0 0.8  PROT 7.0 7.2  ALBUMIN 3.2* 3.2*   No results for input(s): LIPASE, AMYLASE in the last 168 hours. No results for input(s): AMMONIA in the last 168  hours. Coagulation Profile: No results for input(s): INR, PROTIME in the last 168 hours. Cardiac Enzymes: Recent Labs  Lab 05/06/18 0618 05/06/18 1134 05/06/18 1750  TROPONINI <0.03 <0.03 <0.03   BNP (last 3 results) Recent Labs    04/27/18 1236  PROBNP 3,814*   HbA1C: No results for input(s): HGBA1C in the last 72 hours. CBG: Recent Labs  Lab 05/07/18 1210 05/07/18 1639 05/07/18 2115 05/08/18 0744 05/08/18 1130  GLUCAP 208* 193* 213* 183* 226*   Lipid Profile: No results for input(s): CHOL, HDL, LDLCALC, TRIG, CHOLHDL, LDLDIRECT in the last 72 hours. Thyroid Function Tests: Recent Labs    05/06/18 0618  TSH 3.874   Anemia Panel: No results for input(s): VITAMINB12, FOLATE, FERRITIN, TIBC, IRON, RETICCTPCT in the last 72 hours. Urine analysis:  Sepsis Labs: @LABRCNTIP (procalcitonin:4,lacticidven:4)   )No results found for this or any previous visit (from the past 240 hour(s)).    Radiology Studies: Dg Chest Port 1 View Result Date: 05/06/2018 Bilateral midlung and bibasilar airspace opacities raise concern for pulmonary edema, with a small left pleural effusion, mildly worsened from the prior study. Mild vascular congestion noted.     Scheduled Meds: . diltiazem  240 mg Oral Daily  . FLUoxetine  20 mg Oral Daily  . fluticasone  2 spray Each Nare QHS  . furosemide  40 mg Intravenous BID  . insulin aspart  0-9 Units Subcutaneous TID WC  . linagliptin  5 mg Oral Daily  . mesalamine  0.75 g Oral BID  . metoprolol succinate  200 mg Oral Daily  . multivitamin  1 tablet Oral Daily  . mupirocin cream   Topical Daily  . rosuvastatin  5 mg Oral Daily  . tamsulosin  0.4 mg Oral BID   Continuous Infusions: . heparin 1,900 Units/hr (05/08/18 0933)     LOS: 1 day    Time spent: 25 minutes Greater than 50% of the time spent on counseling and coordinating the care.   Leisa Lenz, MD [facility] Pager [phone]  If 7PM-7AM, please contact  night-coverage [email or web address] Password [email or web address] 05/08/2018, 1:53 PM

## 2018-05-08 NOTE — Progress Notes (Addendum)
Progress Note  Patient Name: Kevin [name] Date of Encounter: 05/08/2018  Primary Cardiologist: Lauree Chandler, MD   Subjective   Denies any CP or SOB.   Inpatient Medications    Scheduled Meds: . diltiazem  240 mg Oral Daily  . FLUoxetine  20 mg Oral Daily  . fluticasone  2 spray Each Nare QHS  . furosemide  40 mg Intravenous BID  . insulin aspart  0-9 Units Subcutaneous TID WC  . linagliptin  5 mg Oral Daily  . mesalamine  0.75 g Oral BID  . metoprolol succinate  200 mg Oral Daily  . multivitamin with minerals  1 tablet Oral Daily  . mupirocin cream   Topical Daily  . potassium chloride  40 mEq Oral Q4H  . rosuvastatin  5 mg Oral Daily  . tamsulosin  0.4 mg Oral BID   Continuous Infusions: . heparin 1,900 Units/hr (05/08/18 9937)   PRN Meds: acetaminophen **OR** acetaminophen, ondansetron **OR** ondansetron (ZOFRAN) IV   Vital Signs    Vitals:   05/07/18 1648 05/07/18 1922 05/08/18 0017 05/08/18 0434  BP: 118/65 (!) 116/59 128/63 118/60  Pulse: 65 75 69 69  Resp: 18 18 20 20   Temp: 98.5 F (36.9 C) 98.6 F (37 C) 99.2 F (37.3 C) 97.8 F (36.6 C)  TempSrc: Oral Oral Oral Oral  SpO2: 94% 90% 94% 93%  Weight:    91.8 kg  Height:        Intake/Output Summary (Last 24 hours) at 05/08/2018 0755 Last data filed at 05/08/2018 1696 Gross per 24 hour  Intake 2044 ml  Output 3610 ml  Net -1566 ml   Filed Weights   05/06/18 0600 05/07/18 0404 05/08/18 0434  Weight: 95.6 kg 92.3 kg 91.8 kg    Telemetry    NSR with HR high 60s, no significant ventricular ectopy - Personally Reviewed  ECG    NSR without significant ST-T wave changes - Personally Reviewed  Physical Exam   GEN: No acute distress.   Neck: No JVD Cardiac: RRR, no murmurs, rubs, or gallops.  Respiratory: Clear to auscultation bilaterally. GI: Soft, nontender, non-distended  MS: No edema; No deformity. Neuro:  Nonfocal  Psych: Normal affect   Labs    Chemistry Recent  Labs  Lab 05/06/18 0328 05/06/18 0618 05/08/18 0355  NA 134* 137 133*  K 3.9 4.0 3.1*  CL 100 101 94*  CO2 23 26 29   GLUCOSE 234* 198* 176*  BUN 20 19 17   CREATININE 1.47* 1.53* 1.29*  CALCIUM 8.9 8.9 8.5*  PROT 7.0 7.2  --   ALBUMIN 3.2* 3.2*  --   AST 22 18  --   ALT 22 23  --   ALKPHOS 111 95  --   BILITOT 1.0 0.8  --   GFRNONAA 45* 43* 53*  GFRAA 52* 50* >60  ANIONGAP 11 10 10      Hematology Recent Labs  Lab 05/06/18 0618 05/07/18 0516 05/08/18 0355  WBC 15.4* 13.3* 13.2*  RBC 3.47* 3.39* 3.19*  HGB 9.3* 9.3* 8.8*  HCT 30.7* 29.9* 27.4*  MCV 88.5 88.2 85.9  MCH 26.8 27.4 27.6  MCHC 30.3 31.1 32.1  RDW 14.6 14.6 14.3  PLT 372 323 302    Cardiac Enzymes Recent Labs  Lab 05/06/18 0618 05/06/18 1134 05/06/18 1750  TROPONINI <0.03 <0.03 <0.03    Recent Labs  Lab 05/06/18 0354  TROPIPOC 0.02     BNP Recent Labs  Lab 05/06/18 0328  BNP  379.3*     DDimer No results for input(s): DDIMER in the last 168 hours.   Radiology    No results found.  Cardiac Studies   Echo 03/17/2018 LV EF: 60% -   65%  Study Conclusions  - Left ventricle: The cavity size was normal. Systolic function was   normal. The estimated ejection fraction was in the range of 60%   to 65%. Wall motion was normal; there were no regional wall   motion abnormalities. There was no evidence of elevated   ventricular filling pressure by Doppler parameters. - Left atrium: The atrium was moderately dilated.  Impressions:  - No cardiac source of emboli was indentified.  Patient Profile     77 y.o. male with PMH of HTN, DM II, former tobacco abuse, CVA, osteomyelitis and persistent afib, recently possible stroke and osteomyelitis requiring L transmetatarsal amputation in 7/19 presented with acute on chronic diastolic HF.    Assessment & Plan    1. Acute on chronic diastolic HF:   - approaching euvolemic level, potassium low at 3.1, repleting. I/O -5L. Weight down 6 lbs.     - Although yesterday's note mentioned increase IV lasix to 60mg  BID, however, he appears to urinate quite well on the 40mg  BID, will continue current dose. Likely will achieve dry weight in 1-2 days.   2. PAF: Newly diagnosed in June, given recent CVA, continue IV heparin prior to upcoming foot surgery. Restart eliquis after surgery as soon as possible  3. S/p transmetartarsal amputation 7/19 for osteomyelitis: pending surgery this Wednesday, plan to switch back to eliquis after that.   4. H/o CVA: admitted for L sided facial drooping in June, CT and MRI was negative for acute CVA. Found to have new afib during the same hospitalization.   5. Anemia: no significant bleeding from foot.       For questions or updates, please contact [facility] Please consult [email or web address] for contact info under Cardiology/STEMI.      Hilbert Corrigan, PA  05/08/2018, 7:55 AM    I have seen and examined the patient along with Almyra Deforest, PA.  I have reviewed the chart, notes and new data.  I agree with PA/NP's note.  Key new complaints: still dyspneic, but improving. Key examination changes: no edema, excellent UO, irregular rhythm Key new findings / data: Creat improved to 1.29, K 3.1, WBC remains high, Hgb essentially unchanged.  PLAN: "Dry weight" uncertain. He has steadily lost weight over last 2-3 years. BNP is high (NTproBNP in house, difficult to compare to outpt BNP). Plan to DC IV heparin for foot surgery Wednesay, restart Eliquis as soon as safe postop (preferably within 2h in view of recent CVA and very recent DCCV).  Sanda Klein, MD, [place] [phone] 05/08/2018, 10:33 AM

## 2018-05-08 NOTE — Progress Notes (Signed)
ANTICOAGULATION CONSULT NOTE - Follow Up Consult  Pharmacy Consult for heparin Indication: atrial fibrillation  Labs: Recent Labs    05/06/18 0328 05/06/18 0618  05/06/18 1134  05/06/18 1550 05/06/18 1750 05/07/18 0516 05/07/18 1955 05/08/18 0355  HGB 9.8* 9.3*  --   --   --   --   --  9.3*  --  8.8*  HCT 31.6* 30.7*  --   --   --   --   --  29.9*  --  27.4*  PLT 408* 372  --   --   --   --   --  323  --  302  APTT  --   --    < >  --   --  45*  --   --  36 39*  HEPARINUNFRC  --   --    < >  --    < >  --   --  0.79* 0.38 0.38  CREATININE 1.47* 1.53*  --   --   --   --   --   --   --  1.29*  TROPONINI  --  <0.03  --  <0.03  --   --  <0.03  --   --   --    < > = values in this interval not displayed.    Assessment: 77yo male remains subtherapeutic on heparin with very little change in PTT despite rate increases; Hgb down some, RN reports no signs of bleeding.  Goal of Therapy:  aPTT 66-102 seconds   Plan:  Will increase heparin gtt by 2-3 units/kg/hr to 1900 units/hr and check PTT in 6 hours.    Wynona Neat, PharmD, BCPS  05/08/2018,6:08 AM

## 2018-05-08 NOTE — Progress Notes (Signed)
ANTICOAGULATION CONSULT NOTE - Follow Up Consult  Pharmacy Consult for Heparin Indication: atrial fibrillation  Allergies  Allergen Reactions  . Sulfa Antibiotics Rash    Patient Measurements: Height: 6\' 1"  (185.4 cm) Weight: 202 lb 6.1 oz (91.8 kg) IBW/kg (Calculated) : 79.9 Heparin Dosing Weight:  94.3 kg  Vital Signs: Temp: 98.4 F (36.9 C) (08/12 1132) Temp Source: Oral (08/12 1132) BP: 118/66 (08/12 1132) Pulse Rate: 61 (08/12 1132)  Labs: Recent Labs    05/06/18 0328 05/06/18 0618  05/06/18 1134  05/06/18 1750 05/07/18 0516 05/07/18 1955 05/08/18 0355 05/08/18 1210  HGB 9.8* 9.3*  --   --   --   --  9.3*  --  8.8*  --   HCT 31.6* 30.7*  --   --   --   --  29.9*  --  27.4*  --   PLT 408* 372  --   --   --   --  323  --  302  --   APTT  --   --    < >  --    < >  --   --  36 39* 45*  HEPARINUNFRC  --   --    < >  --    < >  --  0.79* 0.38 0.38  --   CREATININE 1.47* 1.53*  --   --   --   --   --   --  1.29*  --   TROPONINI  --  <0.03  --  <0.03  --  <0.03  --   --   --   --    < > = values in this interval not displayed.    Estimated Creatinine Clearance: 56.8 mL/min (A) (by C-G formula based on SCr of 1.29 mg/dL (H)).   Assessment:  Anticoag: heparin for Afib. On eliquis last dose 8/9 2000. Switching to heparin d/t bleed from foot stump on Eliquis. - Heparin restarted 8/10 and stopped at 1800 d/t bleeding from wound - 8/11: Re-starting heparin 1400 units/hr - 8/12: Hgb down to 8.8. Plts down to 302. APTT 45 lower than expected on 20 units/kg/hr. RN reports NO foot bleeding today with dressing change.  Goal of Therapy:  aPTT 66-102 seconds  HL 0.3-0.7 Monitor platelets by anticoagulation protocol: Yes   Plan:  Heparin increase to 2100 units/hr cautiously.  Recheck aPTT/HL in 6 hrs. Daily HL and CBC  Crystal S. Alford Highland, PharmD, BCPS Clinical Staff Pharmacist Pager [phone]  Eilene Ghazi Stillinger 05/08/2018,2:06 PM

## 2018-05-08 NOTE — Progress Notes (Signed)
ANTICOAGULATION CONSULT NOTE - Follow Up Consult  Pharmacy Consult for Heparin Indication: atrial fibrillation  Allergies  Allergen Reactions  . Sulfa Antibiotics Rash    Patient Measurements: Height: 6\' 1"  (185.4 cm) Weight: 202 lb 6.1 oz (91.8 kg) IBW/kg (Calculated) : 79.9 Heparin Dosing Weight:  94.3 kg  Vital Signs: Temp: 98.6 F (37 C) (08/12 1951) Temp Source: Oral (08/12 1951) BP: 124/63 (08/12 1951) Pulse Rate: 64 (08/12 1951)  Labs: Recent Labs    05/06/18 0328 05/06/18 0618  05/06/18 1134  05/06/18 1750 05/07/18 0516 05/07/18 1955 05/08/18 0355 05/08/18 1210 05/08/18 2018  HGB 9.8* 9.3*  --   --   --   --  9.3*  --  8.8*  --   --   HCT 31.6* 30.7*  --   --   --   --  29.9*  --  27.4*  --   --   PLT 408* 372  --   --   --   --  323  --  302  --   --   APTT  --   --    < >  --    < >  --   --  36 39* 45* 54*  HEPARINUNFRC  --   --    < >  --    < >  --  0.79* 0.38 0.38  --  0.35  CREATININE 1.47* 1.53*  --   --   --   --   --   --  1.29*  --   --   TROPONINI  --  <0.03  --  <0.03  --  <0.03  --   --   --   --   --    < > = values in this interval not displayed.    Estimated Creatinine Clearance: 56.8 mL/min (A) (by C-G formula based on SCr of 1.29 mg/dL (H)).   Assessment:  Anticoag: heparin for Afib. On eliquis last dose 8/9 2000. Switching to heparin d/t bleed from foot stump on Eliquis.  -Heparin restarted 8/10 and stopped at 1800 d/t bleeding from wound  Iv heparin was restarted on 8/11. aPTT and HL this evening do not correlate yet. APTT subtherapeutic at 54. Discussed with RN. No bleeding noted.   Goal of Therapy:  aPTT 66-102 seconds  HL 0.3-0.7 Monitor platelets by anticoagulation protocol: Yes   Plan:  Heparin increase to 2200 units/hr cautiously.  Recheck aPTT/HL in AM  Daily HL and CBC   Albertina Parr, PharmD., BCPS Clinical Pharmacist Clinical phone for 05/08/18 until 3:30pm: [phone] If after 3:30pm, please refer to [facility]  for unit-specific pharmacist

## 2018-05-08 NOTE — Progress Notes (Signed)
Cardiology Meng aware potassium 3.1

## 2018-05-08 NOTE — Progress Notes (Signed)
Signed and held orders are from prior to this admission  Will not release

## 2018-05-09 LAB — BASIC METABOLIC PANEL
Anion gap: 11 (ref 5–15)
BUN: 18 mg/dL (ref 8–23)
CO2: 29 mmol/L (ref 22–32)
Calcium: 8.7 mg/dL — ABNORMAL LOW (ref 8.9–10.3)
Chloride: 94 mmol/L — ABNORMAL LOW (ref 98–111)
Creatinine, Ser: 1.32 mg/dL — ABNORMAL HIGH (ref 0.61–1.24)
GFR calc Af Amer: 60 mL/min — ABNORMAL LOW (ref >60)
GFR calc non Af Amer: 51 mL/min — ABNORMAL LOW (ref >60)
Glucose, Bld: 188 mg/dL — ABNORMAL HIGH (ref 70–99)
Potassium: 3.8 mmol/L (ref 3.5–5.1)
Sodium: 134 mmol/L — ABNORMAL LOW (ref 135–145)

## 2018-05-09 LAB — GLUCOSE, CAPILLARY
Glucose-Capillary: 166 mg/dL — ABNORMAL HIGH (ref 70–99)
Glucose-Capillary: 212 mg/dL — ABNORMAL HIGH (ref 70–99)
Glucose-Capillary: 171 mg/dL — ABNORMAL HIGH (ref 70–99)
Glucose-Capillary: 180 mg/dL — ABNORMAL HIGH (ref 70–99)

## 2018-05-09 LAB — HEPARIN LEVEL (UNFRACTIONATED)
Heparin Unfractionated: 0.38 IU/mL (ref 0.30–0.70)
Heparin Unfractionated: 0.28 IU/mL — ABNORMAL LOW (ref 0.30–0.70)
Heparin Unfractionated: 0.54 IU/mL (ref 0.30–0.70)

## 2018-05-09 LAB — APTT
aPTT: 56 seconds — ABNORMAL HIGH (ref 24–36)
aPTT: 54 seconds — ABNORMAL HIGH (ref 24–36)
aPTT: 85 seconds — ABNORMAL HIGH (ref 24–36)

## 2018-05-09 LAB — CBC
HCT: 27.9 % — ABNORMAL LOW (ref 39.0–52.0)
Hemoglobin: 8.8 g/dL — ABNORMAL LOW (ref 13.0–17.0)
MCH: 27.1 pg (ref 26.0–34.0)
MCHC: 31.5 g/dL (ref 30.0–36.0)
MCV: 85.8 fL (ref 78.0–100.0)
Platelets: 298 10*3/uL (ref 150–400)
RBC: 3.25 MIL/uL — ABNORMAL LOW (ref 4.22–5.81)
RDW: 14.2 % (ref 11.5–15.5)
WBC: 12.5 10*3/uL — ABNORMAL HIGH (ref 4.0–10.5)

## 2018-05-09 MED ORDER — CEFAZOLIN SODIUM-DEXTROSE 2-4 GM/100ML-% IV SOLN
2.0000 g | INTRAVENOUS | Status: AC
  Administered 2018-05-10: 2 g via INTRAVENOUS
  Filled 2018-05-09 (×2): qty 100

## 2018-05-09 MED ORDER — CHLORHEXIDINE GLUCONATE 4 % EX LIQD
60.0000 mL | Freq: Once | CUTANEOUS | Status: AC
  Administered 2018-05-09: 4 via TOPICAL
  Filled 2018-05-09: qty 60

## 2018-05-09 MED ORDER — FUROSEMIDE 10 MG/ML IJ SOLN
60.0000 mg | Freq: Two times a day (BID) | INTRAMUSCULAR | Status: DC
  Administered 2018-05-09: 60 mg via INTRAVENOUS
  Filled 2018-05-09: qty 6

## 2018-05-09 NOTE — Progress Notes (Signed)
Inpatient Diabetes Program Recommendations  AACE/ADA: New Consensus Statement on Inpatient Glycemic Control (2015)  Target Ranges:  Prepandial:   less than 140 mg/dL      Peak postprandial:   less than 180 mg/dL (1-2 hours)      Critically ill patients:  140 - 180 mg/dL   Lab Results  Component Value Date   GLUCAP 212 (H) 05/09/2018   HGBA1C 7.6 (H) 03/17/2018    Review of Glycemic Control Results for SUMEDH, SHINSATO (MRN [id number]) as of 05/09/2018 14:50  Ref. Range 05/07/2018 21:15 05/08/2018 07:44 05/08/2018 11:30 05/08/2018 16:45 05/08/2018 21:17 05/09/2018 08:02 05/09/2018 11:38  Glucose-Capillary Latest Ref Range: 70 - 99 mg/dL 213 (H) 183 (H) 226 (H) 200 (H) 176 (H) 166 (H) 212 (H)    Diabetes history: Type 2 DM  Outpatient Diabetes medications: Amaryl 4 mg daily, Metformin 1000 mg bid, Januvia 100 mg daily Current orders for Inpatient glycemic control:  Novolog sensitive tid with meals, Tradjenta 5 mg daily Inpatient Diabetes Program Recommendations:   While in the hospital, consider adding Lantus 14 units daily.  Thanks,  Adah Perl, RN, BC-ADM Inpatient Diabetes Coordinator Pager [phone] (8a-5p)

## 2018-05-09 NOTE — Progress Notes (Signed)
Progress Note  Patient Name: Kevin [name] Date of Encounter: 05/09/2018  Primary Cardiologist: Lauree Chandler, MD   Subjective   Breathing has improved but he still requiring supplemental oxygen and is mildly tachypneic at rest.  He is able to lie supine in bed. Good urine output but little net diuresis over the last 24 hours. Remains in sinus rhythm.   Surgery scheduled tomorrow around 11.  Inpatient Medications    Scheduled Meds: . diltiazem  240 mg Oral Daily  . FLUoxetine  20 mg Oral Daily  . fluticasone  2 spray Each Nare QHS  . furosemide  60 mg Intravenous BID  . insulin aspart  0-9 Units Subcutaneous TID WC  . linagliptin  5 mg Oral Daily  . mesalamine  0.75 g Oral BID  . metoprolol succinate  200 mg Oral Daily  . multivitamin with minerals  1 tablet Oral Daily  . mupirocin cream   Topical Daily  . rosuvastatin  5 mg Oral Daily  . tamsulosin  0.4 mg Oral BID   Continuous Infusions: . heparin 2,300 Units/hr (05/09/18 0719)   PRN Meds: acetaminophen **OR** acetaminophen, ondansetron **OR** ondansetron (ZOFRAN) IV   Vital Signs    Vitals:   05/09/18 0017 05/09/18 0523 05/09/18 0659 05/09/18 1139  BP: (!) 120/59 128/62  124/65  Pulse: 65 69  64  Resp: 20 20    Temp: 98.4 F (36.9 C) 98.5 F (36.9 C)  98.6 F (37 C)  TempSrc: Oral Oral  Oral  SpO2: 94% 93%  92%  Weight:   90 kg   Height:        Intake/Output Summary (Last 24 hours) at 05/09/2018 1156 Last data filed at 05/09/2018 0919 Gross per 24 hour  Intake 1665.68 ml  Output 1825 ml  Net -159.32 ml   Filed Weights   05/07/18 0404 05/08/18 0434 05/09/18 0659  Weight: 92.3 kg 91.8 kg 90 kg    Telemetry    Sinus rhythm- Personally Reviewed  ECG    No new tracing- Personally Reviewed  Physical Exam  Mildly tachypneic GEN: No acute distress.   Neck:  5-6 cm elevation JVD Cardiac: RRR, no murmurs, rubs, or gallops.  Respiratory: Clear to auscultation bilaterally. GI: Soft,  nontender, non-distended  MS: No edema; No deformity. Neuro:  Nonfocal  Psych: Normal affect   Labs    Chemistry Recent Labs  Lab 05/06/18 0328 05/06/18 0618 05/08/18 0355 05/09/18 0534  NA 134* 137 133* 134*  K 3.9 4.0 3.1* 3.8  CL 100 101 94* 94*  CO2 23 26 29 29   GLUCOSE 234* 198* 176* 188*  BUN 20 19 17 18   CREATININE 1.47* 1.53* 1.29* 1.32*  CALCIUM 8.9 8.9 8.5* 8.7*  PROT 7.0 7.2  --   --   ALBUMIN 3.2* 3.2*  --   --   AST 22 18  --   --   ALT 22 23  --   --   ALKPHOS 111 95  --   --   BILITOT 1.0 0.8  --   --   GFRNONAA 45* 43* 53* 51*  GFRAA 52* 50* >60 60*  ANIONGAP 11 10 10 11      Hematology Recent Labs  Lab 05/07/18 0516 05/08/18 0355 05/09/18 0534  WBC 13.3* 13.2* 12.5*  RBC 3.39* 3.19* 3.25*  HGB 9.3* 8.8* 8.8*  HCT 29.9* 27.4* 27.9*  MCV 88.2 85.9 85.8  MCH 27.4 27.6 27.1  MCHC 31.1 32.1 31.5  RDW 14.6  14.3 14.2  PLT 323 302 298    Cardiac Enzymes Recent Labs  Lab 05/06/18 0618 05/06/18 1134 05/06/18 1750  TROPONINI <0.03 <0.03 <0.03    Recent Labs  Lab 05/06/18 0354  TROPIPOC 0.02     BNP Recent Labs  Lab 05/06/18 0328  BNP 379.3*     DDimer No results for input(s): DDIMER in the last 168 hours.   Radiology    No results found.  Cardiac Studies   Echo 03/17/2018 LV EF: 60% - 65%  Study Conclusions  - Left ventricle: The cavity size was normal. Systolic function was normal. The estimated ejection fraction was in the range of 60% to 65%. Wall motion was normal; there were no regional wall motion abnormalities. There was no evidence of elevated ventricular filling pressure by Doppler parameters. - Left atrium: The atrium was moderately dilated.  Impressions:  - No cardiac source of emboli was indentified.  Patient Profile     77 y.o. male with PMH of HTN, DM II, former tobacco abuse, CVA, osteomyelitis and persistent afib, recently possible stroke and osteomyelitis requiring L transmetatarsal  amputation in 7/19 presented with acute on chronic diastolic HF.    Assessment & Plan    1. CHF: Still appears to be slightly hypervolemic judging by his breathing pattern and physical exam.  We will temporarily increase his dose of furosemide.  The renal function has actually improved with diuresis, now is at baseline, and his potassium is now normal. 2. PAFib: Recently diagnosed/cardioversion and recent stroke presumed embolic.  Intravenous heparin bridge preceding his planned surgery tomorrow.  Would like to resume anticoagulation as soon as possible after surgery. 3.  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 We will follow-up postop.  For questions or updates, please contact [place] Please consult [email or web address] for contact info under Cardiology/STEMI.      Signed, Sanda Klein, MD  05/09/2018, 11:56 AM

## 2018-05-09 NOTE — Progress Notes (Signed)
ANTICOAGULATION CONSULT NOTE - Follow Up Consult  Pharmacy Consult for Heparin Indication: atrial fibrillation  Allergies  Allergen Reactions  . Sulfa Antibiotics Rash   Patient Measurements: Height: 6\' 1"  (185.4 cm) Weight: 198 lb 6.6 oz (90 kg) IBW/kg (Calculated) : 79.9 Heparin Dosing Weight:  94.3 kg  Vital Signs: Temp: 98.7 F (37.1 C) (08/13 1919) Temp Source: Oral (08/13 1919) BP: 123/73 (08/13 1919) Pulse Rate: 66 (08/13 1919)  Labs: Recent Labs    05/07/18 0516  05/08/18 0355  05/09/18 0534 05/09/18 1453 05/09/18 2208  HGB 9.3*  --  8.8*  --  8.8*  --   --   HCT 29.9*  --  27.4*  --  27.9*  --   --   PLT 323  --  302  --  298  --   --   APTT  --    < > 39*   < > 56* 54* 85*  HEPARINUNFRC 0.79*   < > 0.38   < > 0.38 0.28* 0.54  CREATININE  --   --  1.29*  --  1.32*  --   --    < > = values in this interval not displayed.    Estimated Creatinine Clearance: 55.5 mL/min (A) (by C-G formula based on SCr of 1.32 mg/dL (H)).   Assessment:  Anticoag: heparin for Afib. On eliquis last dose 8/9 2000. Switching to heparin d/t bleed from foot stump on Eliquis.  -Heparin restarted 8/10 and stopped at 1800 d/t bleeding from wound  Iv heparin was restarted on 8/11. aPTT and HL this evening do not correlate yet. APTT subtherapeutic at 54. Discussed with RN. No bleeding noted.   8/13 AM update: aPTT and heparin level are both therapeutic and are now correlating. Will now use heparin level only to dose.   Goal of Therapy:  Heparin level 0.3-0.7 units/mL Monitor platelets by anticoagulation protocol: Yes   Plan:  Cont heparin at 2550 units/hr Confirmatory heparin level with AM labs OR 8/14  Narda Bonds, PharmD, [place] Clinical Pharmacist Phone: [phone]

## 2018-05-09 NOTE — Progress Notes (Signed)
Patient ID: Kevin [name], male   DOB: [birth year], 77 y.o.   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 ED evaluation noted elevated BNP of 379 with CHF and x-ray and hypoxia and required IV diuretics with BiPAP treatment in the ED with improvement and admitted for acute on chronic CHF.   Assessment & Plan:   Acute on chronic diastolic CHF: - EF in June 2019 was 60 to 65%.  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 Given last week prior to cardioversion and had dressing change and patient states that clot was removed.   DVT prophylaxis: Heparin drip Code Status: full code  Family Communication: no family at bedside this am Disposition Plan: not yet stable for dc, surgery tomorrow   Consultants:   Cardiology   Procedures:   None  Antimicrobials:   None    Subjective: No overnight events.  Objective: Vitals:   05/09/18 0017 05/09/18 0523 05/09/18 0659 05/09/18 1139  BP: (!) 120/59 128/62  124/65  Pulse: 65 69  64  Resp: 20  20    Temp: 98.4 F (36.9 C) 98.5 F (36.9 C)  98.6 F (37 C)  TempSrc: Oral Oral  Oral  SpO2: 94% 93%  92%  Weight:   90 kg   Height:        Intake/Output Summary (Last 24 hours) at 05/09/2018 1821 Last data filed at 05/09/2018 1624 Gross per 24 hour  Intake 963.78 ml  Output 2075 ml  Net -1111.22 ml   Filed Weights   05/07/18 0404 05/08/18 0434 05/09/18 0659  Weight: 92.3 kg 91.8 kg 90 kg    Examination:  Physical Exam  Constitutional: Appears well-developed and well-nourished. No distress.  HENT: Normocephalic. External right and left ear normal. Oropharynx is clear and moist.  Eyes: Conjunctivae and EOM are normal. PERRLA, no scleral icterus.  Neck: Normal ROM. Neck supple. No JVD. No tracheal deviation. No thyromegaly.  CVS: Rate controlled, S1/S2 + Pulmonary: No wheezing, no rhonchi  Abdominal: Soft. BS +,  no distension, tenderness, rebound or guarding.  Musculoskeletal: Normal range of motion. No edema and no tenderness.  Lymphadenopathy: No lymphadenopathy noted, cervical, inguinal. Neuro: Alert. Normal reflexes, muscle tone coordination. No cranial nerve deficit. Skin: Skin is warm and dry. Left foot dressing in place  Psychiatric: Normal mood and affect. Behavior, judgment, thought content normal.     Data Reviewed: I have personally reviewed following labs and imaging studies  CBC: Recent Labs  Lab 05/06/18 0328 05/06/18 0618 05/07/18 0516 05/08/18 0355 05/09/18  0534  WBC 20.2* 15.4* 13.3* 13.2* 12.5*  NEUTROABS 16.7* 12.6*  --   --   --   HGB 9.8* 9.3* 9.3* 8.8* 8.8*  HCT 31.6* 30.7* 29.9* 27.4* 27.9*  MCV 88.8 88.5 88.2 85.9 85.8  PLT 408* 372 323 302 175   Basic Metabolic Panel: Recent Labs  Lab 05/03/18 1301 05/06/18 0328 05/06/18 0618 05/08/18 0355 05/09/18 0534  NA 140 134* 137 133* 134*  K 4.3 3.9 4.0 3.1* 3.8  CL 98 100 101 94* 94*  CO2 26 23 26 29 29   GLUCOSE 161* 234* 198* 176* 188*  BUN 18 20 19 17 18   CREATININE 1.37* 1.47*  1.53* 1.29* 1.32*  CALCIUM 9.5 8.9 8.9 8.5* 8.7*  MG  --   --  1.5* 1.7  --    GFR: Estimated Creatinine Clearance: 55.5 mL/min (A) (by C-G formula based on SCr of 1.32 mg/dL (H)). Liver Function Tests: Recent Labs  Lab 05/06/18 0328 05/06/18 0618  AST 22 18  ALT 22 23  ALKPHOS 111 95  BILITOT 1.0 0.8  PROT 7.0 7.2  ALBUMIN 3.2* 3.2*   No results for input(s): LIPASE, AMYLASE in the last 168 hours. No results for input(s): AMMONIA in the last 168 hours. Coagulation Profile: No results for input(s): INR, PROTIME in the last 168 hours. Cardiac Enzymes: Recent Labs  Lab 05/06/18 0618 05/06/18 1134 05/06/18 1750  TROPONINI <0.03 <0.03 <0.03   BNP (last 3 results) Recent Labs    04/27/18 1236  PROBNP 3,814*   HbA1C: No results for input(s): HGBA1C in the last 72 hours. CBG: Recent Labs  Lab 05/08/18 1645 05/08/18 2117 05/09/18 0802 05/09/18 1138 05/09/18 1624  GLUCAP 200* 176* 166* 212* 171*   Lipid Profile: No results for input(s): CHOL, HDL, LDLCALC, TRIG, CHOLHDL, LDLDIRECT in the last 72 hours. Thyroid Function Tests: No results for input(s): TSH, T4TOTAL, FREET4, T3FREE, THYROIDAB in the last 72 hours. Anemia Panel: No results for input(s): VITAMINB12, FOLATE, FERRITIN, TIBC, IRON, RETICCTPCT in the last 72 hours. Urine analysis:  Sepsis Labs: @LABRCNTIP (procalcitonin:4,lacticidven:4)   )No results found for this or any previous visit (from the past 240 hour(s)).    Radiology Studies: Dg Chest Port 1 View Result Date: 05/06/2018 Bilateral midlung and bibasilar airspace opacities raise concern for pulmonary edema, with a small left pleural effusion, mildly worsened from the prior study. Mild vascular congestion noted.     Scheduled Meds: . diltiazem  240 mg Oral Daily  . FLUoxetine  20 mg Oral Daily  . fluticasone  2 spray Each Nare QHS  . furosemide  40 mg Intravenous BID  . insulin aspart  0-9 Units Subcutaneous TID WC  . linagliptin  5 mg  Oral Daily  . mesalamine  0.75 g Oral BID  . metoprolol succinate  200 mg Oral Daily  . multivitamin  1 tablet Oral Daily  . mupirocin cream   Topical Daily  . rosuvastatin  5 mg Oral Daily  . tamsulosin  0.4 mg Oral BID   Continuous Infusions: . heparin 2,550 Units/hr (05/09/18 1737)     LOS: 2 days    Time spent: 25 minutes Greater than 50% of the time spent on counseling and coordinating the care.   Leisa Lenz, MD [facility] Pager [phone]  If 7PM-7AM, please contact night-coverage [email or web address] Password [facility] 05/09/2018, 6:21 PM

## 2018-05-09 NOTE — Progress Notes (Signed)
ANTICOAGULATION CONSULT NOTE - Follow Up Consult  Pharmacy Consult for Heparin Indication: atrial fibrillation  Allergies  Allergen Reactions  . Sulfa Antibiotics Rash    Patient Measurements: Height: 6\' 1"  (185.4 cm) Weight: 198 lb 6.6 oz (90 kg) IBW/kg (Calculated) : 79.9 Heparin Dosing Weight: 94.3 kg  Vital Signs: Temp: 98.6 F (37 C) (08/13 1139) Temp Source: Oral (08/13 1139) BP: 124/65 (08/13 1139) Pulse Rate: 64 (08/13 1139)  Labs: Recent Labs    05/06/18 1750  05/07/18 0516  05/08/18 0355  05/08/18 2018 05/09/18 0534 05/09/18 1453  HGB  --    < > 9.3*  --  8.8*  --   --  8.8*  --   HCT  --   --  29.9*  --  27.4*  --   --  27.9*  --   PLT  --   --  323  --  302  --   --  298  --   APTT  --   --   --    < > 39*   < > 54* 56* 54*  HEPARINUNFRC  --   --  0.79*   < > 0.38  --  0.35 0.38 0.28*  CREATININE  --   --   --   --  1.29*  --   --  1.32*  --   TROPONINI <0.03  --   --   --   --   --   --   --   --    < > = values in this interval not displayed.    Estimated Creatinine Clearance: 55.5 mL/min (A) (by C-G formula based on SCr of 1.32 mg/dL (H)).  Assessment: Anticoag: heparin for Afib. On eliquis last dose 8/9 2000. Switching to heparin d/t bleed from foot stump on Eliquis. - Heparin restarted 8/10 and stopped at 1800 d/t bleeding from wound - 8/13: aptt 56, HL 0.38. Hgb 8.8 stable overnight. Plts 298 dropping. RN reports drops of blood on bedding but has not yet changed dressing to assess bleeding. PM: HL 0.28 and aPTT 54 lower.  RN has not changed dressing to assess bleeding yet today.  Heparin level 0.3-0.7 units/ml Monitor platelets by anticoagulation protocol: Yes   Plan:  Increase IV heparin to 2550 units/hr Check HL and aPTT in 6 hrs Plan OR Wed. F/u when to stop heparin  Crystal S. Alford Highland, PharmD, BCPS Clinical Staff Pharmacist Pager [phone]  Eilene Ghazi Stillinger 05/09/2018,4:02 PM

## 2018-05-09 NOTE — Progress Notes (Signed)
ANTICOAGULATION CONSULT NOTE - Follow Up Consult  Pharmacy Consult for Heparin Indication: atrial fibrillation  Allergies  Allergen Reactions  . Sulfa Antibiotics Rash   Patient Measurements: Height: 6\' 1"  (185.4 cm) Weight: 202 lb 6.1 oz (91.8 kg) IBW/kg (Calculated) : 79.9 Heparin Dosing Weight:  94.3 kg  Vital Signs: Temp: 98.5 F (36.9 C) (08/13 0523) Temp Source: Oral (08/13 0523) BP: 128/62 (08/13 0523) Pulse Rate: 69 (08/13 0523)  Labs: Recent Labs    05/06/18 1134  05/06/18 1750  05/07/18 0516  05/08/18 0355 05/08/18 1210 05/08/18 2018 05/09/18 0534  HGB  --   --   --    < > 9.3*  --  8.8*  --   --  8.8*  HCT  --   --   --   --  29.9*  --  27.4*  --   --  27.9*  PLT  --   --   --   --  323  --  302  --   --  298  APTT  --    < >  --   --   --    < > 39* 45* 54* 56*  HEPARINUNFRC  --    < >  --   --  0.79*   < > 0.38  --  0.35 0.38  CREATININE  --   --   --   --   --   --  1.29*  --   --   --   TROPONINI <0.03  --  <0.03  --   --   --   --   --   --   --    < > = values in this interval not displayed.    Estimated Creatinine Clearance: 56.8 mL/min (A) (by C-G formula based on SCr of 1.29 mg/dL (H)).   Assessment:  Anticoag: heparin for Afib. On eliquis last dose 8/9 2000. Switching to heparin d/t bleed from foot stump on Eliquis.  -Heparin restarted 8/10 and stopped at 1800 d/t bleeding from wound  Iv heparin was restarted on 8/11. aPTT and HL this evening do not correlate yet. APTT subtherapeutic at 54. Discussed with RN. No bleeding noted.   8/13 AM update: aPTT still below goal, appears to be small amount of apixaban influence on the heparin level  Goal of Therapy:  aPTT 66-102 seconds  HL 0.3-0.7 units/mL Monitor platelets by anticoagulation protocol: Yes   Plan:  Inc heparin to 2300 units/hr 1500 aPTT/HL  Narda Bonds, PharmD, BCPS Clinical Pharmacist Phone: [phone]

## 2018-05-10 ENCOUNTER — Encounter (HOSPITAL_COMMUNITY): Payer: Self-pay | Admitting: *Deleted

## 2018-05-10 ENCOUNTER — Inpatient Hospital Stay (HOSPITAL_COMMUNITY): Payer: Medicare Other | Admitting: Anesthesiology

## 2018-05-10 ENCOUNTER — Ambulatory Visit (HOSPITAL_COMMUNITY): Admission: RE | Admit: 2018-05-10 | Payer: Medicare Other | Source: Ambulatory Visit | Admitting: Orthopedic Surgery

## 2018-05-10 ENCOUNTER — Encounter (HOSPITAL_COMMUNITY)
Admission: EM | Disposition: A | Discharge status: Home / Self Care | Payer: Self-pay | Source: Home / Self Care | Attending: Internal Medicine

## 2018-05-10 DIAGNOSIS — T8781 Dehiscence of amputation stump: Secondary | ICD-10-CM

## 2018-05-10 HISTORY — PX: STUMP REVISION: SHX6102

## 2018-05-10 LAB — BASIC METABOLIC PANEL
Anion gap: 12 (ref 5–15)
BUN: 24 mg/dL — ABNORMAL HIGH (ref 8–23)
CO2: 28 mmol/L (ref 22–32)
Calcium: 8.9 mg/dL (ref 8.9–10.3)
Chloride: 93 mmol/L — ABNORMAL LOW (ref 98–111)
Creatinine, Ser: 1.34 mg/dL — ABNORMAL HIGH (ref 0.61–1.24)
GFR calc Af Amer: 59 mL/min — ABNORMAL LOW (ref >60)
GFR calc non Af Amer: 51 mL/min — ABNORMAL LOW (ref >60)
Glucose, Bld: 176 mg/dL — ABNORMAL HIGH (ref 70–99)
Potassium: 3.3 mmol/L — ABNORMAL LOW (ref 3.5–5.1)
Sodium: 133 mmol/L — ABNORMAL LOW (ref 135–145)

## 2018-05-10 LAB — CBC
HCT: 28.9 % — ABNORMAL LOW (ref 39.0–52.0)
Hemoglobin: 9.1 g/dL — ABNORMAL LOW (ref 13.0–17.0)
MCH: 26.8 pg (ref 26.0–34.0)
MCHC: 31.5 g/dL (ref 30.0–36.0)
MCV: 85 fL (ref 78.0–100.0)
Platelets: 343 10*3/uL (ref 150–400)
RBC: 3.4 MIL/uL — ABNORMAL LOW (ref 4.22–5.81)
RDW: 14.1 % (ref 11.5–15.5)
WBC: 10.8 10*3/uL — ABNORMAL HIGH (ref 4.0–10.5)

## 2018-05-10 LAB — GLUCOSE, CAPILLARY
Glucose-Capillary: 168 mg/dL — ABNORMAL HIGH (ref 70–99)
Glucose-Capillary: 178 mg/dL — ABNORMAL HIGH (ref 70–99)
Glucose-Capillary: 194 mg/dL — ABNORMAL HIGH (ref 70–99)
Glucose-Capillary: 387 mg/dL — ABNORMAL HIGH (ref 70–99)
Glucose-Capillary: 425 mg/dL — ABNORMAL HIGH (ref 70–99)
Glucose-Capillary: 382 mg/dL — ABNORMAL HIGH (ref 70–99)

## 2018-05-10 LAB — SURGICAL PCR SCREEN
MRSA, PCR: NEGATIVE
Staphylococcus aureus: NEGATIVE

## 2018-05-10 LAB — HEPARIN LEVEL (UNFRACTIONATED): Heparin Unfractionated: 0.44 IU/mL (ref 0.30–0.70)

## 2018-05-10 SURGERY — REVISION, AMPUTATION SITE
Anesthesia: General | Laterality: Left

## 2018-05-10 MED ORDER — HYDROMORPHONE HCL 1 MG/ML IJ SOLN: 0.5000 mg | INTRAMUSCULAR | Status: DC | PRN

## 2018-05-10 MED ORDER — LACTATED RINGERS IV SOLN
INTRAVENOUS | Status: DC | PRN
  Administered 2018-05-10: 11:00:00 via INTRAVENOUS

## 2018-05-10 MED ORDER — FENTANYL CITRATE (PF) 250 MCG/5ML IJ SOLN
INTRAMUSCULAR | Status: DC | PRN
  Administered 2018-05-10: 50 ug via INTRAVENOUS

## 2018-05-10 MED ORDER — POTASSIUM CHLORIDE CRYS ER 20 MEQ PO TBCR
60.0000 meq | EXTENDED_RELEASE_TABLET | Freq: Once | ORAL | Status: AC
  Administered 2018-05-10: 60 meq via ORAL
  Filled 2018-05-10: qty 3

## 2018-05-10 MED ORDER — FUROSEMIDE 40 MG PO TABS
60.0000 mg | ORAL_TABLET | Freq: Two times a day (BID) | ORAL | Status: DC
  Administered 2018-05-10: 60 mg via ORAL
  Filled 2018-05-10: qty 1

## 2018-05-10 MED ORDER — OXYCODONE HCL 5 MG PO TABS
10.0000 mg | ORAL_TABLET | ORAL | Status: DC | PRN
  Filled 2018-05-10: qty 2

## 2018-05-10 MED ORDER — SODIUM CHLORIDE 0.9 % IV SOLN: INTRAVENOUS | Status: DC

## 2018-05-10 MED ORDER — FENTANYL CITRATE (PF) 250 MCG/5ML IJ SOLN
INTRAMUSCULAR | Status: AC
  Filled 2018-05-10: qty 5

## 2018-05-10 MED ORDER — ONDANSETRON HCL 4 MG/2ML IJ SOLN: 4.0000 mg | Freq: Four times a day (QID) | INTRAMUSCULAR | Status: DC | PRN

## 2018-05-10 MED ORDER — EPHEDRINE SULFATE 50 MG/ML IJ SOLN
INTRAMUSCULAR | Status: DC | PRN
  Administered 2018-05-10 (×2): 10 mg via INTRAVENOUS

## 2018-05-10 MED ORDER — LIDOCAINE HCL (CARDIAC) PF 100 MG/5ML IV SOSY
PREFILLED_SYRINGE | INTRAVENOUS | Status: DC | PRN
  Administered 2018-05-10: 60 mg via INTRAVENOUS

## 2018-05-10 MED ORDER — DOCUSATE SODIUM 100 MG PO CAPS
100.0000 mg | ORAL_CAPSULE | Freq: Two times a day (BID) | ORAL | Status: DC
  Administered 2018-05-10 – 2018-05-11 (×3): 100 mg via ORAL
  Filled 2018-05-10 (×3): qty 1

## 2018-05-10 MED ORDER — MIDAZOLAM HCL 2 MG/2ML IJ SOLN
INTRAMUSCULAR | Status: AC
  Filled 2018-05-10: qty 2

## 2018-05-10 MED ORDER — PROPOFOL 10 MG/ML IV BOLUS
INTRAVENOUS | Status: AC
  Filled 2018-05-10: qty 20

## 2018-05-10 MED ORDER — CEFAZOLIN SODIUM-DEXTROSE 1-4 GM/50ML-% IV SOLN
1.0000 g | Freq: Four times a day (QID) | INTRAVENOUS | Status: AC
  Administered 2018-05-10 – 2018-05-11 (×3): 1 g via INTRAVENOUS
  Filled 2018-05-10 (×4): qty 50

## 2018-05-10 MED ORDER — PROPOFOL 10 MG/ML IV BOLUS
INTRAVENOUS | Status: DC | PRN
  Administered 2018-05-10: 150 mg via INTRAVENOUS

## 2018-05-10 MED ORDER — BISACODYL 10 MG RE SUPP: 10.0000 mg | Freq: Every day | RECTAL | Status: DC | PRN

## 2018-05-10 MED ORDER — DEXAMETHASONE SODIUM PHOSPHATE 10 MG/ML IJ SOLN
INTRAMUSCULAR | Status: DC | PRN
  Administered 2018-05-10: 4 mg via INTRAVENOUS

## 2018-05-10 MED ORDER — ONDANSETRON HCL 4 MG PO TABS: 4.0000 mg | ORAL_TABLET | Freq: Four times a day (QID) | ORAL | Status: DC | PRN

## 2018-05-10 MED ORDER — FUROSEMIDE 40 MG PO TABS
60.0000 mg | ORAL_TABLET | Freq: Two times a day (BID) | ORAL | Status: DC
  Administered 2018-05-10 – 2018-05-11 (×3): 60 mg via ORAL
  Filled 2018-05-10 (×3): qty 1

## 2018-05-10 MED ORDER — EPHEDRINE 5 MG/ML INJ
INTRAVENOUS | Status: AC
  Filled 2018-05-10: qty 10

## 2018-05-10 MED ORDER — METHOCARBAMOL 500 MG PO TABS
500.0000 mg | ORAL_TABLET | Freq: Four times a day (QID) | ORAL | Status: DC | PRN
  Administered 2018-05-10 – 2018-05-11 (×2): 500 mg via ORAL
  Filled 2018-05-10: qty 1

## 2018-05-10 MED ORDER — METOCLOPRAMIDE HCL 5 MG/ML IJ SOLN: 5.0000 mg | Freq: Three times a day (TID) | INTRAMUSCULAR | Status: DC | PRN

## 2018-05-10 MED ORDER — ACETAMINOPHEN 325 MG PO TABS: 325.0000 mg | ORAL_TABLET | Freq: Four times a day (QID) | ORAL | Status: DC | PRN

## 2018-05-10 MED ORDER — OXYCODONE HCL 5 MG PO TABS
ORAL_TABLET | ORAL | Status: AC
  Filled 2018-05-10: qty 2

## 2018-05-10 MED ORDER — SODIUM CHLORIDE 0.9 % IR SOLN
Status: DC | PRN
  Administered 2018-05-10: 1000 mL

## 2018-05-10 MED ORDER — DEXTROSE 5 % IV SOLN
500.0000 mg | Freq: Four times a day (QID) | INTRAVENOUS | Status: DC | PRN
Start: 2018-05-10 — End: 2018-05-11

## 2018-05-10 MED ORDER — OXYCODONE HCL 5 MG PO TABS
5.0000 mg | ORAL_TABLET | ORAL | Status: DC | PRN
  Administered 2018-05-10 – 2018-05-11 (×3): 10 mg via ORAL
  Filled 2018-05-10: qty 2

## 2018-05-10 MED ORDER — MIDAZOLAM HCL 5 MG/5ML IJ SOLN
INTRAMUSCULAR | Status: DC | PRN
  Administered 2018-05-10: 1 mg via INTRAVENOUS

## 2018-05-10 MED ORDER — FENTANYL CITRATE (PF) 100 MCG/2ML IJ SOLN: 25.0000 ug | INTRAMUSCULAR | Status: DC | PRN

## 2018-05-10 MED ORDER — ONDANSETRON HCL 4 MG/2ML IJ SOLN: 4.0000 mg | Freq: Once | INTRAMUSCULAR | Status: DC | PRN

## 2018-05-10 MED ORDER — POLYETHYLENE GLYCOL 3350 17 G PO PACK: 17.0000 g | PACK | Freq: Every day | ORAL | Status: DC | PRN

## 2018-05-10 MED ORDER — METHOCARBAMOL 500 MG PO TABS
ORAL_TABLET | ORAL | Status: AC
  Filled 2018-05-10: qty 1

## 2018-05-10 MED ORDER — METOCLOPRAMIDE HCL 5 MG PO TABS: 5.0000 mg | ORAL_TABLET | Freq: Three times a day (TID) | ORAL | Status: DC | PRN

## 2018-05-10 MED ORDER — MAGNESIUM CITRATE PO SOLN: 1.0000 | Freq: Once | ORAL | Status: DC | PRN

## 2018-05-10 SURGICAL SUPPLY — 27 items
BLADE SURG 21 STRL SS (BLADE) ×3 IMPLANT
COVER SURGICAL LIGHT HANDLE (MISCELLANEOUS) ×3 IMPLANT
DRAPE EXTREMITY T 121X128X90 (DRAPE) ×3 IMPLANT
DRAPE INCISE IOBAN 66X45 STRL (DRAPES) ×3 IMPLANT
DRAPE U-SHAPE 47X51 STRL (DRAPES) ×6 IMPLANT
DRESSING PREVENA PLUS CUSTOM (GAUZE/BANDAGES/DRESSINGS) IMPLANT
DRSG PREVENA PLUS CUSTOM (GAUZE/BANDAGES/DRESSINGS) ×3
DURAPREP 26ML APPLICATOR (WOUND CARE) ×3 IMPLANT
ELECT REM PT RETURN 9FT ADLT (ELECTROSURGICAL) ×3
ELECTRODE REM PT RTRN 9FT ADLT (ELECTROSURGICAL) ×1 IMPLANT
GLOVE BIOGEL PI IND STRL 9 (GLOVE) ×1 IMPLANT
GLOVE BIOGEL PI INDICATOR 9 (GLOVE) ×2
GLOVE SURG ORTHO 9.0 STRL STRW (GLOVE) ×3 IMPLANT
GOWN STRL REUS W/ TWL XL LVL3 (GOWN DISPOSABLE) ×2 IMPLANT
GOWN STRL REUS W/TWL XL LVL3 (GOWN DISPOSABLE) ×6
KIT BASIN OR (CUSTOM PROCEDURE TRAY) ×3 IMPLANT
KIT TURNOVER KIT B (KITS) ×3 IMPLANT
MANIFOLD NEPTUNE II (INSTRUMENTS) ×3 IMPLANT
NS IRRIG 1000ML POUR BTL (IV SOLUTION) ×3 IMPLANT
PACK GENERAL/GYN (CUSTOM PROCEDURE TRAY) ×3 IMPLANT
PAD ARMBOARD 7.5X6 YLW CONV (MISCELLANEOUS) ×3 IMPLANT
PAD NEG PRESSURE SENSATRAC (MISCELLANEOUS) IMPLANT
STAPLER VISISTAT 35W (STAPLE) IMPLANT
SUT ETHILON 2 0 PSLX (SUTURE) ×6 IMPLANT
SUT SILK 2 0 (SUTURE)
SUT SILK 2-0 18XBRD TIE 12 (SUTURE) IMPLANT
TOWEL OR 17X26 10 PK STRL BLUE (TOWEL DISPOSABLE) ×3 IMPLANT

## 2018-05-10 NOTE — Progress Notes (Signed)
Error

## 2018-05-10 NOTE — Progress Notes (Signed)
Pt refusing ice therapy at this time  Will continue to monitor

## 2018-05-10 NOTE — Progress Notes (Signed)
ANTICOAGULATION CONSULT NOTE - Follow Up Consult  Pharmacy Consult for Heparin Indication: atrial fibrillation  Allergies  Allergen Reactions  . Sulfa Antibiotics Rash   Patient Measurements: Height: 6\' 1"  (185.4 cm) Weight: 201 lb 4.5 oz (91.3 kg) IBW/kg (Calculated) : 79.9 Heparin Dosing Weight:  94.3 kg  Vital Signs: Temp: 98.7 F (37.1 C) (08/14 0522) Temp Source: Oral (08/14 0522) BP: 106/42 (08/14 0522) Pulse Rate: 64 (08/14 0522)  Labs: Recent Labs    05/08/18 0355  05/09/18 0534 05/09/18 1453 05/09/18 2208 05/10/18 0357  HGB 8.8*  --  8.8*  --   --  9.1*  HCT 27.4*  --  27.9*  --   --  28.9*  PLT 302  --  298  --   --  343  APTT 39*   < > 56* 54* 85*  --   HEPARINUNFRC 0.38   < > 0.38 0.28* 0.54 0.44  CREATININE 1.29*  --  1.32*  --   --  1.34*   < > = values in this interval not displayed.   Estimated Creatinine Clearance: 54.7 mL/min (A) (by C-G formula based on SCr of 1.34 mg/dL (H)).  Assessment:  77yo male admitted with shortness of breath requiring BiPAP upon admission.  Anticoag: heparin for Afib. On eliquis last dose 8/9 2000. Switching to heparin d/t bleed from foot stump on Eliquis.  -Heparin restarted 8/10 and stopped at 1800 d/t bleeding from wound  Iv heparin was restarted on 8/11.   8/13 aPTT 85 and heparin level 0.54 - both therapeutic and are now correlating. Will now use heparin level only to dose.   8/14 HL therapeutic (0.44) on current rate of 2500 units/hr.  CBC stable and no noted bleeding  Goal of Therapy:  Heparin level 0.3-0.7 units/mL Monitor platelets by anticoagulation protocol: Yes   Plan:  Cont heparin at 2550 units/hr Monitor daily HL/CBC while on heparin  Rober Minion, PharmD., MS Clinical Pharmacist Pager:  [phone] Thank you for allowing pharmacy to be part of this patients care team.

## 2018-05-10 NOTE — Transfer of Care (Signed)
Immediate Anesthesia Transfer of Care Note  Patient: Kevin [name]  Procedure(s) Performed: REVISION LEFT TRANSMETATARSAL AMPUTATION (Left )  Patient Location: PACU  Anesthesia Type:General  Level of Consciousness: awake, alert  and oriented  Airway & Oxygen Therapy: Patient Spontanous Breathing and Patient connected to nasal cannula oxygen  Post-op Assessment: Report given to RN, Post -op Vital signs reviewed and stable and Patient moving all extremities X 4  Post vital signs: Reviewed and stable  Last Vitals:  Vitals Value Taken Time  BP 103/55 05/10/2018 11:55 AM  Temp    Pulse 65 05/10/2018 11:58 AM  Resp 18 05/10/2018 11:58 AM  SpO2 93 % 05/10/2018 11:58 AM  Vitals shown include unvalidated device data.  Last Pain:  Vitals:   05/10/18 0927  TempSrc: Oral  PainSc:          Complications: No apparent anesthesia complications

## 2018-05-10 NOTE — Progress Notes (Signed)
Paged Kevin Moron Np to notify. Patient's potassium 3.3. No new orders received. Report given to oncoming RN.

## 2018-05-10 NOTE — Progress Notes (Signed)
Cardiology PA agreed to resume heparin drip

## 2018-05-10 NOTE — H&P (Signed)
Kevin [name] is an 77 y.o. male.   Chief Complaint: Dehiscence left transmetatarsal amputation. HPI: Patient is a 77 year old gentleman with diabetic insensate neuropathy peripheral vascular disease who has had dehiscence of the transmetatarsal amputation he has failed conservative wound care.  Past Medical History:  Diagnosis Date  . A-fib ([place]) 03/20/2018  . Acute blood loss anemia   . Benign essential HTN   . Benign prostatic hyperplasia with urinary retention   . Cerebrovascular accident (CVA) ([place])   . CKD (chronic kidney disease), stage III ([place])   . Diabetes ([place]) 03/20/2018  . Diabetes mellitus type 2 in nonobese ([facility])   . Essential hypertension 03/20/2018  . Former tobacco use   . H/O ulcerative colitis 1993  . Hypertension   . Hypokalemia   . Leukocytosis   . New onset atrial fibrillation ([place])   . Osteomyelitis ([place])    a. 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  [name] (paroxysmal atrial fibrillation) ([place])    a. dx 03/2018 in setting of stroke, osteomyelitis.  . Sepsis ([place]) 03/20/2018  . Stage 3 chronic kidney disease ([place])    pt/family unaware  . Status post transmetatarsal amputation of left foot ([place])   . Stroke ([place]) 03/2018  . Stroke-like episode ([place]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 03/16/2018  . Tachycardia 03/20/2018  . Wrist fracture    Left    Past Surgical History:  Procedure Laterality Date  . AMPUTATION Left 03/24/2018   Procedure: Transmetatarsal Amputation Left Foot;  Surgeon: [name], MD;  Location: [facility];  Service: Orthopedics;  Laterality: Left;  . CARDIOVERSION N/A 05/05/2018   Procedure: CARDIOVERSION;  Surgeon: [name], MD;  Location: [facility] ENDOSCOPY;  Service: Cardiovascular;  Laterality: N/A;  . COLONOSCOPY  12/2017   benign, remission from ulcerative colitis in 90s  . I&D EXTREMITY Left 03/17/2018   Procedure: IRRIGATION AND DEBRIDEMENT FOOT;  Surgeon: [name], MD;  Location: [facility];  Service: Orthopedics;  Laterality: Left;     Family History  Problem Relation Age of Onset  . Hypertension Father    Social History:  reports that he quit smoking about 34 years ago. His smoking use included cigarettes. He started smoking about 56 years ago. He smoked 1.00 pack per day. He has never used smokeless tobacco. He reports that he drinks about 4.0 standard drinks of alcohol per week. He reports that he does not use drugs.  Allergies:  Allergies  Allergen Reactions  . Sulfa Antibiotics Rash    Medications Prior to Admission  Medication Sig Dispense Refill  . apixaban (ELIQUIS) 5 MG TABS tablet Take 1 tablet (5 mg total) by mouth 2 (two) times daily. 180 tablet 3  . diltiazem (CARDIZEM CD) 240 MG 24 hr capsule Take 1 capsule (240 mg total) by mouth daily. 30 capsule 11  . FLUoxetine (PROZAC) 20 MG capsule Take 20 mg by mouth daily.    . fluticasone (FLONASE) 50 MCG/ACT nasal spray Place 2 sprays into both nostrils at bedtime.    . furosemide (LASIX) 20 MG tablet Take 1 tablet (20 mg total) by mouth daily. 90 tablet 3  . glimepiride (AMARYL) 4 MG tablet Take 4 mg by mouth daily with breakfast.    . mesalamine (APRISO) 0.375 g 24 hr capsule Take 750 mg by mouth 2 (two) times daily.     . metFORMIN (GLUCOPHAGE) 500 MG tablet Take 1,000 mg by mouth 2 (two) times daily with a meal.     . metoprolol succinate (TOPROL-XL) 100 MG  24 hr tablet Take 2 tablets (200 mg total) by mouth daily. Take with or immediately following a meal. 180 tablet 1  . Multiple Vitamin (MULTIVITAMIN WITH MINERALS) TABS tablet Take 1 tablet by mouth daily.    . mupirocin ointment (BACTROBAN) 2 % Apply 1 application topically daily.    . rosuvastatin (CRESTOR) 10 MG tablet Take 0.5 tablets (5 mg total) by mouth daily.    . sitaGLIPtin (JANUVIA) 100 MG tablet Take 100 mg by mouth daily.    . tamsulosin (FLOMAX) 0.4 MG CAPS capsule Take 1 capsule (0.4 mg total) by mouth 2 times daily at 12 noon and 4 pm. 60 capsule 0    Results for orders placed or  performed during the hospital encounter of 05/06/18 (from the past 48 hour(s))  Glucose, capillary     Status: Abnormal   Collection Time: 05/08/18 11:30 AM  Result Value Ref Range   Glucose-Capillary 226 (H) 70 - 99 mg/dL  APTT     Status: Abnormal   Collection Time: 05/08/18 12:10 PM  Result Value Ref Range   aPTT 45 (H) 24 - 36 seconds    Comment:        IF BASELINE aPTT IS ELEVATED, SUGGEST PATIENT RISK ASSESSMENT BE USED TO DETERMINE APPROPRIATE ANTICOAGULANT THERAPY. Performed at [facility], [place] [address], [place], [place] [address]   Glucose, capillary     Status: Abnormal   Collection Time: 05/08/18  4:45 PM  Result Value Ref Range   Glucose-Capillary 200 (H) 70 - 99 mg/dL  Heparin level (unfractionated)     Status: None   Collection Time: 05/08/18  8:18 PM  Result Value Ref Range   Heparin Unfractionated 0.35 0.30 - 0.70 IU/mL    Comment: (NOTE) If heparin results are below expected values, and patient dosage has  been confirmed, suggest follow up testing of antithrombin III levels. Performed at [facility], [place] [address] [place], [place] [address]   APTT     Status: Abnormal   Collection Time: 05/08/18  8:18 PM  Result Value Ref Range   aPTT 54 (H) 24 - 36 seconds    Comment:        IF BASELINE aPTT IS ELEVATED, SUGGEST PATIENT RISK ASSESSMENT BE USED TO DETERMINE APPROPRIATE ANTICOAGULANT THERAPY. Performed at [facility], [place] [address], [place], [place] [address]   Glucose, capillary     Status: Abnormal   Collection Time: 05/08/18  9:17 PM  Result Value Ref Range   Glucose-Capillary 176 (H) 70 - 99 mg/dL   Comment 1 Notify RN    Comment 2 Document in Chart   Heparin level (unfractionated)     Status: None   Collection Time: 05/09/18  5:34 AM  Result Value Ref Range   Heparin Unfractionated 0.38 0.30 - 0.70 IU/mL    Comment: (NOTE) If heparin results are below expected values, and patient dosage has  been confirmed, suggest  follow up testing of antithrombin III levels. Performed at [facility], [name] [address] [place], [place] [address]   CBC     Status: Abnormal   Collection Time: 05/09/18  5:34 AM  Result Value Ref Range   WBC 12.5 (H) 4.0 - 10.5 K/uL   RBC 3.25 (L) 4.22 - 5.81 MIL/uL   Hemoglobin 8.8 (L) 13.0 - 17.0 g/dL   HCT 27.9 (L) 39.0 - 52.0 %   MCV 85.8 78.0 - 100.0 fL   MCH 27.1 26.0 -  34.0 pg   MCHC 31.5 30.0 - 36.0 g/dL   RDW 14.2 11.5 - 15.5 %   Platelets 298 150 - 400 K/uL    Comment: Performed at [facility], [place] [address] [place], [place] [address]  APTT     Status: Abnormal   Collection Time: 05/09/18  5:34 AM  Result Value Ref Range   aPTT 56 (H) 24 - 36 seconds    Comment:        IF BASELINE aPTT IS ELEVATED, SUGGEST PATIENT RISK ASSESSMENT BE USED TO DETERMINE APPROPRIATE ANTICOAGULANT THERAPY. Performed at [facility], [place] [address], [place], [place] [address]   Basic metabolic panel     Status: Abnormal   Collection Time: 05/09/18  5:34 AM  Result Value Ref Range   Sodium 134 (L) 135 - 145 mmol/L   Potassium 3.8 3.5 - 5.1 mmol/L    Comment: DELTA CHECK NOTED   Chloride 94 (L) 98 - 111 mmol/L   CO2 29 22 - 32 mmol/L   Glucose, Bld 188 (H) 70 - 99 mg/dL   BUN 18 8 - 23 mg/dL   Creatinine, Ser 1.32 (H) 0.61 - 1.24 mg/dL   Calcium 8.7 (L) 8.9 - 10.3 mg/dL   GFR calc non Af Amer 51 (L) >60 mL/min   GFR calc Af Amer 60 (L) >60 mL/min    Comment: (NOTE) The eGFR has been calculated using the CKD EPI equation. This calculation has not been validated in all clinical situations. eGFR's persistently <60 mL/min signify possible Chronic Kidney Disease.    Anion gap 11 5 - 15    Comment: Performed at [facility] [address] [place], [place] [address]  Glucose, capillary     Status: Abnormal   Collection Time: 05/09/18  8:02 AM  Result Value Ref Range   Glucose-Capillary 166 (H) 70 - 99 mg/dL  Glucose, capillary     Status: Abnormal    Collection Time: 05/09/18 11:38 AM  Result Value Ref Range   Glucose-Capillary 212 (H) 70 - 99 mg/dL  Heparin level (unfractionated)     Status: Abnormal   Collection Time: 05/09/18  2:53 PM  Result Value Ref Range   Heparin Unfractionated 0.28 (L) 0.30 - 0.70 IU/mL    Comment: (NOTE) If heparin results are below expected values, and patient dosage has  been confirmed, suggest follow up testing of antithrombin III levels. Performed at [facility], [place] [address], [place], [place] [address]   APTT     Status: Abnormal   Collection Time: 05/09/18  2:53 PM  Result Value Ref Range   aPTT 54 (H) 24 - 36 seconds    Comment:        IF BASELINE aPTT IS ELEVATED, SUGGEST PATIENT RISK ASSESSMENT BE USED TO DETERMINE APPROPRIATE ANTICOAGULANT THERAPY. Performed at [facility], [place] [address], [place], [place] [address]   Glucose, capillary     Status: Abnormal   Collection Time: 05/09/18  4:24 PM  Result Value Ref Range   Glucose-Capillary 171 (H) 70 - 99 mg/dL  Glucose, capillary     Status: Abnormal   Collection Time: 05/09/18  9:07 PM  Result Value Ref Range   Glucose-Capillary 180 (H) 70 - 99 mg/dL  APTT     Status: Abnormal   Collection Time: 05/09/18 10:08 PM  Result Value Ref Range   aPTT 85 (H) 24 - 36 seconds    Comment:  IF BASELINE aPTT IS ELEVATED, SUGGEST PATIENT RISK ASSESSMENT BE USED TO DETERMINE APPROPRIATE ANTICOAGULANT THERAPY. Performed at [facility], [place] [address] [place], [place] [address]   Heparin level (unfractionated)     Status: None   Collection Time: 05/09/18 10:08 PM  Result Value Ref Range   Heparin Unfractionated 0.54 0.30 - 0.70 IU/mL    Comment: (NOTE) If heparin results are below expected values, and patient dosage has  been confirmed, suggest follow up testing of antithrombin III levels. Performed at [facility], [place] [address], [place], [place] [address]   Surgical pcr screen     Status: None    Collection Time: 05/10/18  2:02 AM  Result Value Ref Range   MRSA, PCR NEGATIVE NEGATIVE   Staphylococcus aureus NEGATIVE NEGATIVE    Comment: (NOTE) The Xpert SA Assay (FDA approved for NASAL specimens in patients 70 years of age and older), is one component of a comprehensive surveillance program. It is not intended to diagnose infection nor to guide or monitor treatment. Performed at [facility], [place] [address] [place], [place] [address]   Heparin level (unfractionated)     Status: None   Collection Time: 05/10/18  3:57 AM  Result Value Ref Range   Heparin Unfractionated 0.44 0.30 - 0.70 IU/mL    Comment: (NOTE) If heparin results are below expected values, and patient dosage has  been confirmed, suggest follow up testing of antithrombin III levels. Performed at [facility], [place] [address], [place], [place] [address]   CBC     Status: Abnormal   Collection Time: 05/10/18  3:57 AM  Result Value Ref Range   WBC 10.8 (H) 4.0 - 10.5 K/uL   RBC 3.40 (L) 4.22 - 5.81 MIL/uL   Hemoglobin 9.1 (L) 13.0 - 17.0 g/dL   HCT 28.9 (L) 39.0 - 52.0 %   MCV 85.0 78.0 - 100.0 fL   MCH 26.8 26.0 - 34.0 pg   MCHC 31.5 30.0 - 36.0 g/dL   RDW 14.1 11.5 - 15.5 %   Platelets 343 150 - 400 K/uL    Comment: Performed at [facility], [place] [address], [place], [place] [address]  Basic metabolic panel     Status: Abnormal   Collection Time: 05/10/18  3:57 AM  Result Value Ref Range   Sodium 133 (L) 135 - 145 mmol/L   Potassium 3.3 (L) 3.5 - 5.1 mmol/L   Chloride 93 (L) 98 - 111 mmol/L   CO2 28 22 - 32 mmol/L   Glucose, Bld 176 (H) 70 - 99 mg/dL   BUN 24 (H) 8 - 23 mg/dL   Creatinine, Ser 1.34 (H) 0.61 - 1.24 mg/dL   Calcium 8.9 8.9 - 10.3 mg/dL   GFR calc non Af Amer 51 (L) >60 mL/min   GFR calc Af Amer 59 (L) >60 mL/min    Comment: (NOTE) The eGFR has been calculated using the CKD EPI equation. This calculation has not been validated in all clinical situations. eGFR's  persistently <60 mL/min signify possible Chronic Kidney Disease.    Anion gap 12 5 - 15    Comment: Performed at [facility] [address], [place], [place] [address]  Glucose, capillary     Status: Abnormal   Collection Time: 05/10/18  7:37 AM  Result Value Ref Range   Glucose-Capillary 168 (H) 70 - 99 mg/dL   No results found.  Review of Systems  All other systems reviewed and are negative.   Blood pressure (!) 106/42, pulse 64, temperature 98.7 F (37.1 C), temperature source Oral, resp. rate 18, height '6\' 1"'  (1.854 m), weight 91.3 kg, SpO2 93 %. Physical Exam  Patient is alert oriented no adenopathy well-dressed normal affect normal respiratory effort he has an antalgic gait.  Examination patient has exposed bone and tendon with wound dehiscence of the transmetatarsal amputation there is no ascending cellulitis there is no purulent drainage. Assessment/Plan Assessment: Dehiscence left transmetatarsal amputation.  Plan: We will plan for revision of the left transmetatarsal amputation.  Risk and benefits were discussed including risk of the wound not healing.  Patient states he understands wished to proceed at this time.  [name], MD 05/10/2018, 9:19 AM

## 2018-05-10 NOTE — Progress Notes (Signed)
[place], spoke to on call PA, she stated to resume heparin drip   Pharmacy aware    Called cardiology to inform

## 2018-05-10 NOTE — Progress Notes (Signed)
Progress Note  Patient Name: Kevin [name] Date of Encounter: 05/10/2018  Primary Cardiologist: Lauree Chandler, MD   Subjective   Reports improvement in his breathing this morning. Anticipating surgery on his left foot today. Denies chest pain.   Inpatient Medications    Scheduled Meds: . diltiazem  240 mg Oral Daily  . FLUoxetine  20 mg Oral Daily  . fluticasone  2 spray Each Nare QHS  . furosemide  60 mg Intravenous BID  . insulin aspart  0-9 Units Subcutaneous TID WC  . linagliptin  5 mg Oral Daily  . mesalamine  0.75 g Oral BID  . metoprolol succinate  200 mg Oral Daily  . multivitamin with minerals  1 tablet Oral Daily  . mupirocin cream   Topical Daily  . rosuvastatin  5 mg Oral Daily  . tamsulosin  0.4 mg Oral BID   Continuous Infusions: .  ceFAZolin (ANCEF) IV    . heparin 2,550 Units/hr (05/10/18 0329)   PRN Meds: acetaminophen **OR** acetaminophen, ondansetron **OR** ondansetron (ZOFRAN) IV   Vital Signs    Vitals:   05/09/18 1139 05/09/18 1919 05/10/18 0522 05/10/18 0524  BP: 124/65 123/73 (!) 106/42   Pulse: 64 66 64   Resp:  18 18   Temp: 98.6 F (37 C) 98.7 F (37.1 C) 98.7 F (37.1 C)   TempSrc: Oral Oral Oral   SpO2: 92% 91% 93%   Weight:    91.3 kg  Height:        Intake/Output Summary (Last 24 hours) at 05/10/2018 0751 Last data filed at 05/10/2018 0524 Gross per 24 hour  Intake 729.09 ml  Output 3025 ml  Net -2295.91 ml   Filed Weights   05/08/18 0434 05/09/18 0659 05/10/18 0524  Weight: 91.8 kg 90 kg 91.3 kg    Telemetry    NSR - Personally Reviewed  Physical Exam   GEN: Laying flat in bed, breathing comfortably in no acute distress.   Neck: No JVD, no carotid bruits Cardiac: RRR, no murmurs, rubs, or gallops.  Respiratory: Clear to auscultation bilaterally with mild crackles at left lung base. No wheezing/rhonchi GI: NABS, Soft, nontender, non-distended  MS: No edema; No deformity. Neuro:  Nonfocal, moving  all extremities spontaneously Psych: Normal affect   Labs    Chemistry Recent Labs  Lab 05/06/18 0328 05/06/18 0618 05/08/18 0355 05/09/18 0534 05/10/18 0357  NA 134* 137 133* 134* 133*  K 3.9 4.0 3.1* 3.8 3.3*  CL 100 101 94* 94* 93*  CO2 23 26 29 29 28   GLUCOSE 234* 198* 176* 188* 176*  BUN 20 19 17 18  24*  CREATININE 1.47* 1.53* 1.29* 1.32* 1.34*  CALCIUM 8.9 8.9 8.5* 8.7* 8.9  PROT 7.0 7.2  --   --   --   ALBUMIN 3.2* 3.2*  --   --   --   AST 22 18  --   --   --   ALT 22 23  --   --   --   ALKPHOS 111 95  --   --   --   BILITOT 1.0 0.8  --   --   --   GFRNONAA 45* 43* 53* 51* 51*  GFRAA 52* 50* >60 60* 59*  ANIONGAP 11 10 10 11 12      Hematology Recent Labs  Lab 05/08/18 0355 05/09/18 0534 05/10/18 0357  WBC 13.2* 12.5* 10.8*  RBC 3.19* 3.25* 3.40*  HGB 8.8* 8.8* 9.1*  HCT 27.4* 27.9*  28.9*  MCV 85.9 85.8 85.0  MCH 27.6 27.1 26.8  MCHC 32.1 31.5 31.5  RDW 14.3 14.2 14.1  PLT 302 298 343    Cardiac Enzymes Recent Labs  Lab 05/06/18 0618 05/06/18 1134 05/06/18 1750  TROPONINI <0.03 <0.03 <0.03    Recent Labs  Lab 05/06/18 0354  TROPIPOC 0.02     BNP Recent Labs  Lab 05/06/18 0328  BNP 379.3*     DDimer No results for input(s): DDIMER in the last 168 hours.   Radiology    No results found.  Cardiac Studies   Echo 03/17/2018 LV EF: 60% - 65%  Study Conclusions  - Left ventricle: The cavity size was normal. Systolic function wasnormal. The estimated ejection fraction was in the range of 60%to 65%. Wall motion was normal; there were no regional wallmotion abnormalities. There was no evidence of elevatedventricular filling pressure by Doppler parameters. - Left atrium: The atrium was moderately dilated.  Impressions:  - No cardiac source of emboli was indentified.  Patient Profile     77 y.o.malewith PMH of HTN, DM II, former tobacco abuse, CVA, osteomyelitis and persistent afib,recently possiblestroke and  osteomyelitis requiring L transmetatarsal amputation in 7/19 presented with acute on chronic diastolic HF.   Assessment & Plan    1. Acute on chronic diastolic CHF: lasix was increased to 60mg  IV BID yesterday with improved UOP, net -2.3L in the past 24 hours and -7.0L this admission. Weight 95.6kg on admission>91.3kg today (up a bit from 90kg yesterday). Cr is stable at 1.34 today. K3.3 and potassium repleted this morning. - Continue IV diuresis today - anticipate transition to po tomorrow  - Monitor volume status closely in the perioperative setting - Continue metoprolol  2. Paroxysmal atrial fibrillation: recently diagnosed and is s/p DCCV 05/05/18; also with recent stroke felt to be 2/2 embolic event. He is maintaining sinus rhythm this admission. He is on a heparin gtt pending procedure today. - Resume anticoagulation when cleared by surgery - anticipate discharge home on eliquis.  - Continue metoprolol and diltiazem  3. 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      Signed, Abigail Butts, PA-C  05/10/2018, 7:51 AM   [phone]

## 2018-05-10 NOTE — Op Note (Signed)
05/10/2018  11:55 AM  PATIENT:  Kevin [name]    PRE-OPERATIVE DIAGNOSIS:  Dehiscence Left Transmetatarsal Amputation  POST-OPERATIVE DIAGNOSIS:  Same  PROCEDURE:  REVISION LEFT TRANSMETATARSAL AMPUTATION  SURGEON:  [name], MD  PHYSICIAN ASSISTANT:None ANESTHESIA:   General  PREOPERATIVE INDICATIONS:  [name] is a  77 y.o. male with a diagnosis of Dehiscence Left Transmetatarsal Amputation who failed conservative measures and elected for surgical management.    The risks benefits and alternatives were discussed with the patient preoperatively including but not limited to the risks of infection, bleeding, nerve injury, cardiopulmonary complications, the need for revision surgery, among others, and the patient was willing to proceed.  OPERATIVE IMPLANTS: Praveena plus wound VAC  @ENCIMAGES @  OPERATIVE FINDINGS: Ischemic changes distally.  OPERATIVE PROCEDURE: Patient was brought the operating room and underwent a general anesthetic.  After adequate levels of anesthesia were obtained patient's left lower extremity was prepped using DuraPrep draped in the sterile field a timeout was called.  A fishmouth incision was made just proximal to the wound dehiscence.  Patient underwent revision of the transmetatarsal amputation with a more proximal amputation.  Electrocautery was used for hemostasis the wound was irrigated with normal saline.  The incision was closed using 2-0 nylon.  A Praveena wound VAC was applied this had a good suction fit patient was extubated taken to PACU in stable condition.   DISCHARGE PLANNING:  Antibiotic duration: 24 hours postoperatively  Weightbearing: Nonweightbearing on the left  Pain medication: Opioid pathway ordered  Dressing care/ Wound VAC: Continue wound VAC for 1 week.  Ambulatory devices: Walker or wheelchair  Discharge to: Home versus skilled nursing depending on patient's strength and ambulatory status.  Follow-up: In  the office 1 week post operative.

## 2018-05-10 NOTE — Progress Notes (Signed)
Paged PA for ortho regarding heparin drip  Awaiting call back

## 2018-05-10 NOTE — Progress Notes (Signed)
Pt transported off unit  Pt removed glasses, voided prior to transport and wife directed to waiting room

## 2018-05-10 NOTE — Anesthesia Preprocedure Evaluation (Signed)
Anesthesia Evaluation  Patient identified by MRN, date of birth, ID band Patient awake    Reviewed: Allergy & Precautions, NPO status , Patient's Chart, lab work & pertinent test results  Airway Mallampati: II  TM Distance: >3 FB Neck ROM: Full    Dental  (+) Dental Advisory Given   Pulmonary former smoker,    breath sounds clear to auscultation       Cardiovascular hypertension, Pt. on medications and Pt. on home beta blockers + Peripheral Vascular Disease and +CHF   Rhythm:Regular Rate:Normal  02/2018: Study Conclusions  - Left ventricle: The cavity size was normal. Systolic function was  normal. The estimated ejection fraction was in the range of 60%  to 65%. Wall motion was normal; there were no regional wall  motion abnormalities. There was no evidence of elevated  ventricular filling pressure by Doppler parameters. - Left atrium: The atrium was moderately dilated.   Neuro/Psych  Neuromuscular disease CVA    GI/Hepatic negative GI ROS, Neg liver ROS,   Endo/Other  diabetes  Renal/GU CRFRenal disease     Musculoskeletal   Abdominal   Peds  Hematology  (+) anemia ,   Anesthesia Other Findings   Reproductive/Obstetrics                             Lab Results  Component Value Date   WBC 10.8 (H) 05/10/2018   HGB 9.1 (L) 05/10/2018   HCT 28.9 (L) 05/10/2018   MCV 85.0 05/10/2018   PLT 343 05/10/2018   Lab Results  Component Value Date   CREATININE 1.34 (H) 05/10/2018   BUN 24 (H) 05/10/2018   NA 133 (L) 05/10/2018   K 3.3 (L) 05/10/2018   CL 93 (L) 05/10/2018   CO2 28 05/10/2018    Anesthesia Physical Anesthesia Plan  ASA: III  Anesthesia Plan: General   Post-op Pain Management:    Induction: Intravenous  PONV Risk Score and Plan: 2 and Dexamethasone, Ondansetron and Treatment may vary due to age or medical condition  Airway Management Planned: LMA  Additional  Equipment:   Intra-op Plan:   Post-operative Plan: Extubation in OR  Informed Consent: I have reviewed the patients History and Physical, chart, labs and discussed the procedure including the risks, benefits and alternatives for the proposed anesthesia with the patient or authorized representative who has indicated his/her understanding and acceptance.   Dental advisory given  Plan Discussed with: CRNA  Anesthesia Plan Comments:         Anesthesia Quick Evaluation

## 2018-05-10 NOTE — Progress Notes (Signed)
Pharmacy stated to resume heparin drip at 25.5 and monitor signs of bleeding   Educated pt and pt family, pt aware

## 2018-05-10 NOTE — Progress Notes (Signed)
Patient ID: Kevin [name], male   DOB: May 08, 1941, 77 y.o.   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 ED evaluation noted elevated BNP of 379 with CHF and x-ray and hypoxia and required IV diuretics with BiPAP treatment in the ED with improvement and admitted for acute on chronic CHF.   Assessment & Plan:   Acute on chronic diastolic CHF: - EF in June 2019 was 60 to 65%.  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 Given last week prior to cardioversion and had dressing change and patient states that clot was removed. - Plan for surgery today, left transmetatarsal amputation    DVT prophylaxis: Heparin drip Code Status: full code  Family Communication: no family at bedside thisa m Disposition Plan: surgery today    Consultants:   Cardiology   Procedures:   None  Antimicrobials:   Pre-op cefazolin    Subjective: No overnight events.  Objective: Vitals:   05/10/18 0522 05/10/18 0524 05/10/18 0927 05/10/18 1002  BP: (!) 106/42  (!)  106/53   Pulse: 64  63   Resp: 18  18   Temp: 98.7 F (37.1 C)  98.3 F (36.8 C)   TempSrc: Oral  Oral   SpO2: 93%  92%   Weight:  91.3 kg  91.3 kg  Height:    6\' 1"  (1.854 m)    Intake/Output Summary (Last 24 hours) at 05/10/2018 1010 Last data filed at 05/10/2018 0957 Gross per 24 hour  Intake 772 ml  Output 2700 ml  Net -1928 ml   Filed Weights   05/09/18 0659 05/10/18 0524 05/10/18 1002  Weight: 90 kg 91.3 kg 91.3 kg    Examination:  Constitutional: Appears well-developed and well-nourished. No distress.  CVS: Rate controlled, S1/S2 + Pulmonary: No wheezing, no rhonchi  Abdominal: Soft. BS +,  no distension, tenderness, rebound or guarding.  Musculoskeletal: Normal range of motion. No edema and no tenderness.  Lymphadenopathy: No lymphadenopathy noted, cervical, inguinal. Neuro: Alert. Normal reflexes, muscle tone coordination. No cranial nerve deficit. Skin: Skin is warm and dry. Left foot dressing in place  Psychiatric: Normal mood and affect. Behavior, judgment, thought content normal.     Data Reviewed: I have personally reviewed following labs and imaging studies  CBC: Recent Labs  Lab 05/06/18 0328 05/06/18 0618 05/07/18 0516 05/08/18 0355 05/09/18 0534 05/10/18 0357  WBC 20.2* 15.4* 13.3* 13.2* 12.5* 10.8*  NEUTROABS 16.7* 12.6*  --   --   --   --   HGB 9.8* 9.3* 9.3* 8.8* 8.8* 9.1*  HCT 31.6* 30.7*  29.9* 27.4* 27.9* 28.9*  MCV 88.8 88.5 88.2 85.9 85.8 85.0  PLT 408* 372 323 302 298 580   Basic Metabolic Panel: Recent Labs  Lab 05/06/18 0328 05/06/18 0618 05/08/18 0355 05/09/18 0534 05/10/18 0357  NA 134* 137 133* 134* 133*  K 3.9 4.0 3.1* 3.8 3.3*  CL 100 101 94* 94* 93*  CO2 23 26 29 29 28   GLUCOSE 234* 198* 176* 188* 176*  BUN 20 19 17 18  24*  CREATININE 1.47* 1.53* 1.29* 1.32* 1.34*  CALCIUM 8.9 8.9 8.5* 8.7* 8.9  MG  --  1.5* 1.7  --   --    GFR: Estimated Creatinine Clearance: 54.7 mL/min (A) (by C-G formula based on SCr of  1.34 mg/dL (H)). Liver Function Tests: Recent Labs  Lab 05/06/18 0328 05/06/18 0618  AST 22 18  ALT 22 23  ALKPHOS 111 95  BILITOT 1.0 0.8  PROT 7.0 7.2  ALBUMIN 3.2* 3.2*   No results for input(s): LIPASE, AMYLASE in the last 168 hours. No results for input(s): AMMONIA in the last 168 hours. Coagulation Profile: No results for input(s): INR, PROTIME in the last 168 hours. Cardiac Enzymes: Recent Labs  Lab 05/06/18 0618 05/06/18 1134 05/06/18 1750  TROPONINI <0.03 <0.03 <0.03   BNP (last 3 results) Recent Labs    04/27/18 1236  PROBNP 3,814*   HbA1C: No results for input(s): HGBA1C in the last 72 hours. CBG: Recent Labs  Lab 05/09/18 0802 05/09/18 1138 05/09/18 1624 05/09/18 2107 05/10/18 0737  GLUCAP 166* 212* 171* 180* 168*   Lipid Profile: No results for input(s): CHOL, HDL, LDLCALC, TRIG, CHOLHDL, LDLDIRECT in the last 72 hours. Thyroid Function Tests: No results for input(s): TSH, T4TOTAL, FREET4, T3FREE, THYROIDAB in the last 72 hours. Anemia Panel: No results for input(s): VITAMINB12, FOLATE, FERRITIN, TIBC, IRON, RETICCTPCT in the last 72 hours. Urine analysis:  Sepsis Labs: @LABRCNTIP (procalcitonin:4,lacticidven:4)   ) Recent Results (from the past 240 hour(s))  Surgical pcr screen     Status: None   Collection Time: 05/10/18  2:02 AM  Result Value Ref Range Status   MRSA, PCR NEGATIVE NEGATIVE Final   Staphylococcus aureus NEGATIVE NEGATIVE Final    Comment: (NOTE) The Xpert SA Assay (FDA approved for NASAL specimens in patients 67 years of age and older), is one component of a comprehensive surveillance program. It is not intended to diagnose infection nor to guide or monitor treatment. Performed at [facility], [place] [address], [place], [place] [address]       Radiology Studies: Dg Chest Port 1 View Result Date: 05/06/2018 Bilateral midlung and bibasilar airspace opacities raise concern for pulmonary edema, with a small left  pleural effusion, mildly worsened from the prior study. Mild vascular congestion noted.     Scheduled Meds: . diltiazem  240 mg Oral Daily  . FLUoxetine  20 mg Oral Daily  . fluticasone  2 spray Each Nare QHS  . furosemide  40 mg Intravenous BID  . insulin aspart  0-9 Units Subcutaneous TID WC  . linagliptin  5 mg Oral Daily  . mesalamine  0.75 g Oral BID  . metoprolol succinate  200 mg Oral Daily  . multivitamin  1 tablet Oral Daily  . mupirocin cream   Topical Daily  . rosuvastatin  5 mg Oral Daily  . tamsulosin  0.4 mg Oral BID   Continuous Infusions: .  ceFAZolin (ANCEF) IV    . heparin 2,550 Units/hr (05/10/18 0329)  LOS: 3 days    Time spent: 25 minutes Greater than 50% of the time spent on counseling and coordinating the care.   Leisa Lenz, MD [facility] Pager [phone]  If 7PM-7AM, please contact night-coverage [email or web address] Password [email or web address] 05/10/2018, 10:10 AM

## 2018-05-10 NOTE — Progress Notes (Signed)
Called report to short stay, spoke to [facility]

## 2018-05-10 NOTE — Progress Notes (Signed)
MD paged about placing Heparin gtt on hold for surgery. Stated to hold Heparin at this time. Orders received and carried out.

## 2018-05-10 NOTE — Progress Notes (Signed)
PT Cancellation Note  Patient Details Name: Kevin [name] MRN: [id number] DOB: 04-21-1941   Cancelled Treatment:    Reason Eval/Treat Not Completed: Fatigue/lethargy limiting ability to participate .  States he has just come back from surgery and has not slept.  Asked to wait until AM and asked why PT ordered.  Explained he is NWB again and will need to repractice his skills with RW, but he also has WC at home.  Will try again in the AM.  Ramond Dial 05/10/2018, 3:34 PM   Mee Hives, PT MS Acute Rehab Dept. Number: [place]

## 2018-05-10 NOTE — Anesthesia Procedure Notes (Signed)
Procedure Name: LMA Insertion Date/Time: 05/10/2018 11:23 AM Performed by: Mariea Clonts, CRNA Pre-anesthesia Checklist: Patient identified, Emergency Drugs available, Suction available and Patient being monitored Patient Re-evaluated:Patient Re-evaluated prior to induction Oxygen Delivery Method: Circle System Utilized Preoxygenation: Pre-oxygenation with 100% oxygen Induction Type: IV induction Ventilation: Mask ventilation without difficulty LMA: LMA inserted LMA Size: 5.0 Number of attempts: 1 Airway Equipment and Method: Bite block Placement Confirmation: positive ETCO2 Tube secured with: Tape Dental Injury: Teeth and Oropharynx as per pre-operative assessment

## 2018-05-11 ENCOUNTER — Encounter (HOSPITAL_COMMUNITY): Payer: Self-pay | Admitting: Orthopedic Surgery

## 2018-05-11 ENCOUNTER — Telehealth (INDEPENDENT_AMBULATORY_CARE_PROVIDER_SITE_OTHER): Payer: Self-pay

## 2018-05-11 DIAGNOSIS — N289 Disorder of kidney and ureter, unspecified: Secondary | ICD-10-CM

## 2018-05-11 DIAGNOSIS — I10 Essential (primary) hypertension: Secondary | ICD-10-CM

## 2018-05-11 LAB — BASIC METABOLIC PANEL
Anion gap: 9 (ref 5–15)
BUN: 28 mg/dL — ABNORMAL HIGH (ref 8–23)
CO2: 30 mmol/L (ref 22–32)
Calcium: 8.8 mg/dL — ABNORMAL LOW (ref 8.9–10.3)
Chloride: 93 mmol/L — ABNORMAL LOW (ref 98–111)
Creatinine, Ser: 1.45 mg/dL — ABNORMAL HIGH (ref 0.61–1.24)
GFR calc Af Amer: 53 mL/min — ABNORMAL LOW (ref >60)
GFR calc non Af Amer: 46 mL/min — ABNORMAL LOW (ref >60)
Glucose, Bld: 326 mg/dL — ABNORMAL HIGH (ref 70–99)
Potassium: 4.6 mmol/L (ref 3.5–5.1)
Sodium: 132 mmol/L — ABNORMAL LOW (ref 135–145)

## 2018-05-11 LAB — GLUCOSE, CAPILLARY
Glucose-Capillary: 302 mg/dL — ABNORMAL HIGH (ref 70–99)
Glucose-Capillary: 270 mg/dL — ABNORMAL HIGH (ref 70–99)
Glucose-Capillary: 306 mg/dL — ABNORMAL HIGH (ref 70–99)

## 2018-05-11 LAB — CBC
HCT: 27.9 % — ABNORMAL LOW (ref 39.0–52.0)
Hemoglobin: 8.7 g/dL — ABNORMAL LOW (ref 13.0–17.0)
MCH: 26.6 pg (ref 26.0–34.0)
MCHC: 31.2 g/dL (ref 30.0–36.0)
MCV: 85.3 fL (ref 78.0–100.0)
Platelets: 362 10*3/uL (ref 150–400)
RBC: 3.27 MIL/uL — ABNORMAL LOW (ref 4.22–5.81)
RDW: 13.7 % (ref 11.5–15.5)
WBC: 12.4 10*3/uL — ABNORMAL HIGH (ref 4.0–10.5)

## 2018-05-11 LAB — HEPARIN LEVEL (UNFRACTIONATED): Heparin Unfractionated: 0.5 IU/mL (ref 0.30–0.70)

## 2018-05-11 MED ORDER — METOPROLOL SUCCINATE ER 200 MG PO TB24: 200.0000 mg | ORAL_TABLET | Freq: Every day | ORAL | 0 refills | Status: DC

## 2018-05-11 MED ORDER — OXYCODONE HCL 5 MG PO TABS: 5.0000 mg | ORAL_TABLET | Freq: Four times a day (QID) | ORAL | 0 refills | Status: AC | PRN

## 2018-05-11 MED ORDER — FUROSEMIDE 20 MG PO TABS: 40.0000 mg | ORAL_TABLET | Freq: Every day | ORAL | 1 refills | Status: DC

## 2018-05-11 MED ORDER — APIXABAN 5 MG PO TABS
5.0000 mg | ORAL_TABLET | Freq: Two times a day (BID) | ORAL | Status: DC
  Administered 2018-05-11: 5 mg via ORAL
  Filled 2018-05-11: qty 1

## 2018-05-11 NOTE — Progress Notes (Signed)
SATURATION QUALIFICATIONS: (This note is used to comply with regulatory documentation for home oxygen)  Patient Saturations on Room Air at Rest = 87% Patient saturation on 2L at rest = 92%  Patient Saturations on Room Air while Ambulating = N/A  Patient Saturations on 3 Liters of oxygen while Ambulating = 84%  Please briefly explain why patient needs home oxygen: pt with desaturation at rest without oxygen and with activity continued desaturation with supplemental oxygen Kevin [name], [name]

## 2018-05-11 NOTE — Progress Notes (Signed)
Inpatient Diabetes Program Recommendations  AACE/ADA: New Consensus Statement on Inpatient Glycemic Control (2015)  Target Ranges:  Prepandial:   less than 140 mg/dL      Peak postprandial:   less than 180 mg/dL (1-2 hours)      Critically ill patients:  140 - 180 mg/dL   Lab Results  Component Value Date   GLUCAP 302 (H) 05/11/2018   HGBA1C 7.6 (H) 03/17/2018    Review of Glycemic ControlResults for LORI, LIEW (MRN [id number]) as of 05/11/2018 12:04  Ref. Range 05/10/2018 07:37 05/10/2018 10:13 05/10/2018 12:01 05/10/2018 17:08 05/10/2018 19:56 05/10/2018 22:24 05/11/2018 07:32  Glucose-Capillary Latest Ref Range: 70 - 99 mg/dL 168 (H) 178 (H) 194 (H) 387 (H) 425 (H) 382 (H) 302 (H)    Diabetes history: Type 2 DM  Outpatient Diabetes medications: Amaryl 4 mg daily, Metformin 1000 mg bid, Januvia 100 mg daily Current orders for Inpatient glycemic control:  Novolog sensitive tid with meals, Tradjenta 5 mg daily Inpatient Diabetes Program Recommendations:   Please consider adding Lantus 14 units daily while in the hospital.   Thanks,  Adah Perl, RN, BC-ADM Inpatient Diabetes Coordinator Pager [phone] (8a-5p)

## 2018-05-11 NOTE — Care Management Note (Signed)
Case Management Note  Patient Details  Name: Amaree Leeper MRN: [id number] Date of Birth: 08/08/1941  Subjective/Objective:    CHF and Dehiscence of Left Transmetatarsal amputation                Action/Plan: Patient lives at home with his spouse; PCP: [name], MD; has private insurance with Medicare; [facility] choice [facility]; Recently discharged from [facility]; [facility] was arranged with [place]; Dan with [facility] called for resumption of services; DME - wheelchair. Walker, cane, 3:1, shower chair; patient is requiring home oxygen; oxygen arranged with [employer]; Ashly with [employer] called for arrangements  Expected Discharge Date:    05/11/2018              Expected Discharge Plan:  [place]  Discharge planning Services  CM Consult Choice offered to:  Patient  HH Arranged:  RN, PT [facility] Agency:  [place]  Status of Service:  In process, will continue to follow  Sherrilyn Rist [phone] 05/11/2018, 2:54 PM

## 2018-05-11 NOTE — Telephone Encounter (Signed)
Call, okay to restart Eliquis.

## 2018-05-11 NOTE — Telephone Encounter (Signed)
Message on triage phone. Needs to confirm d/c instructions for pt and needs to know if ok for pt to be put back on Eliquis?

## 2018-05-11 NOTE — Consult Note (Signed)
   [id number] CM Inpatient Consult   05/11/2018  Kevin [name] 07-18-1941 [id number]   Patient screened for potential [place] [facility] services with high risk re-admission score for unplanned admissions.. Patient is in the [place] of the [place] Management services under patient's Medicare plan. Met with the patient and wife at the bedside.  Patient states he had previously been with inpatient rehab.  His current plan is to return home with home health with [place].  They were interested in the brochure and 24 hour nurse line.  Given and encouraged to call.  They deny and [facility] Care Management needs at this time.    Please place a [facility] Care Management consult or for questions contact:   Natividad Brood, RN BSN [place] Liaison  [phone] business mobile phone Toll free office [phone]

## 2018-05-11 NOTE — Anesthesia Postprocedure Evaluation (Signed)
Anesthesia Post Note  Patient: Kevin [name]  Procedure(s) Performed: REVISION LEFT TRANSMETATARSAL AMPUTATION (Left )     Patient location during evaluation: PACU Anesthesia Type: General Level of consciousness: awake and alert Pain management: pain level controlled Vital Signs Assessment: post-procedure vital signs reviewed and stable Respiratory status: spontaneous breathing, nonlabored ventilation, respiratory function stable and patient connected to nasal cannula oxygen Cardiovascular status: blood pressure returned to baseline and stable Postop Assessment: no apparent nausea or vomiting Anesthetic complications: no    Last Vitals:  Vitals:   05/11/18 1009 05/11/18 1018  BP: 113/61   Pulse:  60  Resp:    Temp:    SpO2:      Last Pain:  Vitals:   05/11/18 1004  TempSrc:   PainSc: 8                  Tiajuana Amass

## 2018-05-11 NOTE — Discharge Summary (Signed)
Physician Discharge Summary  Kevin [name] ZSW:[id number] DOB: 1941-08-17 DOA: 05/06/2018  PCP: [name], MD  Admit date: 05/06/2018 Discharge date: 05/12/2018  Admitted From: Home  Disposition:  Home with home health   Recommendations for Outpatient Follow-up:  1. Follow up with PCP in 1 weeks 2. Please obtain BMP in one week on new furosemide dose to monitor Cr, K 3. Please re-evaluate O2 needs and discontinue home Oxygen if able 4. Follow up with [name] Given in 1 week 5. Follow up with Cardiology as indicated    Home Health: Yes  Equipment/Devices: Wound vac, wheelchair  Discharge Condition: Good  CODE STATUS: FULL Diet recommendation: Cardiac, diabetic  Brief/Interim Summary: Kevin [name] is a 77 y.o. 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      Discharge Diagnoses:   Acute on chronic CHF Was diuresed 8.6 L with IV Lasix.  Cardiology were consulted.  His creatinine remained stable, and his dose of Lasix was adjusted.  He was discharged with no peripheral edema, but did still require oxygen at rest and with ambulation, will plan to wean off as an outpatient.  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  Paroxysmal atrial fibrillation CHA2DS2-Vasc 5, on Eliquis.  Doses of metoprolol and diltiazem were adjusted, Eliquis restarted post-operatively.  Chronic kidney disease stage III Stable renal function.  Diabetes Glucoses well controlled in hospital.  History of stroke       Discharge Instructions  Discharge Instructions    Diet - low sodium heart healthy   Complete by:  As directed    Discharge instructions   Complete by:  As directed    From Dr. Loleta Books: You were admitted for congestive heart  failure. You were treated with diuretics, and appear to have improved.  Going forward, take the following adjusted doses of your medicines, according to Dr. Loletha Grayer: Take furosemide/Lasix 40 mg daily Take metoprolol XL 200 mg daily Take diltiazem CD 240 mg daily   Resume your normal blood sugar/diabetes medicines. I have arranged for home health.  Take oxycodone for pain You may use up to every 6 hours if needed. Use Miralax or a stool softener if you use oxycodone, always. Dispose of all leftover pain medicine.   Follow up with [name] Given in 1 week (call his office [phone] for an appointment if needed) Follow up with Dr. Marisue Humble as scheduled next week Follow up with Cardiology next Friday as scheduled   Have Dr. Marisue Humble check your labs (kidney function and electrolytes) in 1 week   Increase activity slowly   Complete by:  As directed      Allergies as of 05/11/2018      Reactions   Sulfa Antibiotics Rash      Medication List    TAKE these medications   apixaban 5 MG Tabs tablet Commonly known as:  ELIQUIS Take 1 tablet (5 mg total) by mouth 2 (two) times daily.   diltiazem 240 MG 24 hr capsule Commonly known as:  CARDIZEM CD Take 1 capsule (240 mg total) by mouth daily.   FLUoxetine 20 MG capsule Commonly known as:  PROZAC Take 20 mg by mouth daily.   fluticasone 50 MCG/ACT nasal spray Commonly known as:  FLONASE Place 2 sprays into both nostrils at bedtime.  furosemide 20 MG tablet Commonly known as:  LASIX Take 2 tablets (40 mg total) by mouth daily. What changed:  how much to take   glimepiride 4 MG tablet Commonly known as:  AMARYL Take 4 mg by mouth daily with breakfast.   mesalamine 0.375 g 24 hr capsule Commonly known as:  APRISO Take 750 mg by mouth 2 (two) times daily.   metFORMIN 500 MG tablet Commonly known as:  GLUCOPHAGE Take 1,000 mg by mouth 2 (two) times daily with a meal.   metoprolol 200 MG 24 hr tablet Commonly known as:   TOPROL-XL Take 1 tablet (200 mg total) by mouth daily. Take with or immediately following a meal. What changed:  medication strength   multivitamin with minerals Tabs tablet Take 1 tablet by mouth daily.   mupirocin ointment 2 % Commonly known as:  BACTROBAN Apply 1 application topically daily.   oxyCODONE 5 MG immediate release tablet Commonly known as:  Oxy IR/ROXICODONE Take 1 tablet (5 mg total) by mouth every 6 (six) hours as needed for up to 7 days for moderate pain (pain score 4-6).   rosuvastatin 10 MG tablet Commonly known as:  CRESTOR Take 0.5 tablets (5 mg total) by mouth daily.   sitaGLIPtin 100 MG tablet Commonly known as:  JANUVIA Take 100 mg by mouth daily.   tamsulosin 0.4 MG Caps capsule Commonly known as:  FLOMAX Take 1 capsule (0.4 mg total) by mouth 2 times daily at 12 noon and 4 pm.      Follow-up Information    [name], MD In 1 week.   Specialty:  Orthopedic Surgery Contact information: [place] [address] [phone]        Isaiah Serge, NP Follow up on 05/19/2018.   Specialties:  Cardiology, Radiology Why:  Please arrive 15 minutes early for your 11:30am appointment Contact information: [address] [place] [address] [place] Follow up.   Why:  They will do your home health care at your home Contact information: [address] [place] [address] [phone]        Inc[facility] [place] Follow up.   Why:  They will supply your home oxygen Contact information: [address] [place] [address] [phone]          Allergies  Allergen Reactions  . Sulfa Antibiotics Rash    Consultations:  Cardiology  Podiatry   Procedures/Studies: Dg Chest 2 View  Result Date: 04/27/2018 CLINICAL DATA:  Shortness of breath EXAM: CHEST - 2 VIEW COMPARISON:  03/17/2018, 10/20/2014 FINDINGS: Hyperinflation. Small pleural  effusions. Increased interstitial and ground-glass opacity in the right greater than left lungs. Heart size within normal limits. Aortic atherosclerosis. No pneumothorax. IMPRESSION: 1. Small pleural effusions. Increased interstitial and ground-glass opacity in the right greater than left lung, may reflect edema or interstitial inflammatory process. Electronically Signed   By: Donavan Foil M.D.   On: 04/27/2018 16:29   Dg Chest Port 1 View  Result Date: 05/06/2018 CLINICAL DATA:  Acute onset of respiratory distress. Shortness of breath. EXAM: PORTABLE CHEST 1 VIEW COMPARISON:  Chest radiograph performed 04/27/2018 FINDINGS: The lungs are well-aerated. Bilateral midlung and bibasilar airspace opacities raise concern for pulmonary edema, with a small left pleural effusion. No pneumothorax is seen. Mild vascular congestion is noted. The cardiomediastinal silhouette is within normal limits. No acute osseous abnormalities  are seen. IMPRESSION: Bilateral midlung and bibasilar airspace opacities raise concern for pulmonary edema, with a small left pleural effusion, mildly worsened from the prior study. Mild vascular congestion noted. Electronically Signed   By: Garald Balding M.D.   On: 05/06/2018 03:46      Subjective: Feeling well.  No dyspnea or orthopnea.  No fever, cough.  Pain in his left foot is well controlled with oral pain medicine.  Discharge Exam: Vitals:   05/11/18 1145 05/11/18 1159  BP:  (!) 106/55  Pulse: 78 (!) 56  Resp:    Temp:    SpO2: (!) 87% 93%   Vitals:   05/11/18 1009 05/11/18 1018 05/11/18 1145 05/11/18 1159  BP: 113/61   (!) 106/55  Pulse:  60 78 (!) 56  Resp:      Temp:      TempSrc:      SpO2:   (!) 87% 93%  Weight:      Height:        General: Pt is alert, awake, not in acute distress, lying flat in bed, conversational Cardiovascular: RRR, S1/S2 +, no rubs, no gallops Respiratory: CTA bilaterally, no wheezing, no rhonchi Abdominal: Soft, NT, ND, bowel  sounds + Extremities: no edema, no cyanosis, left foot transmetatarsal amputation with scant bloody drainage in the wound VAC canister    The results of significant diagnostics from this hospitalization (including imaging, microbiology, ancillary and laboratory) are listed below for reference.     Microbiology: Recent Results (from the past 240 hour(s))  Surgical pcr screen     Status: None   Collection Time: 05/10/18  2:02 AM  Result Value Ref Range Status   MRSA, PCR NEGATIVE NEGATIVE Final   Staphylococcus aureus NEGATIVE NEGATIVE Final    Comment: (NOTE) The Xpert SA Assay (FDA approved for NASAL specimens in patients 73 years of age and older), is one component of a comprehensive surveillance program. It is not intended to diagnose infection nor to guide or monitor treatment. Performed at [facility], [place] [address] [place], [place] [address]      Labs: BNP (last 3 results) Recent Labs    03/17/18 1148 05/06/18 0328  BNP 358.5* 124.5*   Basic Metabolic Panel: Recent Labs  Lab 05/06/18 0618 05/08/18 0355 05/09/18 0534 05/10/18 0357 05/11/18 0540  NA 137 133* 134* 133* 132*  K 4.0 3.1* 3.8 3.3* 4.6  CL 101 94* 94* 93* 93*  CO2 _0 GLUCOSE 198* 176* 188* 176* 326*  BUN _1 24* 28*  CREATININE 1.53* 1.29* 1.32* 1.34* 1.45*  CALCIUM 8.9 8.5* 8.7* 8.9 8.8*  MG 1.5* 1.7  --   --   --    Liver Function Tests: Recent Labs  Lab 05/06/18 0328 05/06/18 0618  AST 22 18  ALT 22 23  ALKPHOS 111 95  BILITOT 1.0 0.8  PROT 7.0 7.2  ALBUMIN 3.2* 3.2*   No results for input(s): LIPASE, AMYLASE in the last 168 hours. No results for input(s): AMMONIA in the last 168 hours. CBC: Recent Labs  Lab 05/06/18 0328 05/06/18 0618 05/07/18 0516 05/08/18 0355 05/09/18 0534 05/10/18 0357 05/11/18 0540  WBC 20.2* 15.4* 13.3* 13.2* 12.5* 10.8* 12.4*  NEUTROABS 16.7* 12.6*  --   --   --   --   --   HGB 9.8* 9.3* 9.3* 8.8* 8.8* 9.1* 8.7*  HCT  31.6* 30.7* 29.9* 27.4* 27.9* 28.9* 27.9*  MCV 88.8 88.5 88.2 85.9  85.8 85.0 85.3  PLT 408* 372 323 302 298 343 362   Cardiac Enzymes: Recent Labs  Lab 05/06/18 0618 05/06/18 1134 05/06/18 1750  TROPONINI <0.03 <0.03 <0.03   BNP: Invalid input(s): POCBNP CBG: Recent Labs  Lab 05/10/18 1956 05/10/18 2224 05/11/18 0732 05/11/18 1206 05/11/18 1646  GLUCAP 425* 382* 302* 270* 306*   D-Dimer No results for input(s): DDIMER in the last 72 hours. Hgb A1c No results for input(s): HGBA1C in the last 72 hours. Lipid Profile No results for input(s): CHOL, HDL, LDLCALC, TRIG, CHOLHDL, LDLDIRECT in the last 72 hours. Thyroid function studies No results for input(s): TSH, T4TOTAL, T3FREE, THYROIDAB in the last 72 hours.  Invalid input(s): FREET3 Anemia work up No results for input(s): VITAMINB12, FOLATE, FERRITIN, TIBC, IRON, RETICCTPCT in the last 72 hours. Urinalysis    Component Value Date/Time   COLORURINE YELLOW 03/16/2018 2125   APPEARANCEUR HAZY (A) 03/16/2018 2125   LABSPEC 1.011 03/16/2018 2125   PHURINE 6.0 03/16/2018 2125   GLUCOSEU >=500 (A) 03/16/2018 2125   HGBUR LARGE (A) 03/16/2018 2125   BILIRUBINUR NEGATIVE 03/16/2018 2125   KETONESUR 5 (A) 03/16/2018 2125   PROTEINUR 30 (A) 03/16/2018 2125   NITRITE NEGATIVE 03/16/2018 2125   LEUKOCYTESUR LARGE (A) 03/16/2018 2125   Sepsis Labs Invalid input(s): PROCALCITONIN,  WBC,  LACTICIDVEN Microbiology Recent Results (from the past 240 hour(s))  Surgical pcr screen     Status: None   Collection Time: 05/10/18  2:02 AM  Result Value Ref Range Status   MRSA, PCR NEGATIVE NEGATIVE Final   Staphylococcus aureus NEGATIVE NEGATIVE Final    Comment: (NOTE) The Xpert SA Assay (FDA approved for NASAL specimens in patients 31 years of age and older), is one component of a comprehensive surveillance program. It is not intended to diagnose infection nor to guide or monitor treatment. Performed at [facility], [place] [address] [place] [address], [place], [place] [address]      Time coordinating discharge: 40 minutes The [place] controlled substances registry was reviewed for this patient prior to filling the <5 days supply controlled substances script.      SIGNED:   Edwin Dada, MD  [facility] 05/12/2018, 5:55 AM

## 2018-05-11 NOTE — Progress Notes (Signed)
ANTICOAGULATION CONSULT NOTE - Follow Up Consult  Pharmacy Consult for Heparin Indication: atrial fibrillation  Allergies  Allergen Reactions  . Sulfa Antibiotics Rash   Patient Measurements: Height: 6\' 1"  (185.4 cm) Weight: 206 lb 9.1 oz (93.7 kg) IBW/kg (Calculated) : 79.9 Heparin Dosing Weight:  94.3 kg  Vital Signs: Temp: 97.7 F (36.5 C) (08/15 0349) Temp Source: Oral (08/15 0349) BP: 126/61 (08/15 0349) Pulse Rate: 58 (08/15 0349)  Labs: Recent Labs    05/09/18 0534 05/09/18 1453 05/09/18 2208 05/10/18 0357 05/11/18 0540  HGB 8.8*  --   --  9.1* 8.7*  HCT 27.9*  --   --  28.9* 27.9*  PLT 298  --   --  343 362  APTT 56* 54* 85*  --   --   HEPARINUNFRC 0.38 0.28* 0.54 0.44 0.50  CREATININE 1.32*  --   --  1.34* 1.45*   Estimated Creatinine Clearance: 50.5 mL/min (A) (by C-G formula based on SCr of 1.45 mg/dL (H)).  Assessment:  77yo male s/p OR yesterday for dehiscence of left transmetatarsal amputation.  Spoke with nurse after case yesterday and she called Ortho to determine the restart for his IV heparin.  We resumed at his last rate since he had been therapeutic.  Today he remains therapeutic on 2550 units/hr at 0.5IU/ml.  Anticoag: heparin for Afib. On eliquis last dose 8/9 2000. Switching to heparin d/t bleed from foot stump on Eliquis.   Goal of Therapy:  Heparin level 0.3-0.7 units/mL Monitor platelets by anticoagulation protocol: Yes   Plan:  Cont heparin at 2550 units/hr Monitor daily HL/CBC while on heparin  Rober Minion, PharmD., MS Clinical Pharmacist Pager:  [phone] Thank you for allowing pharmacy to be part of this patients care team.

## 2018-05-11 NOTE — Progress Notes (Signed)
I agree with the above plan 

## 2018-05-11 NOTE — Telephone Encounter (Signed)
See note, ok to restart the eliquis?

## 2018-05-11 NOTE — Evaluation (Signed)
Physical Therapy Evaluation Patient Details Name: Kevin [name] MRN: [id number] DOB: 01/22/1941 Today's Date: 05/11/2018   History of Present Illness  77 yo with left transmet dehiscence s/p revision 8/14. PMHx: DM, PVD, neuropathy, AFib, HTN, CKD, CVA  Clinical Impression  Pt supine on arrival and hesitant for mobility but willing to perform limited gait. Pt reports he was walking with TDWB on LLE PTA and when he was NWB prior had functioned at W/C level. Pt with unsteady gait and difficulty with standing with RW with NWB status and recommend gait only with therapy and to transition back to W/C level while NWB, pt and wife in agreement. Pt with decreased function, strength, and safety who will benefit from acute therapy to maximize mobility and function. Pt encouraged to perform bil LE exercises daily to maintain strength while not walking.   Pt SpO2 on 2L on arrival 96% with removal of O2 sats dropped to 87% supine With 3L able to achieve 92% with cues for pursed lip breathing With limited gait on 3L SpO2 dropped to 84% with seated rest and rise to 5L to achieve 95% 94% on 2L end of session with HR 78    Follow Up Recommendations Home health PT;Supervision for mobility/OOB    Equipment Recommendations  None recommended by PT    Recommendations for Other Services       Precautions / Restrictions Precautions Precautions: Fall Precaution Comments: VAC Restrictions Weight Bearing Restrictions: Yes LLE Weight Bearing: Non weight bearing      Mobility  Bed Mobility Overal bed mobility: Modified Independent                Transfers Overall transfer level: Needs assistance   Transfers: Sit to/from Stand Sit to Stand: Min assist         General transfer comment: cues for hand placement not to pull on RW with assist to rise from home height bed.  Ambulation/Gait Ambulation/Gait assistance: Mod assist Gait Distance (Feet): 15 Feet Assistive device: Rolling  walker (2 wheeled) Gait Pattern/deviations: Step-to pattern   Gait velocity interpretation: <1.8 ft/sec, indicate of risk for recurrent falls General Gait Details: cues for bending left knee with gait, min assist with majority of gait with mod assist for last 2' and turn due to fatigue. Cues for safety and sequence.   Stairs            Wheelchair Mobility    Modified Rankin (Stroke Patients Only)       Balance Overall balance assessment: Needs assistance   Sitting balance-Leahy Scale: Good       Standing balance-Leahy Scale: Poor Standing balance comment: pt with bil UE support standing on RLE in standing, cautious and unsteady                             Pertinent Vitals/Pain Pain Assessment: No/denies pain    Home Living Family/patient expects to be discharged to:: Private residence Living Arrangements: Spouse/significant other Available Help at Discharge: Family;Friend(s);Available 24 hours/day Type of Home: House Home Access: Stairs to enter Entrance Stairs-Rails: Psychiatric nurse of Steps: 2 Home Layout: One level Home Equipment: Walker - 4 wheels;Wheelchair - manual;Bedside commode;Grab bars - tub/shower;Shower seat;Other (comment)(adjustable bed and bedrail)      Prior Function Level of Independence: Needs assistance   Gait / Transfers Assistance Needed: pt had returned to mod I with walking with RW and heel weight bearing on LLE PTA in home.  ADL's / Homemaking Assistance Needed: wife performs homemaking  Comments: prior to transmet was independent     Hand Dominance        Extremity/Trunk Assessment   Upper Extremity Assessment Upper Extremity Assessment: Overall WFL for tasks assessed    Lower Extremity Assessment Lower Extremity Assessment: Generalized weakness    Cervical / Trunk Assessment Cervical / Trunk Assessment: Normal  Communication   Communication: No difficulties  Cognition Arousal/Alertness:  Awake/alert Behavior During Therapy: WFL for tasks assessed/performed Overall Cognitive Status: Within Functional Limits for tasks assessed                                        General Comments      Exercises     Assessment/Plan    PT Assessment Patient needs continued PT services  PT Problem List Decreased strength;Decreased mobility;Decreased safety awareness;Decreased activity tolerance;Decreased balance;Decreased knowledge of use of DME;Decreased skin integrity;Impaired sensation;Cardiopulmonary status limiting activity       PT Treatment Interventions Gait training;Therapeutic exercise;Patient/family education;Functional mobility training;DME instruction;Therapeutic activities;Balance training    PT Goals (Current goals can be found in the Care Plan section)  Acute Rehab PT Goals Patient Stated Goal: return home PT Goal Formulation: With patient/family Time For Goal Achievement: 05/25/18 Potential to Achieve Goals: Fair    Frequency Min 3X/week   Barriers to discharge        Co-evaluation               AM-PAC PT "6 Clicks" Daily Activity  Outcome Measure Difficulty turning over in bed (including adjusting bedclothes, sheets and blankets)?: A Little Difficulty moving from lying on back to sitting on the side of the bed? : A Little Difficulty sitting down on and standing up from a chair with arms (e.g., wheelchair, bedside commode, etc,.)?: Unable Help needed moving to and from a bed to chair (including a wheelchair)?: A Little Help needed walking in hospital room?: A Lot Help needed climbing 3-5 steps with a railing? : A Lot 6 Click Score: 14    End of Session Equipment Utilized During Treatment: Gait belt;Oxygen Activity Tolerance: Patient limited by fatigue Patient left: in chair;with call bell/phone within reach;with family/visitor present Nurse Communication: Mobility status;Precautions PT Visit Diagnosis: Other abnormalities of gait  and mobility (R26.89);Muscle weakness (generalized) (M62.81);Unsteadiness on feet (R26.81)    Time: 0626-9485 PT Time Calculation (min) (ACUTE ONLY): 31 min   Charges:   PT Evaluation $PT Eval Moderate Complexity: 1 Mod PT Treatments $Therapeutic Activity: 8-22 mins        Elwyn Reach, [place]   Sandy Salaam Prater 05/11/2018, 1:24 PM

## 2018-05-11 NOTE — Progress Notes (Signed)
Progress Note  Patient Name: Kevin [name] Date of Encounter: 05/11/2018  Primary Cardiologist: Lauree Chandler, MD   Subjective   Did well with surgery, has very little pain.  No significant bleeding even after heparin was restarted.  Reading is excellent even when lying flat in bed.  Remains in sinus rhythm.  Inpatient Medications    Scheduled Meds: . diltiazem  240 mg Oral Daily  . docusate sodium  100 mg Oral BID  . FLUoxetine  20 mg Oral Daily  . fluticasone  2 spray Each Nare QHS  . furosemide  60 mg Oral BID  . insulin aspart  0-9 Units Subcutaneous TID WC  . linagliptin  5 mg Oral Daily  . mesalamine  0.75 g Oral BID  . metoprolol succinate  200 mg Oral Daily  . multivitamin with minerals  1 tablet Oral Daily  . mupirocin cream   Topical Daily  . rosuvastatin  5 mg Oral Daily  . tamsulosin  0.4 mg Oral BID   Continuous Infusions: . sodium chloride    . heparin 2,550 Units/hr (05/11/18 0600)  . methocarbamol (ROBAXIN) IV     PRN Meds: acetaminophen, bisacodyl, HYDROmorphone (DILAUDID) injection, magnesium citrate, methocarbamol **OR** methocarbamol (ROBAXIN) IV, metoCLOPramide **OR** metoCLOPramide (REGLAN) injection, ondansetron **OR** ondansetron (ZOFRAN) IV, oxyCODONE, oxyCODONE, polyethylene glycol   Vital Signs    Vitals:   05/10/18 1923 05/11/18 0011 05/11/18 0349 05/11/18 1009  BP: (!) 110/56 126/64 126/61 113/61  Pulse: 72 64 (!) 58   Resp: 18 18 18    Temp: (!) 97.4 F (36.3 C) 98.2 F (36.8 C) 97.7 F (36.5 C)   TempSrc: Oral Oral Oral   SpO2: (!) 89% 93% 90%   Weight:   93.7 kg   Height:        Intake/Output Summary (Last 24 hours) at 05/11/2018 1017 Last data filed at 05/11/2018 0841 Gross per 24 hour  Intake 2445.52 ml  Output 3775 ml  Net -1329.48 ml   Filed Weights   05/10/18 0524 05/10/18 1002 05/11/18 0349  Weight: 91.3 kg 91.3 kg 93.7 kg    Telemetry    Sinus rhythm with PACs- Personally Reviewed  Physical Exam    Comfortable lying flat GEN: No acute distress.   Neck: No JVD, no carotid bruits Cardiac: Occasional ectopy on background of RRR, no murmurs, rubs, or gallops.  Respiratory: Clear to auscultation bilaterally, no wheezes/ rales/ rhonchi GI: NABS, Soft, nontender, non-distended  MS: No edema; s/p metatarsal amputation left foot, wound VAC in place with very little bloody oozing. Neuro:  Nonfocal, moving all extremities spontaneously Psych: Normal affect   Labs    Chemistry Recent Labs  Lab 05/06/18 0328 05/06/18 0618  05/09/18 0534 05/10/18 0357 05/11/18 0540  NA 134* 137   < > 134* 133* 132*  K 3.9 4.0   < > 3.8 3.3* 4.6  CL 100 101   < > 94* 93* 93*  CO2 23 26   < > 29 28 30   GLUCOSE 234* 198*   < > 188* 176* 326*  BUN 20 19   < > 18 24* 28*  CREATININE 1.47* 1.53*   < > 1.32* 1.34* 1.45*  CALCIUM 8.9 8.9   < > 8.7* 8.9 8.8*  PROT 7.0 7.2  --   --   --   --   ALBUMIN 3.2* 3.2*  --   --   --   --   AST 22 18  --   --   --   --  ALT 22 23  --   --   --   --   ALKPHOS 111 95  --   --   --   --   BILITOT 1.0 0.8  --   --   --   --   GFRNONAA 45* 43*   < > 51* 51* 46*  GFRAA 52* 50*   < > 60* 59* 53*  ANIONGAP 11 10   < > 11 12 9    < > = values in this interval not displayed.     Hematology Recent Labs  Lab 05/09/18 0534 05/10/18 0357 05/11/18 0540  WBC 12.5* 10.8* 12.4*  RBC 3.25* 3.40* 3.27*  HGB 8.8* 9.1* 8.7*  HCT 27.9* 28.9* 27.9*  MCV 85.8 85.0 85.3  MCH 27.1 26.8 26.6  MCHC 31.5 31.5 31.2  RDW 14.2 14.1 13.7  PLT 298 343 362    Cardiac Enzymes Recent Labs  Lab 05/06/18 0618 05/06/18 1134 05/06/18 1750  TROPONINI <0.03 <0.03 <0.03    Recent Labs  Lab 05/06/18 0354  TROPIPOC 0.02     BNP Recent Labs  Lab 05/06/18 0328  BNP 379.3*     DDimer No results for input(s): DDIMER in the last 168 hours.   Radiology    No results found.  Cardiac Studies   Echo 03/17/2018 LV EF: 60% - 65%  Study Conclusions  - Left ventricle: The  cavity size was normal. Systolic function wasnormal. The estimated ejection fraction was in the range of 60%to 65%. Wall motion was normal; there were no regional wallmotion abnormalities. There was no evidence of elevatedventricular filling pressure by Doppler parameters. - Left atrium: The atrium was moderately dilated.  Impressions:  - No cardiac source of emboli was indentified.  Patient Profile     77 y.o.malewith PMH of HTN, DM II, former tobacco abuse, CVA, osteomyelitis and persistent afib,recently possiblestroke and osteomyelitis requiring L transmetatarsal amputation in 7/19 presented with acute on chronic diastolic HF.  Assessment & Plan    1. Acute on chronic diastolic CHF: transitioned to po lasix yesterday with UOP net -913mL in the last 24 hours and -8L total this admission. Despite continue net negative output, his weight is up to 93.7kg today from 91.3kg yesterday - suspect this is a bed weight and will ask for a repeat weight. Cr up to 1.45 today with baseline Cr ~1.2-1.3.  - Continue po lasix 40mg  at discharge - Continue metoprolol   2. Persistent atrial fibrillation: s/p DCCV 05/05/18. He has been maintaining sinus rhythm and has been on a heparin gtt this admission given surgery needs. Rate is well controlled.  - Continue metoprolol and diltiazem for rate control - Resume apixaban if cleared by surgery  3. 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 Now s/p debridement 05/10/18  - Continue management per primary team and ortho   [facility] will sign off.   Medication Recommendations:  Continue metoprolol and diltiazem for afib rate control. Resume apixaban 5mg  BID. Discharge on 40mg  lasix daily.  Other recommendations (labs, testing, etc):  Check BMET at next follow up visit Follow up as an outpatient:  Appointment with Cecilie Kicks, PA-C 05/19/18 at 11:30am   For questions or updates, please  contact [place] Please consult [email or web address] for contact info under Cardiology/STEMI.      Signed, Abigail Butts, PA-C  05/11/2018, 10:17 AM   [phone]  I have seen and examined the patient along with Abigail Butts,  PA-C , PA NP.  I have reviewed the chart, notes and new data.  I agree with PA/NP's note.  Key new complaints: Breathing has improved substantially.  Able to lie fully flat Key examination changes: Does not have jugular venous distention, edema or pulmonary rales.  Very little bloody drainage via wound VAC at transmetatarsal amputation/debridement site Key new findings / data: Normal potassium, stable creatinine and hemoglobin  PLAN: Ready to transition from intravenous heparin back to oral anticoagulants, will discharge on furosemide 40 mg once daily.  Instructed on sodium restriction and daily weights.  Always if he gains more than 3 pounds from discharge weight.  Has early follow-up already scheduled.  Sanda Klein, MD, [place] [phone] 05/11/2018, 11:27 AM

## 2018-05-11 NOTE — Telephone Encounter (Signed)
IC and advised ok to restart the Eliquis.   Call patient and sched postop appt

## 2018-05-11 NOTE — Clinical Social Work Note (Signed)
CSW acknowledges SNF consult. PT recommending HHPT.  CSW signing off. Consult again if any other social work needs arise.  Sarah Boswell, CSW [phone]  

## 2018-05-12 ENCOUNTER — Telehealth (INDEPENDENT_AMBULATORY_CARE_PROVIDER_SITE_OTHER): Payer: Self-pay | Admitting: Orthopedic Surgery

## 2018-05-12 DIAGNOSIS — Z4781 Encounter for orthopedic aftercare following surgical amputation: Secondary | ICD-10-CM | POA: Diagnosis not present

## 2018-05-12 DIAGNOSIS — E1140 Type 2 diabetes mellitus with diabetic neuropathy, unspecified: Secondary | ICD-10-CM | POA: Diagnosis not present

## 2018-05-12 DIAGNOSIS — I480 Paroxysmal atrial fibrillation: Secondary | ICD-10-CM | POA: Diagnosis not present

## 2018-05-12 DIAGNOSIS — E1169 Type 2 diabetes mellitus with other specified complication: Secondary | ICD-10-CM | POA: Diagnosis not present

## 2018-05-12 DIAGNOSIS — M86272 Subacute osteomyelitis, left ankle and foot: Secondary | ICD-10-CM | POA: Diagnosis not present

## 2018-05-12 DIAGNOSIS — I129 Hypertensive chronic kidney disease with stage 1 through stage 4 chronic kidney disease, or unspecified chronic kidney disease: Secondary | ICD-10-CM | POA: Diagnosis not present

## 2018-05-12 NOTE — Telephone Encounter (Signed)
Can you please call and sched post op appt with Sharol Given?

## 2018-05-12 NOTE — Telephone Encounter (Signed)
Called patient left message on voicemail to return call to schedule appointment with Dr Sharol Given    ([place])

## 2018-05-15 DIAGNOSIS — Z4781 Encounter for orthopedic aftercare following surgical amputation: Secondary | ICD-10-CM | POA: Diagnosis not present

## 2018-05-15 DIAGNOSIS — M86272 Subacute osteomyelitis, left ankle and foot: Secondary | ICD-10-CM | POA: Diagnosis not present

## 2018-05-15 DIAGNOSIS — I480 Paroxysmal atrial fibrillation: Secondary | ICD-10-CM | POA: Diagnosis not present

## 2018-05-15 DIAGNOSIS — E1140 Type 2 diabetes mellitus with diabetic neuropathy, unspecified: Secondary | ICD-10-CM | POA: Diagnosis not present

## 2018-05-15 DIAGNOSIS — I129 Hypertensive chronic kidney disease with stage 1 through stage 4 chronic kidney disease, or unspecified chronic kidney disease: Secondary | ICD-10-CM | POA: Diagnosis not present

## 2018-05-15 DIAGNOSIS — E1169 Type 2 diabetes mellitus with other specified complication: Secondary | ICD-10-CM | POA: Diagnosis not present

## 2018-05-17 ENCOUNTER — Encounter (INDEPENDENT_AMBULATORY_CARE_PROVIDER_SITE_OTHER): Payer: Self-pay | Admitting: Orthopedic Surgery

## 2018-05-17 ENCOUNTER — Ambulatory Visit (INDEPENDENT_AMBULATORY_CARE_PROVIDER_SITE_OTHER): Payer: Medicare Other | Admitting: Physician Assistant

## 2018-05-17 VITALS — Ht 73.0 in | Wt 195.0 lb

## 2018-05-17 DIAGNOSIS — Z7984 Long term (current) use of oral hypoglycemic drugs: Secondary | ICD-10-CM | POA: Diagnosis not present

## 2018-05-17 DIAGNOSIS — M86272 Subacute osteomyelitis, left ankle and foot: Secondary | ICD-10-CM

## 2018-05-17 DIAGNOSIS — E119 Type 2 diabetes mellitus without complications: Secondary | ICD-10-CM | POA: Diagnosis not present

## 2018-05-17 DIAGNOSIS — E7800 Pure hypercholesterolemia, unspecified: Secondary | ICD-10-CM | POA: Diagnosis not present

## 2018-05-17 DIAGNOSIS — I10 Essential (primary) hypertension: Secondary | ICD-10-CM | POA: Diagnosis not present

## 2018-05-17 DIAGNOSIS — N400 Enlarged prostate without lower urinary tract symptoms: Secondary | ICD-10-CM | POA: Diagnosis not present

## 2018-05-17 DIAGNOSIS — Z89432 Acquired absence of left foot: Secondary | ICD-10-CM | POA: Diagnosis not present

## 2018-05-17 DIAGNOSIS — I509 Heart failure, unspecified: Secondary | ICD-10-CM | POA: Diagnosis not present

## 2018-05-17 DIAGNOSIS — R0902 Hypoxemia: Secondary | ICD-10-CM | POA: Diagnosis not present

## 2018-05-17 DIAGNOSIS — I4891 Unspecified atrial fibrillation: Secondary | ICD-10-CM | POA: Diagnosis not present

## 2018-05-17 DIAGNOSIS — K5190 Ulcerative colitis, unspecified, without complications: Secondary | ICD-10-CM | POA: Diagnosis not present

## 2018-05-17 NOTE — Progress Notes (Signed)
Office Visit Note   Patient: Kevin [name]           Date of Birth: [birth year]           MRN: [id number] Visit Date: 05/17/2018              Requested by: [name], MD [address] [place], [place] [address] PCP: [name], MD   Assessment & Plan: Visit Diagnoses:  1. History of transmetatarsal amputation of left foot (HCC)   2. Subacute osteomyelitis, left ankle and foot (HCC)     Plan: The VAC dressing was removed and he can begin soap and water cleaning to the left transmetatarsal area and apply dry gauze over the incisional area and Ace wrap for edema control.  follow up in 1 week.  Counseled patient to elevate the left lower extremity as much as possible higher than the level of his heart.  Also counseled patient not to bear weight as much as possible over the left lower extremity.  Follow-Up Instructions: Return in about 1 week (around 05/24/2018).   Orders:  No orders of the defined types were placed in this encounter.  No orders of the defined types were placed in this encounter.     Procedures: No procedures performed   Clinical Data: No additional findings.   Subjective: Chief Complaint  Patient presents with  . Left Foot - Routine Post Op    05/10/18 Revision Transmet of Left Foot    HPI The patient is a 77 year old male who underwent a revision of his left transmetatarsal amputation on 05/10/18.  He presents for postoperative follow-up.  He reports that he has had some bloody drainage in his VAC canister.  He was seen by [facility] home [facility] but they did leave the [facility] dressing in place.  He is on Eliquis for anticoagulation.  He otherwise reports that he has been doing well. Review of Systems   Objective: Vital Signs: Ht 6\' 1"  (1.854 m)   Wt 195 lb (88.5 kg)   BMI 25.73 kg/m   Physical Exam He is a thin elderly male who presents in wheelchair.  He is alert and oriented. Ortho Exam On examination of his left  foot the VAC dressing was removed and the incision is intact but under tension due to edema.  There is no signs of cellulitis no erythema or excessive warmth. Specialty Comments:  No specialty comments available.  Imaging: No results found.   PMFS History: Patient Active Problem List   Diagnosis Date Noted  . Dehiscence of amputation stump ([place])   . Acute CHF (congestive heart failure) ([place]) 05/07/2018  . Acute on chronic diastolic CHF (congestive heart failure) ([place])   . Acute on chronic congestive heart failure ([place]) 05/06/2018  . Renal insufficiency 05/06/2018  . Type 2 diabetes mellitus with vascular disease ([place]) 05/06/2018  . Debility 03/31/2018  . Urinary retention with incomplete bladder emptying 03/31/2018  . History of transmetatarsal amputation of left foot ([place])   . 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 ([place])   . Status post transmetatarsal amputation of left foot ([place])   . Postoperative pain   . Benign prostatic hyperplasia with urinary retention   . New onset atrial fibrillation ([place])   . Incontinence of feces   . Cerebrovascular accident (CVA) ([place])   . Benign essential HTN   . Diabetes mellitus type 2 in nonobese (HCC)   . Hypokalemia   . Paroxysmal atrial fibrillation (HCC)   .  Leukocytosis   . SIRS (systemic inflammatory response syndrome) (HCC)   . Acute blood loss anemia   . Stage 3 chronic kidney disease ([place])   . Essential hypertension 03/20/2018  . Tachycardia 03/20/2018  . Sepsis ([place]) 03/20/2018  . Diabetes ([place]) 03/20/2018  . Subacute osteomyelitis, left ankle and foot ([place]) 03/20/2018  . A-fib ([place]) 03/20/2018  . Stroke-like episode ([place]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 03/16/2018  . Strain of left tibialis anterior muscle 12/19/2015  . 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 Fracture of radius, distal, left, closed 12/19/2012   Past Medical History:  Diagnosis Date  . A-fib ([place]) 03/20/2018  . Acute blood loss anemia   . Benign essential  HTN   . Benign prostatic hyperplasia with urinary retention   . Cerebrovascular accident (CVA) ([place])   . CKD (chronic kidney disease), stage III ([place])   . Diabetes ([place]) 03/20/2018  . Diabetes mellitus type 2 in nonobese (HCC)   . Essential hypertension 03/20/2018  . Former tobacco use   . H/O ulcerative colitis 1993  . Hypertension   . Hypokalemia   . Leukocytosis   . New onset atrial fibrillation ([place])   . Osteomyelitis ([place])    a. 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  [name] (paroxysmal atrial fibrillation) ([place])    a. dx 03/2018 in setting of stroke, osteomyelitis.  . Sepsis ([place]) 03/20/2018  . Stage 3 chronic kidney disease ([place])    pt/family unaware  . Status post transmetatarsal amputation of left foot ([place])   . Stroke ([place]) 03/2018  . Stroke-like episode ([place]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 03/16/2018  . Tachycardia 03/20/2018  . Wrist fracture    Left    Family History  Problem Relation Age of Onset  . Hypertension Father     Past Surgical History:  Procedure Laterality Date  . AMPUTATION Left 03/24/2018   Procedure: Transmetatarsal Amputation Left Foot;  Surgeon: [name], MD;  Location: [facility];  Service: Orthopedics;  Laterality: Left;  . CARDIOVERSION N/A 05/05/2018   Procedure: CARDIOVERSION;  Surgeon: [name], MD;  [facility];  Service: Cardiovascular;  Laterality: N/A;  . COLONOSCOPY  12/2017   benign, remission from ulcerative colitis in 90s  . I&D EXTREMITY Left 03/17/2018   Procedure: IRRIGATION AND DEBRIDEMENT FOOT;  Surgeon: [name], MD;  Location: [facility];  Service: Orthopedics;  Laterality: Left;  . STUMP REVISION Left 05/10/2018   Procedure: REVISION LEFT TRANSMETATARSAL AMPUTATION;  Surgeon: [name], MD;  Location: [facility];  Service: Orthopedics;  Laterality: Left;   Social History   Occupational History  . Not on file  Tobacco Use  . Smoking status: Former Smoker    Packs/day: 1.00    Types: Cigarettes    Start date: 1963     Last attempt to quit: 1985    Years since quitting: 34.6  . Smokeless tobacco: Never Used  Substance and Sexual Activity  . Alcohol use: Yes    Alcohol/week: 4.0 standard drinks    Types: 4 Cans of beer per week    Comment: social   . Drug use: Never  . Sexual activity: Not on file

## 2018-05-18 ENCOUNTER — Ambulatory Visit: Payer: Medicare Other | Admitting: Physical Medicine & Rehabilitation

## 2018-05-18 ENCOUNTER — Telehealth (INDEPENDENT_AMBULATORY_CARE_PROVIDER_SITE_OTHER): Payer: Self-pay | Admitting: Orthopedic Surgery

## 2018-05-18 DIAGNOSIS — E1140 Type 2 diabetes mellitus with diabetic neuropathy, unspecified: Secondary | ICD-10-CM | POA: Diagnosis not present

## 2018-05-18 DIAGNOSIS — M86272 Subacute osteomyelitis, left ankle and foot: Secondary | ICD-10-CM | POA: Diagnosis not present

## 2018-05-18 DIAGNOSIS — Z4781 Encounter for orthopedic aftercare following surgical amputation: Secondary | ICD-10-CM | POA: Diagnosis not present

## 2018-05-18 DIAGNOSIS — I129 Hypertensive chronic kidney disease with stage 1 through stage 4 chronic kidney disease, or unspecified chronic kidney disease: Secondary | ICD-10-CM | POA: Diagnosis not present

## 2018-05-18 DIAGNOSIS — E1169 Type 2 diabetes mellitus with other specified complication: Secondary | ICD-10-CM | POA: Diagnosis not present

## 2018-05-18 DIAGNOSIS — I480 Paroxysmal atrial fibrillation: Secondary | ICD-10-CM | POA: Diagnosis not present

## 2018-05-18 NOTE — Telephone Encounter (Signed)
Carrie nurse at [facility] is requesting wound care orders for this patient to wash with soap and water, apply dry dressing and secure with an ace bandage.  Carrie's # [phone]

## 2018-05-18 NOTE — Progress Notes (Addendum)
Cardiology Office Note   Date:  05/19/2018   ID:  Kevin [name], Kevin [name] 1940-10-08, MRN [id number]  PCP:  [name], MD  Cardiologist:  Dr. [name]     Chief Complaint  Patient presents with  . Atrial Fibrillation      History of Present Illness: Kevin [name] is a 77 y.o. male who presents for post hospitalization.  With acute on chronic diastolic HF, d/c wt of 40.8 Kg  A fib with DCCV 05/05/18 and SR at discharge.  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 Now s/p debridement 05/10/18   He has a history ofHTN, DM, former tobacco abuse, recently diagnosed stroke, osteomyelitis,persistentafib,RBBB while hospitalizedand probable CKD III. He was admitted on 8/10 with acute on chronic diastolic CHF. In June 2019, he was admitted with left-sided weakness, left facial droop and expressive deficits. CT of head was negative and he received TPA. Hospitalization notable for oseteomyelitis of foot s/p I&D of left foot ulcer and ultimately left transmetatarsal amputation in 7/19. Cardiology followed for initially PACs/PVCS then new onset of atrial fibrillation.  2D echo 03/17/18 showed EF 60-65%, mod LAE.  Patient has been short of breath with exertion ever since he was discharged from the hospital in 7/19. He was seen as an outpatient and given persistent atrial fibrillation with CHF symptoms, was set up for DCCV.  This was done on 05/05/18.  Due to worsening dyspnea, he came to the ER on 8/10 and was admitted for acute on chronic diastolic CHF. CXR showed pulmonary edema and he remained in NSR on ECG and telemetry. He has diuresed well so far with IV Lasix. He had bleeding at his left foot surgical site and Eliquis was stopped and with hospitalization IV heparin gtt.was used.    Today he is in wheelchair but foot is stable.  HH comes to his home and follows BP and pulse.  No SOB is able to sleep on one  pillow.  No swelling except Lt foot.  No chest pain and no palpitations.  No lightheadedness.  Today he remains in SB at 89 and BP is lower.  He is on high dose meds of dilt and BB.      Past Medical History:  Diagnosis Date  . A-fib ([place]) 03/20/2018  . Acute blood loss anemia   . Benign essential HTN   . Benign prostatic hyperplasia with urinary retention   . Cerebrovascular accident (CVA) ([place])   . CKD (chronic kidney disease), stage III ([place])   . Diabetes ([place]) 03/20/2018  . Diabetes mellitus type 2 in nonobese (HCC)   . Essential hypertension 03/20/2018  . Former tobacco use   . H/O ulcerative colitis 1993  . Hypertension   . Hypokalemia   . Leukocytosis   . New onset atrial fibrillation ([place])   . Osteomyelitis ([place])    a. 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  [name] (paroxysmal atrial fibrillation) ([place])    a. dx 03/2018 in setting of stroke, osteomyelitis.  . Sepsis ([place]) 03/20/2018  . Stage 3 chronic kidney disease ([place])    pt/family unaware  . Status post transmetatarsal amputation of left foot ([place])   . Stroke ([place]) 03/2018  . Stroke-like episode ([place]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 03/16/2018  . Tachycardia 03/20/2018  . Wrist fracture    Left    Past Surgical History:  Procedure Laterality Date  . AMPUTATION Left 03/24/2018   Procedure: Transmetatarsal Amputation Left  Foot;  Surgeon: [name], MD;  Location: [facility];  Service: Orthopedics;  Laterality: Left;  . CARDIOVERSION N/A 05/05/2018   Procedure: CARDIOVERSION;  Surgeon: [name], MD;  Location: [facility] ENDOSCOPY;  Service: Cardiovascular;  Laterality: N/A;  . COLONOSCOPY  12/2017   benign, remission from ulcerative colitis in 90s  . I&D EXTREMITY Left 03/17/2018   Procedure: IRRIGATION AND DEBRIDEMENT FOOT;  Surgeon: [name], MD;  Location: [facility];  Service: Orthopedics;  Laterality: Left;  . STUMP REVISION Left 05/10/2018   Procedure: REVISION LEFT TRANSMETATARSAL AMPUTATION;  Surgeon: [name], MD;   Location: [place];  Service: Orthopedics;  Laterality: Left;     Current Outpatient Medications  Medication Sig Dispense Refill  . apixaban (ELIQUIS) 5 MG TABS tablet Take 1 tablet (5 mg total) by mouth 2 (two) times daily. 180 tablet 3  . FLUoxetine (PROZAC) 20 MG capsule Take 20 mg by mouth daily.    . fluticasone (FLONASE) 50 MCG/ACT nasal spray Place 2 sprays into both nostrils at bedtime.    . furosemide (LASIX) 20 MG tablet Take 2 tablets (40 mg total) by mouth daily. 30 tablet 1  . glimepiride (AMARYL) 4 MG tablet Take 4 mg by mouth daily with breakfast.    . mesalamine (APRISO) 0.375 g 24 hr capsule Take 750 mg by mouth 2 (two) times daily.     . metFORMIN (GLUCOPHAGE) 500 MG tablet Take 1,000 mg by mouth 2 (two) times daily with a meal.     . metoprolol (TOPROL-XL) 200 MG 24 hr tablet Take 1 tablet (200 mg total) by mouth daily. Take with or immediately following a meal. 60 tablet 0  . Multiple Vitamin (MULTIVITAMIN WITH MINERALS) TABS tablet Take 1 tablet by mouth daily.    . mupirocin ointment (BACTROBAN) 2 % Apply 1 application topically daily.    . rosuvastatin (CRESTOR) 10 MG tablet Take 0.5 tablets (5 mg total) by mouth daily.    . sitaGLIPtin (JANUVIA) 100 MG tablet Take 100 mg by mouth daily.    . tamsulosin (FLOMAX) 0.4 MG CAPS capsule Take 1 capsule (0.4 mg total) by mouth 2 times daily at 12 noon and 4 pm. 60 capsule 0  . diltiazem (CARDIZEM CD) 120 MG 24 hr capsule Take 1 capsule (120 mg total) by mouth daily. 90 capsule 3   No current facility-administered medications for this visit.     Allergies:   Sulfa antibiotics    Social History:  The patient  reports that he quit smoking about 34 years ago. His smoking use included cigarettes. He started smoking about 56 years ago. He smoked 1.00 pack per day. He has never used smokeless tobacco. He reports that he drinks about 4.0 standard drinks of alcohol per week. He reports that he does not use drugs.   Family History:   The patient's family history includes Hypertension in his father.    ROS:  General:no colds or fevers, no weight changes Skin:no rashes or ulcers- lt foot wrapped HEENT:no blurred vision, no congestion CV:see HPI PUL:see HPI GI:no diarrhea constipation or melena, no indigestion GU:no hematuria, no dysuria MS:no joint pain pain at foot site last pm, no claudication Neuro:no syncope, no lightheadedness Endo:no diabetes, no thyroid disease Former tobacco use.   Wt Readings from Last 3 Encounters:  05/19/18 195 lb (88.5 kg)  05/17/18 195 lb (88.5 kg)  05/11/18 206 lb 9.1 oz (93.7 kg)     PHYSICAL EXAM: VS:  BP (!) 98/40  Pulse (!) 46   Ht 6\' 1"  (1.854 m)   Wt 195 lb (88.5 kg)   SpO2 92%   BMI 25.73 kg/m  , BMI Body mass index is 25.73 kg/m. General:Pleasant affect, NAD Skin:Warm and dry, brisk capillary refill HEENT:normocephalic, sclera clear, mucus membranes moist Neck:supple, no JVD, no bruits  Heart:S1S2 RRR without murmur, gallup, rub or click Lungs: without rales, rhonchi, + wheezes throughout WUJ:WJXB, non tender, + BS, do not palpate liver spleen or masses Ext:no lower ext edema,  2+ radial pulses, Lt foot with some edema Neuro:alert and oriented X 3, MAE, follows commands, + facial symmetry    EKG:  EKG is ordered today. The ekg ordered today demonstrates SB with PACs, HR 46.  No acute changes.   Recent Labs: 04/27/2018: NT-Pro BNP 3,814 05/06/2018: ALT 23; B Natriuretic Peptide 379.3; TSH 3.874 05/08/2018: Magnesium 1.7 05/11/2018: BUN 28; Creatinine, Ser 1.45; Hemoglobin 8.7; Platelets 362; Potassium 4.6; Sodium 132    Lipid Panel    Component Value Date/Time   CHOL 75 03/17/2018 0339   TRIG 114 03/17/2018 0339   HDL 13 (L) 03/17/2018 0339   CHOLHDL 5.8 03/17/2018 0339   VLDL 23 03/17/2018 0339   LDLCALC 39 03/17/2018 0339       Other studies Reviewed: Additional studies/ records that were reviewed today include: .  Echo Study Conclusions  -  Left ventricle: The cavity size was normal. Systolic function was   normal. The estimated ejection fraction was in the range of 60%   to 65%. Wall motion was normal; there were no regional wall   motion abnormalities. There was no evidence of elevated   ventricular filling pressure by Doppler parameters. - Left atrium: The atrium was moderately dilated.  Impressions:  - No cardiac source of emboli was indentified.  ASSESSMENT AND PLAN:  1.  PAF with DCCV on 05/05/18 maintaining SB at 22 now.  On toprol XL 200 mg daily and dilt 240 mg daily.  Will decrease the dilt to 120 mg daily.  Dr. Irish Lack saw pt with me and agreed.   2.  Chronic diastolic HF.  Today no rales, + wheezes and wheezes have been present.  Hx of tobacco use in past.  Will continue lasix 40 mg daily.  Has 02 at home but has not needed last 2 days.    3.  HTN low today see above for medication adjustment.  HH RN will call in HR and BP when she checks    4.  Transmetatarsal amputation and healing now.  Per [name] Given  Pt will follow up in 4 weeks with APP or Dr. [name].     Current medicines are reviewed with the patient today.  The patient Has no concerns regarding medicines.  The following changes have been made:  See above Labs/ tests ordered today include:see above  Disposition:   FU:  see above  Signed, Cecilie Kicks, NP  05/19/2018 1:54 PM    [facility] [place], [place], [place] Phone: [phone]; Fax: [phone]   I have examined the patient and reviewed assessment and plan and discussed with patient.  Agree with above as stated.  THe patient's BP and HR are low.  Decrease Diltiazem.  Hopefully, this will help patient's energy level and vitals.  F/u with Dr. [name].    Larae Grooms

## 2018-05-19 ENCOUNTER — Encounter: Payer: Self-pay | Admitting: Cardiology

## 2018-05-19 ENCOUNTER — Ambulatory Visit (INDEPENDENT_AMBULATORY_CARE_PROVIDER_SITE_OTHER): Payer: Medicare Other | Admitting: Cardiology

## 2018-05-19 VITALS — BP 98/40 | HR 46 | Ht 73.0 in | Wt 195.0 lb

## 2018-05-19 DIAGNOSIS — I639 Cerebral infarction, unspecified: Secondary | ICD-10-CM | POA: Diagnosis not present

## 2018-05-19 DIAGNOSIS — I10 Essential (primary) hypertension: Secondary | ICD-10-CM

## 2018-05-19 DIAGNOSIS — I5032 Chronic diastolic (congestive) heart failure: Secondary | ICD-10-CM | POA: Diagnosis not present

## 2018-05-19 DIAGNOSIS — I5033 Acute on chronic diastolic (congestive) heart failure: Secondary | ICD-10-CM | POA: Diagnosis not present

## 2018-05-19 DIAGNOSIS — Z79899 Other long term (current) drug therapy: Secondary | ICD-10-CM

## 2018-05-19 DIAGNOSIS — R001 Bradycardia, unspecified: Secondary | ICD-10-CM | POA: Diagnosis not present

## 2018-05-19 DIAGNOSIS — I480 Paroxysmal atrial fibrillation: Secondary | ICD-10-CM

## 2018-05-19 DIAGNOSIS — R0602 Shortness of breath: Secondary | ICD-10-CM

## 2018-05-19 MED ORDER — DILTIAZEM HCL ER COATED BEADS 120 MG PO CP24: 120.0000 mg | ORAL_CAPSULE | Freq: Every day | ORAL | 3 refills | Status: DC

## 2018-05-19 NOTE — Telephone Encounter (Signed)
I called and advised verbal ok for orders below and will update if needed after appt on Wednesday.

## 2018-05-19 NOTE — Progress Notes (Signed)
Pt and his wife stated [facility] comes out and vital signs a couple of times a week. I asked for the [facility] name and #. I called [facility] Carrie # [phone]. I gave her report that Cecilie Kicks, NP decreased Cardizem CD to 120 mg daily as pt's HR was 46 and BP was 98/40 in the office today. BMET was drawn today as well. Asked Morey Hummingbird would please call our office with vitals signs after her visits with the pt, advised she can ask for the Triage Nurse and let them know of the readings. I thanked Morey Hummingbird for her help in this matter. Advised HHRN pt has f/u appt with Ermalinda Barrios, PA 06/21/18.

## 2018-05-19 NOTE — Patient Instructions (Signed)
Medication Instructions:  1. DECREASE CARDIZEM CD TO 120 MG DAILY; NEW RX HAS BEEN SENT IN TODAY  Labwork: TODAY BMET   Testing/Procedures: NONE ORDERED TODAY  Follow-Up: Ermalinda Barrios, PA ON 06/21/18 @ 12:30   Any Other Special Instructions Will Be Listed Below (If Applicable).     If you need a refill on your cardiac medications before your next appointment, please call your pharmacy.

## 2018-05-20 LAB — BASIC METABOLIC PANEL
BUN/Creatinine Ratio: 16 (ref 10–24)
BUN: 23 mg/dL (ref 8–27)
CO2: 24 mmol/L (ref 20–29)
Calcium: 9.4 mg/dL (ref 8.6–10.2)
Chloride: 95 mmol/L — ABNORMAL LOW (ref 96–106)
Creatinine, Ser: 1.47 mg/dL — ABNORMAL HIGH (ref 0.76–1.27)
GFR calc Af Amer: 54 mL/min/{1.73_m2} — ABNORMAL LOW (ref >59)
GFR calc non Af Amer: 46 mL/min/{1.73_m2} — ABNORMAL LOW (ref >59)
Glucose: 278 mg/dL — ABNORMAL HIGH (ref 65–99)
Potassium: 4.4 mmol/L (ref 3.5–5.2)
Sodium: 134 mmol/L (ref 134–144)

## 2018-05-22 ENCOUNTER — Telehealth: Payer: Self-pay | Admitting: Cardiology

## 2018-05-22 DIAGNOSIS — I480 Paroxysmal atrial fibrillation: Secondary | ICD-10-CM | POA: Diagnosis not present

## 2018-05-22 DIAGNOSIS — E1140 Type 2 diabetes mellitus with diabetic neuropathy, unspecified: Secondary | ICD-10-CM | POA: Diagnosis not present

## 2018-05-22 DIAGNOSIS — E1169 Type 2 diabetes mellitus with other specified complication: Secondary | ICD-10-CM | POA: Diagnosis not present

## 2018-05-22 DIAGNOSIS — Z4781 Encounter for orthopedic aftercare following surgical amputation: Secondary | ICD-10-CM | POA: Diagnosis not present

## 2018-05-22 DIAGNOSIS — I129 Hypertensive chronic kidney disease with stage 1 through stage 4 chronic kidney disease, or unspecified chronic kidney disease: Secondary | ICD-10-CM | POA: Diagnosis not present

## 2018-05-22 DIAGNOSIS — M86272 Subacute osteomyelitis, left ankle and foot: Secondary | ICD-10-CM | POA: Diagnosis not present

## 2018-05-22 NOTE — Telephone Encounter (Signed)
New Message:   Vital Signs:  BP 116/66 (Laying)    88/46 (Standing)  110/52 (Sitting)  HR: 64

## 2018-05-23 ENCOUNTER — Encounter (INDEPENDENT_AMBULATORY_CARE_PROVIDER_SITE_OTHER): Payer: Self-pay | Admitting: Orthopedic Surgery

## 2018-05-23 ENCOUNTER — Telehealth: Payer: Self-pay | Admitting: Cardiology

## 2018-05-23 NOTE — Telephone Encounter (Signed)
New Message:   Pt daughter returning call

## 2018-05-23 NOTE — Progress Notes (Signed)
Office Visit Note   Patient: Kevin [name]           Date of Birth: [birth year]           MRN: [id number] Visit Date: 05/04/2018              Requested by: [name], MD [address] [place], [place] [address] PCP: [name], MD  Chief Complaint  Patient presents with  . Left Foot - Routine Post Op    03/24/18 left foot transmet amputation       HPI: Patient is a 77 year old gentleman who presents almost 6 weeks status post left transmetatarsal amputation.  Patient is on Eliquis and has an open wound.  Assessment & Plan: Visit Diagnoses:  1. History of transmetatarsal amputation of left foot ([place])   2. Dehiscence of amputation stump ([place])     Plan: With the exposed bone and wound dehiscence patient will need to proceed with a revision of the transmetatarsal amputation we will set this up for Friday.  Follow-Up Instructions: Return in about 2 weeks (around 05/18/2018).   Ortho Exam  Patient is alert, oriented, no adenopathy, well-dressed, normal affect, normal respiratory effort. Examination patient has wound dehiscence there is exposed bone.  There is no abscess no purulence no cellulitis.  Imaging: No results found. No images are attached to the encounter.  Labs: Lab Results  Component Value Date   HGBA1C 7.6 (H) 03/17/2018   REPTSTATUS 03/22/2018 FINAL 03/17/2018   GRAMSTAIN  03/17/2018    MODERATE WBC PRESENT, PREDOMINANTLY PMN MODERATE GRAM POSITIVE COCCI FEW GRAM NEGATIVE RODS    CULT  03/17/2018    MODERATE STREPTOCOCCUS GROUP C MODERATE PREVOTELLA MELANINOGENICA BETA LACTAMASE POSITIVE Performed at [facility], [place] [address], [place],  [address]    LABORGA STAPHYLOCOCCUS SPECIES (COAGULASE NEGATIVE) 03/17/2018     Lab Results  Component Value Date   ALBUMIN 3.2 (L) 05/06/2018   ALBUMIN 3.2 (L) 05/06/2018   ALBUMIN 2.3 (L) 03/28/2018    Body mass index is 27.97 kg/m.  Orders:  No orders of the  defined types were placed in this encounter.  No orders of the defined types were placed in this encounter.    Procedures: No procedures performed  Clinical Data: No additional findings.  ROS:  All other systems negative, except as noted in the HPI. Review of Systems  Objective: Vital Signs: Ht 6\' 1"  (1.854 m)   Wt 212 lb (96.2 kg)   BMI 27.97 kg/m   Specialty Comments:  No specialty comments available.  PMFS History: Patient Active Problem List   Diagnosis Date Noted  . Dehiscence of amputation stump (Bell[place])   . Acute CHF (congestive heart failure) ([place]) 05/07/2018  . Acute on chronic diastolic CHF (congestive heart failure) ([place])   . Acute on chronic congestive heart failure (Alt[place]) 05/06/2018  . Renal insufficiency 05/06/2018  . Type 2 diabetes mellitus with vascular disease ([place]) 05/06/2018  . Debility 03/31/2018  . Urinary retention with incomplete bladder emptying 03/31/2018  . History of transmetatarsal amputation of left foot ([place])   . 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 ([place])   . Status post transmetatarsal amputation of left foot (Otho)   . Postoperative pain   . Benign prostatic hyperplasia with urinary retention   . New onset atrial fibrillation (Knott)   . Incontinence of feces   . Cerebrovascular accident (CVA) ([place]urb[place])   . Benign essential HTN   . Diabetes mellitus type 2  in nonobese (Dante)   . Hypokalemia   . Paroxysmal atrial fibrillation (HCC)   . Leukocytosis   . SIRS (systemic inflammatory response syndrome) (HCC)   . Acute blood loss anemia   . Stage 3 chronic kidney disease (Wal[place])   . Essential hypertension 03/20/2018  . Tachycardia 03/20/2018  . Sepsis ([place]at) 03/20/2018  . Diabetes (Waller) 03/20/2018  . Subacute osteomyelitis, left ankle and foot ([place] Shannon) 03/20/2018  . A-fib ([place]akley[place]) 03/20/2018  . 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 Strain of left tibialis anterior muscle 12/19/2015  . Primary osteoarthritis of left foot  11/21/2015  . Fracture of radius, distal, left, closed 12/19/2012   Past Medical History:  Diagnosis Date  . A-fib (S[place]) 03/20/2018  . Acute blood loss anemia   . Benign essential HTN   . Benign prostatic hyperplasia with urinary retention   . Cerebrovascular accident (CVA) ([place])   . CKD (chronic kidney disease), stage III ([place]knag[place])   . Diabetes (Harrisville) 03/20/2018  . Diabetes mellitus type 2 in nonobese (HCC)   . Essential hypertension 03/20/2018  . Former tobacco use   . H/O ulcerative colitis 1993  . Hypertension   . Hypokalemia   . Leukocytosis   . New onset atrial fibrillation ([place]az[place])   . Osteomyelitis ([place] Sw[place])    a. 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  [name] (paroxysmal atrial fibrillation) ([place])    a. dx 03/2018 in setting of stroke, osteomyelitis.  . Sepsis (Hymera) 03/20/2018  . Stage 3 chronic kidney disease (Erma)    pt/family unaware  . Status post transmetatarsal amputation of left foot ([place])   . Stroke ([place] Estates) 03/2018  . Stroke-like episode (Andrews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 03/16/2018  . Tachycardia 03/20/2018  . Wrist fracture    Left    Family History  Problem Relation Age of Onset  . Hypertension Father     Past Surgical History:  Procedure Laterality Date  . AMPUTATION Left 03/24/2018   Procedure: Transmetatarsal Amputation Left Foot;  Surgeon: [name], MD;  Location: [place];  Service: Orthopedics;  Laterality: Left;  . CARDIOVERSION N/A 05/05/2018   Procedure: CARDIOVERSION;  Surgeon: [name], MD;  Location: [facility] ENDOSCOPY;  Service: Cardiovascular;  Laterality: N/A;  . COLONOSCOPY  12/2017   benign, remission from ulcerative colitis in 90s  . I&D EXTREMITY Left 03/17/2018   Procedure: IRRIGATION AND DEBRIDEMENT FOOT;  Surgeon: [name], MD;  Location: [place];  Service: Orthopedics;  Laterality: Left;  . STUMP REVISION Left 05/10/2018   Procedure: REVISION LEFT TRANSMETATARSAL AMPUTATION;  Surgeon: [name], MD;  Location: [place];  Service: Orthopedics;   Laterality: Left;   Social History   Occupational History  . Not on file  Tobacco Use  . Smoking status: Former Smoker    Packs/day: 1.00    Types: Cigarettes    Start date: 1963    Last attempt to quit: 1985    Years since quitting: 34.6  . Smokeless tobacco: Never Used  Substance and Sexual Activity  . Alcohol use: Yes    Alcohol/week: 4.0 standard drinks    Types: 4 Cans of beer per week    Comment: social   . Drug use: Never  . Sexual activity: Not on file

## 2018-05-23 NOTE — Telephone Encounter (Signed)
Spoke to patient's wife and informed her of patient's lab results/recommendations.  She verbalized understanding.

## 2018-05-23 NOTE — Telephone Encounter (Signed)
S/w pt is advised of Cecilie Kicks, NP, recommendation's, stated someone called from this office with results. Pt appreciated the call.

## 2018-05-23 NOTE — Telephone Encounter (Signed)
Improved - will continue to monitor before decreasing any other meds.  Unless you develop dizziness or lightheaededness.

## 2018-05-24 ENCOUNTER — Encounter (INDEPENDENT_AMBULATORY_CARE_PROVIDER_SITE_OTHER): Payer: Self-pay | Admitting: Orthopedic Surgery

## 2018-05-24 ENCOUNTER — Ambulatory Visit (INDEPENDENT_AMBULATORY_CARE_PROVIDER_SITE_OTHER): Payer: Medicare Other | Admitting: Physician Assistant

## 2018-05-24 DIAGNOSIS — Z89432 Acquired absence of left foot: Secondary | ICD-10-CM

## 2018-05-24 NOTE — Progress Notes (Signed)
Office Visit Note   Patient: Kevin [name]           Date of Birth: Apr 21, 1941           MRN: [id number] Visit Date: 05/24/2018              Requested by: [name], MD [address] [place], [place] [address] PCP: [name], MD   Assessment & Plan: Visit Diagnoses:  1. History of transmetatarsal amputation of left foot (HCC)     Plan: Continue elevation of the left foot and nonweightbearing in the Darco shoe.  We will plan to see him back in a week and harvest the sutures at that time.  Will wrap with gauze and Ace wrap daily to help with edema control.  Follow-Up Instructions: Return in about 1 week (around 05/31/2018).   Orders:  No orders of the defined types were placed in this encounter.  No orders of the defined types were placed in this encounter.     Procedures: No procedures performed   Clinical Data: No additional findings.   Subjective: Chief Complaint  Patient presents with  . Left Foot - Routine Post Op    03/24/18 left transmet amputation  05/10/18 revision transmet     HPI Patient is a 77 year old male who underwent a revision of his left transmetatarsal amputation on 05/10/2018.  He is 2 weeks postop.  He has had some minimal bloody drainage from the incisional line but the sutures remained intact.  He is not currently on any antibiotics.  He has been applying some Bactroban ointment to the incisional area daily with gauze and Ace wrapping to help control edema.  He has been nonweightbearing in his Darco shoe.  He is on Eliquis for anticoagulation. Review of Systems   Objective: Vital Signs: There were no vitals taken for this visit.  Physical Exam Patient presents with a wheelchair level.  He is alert oriented and appropriate.  He appears well-nourished well-developed. Ortho Exam Examination of the left transmetatarsal amputation shows intact sutures but the area still remains under some tension due to edema and we  will not remove the sutures today.  There are no signs of cellulitis with no excess erythema or warmth.  He has good pedal pulse proximally. Specialty Comments:  No specialty comments available.  Imaging: No results found.   PMFS History: Patient Active Problem List   Diagnosis Date Noted  . Dehiscence of amputation stump ([place])   . Acute CHF (congestive heart failure) ([place]) 05/07/2018  . Acute on chronic diastolic CHF (congestive heart failure) (Williamson)   . Acute on chronic congestive heart failure (Gray) 05/06/2018  . Renal insufficiency 05/06/2018  . Type 2 diabetes mellitus with vascular disease (Pollock Pines) 05/06/2018  . Debility 03/31/2018  . Urinary retention with incomplete bladder emptying 03/31/2018  . History of transmetatarsal amputation of left foot ([place]aks)   . 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 ([place] Allen)   . Status post transmetatarsal amputation of left foot (Shannon)   . Postoperative pain   . Benign prostatic hyperplasia with urinary retention   . New onset atrial fibrillation ([place]hampton)   . Incontinence of feces   . Cerebrovascular accident (CVA) (Carsonville)   . Benign essential HTN   . Diabetes mellitus type 2 in nonobese (HCC)   . Hypokalemia   . Paroxysmal atrial fibrillation (HCC)   . Leukocytosis   . SIRS (systemic inflammatory response syndrome) (HCC)   . Acute blood loss anemia   .  Stage 3 chronic kidney disease (Tanana)   . Essential hypertension 03/20/2018  . Tachycardia 03/20/2018  . Sepsis ([place]) 03/20/2018  . Diabetes ([place]) 03/20/2018  . Subacute osteomyelitis, left ankle and foot ([place]) 03/20/2018  . A-fib ([place]town) 03/20/2018  . Stroke-like episode (Jen[place]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 03/16/2018  . Strain of left tibialis anterior muscle 12/19/2015  . 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 Fracture of radius, distal, left, closed 12/19/2012   Past Medical History:  Diagnosis Date  . A-fib (Pottsboro) 03/20/2018  . Acute blood loss anemia   . Benign essential HTN   . Benign  prostatic hyperplasia with urinary retention   . Cerebrovascular accident (CVA) (Ho[place])   . CKD (chronic kidney disease), stage III (Matag[place])   . Diabetes (Hoboken) 03/20/2018  . Diabetes mellitus type 2 in nonobese (HCC)   . Essential hypertension 03/20/2018  . Former tobacco use   . H/O ulcerative colitis 1993  . Hypertension   . Hypokalemia   . Leukocytosis   . New onset atrial fibrillation (Bennett)   . Osteomyelitis ([place] T[place])    a. 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  [name] (paroxysmal atrial fibrillation) (Hueytown)    a. dx 03/2018 in setting of stroke, osteomyelitis.  . Sepsis (Oakwood) 03/20/2018  . Stage 3 chronic kidney disease (Hartsville)    pt/family unaware  . Status post transmetatarsal amputation of left foot (Helotes)   . Stroke (Enville) 03/2018  . Stroke-like episode (Coal Creek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 03/16/2018  . Tachycardia 03/20/2018  . Wrist fracture    Left    Family History  Problem Relation Age of Onset  . Hypertension Father     Past Surgical History:  Procedure Laterality Date  . AMPUTATION Left 03/24/2018   Procedure: Transmetatarsal Amputation Left Foot;  Surgeon: [name], MD;  Location: [place];  Service: Orthopedics;  Laterality: Left;  . CARDIOVERSION N/A 05/05/2018   Procedure: CARDIOVERSION;  Surgeon: [name], MD;  [facility]COPY;  Service: Cardiovascular;  Laterality: N/A;  . COLONOSCOPY  12/2017   benign, remission from ulcerative colitis in 90s  . I&D EXTREMITY Left 03/17/2018   Procedure: IRRIGATION AND DEBRIDEMENT FOOT;  Surgeon: [name], MD;  Location: [place];  Service: Orthopedics;  Laterality: Left;  . STUMP REVISION Left 05/10/2018   Procedure: REVISION LEFT TRANSMETATARSAL AMPUTATION;  Surgeon: [name], MD;  Location: [place];  Service: Orthopedics;  Laterality: Left;   Social History   Occupational History  . Not on file  Tobacco Use  . Smoking status: Former Smoker    Packs/day: 1.00    Types: Cigarettes    Start date: 1963    Last attempt to  quit: 1985    Years since quitting: 34.6  . Smokeless tobacco: Never Used  Substance and Sexual Activity  . Alcohol use: Yes    Alcohol/week: 4.0 standard drinks    Types: 4 Cans of beer per week    Comment: social   . Drug use: Never  . Sexual activity: Not on file

## 2018-05-25 DIAGNOSIS — E1169 Type 2 diabetes mellitus with other specified complication: Secondary | ICD-10-CM | POA: Diagnosis not present

## 2018-05-25 DIAGNOSIS — I129 Hypertensive chronic kidney disease with stage 1 through stage 4 chronic kidney disease, or unspecified chronic kidney disease: Secondary | ICD-10-CM | POA: Diagnosis not present

## 2018-05-25 DIAGNOSIS — I480 Paroxysmal atrial fibrillation: Secondary | ICD-10-CM | POA: Diagnosis not present

## 2018-05-25 DIAGNOSIS — E1140 Type 2 diabetes mellitus with diabetic neuropathy, unspecified: Secondary | ICD-10-CM | POA: Diagnosis not present

## 2018-05-25 DIAGNOSIS — M86272 Subacute osteomyelitis, left ankle and foot: Secondary | ICD-10-CM | POA: Diagnosis not present

## 2018-05-25 DIAGNOSIS — Z4781 Encounter for orthopedic aftercare following surgical amputation: Secondary | ICD-10-CM | POA: Diagnosis not present

## 2018-05-31 ENCOUNTER — Encounter (INDEPENDENT_AMBULATORY_CARE_PROVIDER_SITE_OTHER): Payer: Self-pay | Admitting: Physician Assistant

## 2018-05-31 ENCOUNTER — Ambulatory Visit (INDEPENDENT_AMBULATORY_CARE_PROVIDER_SITE_OTHER): Payer: Medicare Other | Admitting: Physician Assistant

## 2018-05-31 VITALS — Ht 73.0 in | Wt 195.0 lb

## 2018-05-31 DIAGNOSIS — Z89432 Acquired absence of left foot: Secondary | ICD-10-CM | POA: Diagnosis not present

## 2018-05-31 NOTE — Progress Notes (Signed)
Office Visit Note   Patient: Kevin [name]           Date of Birth: 10/13/1940           MRN: [id number] Visit Date: 05/31/2018              Requested by: [name], MD [address] [name], [name] [address] PCP: [name], MD  Chief Complaint  Patient presents with  . Left Foot - Follow-up      HPI: Patient is a 77 year old male who underwent revision of his left metatarsal amputation on 05/10/2018.  He is now approximately 3 weeks postop.  He is remained nonweightbearing over the left foot.  Sutures are intact.  He has not had significant drainage.  He is cleaning the area daily and rewrapping.  He is elevating the foot as much as possible.  Assessment & Plan: Visit Diagnoses: No diagnosis found.  Plan: Patient instructed to continue nonweightbearing in his postoperative shoe and continue to wrap daily.  He will also apply some Iodosorb ointment to the slight dehiscence of the incisional site following cleaning.  He is going continue continue with some protein supplementation daily.  We will see him next week for suture removal and wound check.  Follow-Up Instructions: No follow-ups on file.   Ortho Exam  Patient is alert, oriented, no adenopathy, well-dressed, normal affect, normal respiratory effort. Examination of the left transmetatarsal amputation shows still some mild edema of the residual foot with some slight dehiscence of the central wound.  There are no signs of cellulitis.  Sutures are intact but under tension were to leave those in under additional week.  Imaging: No results found. No images are attached to the encounter.  Labs: Lab Results  Component Value Date   HGBA1C 7.6 (H) 03/17/2018   REPTSTATUS 03/22/2018 FINAL 03/17/2018   GRAMSTAIN  03/17/2018    MODERATE WBC PRESENT, PREDOMINANTLY PMN MODERATE GRAM POSITIVE COCCI FEW GRAM NEGATIVE RODS    CULT  03/17/2018    MODERATE STREPTOCOCCUS GROUP C MODERATE PREVOTELLA  MELANINOGENICA BETA LACTAMASE POSITIVE Performed at [facility], [place] [address] [place], [place] [address]    LABORGA STAPHYLOCOCCUS SPECIES (COAGULASE NEGATIVE) 03/17/2018     Lab Results  Component Value Date   ALBUMIN 3.2 (L) 05/06/2018   ALBUMIN 3.2 (L) 05/06/2018   ALBUMIN 2.3 (L) 03/28/2018    Body mass index is 25.73 kg/m.  Orders:  No orders of the defined types were placed in this encounter.  No orders of the defined types were placed in this encounter.    Procedures: No procedures performed  Clinical Data: No additional findings.  ROS:  All other systems negative, except as noted in the HPI. Review of Systems  Objective: Vital Signs: Ht 6\' 1"  (1.854 m)   Wt 195 lb (88.5 kg)   BMI 25.73 kg/m   Specialty Comments:  No specialty comments available.  PMFS History: Patient Active Problem List   Diagnosis Date Noted  . Dehiscence of amputation stump (Eldridge)   . Acute CHF (congestive heart failure) (Ramona) 05/07/2018  . Acute on chronic diastolic CHF (congestive heart failure) (Royal)   . Acute on chronic congestive heart failure (Wadsworth) 05/06/2018  . Renal insufficiency 05/06/2018  . Type 2 diabetes mellitus with vascular disease (Plover) 05/06/2018  . Debility 03/31/2018  . Urinary retention with incomplete bladder emptying 03/31/2018  . History of transmetatarsal amputation of left foot (Morrilton)   . Hypoalbuminemia due to  protein-calorie malnutrition ([place]town)   . Status post transmetatarsal amputation of left foot ([place])   . Postoperative pain   . Benign prostatic hyperplasia with urinary retention   . New onset atrial fibrillation (Bith[place])   . Incontinence of feces   . Cerebrovascular accident (CVA) (Pelican Rapids)   . Benign essential HTN   . Diabetes mellitus type 2 in nonobese (HCC)   . Hypokalemia   . Paroxysmal atrial fibrillation (HCC)   . Leukocytosis   . SIRS (systemic inflammatory response syndrome) (HCC)   . Acute blood loss anemia   . Stage 3 chronic  kidney disease (Mauldin)   . Essential hypertension 03/20/2018  . Tachycardia 03/20/2018  . Sepsis (Van Voorhis) 03/20/2018  . Diabetes ([place] Haven) 03/20/2018  . Subacute osteomyelitis, left ankle and foot (Eagle) 03/20/2018  . A-fib (Stockton) 03/20/2018  . Stroke-like episode (Rich[place]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 03/16/2018  . Strain of left tibialis anterior muscle 12/19/2015  . 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 Fracture of radius, distal, left, closed 12/19/2012   Past Medical History:  Diagnosis Date  . A-fib (Pomeroy) 03/20/2018  . Acute blood loss anemia   . Benign essential HTN   . Benign prostatic hyperplasia with urinary retention   . Cerebrovascular accident (CVA) (Indiahoma)   . CKD (chronic kidney disease), stage III (Kasson)   . Diabetes ([place]knag[place]) 03/20/2018  . Diabetes mellitus type 2 in nonobese (HCC)   . Essential hypertension 03/20/2018  . Former tobacco use   . H/O ulcerative colitis 1993  . Hypertension   . Hypokalemia   . Leukocytosis   . New onset atrial fibrillation (McNeal)   . Osteomyelitis (C[place]ins Island)    a. 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  [name] (paroxysmal atrial fibrillation) (Healy)    a. dx 03/2018 in setting of stroke, osteomyelitis.  . Sepsis (Sil[place]) 03/20/2018  . Stage 3 chronic kidney disease (Friendly)    pt/family unaware  . Status post transmetatarsal amputation of left foot (Reinerton)   . Stroke ([place]age Lakes) 03/2018  . Stroke-like episode (Cor[place]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 03/16/2018  . Tachycardia 03/20/2018  . Wrist fracture    Left    Family History  Problem Relation Age of Onset  . Hypertension Father     Past Surgical History:  Procedure Laterality Date  . AMPUTATION Left 03/24/2018   Procedure: Transmetatarsal Amputation Left Foot;  Surgeon: [name], MD;  Location: [place];  Service: Orthopedics;  Laterality: Left;  . CARDIOVERSION N/A 05/05/2018   Procedure: CARDIOVERSION;  Surgeon: [name], MD;  Location: [facility] ENDOSCOPY;  Service: Cardiovascular;  Laterality: N/A;  . COLONOSCOPY  12/2017   benign,  remission from ulcerative colitis in 90s  . I&D EXTREMITY Left 03/17/2018   Procedure: IRRIGATION AND DEBRIDEMENT FOOT;  Surgeon: [name], MD;  Location: [place];  Service: Orthopedics;  Laterality: Left;  . STUMP REVISION Left 05/10/2018   Procedure: REVISION LEFT TRANSMETATARSAL AMPUTATION;  Surgeon: [name], MD;  Location: [place];  Service: Orthopedics;  Laterality: Left;   Social History   Occupational History  . Not on file  Tobacco Use  . Smoking status: Former Smoker    Packs/day: 1.00    Types: Cigarettes    Start date: 1963    Last attempt to quit: 1985    Years since quitting: 34.6  . Smokeless tobacco: Never Used  Substance and Sexual Activity  . Alcohol use: Yes    Alcohol/week: 4.0 standard drinks    Types: 4 Cans of beer  per week    Comment: social   . Drug use: Never  . Sexual activity: Not on file

## 2018-06-01 ENCOUNTER — Encounter (INDEPENDENT_AMBULATORY_CARE_PROVIDER_SITE_OTHER): Payer: Self-pay | Admitting: Physician Assistant

## 2018-06-01 DIAGNOSIS — E1140 Type 2 diabetes mellitus with diabetic neuropathy, unspecified: Secondary | ICD-10-CM | POA: Diagnosis not present

## 2018-06-01 DIAGNOSIS — I480 Paroxysmal atrial fibrillation: Secondary | ICD-10-CM | POA: Diagnosis not present

## 2018-06-01 DIAGNOSIS — Z4781 Encounter for orthopedic aftercare following surgical amputation: Secondary | ICD-10-CM | POA: Diagnosis not present

## 2018-06-01 DIAGNOSIS — M86272 Subacute osteomyelitis, left ankle and foot: Secondary | ICD-10-CM | POA: Diagnosis not present

## 2018-06-01 DIAGNOSIS — I129 Hypertensive chronic kidney disease with stage 1 through stage 4 chronic kidney disease, or unspecified chronic kidney disease: Secondary | ICD-10-CM | POA: Diagnosis not present

## 2018-06-01 DIAGNOSIS — E1169 Type 2 diabetes mellitus with other specified complication: Secondary | ICD-10-CM | POA: Diagnosis not present

## 2018-06-02 DIAGNOSIS — I5033 Acute on chronic diastolic (congestive) heart failure: Secondary | ICD-10-CM | POA: Diagnosis not present

## 2018-06-02 DIAGNOSIS — Z7901 Long term (current) use of anticoagulants: Secondary | ICD-10-CM | POA: Diagnosis not present

## 2018-06-02 DIAGNOSIS — Z89432 Acquired absence of left foot: Secondary | ICD-10-CM | POA: Diagnosis not present

## 2018-06-02 DIAGNOSIS — Z7984 Long term (current) use of oral hypoglycemic drugs: Secondary | ICD-10-CM | POA: Diagnosis not present

## 2018-06-02 DIAGNOSIS — I130 Hypertensive heart and chronic kidney disease with heart failure and stage 1 through stage 4 chronic kidney disease, or unspecified chronic kidney disease: Secondary | ICD-10-CM | POA: Diagnosis not present

## 2018-06-02 DIAGNOSIS — E1140 Type 2 diabetes mellitus with diabetic neuropathy, unspecified: Secondary | ICD-10-CM | POA: Diagnosis not present

## 2018-06-02 DIAGNOSIS — N401 Enlarged prostate with lower urinary tract symptoms: Secondary | ICD-10-CM | POA: Diagnosis not present

## 2018-06-02 DIAGNOSIS — E1159 Type 2 diabetes mellitus with other circulatory complications: Secondary | ICD-10-CM | POA: Diagnosis not present

## 2018-06-02 DIAGNOSIS — M86272 Subacute osteomyelitis, left ankle and foot: Secondary | ICD-10-CM | POA: Diagnosis not present

## 2018-06-02 DIAGNOSIS — K5190 Ulcerative colitis, unspecified, without complications: Secondary | ICD-10-CM | POA: Diagnosis not present

## 2018-06-02 DIAGNOSIS — Z792 Long term (current) use of antibiotics: Secondary | ICD-10-CM | POA: Diagnosis not present

## 2018-06-02 DIAGNOSIS — T8781 Dehiscence of amputation stump: Secondary | ICD-10-CM | POA: Diagnosis not present

## 2018-06-02 DIAGNOSIS — E1169 Type 2 diabetes mellitus with other specified complication: Secondary | ICD-10-CM | POA: Diagnosis not present

## 2018-06-02 DIAGNOSIS — Z8673 Personal history of transient ischemic attack (TIA), and cerebral infarction without residual deficits: Secondary | ICD-10-CM | POA: Diagnosis not present

## 2018-06-02 DIAGNOSIS — E1122 Type 2 diabetes mellitus with diabetic chronic kidney disease: Secondary | ICD-10-CM | POA: Diagnosis not present

## 2018-06-02 DIAGNOSIS — I481 Persistent atrial fibrillation: Secondary | ICD-10-CM | POA: Diagnosis not present

## 2018-06-02 DIAGNOSIS — N183 Chronic kidney disease, stage 3 (moderate): Secondary | ICD-10-CM | POA: Diagnosis not present

## 2018-06-05 DIAGNOSIS — E871 Hypo-osmolality and hyponatremia: Secondary | ICD-10-CM | POA: Diagnosis not present

## 2018-06-05 DIAGNOSIS — D649 Anemia, unspecified: Secondary | ICD-10-CM | POA: Diagnosis not present

## 2018-06-08 ENCOUNTER — Encounter: Payer: Self-pay | Admitting: Physician Assistant

## 2018-06-08 ENCOUNTER — Encounter: Payer: Medicare Other | Admitting: Physical Medicine & Rehabilitation

## 2018-06-09 DIAGNOSIS — I130 Hypertensive heart and chronic kidney disease with heart failure and stage 1 through stage 4 chronic kidney disease, or unspecified chronic kidney disease: Secondary | ICD-10-CM | POA: Diagnosis not present

## 2018-06-09 DIAGNOSIS — I481 Persistent atrial fibrillation: Secondary | ICD-10-CM | POA: Diagnosis not present

## 2018-06-09 DIAGNOSIS — E1140 Type 2 diabetes mellitus with diabetic neuropathy, unspecified: Secondary | ICD-10-CM | POA: Diagnosis not present

## 2018-06-09 DIAGNOSIS — M86272 Subacute osteomyelitis, left ankle and foot: Secondary | ICD-10-CM | POA: Diagnosis not present

## 2018-06-09 DIAGNOSIS — T8781 Dehiscence of amputation stump: Secondary | ICD-10-CM | POA: Diagnosis not present

## 2018-06-09 DIAGNOSIS — E1169 Type 2 diabetes mellitus with other specified complication: Secondary | ICD-10-CM | POA: Diagnosis not present

## 2018-06-14 ENCOUNTER — Encounter (INDEPENDENT_AMBULATORY_CARE_PROVIDER_SITE_OTHER): Payer: Self-pay | Admitting: Physician Assistant

## 2018-06-14 ENCOUNTER — Ambulatory Visit (INDEPENDENT_AMBULATORY_CARE_PROVIDER_SITE_OTHER): Payer: Medicare Other | Admitting: Physician Assistant

## 2018-06-14 VITALS — Ht 73.0 in | Wt 195.0 lb

## 2018-06-14 DIAGNOSIS — N1330 Unspecified hydronephrosis: Secondary | ICD-10-CM | POA: Diagnosis not present

## 2018-06-14 DIAGNOSIS — E1159 Type 2 diabetes mellitus with other circulatory complications: Secondary | ICD-10-CM

## 2018-06-14 DIAGNOSIS — N132 Hydronephrosis with renal and ureteral calculous obstruction: Secondary | ICD-10-CM | POA: Diagnosis not present

## 2018-06-14 DIAGNOSIS — N130 Hydronephrosis with ureteropelvic junction obstruction: Secondary | ICD-10-CM | POA: Diagnosis not present

## 2018-06-14 DIAGNOSIS — Z89432 Acquired absence of left foot: Secondary | ICD-10-CM

## 2018-06-14 NOTE — Progress Notes (Signed)
Office Visit Note   Patient: Kevin [name]           Date of Birth: 1940/10/11           MRN: [id number] Visit Date: 06/14/2018              Requested by: Kevin Arabian, MD [address] [place], [place] [address] PCP: Kevin Arabian, MD  Chief Complaint  Patient presents with  . Left Foot - Follow-up      HPI: Patient is a 77 year old male who is seen for postoperative follow-up following left transmetatarsal amputation on 05/10/2018.  He comes in today with his wife who is a retired Therapist, sports.  They have been applying some antibacterial cream to the incisional line and keeping the extremity elevated as much as possible.  Over the past couple of dressing changes they have started utilizing the silver sock.  The patient is much improved with regards to his edema and very pleased with his progress over the past week.  He is continuing to take daily protein supplementation.  Assessment & Plan: Visit Diagnoses:  1. History of transmetatarsal amputation of left foot ([place])   2. Type 2 diabetes mellitus with vascular disease ([place])     Plan: They will continue applying the silver sock for compression.  Continue to elevate the left lower extremity as much as possible.  They were given a prescription to be seen at [facility] clinic for custom shoe orthotic and spacer.  Once he obtains his shoe, orthotic and spacer he may begin with progressive ambulation to tolerance.  He can weight-bear as tolerated once he obtains a shoe.  They will follow-up in 4 weeks or sooner should they have any difficulties in the interim.  Follow-Up Instructions: Return in about 4 weeks (around 07/12/2018).   Ortho Exam  Patient is alert, oriented, no adenopathy, well-dressed, normal affect, normal respiratory effort. The left foot transmetatarsal amputation is much improved.  There is trace edema.  There is no erythema.  There is some minimal residual crusting about the incision line centrally but  otherwise it appears well-healed.  No signs of cellulitis.  Imaging: No results found. No images are attached to the encounter.  Labs: [name] Results  Component Value Date   HGBA1C 7.6 (H) 03/17/2018   REPTSTATUS 03/22/2018 FINAL 03/17/2018   GRAMSTAIN  03/17/2018    MODERATE WBC PRESENT, PREDOMINANTLY PMN MODERATE GRAM POSITIVE COCCI FEW GRAM NEGATIVE RODS    CULT  03/17/2018    MODERATE STREPTOCOCCUS GROUP C MODERATE PREVOTELLA MELANINOGENICA BETA LACTAMASE POSITIVE Performed at Kevin [name], Kevin [name] [address], [place], [place] [address]    LABORGA STAPHYLOCOCCUS SPECIES (COAGULASE NEGATIVE) 03/17/2018     [name] Results  Component Value Date   ALBUMIN 3.2 (L) 05/06/2018   ALBUMIN 3.2 (L) 05/06/2018   ALBUMIN 2.3 (L) 03/28/2018    Body mass index is 25.73 kg/m.  Orders:  No orders of the defined types were placed in this encounter.  No orders of the defined types were placed in this encounter.    Procedures: No procedures performed  Clinical Data: No additional findings.  ROS:  All other systems negative, except as noted in the HPI. Review of Systems  Objective: Vital Signs: Ht 6\' 1"  (1.854 m)   Wt 195 lb (88.5 kg)   BMI 25.73 kg/m   Specialty Comments:  No specialty comments available.  PMFS History: Patient Active Problem List   Diagnosis Date Noted  . Dehiscence  of amputation stump (Kevin [name])   . Acute CHF (congestive heart failure) (Kevin [name]) 05/07/2018  . Acute on chronic diastolic CHF (congestive heart failure) (Kevin [name])   . Acute on chronic congestive heart failure (Kevin [name]) 05/06/2018  . Renal insufficiency 05/06/2018  . Type 2 diabetes mellitus with vascular disease ([place]) 05/06/2018  . Debility 03/31/2018  . Urinary retention with incomplete bladder emptying 03/31/2018  . History of transmetatarsal amputation of left foot ([place])   . 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 (Kevin [name])   . Status post transmetatarsal amputation of left foot (Kevin [name])     . Postoperative pain   . Benign prostatic hyperplasia with urinary retention   . Kevin onset atrial fibrillation (Kevin [name])   . Incontinence of feces   . Cerebrovascular accident (CVA) (Kevin [name])   . Benign essential HTN   . Diabetes mellitus type 2 in nonobese (HCC)   . Hypokalemia   . Paroxysmal atrial fibrillation (HCC)   . Leukocytosis   . SIRS (systemic inflammatory response syndrome) (HCC)   . Acute blood loss anemia   . Stage 3 chronic kidney disease ([place])   . Essential hypertension 03/20/2018  . Tachycardia 03/20/2018  . Sepsis (Kevin [name]) 03/20/2018  . Diabetes (Kevin [name]) 03/20/2018  . Subacute osteomyelitis, left ankle and foot (Kevin [name]) 03/20/2018  . A-fib (Kevin [name]) 03/20/2018  . Stroke-like episode (Kevin [name]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 03/16/2018  . Strain of left tibialis anterior muscle 12/19/2015  . 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 Fracture of radius, distal, left, closed 12/19/2012   Past Medical History:  Diagnosis Date  . A-fib ([place]) 03/20/2018  . Acute blood loss anemia   . Benign essential HTN   . Benign prostatic hyperplasia with urinary retention   . Cerebrovascular accident (CVA) (Kevin [name])   . CKD (chronic kidney disease), stage III (Kevin [name])   . Diabetes (Kevin [name]) 03/20/2018  . Diabetes mellitus type 2 in nonobese (HCC)   . Essential hypertension 03/20/2018  . Former tobacco use   . H/O ulcerative colitis 1993  . Hypertension   . Hypokalemia   . Leukocytosis   . Kevin onset atrial fibrillation (Kevin [name])   . Osteomyelitis (Kevin [name])    a. 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  [name] (paroxysmal atrial fibrillation) (Kevin [name])    a. dx 03/2018 in setting of stroke, osteomyelitis.  . Sepsis (Kevin [name]) 03/20/2018  . Stage 3 chronic kidney disease (Kevin [name])    pt/family unaware  . Status post transmetatarsal amputation of left foot (Kevin [name])   . Stroke (Kevin [name]) 03/2018  . Stroke-like episode (Kevin [name]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 03/16/2018  . Tachycardia 03/20/2018  . Wrist fracture    Left    Family History  Problem Relation Age of Onset  .  Hypertension Father     Past Surgical History:  Procedure Laterality Date  . AMPUTATION Left 03/24/2018   Procedure: Transmetatarsal Amputation Left Foot;  Surgeon: [name], MD;  Location: [facility];  Service: Orthopedics;  Laterality: Left;  . CARDIOVERSION N/A 05/05/2018   Procedure: CARDIOVERSION;  Surgeon: [name], MD;  Location: [facility] ENDOSCOPY;  Service: Cardiovascular;  Laterality: N/A;  . COLONOSCOPY  12/2017   benign, remission from ulcerative colitis in 90s  . I&D EXTREMITY Left 03/17/2018   Procedure: IRRIGATION AND DEBRIDEMENT FOOT;  Surgeon: [name], MD;  Location: [facility];  Service: Orthopedics;  Laterality: Left;  . STUMP REVISION Left 05/10/2018   Procedure: REVISION LEFT TRANSMETATARSAL AMPUTATION;  Surgeon: [name], MD;  Location: [facility];  Service: Orthopedics;  Laterality: Left;  Social History   Occupational History  . Not on file  Tobacco Use  . Smoking status: Former Smoker    Packs/day: 1.00    Types: Cigarettes    Start date: 1963    Last attempt to quit: 1985    Years since quitting: 34.7  . Smokeless tobacco: Never Used  Substance and Sexual Activity  . Alcohol use: Yes    Alcohol/week: 4.0 standard drinks    Types: 4 Cans of beer per week    Comment: social   . Drug use: Never  . Sexual activity: Not on file

## 2018-06-15 DIAGNOSIS — E1140 Type 2 diabetes mellitus with diabetic neuropathy, unspecified: Secondary | ICD-10-CM | POA: Diagnosis not present

## 2018-06-15 DIAGNOSIS — I481 Persistent atrial fibrillation: Secondary | ICD-10-CM | POA: Diagnosis not present

## 2018-06-15 DIAGNOSIS — I130 Hypertensive heart and chronic kidney disease with heart failure and stage 1 through stage 4 chronic kidney disease, or unspecified chronic kidney disease: Secondary | ICD-10-CM | POA: Diagnosis not present

## 2018-06-15 DIAGNOSIS — T8781 Dehiscence of amputation stump: Secondary | ICD-10-CM | POA: Diagnosis not present

## 2018-06-15 DIAGNOSIS — E1169 Type 2 diabetes mellitus with other specified complication: Secondary | ICD-10-CM | POA: Diagnosis not present

## 2018-06-15 DIAGNOSIS — M86272 Subacute osteomyelitis, left ankle and foot: Secondary | ICD-10-CM | POA: Diagnosis not present

## 2018-06-21 ENCOUNTER — Ambulatory Visit (INDEPENDENT_AMBULATORY_CARE_PROVIDER_SITE_OTHER): Payer: Medicare Other | Admitting: Physician Assistant

## 2018-06-21 ENCOUNTER — Encounter: Payer: Self-pay | Admitting: Physician Assistant

## 2018-06-21 VITALS — BP 122/68 | HR 76 | Ht 73.0 in | Wt 213.0 lb

## 2018-06-21 DIAGNOSIS — I480 Paroxysmal atrial fibrillation: Secondary | ICD-10-CM | POA: Diagnosis not present

## 2018-06-21 DIAGNOSIS — D649 Anemia, unspecified: Secondary | ICD-10-CM

## 2018-06-21 DIAGNOSIS — I639 Cerebral infarction, unspecified: Secondary | ICD-10-CM | POA: Diagnosis not present

## 2018-06-21 DIAGNOSIS — I10 Essential (primary) hypertension: Secondary | ICD-10-CM

## 2018-06-21 DIAGNOSIS — N183 Chronic kidney disease, stage 3 unspecified: Secondary | ICD-10-CM

## 2018-06-21 DIAGNOSIS — I514 Myocarditis, unspecified: Secondary | ICD-10-CM

## 2018-06-21 DIAGNOSIS — I5033 Acute on chronic diastolic (congestive) heart failure: Secondary | ICD-10-CM

## 2018-06-21 DIAGNOSIS — R2990 Unspecified symptoms and signs involving the nervous system: Secondary | ICD-10-CM

## 2018-06-21 NOTE — Patient Instructions (Addendum)
  Medication Instructions:  Your physician recommends that you continue on your current medications as directed. Please refer to the Current Medication list given to you today.   Labwork: TODAY:  BMET, CBC, SERUM IRON, & TIBC  Testing/Procedures: None ordered  Follow-Up: Your physician recommends that you schedule a follow-up appointment in: [place] DR. [name]   Any Other Special Instructions Will Be Listed Below (If Applicable).     If you need a refill on your cardiac medications before your next appointment, please call your pharmacy.

## 2018-06-21 NOTE — Progress Notes (Signed)
Cardiology Office Note    Date:  06/21/2018   ID:  Kevin, [name] [birth year], MRN [id number]  PCP:  [name], MD  Cardiologist: Lauree Chandler, MD EPS: None  No chief complaint on file.   History of Present Illness:  Kevin [name] is a 77 y.o. male with history of hypertension, DM, CVA, persistent atrial fibrillation on Eliquis for CHA2DS2-VASc score of 5, CKD stage III, chronic diastolic CHF, RBBB.  Patient had a CVA treated with TPA 02/2018 and was found to have new onset atrial fibrillation status post DCCV 05/05/2018.  2D echo 03/17/2018 normal LVEF 60 to 65% with moderate left atrial enlargement.  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    Patient's last saw Cecilie Kicks, NP 05/19/2018 at which time he was in sinus bradycardia at 46 bpm on Toprol-XL 200 mg daily and diltiazem 240 mg daily.  Dr. Irish Lack also saw the patient and they decreased his diltiazem to 120 mg daily.  Patient comes in accompanied by his wife.  He is feeling much better.  No further edema or shortness of breath.  Heart rate has come up nicely on lower dose diltiazem.  No cardiac complaints today.   Past Medical History:  Diagnosis Date  . A-fib ([place]) 03/20/2018  . Acute blood loss anemia   . Benign essential HTN   . Benign prostatic hyperplasia with urinary retention   . Cerebrovascular accident (CVA) ([place])   . CKD (chronic kidney disease), stage III ([place])   . Diabetes ([place]) 03/20/2018  . Diabetes mellitus type 2 in nonobese (HCC)   . Essential hypertension 03/20/2018  . Former tobacco use   . H/O ulcerative colitis 1993  . Hypertension   . Hypokalemia   . Leukocytosis   . New onset atrial fibrillation ([place])   . Osteomyelitis ([place])    a. 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  [name] (paroxysmal atrial fibrillation) ([place])    a. dx 03/2018 in setting of stroke, osteomyelitis.  . Sepsis ([place]) 03/20/2018  . Stage 3 chronic kidney  disease ([place])    pt/family unaware  . Status post transmetatarsal amputation of left foot ([place])   . Stroke ([place]) 03/2018  . Stroke-like episode ([place]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 03/16/2018  . Tachycardia 03/20/2018  . Wrist fracture    Left    Past Surgical History:  Procedure Laterality Date  . AMPUTATION Left 03/24/2018   Procedure: Transmetatarsal Amputation Left Foot;  Surgeon: [name], MD;  Location: [facility] [place];  Service: Orthopedics;  Laterality: Left;  . CARDIOVERSION N/A 05/05/2018   Procedure: CARDIOVERSION;  Surgeon: [name], MD;  Location: [facility] ENDOSCOPY;  Service: Cardiovascular;  Laterality: N/A;  . COLONOSCOPY  12/2017   benign, remission from ulcerative colitis in 90s  . I&D EXTREMITY Left 03/17/2018   Procedure: IRRIGATION AND DEBRIDEMENT FOOT;  Surgeon: [name], MD;  Location: [facility];  Service: Orthopedics;  Laterality: Left;  . STUMP REVISION Left 05/10/2018   Procedure: REVISION LEFT TRANSMETATARSAL AMPUTATION;  Surgeon: [name], MD;  Location: [facility];  Service: Orthopedics;  Laterality: Left;    Current Medications: Current Meds  Medication Sig  . apixaban (ELIQUIS) 5 MG TABS tablet Take 1 tablet (5 mg total) by mouth 2 (two) times daily.  Marland Kitchen diltiazem (CARDIZEM CD) 120 MG 24 hr capsule Take 1 capsule (120 mg total) by mouth daily.  Marland Kitchen FLUoxetine (PROZAC) 20 MG capsule Take 20 mg by mouth daily.  Marland Kitchen  furosemide (LASIX) 20 MG tablet Take 2 tablets (40 mg total) by mouth daily.  Marland Kitchen glimepiride (AMARYL) 4 MG tablet Take 4 mg by mouth daily with breakfast.  . mesalamine (APRISO) 0.375 g 24 hr capsule Take 750 mg by mouth 2 (two) times daily.   . metFORMIN (GLUCOPHAGE) 500 MG tablet Take 1,000 mg by mouth 2 (two) times daily with a meal.   . metoprolol (TOPROL-XL) 200 MG 24 hr tablet Take 1 tablet (200 mg total) by mouth daily. Take with or immediately following a meal.  . Multiple Vitamin (MULTIVITAMIN WITH MINERALS) TABS tablet Take 1 tablet by mouth daily.  .  rosuvastatin (CRESTOR) 10 MG tablet Take 0.5 tablets (5 mg total) by mouth daily.  . sitaGLIPtin (JANUVIA) 100 MG tablet Take 100 mg by mouth daily.  . tamsulosin (FLOMAX) 0.4 MG CAPS capsule Take 1 capsule (0.4 mg total) by mouth 2 times daily at 12 noon and 4 pm.     Allergies:   Sulfa antibiotics   Social History   Socioeconomic History  . Marital status: Married    Spouse name: Not on file  . Number of children: Not on file  . Years of education: Not on file  . Highest education level: Bachelor's degree (e.g., BA, AB, BS)  Occupational History  . Not on file  Social Needs  . Financial resource strain: Not on file  . Food insecurity:    Worry: Not on file    Inability: Not on file  . Transportation needs:    Medical: Not on file    Non-medical: Not on file  Tobacco Use  . Smoking status: Former Smoker    Packs/day: 1.00    Types: Cigarettes    Start date: 1963    Last attempt to quit: 1985    Years since quitting: 34.7  . Smokeless tobacco: Never Used  Substance and Sexual Activity  . Alcohol use: Yes    Alcohol/week: 4.0 standard drinks    Types: 4 Cans of beer per week    Comment: social   . Drug use: Never  . Sexual activity: Not on file  Lifestyle  . Physical activity:    Days per week: Not on file    Minutes per session: Not on file  . Stress: Not on file  Relationships  . Social connections:    Talks on phone: Not on file    Gets together: Not on file    Attends religious service: Not on file    Active member of club or organization: Not on file    Attends meetings of clubs or organizations: Not on file    Relationship status: Not on file  Other Topics Concern  . Not on file  Social History Narrative   Lives at home with his wife and two cats. Married for almost 45 years   Right handed   Caffeine: 1 cup every morning     Family History:  The patient's family history includes Hypertension in his father.   ROS:   Please see the history of present  illness.    Review of Systems  Respiratory: Positive for wheezing.    All other systems reviewed and are negative.   PHYSICAL EXAM:   VS:  BP 122/68   Pulse 76   Ht 6\' 1"  (1.854 m)   Wt 213 lb (96.6 kg)   BMI 28.10 kg/m   Physical Exam  GEN: Well nourished, well developed, in no acute distress  Neck: no  JVD, carotid bruits, or masses Cardiac:RRR; no murmurs, rubs, or gallops  Respiratory:  Decreased breath sounds but clear to auscultation bilaterally, normal work of breathing GI: soft, nontender, nondistended, + BS Ext: without cyanosis, clubbing, or edema, left foot in a boot Neuro:  Alert and Oriented x 3 Psych: euthymic mood, full affect  Wt Readings from Last 3 Encounters:  06/21/18 213 lb (96.6 kg)  06/14/18 195 lb (88.5 kg)  05/31/18 195 lb (88.5 kg)      Studies/Labs Reviewed:   EKG:  EKG is not ordered today.    Recent Labs: 04/27/2018: NT-Pro BNP 3,814 05/06/2018: ALT 23; B Natriuretic Peptide 379.3; TSH 3.874 05/08/2018: Magnesium 1.7 05/11/2018: Hemoglobin 8.7; Platelets 362 05/19/2018: BUN 23; Creatinine, Ser 1.47; Potassium 4.4; Sodium 134   Lipid Panel    Component Value Date/Time   CHOL 75 03/17/2018 0339   TRIG 114 03/17/2018 0339   HDL 13 (L) 03/17/2018 0339   CHOLHDL 5.8 03/17/2018 0339   VLDL 23 03/17/2018 0339   LDLCALC 39 03/17/2018 0339    Additional studies/ records that were reviewed today include:   Echo 03/17/2018 LV EF: 60% -   65%   Study Conclusions   - Left ventricle: The cavity size was normal. Systolic function was normal. The estimated ejection fraction was in the range of 60% to 65%. Wall motion was normal; there were no regional wall motion abnormalities. There was no evidence of elevated ventricular filling pressure by Doppler parameters. - Left atrium: The atrium was moderately dilated.   Impressions:   - No cardiac source of emboli was indentified.    ASSESSMENT:    1. Paroxysmal atrial fibrillation (HCC)   2. Acute  on chronic diastolic CHF (congestive heart failure) ([place])   3. Essential hypertension   4. Stage 3 chronic kidney disease (HCC)   5. Stroke-like episode ([place]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   6. Myocarditis, unspecified chronicity, unspecified myocarditis type ([place])   7. Anemia, unspecified type      PLAN:  In order of problems listed above:  PAF status post DCCV 05/05/2018 on Eliquis for CHA2DS2-VASc score of 5.  Was bradycardic last office visit and diltiazem decreased-heart rate up to the 70's.  No fast heart rates.  Feeling much better.  Chronic diastolic CHF now compensated we will check labs today  Essential hypertension blood pressure normal  Stage III CKD rechecking renal function as well as CBC as he has been anemic but last hemoglobin was 11.3 06/05/2018/creatinine 1.34 that same day.  CVA treated with TPA 02/2018   Medication Adjustments/Labs and Tests Ordered: Current medicines are reviewed at length with the patient today.  Concerns regarding medicines are outlined above.  Medication changes, Labs and Tests ordered today are listed in the Patient Instructions below. Patient Instructions   Medication Instructions:  Your physician recommends that you continue on your current medications as directed. Please refer to the Current Medication list given to you today.   Labwork: TODAY:  BMET, CBC, SERUM IRON, & TIBC  Testing/Procedures: None ordered  Follow-Up: Your physician recommends that you schedule a follow-up appointment in: [place] DR. [name]   Any Other Special Instructions Will Be Listed Below (If Applicable).     If you need a refill on your cardiac medications before your next appointment, please call your pharmacy.      Signed, Ermalinda Barrios, PA-C  06/21/2018 1:01 PM    [facility] [place], [place], [place]  [address]  Phone: [phone]; Fax: [phone]

## 2018-06-22 DIAGNOSIS — M86272 Subacute osteomyelitis, left ankle and foot: Secondary | ICD-10-CM | POA: Diagnosis not present

## 2018-06-22 DIAGNOSIS — E1169 Type 2 diabetes mellitus with other specified complication: Secondary | ICD-10-CM | POA: Diagnosis not present

## 2018-06-22 DIAGNOSIS — I130 Hypertensive heart and chronic kidney disease with heart failure and stage 1 through stage 4 chronic kidney disease, or unspecified chronic kidney disease: Secondary | ICD-10-CM | POA: Diagnosis not present

## 2018-06-22 DIAGNOSIS — E1140 Type 2 diabetes mellitus with diabetic neuropathy, unspecified: Secondary | ICD-10-CM | POA: Diagnosis not present

## 2018-06-22 DIAGNOSIS — T8781 Dehiscence of amputation stump: Secondary | ICD-10-CM | POA: Diagnosis not present

## 2018-06-22 DIAGNOSIS — I481 Persistent atrial fibrillation: Secondary | ICD-10-CM | POA: Diagnosis not present

## 2018-06-22 LAB — CBC
Hematocrit: 37.6 % (ref 37.5–51.0)
Hemoglobin: 12.2 g/dL — ABNORMAL LOW (ref 13.0–17.7)
MCH: 27.5 pg (ref 26.6–33.0)
MCHC: 32.4 g/dL (ref 31.5–35.7)
MCV: 85 fL (ref 79–97)
Platelets: 354 10*3/uL (ref 150–450)
RBC: 4.44 x10E6/uL (ref 4.14–5.80)
RDW: 16.4 % — ABNORMAL HIGH (ref 12.3–15.4)
WBC: 12.3 10*3/uL — ABNORMAL HIGH (ref 3.4–10.8)

## 2018-06-22 LAB — IRON AND TIBC
Iron Saturation: 13 % — ABNORMAL LOW (ref 15–55)
Iron: 41 ug/dL (ref 38–169)
Total Iron Binding Capacity: 316 ug/dL (ref 250–450)
UIBC: 275 ug/dL (ref 111–343)

## 2018-06-22 LAB — BASIC METABOLIC PANEL
BUN/Creatinine Ratio: 24 (ref 10–24)
BUN: 39 mg/dL — ABNORMAL HIGH (ref 8–27)
CO2: 26 mmol/L (ref 20–29)
Calcium: 9.9 mg/dL (ref 8.6–10.2)
Chloride: 97 mmol/L (ref 96–106)
Creatinine, Ser: 1.6 mg/dL — ABNORMAL HIGH (ref 0.76–1.27)
GFR calc Af Amer: 48 mL/min/{1.73_m2} — ABNORMAL LOW (ref >59)
GFR calc non Af Amer: 42 mL/min/{1.73_m2} — ABNORMAL LOW (ref >59)
Glucose: 166 mg/dL — ABNORMAL HIGH (ref 65–99)
Potassium: 4.8 mmol/L (ref 3.5–5.2)
Sodium: 141 mmol/L (ref 134–144)

## 2018-06-23 ENCOUNTER — Telehealth: Payer: Self-pay

## 2018-06-23 DIAGNOSIS — I5032 Chronic diastolic (congestive) heart failure: Secondary | ICD-10-CM

## 2018-06-23 MED ORDER — FUROSEMIDE 20 MG PO TABS: 20.0000 mg | ORAL_TABLET | Freq: Every day | ORAL | 1 refills | Status: DC

## 2018-06-23 NOTE — Telephone Encounter (Signed)
-----   Message from Imogene Burn, PA-C sent at 06/22/2018  8:23 AM EDT ----- Kidney function up a bit. Would decrease lasix to 20 mg once a day. If he gains 2-3 lbs overnight or has increase swelling he can take 40 mg daily. Repeat bmet in 4 weeks. Hemoglobin much better at 12.2 up from 8.7 so much better.

## 2018-06-23 NOTE — Telephone Encounter (Signed)
Called and made patient aware that his hemoglobin is much better at 12.2 up from 8.7. Made patient aware that his kidney function is up a bit. Instructed patient to decrease lasix to 20 mg once a day. Made him aware that if he gains 2-3 lbs overnight or has increased swelling he can take 40 mg daily. Patient scheduled for repeat BMET on 07/24/18. Patient verbalized understanding and thanked me for the call.

## 2018-06-27 ENCOUNTER — Telehealth (INDEPENDENT_AMBULATORY_CARE_PROVIDER_SITE_OTHER): Payer: Self-pay | Admitting: Physician Assistant

## 2018-06-27 DIAGNOSIS — R3912 Poor urinary stream: Secondary | ICD-10-CM | POA: Diagnosis not present

## 2018-06-27 DIAGNOSIS — Z89432 Acquired absence of left foot: Secondary | ICD-10-CM | POA: Diagnosis not present

## 2018-06-27 DIAGNOSIS — Z23 Encounter for immunization: Secondary | ICD-10-CM | POA: Diagnosis not present

## 2018-06-27 DIAGNOSIS — E1169 Type 2 diabetes mellitus with other specified complication: Secondary | ICD-10-CM | POA: Diagnosis not present

## 2018-06-27 DIAGNOSIS — R3914 Feeling of incomplete bladder emptying: Secondary | ICD-10-CM | POA: Diagnosis not present

## 2018-06-27 NOTE — Telephone Encounter (Signed)
Last 3 ov notes faxed to [facility] [phone]

## 2018-06-28 DIAGNOSIS — T8781 Dehiscence of amputation stump: Secondary | ICD-10-CM | POA: Diagnosis not present

## 2018-06-28 DIAGNOSIS — M86272 Subacute osteomyelitis, left ankle and foot: Secondary | ICD-10-CM | POA: Diagnosis not present

## 2018-06-28 DIAGNOSIS — E1140 Type 2 diabetes mellitus with diabetic neuropathy, unspecified: Secondary | ICD-10-CM | POA: Diagnosis not present

## 2018-06-28 DIAGNOSIS — I130 Hypertensive heart and chronic kidney disease with heart failure and stage 1 through stage 4 chronic kidney disease, or unspecified chronic kidney disease: Secondary | ICD-10-CM | POA: Diagnosis not present

## 2018-06-28 DIAGNOSIS — E1169 Type 2 diabetes mellitus with other specified complication: Secondary | ICD-10-CM | POA: Diagnosis not present

## 2018-06-28 DIAGNOSIS — I481 Persistent atrial fibrillation: Secondary | ICD-10-CM | POA: Diagnosis not present

## 2018-06-29 ENCOUNTER — Other Ambulatory Visit: Payer: Self-pay

## 2018-07-05 ENCOUNTER — Other Ambulatory Visit: Payer: Self-pay

## 2018-07-10 MED ORDER — FUROSEMIDE 20 MG PO TABS: 20.0000 mg | ORAL_TABLET | Freq: Every day | ORAL | 3 refills | Status: DC

## 2018-07-10 MED ORDER — ROSUVASTATIN CALCIUM 5 MG PO TABS: 5.0000 mg | ORAL_TABLET | Freq: Every day | ORAL | 3 refills | Status: DC

## 2018-07-10 MED ORDER — APIXABAN 5 MG PO TABS: 5.0000 mg | ORAL_TABLET | Freq: Two times a day (BID) | ORAL | 3 refills | Status: DC

## 2018-07-10 MED ORDER — METOPROLOL SUCCINATE ER 200 MG PO TB24: 200.0000 mg | ORAL_TABLET | Freq: Every day | ORAL | 3 refills | Status: DC

## 2018-07-12 ENCOUNTER — Ambulatory Visit (INDEPENDENT_AMBULATORY_CARE_PROVIDER_SITE_OTHER): Payer: Medicare Other | Admitting: Orthopedic Surgery

## 2018-07-19 ENCOUNTER — Encounter (INDEPENDENT_AMBULATORY_CARE_PROVIDER_SITE_OTHER): Payer: Self-pay | Admitting: Orthopedic Surgery

## 2018-07-19 ENCOUNTER — Ambulatory Visit (INDEPENDENT_AMBULATORY_CARE_PROVIDER_SITE_OTHER): Payer: Medicare Other | Admitting: Orthopedic Surgery

## 2018-07-19 VITALS — Ht 73.0 in | Wt 213.0 lb

## 2018-07-19 DIAGNOSIS — Z89432 Acquired absence of left foot: Secondary | ICD-10-CM

## 2018-07-19 NOTE — Progress Notes (Signed)
Office Visit Note   Patient: Kevin [name]           Date of Birth: Jun 23, 1941           MRN: [id number] Visit Date: 07/19/2018              Requested by: [name], MD [address] [place], [place] [address] PCP: [name], MD  Chief Complaint  Patient presents with  . Left Foot - Follow-up    TMA of Left foot      HPI: Patient is a 77 year old gentleman 4 months status post transmetatarsal amputation on the left.  He is wearing medical compression stockings he is going to be fit with his double upright braces and shoes with [employer] on Monday.  Assessment & Plan: Visit Diagnoses:  1. History of transmetatarsal amputation of left foot ([place])     Plan: Patient states he wants to wear bedroom slippers around the house recommended a stiff soled supportive shoe to provide him support from falling forward.  Recommended using a cane for support.  Follow-Up Instructions: Return if symptoms worsen or fail to improve.   Ortho Exam  Patient is alert, oriented, no adenopathy, well-dressed, normal affect, normal respiratory effort. Examination the incision is well-healed he has dorsiflexion to neutral with no Achilles contracture there are no plantar ulcers no redness no cellulitis.  Patient is wearing his knee-high medical compression stockings he states this has been instrumental in his wound healing.  Imaging: No results found. No images are attached to the encounter.  Labs: Lab Results  Component Value Date   HGBA1C 7.6 (H) 03/17/2018   REPTSTATUS 03/22/2018 FINAL 03/17/2018   GRAMSTAIN  03/17/2018    MODERATE WBC PRESENT, PREDOMINANTLY PMN MODERATE GRAM POSITIVE COCCI FEW GRAM NEGATIVE RODS    CULT  03/17/2018    MODERATE STREPTOCOCCUS GROUP C MODERATE PREVOTELLA MELANINOGENICA BETA LACTAMASE POSITIVE Performed at [facility], [place] [address], [place], [place] [address]    LABORGA STAPHYLOCOCCUS SPECIES (COAGULASE NEGATIVE)  03/17/2018     Lab Results  Component Value Date   ALBUMIN 3.2 (L) 05/06/2018   ALBUMIN 3.2 (L) 05/06/2018   ALBUMIN 2.3 (L) 03/28/2018    Body mass index is 28.1 kg/m.  Orders:  No orders of the defined types were placed in this encounter.  No orders of the defined types were placed in this encounter.    Procedures: No procedures performed  Clinical Data: No additional findings.  ROS:  All other systems negative, except as noted in the HPI. Review of Systems  Objective: Vital Signs: Ht 6\' 1"  (1.854 m)   Wt 213 lb (96.6 kg)   BMI 28.10 kg/m   Specialty Comments:  No specialty comments available.  PMFS History: Patient Active Problem List   Diagnosis Date Noted  . Dehiscence of amputation stump ([place])   . Acute CHF (congestive heart failure) ([place]) 05/07/2018  . Acute on chronic diastolic CHF (congestive heart failure) ([place])   . Acute on chronic congestive heart failure ([place]) 05/06/2018  . Renal insufficiency 05/06/2018  . Type 2 diabetes mellitus with vascular disease ([place]) 05/06/2018  . Debility 03/31/2018  . Urinary retention with incomplete bladder emptying 03/31/2018  . History of transmetatarsal amputation of left foot ([place])   . 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 ([place])   . Status post transmetatarsal amputation of left foot ([place])   . Postoperative pain   . Benign prostatic hyperplasia with urinary retention   .  New onset atrial fibrillation ([place])   . Incontinence of feces   . Cerebrovascular accident (CVA) ([place])   . Benign essential HTN   . Diabetes mellitus type 2 in nonobese (HCC)   . Hypokalemia   . Paroxysmal atrial fibrillation (HCC)   . Leukocytosis   . SIRS (systemic inflammatory response syndrome) (HCC)   . Acute blood loss anemia   . Stage 3 chronic kidney disease ([place])   . Essential hypertension 03/20/2018  . Tachycardia 03/20/2018  . Sepsis ([place]) 03/20/2018  . Diabetes ([place]) 03/20/2018  . Subacute osteomyelitis,  left ankle and foot ([place]) 03/20/2018  . A-fib ([place]) 03/20/2018  . 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 Strain of left tibialis anterior muscle 12/19/2015  . 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 Fracture of radius, distal, left, closed 12/19/2012   Past Medical History:  Diagnosis Date  . A-fib ([place]) 03/20/2018  . Acute blood loss anemia   . Benign essential HTN   . Benign prostatic hyperplasia with urinary retention   . Cerebrovascular accident (CVA) ([place])   . CKD (chronic kidney disease), stage III ([place])   . Diabetes ([place]) 03/20/2018  . Diabetes mellitus type 2 in nonobese (HCC)   . Essential hypertension 03/20/2018  . Former tobacco use   . H/O ulcerative colitis 1993  . Hypertension   . Hypokalemia   . Leukocytosis   . New onset atrial fibrillation ([place])   . Osteomyelitis ([place])    a. 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  [name] (paroxysmal atrial fibrillation) ([place])    a. dx 03/2018 in setting of stroke, osteomyelitis.  . Sepsis ([place]) 03/20/2018  . Stage 3 chronic kidney disease ([place])    pt/family unaware  . Status post transmetatarsal amputation of left foot ([place])   . Stroke ([place]) 03/2018  . 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 Tachycardia 03/20/2018  . Wrist fracture    Left    Family History  Problem Relation Age of Onset  . Hypertension Father     Past Surgical History:  Procedure Laterality Date  . AMPUTATION Left 03/24/2018   Procedure: Transmetatarsal Amputation Left Foot;  Surgeon: [name], MD;  Location: [facility];  Service: Orthopedics;  Laterality: Left;  . CARDIOVERSION N/A 05/05/2018   Procedure: CARDIOVERSION;  Surgeon: [name], MD;  Location[facility] ENDOSCOPY;  Service: Cardiovascular;  Laterality: N/A;  . COLONOSCOPY  12/2017   benign, remission from ulcerative colitis in 90s  . I&D EXTREMITY Left 03/17/2018   Procedure: IRRIGATION AND DEBRIDEMENT FOOT;  Surgeon: [name], MD;  Location: [facility];   Service: Orthopedics;  Laterality: Left;  . STUMP REVISION Left 05/10/2018   Procedure: REVISION LEFT TRANSMETATARSAL AMPUTATION;  Surgeon: [name], MD;  Location: [facility];  Service: Orthopedics;  Laterality: Left;   Social History   Occupational History  . Not on file  Tobacco Use  . Smoking status: Former Smoker    Packs/day: 1.00    Types: Cigarettes    Start date: 1963    Last attempt to quit: 1985    Years since quitting: 34.8  . Smokeless tobacco: Never Used  Substance and Sexual Activity  . Alcohol use: Yes    Alcohol/week: 4.0 standard drinks    Types: 4 Cans of beer per week    Comment: social   . Drug use: Never  . Sexual activity: Not on file

## 2018-07-21 ENCOUNTER — Ambulatory Visit: Payer: Medicare Other | Admitting: Physical Medicine & Rehabilitation

## 2018-07-24 ENCOUNTER — Other Ambulatory Visit: Payer: Medicare Other | Admitting: *Deleted

## 2018-07-24 ENCOUNTER — Telehealth: Payer: Self-pay

## 2018-07-24 DIAGNOSIS — I5032 Chronic diastolic (congestive) heart failure: Secondary | ICD-10-CM | POA: Diagnosis not present

## 2018-07-24 LAB — BASIC METABOLIC PANEL
BUN/Creatinine Ratio: 25 — ABNORMAL HIGH (ref 10–24)
BUN: 41 mg/dL — ABNORMAL HIGH (ref 8–27)
CO2: 23 mmol/L (ref 20–29)
Calcium: 9.3 mg/dL (ref 8.6–10.2)
Chloride: 94 mmol/L — ABNORMAL LOW (ref 96–106)
Creatinine, Ser: 1.65 mg/dL — ABNORMAL HIGH (ref 0.76–1.27)
GFR calc Af Amer: 47 mL/min/{1.73_m2} — ABNORMAL LOW (ref >59)
GFR calc non Af Amer: 40 mL/min/{1.73_m2} — ABNORMAL LOW (ref >59)
Glucose: 414 mg/dL — ABNORMAL HIGH (ref 65–99)
Potassium: 4.4 mmol/L (ref 3.5–5.2)
Sodium: 134 mmol/L (ref 134–144)

## 2018-07-24 NOTE — Telephone Encounter (Signed)
-----   Message from Imogene Burn, PA-C sent at 07/24/2018  4:10 PM EDT ----- Kidney function up slightly but similar to 1 month ago. Glucose extremely high at 414. Needs to see his PCP to get this under better control

## 2018-07-24 NOTE — Telephone Encounter (Signed)
Left message for patient to call back  

## 2018-07-25 NOTE — Telephone Encounter (Signed)
The patient has been notified of the result and verbalized understanding.  All questions (if any) were answered. Results forwarded to PCP. Cleon Gustin, RN 07/25/2018 12:55 PM

## 2018-07-27 ENCOUNTER — Encounter: Payer: Self-pay | Admitting: Adult Health

## 2018-08-03 ENCOUNTER — Ambulatory Visit: Payer: Medicare Other | Admitting: Adult Health

## 2018-08-10 DIAGNOSIS — R3912 Poor urinary stream: Secondary | ICD-10-CM | POA: Diagnosis not present

## 2018-08-10 DIAGNOSIS — N130 Hydronephrosis with ureteropelvic junction obstruction: Secondary | ICD-10-CM | POA: Diagnosis not present

## 2018-08-17 ENCOUNTER — Encounter: Payer: Medicare Other | Attending: Physical Medicine & Rehabilitation | Admitting: Physical Medicine & Rehabilitation

## 2018-08-17 ENCOUNTER — Encounter: Payer: Self-pay | Admitting: Physical Medicine & Rehabilitation

## 2018-08-17 VITALS — BP 117/67 | HR 82 | Resp 14 | Ht 73.0 in | Wt 220.0 lb

## 2018-08-17 DIAGNOSIS — Z89432 Acquired absence of left foot: Secondary | ICD-10-CM | POA: Diagnosis not present

## 2018-08-17 DIAGNOSIS — R5381 Other malaise: Secondary | ICD-10-CM

## 2018-08-17 DIAGNOSIS — R339 Retention of urine, unspecified: Secondary | ICD-10-CM | POA: Diagnosis not present

## 2018-08-17 DIAGNOSIS — A409 Streptococcal sepsis, unspecified: Secondary | ICD-10-CM | POA: Insufficient documentation

## 2018-08-17 DIAGNOSIS — I10 Essential (primary) hypertension: Secondary | ICD-10-CM | POA: Diagnosis not present

## 2018-08-17 DIAGNOSIS — Z87891 Personal history of nicotine dependence: Secondary | ICD-10-CM | POA: Insufficient documentation

## 2018-08-17 DIAGNOSIS — I480 Paroxysmal atrial fibrillation: Secondary | ICD-10-CM | POA: Insufficient documentation

## 2018-08-17 DIAGNOSIS — I639 Cerebral infarction, unspecified: Secondary | ICD-10-CM

## 2018-08-17 DIAGNOSIS — E1122 Type 2 diabetes mellitus with diabetic chronic kidney disease: Secondary | ICD-10-CM | POA: Diagnosis not present

## 2018-08-17 DIAGNOSIS — E119 Type 2 diabetes mellitus without complications: Secondary | ICD-10-CM | POA: Diagnosis not present

## 2018-08-17 DIAGNOSIS — I129 Hypertensive chronic kidney disease with stage 1 through stage 4 chronic kidney disease, or unspecified chronic kidney disease: Secondary | ICD-10-CM | POA: Insufficient documentation

## 2018-08-17 DIAGNOSIS — Z8673 Personal history of transient ischemic attack (TIA), and cerebral infarction without residual deficits: Secondary | ICD-10-CM | POA: Diagnosis not present

## 2018-08-17 DIAGNOSIS — R2990 Unspecified symptoms and signs involving the nervous system: Secondary | ICD-10-CM

## 2018-08-17 DIAGNOSIS — N183 Chronic kidney disease, stage 3 (moderate): Secondary | ICD-10-CM | POA: Diagnosis not present

## 2018-08-17 DIAGNOSIS — N400 Enlarged prostate without lower urinary tract symptoms: Secondary | ICD-10-CM | POA: Diagnosis not present

## 2018-08-17 NOTE — Progress Notes (Signed)
Subjective:    Patient ID: Kevin [name], male    DOB: 02-23-1941, 77 y.o.   MRN: [id number]  HPI 77 year old male with history of T2DM with neuropathic ulcer, ulcerative colitis, hypertension presents for follow-up after left transmetatarsal amputation.  Patient was last seen in clinic by NP on 04/12/2018, notes reviewed.  Since that time, patient saw Ortho, notes reviewed suggesting maintaining precautions to limit falls and protect stump. He is picking up his shoe later today. He completed therapies.  CBGs have been well controlled.  Bladder issues have resolved. He continues to follow up with Ortho (released) and PCP and Urology and Cards. He sees Neurology next week. Denies falls.    Pain Inventory Average Pain 0 Pain Right Now 0 My pain is no pain  In the last 24 hours, has pain interfered with the following? General activity 0 Relation with others 0 Enjoyment of life 0 What TIME of day is your pain at its worst? no pain Sleep (in general) Good  Pain is worse with: no pain Pain improves with:  no pain Relief from Meds: no pain  Mobility walk without assistance  Function retired Do you have any goals in this area?  no  Neuro/Psych No problems in this area  Prior Studies Any changes since last visit?  no  Physicians involved in your care Any changes since last visit?  no   Family History  Problem Relation Age of Onset  . Hypertension Father    Social History   Socioeconomic History  . Marital status: Married    Spouse name: Not on file  . Number of children: Not on file  . Years of education: Not on file  . Highest education level: Bachelor's degree (e.g., BA, AB, BS)  Occupational History  . Not on file  Social Needs  . Financial resource strain: Not on file  . Food insecurity:    Worry: Not on file    Inability: Not on file  . Transportation needs:    Medical: Not on file    Non-medical: Not on file  Tobacco Use  . Smoking status: Former  Smoker    Packs/day: 1.00    Types: Cigarettes    Start date: 1963    Last attempt to quit: 1985    Years since quitting: 34.9  . Smokeless tobacco: Never Used  Substance and Sexual Activity  . Alcohol use: Yes    Alcohol/week: 4.0 standard drinks    Types: 4 Cans of beer per week    Comment: social   . Drug use: Never  . Sexual activity: Not on file  Lifestyle  . Physical activity:    Days per week: Not on file    Minutes per session: Not on file  . Stress: Not on file  Relationships  . Social connections:    Talks on phone: Not on file    Gets together: Not on file    Attends religious service: Not on file    Active member of club or organization: Not on file    Attends meetings of clubs or organizations: Not on file    Relationship status: Not on file  Other Topics Concern  . Not on file  Social History Narrative   Lives at home with his wife and two cats. Married for almost 45 years   Right handed   Caffeine: 1 cup every morning   Past Surgical History:  Procedure Laterality Date  . AMPUTATION Left 03/24/2018   Procedure:  Transmetatarsal Amputation Left Foot;  Surgeon: [name], MD;  Location: [facility];  Service: Orthopedics;  Laterality: Left;  . CARDIOVERSION N/A 05/05/2018   Procedure: CARDIOVERSION;  Surgeon: [name], MD;  Location: [facility] ENDOSCOPY;  Service: Cardiovascular;  Laterality: N/A;  . COLONOSCOPY  12/2017   benign, remission from ulcerative colitis in 90s  . I&D EXTREMITY Left 03/17/2018   Procedure: IRRIGATION AND DEBRIDEMENT FOOT;  Surgeon: [name], MD;  Location: [facility];  Service: Orthopedics;  Laterality: Left;  . STUMP REVISION Left 05/10/2018   Procedure: REVISION LEFT TRANSMETATARSAL AMPUTATION;  Surgeon: [name], MD;  Location: [facility];  Service: Orthopedics;  Laterality: Left;   Past Medical History:  Diagnosis Date  . A-fib ([place]) 03/20/2018  . Acute blood loss anemia   . Benign essential HTN   . Benign prostatic  hyperplasia with urinary retention   . Cerebrovascular accident (CVA) ([place])   . CKD (chronic kidney disease), stage III ([place])   . Diabetes ([place]) 03/20/2018  . Diabetes mellitus type 2 in nonobese (HCC)   . Essential hypertension 03/20/2018  . Former tobacco use   . H/O ulcerative colitis 1993  . Hypertension   . Hypokalemia   . Leukocytosis   . New onset atrial fibrillation ([place])   . Osteomyelitis ([place])    a. 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  [name] (paroxysmal atrial fibrillation) ([place])    a. dx 03/2018 in setting of stroke, osteomyelitis.  . Sepsis ([place]) 03/20/2018  . Stage 3 chronic kidney disease ([place])    pt/family unaware  . Status post transmetatarsal amputation of left foot ([place])   . Stroke ([place]) 03/2018  . 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 Tachycardia 03/20/2018  . Wrist fracture    Left   BP 117/67 (BP Location: Left Arm, Patient Position: Sitting, Cuff Size: Normal)   Pulse 82   Resp 14   Ht 6\' 1"  (1.854 m)   Wt 220 lb (99.8 kg)   SpO2 93%   BMI 29.03 kg/m   Opioid Risk Score:   Fall Risk Score:  `1  Depression screen PHQ 2/9  Depression screen PHQ 2/9 04/12/2018  Decreased Interest 0  Down, Depressed, Hopeless 0  PHQ - 2 Score 0  Altered sleeping 1  Tired, decreased energy 1  Change in appetite 0  Feeling bad or failure about yourself  0  Trouble concentrating 0  Moving slowly or fidgety/restless 0  Suicidal thoughts 0  PHQ-9 Score 2  Difficult doing work/chores Somewhat difficult    Review of Systems  Constitutional: Negative.   HENT: Negative.   Eyes: Negative.   Respiratory: Negative.   Cardiovascular: Negative.   Gastrointestinal: Negative.   Endocrine: Negative.   Genitourinary: Negative.   Musculoskeletal: Negative.   Skin: Negative.   Psychiatric/Behavioral: Negative.   All other systems reviewed and are negative.      Objective:   Physical Exam Constitutional: He appears well-developed and well-nourished.  HENT:  Normocephalic and atraumatic.  Eyes: EOM are normal. No discharge.  Cardiovascular:  RRR. No JVD. Respiratory: Effort normal and breath sounds normal.  GI: soft. Bowel sounds are normal. Small reducible umbilical hernia, stable.   Musculoskeletal: Left foot tenderness, improving. No edema Neurological: He is alert and oriented.  Motor: Right upper extremity/right lower extremity: 5/5 proximal distal Left upper extremity: Shoulder abduction, elbow flexion 4+/5 elbow extension 4+/5, wrist extension 4+/5, hand intrinsics 4+/5 Left upper extremity: Hip flexion and knee extension 5/5  Skin: Healed incision.   Psychiatric: He has a normal mood and affect. His behavior is normal    Assessment & Plan:  77 year old male with history of T2DM with neuropathic ulcer, ulcerative colitis, hypertension presents for follow-up after left transmetatarsal amputation.    1. Deficits with mobility, transfers, self-care secondary to left transmetatarsal amputation.  Cont HEP  WBAT  To pick up shoe today  Pt to follow up with PCP regarding potential for following up with amputee care  2. T2 DM:   Cont meds  Controlled at present  3. New onset A fib:   Follow up with Cards  4. Stroke like episode s/p IV TPA/ Debility:   Follow up with Neurology

## 2018-08-18 NOTE — Progress Notes (Signed)
[facility] [address] [place]. [place] [address] [phone]       OFFICE FOLLOW UP NOTE  [name] Date of Birth:  [birth year] Medical Record Number:  [id number]   Reason for Referral: stroke follow up  CHIEF COMPLAINT:  Chief Complaint  Patient presents with  . Follow-up    Stroke follow up room in back hallway pt alone    HPI: Kevin [name] is being seen today in the office for stroke like episode s/p IV tPA on 03/16/18. History obtained from patient and chart review. Reviewed all radiology images and labs personally.  Kevin [name] is a 77 y.o. male with history of HTN and DB who presented with L sided weakness, L facial droop and dysphagia.  CT head reviewed and showed no acute stroke.  Patient did receive TPA without complication.  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  Repeat CT scan 24 hours after TPA negative for acute infarct.  MRI head reviewed was negative for acute infarct but did show small vessel disease.  2D echo showed an EF of 60 to 65% without cardiac source of embolus.  LDL satisfactory at 39 and recommended continuation of current statin.  A1c elevated at 7.6 and recommended tight glycemic control with close PCP follow-up.  HTN stable after medication adjustment and recommended BP goal normotensive.  Patient was previously on aspirin 81 mg PTA and recommended to continue this but throughout hospital stay patient was found to develop new onset atrial fibrillation and recommended management with Eliquis.  Patient was discharged to [facility] for continuation of therapies.  05/03/2018 visit: Patient is being seen today for hospital follow-up and is accompanied by his wife.  He states he has been doing well with no residual stroke deficits.  He continues to be compliant with Eliquis without side effects of bleeding or bruising.  Continues to take Crestor  without side effects myalgias.  Blood pressure today 102/54 and per patient, this is typically his normal as he monitors this at home.  Does check glucose levels at home which have been stable.  Recently diagnosed with CHF and was started on Lasix which has improved his shortness of breath and his weight has been consistent.  He will be undergoing cardioversion on Friday.  He does have a follow-up with [name] Given on 05/04/2018 for follow-up of left metatarsal amputation.  He states this is been doing well, continues to wear the boot and has been using rolling walker for ambulation.  Denies new or worsening stroke/TIA symptoms.  08/21/2018 interval history: Patient is being seen today for 95-month follow-up visit.  He continues to do well without recurring symptoms or residual deficits.  He continues to take Eliquis without side effects of bleeding or bruising.  Continues to take Crestor without side effects myalgias.  Blood pressure today satisfactory 132/64. Monitors glucose levels at home which have been satisfactory.  He continues to stay active with walking and denies any complications. He plans on returning to fitness center soon but is waiting to be cleared by all providers. Occasional shortness of breath with CHF especially with increased exertion but denies debilitating and is aware of slowing down activity. No further concerns. Denies new or worsening stroke/TIA symptoms.    ROS:   14 system review of systems performed and negative with exception of no complaints   PMH:  Past Medical History:  Diagnosis Date  .  A-fib ([place]) 03/20/2018  . Acute blood loss anemia   . Benign essential HTN   . Benign prostatic hyperplasia with urinary retention   . Cerebrovascular accident (CVA) ([place])   . CKD (chronic kidney disease), stage III ([place])   . Diabetes ([place]) 03/20/2018  . Diabetes mellitus type 2 in nonobese (HCC)   . Essential hypertension 03/20/2018  . Former tobacco use   . H/O ulcerative colitis 1993    . Hypertension   . Hypokalemia   . Leukocytosis   . New onset atrial fibrillation ([place])   . Osteomyelitis ([place])    a. 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  [name] (paroxysmal atrial fibrillation) ([place])    a. dx 03/2018 in setting of stroke, osteomyelitis.  . Sepsis ([place]) 03/20/2018  . Stage 3 chronic kidney disease ([place])    pt/family unaware  . Status post transmetatarsal amputation of left foot ([place])   . Stroke ([place]) 03/2018  . Stroke-like episode ([place]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 03/16/2018  . Tachycardia 03/20/2018  . Wrist fracture    Left    PSH:  Past Surgical History:  Procedure Laterality Date  . AMPUTATION Left 03/24/2018   Procedure: Transmetatarsal Amputation Left Foot;  Surgeon: [name], MD;  Location: [facility];  Service: Orthopedics;  Laterality: Left;  . CARDIOVERSION N/A 05/05/2018   Procedure: CARDIOVERSION;  Surgeon: [name], MD;  Location: [facility] ENDOSCOPY;  Service: Cardiovascular;  Laterality: N/A;  . COLONOSCOPY  12/2017   benign, remission from ulcerative colitis in 90s  . I&D EXTREMITY Left 03/17/2018   Procedure: IRRIGATION AND DEBRIDEMENT FOOT;  Surgeon: [name], MD;  Location: [facility];  Service: Orthopedics;  Laterality: Left;  . STUMP REVISION Left 05/10/2018   Procedure: REVISION LEFT TRANSMETATARSAL AMPUTATION;  Surgeon: [name], MD;  Location: [facility];  Service: Orthopedics;  Laterality: Left;    Social History:  Social History   Socioeconomic History  . Marital status: Married    Spouse name: Not on file  . Number of children: Not on file  . Years of education: Not on file  . Highest education level: Bachelor's degree (e.g., BA, AB, BS)  Occupational History  . Not on file  Social Needs  . Financial resource strain: Not on file  . Food insecurity:    Worry: Not on file    Inability: Not on file  . Transportation needs:    Medical: Not on file    Non-medical: Not on file  Tobacco Use  . Smoking status: Former Smoker    Packs/day:  1.00    Types: Cigarettes    Start date: 1963    Last attempt to quit: 1985    Years since quitting: 34.9  . Smokeless tobacco: Never Used  Substance and Sexual Activity  . Alcohol use: Yes    Alcohol/week: 4.0 standard drinks    Types: 4 Cans of beer per week    Comment: social   . Drug use: Never  . Sexual activity: Not on file  Lifestyle  . Physical activity:    Days per week: Not on file    Minutes per session: Not on file  . Stress: Not on file  Relationships  . Social connections:    Talks on phone: Not on file    Gets together: Not on file    Attends religious service: Not on file    Active member of club or organization: Not on file    Attends meetings of clubs or organizations: Not  on file    Relationship status: Not on file  . Intimate partner violence:    Fear of current or ex partner: Not on file    Emotionally abused: Not on file    Physically abused: Not on file    Forced sexual activity: Not on file  Other Topics Concern  . Not on file  Social History Narrative   Lives at home with his wife and two cats. Married for almost 45 years   Right handed   Caffeine: 1 cup every morning    Family History:  Family History  Problem Relation Age of Onset  . Hypertension Father     Medications:   Current Outpatient Medications on File Prior to Visit  Medication Sig Dispense Refill  . apixaban (ELIQUIS) 5 MG TABS tablet Take 1 tablet (5 mg total) by mouth 2 (two) times daily. 180 tablet 3  . diltiazem (CARDIZEM CD) 120 MG 24 hr capsule Take 1 capsule (120 mg total) by mouth daily. 90 capsule 3  . FLUoxetine (PROZAC) 20 MG capsule Take 20 mg by mouth daily.    . furosemide (LASIX) 20 MG tablet Take 1 tablet (20 mg total) by mouth daily. You may take an extra 20 mg tablet for increased swelling or weight gain 90 tablet 3  . glimepiride (AMARYL) 4 MG tablet Take 4 mg by mouth daily with breakfast.    . mesalamine (APRISO) 0.375 g 24 hr capsule Take 750 mg by mouth 2  (two) times daily.     . metFORMIN (GLUCOPHAGE) 500 MG tablet Take 1,000 mg by mouth 2 (two) times daily with a meal.     . metoprolol (TOPROL-XL) 200 MG 24 hr tablet Take 1 tablet (200 mg total) by mouth daily. Take with or immediately following a meal. 90 tablet 3  . Multiple Vitamin (MULTIVITAMIN WITH MINERALS) TABS tablet Take 1 tablet by mouth daily.    . rosuvastatin (CRESTOR) 5 MG tablet Take 1 tablet (5 mg total) by mouth daily. 90 tablet 3  . sitaGLIPtin (JANUVIA) 100 MG tablet Take 100 mg by mouth daily.    . tamsulosin (FLOMAX) 0.4 MG CAPS capsule Take 1 capsule (0.4 mg total) by mouth 2 times daily at 12 noon and 4 pm. 60 capsule 0  . trimethoprim (TRIMPEX) 100 MG tablet Take 100 mg by mouth daily.  11   No current facility-administered medications on file prior to visit.     Allergies:   Allergies  Allergen Reactions  . Sulfa Antibiotics Rash     Physical Exam  Vitals:   08/21/18 1243  BP: 132/64  Pulse: 71  Weight: 217 lb (98.4 kg)  Height: 6\' 1"  (1.854 m)   Body mass index is 28.63 kg/m. No exam data present  General: well developed, well nourished, pleasant elderly Caucasian male, seated, in no evident distress Head: head normocephalic and atraumatic.   Neck: supple with no carotid or supraclavicular bruits Cardiovascular: regular rate and rhythm, no murmurs Musculoskeletal: Left metatarsal amputation Skin:  no rash/petichiae Vascular:  Normal pulses all extremities  Neurologic Exam Mental Status: Awake and fully alert. Oriented to place and time. Recent and remote memory intact. Attention span, concentration and fund of knowledge appropriate. Mood and affect appropriate.  Cranial Nerves: Fundoscopic exam reveals sharp disc margins. Pupils equal, briskly reactive to light. Extraocular movements full without nystagmus. Visual fields full to confrontation. Hearing intact. Facial sensation intact. Face, tongue, palate moves normally and symmetrically.  Motor:  Normal bulk and  tone. Normal strength in all tested extremity muscles. Sensory.: intact to touch , pinprick , position and vibratory sensation.  Coordination: Rapid alternating movements normal in all extremities. Finger-to-nose and heel-to-shin performed accurately bilaterally. Gait and Station: Arises from chair without difficulty. Stance is normal. Gait demonstrates normal stride length and balance with assistance of rolling walker. Reflexes: 1+ and symmetric. Toes downgoing.     Diagnostic Data (Labs, Imaging, Testing)   IMAGING Ct Head Code Stroke Wo Contrast 03/16/2018 2051 1. Negative for age. No acute cortically based infarct or intracranial hemorrhage is identified. 2. ASPECTS is 10. 3. Chronic maxillary sinusitis.   Ct Angio Head W Or Wo Contrast Ct Angio Neck W Or Wo Contrast 03/16/2018 2125 1. Negative for large vessel occlusion. 2. Positive for atherosclerosis in the head and neck, and Aortic Atherosclerosis (ICD10-I70.0). 3. 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 No hemodynamically significant carotid or right vertebral stenosis. 4.  Emphysema   Ct Head Wo Contrast 03/17/2018 1751 Negative non-contrast CT HEAD for age.   Mr Brain Wo Contrast 03/18/2018 1450 1. No acute intracranial abnormality. 2. Mild chronic small vessel ischemic disease.   2D Echocardiogram  03/17/2018  - Left ventricle: The cavity size was normal. Systolic function wasnormal. The estimated ejection fraction was in the range of 60% to 65%. Wall motion was normal; there were no regional wallmotion abnormalities. There was no evidence of elevatedventricular filling pressure by Doppler parameters.  - Left atrium: The atrium was moderately dilated. Impressions:   No cardiac source of emboli was indentified      ASSESSMENT: Kevin [name] is a 77 y.o. year old male here with strokelike episode s/p TPA on 03/16/2018 secondary to new onset AF.  Vascular risk factors include HTN, HLD, DM and new onset AF.  Patient continues to well from a stroke standpoint without residual deficits.     PLAN: -Continue Eliquis (apixaban) daily  and Crestor for secondary stroke prevention -F/u with PCP regarding your HLD, HTN and DM management -f/u with cardiologist as scheduled for atrial fibrillation and Eliquis management -Was advised to continue to stay active as tolerated and maintain a healthy diet -continue to monitor BP at home -Maintain strict control of hypertension with blood pressure goal below 130/90, diabetes with hemoglobin A1c goal below 6.5% and cholesterol with LDL cholesterol (bad cholesterol) goal below 70 mg/dL. I also advised the patient to eat a healthy diet with plenty of whole grains, cereals, fruits and vegetables, exercise regularly and maintain ideal body weight.  Follow up in 6 months or call earlier if needed   Greater than 50% of time during this 25 minute visit was spent on counseling,explanation of diagnosis of strokelike symptoms s/p tPA, reviewing risk factor management of HLD, HTN, DM and AF, planning of further management, discussion with patient and family and coordination of care    Kevin [name], [facility]  [facility] [address] [place], [place] [address]  Phone [phone] Fax [phone]

## 2018-08-21 ENCOUNTER — Ambulatory Visit (INDEPENDENT_AMBULATORY_CARE_PROVIDER_SITE_OTHER): Payer: Medicare Other | Admitting: Adult Health

## 2018-08-21 ENCOUNTER — Encounter: Payer: Self-pay | Admitting: Adult Health

## 2018-08-21 VITALS — BP 132/64 | HR 71 | Ht 73.0 in | Wt 217.0 lb

## 2018-08-21 DIAGNOSIS — E785 Hyperlipidemia, unspecified: Secondary | ICD-10-CM | POA: Diagnosis not present

## 2018-08-21 DIAGNOSIS — E119 Type 2 diabetes mellitus without complications: Secondary | ICD-10-CM | POA: Diagnosis not present

## 2018-08-21 DIAGNOSIS — I639 Cerebral infarction, unspecified: Secondary | ICD-10-CM | POA: Diagnosis not present

## 2018-08-21 DIAGNOSIS — R2990 Unspecified symptoms and signs involving the nervous system: Secondary | ICD-10-CM

## 2018-08-21 DIAGNOSIS — I10 Essential (primary) hypertension: Secondary | ICD-10-CM | POA: Diagnosis not present

## 2018-08-21 DIAGNOSIS — I480 Paroxysmal atrial fibrillation: Secondary | ICD-10-CM

## 2018-08-21 NOTE — Progress Notes (Signed)
I agree with the above plan 

## 2018-08-21 NOTE — Patient Instructions (Addendum)
Continue Eliquis (apixaban) daily  and Crestor  for secondary stroke prevention  Continue to follow up with PCP regarding cholesterol, diabetees and blood pressure management  Continue to follow up with cardiology regarding atrial fibrillation and eliquis management    Continue to stay active and maintain a healthy diet  Continue to monitor blood pressure at home  Maintain strict control of hypertension with blood pressure goal below 130/90, diabetes with hemoglobin A1c goal below 6.5% and cholesterol with LDL cholesterol (bad cholesterol) goal below 70 mg/dL. I also advised the patient to eat a healthy diet with plenty of whole grains, cereals, fruits and vegetables, exercise regularly and maintain ideal body weight.  Followup in the future with me in 6 months or call earlier if needed       Thank you for coming to see [place] at [facility]. I hope we have been able to provide you high quality care today.  You may receive a patient satisfaction survey over the next few weeks. We would appreciate your feedback and comments so that we may continue to improve ourselves and the health of our patients.

## 2018-09-07 DIAGNOSIS — K5190 Ulcerative colitis, unspecified, without complications: Secondary | ICD-10-CM | POA: Diagnosis not present

## 2018-10-17 ENCOUNTER — Other Ambulatory Visit: Payer: Self-pay

## 2018-10-18 MED ORDER — DILTIAZEM HCL ER COATED BEADS 120 MG PO CP24: 120.0000 mg | ORAL_CAPSULE | Freq: Every day | ORAL | 2 refills | Status: DC

## 2018-10-18 MED ORDER — FUROSEMIDE 20 MG PO TABS: 20.0000 mg | ORAL_TABLET | Freq: Every day | ORAL | 2 refills | Status: DC

## 2018-10-18 MED ORDER — METOPROLOL SUCCINATE ER 200 MG PO TB24: 200.0000 mg | ORAL_TABLET | Freq: Every day | ORAL | 2 refills | Status: DC

## 2018-10-23 ENCOUNTER — Other Ambulatory Visit: Payer: Self-pay

## 2018-10-23 MED ORDER — FUROSEMIDE 20 MG PO TABS: 20.0000 mg | ORAL_TABLET | Freq: Every day | ORAL | 2 refills | Status: DC

## 2018-10-23 MED ORDER — APIXABAN 5 MG PO TABS: 5.0000 mg | ORAL_TABLET | Freq: Two times a day (BID) | ORAL | 1 refills | Status: DC

## 2018-10-23 MED ORDER — METOPROLOL SUCCINATE ER 200 MG PO TB24: 200.0000 mg | ORAL_TABLET | Freq: Every day | ORAL | 2 refills | Status: DC

## 2018-10-23 MED ORDER — DILTIAZEM HCL ER COATED BEADS 120 MG PO CP24: 120.0000 mg | ORAL_CAPSULE | Freq: Every day | ORAL | 2 refills | Status: DC

## 2018-10-23 NOTE — Addendum Note (Signed)
Addended by: Derl Barrow on: 10/23/2018 09:26 AM   Modules accepted: Orders

## 2018-10-23 NOTE — Telephone Encounter (Signed)
Pt last saw Ermalinda Barrios, PA on 06/21/18, last labs 07/24/18 Creat 1.65, age 78, weight 98.4kg, based on specified criteria pt is on appropriate dosage of Eliquis 5mg  BID.  Will refill rx.

## 2018-11-01 DIAGNOSIS — N130 Hydronephrosis with ureteropelvic junction obstruction: Secondary | ICD-10-CM | POA: Diagnosis not present

## 2018-11-01 DIAGNOSIS — N1330 Unspecified hydronephrosis: Secondary | ICD-10-CM | POA: Diagnosis not present

## 2018-11-01 DIAGNOSIS — N1339 Other hydronephrosis: Secondary | ICD-10-CM | POA: Diagnosis not present

## 2018-11-02 ENCOUNTER — Ambulatory Visit (INDEPENDENT_AMBULATORY_CARE_PROVIDER_SITE_OTHER): Payer: Medicare Other | Admitting: Cardiovascular Disease

## 2018-11-02 ENCOUNTER — Encounter: Payer: Self-pay | Admitting: Cardiovascular Disease

## 2018-11-02 VITALS — BP 142/70 | HR 74 | Ht 73.2 in | Wt 229.8 lb

## 2018-11-02 DIAGNOSIS — I480 Paroxysmal atrial fibrillation: Secondary | ICD-10-CM

## 2018-11-02 DIAGNOSIS — I10 Essential (primary) hypertension: Secondary | ICD-10-CM | POA: Diagnosis not present

## 2018-11-02 DIAGNOSIS — I5032 Chronic diastolic (congestive) heart failure: Secondary | ICD-10-CM

## 2018-11-02 MED ORDER — APIXABAN 5 MG PO TABS: 5.0000 mg | ORAL_TABLET | Freq: Two times a day (BID) | ORAL | 1 refills | Status: DC

## 2018-11-02 NOTE — Patient Instructions (Signed)

## 2018-11-02 NOTE — Progress Notes (Signed)
Chief Complaint  Patient presents with  . Follow-up    atrial fibrillation   History of Present Illness: 78 yo male with history of HTN, DM, CVA, persistent atrial fibrillation on Eliquis, CKD stage 3, chronic diastolic CHF and RBBB here today for cardiac follow up. He was admitted to [facility] June 2019 with a stroke and was given TPA. He was found to have atrial fibrillation during the hospitalization. He was septic from wound of his left foot and required transmetatarsal amputation. He was started on Eliquis in June 2019 and was cardioverted to sinus in August 2019. Echo 03/17/18 with LVEF=60-65%.   He is here today for follow up. The patient denies any chest pain, dyspnea, palpitations, lower extremity edema, orthopnea, PND, dizziness, near syncope or syncope. He has occasional dyspnea with exertion. This improves with more activity. He has been very active. No bleeding issues.   Primary Care Physician: [name], MD  Past Medical History:  Diagnosis Date  . A-fib ([place]) 03/20/2018  . Acute blood loss anemia   . Benign essential HTN   . Benign prostatic hyperplasia with urinary retention   . Cerebrovascular accident (CVA) ([place])   . CKD (chronic kidney disease), stage III ([place])   . Diabetes ([place]) 03/20/2018  . Diabetes mellitus type 2 in nonobese (HCC)   . Essential hypertension 03/20/2018  . Former tobacco use   . H/O ulcerative colitis 1993  . Hypertension   . Hypokalemia   . Leukocytosis   . New onset atrial fibrillation ([place])   . Osteomyelitis ([place])    a. 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  [name] (paroxysmal atrial fibrillation) ([place])    a. dx 03/2018 in setting of stroke, osteomyelitis.  . Sepsis ([place]) 03/20/2018  . Stage 3 chronic kidney disease ([place])    pt/family unaware  . Status post transmetatarsal amputation of left foot ([place])   . Stroke ([place]) 03/2018  . Stroke-like episode ([place]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 03/16/2018  . Tachycardia 03/20/2018  . Wrist fracture    Left    Past  Surgical History:  Procedure Laterality Date  . AMPUTATION Left 03/24/2018   Procedure: Transmetatarsal Amputation Left Foot;  Surgeon: [name], MD;  Location: [facility];  Service: Orthopedics;  Laterality: Left;  . CARDIOVERSION N/A 05/05/2018   Procedure: CARDIOVERSION;  Surgeon: [name], MD;  Location: [facility] ENDOSCOPY;  Service: Cardiovascular;  Laterality: N/A;  . COLONOSCOPY  12/2017   benign, remission from ulcerative colitis in 90s  . I&D EXTREMITY Left 03/17/2018   Procedure: IRRIGATION AND DEBRIDEMENT FOOT;  Surgeon: [name], MD;  Location: [facility];  Service: Orthopedics;  Laterality: Left;  . STUMP REVISION Left 05/10/2018   Procedure: REVISION LEFT TRANSMETATARSAL AMPUTATION;  Surgeon: [name], MD;  Location: [facility];  Service: Orthopedics;  Laterality: Left;    Current Outpatient Medications  Medication Sig Dispense Refill  . apixaban (ELIQUIS) 5 MG TABS tablet Take 1 tablet (5 mg total) by mouth 2 (two) times daily. 60 tablet 1  . diltiazem (CARDIZEM CD) 120 MG 24 hr capsule Take 1 capsule (120 mg total) by mouth daily. 90 capsule 2  . FLUoxetine (PROZAC) 20 MG capsule Take 20 mg by mouth daily.    . furosemide (LASIX) 20 MG tablet Take 1 tablet (20 mg total) by mouth daily. You may take an extra 20 mg tablet for increased swelling or weight gain 180 tablet 2  . glimepiride (AMARYL) 4 MG tablet Take 4 mg by mouth daily  with breakfast.    . mesalamine (APRISO) 0.375 g 24 hr capsule Take 750 mg by mouth 2 (two) times daily.     . metFORMIN (GLUCOPHAGE) 500 MG tablet Take 1,000 mg by mouth 2 (two) times daily with a meal.     . metoprolol (TOPROL-XL) 200 MG 24 hr tablet Take 1 tablet (200 mg total) by mouth daily. Take with or immediately following a meal. 90 tablet 2  . Multiple Vitamin (MULTIVITAMIN WITH MINERALS) TABS tablet Take 1 tablet by mouth daily.    . sitaGLIPtin (JANUVIA) 100 MG tablet Take 100 mg by mouth daily.    . tamsulosin (FLOMAX) 0.4 MG  CAPS capsule Take 1 capsule (0.4 mg total) by mouth 2 times daily at 12 noon and 4 pm. 60 capsule 0  . trimethoprim (TRIMPEX) 100 MG tablet Take 100 mg by mouth daily.  11  . rosuvastatin (CRESTOR) 5 MG tablet Take 1 tablet (5 mg total) by mouth daily. 90 tablet 3   No current facility-administered medications for this visit.     Allergies  Allergen Reactions  . Sulfa Antibiotics Rash    Social History   Socioeconomic History  . Marital status: Married    Spouse name: Not on file  . Number of children: Not on file  . Years of education: Not on file  . Highest education level: Bachelor's degree (e.g., BA, AB, BS)  Occupational History  . Not on file  Social Needs  . Financial resource strain: Not on file  . Food insecurity:    Worry: Not on file    Inability: Not on file  . Transportation needs:    Medical: Not on file    Non-medical: Not on file  Tobacco Use  . Smoking status: Former Smoker    Packs/day: 1.00    Types: Cigarettes    Start date: 1963    Last attempt to quit: 1985    Years since quitting: 35.1  . Smokeless tobacco: Never Used  Substance and Sexual Activity  . Alcohol use: Yes    Alcohol/week: 4.0 standard drinks    Types: 4 Cans of beer per week    Comment: social   . Drug use: Never  . Sexual activity: Not on file  Lifestyle  . Physical activity:    Days per week: Not on file    Minutes per session: Not on file  . Stress: Not on file  Relationships  . Social connections:    Talks on phone: Not on file    Gets together: Not on file    Attends religious service: Not on file    Active member of club or organization: Not on file    Attends meetings of clubs or organizations: Not on file    Relationship status: Not on file  . Intimate partner violence:    Fear of current or ex partner: Not on file    Emotionally abused: Not on file    Physically abused: Not on file    Forced sexual activity: Not on file  Other Topics Concern  . Not on file    Social History Narrative   Lives at home with his wife and two cats. Married for almost 45 years   Right handed   Caffeine: 1 cup every morning    Family History  Problem Relation Age of Onset  . Hypertension Father     Review of Systems:  As stated in the HPI and otherwise negative.   BP Marland Kitchen)  142/70   Pulse 74   Ht 6' 1.2" (1.859 m)   Wt 229 lb 12.8 oz (104.2 kg)   SpO2 95%   BMI 30.15 kg/m   Physical Examination: General: Well developed, well nourished, NAD  HEENT: OP clear, mucus membranes moist  SKIN: warm, dry. No rashes. Neuro: No focal deficits  Musculoskeletal: Muscle strength 5/5 all ext  Psychiatric: Mood and affect normal  Neck: No JVD, no carotid bruits, no thyromegaly, no lymphadenopathy.  Lungs:Clear bilaterally, no wheezes, rhonci, crackles Cardiovascular: Regular rate and rhythm. No murmurs, gallops or rubs. Abdomen:Soft. Bowel sounds present. Non-tender.  Extremities: No lower extremity edema. Pulses are 2 + in the bilateral DP/PT.  Echo 03/17/18: - Left ventricle: The cavity size was normal. Systolic function was   normal. The estimated ejection fraction was in the range of 60%   to 65%. Wall motion was normal; there were no regional wall   motion abnormalities. There was no evidence of elevated   ventricular filling pressure by Doppler parameters. - Left atrium: The atrium was moderately dilated.  Impressions:  - No cardiac source of emboli was indentified.  EKG:  EKG is not ordered today. The ekg ordered today demonstrates   Recent Labs: 04/27/2018: NT-Pro BNP 3,814 05/06/2018: ALT 23; B Natriuretic Peptide 379.3; TSH 3.874 05/08/2018: Magnesium 1.7 06/21/2018: Hemoglobin 12.2; Platelets 354 07/24/2018: BUN 41; Creatinine, Ser 1.65; Potassium 4.4; Sodium 134   Lipid Panel    Component Value Date/Time   CHOL 75 03/17/2018 0339   TRIG 114 03/17/2018 0339   HDL 13 (L) 03/17/2018 0339   CHOLHDL 5.8 03/17/2018 0339   VLDL 23 03/17/2018 0339    LDLCALC 39 03/17/2018 0339     Wt Readings from Last 3 Encounters:  11/02/18 229 lb 12.8 oz (104.2 kg)  08/21/18 217 lb (98.4 kg)  08/17/18 220 lb (99.8 kg)     Other studies Reviewed: Additional studies/ records that were reviewed today include: . Review of the above records demonstrates:    Assessment and Plan:   1. Atrial fibrillation, paroxysmal: He is in sinus today. CHADS VASC score of 5. Continue Toprol, Cardizem and Eliquis.   2. HTN: BP is well controlled. No changes  3. Chronic diastolic CHF: Volume status ok today. Continue Lasix   Current medicines are reviewed at length with the patient today.  The patient does not have concerns regarding medicines.  The following changes have been made:  no change  Labs/ tests ordered today include:  No orders of the defined types were placed in this encounter.    Disposition:   FU with me  in 6 months   Signed, Lauree Chandler, MD 11/02/2018 11:40 AM    [facility] [place], [place], [place]  [address] Phone: [phone]; Fax: [phone]

## 2018-11-05 IMAGING — MR MR FOOT*L* WO/W CM
4 of 9 series · 18 of 40 positions shown · IV contrast (multihance)
Comparison: Left foot x-rays dated March 17, 2018.

CLINICAL DATA: Plantar foot ulcer at the base of the toes with
exposed bone. Evaluate for osteomyelitis.

EXAM:
MRI OF THE LEFT FOREFOOT WITHOUT AND WITH CONTRAST
TECHNIQUE: Multiplanar, multisequence MR imaging of the left forefoot was
performed both before and after administration of intravenous
contrast.
CONTRAST:  20mL MULTIHANCE GADOBENATE DIMEGLUMINE 529 MG/ML IV SOLN

[Series 4: T1 · coronal · 4.0mm · 0.29mm/px · 5 of 30 slices shown (1 of 2)]
[im 1/30]
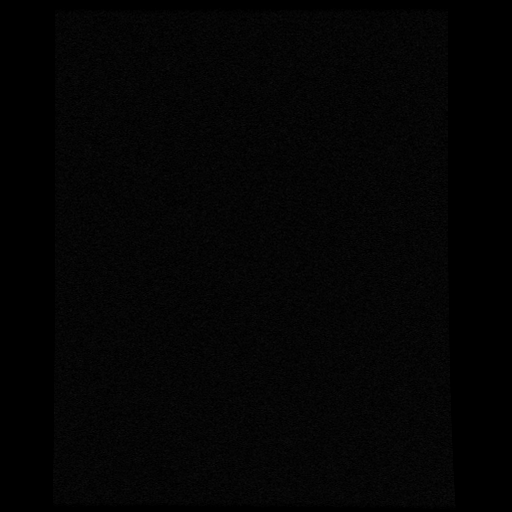
[im 8/30]
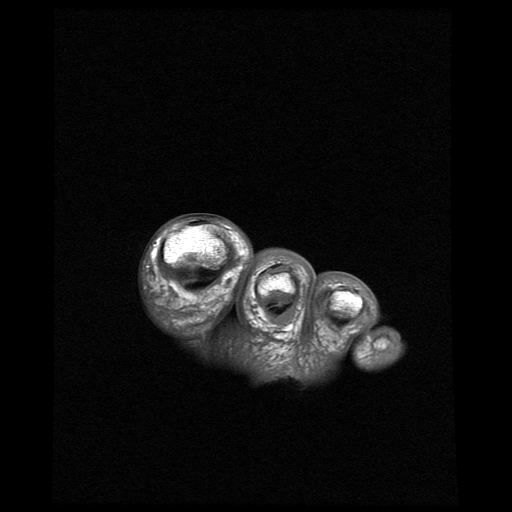
[im 15/30]
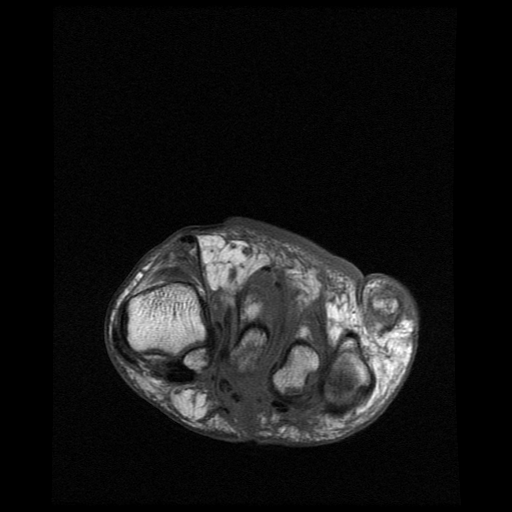
[im 22/30]
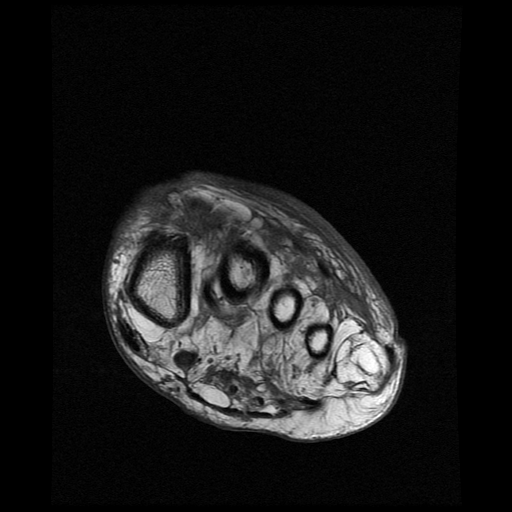
[im 30/30]
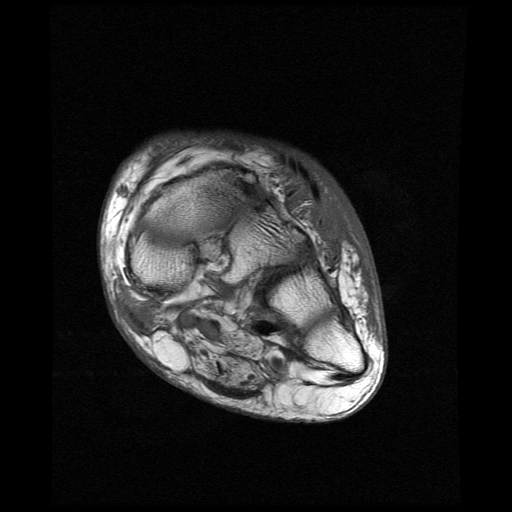

[Series 5: T1 fat-sat · coronal · non-contrast · 4.0mm · 0.29mm/px · 4 of 30 slices shown]
[im 1/30]
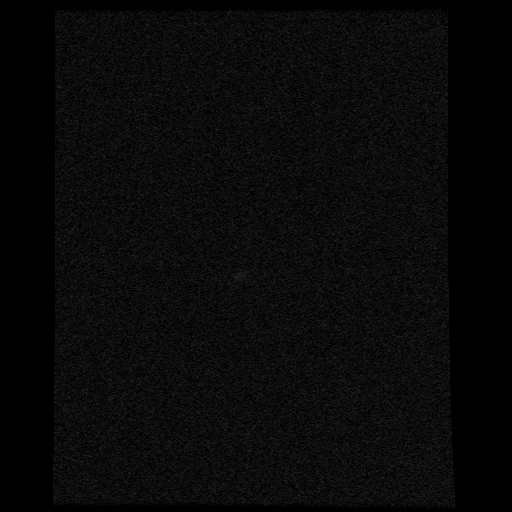
[im 6/30]
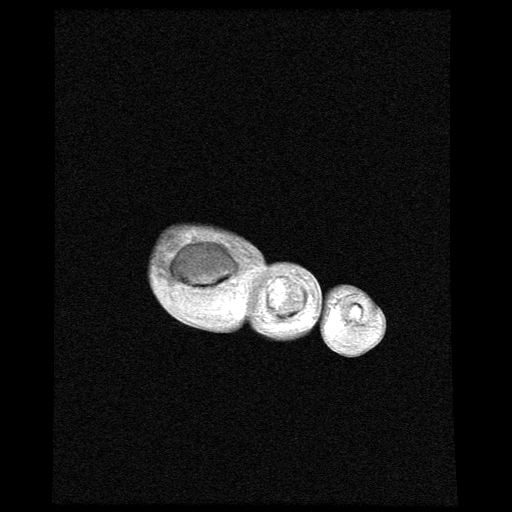
[im 18/30]
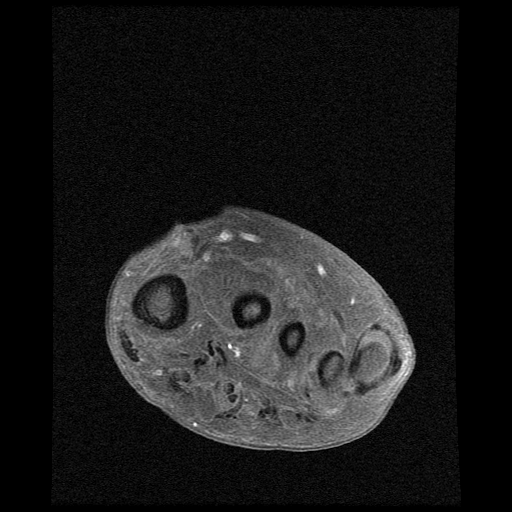
[im 30/30]
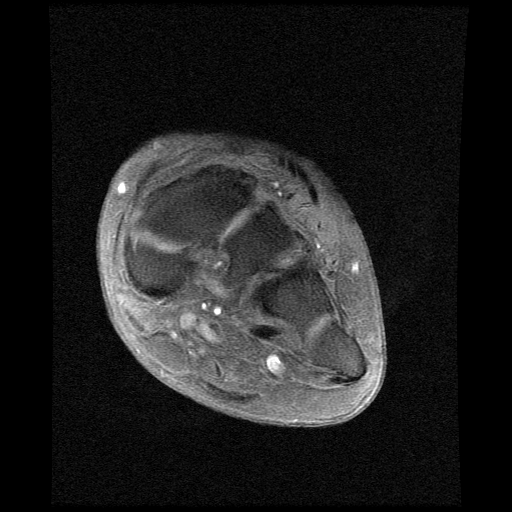

[Series 6: T1 · axial · 4.0mm · 0.29mm/px · z∈[-60,+19]mm · 3 of 18 slices shown (2 of 2)]
[im 1/18]
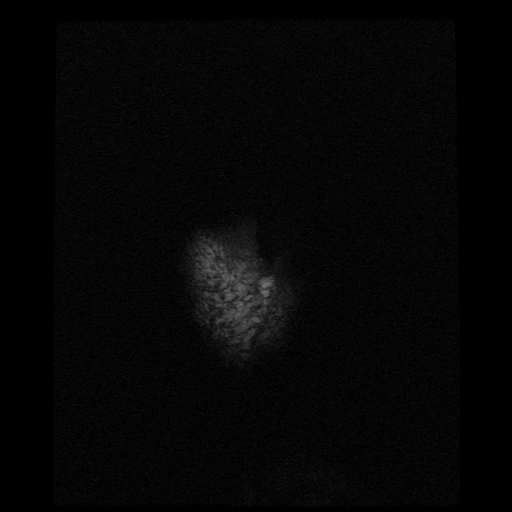
[im 9/18]
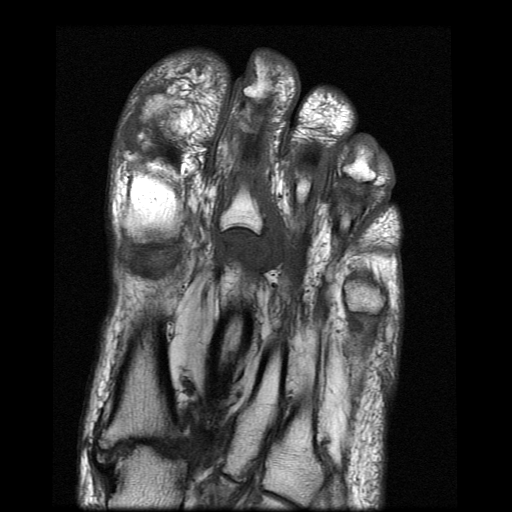
[im 18/18]
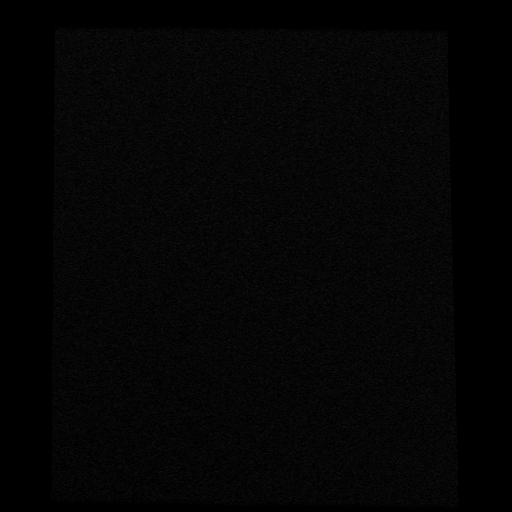

[Series 7: T2 fat-sat · coronal · 4.0mm · 0.29mm/px · 6 of 30 slices shown]
[im 1/30]
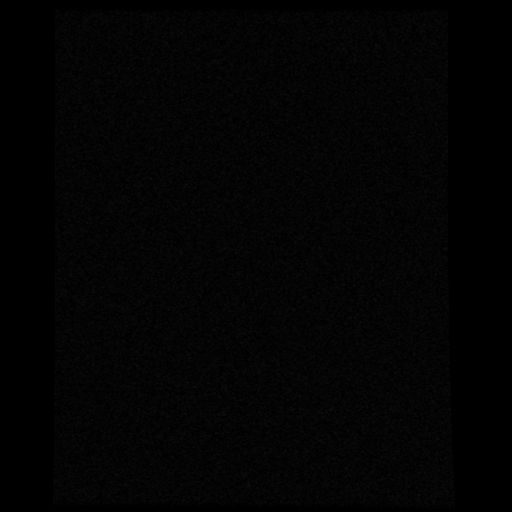
[im 6/30]
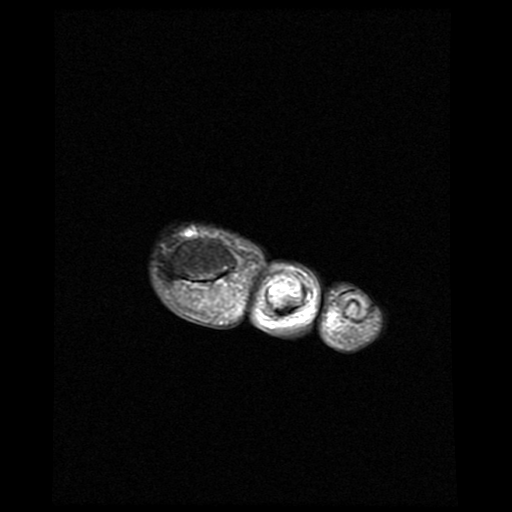
[im 12/30]
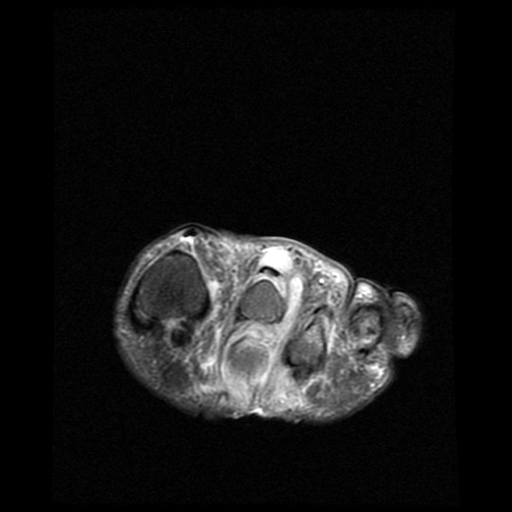
[im 18/30]
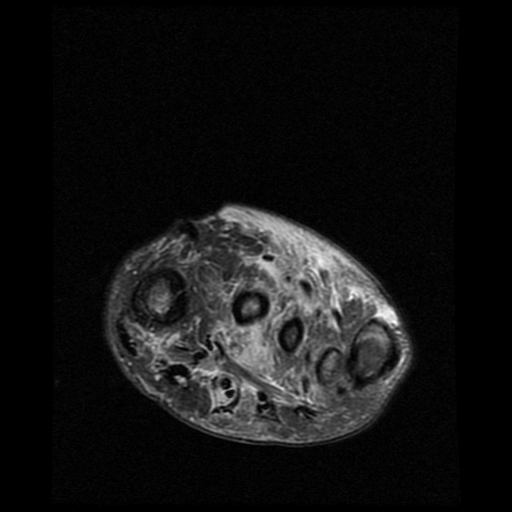
[im 24/30]
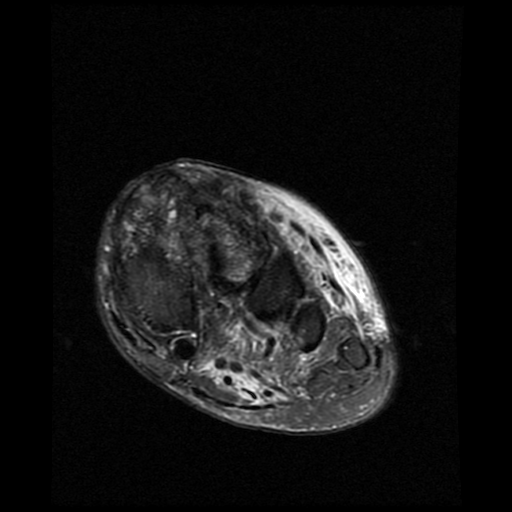
[im 30/30]
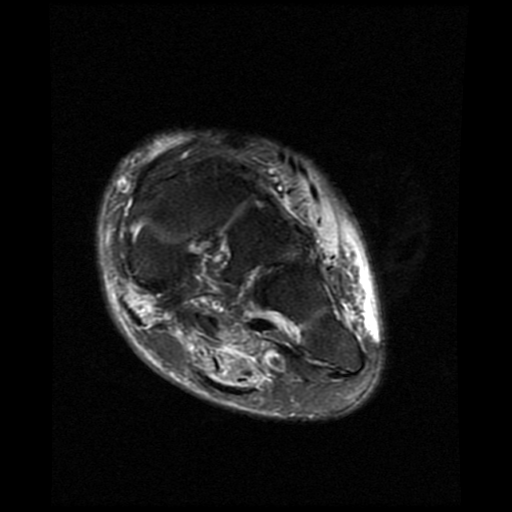

[18 of 40 positions shown; findings below may reference images not displayed]

FINDINGS: Bones/Joint/Cartilage

There is marrow edema within the second metatarsal head with
cortical irregularity and small erosions, consistent with early
osteomyelitis. Additional faint marrow edema within the second
proximal phalanx, second middle phalanx, third proximal phalanx, and
fourth metatarsal head with preserved T1 marrow signal, likely
reactive.

Dorsal dislocation of the second proximal phalanx with respect to
the second metatarsal. Old healed fracture of the distal fifth
metatarsal shaft. Normal alignment. No joint effusion. Moderate
osteoarthritis of the first tarsometatarsal joint. Mild
osteoarthritis of the first MTP joint and second tarsometatarsal
joint. Chronic juxtacortical erosion along the medial aspect of the
third metatarsal head, likely related to gout.

Ligaments

The collateral ligaments at the second MTP joint are completely torn
remaining collateral ligaments are intact..

Muscles and Tendons
Flexor, peroneal and extensor compartment tendons are intact. Small
amount of fluid and enhancement surrounding the second flexor
tendons at the level of the distal second metatarsal. Severe fatty
atrophy of the intrinsic muscles of the forefoot.

Soft tissue
Soft tissue ulceration at the plantar base of the second metatarsal
head with a sinus tract extending to the bone. There is phlegmon and
small rim enhancing fluid surrounding the dislocated second MTP
joint extending into the first and second intermetatarsal spaces and
superior to the second extensor tendon at the base of the proximal
phalanx. No soft tissue mass.
IMPRESSION: 1. Soft tissue ulceration at the plantar base of the second
metatarsal head with sinus tract extending to bone. Dorsal
dislocation of the second MTP joint with prominent phlegmon and
small abscess surrounding the dislocated joint, extending into the
first and second intermetatarsal spaces and superior to the second
extensor tendons at the base of the proximal phalanx.
2. Early osteomyelitis of the second metatarsal head.
3. Small amount of fluid and enhancement surrounding the second
flexor tendons at the level of the distal second metatarsal,
concerning for infectious tenosynovitis.

## 2018-11-08 DIAGNOSIS — N130 Hydronephrosis with ureteropelvic junction obstruction: Secondary | ICD-10-CM | POA: Diagnosis not present

## 2018-11-08 DIAGNOSIS — R3914 Feeling of incomplete bladder emptying: Secondary | ICD-10-CM | POA: Diagnosis not present

## 2018-11-15 DIAGNOSIS — Z7984 Long term (current) use of oral hypoglycemic drugs: Secondary | ICD-10-CM | POA: Diagnosis not present

## 2018-11-15 DIAGNOSIS — E1169 Type 2 diabetes mellitus with other specified complication: Secondary | ICD-10-CM | POA: Diagnosis not present

## 2018-11-15 DIAGNOSIS — L84 Corns and callosities: Secondary | ICD-10-CM | POA: Diagnosis not present

## 2018-12-15 ENCOUNTER — Encounter (HOSPITAL_BASED_OUTPATIENT_CLINIC_OR_DEPARTMENT_OTHER): Payer: Self-pay

## 2018-12-15 ENCOUNTER — Inpatient Hospital Stay (HOSPITAL_BASED_OUTPATIENT_CLINIC_OR_DEPARTMENT_OTHER)
Admission: EM | Admit: 2018-12-15 | Discharge: 2018-12-19 | DRG: 872 | Disposition: A | Discharge status: Home / Self Care | Payer: Medicare Other | Attending: Internal Medicine | Admitting: Internal Medicine

## 2018-12-15 DIAGNOSIS — R338 Other retention of urine: Secondary | ICD-10-CM | POA: Diagnosis present

## 2018-12-15 DIAGNOSIS — Z7984 Long term (current) use of oral hypoglycemic drugs: Secondary | ICD-10-CM | POA: Diagnosis not present

## 2018-12-15 DIAGNOSIS — I5032 Chronic diastolic (congestive) heart failure: Secondary | ICD-10-CM | POA: Diagnosis not present

## 2018-12-15 DIAGNOSIS — R319 Hematuria, unspecified: Secondary | ICD-10-CM | POA: Diagnosis present

## 2018-12-15 DIAGNOSIS — E1169 Type 2 diabetes mellitus with other specified complication: Secondary | ICD-10-CM | POA: Diagnosis not present

## 2018-12-15 DIAGNOSIS — Z89422 Acquired absence of other left toe(s): Secondary | ICD-10-CM

## 2018-12-15 DIAGNOSIS — Z8673 Personal history of transient ischemic attack (TIA), and cerebral infarction without residual deficits: Secondary | ICD-10-CM | POA: Diagnosis not present

## 2018-12-15 DIAGNOSIS — I130 Hypertensive heart and chronic kidney disease with heart failure and stage 1 through stage 4 chronic kidney disease, or unspecified chronic kidney disease: Secondary | ICD-10-CM | POA: Diagnosis present

## 2018-12-15 DIAGNOSIS — N183 Chronic kidney disease, stage 3 unspecified: Secondary | ICD-10-CM | POA: Diagnosis present

## 2018-12-15 DIAGNOSIS — Z87891 Personal history of nicotine dependence: Secondary | ICD-10-CM | POA: Diagnosis not present

## 2018-12-15 DIAGNOSIS — Z8249 Family history of ischemic heart disease and other diseases of the circulatory system: Secondary | ICD-10-CM

## 2018-12-15 DIAGNOSIS — R739 Hyperglycemia, unspecified: Secondary | ICD-10-CM

## 2018-12-15 DIAGNOSIS — K5190 Ulcerative colitis, unspecified, without complications: Secondary | ICD-10-CM | POA: Diagnosis not present

## 2018-12-15 DIAGNOSIS — N390 Urinary tract infection, site not specified: Secondary | ICD-10-CM | POA: Diagnosis present

## 2018-12-15 DIAGNOSIS — N401 Enlarged prostate with lower urinary tract symptoms: Secondary | ICD-10-CM | POA: Diagnosis not present

## 2018-12-15 DIAGNOSIS — I10 Essential (primary) hypertension: Secondary | ICD-10-CM | POA: Diagnosis present

## 2018-12-15 DIAGNOSIS — Z7901 Long term (current) use of anticoagulants: Secondary | ICD-10-CM

## 2018-12-15 DIAGNOSIS — E860 Dehydration: Secondary | ICD-10-CM | POA: Diagnosis present

## 2018-12-15 DIAGNOSIS — N179 Acute kidney failure, unspecified: Secondary | ICD-10-CM | POA: Diagnosis not present

## 2018-12-15 DIAGNOSIS — E1122 Type 2 diabetes mellitus with diabetic chronic kidney disease: Secondary | ICD-10-CM | POA: Diagnosis not present

## 2018-12-15 DIAGNOSIS — E1159 Type 2 diabetes mellitus with other circulatory complications: Secondary | ICD-10-CM | POA: Diagnosis present

## 2018-12-15 DIAGNOSIS — Z79899 Other long term (current) drug therapy: Secondary | ICD-10-CM

## 2018-12-15 DIAGNOSIS — E785 Hyperlipidemia, unspecified: Secondary | ICD-10-CM | POA: Diagnosis present

## 2018-12-15 DIAGNOSIS — I480 Paroxysmal atrial fibrillation: Secondary | ICD-10-CM | POA: Diagnosis not present

## 2018-12-15 DIAGNOSIS — E1165 Type 2 diabetes mellitus with hyperglycemia: Secondary | ICD-10-CM | POA: Diagnosis not present

## 2018-12-15 DIAGNOSIS — Z794 Long term (current) use of insulin: Secondary | ICD-10-CM | POA: Diagnosis present

## 2018-12-15 DIAGNOSIS — E119 Type 2 diabetes mellitus without complications: Secondary | ICD-10-CM | POA: Diagnosis present

## 2018-12-15 DIAGNOSIS — I5033 Acute on chronic diastolic (congestive) heart failure: Secondary | ICD-10-CM | POA: Diagnosis present

## 2018-12-15 DIAGNOSIS — A419 Sepsis, unspecified organism: Secondary | ICD-10-CM | POA: Diagnosis not present

## 2018-12-15 LAB — CBC
HCT: 41.1 % (ref 39.0–52.0)
Hemoglobin: 13.6 g/dL (ref 13.0–17.0)
MCH: 29.9 pg (ref 26.0–34.0)
MCHC: 33.1 g/dL (ref 30.0–36.0)
MCV: 90.3 fL (ref 80.0–100.0)
Platelets: 326 10*3/uL (ref 150–400)
RBC: 4.55 MIL/uL (ref 4.22–5.81)
RDW: 12.7 % (ref 11.5–15.5)
WBC: 10.1 10*3/uL (ref 4.0–10.5)
nRBC: 0 % (ref 0.0–0.2)

## 2018-12-15 LAB — URINALYSIS, ROUTINE W REFLEX MICROSCOPIC
Bilirubin Urine: NEGATIVE
Glucose, UA: >=500 mg/dL — AB
Ketones, ur: NEGATIVE mg/dL
Nitrite: NEGATIVE
Protein, ur: 30 mg/dL — AB
Specific Gravity, Urine: 1.01 (ref 1.005–1.030)
pH: 6 (ref 5.0–8.0)

## 2018-12-15 LAB — URINALYSIS, MICROSCOPIC (REFLEX): WBC, UA: >50 WBC/hpf (ref 0–5)

## 2018-12-15 LAB — BASIC METABOLIC PANEL
Anion gap: 10 (ref 5–15)
BUN: 34 mg/dL — ABNORMAL HIGH (ref 8–23)
CO2: 25 mmol/L (ref 22–32)
Calcium: 9.3 mg/dL (ref 8.9–10.3)
Chloride: 94 mmol/L — ABNORMAL LOW (ref 98–111)
Creatinine, Ser: 2.24 mg/dL — ABNORMAL HIGH (ref 0.61–1.24)
GFR calc Af Amer: 32 mL/min — ABNORMAL LOW (ref >60)
GFR calc non Af Amer: 28 mL/min — ABNORMAL LOW (ref >60)
Glucose, Bld: 398 mg/dL — ABNORMAL HIGH (ref 70–99)
Potassium: 4.4 mmol/L (ref 3.5–5.1)
Sodium: 129 mmol/L — ABNORMAL LOW (ref 135–145)

## 2018-12-15 LAB — POCT I-STAT EG7
Acid-Base Excess: 1 mmol/L (ref 0.0–2.0)
Bicarbonate: 26.5 mmol/L (ref 20.0–28.0)
Calcium, Ion: 1.18 mmol/L (ref 1.15–1.40)
HCT: 41 % (ref 39.0–52.0)
Hemoglobin: 13.9 g/dL (ref 13.0–17.0)
O2 Saturation: 47 %
Patient temperature: 98.4
Potassium: 4.5 mmol/L (ref 3.5–5.1)
Sodium: 132 mmol/L — ABNORMAL LOW (ref 135–145)
TCO2: 28 mmol/L (ref 22–32)
pCO2, Ven: 44.5 mmHg (ref 44.0–60.0)
pH, Ven: 7.383 (ref 7.250–7.430)
pO2, Ven: 26 mmHg — CL (ref 32.0–45.0)

## 2018-12-15 LAB — CBG MONITORING, ED: Glucose-Capillary: 441 mg/dL — ABNORMAL HIGH (ref 70–99)

## 2018-12-15 MED ORDER — SODIUM CHLORIDE 0.9 % IV BOLUS
1000.0000 mL | Freq: Once | INTRAVENOUS | Status: AC
  Administered 2018-12-15: 1000 mL via INTRAVENOUS

## 2018-12-15 MED ORDER — INSULIN REGULAR HUMAN 100 UNIT/ML IJ SOLN
4.0000 [IU] | Freq: Once | INTRAMUSCULAR | Status: AC
  Administered 2018-12-15: 4 [IU] via SUBCUTANEOUS
  Filled 2018-12-15: qty 1

## 2018-12-15 MED ORDER — SODIUM CHLORIDE 0.9 % IV BOLUS
500.0000 mL | Freq: Once | INTRAVENOUS | Status: AC
  Administered 2018-12-15: 500 mL via INTRAVENOUS

## 2018-12-15 MED ORDER — SODIUM CHLORIDE 0.9 % IV SOLN
1.0000 g | Freq: Once | INTRAVENOUS | Status: AC
  Administered 2018-12-15: 1 g via INTRAVENOUS
  Filled 2018-12-15: qty 10

## 2018-12-15 NOTE — ED Triage Notes (Signed)
Pt states he was switched off of metformin since february and put on trulicity and sugars have been 350-480 and today at [facility] it was greater than 500's.

## 2018-12-15 NOTE — ED Provider Notes (Signed)
[facility] EMERGENCY DEPARTMENT Provider Note   CSN: [id number] Arrival date & time: 12/15/18  2102    History   Chief Complaint Chief Complaint  Patient presents with  . Hyperglycemia    HPI Kevin [name] is a 78 y.o. male.     78yo M w/ PMH including dCHF, A fib on Eliquis, CKD, HTN, CVA, type 2 diabetes mellitus who presents with hyperglycemia.  In February, the patient was switched off of metformin onto Trulicity because his kidney function was slightly worse.  Recently, his blood sugars have been creeping higher into the 300s and he has been in contact with his PCP.  Yesterday, they were instructed to double the Trulicity dose which they did.  Today he followed up at [facility] urgent care because of persistent hyperglycemia and there his blood glucose was over 500 so he was sent to the ED for evaluation.  He reports polyuria, generalized weakness, and not wanting to do his usual activities.  He denies any fever, URI symptoms, abdominal pain, or chest pain.  No vomiting or diarrhea.  The history is provided by the patient.  Hyperglycemia    Past Medical History:  Diagnosis Date  . A-fib ([place]) 03/20/2018  . Acute blood loss anemia   . Benign essential HTN   . Benign prostatic hyperplasia with urinary retention   . Cerebrovascular accident (CVA) ([place])   . CKD (chronic kidney disease), stage III ([place])   . Diabetes ([place]) 03/20/2018  . Diabetes mellitus type 2 in nonobese (HCC)   . Essential hypertension 03/20/2018  . Former tobacco use   . H/O ulcerative colitis 1993  . Hypertension   . Hypokalemia   . Leukocytosis   . New onset atrial fibrillation ([place])   . Osteomyelitis ([place])    a. 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  [name] (paroxysmal atrial fibrillation) ([place])    a. dx 03/2018 in setting of stroke, osteomyelitis.  . Sepsis ([place]) 03/20/2018  . Stage 3 chronic kidney disease ([place])    pt/family unaware  . Status post transmetatarsal amputation of left foot  ([place])   . Stroke ([place]) 03/2018  . Stroke-like episode ([place]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 03/16/2018  . Tachycardia 03/20/2018  . Wrist fracture    Left    Patient Active Problem List   Diagnosis Date Noted  . UTI (urinary tract infection) 12/15/2018  . Chronic diastolic CHF (congestive heart failure) ([place]) 12/15/2018  . Dehiscence of amputation stump ([place])   . Acute CHF (congestive heart failure) ([place]) 05/07/2018  . Acute on chronic diastolic CHF (congestive heart failure) ([place])   . Acute on chronic congestive heart failure ([place]) 05/06/2018  . Renal insufficiency 05/06/2018  . Type 2 diabetes mellitus with vascular disease ([place]) 05/06/2018  . Debility 03/31/2018  . Urinary retention with incomplete bladder emptying 03/31/2018  . History of transmetatarsal amputation of left foot ([place])   . 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 ([place])   . Status post transmetatarsal amputation of left foot ([place])   . Postoperative pain   . Benign prostatic hyperplasia with urinary retention   . New onset atrial fibrillation ([place])   . Incontinence of feces   . Cerebrovascular accident (CVA) ([place])   . Benign essential HTN   . Diabetes mellitus type 2 in nonobese (HCC)   . Hypokalemia   . Paroxysmal atrial fibrillation (HCC)   . Leukocytosis   . SIRS (systemic inflammatory response syndrome) (HCC)   . Acute blood loss anemia   . Acute  renal failure superimposed on stage 3 chronic kidney disease ([place])   . Essential hypertension 03/20/2018  . Tachycardia 03/20/2018  . Sepsis ([place]) 03/20/2018  . Diabetes ([place]) 03/20/2018  . Subacute osteomyelitis, left ankle and foot ([place]) 03/20/2018  . A-fib ([place]) 03/20/2018  . Stroke-like episode ([place]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 03/16/2018  . Strain of left tibialis anterior muscle 12/19/2015  . 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 Fracture of radius, distal, left, closed 12/19/2012    Past Surgical History:  Procedure Laterality Date  . AMPUTATION Left 03/24/2018    Procedure: Transmetatarsal Amputation Left Foot;  Surgeon: [name], MD;  Location: [facility];  Service: Orthopedics;  Laterality: Left;  . CARDIOVERSION N/A 05/05/2018   Procedure: CARDIOVERSION;  Surgeon: [name], MD;  Location: [facility] ENDOSCOPY;  Service: Cardiovascular;  Laterality: N/A;  . COLONOSCOPY  12/2017   benign, remission from ulcerative colitis in 90s  . I&D EXTREMITY Left 03/17/2018   Procedure: IRRIGATION AND DEBRIDEMENT FOOT;  Surgeon: [name], MD;  Location: [facility];  Service: Orthopedics;  Laterality: Left;  . STUMP REVISION Left 05/10/2018   Procedure: REVISION LEFT TRANSMETATARSAL AMPUTATION;  Surgeon: [name], MD;  Location: [facility];  Service: Orthopedics;  Laterality: Left;        Home Medications    Prior to Admission medications   Medication Sig Start Date End Date Taking? Authorizing Provider  apixaban (ELIQUIS) 5 MG TABS tablet Take 1 tablet (5 mg total) by mouth 2 (two) times daily. 11/02/18   Burnell Blanks, MD  diltiazem (CARDIZEM CD) 120 MG 24 hr capsule Take 1 capsule (120 mg total) by mouth daily. 10/23/18   Burnell Blanks, MD  FLUoxetine (PROZAC) 20 MG capsule Take 20 mg by mouth daily.    [provider]  furosemide (LASIX) 20 MG tablet Take 1 tablet (20 mg total) by mouth daily. You may take an extra 20 mg tablet for increased swelling or weight gain 10/23/18   Burnell Blanks, MD  glimepiride (AMARYL) 4 MG tablet Take 4 mg by mouth daily with breakfast.    [provider]  mesalamine (APRISO) 0.375 g 24 hr capsule Take 750 mg by mouth 2 (two) times daily.     [provider]  metFORMIN (GLUCOPHAGE) 500 MG tablet Take 1,000 mg by mouth 2 (two) times daily with a meal.     [provider]  metoprolol (TOPROL-XL) 200 MG 24 hr tablet Take 1 tablet (200 mg total) by mouth daily. Take with or immediately following a meal. 10/23/18   Burnell Blanks, MD  Multiple Vitamin  (MULTIVITAMIN WITH MINERALS) TABS tablet Take 1 tablet by mouth daily.    [provider]  rosuvastatin (CRESTOR) 5 MG tablet Take 1 tablet (5 mg total) by mouth daily. 07/10/18 10/08/18  Imogene Burn, PA-C  sitaGLIPtin (JANUVIA) 100 MG tablet Take 100 mg by mouth daily.    [provider]  tamsulosin (FLOMAX) 0.4 MG CAPS capsule Take 1 capsule (0.4 mg total) by mouth 2 times daily at 12 noon and 4 pm. 03/31/18   Love, Ivan Anchors, PA-C  trimethoprim (TRIMPEX) 100 MG tablet Take 100 mg by mouth daily. 08/02/18   [provider]    Family History Family History  Problem Relation Age of Onset  . Hypertension Father     Social History Social History   Tobacco Use  . Smoking status: Former Smoker    Packs/day: 1.00  Types: Cigarettes    Start date: 1963    Last attempt to quit: 1985    Years since quitting: 35.2  . Smokeless tobacco: Never Used  Substance Use Topics  . Alcohol use: Yes    Alcohol/week: 4.0 standard drinks    Types: 4 Cans of beer per week    Comment: social   . Drug use: Never     Allergies   Sulfa antibiotics   Review of Systems Review of Systems All other systems reviewed and are negative except that which was mentioned in HPI   Physical Exam Updated Vital Signs BP 136/86   Pulse 97   Temp 98.4 F (36.9 C) (Oral)   Resp 18   Ht 6\' 1"  (1.854 m)   Wt 97.1 kg   SpO2 96%   BMI 28.23 kg/m   Physical Exam Vitals signs and nursing note reviewed.  Constitutional:      General: He is not in acute distress.    Appearance: He is well-developed.  HENT:     Head: Normocephalic and atraumatic.     Nose: Nose normal.     Mouth/Throat:     Mouth: Mucous membranes are moist.  Eyes:     Conjunctiva/sclera: Conjunctivae normal.  Neck:     Musculoskeletal: Neck supple.  Cardiovascular:     Rate and Rhythm: Normal rate and regular rhythm.     Heart sounds: Normal heart sounds. No murmur.  Pulmonary:     Effort: Pulmonary  effort is normal.     Breath sounds: Normal breath sounds.  Abdominal:     General: Bowel sounds are normal. There is no distension.     Palpations: Abdomen is soft.     Tenderness: There is no abdominal tenderness.  Skin:    General: Skin is warm and dry.  Neurological:     Mental Status: He is alert and oriented to person, place, and time.     Comments: Fluent speech  Psychiatric:        Judgment: Judgment normal.      ED Treatments / Results  Labs (all labs ordered are listed, but only abnormal results are displayed) Labs Reviewed  BASIC METABOLIC PANEL - Abnormal; Notable for the following components:      Result Value   Sodium 129 (*)    Chloride 94 (*)    Glucose, Bld 398 (*)    BUN 34 (*)    Creatinine, Ser 2.24 (*)    GFR calc non Af Amer 28 (*)    GFR calc Af Amer 32 (*)    All other components within normal limits  URINALYSIS, ROUTINE W REFLEX MICROSCOPIC - Abnormal; Notable for the following components:   APPearance CLOUDY (*)    Glucose, UA >=500 (*)    Hgb urine dipstick MODERATE (*)    Protein, ur 30 (*)    Leukocytes,Ua MODERATE (*)    All other components within normal limits  URINALYSIS, MICROSCOPIC (REFLEX) - Abnormal; Notable for the following components:   Bacteria, UA FEW (*)    All other components within normal limits  CBG MONITORING, ED - Abnormal; Notable for the following components:   Glucose-Capillary 441 (*)    All other components within normal limits  POCT I-STAT EG7 - Abnormal; Notable for the following components:   pO2, Ven 26.0 (*)    Sodium 132 (*)    All other components within normal limits  URINE CULTURE  CBC  I-STAT VENOUS BLOOD GAS, ED  EKG None  Radiology No results found.  Procedures Procedures (including critical care time)  Medications Ordered in ED Medications  sodium chloride 0.9 % bolus 500 mL (has no administration in time range)  cefTRIAXone (ROCEPHIN) 1 g in sodium chloride 0.9 % 100 mL IVPB (has no  administration in time range)  sodium chloride 0.9 % bolus 1,000 mL (1,000 mLs Intravenous New Bag/Given 12/15/18 2211)  insulin regular (NOVOLIN R,HUMULIN R) 100 units/mL injection 4 Units (4 Units Subcutaneous Given 12/15/18 2212)     Initial Impression / Assessment and Plan / ED Course  I have reviewed the triage vital signs and the nursing notes.  Pertinent labs & imaging results that were available during my care of the patient were reviewed by me and considered in my medical decision making (see chart for details).       Comfortable, nontoxic on exam with stable vital signs.  Denying any complaints of pain.  Venous blood gas reassuring.  Lab work shows glucose 398 with normal anion gap, BUN 34, creatinine 2.24 which has jumped from previous values around 1.4-1.6.  CBC normal.  UA does suggest infection with moderate leukocytes, some RBCs, large amount of WBCs.  Added urine culture and gave ceftriaxone.  Gave the patient a small amount of insulin subcutaneous, lab work is reassuring against DKA.  I have ordered 1.5 L IVF bolus.  Concerned about his acute on chronic kidney injury and hyperglycemia in the setting of infection. Spoke with [facility], Dr. Blaine Hamper, and pt transferred to [facility] for further w/u and treatment.  Final Clinical Impressions(s) / ED Diagnoses   Final diagnoses:  AKI (acute kidney injury) ([place])  Hyperglycemia  Urinary tract infection with hematuria, site unspecified    ED Discharge Orders    None       Little, Wenda Overland, MD 12/15/18 2251

## 2018-12-15 NOTE — ED Notes (Addendum)
Pt's wife's contact info: Kwamane Whack [phone]

## 2018-12-15 NOTE — Care Management (Signed)
This is a no charge note  Transfer from [facility] per Dr. Rex Kras  78 year old man with a past medical history of hypertension, hyperlipidemia, diabetes mellitus, stroke, depression, atrial fibrillation on Eliquis, CKD-3, former smoker, BPH, dCHF, who presents with elevated blood sugar, increased urinary frequency.  Pt states he was switched off of metformin since february and put on trulicity, but his sugars have been elevated,350-500.  Patient has generalized weakness, increased urinary frequency. No upper respiratory symptoms.  Patient was found to have positive urinalysis (cloudy appearance, moderate amount of leukocyte, few bacteria, WBC>50), worsening renal function, WBC 10.1, blood sugar 398, but anion gap 10, no DKA.  Temperature normal, heart rate in 90s, oxygen saturation 96% on room air.  Patient is placed on telemetry bed for observation. Pt was given Rocephin.   Please call manager of [facility] hospitalists at [phone] when pt arrives to floor   Ivor Costa, MD  [facility]   If 7PM-7AM, please contact night-coverage [email or web address] Password [facility] 12/15/2018, 11:29 PM

## 2018-12-16 ENCOUNTER — Observation Stay (HOSPITAL_COMMUNITY): Payer: Medicare Other

## 2018-12-16 ENCOUNTER — Other Ambulatory Visit: Payer: Self-pay

## 2018-12-16 DIAGNOSIS — R338 Other retention of urine: Secondary | ICD-10-CM

## 2018-12-16 DIAGNOSIS — R739 Hyperglycemia, unspecified: Secondary | ICD-10-CM | POA: Diagnosis not present

## 2018-12-16 DIAGNOSIS — N390 Urinary tract infection, site not specified: Secondary | ICD-10-CM | POA: Diagnosis present

## 2018-12-16 DIAGNOSIS — Z7984 Long term (current) use of oral hypoglycemic drugs: Secondary | ICD-10-CM | POA: Diagnosis not present

## 2018-12-16 DIAGNOSIS — E785 Hyperlipidemia, unspecified: Secondary | ICD-10-CM

## 2018-12-16 DIAGNOSIS — E1159 Type 2 diabetes mellitus with other circulatory complications: Secondary | ICD-10-CM

## 2018-12-16 DIAGNOSIS — I5032 Chronic diastolic (congestive) heart failure: Secondary | ICD-10-CM

## 2018-12-16 DIAGNOSIS — N183 Chronic kidney disease, stage 3 (moderate): Secondary | ICD-10-CM | POA: Diagnosis present

## 2018-12-16 DIAGNOSIS — I480 Paroxysmal atrial fibrillation: Secondary | ICD-10-CM | POA: Diagnosis present

## 2018-12-16 DIAGNOSIS — N401 Enlarged prostate with lower urinary tract symptoms: Secondary | ICD-10-CM | POA: Diagnosis present

## 2018-12-16 DIAGNOSIS — I130 Hypertensive heart and chronic kidney disease with heart failure and stage 1 through stage 4 chronic kidney disease, or unspecified chronic kidney disease: Secondary | ICD-10-CM | POA: Diagnosis present

## 2018-12-16 DIAGNOSIS — Z7901 Long term (current) use of anticoagulants: Secondary | ICD-10-CM | POA: Diagnosis not present

## 2018-12-16 DIAGNOSIS — Z79899 Other long term (current) drug therapy: Secondary | ICD-10-CM | POA: Diagnosis not present

## 2018-12-16 DIAGNOSIS — N179 Acute kidney failure, unspecified: Secondary | ICD-10-CM | POA: Diagnosis present

## 2018-12-16 DIAGNOSIS — Z8673 Personal history of transient ischemic attack (TIA), and cerebral infarction without residual deficits: Secondary | ICD-10-CM | POA: Diagnosis not present

## 2018-12-16 DIAGNOSIS — I10 Essential (primary) hypertension: Secondary | ICD-10-CM

## 2018-12-16 DIAGNOSIS — N3000 Acute cystitis without hematuria: Secondary | ICD-10-CM | POA: Diagnosis not present

## 2018-12-16 DIAGNOSIS — Z89422 Acquired absence of other left toe(s): Secondary | ICD-10-CM | POA: Diagnosis not present

## 2018-12-16 DIAGNOSIS — E860 Dehydration: Secondary | ICD-10-CM | POA: Diagnosis present

## 2018-12-16 DIAGNOSIS — Z87891 Personal history of nicotine dependence: Secondary | ICD-10-CM | POA: Diagnosis not present

## 2018-12-16 DIAGNOSIS — E1165 Type 2 diabetes mellitus with hyperglycemia: Secondary | ICD-10-CM | POA: Diagnosis present

## 2018-12-16 DIAGNOSIS — A419 Sepsis, unspecified organism: Secondary | ICD-10-CM | POA: Diagnosis present

## 2018-12-16 DIAGNOSIS — R319 Hematuria, unspecified: Secondary | ICD-10-CM | POA: Diagnosis present

## 2018-12-16 DIAGNOSIS — E1122 Type 2 diabetes mellitus with diabetic chronic kidney disease: Secondary | ICD-10-CM | POA: Diagnosis present

## 2018-12-16 DIAGNOSIS — Z8249 Family history of ischemic heart disease and other diseases of the circulatory system: Secondary | ICD-10-CM | POA: Diagnosis not present

## 2018-12-16 DIAGNOSIS — N189 Chronic kidney disease, unspecified: Secondary | ICD-10-CM | POA: Diagnosis not present

## 2018-12-16 DIAGNOSIS — K5190 Ulcerative colitis, unspecified, without complications: Secondary | ICD-10-CM | POA: Diagnosis present

## 2018-12-16 LAB — CREATININE, URINE, RANDOM: Creatinine, Urine: 59.19 mg/dL

## 2018-12-16 LAB — CBG MONITORING, ED: Glucose-Capillary: 293 mg/dL — ABNORMAL HIGH (ref 70–99)

## 2018-12-16 LAB — BASIC METABOLIC PANEL
Anion gap: 9 (ref 5–15)
BUN: 26 mg/dL — ABNORMAL HIGH (ref 8–23)
CO2: 24 mmol/L (ref 22–32)
Calcium: 8.9 mg/dL (ref 8.9–10.3)
Chloride: 102 mmol/L (ref 98–111)
Creatinine, Ser: 1.96 mg/dL — ABNORMAL HIGH (ref 0.61–1.24)
GFR calc Af Amer: 38 mL/min — ABNORMAL LOW (ref >60)
GFR calc non Af Amer: 32 mL/min — ABNORMAL LOW (ref >60)
Glucose, Bld: 224 mg/dL — ABNORMAL HIGH (ref 70–99)
Potassium: 4.3 mmol/L (ref 3.5–5.1)
Sodium: 135 mmol/L (ref 135–145)

## 2018-12-16 LAB — GLUCOSE, CAPILLARY
Glucose-Capillary: 221 mg/dL — ABNORMAL HIGH (ref 70–99)
Glucose-Capillary: 192 mg/dL — ABNORMAL HIGH (ref 70–99)
Glucose-Capillary: 320 mg/dL — ABNORMAL HIGH (ref 70–99)
Glucose-Capillary: 222 mg/dL — ABNORMAL HIGH (ref 70–99)
Glucose-Capillary: 185 mg/dL — ABNORMAL HIGH (ref 70–99)

## 2018-12-16 LAB — NA AND K (SODIUM & POTASSIUM), RAND UR
Potassium Urine: 20 mmol/L
Sodium, Ur: 50 mmol/L

## 2018-12-16 LAB — TSH: TSH: 3.488 u[IU]/mL (ref 0.350–4.500)

## 2018-12-16 MED ORDER — METOPROLOL SUCCINATE ER 100 MG PO TB24
200.0000 mg | ORAL_TABLET | Freq: Every day | ORAL | Status: DC
  Administered 2018-12-17 – 2018-12-19 (×3): 200 mg via ORAL
  Filled 2018-12-16 (×4): qty 2

## 2018-12-16 MED ORDER — SODIUM CHLORIDE 0.9 % IV SOLN: INTRAVENOUS | Status: DC

## 2018-12-16 MED ORDER — MESALAMINE ER 0.375 G PO CP24
0.7500 g | ORAL_CAPSULE | Freq: Two times a day (BID) | ORAL | Status: DC
  Administered 2018-12-16 – 2018-12-17 (×3): 0.75 g via ORAL
  Filled 2018-12-16 (×6): qty 2

## 2018-12-16 MED ORDER — ACETAMINOPHEN 325 MG PO TABS: 650.0000 mg | ORAL_TABLET | Freq: Four times a day (QID) | ORAL | Status: DC | PRN

## 2018-12-16 MED ORDER — TAMSULOSIN HCL 0.4 MG PO CAPS
0.4000 mg | ORAL_CAPSULE | Freq: Two times a day (BID) | ORAL | Status: DC
  Administered 2018-12-16 – 2018-12-19 (×6): 0.4 mg via ORAL
  Filled 2018-12-16 (×7): qty 1

## 2018-12-16 MED ORDER — INSULIN STARTER KIT- PEN NEEDLES (ENGLISH)
1.0000 | Freq: Once | Status: AC
  Administered 2018-12-16: 1

## 2018-12-16 MED ORDER — FLUOXETINE HCL 20 MG PO CAPS
20.0000 mg | ORAL_CAPSULE | Freq: Every day | ORAL | Status: DC
  Administered 2018-12-16 – 2018-12-19 (×4): 20 mg via ORAL
  Filled 2018-12-16 (×4): qty 1

## 2018-12-16 MED ORDER — SODIUM CHLORIDE 0.9 % IV SOLN
1.0000 g | INTRAVENOUS | Status: DC
  Administered 2018-12-16 – 2018-12-18 (×3): 1 g via INTRAVENOUS
  Filled 2018-12-16 (×4): qty 10

## 2018-12-16 MED ORDER — ADULT MULTIVITAMIN W/MINERALS CH
1.0000 | ORAL_TABLET | Freq: Every day | ORAL | Status: DC
  Administered 2018-12-16 – 2018-12-19 (×4): 1 via ORAL
  Filled 2018-12-16 (×4): qty 1

## 2018-12-16 MED ORDER — ACETAMINOPHEN 650 MG RE SUPP: 650.0000 mg | Freq: Four times a day (QID) | RECTAL | Status: DC | PRN

## 2018-12-16 MED ORDER — DILTIAZEM HCL ER COATED BEADS 120 MG PO CP24
120.0000 mg | ORAL_CAPSULE | Freq: Every day | ORAL | Status: DC
  Administered 2018-12-16 – 2018-12-19 (×4): 120 mg via ORAL
  Filled 2018-12-16 (×4): qty 1

## 2018-12-16 MED ORDER — ONDANSETRON HCL 4 MG PO TABS: 4.0000 mg | ORAL_TABLET | Freq: Four times a day (QID) | ORAL | Status: DC | PRN

## 2018-12-16 MED ORDER — APIXABAN 5 MG PO TABS
5.0000 mg | ORAL_TABLET | Freq: Two times a day (BID) | ORAL | Status: DC
  Administered 2018-12-16 – 2018-12-19 (×7): 5 mg via ORAL
  Filled 2018-12-16 (×7): qty 1

## 2018-12-16 MED ORDER — ONDANSETRON HCL 4 MG/2ML IJ SOLN: 4.0000 mg | Freq: Four times a day (QID) | INTRAMUSCULAR | Status: DC | PRN

## 2018-12-16 MED ORDER — INSULIN GLARGINE 100 UNIT/ML ~~LOC~~ SOLN
10.0000 [IU] | Freq: Two times a day (BID) | SUBCUTANEOUS | Status: DC
  Administered 2018-12-16 – 2018-12-17 (×3): 10 [IU] via SUBCUTANEOUS
  Filled 2018-12-16 (×4): qty 0.1

## 2018-12-16 MED ORDER — INSULIN GLARGINE 100 UNIT/ML ~~LOC~~ SOLN
10.0000 [IU] | Freq: Every day | SUBCUTANEOUS | Status: DC
  Administered 2018-12-16: 10 [IU] via SUBCUTANEOUS
  Filled 2018-12-16: qty 0.1

## 2018-12-16 MED ORDER — INSULIN ASPART 100 UNIT/ML ~~LOC~~ SOLN
0.0000 [IU] | Freq: Three times a day (TID) | SUBCUTANEOUS | Status: DC
  Administered 2018-12-16: 3 [IU] via SUBCUTANEOUS
  Administered 2018-12-16: 11 [IU] via SUBCUTANEOUS
  Administered 2018-12-16: 5 [IU] via SUBCUTANEOUS
  Administered 2018-12-17: 3 [IU] via SUBCUTANEOUS
  Administered 2018-12-17 (×2): 5 [IU] via SUBCUTANEOUS
  Administered 2018-12-18 (×2): 3 [IU] via SUBCUTANEOUS
  Administered 2018-12-18: 5 [IU] via SUBCUTANEOUS
  Administered 2018-12-19: 2 [IU] via SUBCUTANEOUS

## 2018-12-16 MED ORDER — SODIUM CHLORIDE 0.9 % IV SOLN
INTRAVENOUS | Status: DC
  Administered 2018-12-16 – 2018-12-18 (×6): via INTRAVENOUS

## 2018-12-16 NOTE — Plan of Care (Signed)
  Problem: Education: Goal: Knowledge of General Education information will improve Description: Including pain rating scale, medication(s)/side effects and non-pharmacologic comfort measures Outcome: Progressing   Problem: Health Behavior/Discharge Planning: Goal: Ability to manage health-related needs will improve Outcome: Progressing   Problem: Clinical Measurements: Goal: Will remain free from infection Outcome: Progressing   

## 2018-12-16 NOTE — Progress Notes (Addendum)
Inpatient Diabetes Program Recommendations  AACE/ADA: New Consensus Statement on Inpatient Glycemic Control (2015)  Target Ranges:  Prepandial:   less than 140 mg/dL      Peak postprandial:   less than 180 mg/dL (1-2 hours)      Critically ill patients:  140 - 180 mg/dL   Results for [name], Kevin DUANE "JONI NORROD" (MRN [id number]) as of 12/16/2018 10:28  Ref. Range 12/15/2018 21:16 12/16/2018 00:23 12/16/2018 03:00 12/16/2018 06:44  Glucose-Capillary Latest Ref Range: 70 - 99 mg/dL 441 (H)  4 units REGULAR given at 10:12pm 293 (H) 221 (H)    10 units LANTUS 192 (H)  3 units NOVOLOG    Results for [name], Kevin DUANE "ALAA EYERMAN" (MRN [id number]) as of 12/16/2018 10:28  Ref. Range 03/17/2018 03:39  Hemoglobin A1C Latest Ref Range: 4.8 - 5.6 % 7.6 (H)    Admit with: AKI on CKD stage 3 - vs progression of CKD 3/ Hyperglycemia  History: DM  Home DM Meds: Amaryl 4 mg Daily       Metformin 1000 mg BID (NOT taking)       Januvia 100 mg Daily  Current Orders: Lantus 10 units QHS      Novolog Moderate Correction Scale/ SSI (0-15 units) TID AC     PCP: Dr. [name] Sadie [place]er)  Per MD H&P notes this admission: Patient was taken off of metformin and put on Trulicity in February.  Since this time CBGs have been elevated and running 350-500.  Patient presents to ED with progressively worsening generalized weakness, increased thirst, increased urinary frequency.  Note Lantus 10 units QHS started last PM.  CBG down to 192 this AM.  DM Coordinator not physically present on campus over the weekend for teaching, however, we are available for insulin recommendations.  Will ask nursing staff to begin insulin education with pt and wife.  Education should be quick as patient has been taking Trulicity injections at home since February.     MD- If you decide to discharge patient on insulin, please give rxs for Insulin pens and Insulin pen needles.  May want to send patient home on  Lantus QHS +  Would continue Amaryl and Januvia as well.    Lantus Order # [id number] Insulin Pen Needles- Order # [id number]  Also needs updated A1c--Last one done was June 2019    --Will follow patient during hospitalization--  Wyn Quaker RN, MSN, CDE Diabetes Coordinator Inpatient Glycemic Control Team Team Pager: [phone] (8a-5p)

## 2018-12-16 NOTE — Progress Notes (Signed)
Paged MD regarding pt scheduled for metoprolol and BP 77/63 Awaiting call back  Pt non symptomatic

## 2018-12-16 NOTE — H&P (Signed)
History and Physical    Kevin [name] HQI:[id number] DOB: 09-Jul-1941 DOA: 12/15/2018  PCP: [name], MD  Patient coming from: Home  I have personally briefly reviewed patient's old medical records in [place]  Chief Complaint: Urinary frequency, hyperglycemia  HPI: Kevin [name] is a 78 y.o. male with medical history significant of DM2, PAF on eliquis, HTN, CKD stage 3.  Patient was taken off of metformin and put on trulicity in Feb.  Since this time BGLs have ben elevated and running 350-500.  Patient presents to ED with progressively worsening generalized weakness, increased thirst, increased urinary frequency.   ED Course: Found also to have possible UTI with mod LE and many WBC.  BGL 400 (was 500 in office today), AG 10 and no acidosis.  Started on rocephin and hospitalist asked to admit.  Denies cough, fever, URI symptoms, urinary retention or difficulty going.   Review of Systems: As per HPI otherwise 10 point review of systems negative.   Past Medical History:  Diagnosis Date  . A-fib ([place]) 03/20/2018  . Acute blood loss anemia   . Benign essential HTN   . Benign prostatic hyperplasia with urinary retention   . Cerebrovascular accident (CVA) ([place])   . CKD (chronic kidney disease), stage III ([place])   . Diabetes ([place]) 03/20/2018  . Diabetes mellitus type 2 in nonobese (HCC)   . Essential hypertension 03/20/2018  . Former tobacco use   . H/O ulcerative colitis 1993  . Hypertension   . Hypokalemia   . Leukocytosis   . New onset atrial fibrillation ([place])   . Osteomyelitis ([place])    a. 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  [name] (paroxysmal atrial fibrillation) ([place])    a. dx 03/2018 in setting of stroke, osteomyelitis.  . Sepsis ([place]) 03/20/2018  . Stage 3 chronic kidney disease ([place])    pt/family unaware  . 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 Stroke ([place]) 03/2018  . Stroke-like episode ([place]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 03/16/2018  .  Tachycardia 03/20/2018  . Wrist fracture    Left    Past Surgical History:  Procedure Laterality Date  . AMPUTATION Left 03/24/2018   Procedure: Transmetatarsal Amputation Left Foot;  Surgeon: [name], MD;  Location: [facility];  Service: Orthopedics;  Laterality: Left;  . CARDIOVERSION N/A 05/05/2018   Procedure: CARDIOVERSION;  Surgeon: [name], MD;  Location: [facility] ENDOSCOPY;  Service: Cardiovascular;  Laterality: N/A;  . COLONOSCOPY  12/2017   benign, remission from ulcerative colitis in 90s  . I&D EXTREMITY Left 03/17/2018   Procedure: IRRIGATION AND DEBRIDEMENT FOOT;  Surgeon: [name], MD;  Location: [facility];  Service: Orthopedics;  Laterality: Left;  . STUMP REVISION Left 05/10/2018   Procedure: REVISION LEFT TRANSMETATARSAL AMPUTATION;  Surgeon: [name], MD;  Location: [facility];  Service: Orthopedics;  Laterality: Left;     reports that he quit smoking about 35 years ago. His smoking use included cigarettes. He started smoking about 57 years ago. He smoked 1.00 pack per day. He has never used smokeless tobacco. He reports current alcohol use of about 4.0 standard drinks of alcohol per week. He reports that he does not use drugs.  Allergies  Allergen Reactions  . Sulfa Antibiotics Rash    Family History  Problem Relation Age of Onset  . Hypertension Father      Prior to Admission medications   Medication Sig Start Date End Date Taking? Authorizing Provider  apixaban (  ELIQUIS) 5 MG TABS tablet Take 1 tablet (5 mg total) by mouth 2 (two) times daily. 11/02/18   Burnell Blanks, MD  diltiazem (CARDIZEM CD) 120 MG 24 hr capsule Take 1 capsule (120 mg total) by mouth daily. 10/23/18   Burnell Blanks, MD  FLUoxetine (PROZAC) 20 MG capsule Take 20 mg by mouth daily.    [provider]  furosemide (LASIX) 20 MG tablet Take 1 tablet (20 mg total) by mouth daily. You may take an extra 20 mg tablet for increased swelling or weight gain  10/23/18   Burnell Blanks, MD  glimepiride (AMARYL) 4 MG tablet Take 4 mg by mouth daily with breakfast.    [provider]  mesalamine (APRISO) 0.375 g 24 hr capsule Take 750 mg by mouth 2 (two) times daily.     [provider]  metFORMIN (GLUCOPHAGE) 500 MG tablet Take 1,000 mg by mouth 2 (two) times daily with a meal.     [provider]  metoprolol (TOPROL-XL) 200 MG 24 hr tablet Take 1 tablet (200 mg total) by mouth daily. Take with or immediately following a meal. 10/23/18   Burnell Blanks, MD  Multiple Vitamin (MULTIVITAMIN WITH MINERALS) TABS tablet Take 1 tablet by mouth daily.    [provider]  rosuvastatin (CRESTOR) 5 MG tablet Take 1 tablet (5 mg total) by mouth daily. 07/10/18 10/08/18  Imogene Burn, PA-C  sitaGLIPtin (JANUVIA) 100 MG tablet Take 100 mg by mouth daily.    [provider]  tamsulosin (FLOMAX) 0.4 MG CAPS capsule Take 1 capsule (0.4 mg total) by mouth 2 times daily at 12 noon and 4 pm. 03/31/18   Love, Ivan Anchors, PA-C  trimethoprim (TRIMPEX) 100 MG tablet Take 100 mg by mouth daily. 08/02/18   [provider]    Physical Exam: Vitals:   12/15/18 2113 12/15/18 2116 12/16/18 0041  BP:  136/86 108/61  Pulse: 97  90  Resp: 18  20  Temp: 98.4 F (36.9 C)    TempSrc: Oral    SpO2: 96%  97%  Weight:  97.1 kg   Height:  6\' 1"  (1.854 m)     Constitutional: NAD, calm, comfortable Eyes: PERRL, lids and conjunctivae normal ENMT: Mucous membranes are moist. Posterior pharynx clear of any exudate or lesions.Normal dentition.  Neck: normal, supple, no masses, no thyromegaly Respiratory: clear to auscultation bilaterally, no wheezing, no crackles. Normal respiratory effort. No accessory muscle use.  Cardiovascular: Regular rate and rhythm, no murmurs / rubs / gallops. No extremity edema. 2+ pedal pulses. No carotid bruits.  Abdomen: no tenderness, no masses palpated. No hepatosplenomegaly. Bowel sounds  positive.  Musculoskeletal: no clubbing / cyanosis. No joint deformity upper and lower extremities. Good ROM, no contractures. Normal muscle tone.  Skin: no rashes, lesions, ulcers. No induration Neurologic: CN 2-12 grossly intact. Sensation intact, DTR normal. Strength 5/5 in all 4.  Psychiatric: Normal judgment and insight. Alert and oriented x 3. Normal mood.    Labs on Admission: I have personally reviewed following labs and imaging studies  CBC: Recent Labs  Lab 12/15/18 2132 12/15/18 2133  WBC 10.1  --   HGB 13.6 13.9  HCT 41.1 41.0  MCV 90.3  --   PLT 326  --    Basic Metabolic Panel: Recent Labs  Lab 12/15/18 2132 12/15/18 2133  NA 129* 132*  K 4.4 4.5  CL 94*  --   CO2 25  --   GLUCOSE  398*  --   BUN 34*  --   CREATININE 2.24*  --   CALCIUM 9.3  --    GFR: Estimated Creatinine Clearance: 35 mL/min (A) (by C-G formula based on SCr of 2.24 mg/dL (H)). Liver Function Tests: No results for input(s): AST, ALT, ALKPHOS, BILITOT, PROT, ALBUMIN in the last 168 hours. No results for input(s): LIPASE, AMYLASE in the last 168 hours. No results for input(s): AMMONIA in the last 168 hours. Coagulation Profile: No results for input(s): INR, PROTIME in the last 168 hours. Cardiac Enzymes: No results for input(s): CKTOTAL, CKMB, CKMBINDEX, TROPONINI in the last 168 hours. BNP (last 3 results) Recent Labs    04/27/18 1236  PROBNP 3,814*   HbA1C: No results for input(s): HGBA1C in the last 72 hours. CBG: Recent Labs  Lab 12/15/18 2116 12/16/18 0023 12/16/18 0300  GLUCAP 441* 293* 221*   Lipid Profile: No results for input(s): CHOL, HDL, LDLCALC, TRIG, CHOLHDL, LDLDIRECT in the last 72 hours. Thyroid Function Tests: No results for input(s): TSH, T4TOTAL, FREET4, T3FREE, THYROIDAB in the last 72 hours. Anemia Panel: No results for input(s): VITAMINB12, FOLATE, FERRITIN, TIBC, IRON, RETICCTPCT in the last 72 hours. Urine analysis:    Component Value Date/Time    COLORURINE YELLOW 12/15/2018 2132   APPEARANCEUR CLOUDY (A) 12/15/2018 2132   LABSPEC 1.010 12/15/2018 2132   PHURINE 6.0 12/15/2018 2132   GLUCOSEU >=500 (A) 12/15/2018 2132   HGBUR MODERATE (A) 12/15/2018 2132   BILIRUBINUR NEGATIVE 12/15/2018 2132   Emma NEGATIVE 12/15/2018 2132   PROTEINUR 30 (A) 12/15/2018 2132   NITRITE NEGATIVE 12/15/2018 2132   LEUKOCYTESUR MODERATE (A) 12/15/2018 2132    Radiological Exams on Admission: No results found.  EKG: Independently reviewed.  Assessment/Plan Principal Problem:   Acute renal failure superimposed on stage 3 chronic kidney disease (HCC) Active Problems:   Essential hypertension   Paroxysmal atrial fibrillation (HCC)   Benign prostatic hyperplasia with urinary retention   Type 2 diabetes mellitus with vascular disease (HCC)   UTI (urinary tract infection)   Chronic diastolic CHF (congestive heart failure) (HCC)   HLD (hyperlipidemia)    1. AKI on CKD stage 3 - vs progression of CKD 3 1. Urine lytes 2. IVF: 1.5L NS in ED 3. Hold lasix 4. Repeat BMP in AM 5. B renal US to r/o obstruction, does have h/o BPH in past 2. Uncontrolled DM2 - 1. Hold home PO hypoglycemics 2. Lantus 10u QHS first dose now 3. Mod scale SSI AC 4. DM coordinator consult, suspect above will need to be adjusted 3. Area of callus / concern for pre-ulceration near amputation stump - 1. Will get wound care to take a look at this 2. But doesn't look infected or even like an open wound at this point 4. HTN - continue home BP meds 5. HLD - continue statin 6. Chronic diastolic CHF - holding lasix as above 7. PAF - 1. Continue cardizem, beta blocker, and eliquis 2. Pharm to make any needed adjustments to eliquis dose (if any needed) 8. UC - continue melsalamine  DVT prophylaxis: Eliquis Code Status: Full Family Communication: No family in room Disposition Plan: Home after admit Consults called: None Admission status: Place in 35     [place], [place] Hospitalists  How to contact the [facility] Attending or Consulting provider [place] or covering provider during after hours [place], for this patient?  1. Check the care team in [facility] and look for a) attending/consulting  [place] provider listed and b) the [facility] team listed 2. Log into [email or web address]  Amion Physician Scheduling and messaging for groups and whole hospitals  On call and physician scheduling software for group practices, residents, hospitalists and other medical providers for call, clinic, rotation and shift schedules. OnCall Enterprise is a hospital-wide system for scheduling doctors and paging doctors on call. EasyPlot is for scientific plotting and data analysis.  [email or web address]  and use [place]'s universal password to access. If you do not have the password, please contact the hospital operator.  3. Locate the [facility] provider you are looking for under [facility] and page to a number that you can be directly reached. 4. If you still have difficulty reaching the provider, please page the [facility] (Director on Call) for the Hospitalists listed on amion for assistance.  12/16/2018, 3:17 AM

## 2018-12-16 NOTE — ED Notes (Signed)
ED TO INPATIENT HANDOFF REPORT  ED Nurse Name and Phone #: Marisa Hua RN [phone]  S Name/Age/Gender Kevin [name] 78 y.o. male Room/Bed: MH01/MH01  Code Status   Code Status: Prior  Home/SNF/Other Admission  Alert and orient times 4 Is this baseline? Yes  Triage Complete: Triage complete  Chief Complaint HIGH BLOOD SUGAR  Triage Note Pt states he was switched off of metformin since february and put on trulicity and sugars have been 350-480 and today at [facility] it was greater than 500's.   Allergies Allergies  Allergen Reactions  . Sulfa Antibiotics Rash    Level of Care/Admitting Diagnosis ED Disposition    ED Disposition Condition Comment   Admit  Hospital Area: [facility] [[id number]]  Level of Care: Telemetry Medical [104]  I expect the patient will be discharged within 24 hours: No (not a candidate for 5C-Observation unit)  Diagnosis: UTI (urinary tract infection) [681157]  Admitting Physician: Ivor Costa [[id number]]  Attending Physician: Ivor Costa [[id number]]  PT Class (Do Not Modify): Observation [104]  PT Acc Code (Do Not Modify): Observation [10022]       B Medical/Surgery History Past Medical History:  Diagnosis Date  . A-fib ([place]) 03/20/2018  . Acute blood loss anemia   . Benign essential HTN   . Benign prostatic hyperplasia with urinary retention   . Cerebrovascular accident (CVA) ([place])   . CKD (chronic kidney disease), stage III ([place])   . Diabetes ([place]) 03/20/2018  . Diabetes mellitus type 2 in nonobese (HCC)   . Essential hypertension 03/20/2018  . Former tobacco use   . H/O ulcerative colitis 1993  . Hypertension   . Hypokalemia   . Leukocytosis   . New onset atrial fibrillation ([place])   . Osteomyelitis ([place])    a. 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  [name] (paroxysmal atrial fibrillation) ([place])    a. dx 03/2018 in setting of stroke, osteomyelitis.  . Sepsis ([place]) 03/20/2018  . Stage 3 chronic kidney disease ([place])     pt/family unaware  . 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 Stroke ([place]) 03/2018  . Stroke-like episode ([place]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 03/16/2018  . Tachycardia 03/20/2018  . Wrist fracture    Left   Past Surgical History:  Procedure Laterality Date  . AMPUTATION Left 03/24/2018   Procedure: Transmetatarsal Amputation Left Foot;  Surgeon: [name], MD;  Location: [facility];  Service: Orthopedics;  Laterality: Left;  . CARDIOVERSION N/A 05/05/2018   Procedure: CARDIOVERSION;  Surgeon: [name], MD;  Location: [facility] ENDOSCOPY;  Service: Cardiovascular;  Laterality: N/A;  . COLONOSCOPY  12/2017   benign, remission from ulcerative colitis in 90s  . I&D EXTREMITY Left 03/17/2018   Procedure: IRRIGATION AND DEBRIDEMENT FOOT;  Surgeon: [name], MD;  Location: [facility];  Service: Orthopedics;  Laterality: Left;  . STUMP REVISION Left 05/10/2018   Procedure: REVISION LEFT TRANSMETATARSAL AMPUTATION;  Surgeon: [name], MD;  Location: [facility];  Service: Orthopedics;  Laterality: Left;     A IV Location/Drains/Wounds Patient Lines/Drains/Airways Status   Active Line/Drains/Airways    Name:   Placement date:   Placement time:   Site:   Days:   Peripheral IV 12/15/18 Right Antecubital   12/15/18    2130    Antecubital   1   Negative Pressure Wound Therapy Foot Left   05/10/18    1134    -   220   Incision (Closed) 05/06/18  Foot Left   05/06/18    0600     224   Incision (Closed) 05/10/18 Leg Left   05/10/18    1144     220          Intake/Output Last 24 hours  Intake/Output Summary (Last 24 hours) at 12/16/2018 0053 Last data filed at 12/16/2018 0047 Gross per 24 hour  Intake 1670.53 ml  Output -  Net 1670.53 ml    Labs/Imaging Results for orders placed or performed during the hospital encounter of 12/15/18 (from the past 48 hour(s))  CBG monitoring, ED     Status: Abnormal   Collection Time: 12/15/18  9:16 PM  Result Value Ref Range   Glucose-Capillary  441 (H) 70 - 99 mg/dL  Basic metabolic panel     Status: Abnormal   Collection Time: 12/15/18  9:32 PM  Result Value Ref Range   Sodium 129 (L) 135 - 145 mmol/L   Potassium 4.4 3.5 - 5.1 mmol/L   Chloride 94 (L) 98 - 111 mmol/L   CO2 25 22 - 32 mmol/L   Glucose, Bld 398 (H) 70 - 99 mg/dL   BUN 34 (H) 8 - 23 mg/dL   Creatinine, Ser 2.24 (H) 0.61 - 1.24 mg/dL   Calcium 9.3 8.9 - 10.3 mg/dL   GFR calc non Af Amer 28 (L) >60 mL/min   GFR calc Af Amer 32 (L) >60 mL/min   Anion gap 10 5 - 15    Comment: Performed at [facility], [address] [place], [place] [address]  CBC     Status: None   Collection Time: 12/15/18  9:32 PM  Result Value Ref Range   WBC 10.1 4.0 - 10.5 K/uL   RBC 4.55 4.22 - 5.81 MIL/uL   Hemoglobin 13.6 13.0 - 17.0 g/dL   HCT 41.1 39.0 - 52.0 %   MCV 90.3 80.0 - 100.0 fL   MCH 29.9 26.0 - 34.0 pg   MCHC 33.1 30.0 - 36.0 g/dL   RDW 12.7 11.5 - 15.5 %   Platelets 326 150 - 400 K/uL   nRBC 0.0 0.0 - 0.2 %    Comment: Performed at [facility], [address] [place], [place] [address]  Urinalysis, Routine w reflex microscopic     Status: Abnormal   Collection Time: 12/15/18  9:32 PM  Result Value Ref Range   Color, Urine YELLOW YELLOW   APPearance CLOUDY (A) CLEAR   Specific Gravity, Urine 1.010 1.005 - 1.030   pH 6.0 5.0 - 8.0   Glucose, UA >=500 (A) NEGATIVE mg/dL   Hgb urine dipstick MODERATE (A) NEGATIVE   Bilirubin Urine NEGATIVE NEGATIVE   Ketones, ur NEGATIVE NEGATIVE mg/dL   Protein, ur 30 (A) NEGATIVE mg/dL   Nitrite NEGATIVE NEGATIVE   Leukocytes,Ua MODERATE (A) NEGATIVE    Comment: Performed at [facility], [address] [place], [place] [address]  Urinalysis, Microscopic (reflex)     Status: Abnormal   Collection Time: 12/15/18  9:32 PM  Result Value Ref Range   RBC / HPF 21-50 0 - 5 RBC/hpf   WBC, UA >50 0 - 5 WBC/hpf   Bacteria, UA FEW (A) NONE SEEN   Squamous Epithelial / LPF 0-5 0 - 5    Comment:  Performed at [facility], [address] [place], [place] [address]  POCT I-Stat EG7     Status: Abnormal  Collection Time: 12/15/18  9:33 PM  Result Value Ref Range   pH, Ven 7.383 7.250 - 7.430   pCO2, Ven 44.5 44.0 - 60.0 mmHg   pO2, Ven 26.0 (LL) 32.0 - 45.0 mmHg   Bicarbonate 26.5 20.0 - 28.0 mmol/L   TCO2 28 22 - 32 mmol/L   O2 Saturation 47.0 %   Acid-Base Excess 1.0 0.0 - 2.0 mmol/L   Sodium 132 (L) 135 - 145 mmol/L   Potassium 4.5 3.5 - 5.1 mmol/L   Calcium, Ion 1.18 1.15 - 1.40 mmol/L   HCT 41.0 39.0 - 52.0 %   Hemoglobin 13.9 13.0 - 17.0 g/dL   Patient temperature 98.4 F    Collection site IV START    Drawn by Nurse    Sample type VENOUS    Comment NOTIFIED PHYSICIAN   CBG monitoring, ED     Status: Abnormal   Collection Time: 12/16/18 12:23 AM  Result Value Ref Range   Glucose-Capillary 293 (H) 70 - 99 mg/dL   No results found.  Pending Labs Unresulted Labs (From admission, onward)    Start     Ordered   12/15/18 2226  Urine culture  ONCE - STAT,   STAT    Question:  Patient immune status  Answer:  Normal   12/15/18 2225          Vitals/Pain Today's Vitals   12/15/18 2113 12/15/18 2114 12/15/18 2116 12/16/18 0041  BP:   136/86 108/61  Pulse: 97   90  Resp: 18   20  Temp: 98.4 F (36.9 C)     TempSrc: Oral     SpO2: 96%   97%  Weight:   97.1 kg   Height:   6\' 1"  (1.854 m)   PainSc:  0-No pain      Isolation Precautions No active isolations  Medications Medications  sodium chloride 0.9 % bolus 1,000 mL ( Intravenous Stopped 12/15/18 2313)  sodium chloride 0.9 % bolus 500 mL ( Intravenous Stopped 12/16/18 0036)  insulin regular (NOVOLIN R,HUMULIN R) 100 units/mL injection 4 Units (4 Units Subcutaneous Given 12/15/18 2212)  cefTRIAXone (ROCEPHIN) 1 g in sodium chloride 0.9 % 100 mL IVPB (0 g Intravenous Stopped 12/15/18 2347)    Mobility walks Low fall risk   Focused Assessments   R Recommendations: See Admitting Provider  Note  Report given to: Zella Ball RN  Additional Notes:

## 2018-12-16 NOTE — Progress Notes (Addendum)
Patient admitted after midnight.  Agree with overall assessment and plan as seen on H&P except for as noted below.  Patient reports recently being discontinued off of metformin in February of this year.  Had been taken off Invokana for increased stroke risks after having a stroke in July 2019.  Patient currently taking Januvia, glimepiride, and Trulicity for diabetes management.  Blood sugars reported to be in the 300s on average.  Notes being more forgetful, fatigued easily, and complaining of lightheadedness with changes in position.  Thyroid studies relatively within normal limits.  With IV fluids kidney function improving back to baseline which previously have been around 1.2-1.6.  His last hemoglobin A1c reported to be 9.2 in February.  Currently on Lantus 10 units at bedtime.  Will place on Lantus 10 units twice daily as after breakfast and lunch blood sugars now back into the 300s.  Patient still very dehydrated as orthostatic vital signs were noted to be positive.  Continue IV fluids normal saline at 100 mL/h possible discharge home tomorrow if symptoms improve.  We will also need to follow-up urine culture continue Rocephin for now.

## 2018-12-16 NOTE — Progress Notes (Signed)
Report called to 5W, spoke to Earlie Server  Pt has all belongings  Pt transported via wheelchair with RN

## 2018-12-16 NOTE — Progress Notes (Signed)
ANTICOAGULATION CONSULT NOTE - Initial Consult  Pharmacy Consult for Eliquis Indication: atrial fibrillation  Allergies  Allergen Reactions  . Sulfa Antibiotics Rash    Patient Measurements: Height: 6\' 1"  (185.4 cm) Weight: 214 lb (97.1 kg) IBW/kg (Calculated) : 79.9  Vital Signs: Temp: 98.4 F (36.9 C) (03/20 2113) Temp Source: Oral (03/20 2113) BP: 108/61 (03/21 0041) Pulse Rate: 90 (03/21 0041)  Labs: Recent Labs    12/15/18 2132 12/15/18 2133  HGB 13.6 13.9  HCT 41.1 41.0  PLT 326  --   CREATININE 2.24*  --     Estimated Creatinine Clearance: 35 mL/min (A) (by C-G formula based on SCr of 2.24 mg/dL (H)).   Medical History: Past Medical History:  Diagnosis Date  . A-fib ([place]) 03/20/2018  . Acute blood loss anemia   . Benign essential HTN   . Benign prostatic hyperplasia with urinary retention   . Cerebrovascular accident (CVA) ([place])   . CKD (chronic kidney disease), stage III ([place])   . Diabetes ([place]) 03/20/2018  . Diabetes mellitus type 2 in nonobese (HCC)   . Essential hypertension 03/20/2018  . Former tobacco use   . H/O ulcerative colitis 1993  . Hypertension   . Hypokalemia   . Leukocytosis   . New onset atrial fibrillation ([place])   . Osteomyelitis ([place])    a. 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  [name] (paroxysmal atrial fibrillation) ([place])    a. dx 03/2018 in setting of stroke, osteomyelitis.  . Sepsis ([place]) 03/20/2018  . Stage 3 chronic kidney disease ([place])    pt/family unaware  . 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 Stroke ([place]) 03/2018  . Stroke-like episode ([place]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 03/16/2018  . Tachycardia 03/20/2018  . Wrist fracture    Left    Medications:  Medications Prior to Admission  Medication Sig Dispense Refill Last Dose  . apixaban (ELIQUIS) 5 MG TABS tablet Take 1 tablet (5 mg total) by mouth 2 (two) times daily. 60 tablet 1   . diltiazem (CARDIZEM CD) 120 MG 24 hr capsule Take 1 capsule (120 mg total) by  mouth daily. 90 capsule 2 Taking  . FLUoxetine (PROZAC) 20 MG capsule Take 20 mg by mouth daily.   Taking  . furosemide (LASIX) 20 MG tablet Take 1 tablet (20 mg total) by mouth daily. You may take an extra 20 mg tablet for increased swelling or weight gain 180 tablet 2 Taking  . glimepiride (AMARYL) 4 MG tablet Take 4 mg by mouth daily with breakfast.   Taking  . mesalamine (APRISO) 0.375 g 24 hr capsule Take 750 mg by mouth 2 (two) times daily.    Taking  . metFORMIN (GLUCOPHAGE) 500 MG tablet Take 1,000 mg by mouth 2 (two) times daily with a meal.    Taking  . metoprolol (TOPROL-XL) 200 MG 24 hr tablet Take 1 tablet (200 mg total) by mouth daily. Take with or immediately following a meal. 90 tablet 2 Taking  . Multiple Vitamin (MULTIVITAMIN WITH MINERALS) TABS tablet Take 1 tablet by mouth daily.   Taking  . rosuvastatin (CRESTOR) 5 MG tablet Take 1 tablet (5 mg total) by mouth daily. 90 tablet 3 Taking  . sitaGLIPtin (JANUVIA) 100 MG tablet Take 100 mg by mouth daily.   Taking  . tamsulosin (FLOMAX) 0.4 MG CAPS capsule Take 1 capsule (0.4 mg total) by mouth 2 times daily at 12 noon and 4 pm. 60 capsule 0 Taking  . trimethoprim (  TRIMPEX) 100 MG tablet Take 100 mg by mouth daily.  11 Taking   Scheduled:  . diltiazem  120 mg Oral Daily  . FLUoxetine  20 mg Oral Daily  . insulin aspart  0-15 Units Subcutaneous TID WC  . insulin glargine  10 Units Subcutaneous QHS  . mesalamine  0.75 g Oral BID  . metoprolol  200 mg Oral Daily  . multivitamin with minerals  1 tablet Oral Daily  . tamsulosin  0.4 mg Oral q12n4p   Infusions:  . cefTRIAXone (ROCEPHIN)  IV      Assessment: 78yo male c/o hyperglycemia, admitted for further w/u, to continue Eliquis; of note pt w/ AKI.   Plan:  Will continue Eliquis 5mg  BID.  Wynona Neat, PharmD, BCPS  12/16/2018,2:53 AM

## 2018-12-16 NOTE — Progress Notes (Signed)
Educated pt and pt wife on insulin booklet, provided hand out and demonstration on how to administer insulin  Pt properly administered lantus into abdomen  Pt and pt wife have no questions at this time  Will continue to monitor

## 2018-12-16 NOTE — Progress Notes (Signed)
Pt wife provided home medications, apriso Counted medication with wife, 6 total Filled out paper work and placed in chart  Brought medications to pharmacy

## 2018-12-16 NOTE — Progress Notes (Signed)
MD aware of orthostatics, stated to hold metoprolol

## 2018-12-17 DIAGNOSIS — N3000 Acute cystitis without hematuria: Secondary | ICD-10-CM

## 2018-12-17 LAB — BASIC METABOLIC PANEL
Anion gap: 7 (ref 5–15)
BUN: 20 mg/dL (ref 8–23)
CO2: 24 mmol/L (ref 22–32)
Calcium: 8.8 mg/dL — ABNORMAL LOW (ref 8.9–10.3)
Chloride: 106 mmol/L (ref 98–111)
Creatinine, Ser: 1.67 mg/dL — ABNORMAL HIGH (ref 0.61–1.24)
GFR calc Af Amer: 46 mL/min — ABNORMAL LOW (ref >60)
GFR calc non Af Amer: 39 mL/min — ABNORMAL LOW (ref >60)
Glucose, Bld: 175 mg/dL — ABNORMAL HIGH (ref 70–99)
Potassium: 4.1 mmol/L (ref 3.5–5.1)
Sodium: 137 mmol/L (ref 135–145)

## 2018-12-17 LAB — GLUCOSE, CAPILLARY
Glucose-Capillary: 214 mg/dL — ABNORMAL HIGH (ref 70–99)
Glucose-Capillary: 209 mg/dL — ABNORMAL HIGH (ref 70–99)
Glucose-Capillary: 197 mg/dL — ABNORMAL HIGH (ref 70–99)
Glucose-Capillary: 176 mg/dL — ABNORMAL HIGH (ref 70–99)

## 2018-12-17 LAB — URINE CULTURE
Culture: NO GROWTH
Special Requests: NORMAL

## 2018-12-17 LAB — HEMOGLOBIN A1C
Hgb A1c MFr Bld: 10.6 % — ABNORMAL HIGH (ref 4.8–5.6)
Mean Plasma Glucose: 257.52 mg/dL

## 2018-12-17 LAB — CBC WITH DIFFERENTIAL/PLATELET
Abs Immature Granulocytes: 0.03 10*3/uL (ref 0.00–0.07)
Basophils Absolute: 0 10*3/uL (ref 0.0–0.1)
Basophils Relative: 0 %
Eosinophils Absolute: 0.3 10*3/uL (ref 0.0–0.5)
Eosinophils Relative: 3 %
HCT: 37.7 % — ABNORMAL LOW (ref 39.0–52.0)
Hemoglobin: 12.7 g/dL — ABNORMAL LOW (ref 13.0–17.0)
Immature Granulocytes: 0 %
Lymphocytes Relative: 28 %
Lymphs Abs: 2.6 10*3/uL (ref 0.7–4.0)
MCH: 30.5 pg (ref 26.0–34.0)
MCHC: 33.7 g/dL (ref 30.0–36.0)
MCV: 90.6 fL (ref 80.0–100.0)
Monocytes Absolute: 1 10*3/uL (ref 0.1–1.0)
Monocytes Relative: 10 %
Neutro Abs: 5.5 10*3/uL (ref 1.7–7.7)
Neutrophils Relative %: 59 %
Platelets: 253 10*3/uL (ref 150–400)
RBC: 4.16 MIL/uL — ABNORMAL LOW (ref 4.22–5.81)
RDW: 13 % (ref 11.5–15.5)
WBC: 9.4 10*3/uL (ref 4.0–10.5)
nRBC: 0 % (ref 0.0–0.2)

## 2018-12-17 MED ORDER — INSULIN GLARGINE 100 UNIT/ML ~~LOC~~ SOLN
13.0000 [IU] | Freq: Two times a day (BID) | SUBCUTANEOUS | Status: DC
  Administered 2018-12-17 – 2018-12-18 (×2): 13 [IU] via SUBCUTANEOUS
  Filled 2018-12-17 (×2): qty 0.13

## 2018-12-17 MED ORDER — HYDROCERIN EX CREA
TOPICAL_CREAM | Freq: Two times a day (BID) | CUTANEOUS | Status: DC
  Administered 2018-12-17 – 2018-12-19 (×4): via TOPICAL
  Filled 2018-12-17: qty 113

## 2018-12-17 NOTE — Progress Notes (Addendum)
PROGRESS NOTE    Kevin [name]  OXB:[id number] DOB: [birth year] DOA: 12/15/2018 PCP: [name], MD   Brief Narrative:  78 year old with history of diabetes mellitus type 2, paroxysmal atrial fibrillation on Eliquis, essential hypertension, CKD stage III admitted to the hospital for feeling progressive fatigue, increasing thirst and urination.  He was found to have urinary tract infection with hyperglycemia.  Started on IV Rocephin and his home diabetic medications were changed to Lantus 10 units twice daily.   Assessment & Plan:   Principal Problem:   Acute renal failure superimposed on stage 3 chronic kidney disease (HCC) Active Problems:   Essential hypertension   Paroxysmal atrial fibrillation (HCC)   Benign prostatic hyperplasia with urinary retention   Type 2 diabetes mellitus with vascular disease (HCC)   UTI (urinary tract infection)   Chronic diastolic CHF (congestive heart failure) (HCC)   HLD (hyperlipidemia)  Sepsis secondary to urinary tract infection -Currently awaiting urine cultures.  Grossly his urine looks very cloudy.  We will continue IV Rocephin until we have further culture data.  IV fluids, supportive care.  Hyperglycemia, uncontrolled diabetes mellitus type 2 - No well-controlled on his home Amaryl, Januvia and Trulicity.  Transition to Lantus 13 units twice daily with insulin sliding scale.  Doing better.  We will continue to monitor this. Repeat A1c  Essential hypertension - Cardizem 120 mg orally daily.  Metoprolol 200 mg daily  Hyperlipidemia - Not on any home medications.  On Crestor at home?.  Chronic diastolic congestive heart failure -Last echocardiogram June 2019, normal ejection fraction.  Holding home Lasix while he is dehydrated getting IV fluids.  Paroxysmal atrial fibrillation -Currently in normal sinus rhythm.  Rate control with Cardizem and metoprolol.  Continue Eliquis  Ulcerative colitis; stable -Not active.  On mesalamine   BPH -On Flomax.  [facility] Dr. Chauncey Cruel   DVT prophylaxis: Eliquis Code Status: Full code Family Communication: None at bedside Disposition Plan: Maintain inpatient stay for IV hydration, better blood glucose control and further urine culture data.  Consultants:   None  Procedures:   None  Antimicrobials:   Rocephin day 2   Subjective: Patient states he feels slightly better after getting IV fluids but is still having quite cloudy urine.  Afebrile overnight.  Review of Systems Otherwise negative except as per HPI, including: General: Denies fever, chills, night sweats or unintended weight loss. Resp: Denies cough, wheezing, shortness of breath. Cardiac: Denies chest pain, palpitations, orthopnea, paroxysmal nocturnal dyspnea. GI: Denies abdominal pain, nausea, vomiting, diarrhea or constipation GU: Denies dysuria, frequency, hesitancy or incontinence MS: Denies muscle aches, joint pain or swelling Neuro: Denies headache, neurologic deficits (focal weakness, numbness, tingling), abnormal gait Psych: Denies anxiety, depression, SI/HI/AVH Skin: Denies new rashes or lesions ID: Denies sick contacts, exotic exposures, travel  Objective: Vitals:   12/17/18 0513 12/17/18 0517 12/17/18 0532 12/17/18 0544  BP: 128/76 96/80    Pulse: 79  (!) 107   Resp:      Temp:      TempSrc:      SpO2: 97%     Weight:    99.4 kg  Height:        Intake/Output Summary (Last 24 hours) at 12/17/2018 1143 Last data filed at 12/17/2018 0521 Gross per 24 hour  Intake 2755.07 ml  Output 1800 ml  Net 955.07 ml   Filed Weights   12/15/18 2116 12/16/18 1901 12/17/18 0544  Weight: 97.1 kg 99.8 kg 99.4 kg  Examination:  General exam: Appears calm and comfortable  Respiratory system: Clear to auscultation. Respiratory effort normal. Cardiovascular system: S1 & S2 heard, RRR. No JVD, murmurs, rubs, gallops or clicks. No pedal edema. Gastrointestinal  system: Abdomen is nondistended, soft and nontender. No organomegaly or masses felt. Normal bowel sounds heard. Central nervous system: Alert and oriented. No focal neurological deficits. Extremities: Symmetric 5 x 5 power. Skin: No rashes, lesions or ulcers Psychiatry: Judgement and insight appear normal. Mood & affect appropriate.     Data Reviewed:   CBC: Recent Labs  Lab 12/15/18 2132 12/15/18 2133 12/17/18 0500  WBC 10.1  --  9.4  NEUTROABS  --   --  5.5  HGB 13.6 13.9 12.7*  HCT 41.1 41.0 37.7*  MCV 90.3  --  90.6  PLT 326  --  322   Basic Metabolic Panel: Recent Labs  Lab 12/15/18 2132 12/15/18 2133 12/16/18 0615 12/17/18 0500  NA 129* 132* 135 137  K 4.4 4.5 4.3 4.1  CL 94*  --  102 106  CO2 25  --  24 24  GLUCOSE 398*  --  224* 175*  BUN 34*  --  26* 20  CREATININE 2.24*  --  1.96* 1.67*  CALCIUM 9.3  --  8.9 8.8*   GFR: Estimated Creatinine Clearance: 47.4 mL/min (A) (by C-G formula based on SCr of 1.67 mg/dL (H)). Liver Function Tests: No results for input(s): AST, ALT, ALKPHOS, BILITOT, PROT, ALBUMIN in the last 168 hours. No results for input(s): LIPASE, AMYLASE in the last 168 hours. No results for input(s): AMMONIA in the last 168 hours. Coagulation Profile: No results for input(s): INR, PROTIME in the last 168 hours. Cardiac Enzymes: No results for input(s): CKTOTAL, CKMB, CKMBINDEX, TROPONINI in the last 168 hours. BNP (last 3 results) Recent Labs    04/27/18 1236  PROBNP 3,814*   HbA1C: No results for input(s): HGBA1C in the last 72 hours. CBG: Recent Labs  Lab 12/16/18 0644 12/16/18 1111 12/16/18 1556 12/16/18 2051 12/17/18 0828  GLUCAP 192* 320* 222* 185* 214*   Lipid Profile: No results for input(s): CHOL, HDL, LDLCALC, TRIG, CHOLHDL, LDLDIRECT in the last 72 hours. Thyroid Function Tests: Recent Labs    12/16/18 0828  TSH 3.488   Anemia Panel: No results for input(s): VITAMINB12, FOLATE, FERRITIN, TIBC, IRON, RETICCTPCT  in the last 72 hours. Sepsis Labs: No results for input(s): PROCALCITON, LATICACIDVEN in the last 168 hours.  Recent Results (from the past 240 hour(s))  Urine culture     Status: None   Collection Time: 12/15/18 10:26 PM  Result Value Ref [place] Status   Specimen Description   Final    URINE, CLEAN CATCH Performed at [facility], [address] [place], [place] [address]    Special Requests   Final    Normal Performed at [facility], [address] [place], [place] [address]    Culture   Final    NO GROWTH Performed at [facility], [place] [address] [place], [place] [address]    Report Status 12/17/2018 FINAL  Final         Radiology Studies: US Renal  Result Date: 12/16/2018 CLINICAL DATA:  78 year old male with history of acute kidney injury with elevated BUN and creatinine. History of hypertension. Diabetes. Chronic kidney disease. EXAM: RENAL / URINARY TRACT ULTRASOUND COMPLETE COMPARISON:  Renal ultrasound 06/14/2018. FINDINGS: Right Kidney: Renal measurements: 10.8 x 5.3 x 5.8 cm =  volume: 174 mL . Echogenicity mildly increased. No mass. Mild hydronephrosis. Left Kidney: Renal measurements: 10.1 x 5.5 x 5.9 cm = volume: 170 mL. Echogenicity mildly increased. Limits. No mass. Mild hydronephrosis. Bladder: Urinary bladder is partially decompressed. There is some dependent echogenic material which does not shadow in the urinary bladder, concerning for internal debris. IMPRESSION: 1. Increased echogenicity in the kidneys bilaterally, suggesting medical renal disease. 2. Mild bilateral hydronephrosis, similar to prior CT 11/01/2018. 3. Echogenic nonshadowing material in the lumen of the urinary bladder, concerning for debris. 4. Urinary bladder wall appears mildly thickened, this could be in part related to partial decompression, however, this is similar to recent noncontrast CT examination. Electronically Signed   By: Vinnie Langton M.D.   On:  12/16/2018 05:24        Scheduled Meds: . apixaban  5 mg Oral BID  . diltiazem  120 mg Oral Daily  . FLUoxetine  20 mg Oral Daily  . insulin aspart  0-15 Units Subcutaneous TID WC  . insulin glargine  10 Units Subcutaneous BID  . mesalamine  0.75 g Oral BID  . metoprolol  200 mg Oral Daily  . multivitamin with minerals  1 tablet Oral Daily  . tamsulosin  0.4 mg Oral q12n4p   Continuous Infusions: . sodium chloride 100 mL/hr at 12/17/18 0527  . cefTRIAXone (ROCEPHIN)  IV Stopped (12/16/18 2221)     LOS: 1 day   Time spent= 35 mins    Ankit Arsenio Loader, MD [facility]  If 7PM-7AM, please contact night-coverage [email or web address] 12/17/2018, 11:43 AM

## 2018-12-17 NOTE — Consult Note (Signed)
[facility] Nurse wound consult note Reason for Consult: callus formation at 1st metatarsal head, left foot. 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 Wound type: friction Pressure Injury POA:NA Measurement:1.5cm x 1cm flat callus. (Wife smooths with pumice stone daily after patient bathes) Wound bed:N/A Drainage (amount, consistency, odor) none Periwound:intact, dry Dressing procedure/placement/frequency:I will add twice daily moisturizing with Eucerin cream. Recommend patient follow up with orthotist for evaluation of weightbearing pressure points and provision of custom insole before an ulceration appears. This can be accomplished in a podiatric office or the office of an orthopedic MD (eg., [name] Given).  If you agree, please order/arrange prior to discharge.  [facility] nursing team will not follow, but will remain available to this patient, the nursing and medical teams.  Please re-consult if needed. Thanks, Maudie Flakes, MSN, RN, [place], [facility]  Pager# [phone]

## 2018-12-17 NOTE — Progress Notes (Signed)
MEWS/VS Documentation      12/17/2018 0513 12/17/2018 0514 12/17/2018 0517 12/17/2018 0532   MEWS Score:  0  3  1  2    MEWS Score Color:  Green  Yellow  Green  Yellow   Pulse:  79  -  -  (!) 107   BP:  128/76  -  96/80  -   O2 Device:  Room Air  -  -  -

## 2018-12-17 NOTE — Progress Notes (Signed)
Patient HR 140s while standing to get orthostatic VS. Patient says he feels fine, HR was non-sustaining.

## 2018-12-17 NOTE — Progress Notes (Addendum)
Pt's wife asking for a phone call tomorrow, 12/18/2018, with education on insulin and where to find the sliding scale prior to discharge. Will notify RN to let day shift RN know to call wife.

## 2018-12-17 NOTE — Progress Notes (Addendum)
Patient has positive ortho VS (sitting to standing), after the last standing set of VS, patient did complain of a headache/being light-heeded, RN and NT help patient sit down, HR 112-117 and trending down.

## 2018-12-18 LAB — BASIC METABOLIC PANEL
Anion gap: 7 (ref 5–15)
BUN: 16 mg/dL (ref 8–23)
CO2: 23 mmol/L (ref 22–32)
Calcium: 8.5 mg/dL — ABNORMAL LOW (ref 8.9–10.3)
Chloride: 107 mmol/L (ref 98–111)
Creatinine, Ser: 1.64 mg/dL — ABNORMAL HIGH (ref 0.61–1.24)
GFR calc Af Amer: 47 mL/min — ABNORMAL LOW (ref >60)
GFR calc non Af Amer: 40 mL/min — ABNORMAL LOW (ref >60)
Glucose, Bld: 145 mg/dL — ABNORMAL HIGH (ref 70–99)
Potassium: 4.2 mmol/L (ref 3.5–5.1)
Sodium: 137 mmol/L (ref 135–145)

## 2018-12-18 LAB — CBC
HCT: 38.1 % — ABNORMAL LOW (ref 39.0–52.0)
Hemoglobin: 12.4 g/dL — ABNORMAL LOW (ref 13.0–17.0)
MCH: 29.6 pg (ref 26.0–34.0)
MCHC: 32.5 g/dL (ref 30.0–36.0)
MCV: 90.9 fL (ref 80.0–100.0)
Platelets: 254 10*3/uL (ref 150–400)
RBC: 4.19 MIL/uL — ABNORMAL LOW (ref 4.22–5.81)
RDW: 13 % (ref 11.5–15.5)
WBC: 10.2 10*3/uL (ref 4.0–10.5)
nRBC: 0 % (ref 0.0–0.2)

## 2018-12-18 LAB — GLUCOSE, CAPILLARY
Glucose-Capillary: 157 mg/dL — ABNORMAL HIGH (ref 70–99)
Glucose-Capillary: 214 mg/dL — ABNORMAL HIGH (ref 70–99)
Glucose-Capillary: 168 mg/dL — ABNORMAL HIGH (ref 70–99)
Glucose-Capillary: 197 mg/dL — ABNORMAL HIGH (ref 70–99)

## 2018-12-18 LAB — MAGNESIUM: Magnesium: 1.9 mg/dL (ref 1.7–2.4)

## 2018-12-18 MED ORDER — MESALAMINE 1.2 G PO TBEC
1.2000 g | DELAYED_RELEASE_TABLET | Freq: Every day | ORAL | Status: DC
  Administered 2018-12-18 – 2018-12-19 (×2): 1.2 g via ORAL
  Filled 2018-12-18 (×2): qty 1

## 2018-12-18 MED ORDER — INSULIN GLARGINE 100 UNIT/ML ~~LOC~~ SOLN
16.0000 [IU] | Freq: Two times a day (BID) | SUBCUTANEOUS | Status: DC
  Administered 2018-12-18: 3 [IU] via SUBCUTANEOUS
  Administered 2018-12-18 – 2018-12-19 (×2): 16 [IU] via SUBCUTANEOUS
  Filled 2018-12-18 (×3): qty 0.16

## 2018-12-18 MED ORDER — INSULIN GLARGINE 100 UNIT/ML ~~LOC~~ SOLN: 15.0000 [IU] | Freq: Two times a day (BID) | SUBCUTANEOUS | Status: DC

## 2018-12-18 MED FILL — Insulin Regular (Human) Inj 100 Unit/ML: INTRAMUSCULAR | Qty: 0.04 | Status: AC

## 2018-12-18 NOTE — Progress Notes (Signed)
PROGRESS NOTE    Kevin [name]  NOI:[id number] DOB: 1941/07/27 DOA: 12/15/2018 PCP: [name], MD   Brief Narrative:  78 year old with history of diabetes mellitus type 2, paroxysmal atrial fibrillation on Eliquis, essential hypertension, CKD stage III admitted to the hospital for feeling progressive fatigue, increasing thirst and urination.  He was found to have urinary tract infection with hyperglycemia.  Started on IV Rocephin and his home diabetic medications were changed to Lantus units twice daily.   Assessment & Plan:   Principal Problem:   Acute renal failure superimposed on stage 3 chronic kidney disease (HCC) Active Problems:   Essential hypertension   Paroxysmal atrial fibrillation (HCC)   Benign prostatic hyperplasia with urinary retention   Type 2 diabetes mellitus with vascular disease (HCC)   UTI (urinary tract infection)   Chronic diastolic CHF (congestive heart failure) (HCC)   HLD (hyperlipidemia)  Sepsis secondary to urinary tract infection; Ucx pending.  -UCX- NGTD.  Grossly his urine looks very cloudy.  Continue IV Rocephin until further culture data is available..  IV fluids, supportive care.  Hyperglycemia, uncontrolled diabetes mellitus type 2 - No well-controlled on his home Amaryl, Januvia and Trulicity.  Increase to Lantus 16 units twice daily with insulin sliding scale.  Doing better.  We will continue to monitor this. A1c 10.6  Essential hypertension - Cardizem 120 mg orally daily.  Metoprolol 200 mg daily  Hyperlipidemia - Not on any home medications.  On Crestor at home?.  Chronic diastolic congestive heart failure -Last echocardiogram June 2019, normal ejection fraction.  Cont Gentle hydration with caution.   Paroxysmal atrial fibrillation -Currently in normal sinus rhythm.  Rate control with Cardizem and metoprolol.  Continue Eliquis  Ulcerative colitis; stable -Not active.  On mesalamine  BPH -On Flomax.  Follows [facility]  with [facility] Dr. Chauncey Cruel   DVT prophylaxis: Eliquis Code Status: Full code Family Communication: None at bedside Disposition Plan: Cont IVF, IV Abx until her have further culture data.   Consultants:   None  Procedures:   None  Antimicrobials:   Rocephin day 3   Subjective: Feels ok, still got little dizzy when he got up yerterday evening.   Review of Systems Otherwise negative except as per HPI, including: General = no fevers, chills, dizziness, malaise, fatigue HEENT/EYES = negative for pain, redness, loss of vision, double vision, blurred vision, loss of hearing, sore throat, hoarseness, dysphagia Cardiovascular= negative for chest pain, palpitation, murmurs, lower extremity swelling Respiratory/lungs= negative for shortness of breath, cough, hemoptysis, wheezing, mucus production Gastrointestinal= negative for nausea, vomiting,, abdominal pain, melena, hematemesis Genitourinary= negative for Dysuria, Hematuria, Change in Urinary Frequency MSK = Negative for arthralgia, myalgias, Back Pain, Joint swelling  Neurology= Negative for headache, seizures, numbness, tingling  Psychiatry= Negative for anxiety, depression, suicidal and homocidal ideation Allergy/Immunology= Medication/Food allergy as listed  Skin= Negative for Rash, lesions, ulcers, itching   Objective: Vitals:   12/17/18 0532 12/17/18 0544 12/17/18 2224 12/18/18 0514  BP:   123/77 119/74  Pulse: (!) 107  88 93  Resp:      Temp:   98.5 F (36.9 C) 97.6 F (36.4 C)  TempSrc:   Oral Oral  SpO2:   96% 97%  Weight:  99.4 kg    Height:        Intake/Output Summary (Last 24 hours) at 12/18/2018 1004 Last data filed at 12/18/2018 0515 Gross per 24 hour  Intake 2309.02 ml  Output 1400 ml  Net 909.02 ml   Filed  Weights   12/15/18 2116 12/16/18 1901 12/17/18 0544  Weight: 97.1 kg 99.8 kg 99.4 kg    Examination:  Constitutional: NAD, calm, comfortable Eyes: PERRL, lids and conjunctivae  normal ENMT: Mucous membranes are DRY. Posterior pharynx clear of any exudate or lesions.Normal dentition.  Neck: normal, supple, no masses, no thyromegaly Respiratory: clear to auscultation bilaterally, no wheezing, no crackles. Normal respiratory effort. No accessory muscle use.  Cardiovascular: Regular rate and rhythm, no murmurs / rubs / gallops. No extremity edema. 2+ pedal pulses. No carotid bruits.  Abdomen: no tenderness, no masses palpated. No hepatosplenomegaly. Bowel sounds positive.  Musculoskeletal: no clubbing / cyanosis. No joint deformity upper and lower extremities. Good ROM, no contractures. Normal muscle tone.  Skin: no rashes, lesions, ulcers. No induration Neurologic: CN 2-12 grossly intact. Sensation intact, DTR normal. Strength 4+/5 in all 4.  Psychiatric: Normal judgment and insight. Alert and oriented x 3. Normal mood.    Data Reviewed:   CBC: Recent Labs  Lab 12/15/18 2132 12/15/18 2133 12/17/18 0500 12/18/18 0503  WBC 10.1  --  9.4 10.2  NEUTROABS  --   --  5.5  --   HGB 13.6 13.9 12.7* 12.4*  HCT 41.1 41.0 37.7* 38.1*  MCV 90.3  --  90.6 90.9  PLT 326  --  253 979   Basic Metabolic Panel: Recent Labs  Lab 12/15/18 2132 12/15/18 2133 12/16/18 0615 12/17/18 0500 12/18/18 0503  NA 129* 132* 135 137 137  K 4.4 4.5 4.3 4.1 4.2  CL 94*  --  102 106 107  CO2 25  --  24 24 23   GLUCOSE 398*  --  224* 175* 145*  BUN 34*  --  26* 20 16  CREATININE 2.24*  --  1.96* 1.67* 1.64*  CALCIUM 9.3  --  8.9 8.8* 8.5*  MG  --   --   --   --  1.9   GFR: Estimated Creatinine Clearance: 48.3 mL/min (A) (by C-G formula based on SCr of 1.64 mg/dL (H)). Liver Function Tests: No results for input(s): AST, ALT, ALKPHOS, BILITOT, PROT, ALBUMIN in the last 168 hours. No results for input(s): LIPASE, AMYLASE in the last 168 hours. No results for input(s): AMMONIA in the last 168 hours. Coagulation Profile: No results for input(s): INR, PROTIME in the last 168 hours.  Cardiac Enzymes: No results for input(s): CKTOTAL, CKMB, CKMBINDEX, TROPONINI in the last 168 hours. BNP (last 3 results) Recent Labs    04/27/18 1236  PROBNP 3,814*   HbA1C: Recent Labs    12/17/18 0500  HGBA1C 10.6*   CBG: Recent Labs  Lab 12/17/18 0828 12/17/18 1202 12/17/18 1713 12/17/18 2225 12/18/18 0815  GLUCAP 214* 209* 197* 176* 157*   Lipid Profile: No results for input(s): CHOL, HDL, LDLCALC, TRIG, CHOLHDL, LDLDIRECT in the last 72 hours. Thyroid Function Tests: Recent Labs    12/16/18 0828  TSH 3.488   Anemia Panel: No results for input(s): VITAMINB12, FOLATE, FERRITIN, TIBC, IRON, RETICCTPCT in the last 72 hours. Sepsis Labs: No results for input(s): PROCALCITON, LATICACIDVEN in the last 168 hours.  Recent Results (from the past 240 hour(s))  Urine culture     Status: None   Collection Time: 12/15/18 10:26 PM  Result Value Ref [place] Status   Specimen Description   Final    URINE, CLEAN CATCH Performed at [facility], [address] [place], [place] [address]    Special Requests   Final    Normal  Performed at [facility], [address] [place], [place] [address]    Culture   Final    NO GROWTH Performed at [facility], [place] [address] [place], [place] [address]    Report Status 12/17/2018 FINAL  Final         Radiology Studies: No results found.      Scheduled Meds: . apixaban  5 mg Oral BID  . diltiazem  120 mg Oral Daily  . FLUoxetine  20 mg Oral Daily  . hydrocerin   Topical BID  . insulin aspart  0-15 Units Subcutaneous TID WC  . insulin glargine  16 Units Subcutaneous BID  . mesalamine  0.75 g Oral BID  . metoprolol  200 mg Oral Daily  . multivitamin with minerals  1 tablet Oral Daily  . tamsulosin  0.4 mg Oral q12n4p   Continuous Infusions: . sodium chloride 100 mL/hr at 12/17/18 1526  . cefTRIAXone (ROCEPHIN)  IV 1 g (12/17/18 2354)     LOS: 2 days   Time spent= 25 mins     Ankit Arsenio Loader, MD [facility]  If 7PM-7AM, please contact night-coverage [email or web address] 12/18/2018, 10:04 AM

## 2018-12-18 NOTE — Progress Notes (Signed)
Inpatient Diabetes Program Recommendations  AACE/ADA: New Consensus Statement on Inpatient Glycemic Control (2015)  Target Ranges:  Prepandial:   less than 140 mg/dL      Peak postprandial:   less than 180 mg/dL (1-2 hours)      Critically ill patients:  140 - 180 mg/dL   Lab Results  Component Value Date   GLUCAP 168 (H) 12/18/2018   HGBA1C 10.6 (H) 12/17/2018    Spoke with patient about Hgb A1c of 10.6%.   Explained what an A1c is and what it measures. He was aware it was running higher than goal.  Reminded patient that goal A1c is 7% or less per ADA standards to prevent both acute and long-term complications. Explained to patient the extreme importance of good glucose control at home. Encouraged patient to check CBGs at least bid at home (fasting and another check within the day) and to record all CBGs in a logbook for PCP to review. He does have CBG meter and checks it at home. Has PCP (Dr. Marisue Humble) who manages his diabetes.   Once it is determined what type of insulin he will be going home on will do a benefits check for difference between insulin pens and vial/syringe. Will check with MD in am regarding plans for discharge meds.  -- Will follow during hospitalization.--  Jonna Clark RN, MSN Diabetes Coordinator Inpatient Glycemic Control Team Team Pager: [phone] (8am-5pm)

## 2018-12-19 LAB — CBC
HCT: 35.6 % — ABNORMAL LOW (ref 39.0–52.0)
Hemoglobin: 11.4 g/dL — ABNORMAL LOW (ref 13.0–17.0)
MCH: 29.5 pg (ref 26.0–34.0)
MCHC: 32 g/dL (ref 30.0–36.0)
MCV: 92.2 fL (ref 80.0–100.0)
Platelets: 255 10*3/uL (ref 150–400)
RBC: 3.86 MIL/uL — ABNORMAL LOW (ref 4.22–5.81)
RDW: 13.1 % (ref 11.5–15.5)
WBC: 8.2 10*3/uL (ref 4.0–10.5)
nRBC: 0 % (ref 0.0–0.2)

## 2018-12-19 LAB — MAGNESIUM: Magnesium: 1.7 mg/dL (ref 1.7–2.4)

## 2018-12-19 LAB — BASIC METABOLIC PANEL
Anion gap: 9 (ref 5–15)
BUN: 18 mg/dL (ref 8–23)
CO2: 23 mmol/L (ref 22–32)
Calcium: 8.5 mg/dL — ABNORMAL LOW (ref 8.9–10.3)
Chloride: 107 mmol/L (ref 98–111)
Creatinine, Ser: 1.77 mg/dL — ABNORMAL HIGH (ref 0.61–1.24)
GFR calc Af Amer: 43 mL/min — ABNORMAL LOW (ref >60)
GFR calc non Af Amer: 37 mL/min — ABNORMAL LOW (ref >60)
Glucose, Bld: 118 mg/dL — ABNORMAL HIGH (ref 70–99)
Potassium: 4.2 mmol/L (ref 3.5–5.1)
Sodium: 139 mmol/L (ref 135–145)

## 2018-12-19 LAB — GLUCOSE, CAPILLARY: Glucose-Capillary: 122 mg/dL — ABNORMAL HIGH (ref 70–99)

## 2018-12-19 IMAGING — DX DG CHEST 1V PORT
1 series · 2 of 2 positions shown · non-contrast
Comparison: Chest radiograph performed 04/27/2018

CLINICAL DATA: Acute onset of respiratory distress. Shortness of
breath.

EXAM:
PORTABLE CHEST 1 VIEW

[Series 1: chest · 0.14mm/px · 2 of 2 slices shown]
[im 1/2]
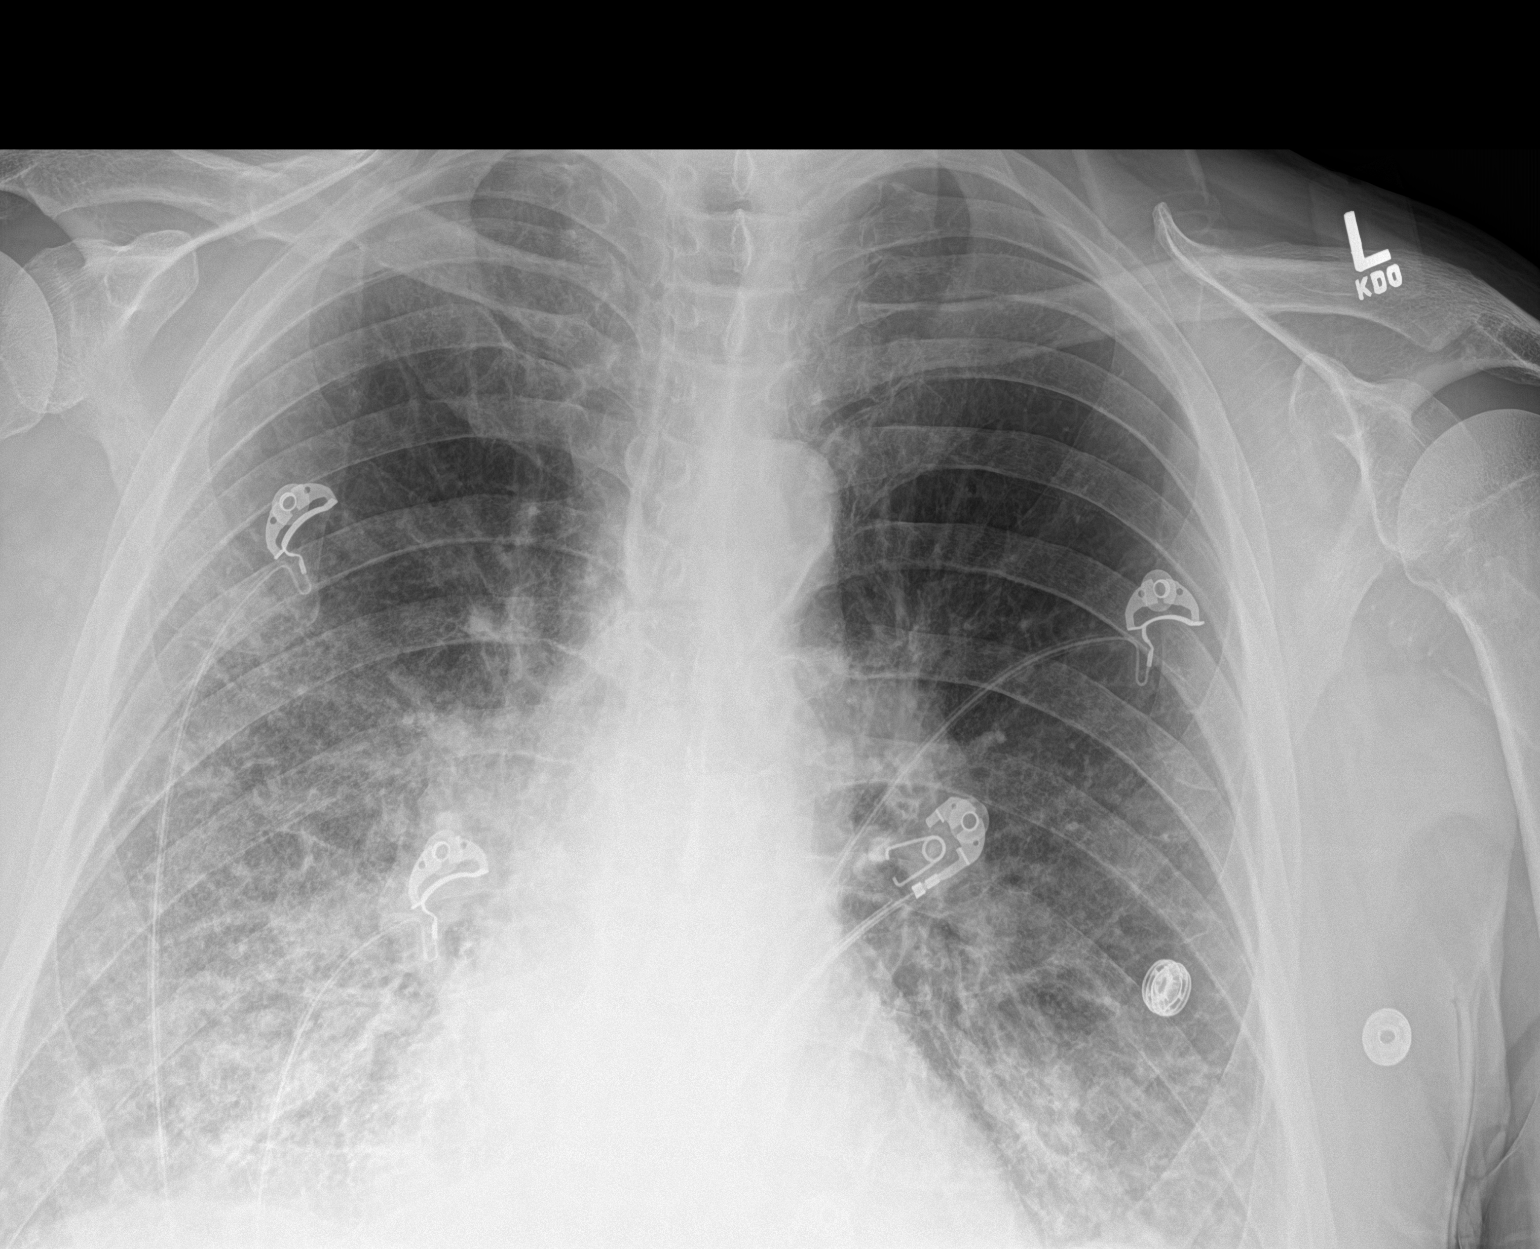
[im 2/2]
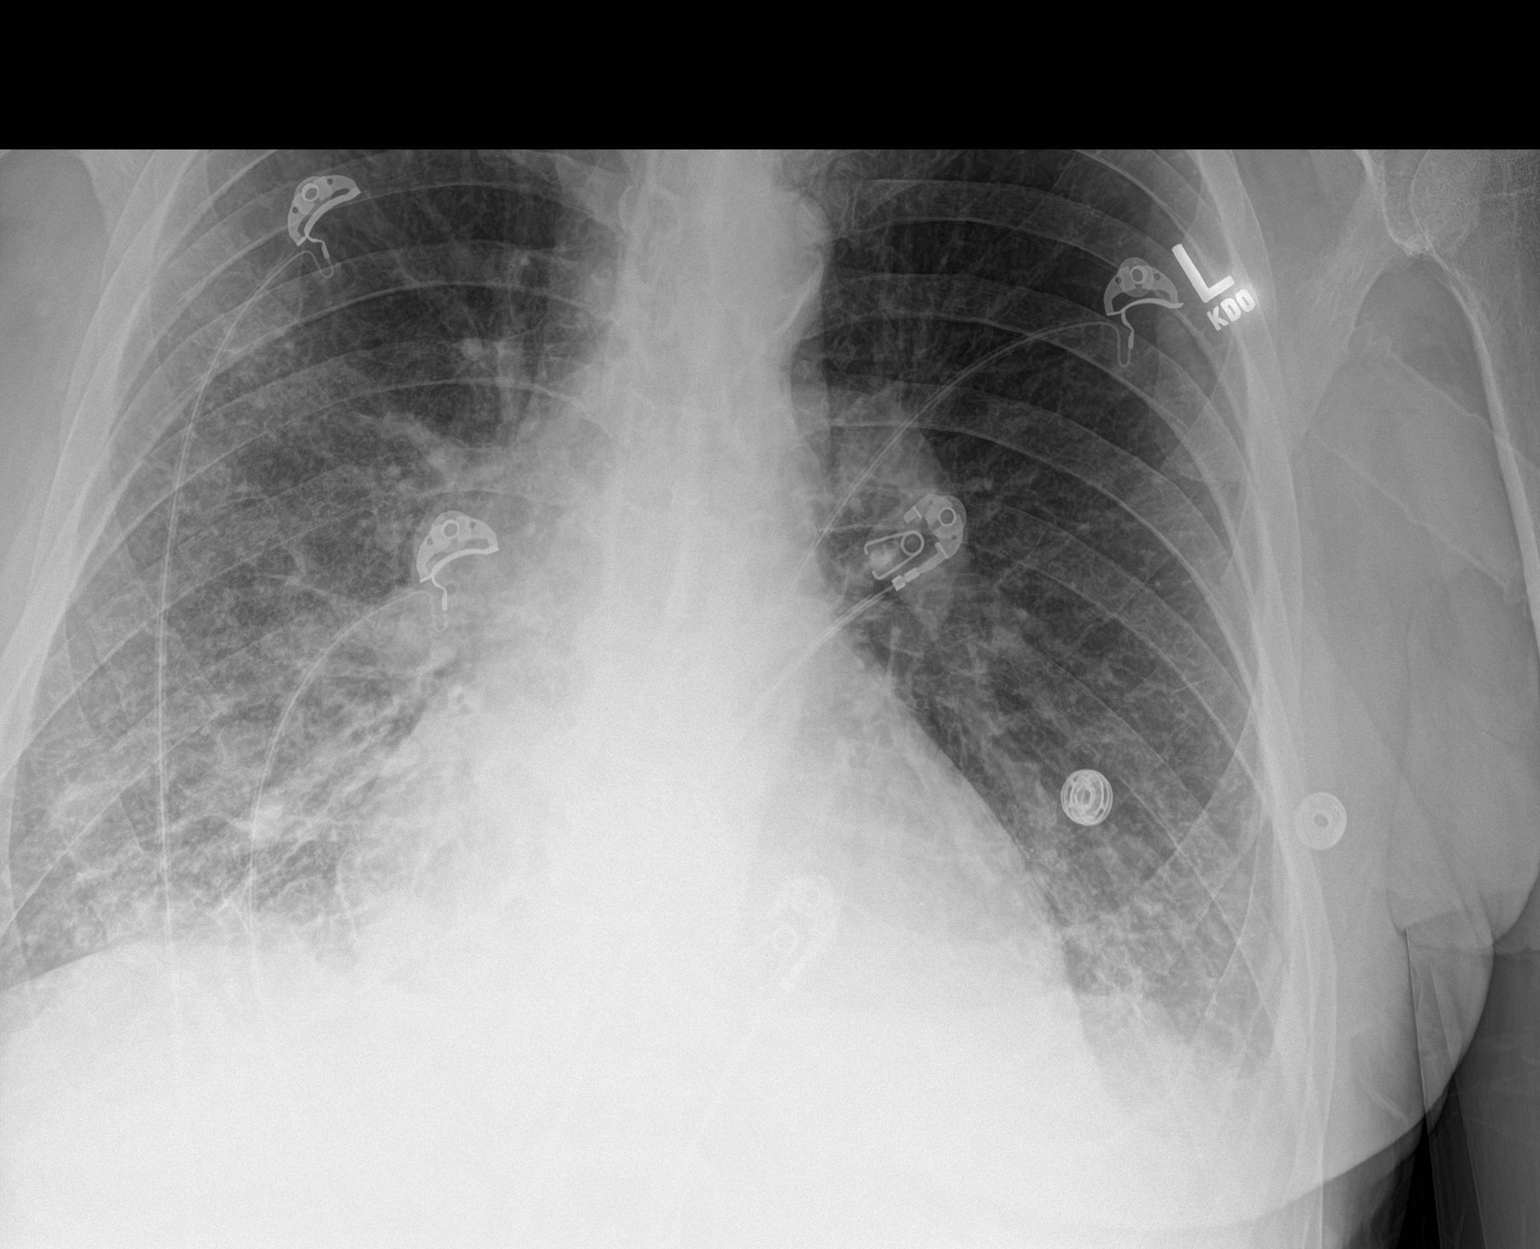

[2 of 2 positions shown; findings below may reference images not displayed]

FINDINGS: The lungs are well-aerated. Bilateral midlung and bibasilar airspace
opacities raise concern for pulmonary edema, with a small left
pleural effusion. No pneumothorax is seen. Mild vascular congestion
is noted.

The cardiomediastinal silhouette is within normal limits. No acute
osseous abnormalities are seen.
IMPRESSION: Bilateral midlung and bibasilar airspace opacities raise concern for
pulmonary edema, with a small left pleural effusion, mildly worsened
from the prior study. Mild vascular congestion noted.

## 2018-12-19 MED ORDER — INSULIN GLARGINE 100 UNIT/ML ~~LOC~~ SOLN: 16.0000 [IU] | Freq: Two times a day (BID) | SUBCUTANEOUS | 0 refills | Status: DC

## 2018-12-19 MED ORDER — CEFPODOXIME PROXETIL 100 MG PO TABS: 200.0000 mg | ORAL_TABLET | Freq: Two times a day (BID) | ORAL | 0 refills | Status: AC

## 2018-12-19 MED ORDER — CEFPODOXIME PROXETIL 100 MG PO TABS: 100.0000 mg | ORAL_TABLET | Freq: Two times a day (BID) | ORAL | 0 refills | Status: DC

## 2018-12-19 MED FILL — LANTUS SOLOSTAR 100 UNITS/M: 100 | 30 days supply | Qty: 12 | Fill #0

## 2018-12-19 MED FILL — PENTIPS 31G X 8 MM MISC: 31G X 8 MM | 30 days supply | Qty: 100 | Fill #0

## 2018-12-19 MED FILL — CEFPODOXIME 200 MG TABLET: 200 | 5 days supply | Qty: 10 | Fill #0

## 2018-12-19 NOTE — Discharge Summary (Signed)
Physician Discharge Summary  Kevin [name] JJO:[id number] DOB: 07-01-1941 DOA: 12/15/2018  PCP: [name], MD  Admit date: 12/15/2018 Discharge date: 12/19/2018  Admitted From: Home  Disposition:  Home   Recommendations for Outpatient Follow-up:  1. Follow up with PCP in 1-2 weeks 2. Please obtain BMP/CBC in one week your next doctors visit.  3. Discontinue home Previous DM medications and now started on lantus 16U BID.  4. Vantin PO for 5 days.   Discharge Condition: Stable CODE STATUS: Full Diet recommendation: Diabetic.   Brief/Interim Summary:78 year old with history of diabetes mellitus type 2, paroxysmal atrial fibrillation on Eliquis, essential hypertension, CKD stage III admitted to the hospital for feeling progressive fatigue, increasing thirst and urination.  He was found to have urinary tract infection with hyperglycemia.  IV Rocephin was started.  Home diabetic regimen was stopped and switched over to Lantus which he tolerated really well.  Urine cultures remain negative therefore he was switched from IV Rocephin to oral Vantin for 5 more days. Patient was educated by diabetic coronary. [place] outpatient [name] Given for his LE flat callus with mild ulceration. He has seen the patient in the past.     Discharge Diagnoses:  Principal Problem:   Acute renal failure superimposed on stage 3 chronic kidney disease ([place]) Active Problems:   Essential hypertension   Paroxysmal atrial fibrillation (HCC)   Benign prostatic hyperplasia with urinary retention   Type 2 diabetes mellitus with vascular disease (HCC)   UTI (urinary tract infection)   Chronic diastolic CHF (congestive heart failure) (HCC)   HLD (hyperlipidemia)   Sepsis secondary to urinary tract infection -UCx- NGTD.  Transition Rocephin to Vantin for 5 more days orally.   Hyperglycemia, uncontrolled diabetes mellitus type 2 - No well-controlled on his home Amaryl, Januvia and Trulicity.  Doing on on lantus  16U BID. Will go home on that. Education provided. Advised to keep log of this at home. He understands.   Essential hypertension - Cardizem 120 mg orally daily.  Metoprolol 200 mg daily  Hyperlipidemia - Not on any home medications.  On Crestor at home?.  Chronic diastolic congestive heart failure -Resume home meds, doing well.   Paroxysmal atrial fibrillation -Currently in normal sinus rhythm.  Rate control with Cardizem and metoprolol.  Continue Eliquis  Ulcerative colitis; stable -Not active.  On mesalamine  BPH -On Flomax.  Follows outpatient with [facility] Dr. Chauncey Cruel   Consultations:  None  Subjective: Feels better, wishes to go home.   Discharge Exam: Vitals:   12/18/18 2109 12/19/18 0437  BP: 126/64 (!) 143/87  Pulse: 66 81  Resp: 18 18  Temp: 98.2 F (36.8 C) (!) 97.5 F (36.4 C)  SpO2: 100% 97%   Vitals:   12/18/18 0514 12/18/18 1357 12/18/18 2109 12/19/18 0437  BP: 119/74 (!) 98/49 126/64 (!) 143/87  Pulse: 93 88 66 81  Resp:  18 18 18   Temp: 97.6 F (36.4 C) 99 F (37.2 C) 98.2 F (36.8 C) (!) 97.5 F (36.4 C)  TempSrc: Oral Oral Oral Oral  SpO2: 97% 98% 100% 97%  Weight:    101.7 kg  Height:        General: Pt is alert, awake, not in acute distress Cardiovascular: RRR, S1/S2 +, no rubs, no gallops Respiratory: CTA bilaterally, no wheezing, no rhonchi Abdominal: Soft, NT, ND, bowel sounds + Extremities: no edema, no cyanosis  Discharge Instructions   Allergies as of 12/19/2018      Reactions   Sulfa  Antibiotics Rash      Medication List    STOP taking these medications   acetaminophen 325 MG tablet Commonly known as:  TYLENOL   glimepiride 4 MG tablet Commonly known as:  AMARYL   sitaGLIPtin 100 MG tablet Commonly known as:  JANUVIA   Trulicity 1.5 IR/4.8NI Sopn Generic drug:  Dulaglutide     TAKE these medications   apixaban 5 MG Tabs tablet Commonly known as:  ELIQUIS Take 1 tablet (5 mg total) by  mouth 2 (two) times daily.   cefpodoxime 100 MG tablet Commonly known as:  VANTIN Take 1 tablet (100 mg total) by mouth 2 (two) times daily for 5 days.   diltiazem 120 MG 24 hr capsule Commonly known as:  Cardizem CD Take 1 capsule (120 mg total) by mouth daily.   FLUoxetine 20 MG capsule Commonly known as:  PROZAC Take 20 mg by mouth daily.   furosemide 20 MG tablet Commonly known as:  LASIX Take 1 tablet (20 mg total) by mouth daily. You may take an extra 20 mg tablet for increased swelling or weight gain What changed:    when to take this  additional instructions   insulin glargine 100 UNIT/ML injection Commonly known as:  LANTUS Inject 0.16 mLs (16 Units total) into the skin 2 (two) times daily for 30 days.   mesalamine 0.375 g 24 hr capsule Commonly known as:  APRISO Take 750 mg by mouth 2 (two) times daily before a meal.   metoprolol 200 MG 24 hr tablet Commonly known as:  TOPROL-XL Take 1 tablet (200 mg total) by mouth daily. Take with or immediately following a meal.   multivitamin with minerals Tabs tablet Take 1 tablet by mouth daily.   rosuvastatin 10 MG tablet Commonly known as:  CRESTOR Take 5 mg by mouth daily. What changed:  Another medication with the same name was removed. Continue taking this medication, and follow the directions you see here.   tamsulosin 0.4 MG Caps capsule Commonly known as:  FLOMAX Take 1 capsule (0.4 mg total) by mouth 2 times daily at 12 noon and 4 pm. What changed:  when to take this   trimethoprim 100 MG tablet Commonly known as:  TRIMPEX Take 100 mg by mouth at bedtime.      Follow-up Information    [name], MD. Schedule an appointment as soon as possible for a visit in 1 week(s).   Specialty:  Family Medicine Contact information: [address], [place] [address] [phone]        Burnell Blanks, MD .   Specialty:  Cardiology Contact information: [place] [address] [place] [address] [phone]          Allergies  Allergen Reactions  . Sulfa Antibiotics Rash    You were cared for by a hospitalist during your hospital stay. If you have any questions about your discharge medications or the care you received while you were in the hospital after you are discharged, you can call the unit and asked to speak with the hospitalist on call if the hospitalist that took care of you is not available. Once you are discharged, your primary care physician will handle any further medical issues. Please note that no refills for any discharge medications will be authorized once you are discharged, as it is imperative that you return to your primary care physician (or establish a relationship with a primary care physician if you do  not have one) for your aftercare needs so that they can reassess your need for medications and monitor your lab values.   Procedures/Studies: US Renal  Result Date: 12/16/2018 CLINICAL DATA:  78 year old male with history of acute kidney injury with elevated BUN and creatinine. History of hypertension. Diabetes. Chronic kidney disease. EXAM: RENAL / URINARY TRACT ULTRASOUND COMPLETE COMPARISON:  Renal ultrasound 06/14/2018. FINDINGS: Right Kidney: Renal measurements: 10.8 x 5.3 x 5.8 cm = volume: 174 mL . Echogenicity mildly increased. No mass. Mild hydronephrosis. Left Kidney: Renal measurements: 10.1 x 5.5 x 5.9 cm = volume: 170 mL. Echogenicity mildly increased. Limits. No mass. Mild hydronephrosis. Bladder: Urinary bladder is partially decompressed. There is some dependent echogenic material which does not shadow in the urinary bladder, concerning for internal debris. IMPRESSION: 1. Increased echogenicity in the kidneys bilaterally, suggesting medical renal disease. 2. Mild bilateral hydronephrosis, similar to prior CT 11/01/2018. 3. Echogenic nonshadowing material in the lumen of the urinary bladder, concerning for debris. 4.  Urinary bladder wall appears mildly thickened, this could be in part related to partial decompression, however, this is similar to recent noncontrast CT examination. Electronically Signed   By: Vinnie Langton M.D.   On: 12/16/2018 05:24      The results of significant diagnostics from this hospitalization (including imaging, microbiology, ancillary and laboratory) are listed below for reference.     Microbiology: Recent Results (from the past 240 hour(s))  Urine culture     Status: None   Collection Time: 12/15/18 10:26 PM  Result Value Ref Range Status   Specimen Description   Final    URINE, CLEAN CATCH Performed at [facility], [address] [place], [place] [address]    Special Requests   Final    Normal Performed at [facility], [address] [place], [place] [address]    Culture   Final    NO GROWTH Performed at [facility], [place] [address] [place], [place] [address]    Report Status 12/17/2018 FINAL  Final     Labs: BNP (last 3 results) Recent Labs    03/17/18 1148 05/06/18 0328  BNP 358.5* 235.5*   Basic Metabolic Panel: Recent Labs  Lab 12/15/18 2132 12/15/18 2133 12/16/18 0615 12/17/18 0500 12/18/18 0503 12/19/18 0341  NA 129* 132* 135 137 137 139  K 4.4 4.5 4.3 4.1 4.2 4.2  CL 94*  --  102 106 107 107  CO2 25  --  24 24 23 23   GLUCOSE 398*  --  224* 175* 145* 118*  BUN 34*  --  26* 20 16 18   CREATININE 2.24*  --  1.96* 1.67* 1.64* 1.77*  CALCIUM 9.3  --  8.9 8.8* 8.5* 8.5*  MG  --   --   --   --  1.9 1.7   Liver Function Tests: No results for input(s): AST, ALT, ALKPHOS, BILITOT, PROT, ALBUMIN in the last 168 hours. No results for input(s): LIPASE, AMYLASE in the last 168 hours. No results for input(s): AMMONIA in the last 168 hours. CBC: Recent Labs  Lab 12/15/18 2132 12/15/18 2133 12/17/18 0500 12/18/18 0503 12/19/18 0341  WBC 10.1  --  9.4 10.2 8.2  NEUTROABS  --   --  5.5  --   --   HGB 13.6 13.9  12.7* 12.4* 11.4*  HCT 41.1 41.0 37.7* 38.1* 35.6*  MCV 90.3  --  90.6 90.9 92.2  PLT 326  --  253  254 255   Cardiac Enzymes: No results for input(s): CKTOTAL, CKMB, CKMBINDEX, TROPONINI in the last 168 hours. BNP: Invalid input(s): POCBNP CBG: Recent Labs  Lab 12/18/18 0815 12/18/18 1129 12/18/18 1634 12/18/18 2105 12/19/18 0753  GLUCAP 157* 214* 168* 197* 122*   D-Dimer No results for input(s): DDIMER in the last 72 hours. Hgb A1c Recent Labs    12/17/18 0500  HGBA1C 10.6*   Lipid Profile No results for input(s): CHOL, HDL, LDLCALC, TRIG, CHOLHDL, LDLDIRECT in the last 72 hours. Thyroid function studies No results for input(s): TSH, T4TOTAL, T3FREE, THYROIDAB in the last 72 hours.  Invalid input(s): FREET3 Anemia work up No results for input(s): VITAMINB12, FOLATE, FERRITIN, TIBC, IRON, RETICCTPCT in the last 72 hours. Urinalysis    Component Value Date/Time   COLORURINE YELLOW 12/15/2018 2132   APPEARANCEUR CLOUDY (A) 12/15/2018 2132   LABSPEC 1.010 12/15/2018 2132   PHURINE 6.0 12/15/2018 2132   GLUCOSEU >=500 (A) 12/15/2018 2132   HGBUR MODERATE (A) 12/15/2018 2132   BILIRUBINUR NEGATIVE 12/15/2018 2132   KETONESUR NEGATIVE 12/15/2018 2132   PROTEINUR 30 (A) 12/15/2018 2132   NITRITE NEGATIVE 12/15/2018 2132   LEUKOCYTESUR MODERATE (A) 12/15/2018 2132   Sepsis Labs Invalid input(s): PROCALCITONIN,  WBC,  LACTICIDVEN Microbiology Recent Results (from the past 240 hour(s))  Urine culture     Status: None   Collection Time: 12/15/18 10:26 PM  Result Value Ref Range Status   Specimen Description   Final    URINE, CLEAN CATCH Performed at [facility], [address] [place], [place] [address]    Special Requests   Final    Normal Performed at [facility], [address] [place], [place] [address]    Culture   Final    NO GROWTH Performed at [facility], [place] [address], [place], [place] [address]    Report Status  12/17/2018 FINAL  Final     Time coordinating discharge:  I have spent 35 minutes face to face with the patient and on the ward discussing the patients care, assessment, plan and disposition with other care givers. >50% of the time was devoted counseling the patient about the risks and benefits of treatment/Discharge disposition and coordinating care.   SIGNED:   Damita Lack, MD  [facility] 12/19/2018, 9:10 AM   If 7PM-7AM, please contact night-coverage [email or web address]

## 2018-12-19 NOTE — Discharge Instructions (Signed)
Blood Glucose Monitoring, Adult  ***Check -Fasting blood sugar in the mornings, Lunch- 2 hours after meal, Dinner, and at bedtime. Write these values down and take with you to next PCP appointment.  Monitoring your blood sugar (glucose) is an important part of managing your diabetes (diabetes mellitus). Blood glucose monitoring involves checking your blood glucose as often as directed and keeping a record (log) of your results over time. Checking your blood glucose regularly and keeping a blood glucose log can:  Help you and your health care provider adjust your diabetes management plan as needed, including your medicines or insulin.  Help you understand how food, exercise, illnesses, and medicines affect your blood glucose.  Let you know what your blood glucose is at any time. You can quickly find out if you have low blood glucose (hypoglycemia) or high blood glucose (hyperglycemia). Your health care provider will set individualized treatment goals for you. Your goals will be based on your age, other medical conditions you have, and how you respond to diabetes treatment. Generally, the goal of treatment is to maintain the following blood glucose levels:  Before meals (preprandial): 80-130 mg/dL (4.4-7.2 mmol/L).  After meals (postprandial): below 180 mg/dL (10 mmol/L).  A1c level: less than 7%. Supplies needed:  Blood glucose meter.  Test strips for your meter. Each meter has its own strips. You must use the strips that came with your meter.  A needle to prick your finger (lancet). Do not use a lancet more than one time.  A device that holds the lancet (lancing device).  A journal or log book to write down your results. How to check your blood glucose  1. Wash your hands with soap and water. 2. Prick the side of your finger (not the tip) with the lancet. Use a different finger each time. 3. Gently rub the finger until a small drop of blood appears. 4. Follow instructions that come  with your meter for inserting the test strip, applying blood to the strip, and using your blood glucose meter. 5. Write down your result and any notes. Some meters allow you to use areas of your body other than your finger (alternative sites) to test your blood. The most common alternative sites are:  Forearm.  Thigh.  Palm of the hand. If you think you may have hypoglycemia, or if you have a history of not knowing when your blood glucose is getting low (hypoglycemia unawareness), do not use alternative sites. Use your finger instead. Alternative sites may not be as accurate as the fingers, because blood flow is slower in these areas. This means that the result you get may be delayed, and it may be different from the result that you would get from your finger. Follow these instructions at home: Blood glucose log   Every time you check your blood glucose, write down your result. Also write down any notes about things that may be affecting your blood glucose, such as your diet and exercise for the day. This information can help you and your health care provider: ? Look for patterns in your blood glucose over time. ? Adjust your diabetes management plan as needed.  Check if your meter allows you to download your records to a computer. Most glucose meters store a record of glucose readings in the meter. If you have type 1 diabetes:  Check your blood glucose 2 or more times a day.  Also check your blood glucose: ? Before every insulin injection. ? Before and after exercise. ?  Before meals. ? 2 hours after a meal. ? Occasionally between 2:00 a.m. and 3:00 a.m., as directed. ? Before potentially dangerous tasks, like driving or using heavy machinery. ? At bedtime.  You may need to check your blood glucose more often, up to 6-10 times a day, if you: ? Use an insulin pump. ? Need multiple daily injections (MDI). ? Have diabetes that is not well-controlled. ? Are ill. ? Have a history of  severe hypoglycemia. ? Have hypoglycemia unawareness. If you have type 2 diabetes:  If you take insulin or other diabetes medicines, check your blood glucose 2 or more times a day.  If you are on intensive insulin therapy, check your blood glucose 4 or more times a day. Occasionally, you may also need to check between 2:00 a.m. and 3:00 a.m., as directed.  Also check your blood glucose: ? Before and after exercise. ? Before potentially dangerous tasks, like driving or using heavy machinery.  You may need to check your blood glucose more often if: ? Your medicine is being adjusted. ? Your diabetes is not well-controlled. ? You are ill. General tips  Always keep your supplies with you.  If you have questions or need help, all blood glucose meters have a 24-hour "hotline" phone number that you can call. You may also contact your health care provider.  After you use a few boxes of test strips, adjust (calibrate) your blood glucose meter by following instructions that came with your meter. Contact a health care provider if:  Your blood glucose is at or above 240 mg/dL (13.3 mmol/L) for 2 days in a row.  You have been sick or have had a fever for 2 days or longer, and you are not getting better.  You have any of the following problems for more than 6 hours: ? You cannot eat or drink. ? You have nausea or vomiting. ? You have diarrhea. Get help right away if:  Your blood glucose is lower than 54 mg/dL (3 mmol/L).  You become confused or you have trouble thinking clearly.  You have difficulty breathing.  You have moderate or large ketone levels in your urine. Summary  Monitoring your blood sugar (glucose) is an important part of managing your diabetes (diabetes mellitus).  Blood glucose monitoring involves checking your blood glucose as often as directed and keeping a record (log) of your results over time.  Your health care provider will set individualized treatment goals for  you. Your goals will be based on your age, other medical conditions you have, and how you respond to diabetes treatment.  Every time you check your blood glucose, write down your result. Also write down any notes about things that may be affecting your blood glucose, such as your diet and exercise for the day. This information is not intended to replace advice given to you by your health care provider. Make sure you discuss any questions you have with your health care provider. Document Released: 09/16/2003 Document Revised: 07/25/2017 Document Reviewed: 02/23/2016 Elsevier Interactive Patient Education  2019 [place]. Insulin Glargine injection **For additional information- can go to the [employer]. ** What is this medicine? INSULIN GLARGINE (IN su lin GLAR geen) is a human-made form of insulin. This drug lowers the amount of sugar in your blood. It is a long-acting insulin that is usually given once a day. This medicine may be used for other purposes; ask your health care provider or pharmacist if you have questions. COMMON BRAND NAME(S): BASAGLAR,  Lantus, Lantus SoloStar, Toujeo Max SoloStar, [employer] What should I tell my health care provider before I take this medicine? They need to know if you have any of these conditions: -episodes of low blood sugar -eye disease, vision problems -kidney disease -liver disease -an unusual or allergic reaction to insulin, metacresol, other medicines, foods, dyes, or preservatives -pregnant or trying to get pregnant -breast-feeding How should I use this medicine? This medicine is for injection under the skin. Use this medicine at the same time each day. Use exactly as directed. This insulin should never be mixed in the same syringe with other insulins before injection. Do not vigorously shake before use. You will be taught how to use this medicine and how to adjust doses for activities and illness. Do not use more insulin than  prescribed. Always check the appearance of your insulin before using it. This medicine should be clear and colorless like water. Do not use it if it is cloudy, thickened, colored, or has solid particles in it. If you use an insulin pen, be sure to take off the outer needle cover before using the dose. It is important that you put your used needles and syringes in a special sharps container. Do not put them in a trash can. If you do not have a sharps container, call your pharmacist or healthcare provider to get one. Talk to your pediatrician regarding the use of this medicine in children. Special care may be needed. Overdosage: If you think you have taken too much of this medicine contact a poison control center or emergency room at once. NOTE: This medicine is only for you. Do not share this medicine with others. What if I miss a dose? It is important not to miss a dose. Your health care professional or doctor should discuss a plan for missed doses with you. If you do miss a dose, follow their plan. Do not take double doses. What may interact with this medicine? -other medicines for diabetes Many medications may cause changes in blood sugar, these include: -alcohol containing beverages -antiviral medicines for HIV or AIDS -aspirin and aspirin-like drugs -certain medicines for blood pressure, heart disease, irregular heart beat -chromium -diuretics -male hormones, such as estrogens or progestins, birth control pills -fenofibrate -gemfibrozil -isoniazid -lanreotide -male hormones or anabolic steroids -MAOIs like Carbex, Eldepryl, Marplan, Nardil, and Parnate -medicines for weight loss -medicines for allergies, asthma, cold, or cough -medicines for depression, anxiety, or psychotic disturbances -niacin -nicotine -NSAIDs, medicines for pain and inflammation, like ibuprofen or naproxen -octreotide -pasireotide -pentamidine -phenytoin -probenecid -quinolone antibiotics such as  ciprofloxacin, levofloxacin, ofloxacin -some herbal dietary supplements -steroid medicines such as prednisone or cortisone -sulfamethoxazole; trimethoprim -thyroid hormones Some medications can hide the warning symptoms of low blood sugar (hypoglycemia). You may need to monitor your blood sugar more closely if you are taking one of these medications. These include: -beta-blockers, often used for high blood pressure or heart problems (examples include atenolol, metoprolol, propranolol) -clonidine -guanethidine -reserpine This list may not describe all possible interactions. Give your health care provider a list of all the medicines, herbs, non-prescription drugs, or dietary supplements you use. Also tell them if you smoke, drink alcohol, or use illegal drugs. Some items may interact with your medicine. What should I watch for while using this medicine? Visit your health care professional or doctor for regular checks on your progress. Do not drive, use machinery, or do anything that needs mental alertness until you know how this medicine affects you. Alcohol  may interfere with the effect of this medicine. Avoid alcoholic drinks. A test called the HbA1C (A1C) will be monitored. This is a simple blood test. It measures your blood sugar control over the last 2 to 3 months. You will receive this test every 3 to 6 months. Learn how to check your blood sugar. Learn the symptoms of low and high blood sugar and how to manage them. Always carry a quick-source of sugar with you in case you have symptoms of low blood sugar. Examples include hard sugar candy or glucose tablets. Make sure others know that you can choke if you eat or drink when you develop serious symptoms of low blood sugar, such as seizures or unconsciousness. They must get medical help at once. Tell your doctor or health care professional if you have high blood sugar. You might need to change the dose of your medicine. If you are sick or  exercising more than usual, you might need to change the dose of your medicine. Do not skip meals. Ask your doctor or health care professional if you should avoid alcohol. Many nonprescription cough and cold products contain sugar or alcohol. These can affect blood sugar. Make sure that you have the right kind of syringe for the type of insulin you use. Try not to change the brand and type of insulin or syringe unless your health care professional or doctor tells you to. Switching insulin brand or type can cause dangerously high or low blood sugar. Always keep an extra supply of insulin, syringes, and needles on hand. Use a syringe one time only. Throw away syringe and needle in a closed container to prevent accidental needle sticks. Insulin pens and cartridges should never be shared. Even if the needle is changed, sharing may result in passing of viruses like hepatitis or HIV. Each time you get a new box of pen needles, check to see if they are the same type as the ones you were trained to use. If not, ask your health care professional to show you how to use this new type properly. Wear a medical ID bracelet or chain, and carry a card that describes your disease and details of your medicine and dosage times. What side effects may I notice from receiving this medicine? Side effects that you should report to your doctor or health care professional as soon as possible: -allergic reactions like skin rash, itching or hives, swelling of the face, lips, or tongue -breathing problems -signs and symptoms of high blood sugar such as dizziness, dry mouth, dry skin, fruity breath, nausea, stomach pain, increased hunger or thirst, increased urination -signs and symptoms of low blood sugar such as feeling anxious, confusion, dizziness, increased hunger, unusually weak or tired, sweating, shakiness, cold, irritable, headache, blurred vision, fast heartbeat, loss of consciousness Side effects that usually do not  require medical attention (report to your doctor or health care professional if they continue or are bothersome): -increase or decrease in fatty tissue under the skin due to overuse of a particular injection site -itching, burning, swelling, or rash at site where injected This list may not describe all possible side effects. Call your doctor for medical advice about side effects. You may report side effects to FDA at 1-800-FDA-1088. Where should I keep my medicine? Keep out of the reach of children. Store unopened vials in a refrigerator between 2 and 8 degrees C (36 and 46 degrees F). Do not freeze or use if the insulin has been frozen. Opened vials (vials  currently in use) may be stored in the refrigerator or at room temperature, at approximately 25 degrees C (77 degrees F) or cooler. Keeping your insulin at room temperature decreases the amount of pain during injection. Once opened, your insulin can be used for 28 days. After 28 days, the vial should be thrown away. Store Lantus [employer] in a refrigerator between 2 and 8 degrees C (36 and 46 degrees F) or at room temperature below 30 degrees C (86 degrees F). Do not freeze or use if the insulin has been frozen. Once opened, the pens should be kept at room temperature. Do not store in the refrigerator once opened. Once opened, the insulin can be used for 28 days. After 28 days, the Lantus Solostar Pen or Basaglar KwikPen should be thrown away. Store Toujeo and [employer] in a refrigerator between 2 and 8 degrees C (36 and 46 degrees F). Do not freeze or use if the insulin has been frozen. Once opened, the pens should be kept at room temperature below 30 degrees C (86 degrees F). Do not store in the refrigerator once opened. Once opened, the insulin can be used for 56 days. After 56 days, the Toujeo or [employer] Pen should be thrown away. Protect from light and excessive heat. Throw away any unused medicine  after the expiration date or after the specified time for room temperature storage has passed. NOTE: This sheet is a summary. It may not cover all possible information. If you have questions about this medicine, talk to your doctor, pharmacist, or health care provider.  2019 Elsevier/Gold Standard (2018-03-16 15:06:27) Insulin Injection Instructions, Using Insulin Pens, Adult A subcutaneous injection is a shot of medicine that is injected into the layer of fat and tissue between skin and muscle. People with type 1 diabetes must take insulin because their bodies do not make it. People with type 2 diabetes may need to take insulin. There are many different types of insulin. The type of insulin that you take may determine how many injections you give yourself and when you need to give the injections. Supplies needed:  Soap and water to wash hands.  Your insulin pen.  A new, unused needle.  Alcohol wipes.  A disposal container that is meant for sharp items (sharps container), such as an empty plastic bottle with a cover. How to choose a site for injection The body absorbs insulin differently, depending on where the insulin is injected (injection site). It is best to inject insulin into the same body area each time (for example, always in the abdomen), but you should use a different spot in that area for each injection. Do not inject the insulin in the same spot each time. There are five main areas that can be used for injecting. These areas include:  Abdomen. This is the preferred area.  Front of thigh.  Upper, outer side of thigh.  Upper, outer side of arm.  Upper, outer part of buttock. How to use an insulin pen  First, follow the steps for Get ready, then continue with the steps for Inject the insulin. Get ready 1. Wash your hands with soap and water. If soap and water are not available, use hand sanitizer. 2. Before you give yourself an insulin injection, be sure to test your blood  sugar level (blood glucose level) and write down that number. Follow any instructions from your health care provider about what to do if your blood glucose  level is higher or lower than your normal range. 3. Check the expiration date and the type of insulin that is in the pen. 4. If you are using CLEAR insulin, check to see that it is clear and free of clumps. ?  5. Remove the cap from the insulin pen. 6. Use an alcohol wipe to clean the rubber tip of the pen. 7. Remove the protective paper tab from the disposable needle. Do not let the needle touch anything. 8. Screw a new, unused needle onto the pen. 9. Remove the outer plastic needle cover. Do not throw away the outer plastic cover yet. ? If the pen uses a special safety needle, leave the inner needle shield in place. ? If the pen does not use a special safety needle, remove the inner plastic cover from the needle. 10. Follow the manufacturer's instructions to prime the insulin pen with the volume of insulin needed. Hold the pen with the needle pointing up, and push the button on the opposite end of the pen until a drop of insulin appears at the needle tip. If no insulin appears, repeat this step. 11. Turn the button (dial) to the number of units of insulin that you will be injecting. Inject the insulin 1. Use an alcohol wipe to clean the site where you will be injecting the needle. Let the site air-dry. 2. Hold the pen in the palm of your writing hand like a pencil. 3. If directed by your health care provider, use your other hand to pinch and hold about an inch (2.5 cm) of skin at the injection site. Do not directly touch the cleaned part of the skin. 4. Gently but quickly, use your writing hand to put the needle straight into the skin. The needle should be at a 90-degree angle (perpendicular) to the skin. 5. When the needle is completely inserted into the skin, use your thumb or index finger of your writing hand to push the top button of the  pen down all the way to inject the insulin. 6. Let go of the skin that you are pinching. Continue to hold the pen in place with your writing hand. 7. Wait 10 seconds, then pull the needle straight out of the skin. This will allow all of the insulin to go from the pen and needle into your body. 8. Carefully put the larger (outer) plastic cover of the needle back over the needle, then unscrew the capped needle and discard it in a sharps container, such as an empty plastic bottle with a cover. 9. Put the plastic cap back on the insulin pen. How to throw away supplies  Discard all used needles in a puncture-proof sharps disposal container. You can ask your local pharmacy about where you can get this kind of disposal container, or you can use an empty plastic liquid laundry detergent bottle that has a cover.  Follow the disposal regulations for the area where you live. Do not use any needle more than one time.  Throw away empty disposable pens in the regular trash. Questions to ask your health care provider  How often should I be taking insulin?  How often should I check my blood glucose?  What amount of insulin should I be taking at each time?  What are the side effects?  What should I do if my blood glucose is too high?  What should I do if my blood glucose is too low?  What should I do if I forget to take  my insulin?  What number should I call if I have questions? Where to find more information  American Diabetes Association (ADA): www.diabetes.org  American Association of Diabetes Educators (AADE) Patient Resources: https://www.diabeteseducator.org Summary  A subcutaneous injection is a shot of medicine that is injected into the layer of fat and tissue between skin and muscle.  Before you give yourself an insulin injection, be sure to test your blood sugar level (blood glucose level) and write down that number.  Check the expiration date and the type of insulin that is in the  pen. The type of insulin that you take may determine how many injections you give yourself and when you need to give the injections.  It is best to inject insulin into the same body area each time (for example, always in the abdomen), but you should use a different spot in that area for each injection. This information is not intended to replace advice given to you by your health care provider. Make sure you discuss any questions you have with your health care provider. Document Released: 10/17/2015 Document Revised: 10/03/2017 Document Reviewed: 10/17/2015 Elsevier Interactive Patient Education  2019 [place]. Hyperglycemia Hyperglycemia occurs when the level of sugar (glucose) in the blood is too high. Glucose is a type of sugar that provides the body's main source of energy. Certain hormones (insulin and glucagon) control the level of glucose in the blood. Insulin lowers blood glucose, and glucagon increases blood glucose. Hyperglycemia can result from having too little insulin in the bloodstream, or from the body not responding normally to insulin. Hyperglycemia occurs most often in people who have diabetes (diabetes mellitus), but it can happen in people who do not have diabetes. It can develop quickly, and it can be life-threatening if it causes you to become severely dehydrated (diabetic ketoacidosis or hyperglycemic hyperosmolar state). Severe hyperglycemia is a medical emergency. What are the causes? If you have diabetes, hyperglycemia may be caused by:  Diabetes medicine.  Medicines that increase blood glucose or affect your diabetes control.  Not eating enough, or not eating often enough.  Changes in physical activity level.  Being sick or having an infection. If you have prediabetes or undiagnosed diabetes:  Hyperglycemia may be caused by those conditions. If you do not have diabetes, hyperglycemia may be caused by:  Certain medicines, including steroid medicines,  beta-blockers, epinephrine, and thiazide diuretics.  Stress.  Serious illness.  Surgery.  Diseases of the pancreas.  Infection. What increases the risk? Hyperglycemia is more likely to develop in people who have risk factors for diabetes, such as:  Having a family member with diabetes.  Having a gene for type 1 diabetes that is passed from parent to child (inherited).  Living in an area with cold weather conditions.  Exposure to certain viruses.  Certain conditions in which the body's disease-fighting (immune) system attacks itself (autoimmune disorders).  Being overweight or obese.  Having an inactive (sedentary) lifestyle.  Having been diagnosed with insulin resistance.  Having a history of prediabetes, gestational diabetes, or polycystic ovarian syndrome (PCOS).  Being of American-Indian, African-American, Hispanic/Latino, or Asian/Pacific Islander descent. What are the signs or symptoms? Hyperglycemia may not cause any symptoms. If you do have symptoms, they may include early warning signs, such as:  Increased thirst.  Hunger.  Feeling very tired.  Needing to urinate more often than usual.  Blurry vision. Other symptoms may develop if hyperglycemia gets worse, such as:  Dry mouth.  Loss of appetite.  Fruity-smelling breath.  Weakness.  Unexpected or rapid weight gain or weight loss.  Tingling or numbness in the hands or feet.  Headache.  Skin that does not quickly return to normal after being lightly pinched and released (poor skin turgor).  Abdominal pain.  Cuts or bruises that are slow to heal. How is this diagnosed? Hyperglycemia is diagnosed with a blood test to measure your blood glucose level. This blood test is usually done while you are having symptoms. Your health care provider may also do a physical exam and review your medical history. You may have more tests to determine the cause of your hyperglycemia, such as:  A fasting blood  glucose (FBG) test. You will not be allowed to eat (you will fast) for at least 8 hours before a blood sample is taken.  An A1c (hemoglobin A1c) blood test. This provides information about blood glucose control over the previous 2-3 months.  An oral glucose tolerance test (OGTT). This measures your blood glucose at two times: ? After fasting. This is your baseline blood glucose level. ? Two hours after drinking a beverage that contains glucose. How is this treated? Treatment depends on the cause of your hyperglycemia. Treatment may include:  Taking medicine to regulate your blood glucose levels. If you take insulin or other diabetes medicines, your medicine or dosage may be adjusted.  Lifestyle changes, such as exercising more, eating healthier foods, or losing weight.  Treating an illness or infection, if this caused your hyperglycemia.  Checking your blood glucose more often.  Stopping or reducing steroid medicines, if these caused your hyperglycemia. If your hyperglycemia becomes severe and it results in hyperglycemic hyperosmolar state, you must be hospitalized and given IV fluids. Follow these instructions at home:  General instructions  Take over-the-counter and prescription medicines only as told by your health care provider.  Do not use any products that contain nicotine or tobacco, such as cigarettes and e-cigarettes. If you need help quitting, ask your health care provider.  Limit alcohol intake to no more than 1 drink per day for nonpregnant women and 2 drinks per day for men. One drink equals 12 oz of beer, 5 oz of wine, or 1 oz of hard liquor.  Learn to manage stress. If you need help with this, ask your health care provider.  Keep all follow-up visits as told by your health care provider. This is important. Eating and drinking   Maintain a healthy weight.  Exercise regularly, as directed by your health care provider.  Stay hydrated, especially when you exercise,  get sick, or spend time in hot temperatures.  Eat healthy foods, such as: ? Lean proteins. ? Complex carbohydrates. ? Fresh fruits and vegetables. ? Low-fat dairy products. ? Healthy fats.  Drink enough fluid to keep your urine clear or pale yellow. If you have diabetes:  Make sure you know the symptoms of hyperglycemia.  Follow your diabetes management plan, as told by your health care provider. Make sure you: ? Take your insulin and medicines as directed. ? Follow your exercise plan. ? Follow your meal plan. Eat on time, and do not skip meals. ? Check your blood glucose as often as directed. Make sure to check your blood glucose before and after exercise. If you exercise longer or in a different way than usual, check your blood glucose more often. ? Follow your sick day plan whenever you cannot eat or drink normally. Make this plan in advance with your health care provider.  Share your diabetes  management plan with people in your workplace, school, and household.  Check your urine for ketones when you are ill and as told by your health care provider.  Carry a medical alert card or wear medical alert jewelry. Contact a health care provider if:  Your blood glucose is at or above 240 mg/dL (13.3 mmol/L) for 2 days in a row.  You have problems keeping your blood glucose in your target range.  You have frequent episodes of hyperglycemia. Get help right away if:  You have difficulty breathing.  You have a change in how you think, feel, or act (mental status).  You have nausea or vomiting that does not go away. These symptoms may represent a serious problem that is an emergency. Do not wait to see if the symptoms will go away. Get medical help right away. Call your local emergency services (911 in the [place] Do not drive yourself to the hospital. Summary  Hyperglycemia occurs when the level of sugar (glucose) in the blood is too high.  Hyperglycemia is diagnosed with a blood  test to measure your blood glucose level. This blood test is usually done while you are having symptoms. Your health care provider may also do a physical exam and review your medical history.  If you have diabetes, follow your diabetes management plan as told by your health care provider.  Contact your health care provider if you have problems keeping your blood glucose in your target range. This information is not intended to replace advice given to you by your health care provider. Make sure you discuss any questions you have with your health care provider. Document Released: 03/09/2001 Document Revised: 05/31/2016 Document Reviewed: 05/31/2016 Elsevier Interactive Patient Education  2019 [employer]. Hypoglycemia Hypoglycemia is when the sugar (glucose) level in your blood is too low. Signs of low blood sugar may include:  Feeling: ? Hungry. ? Worried or nervous (anxious). ? Sweaty and clammy. ? Confused. ? Dizzy. ? Sleepy. ? Sick to your stomach (nauseous).  Having: ? A fast heartbeat. ? A headache. ? A change in your vision. ? Tingling or no feeling (numbness) around your mouth, lips, or tongue. ? Jerky movements that you cannot control (seizure).  Having trouble with: ? Moving (coordination). ? Sleeping. ? Passing out (fainting). ? Getting upset easily (irritability). Low blood sugar can happen to people who have diabetes and people who do not have diabetes. Low blood sugar can happen quickly, and it can be an emergency. Treating low blood sugar Low blood sugar is often treated by eating or drinking something sugary right away, such as:  Fruit juice, 4-6 oz (120-150 mL).  Regular soda (not diet soda), 4-6 oz (120-150 mL).  Low-fat milk, 4 oz (120 mL).  Several pieces of hard candy.  Sugar or honey, 1 Tbsp (15 mL). Treating low blood sugar if you have diabetes If you can think clearly and swallow safely, follow the 15:15 rule:  Take 15 grams of a fast-acting  carb (carbohydrate). Talk with your doctor about how much you should take.  Always keep a source of fast-acting carb with you, such as: ? Sugar tablets (glucose pills). Take 3-4 pills. ? 6-8 pieces of hard candy. ? 4-6 oz (120-150 mL) of fruit juice. ? 4-6 oz (120-150 mL) of regular (not diet) soda. ? 1 Tbsp (15 mL) honey or sugar.  Check your blood sugar 15 minutes after you take the carb.  If your blood sugar is still at or below 70 mg/dL (  3.9 mmol/L), take 15 grams of a carb again.  If your blood sugar does not go above 70 mg/dL (3.9 mmol/L) after 3 tries, get help right away.  After your blood sugar goes back to normal, eat a meal or a snack within 1 hour.  Treating very low blood sugar If your blood sugar is at or below 54 mg/dL (3 mmol/L), you have very low blood sugar (severe hypoglycemia). This may also cause:  Passing out.  Jerky movements you cannot control (seizure).  Losing consciousness (coma). This is an emergency. Do not wait to see if the symptoms will go away. Get medical help right away. Call your local emergency services (911 in the [place] Do not drive yourself to the hospital. If you have very low blood sugar and you cannot eat or drink, you may need a glucagon shot (injection). A family member or friend should learn how to check your blood sugar and how to give you a glucagon shot. Ask your doctor if you need to have a glucagon shot kit at home. Follow these instructions at home: General instructions  Take over-the-counter and prescription medicines only as told by your doctor.  Stay aware of your blood sugar as told by your doctor.  Limit alcohol intake to no more than 1 drink a day for nonpregnant women and 2 drinks a day for men. One drink equals 12 oz of beer (355 mL), 5 oz of wine (148 mL), or 1 oz of hard liquor (44 mL).  Keep all follow-up visits as told by your doctor. This is important. If you have diabetes:   Follow your diabetes care plan as  told by your doctor. Make sure you: ? Know the signs of low blood sugar. ? Take your medicines as told. ? Follow your exercise and meal plan. ? Eat on time. Do not skip meals. ? Check your blood sugar as often as told by your doctor. Always check it before and after exercise. ? Follow your sick day plan when you cannot eat or drink normally. Make this plan ahead of time with your doctor.  Share your diabetes care plan with: ? Your work or school. ? People you live with.  Check your pee (urine) for ketones: ? When you are sick. ? As told by your doctor.  Carry a card or wear jewelry that says you have diabetes. Contact a doctor if:  You have trouble keeping your blood sugar in your target range.  You have low blood sugar often. Get help right away if:  You still have symptoms after you eat or drink something sugary.  Your blood sugar is at or below 54 mg/dL (3 mmol/L).  You have jerky movements that you cannot control.  You pass out. These symptoms may be an emergency. Do not wait to see if the symptoms will go away. Get medical help right away. Call your local emergency services (911 in the [place] Do not drive yourself to the hospital. Summary  Hypoglycemia happens when the level of sugar (glucose) in your blood is too low.  Low blood sugar can happen to people who have diabetes and people who do not have diabetes. Low blood sugar can happen quickly, and it can be an emergency.  Make sure you know the signs of low blood sugar and know how to treat it.  Always keep a source of sugar (fast-acting carb) with you to treat low blood sugar. This information is not intended to replace advice given to  you by your health care provider. Make sure you discuss any questions you have with your health care provider. Document Released: 12/08/2009 Document Revised: 03/07/2018 Document Reviewed: 10/17/2015 Elsevier Interactive Patient Education  2019 [employer].

## 2018-12-19 NOTE — Progress Notes (Signed)
Inpatient Diabetes Program Recommendations  AACE/ADA: New Consensus Statement on Inpatient Glycemic Control (2015)  Target Ranges:  Prepandial:   less than 140 mg/dL      Peak postprandial:   less than 180 mg/dL (1-2 hours)      Critically ill patients:  140 - 180 mg/dL   Lab Results  Component Value Date   GLUCAP 122 (H) 12/19/2018   HGBA1C 10.6 (H) 12/17/2018    Review of Glycemic Control Results for Kevin [name], Kevin [name] "NITESH PITSTICK" (MRN [id number]) as of 12/19/2018 10:19  Ref. 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   Educated patient on insulin pen use at home. Reviewed contents of insulin flexpen starter kit. Reviewed all steps if insulin pen including attachment of needle, 2-unit air shot, dialing up dose, giving injection, removing needle, disposal of sharps, storage of unused insulin, disposal of insulin etc. Patient able to provide successful return demonstration. Also reviewed troubleshooting with insulin pen. MD to give patient Rxs for insulin pens (Lantus) and insulin pen needles [phone]).  Patient has a meter and supplies. Reviewed instructions for monitoring and stressed the importance of following up with PCP, educated on when to call MD, and reviewed survival skills. Patient able to verbalize interventions. Patient plans to follow up with PCP. Patient has no further questions.  Thanks, Bronson Curb, MSN, RNC-OB Diabetes  Coordinator [phone] (8a-5p)

## 2018-12-26 DIAGNOSIS — I951 Orthostatic hypotension: Secondary | ICD-10-CM | POA: Diagnosis not present

## 2018-12-26 DIAGNOSIS — I10 Essential (primary) hypertension: Secondary | ICD-10-CM | POA: Diagnosis not present

## 2018-12-26 DIAGNOSIS — E1169 Type 2 diabetes mellitus with other specified complication: Secondary | ICD-10-CM | POA: Diagnosis not present

## 2018-12-26 DIAGNOSIS — N390 Urinary tract infection, site not specified: Secondary | ICD-10-CM | POA: Diagnosis not present

## 2019-01-03 DIAGNOSIS — E1169 Type 2 diabetes mellitus with other specified complication: Secondary | ICD-10-CM | POA: Diagnosis not present

## 2019-01-03 DIAGNOSIS — I10 Essential (primary) hypertension: Secondary | ICD-10-CM | POA: Diagnosis not present

## 2019-01-03 DIAGNOSIS — R8290 Unspecified abnormal findings in urine: Secondary | ICD-10-CM | POA: Diagnosis not present

## 2019-01-04 ENCOUNTER — Other Ambulatory Visit: Payer: Self-pay | Admitting: *Deleted

## 2019-01-04 ENCOUNTER — Encounter: Payer: Self-pay | Admitting: *Deleted

## 2019-01-04 DIAGNOSIS — R8290 Unspecified abnormal findings in urine: Secondary | ICD-10-CM | POA: Diagnosis not present

## 2019-01-04 NOTE — Patient Outreach (Signed)
[place] [facility]) Care Management  01/04/2019  Donaven Criswell [birth year] [id number]   CSW made an initial attempt to try and contact patient today to perform the initial phone assessment, as well as assess and assist with social work needs and services, without success.  A HIPAA compliant message was left for patient on voicemail.  CSW is currently awaiting a return call.  CSW will make a second outreach attempt within the next 3-4 business days, if a return call is not received from patient in the meantime.  CSW will also mail an Outreach Letter to patient's home requesting that patient contact CSW if patient is interested in receiving social work services through [place] with Scientist, clinical (histocompatibility and immunogenetics).  Nat Christen, BSW, MSW, LCSW  Licensed Education officer, environmental Health System  Mailing [place] [address], [place], [place] [address] Physical Address-[address], [place], [place] [address] Toll Free Main # [phone] Fax # [phone] Cell # [phone]  Office # [phone] [email or web address]

## 2019-01-10 ENCOUNTER — Other Ambulatory Visit: Payer: Self-pay | Admitting: *Deleted

## 2019-01-10 NOTE — Patient Outreach (Signed)
[place] [facility]) Care Management  01/10/2019  Kevin [name] [birth year] [id number]  [place] made a second attempt to try and contact patient today to perform phone assessment, as well as assess and assist with social work needs and services, without success.  A HIPAA compliant message was left for patient on voicemail.  CSW continues to await a return call.  CSW will make a third and final outreach attempt within the next 3-4 business days, if a return call is not received from patient in the meantime.  CSW will then proceed with case closure if a return call is not received from patient with a total of 10 business days, as required number of phone attempts will have been made and outreach letter mailed.   Nat Christen, BSW, MSW, LCSW  Licensed Education officer, environmental Health System  Mailing [place] [address], [place], [place] [address] Physical Address-[address], [place], [place] [address] Toll Free Main # [phone] Fax # [phone] Cell # [phone]  Office # [phone] [email or web address]

## 2019-01-11 DIAGNOSIS — E1169 Type 2 diabetes mellitus with other specified complication: Secondary | ICD-10-CM | POA: Diagnosis not present

## 2019-01-11 DIAGNOSIS — R8290 Unspecified abnormal findings in urine: Secondary | ICD-10-CM | POA: Diagnosis not present

## 2019-01-11 DIAGNOSIS — I10 Essential (primary) hypertension: Secondary | ICD-10-CM | POA: Diagnosis not present

## 2019-01-18 ENCOUNTER — Other Ambulatory Visit: Payer: Self-pay | Admitting: *Deleted

## 2019-01-18 NOTE — Patient Outreach (Signed)
[place] [facility]) Care Management  01/18/2019  Kevin [name] [birth year] [id number]    [place] made a third and final attempt to try and contact patient today to perform phone assessment, as well as assess and assist with social work needs and services, without success.  A HIPAA compliant message was left for patient on voicemail.  CSW is currently awaiting a return call.  CSW will proceed with case closure in two business days, if a return call is not received in the meantime, as required number of phone attempts have been made and an outreach letter was mailed to patient's home allowing 10 business days for a response.  Nat Christen, BSW, MSW, LCSW  Licensed Education officer, environmental Health System  Mailing [place] [address], [place], [place] [address] Physical Address-[address], [place], [place] [address] Toll Free Main # [phone] Fax # [phone] Cell # [phone]  Office # [phone] [email or web address]

## 2019-01-22 ENCOUNTER — Encounter: Payer: Self-pay | Admitting: *Deleted

## 2019-01-22 ENCOUNTER — Other Ambulatory Visit: Payer: Self-pay | Admitting: Cardiovascular Disease

## 2019-01-22 ENCOUNTER — Other Ambulatory Visit: Payer: Self-pay | Admitting: *Deleted

## 2019-01-22 NOTE — Patient Outreach (Signed)
[place] [facility]) Care Management  01/22/2019  Kevin [name] July 14, 1941 [id number]   [place] will perform a case closure on patient, due to inability to establish initial phone contact with patient, despite required number of phone attempts made and outreach letter mailed to patient's home, allowing 10 business days for a response.  CSW will fax an update to patient's Primary Care Physician, Dr. [name] to ensure that they are aware of CSW's involvement with patient's plan of care.    Nat Christen, BSW, MSW, LCSW  Licensed Education officer, environmental Health System  Mailing [place] [address] [place], [place] [address] Physical Address-[address], [place], [place] [address] Toll Free Main # [phone] Fax # [phone] Cell # [phone]  Office # [phone] [email or web address]

## 2019-01-26 DIAGNOSIS — I10 Essential (primary) hypertension: Secondary | ICD-10-CM | POA: Diagnosis not present

## 2019-01-26 DIAGNOSIS — Z794 Long term (current) use of insulin: Secondary | ICD-10-CM | POA: Diagnosis not present

## 2019-01-26 DIAGNOSIS — E1169 Type 2 diabetes mellitus with other specified complication: Secondary | ICD-10-CM | POA: Diagnosis not present

## 2019-02-08 ENCOUNTER — Telehealth: Payer: Self-pay

## 2019-02-12 NOTE — Telephone Encounter (Signed)
Patient needs to be call to r/s with Dr. Leonie Man. Janett Billow NP is out of office week of May 25-May 28.

## 2019-02-12 NOTE — Telephone Encounter (Addendum)
If pt calls back please reschedule him with Dr Leonie Man this week for a video visit. His appt will be cancel with JEssica NP because she will not be in the office that week. Need verbal consent to do video and file insurance.  VM was left on pts cell phone that provider will be out and to schedule with Dr. Leonie Man.

## 2019-02-15 NOTE — Telephone Encounter (Signed)
Pt called back today. To late to schedule with Dr. Leonie Man this week. Pt was r/s with Gertie Gowda., NP at the end of June.  Pt understands that although there may be some limitations with this type of visit, we will take all precautions to reduce any security or privacy concerns.  Pt understands that this will be treated like an in office visit and we will file with pt's insurance, and there may be a patient responsible charge related to this service. Link sent to pt to the confirmed e-mail in chart.

## 2019-02-20 ENCOUNTER — Ambulatory Visit: Payer: Medicare Other | Admitting: Adult Health

## 2019-03-22 NOTE — Telephone Encounter (Signed)
I called pt to update his med list, pcp and allergies. ALso pt recommend link be email again. Rn email doxy link to pt today for visit on Monday. I updated meds,allergies and pcp with wife and pt. They both verbalized understanding to click link 10 minutes prior to appt.

## 2019-03-26 ENCOUNTER — Other Ambulatory Visit: Payer: Self-pay

## 2019-03-26 ENCOUNTER — Ambulatory Visit: Payer: Medicare Other | Admitting: Adult Health

## 2019-03-26 NOTE — Progress Notes (Deleted)
[facility] [address] [place]. [place] [address] [phone]       FOLLOW UP NOTE  Mr. Oluwatomiwa Kinyon Date of Birth:  06/07/1941 Medical Record Number:  [id number]   Reason for visit: Strokelike episode status post IV TPA in 02/2018  Virtual Visit via Video Note  I connected with [name] on 03/26/19 at  9:45 AM EDT by a video enabled telemedicine application located remotely in my own home and verified that I am speaking with the correct person using two identifiers who was located at their own home.  He initially had face-to-face office visit scheduled today but due to COVID-19 safety precautions, visit transition to telemedicine via MyChart with patient's consent.   I discussed the limitations of evaluation and management by telemedicine and the availability of in person appointments. The patient expressed understanding and agreed to proceed.    CHIEF COMPLAINT:  No chief complaint on file.   HPI:  Hospital summary 03/16/2018: Mr. Gurman Ashland is a 78 y.o. male with history of HTN and DB who presented with L sided weakness, L facial droop and dysphagia.  CT head reviewed and showed no acute stroke.  Patient did receive TPA without complication.  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  Repeat CT scan 24 hours after TPA negative for acute infarct.  MRI head reviewed was negative for acute infarct but did show small vessel disease.  2D echo showed an EF of 60 to 65% without cardiac source of embolus.  LDL satisfactory at 39 and recommended continuation of current statin.  A1c elevated at 7.6 and recommended tight glycemic control with close PCP follow-up.  HTN stable after medication adjustment and recommended BP goal normotensive.  Patient was previously on aspirin 81 mg PTA and recommended to continue this but throughout hospital stay patient was found to develop new  onset atrial fibrillation and recommended management with Eliquis.  Patient was discharged to [facility] for continuation of therapies.  05/03/2018 visit: Patient is being seen today for hospital follow-up and is accompanied by his wife.  He states he has been doing well with no residual stroke deficits.  He continues to be compliant with Eliquis without side effects of bleeding or bruising.  Continues to take Crestor without side effects myalgias.  Blood pressure today 102/54 and per patient, this is typically his normal as he monitors this at home.  Does check glucose levels at home which have been stable.  Recently diagnosed with CHF and was started on Lasix which has improved his shortness of breath and his weight has been consistent.  He will be undergoing cardioversion on Friday.  He does have a follow-up with [name] Given on 05/04/2018 for follow-up of left metatarsal amputation.  He states this is been doing well, continues to wear the boot and has been using rolling walker for ambulation.  Denies new or worsening stroke/TIA symptoms.  08/21/2018 visit: Patient is being seen today for 31-monthfollow-up visit.  He continues to do well without recurring symptoms or residual deficits.  He continues to take Eliquis without side effects of bleeding or bruising.  Continues to take Crestor without side effects myalgias.  Blood pressure today satisfactory 132/64. Monitors glucose levels at home which have been satisfactory.  He continues to stay active with walking and denies any complications. He plans on returning to fitness center soon but is waiting to be cleared  by all providers. Occasional shortness of breath with CHF especially with increased exertion but denies debilitating and is aware of slowing down activity. No further concerns. Denies new or worsening stroke/TIA symptoms.   03/26/2019 VIRTUAL VISIT: Mr. Heroux is a 78 year old male who is being seen today for follow-up regarding strokelike episode in 02/2018.  He  has been stable from a stroke standpoint without new or recurring stroke/TIA symptoms.  Continues on Eliquis and Crestor without reported side effects for secondary stroke prevention.  Blood pressure ***.  Glucose levels ***.     ROS:   14 system review of systems performed and negative with exception of no complaints   PMH:  Past Medical History:  Diagnosis Date   A-fib ([place]) 03/20/2018   Acute blood loss anemia    Benign essential HTN    Benign prostatic hyperplasia with urinary retention    Cerebrovascular accident (CVA) ([place])    CKD (chronic kidney disease), stage III ([place])    Diabetes ([place]) 03/20/2018   Diabetes mellitus type 2 in nonobese [facility])    Essential hypertension 03/20/2018   Former tobacco use    H/O ulcerative colitis 1993   Hypertension    Hypokalemia    Leukocytosis    New onset atrial fibrillation ([place])    Osteomyelitis ([place])    a. 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   PAF (paroxysmal atrial fibrillation) ([place])    a. dx 03/2018 in setting of stroke, osteomyelitis.   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[place])    Stroke ([place]) 03/2018   Stroke-like episode ([place]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 03/16/2018   Tachycardia 03/20/2018   Wrist fracture    Left    PSH:  Past Surgical History:  Procedure Laterality Date   AMPUTATION Left 03/24/2018   Procedure: Transmetatarsal Amputation Left Foot;  Surgeon: [name], MD;  Location: [place];  Service: Orthopedics;  Laterality: Left;   CARDIOVERSION N/A 05/05/2018   Procedure: CARDIOVERSION;  Surgeon: [name], MD;  Location: [facility];  Service: Cardiovascular;  Laterality: N/A;   COLONOSCOPY  12/2017   benign, remission from ulcerative colitis in 90s   I&D EXTREMITY Left 03/17/2018   Procedure: IRRIGATION AND DEBRIDEMENT FOOT;  Surgeon: [name], MD;  Location: [place];  Service:  Orthopedics;  Laterality: Left;   STUMP REVISION Left 05/10/2018   Procedure: REVISION LEFT TRANSMETATARSAL AMPUTATION;  Surgeon: [name], MD;  Location: [place];  Service: Orthopedics;  Laterality: Left;    Social History:  Social History   Socioeconomic History   Marital status: Married    Spouse name: Not on file   Number of children: Not on file   Years of education: Not on file   Highest education level: Bachelor's degree (e.g., BA, AB, BS)  Occupational History   Not on file  Social Needs   Financial resource strain: Not on file   Food insecurity    Worry: Not on file    Inability: Not on file   Transportation needs    Medical: Not on file    Non-medical: Not on file  Tobacco Use   Smoking status: Former Smoker    Packs/day: 1.00    Types: Cigarettes    Start date: 1963    Quit date: 1985    Years since quitting: 35.5   Smokeless tobacco: Never Used  Substance and Sexual Activity   Alcohol  use: Yes    Alcohol/week: 4.0 standard drinks    Types: 4 Cans of beer per week    Comment: social    Drug use: Never   Sexual activity: Not on file  Lifestyle   Physical activity    Days per week: Not on file    Minutes per session: Not on file   Stress: Not on file  Relationships   Social connections    Talks on phone: Not on file    Gets together: Not on file    Attends religious service: Not on file    Active member of club or organization: Not on file    Attends meetings of clubs or organizations: Not on file    Relationship status: Not on file   Intimate partner violence    Fear of current or ex partner: Not on file    Emotionally abused: Not on file    Physically abused: Not on file    Forced sexual activity: Not on file  Other Topics Concern   Not on file  Social History Narrative   Lives at home with his wife and two cats. Married for almost 45 years   Right handed   Caffeine: 1 cup every morning    Family History:  Family  History  Problem Relation Age of Onset   Hypertension Father     Medications:   Current Outpatient Medications on File Prior to Visit  Medication Sig Dispense Refill   apixaban (ELIQUIS) 5 MG TABS tablet Take 1 tablet (5 mg total) by mouth 2 (two) times daily. 60 tablet 1   diltiazem (CARDIZEM CD) 120 MG 24 hr capsule Take 1 capsule (120 mg total) by mouth daily. 90 capsule 2   FLUoxetine (PROZAC) 20 MG capsule Take 20 mg by mouth daily.     furosemide (LASIX) 20 MG tablet TAKE 1 TABLET BY MOUTH DAILY. YOU MAY TAKE AN EXTRA TABLET FOR INCREASED SWELLING OR WEIGHT GAIN 180 tablet 1   insulin glargine (LANTUS) 100 UNIT/ML injection Inject 0.16 mLs (16 Units total) into the skin 2 (two) times daily for 30 days. (Patient taking differently: Inject 28 Units into the skin 2 (two) times daily. ) 9.6 mL 0   mesalamine (APRISO) 0.375 g 24 hr capsule Take 750 mg by mouth 2 (two) times daily before a meal.      metoprolol (TOPROL-XL) 200 MG 24 hr tablet Take 1 tablet (200 mg total) by mouth daily. Take with or immediately following a meal. (Patient taking differently: Take 100 mg by mouth daily. Take with or immediately following a meal.) 90 tablet 2   Multiple Vitamin (MULTIVITAMIN WITH MINERALS) TABS tablet Take 1 tablet by mouth daily.     rosuvastatin (CRESTOR) 10 MG tablet Take 5 mg by mouth daily.     tamsulosin (FLOMAX) 0.4 MG CAPS capsule Take 1 capsule (0.4 mg total) by mouth 2 times daily at 12 noon and 4 pm. (Patient taking differently: Take 0.4 mg by mouth 2 (two) times daily. ) 60 capsule 0   trimethoprim (TRIMPEX) 100 MG tablet Take 100 mg by mouth at bedtime.   11   No current facility-administered medications on file prior to visit.     Allergies:   No Active Allergies   Physical Exam  There were no vitals filed for this visit. There is no height or weight on file to calculate BMI. No exam data present  General: well developed, well nourished, pleasant elderly  Caucasian male, seated,  in no evident distress Head: head normocephalic and atraumatic.   Neck: supple with no carotid or supraclavicular bruits Cardiovascular: regular rate and rhythm, no murmurs Musculoskeletal: Left metatarsal amputation Skin:  no rash/petichiae Vascular:  Normal pulses all extremities  Neurologic Exam Mental Status: Awake and fully alert. Oriented to place and time. Recent and remote memory intact. Attention span, concentration and fund of knowledge appropriate. Mood and affect appropriate.  Cranial Nerves: Fundoscopic exam reveals sharp disc margins. Pupils equal, briskly reactive to light. Extraocular movements full without nystagmus. Visual fields full to confrontation. Hearing intact. Facial sensation intact. Face, tongue, palate moves normally and symmetrically.  Motor: Normal bulk and tone. Normal strength in all tested extremity muscles. Sensory.: intact to touch , pinprick , position and vibratory sensation.  Coordination: Rapid alternating movements normal in all extremities. Finger-to-nose and heel-to-shin performed accurately bilaterally. Gait and Station: Arises from chair without difficulty. Stance is normal. Gait demonstrates normal stride length and balance with assistance of rolling walker. Reflexes: 1+ and symmetric. Toes downgoing.     Diagnostic Data (Labs, Imaging, Testing)   IMAGING Ct Head Code Stroke Wo Contrast 03/16/2018 2051 1. Negative for age. No acute cortically based infarct or intracranial hemorrhage is identified. 2. ASPECTS is 10. 3. Chronic maxillary sinusitis.   Ct Angio Head W Or Wo Contrast Ct Angio Neck W Or Wo Contrast 03/16/2018 2125 1. Negative for large vessel occlusion. 2. Positive for atherosclerosis in the head and neck, and Aortic Atherosclerosis (ICD10-I70.0). 3. 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 No hemodynamically significant carotid or right vertebral stenosis.  4.  Emphysema   Ct Head Wo Contrast 03/17/2018 1751 Negative non-contrast CT HEAD for age.   Mr Brain Wo Contrast 03/18/2018 1450 1. No acute intracranial abnormality. 2. Mild chronic small vessel ischemic disease.   2D Echocardiogram  03/17/2018  - Left ventricle: The cavity size was normal. Systolic function wasnormal. The estimated ejection fraction was in the range of 60% to 65%. Wall motion was normal; there were no regional wallmotion abnormalities. There was no evidence of elevatedventricular filling pressure by Doppler parameters.  - Left atrium: The atrium was moderately dilated. Impressions:   No cardiac source of emboli was indentified      ASSESSMENT: Patricio Popwell is a 78 y.o. year old male here with strokelike episode s/p TPA on 03/16/2018 secondary to new onset AF. Vascular risk factors include HTN, HLD, DM and new onset AF.  Patient continues to well from a stroke standpoint without residual deficits.     PLAN: -Continue Eliquis (apixaban) daily  and Crestor for secondary stroke prevention -F/u with PCP regarding your HLD, HTN and DM management -f/u with cardiologist as scheduled for atrial fibrillation and Eliquis management -Was advised to continue to stay active as tolerated and maintain a healthy diet -continue to monitor BP at home -Maintain strict control of hypertension with blood pressure goal below 130/90, diabetes with hemoglobin A1c goal below 6.5% and cholesterol with LDL cholesterol (bad cholesterol) goal below 70 mg/dL. I also advised the patient to eat a healthy diet with plenty of whole grains, cereals, fruits and vegetables, exercise regularly and maintain ideal body weight.  Follow up in 6 months or call earlier if needed   Greater than 50% of time during this 25 minute visit was spent on counseling,explanation of diagnosis of strokelike symptoms s/p tPA, reviewing risk factor management of HLD, HTN, DM and AF, planning of further  management, discussion  with patient and family and coordination of care    Venancio Poisson, [facility]  [facility] [address] [place], [place] [address]  Phone [phone] Fax [phone]

## 2019-03-28 ENCOUNTER — Telehealth: Payer: Self-pay

## 2019-03-28 NOTE — Telephone Encounter (Signed)
Patient no show for in doxy video visit on 03/26/2019.

## 2019-04-28 ENCOUNTER — Other Ambulatory Visit: Payer: Self-pay | Admitting: Cardiovascular Disease

## 2019-04-30 NOTE — Telephone Encounter (Signed)
78 years old 101.7kg Last OV 11/02/2018 Scr 1.77 on 12/19/2018 Eliquis 5mg  BID sent to pharamcy

## 2019-05-15 DIAGNOSIS — E1169 Type 2 diabetes mellitus with other specified complication: Secondary | ICD-10-CM | POA: Diagnosis not present

## 2019-06-18 ENCOUNTER — Telehealth: Payer: Self-pay

## 2019-06-18 NOTE — Telephone Encounter (Signed)
Pt called and stated that a telemedicine or virtual visit would work out better.

## 2019-06-18 NOTE — Progress Notes (Signed)
[facility] [address] [place]. [place] [address] [phone]        FOLLOW UP NOTE  [name] Date of Birth:  09-25-1941 Medical Record Number:  [id number]   Reason for Referral: stroke follow up  Virtual Visit via Video Note  I connected with Kevin [name] on 06/19/19 at  8:45 AM EDT by a video enabled telemedicine application located at [facility] and verified that I am speaking with the correct person using two identifiers who was located at their own home.   Kevin [name], CMA schedule visit who discussed the limitations of evaluation and management by telemedicine and the availability of in person appointments. The patient expressed understanding and agreed to proceed.    CHIEF COMPLAINT:  Chief Complaint  Patient presents with   Cerebrovascular Accident    f/u -stable    HPI:  Stroke admission 03/16/2018: [name] is a 78 y.o. male with history of HTN and DB who presented with L sided weakness, L facial droop and dysphagia.  CT head reviewed and showed no acute stroke.  Patient did receive TPA without complication.  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  Repeat CT scan 24 hours after TPA negative for acute infarct.  MRI head reviewed was negative for acute infarct but did show small vessel disease.  2D echo showed an EF of 60 to 65% without cardiac source of embolus.  LDL satisfactory at 39 and recommended continuation of current statin.  A1c elevated at 7.6 and recommended tight glycemic control with close PCP follow-up.  HTN stable after medication adjustment and recommended BP goal normotensive.  Patient was previously on aspirin 81 mg PTA and recommended to continue this but throughout hospital stay patient was found to develop new onset atrial fibrillation and recommended management with Eliquis.  Patient was discharged  to [facility] for continuation of therapies.  05/03/2018 visit: Patient is being seen today for hospital follow-up and is accompanied by his wife.  He states he has been doing well with no residual stroke deficits.  He continues to be compliant with Eliquis without side effects of bleeding or bruising.  Continues to take Crestor without side effects myalgias.  Blood pressure today 102/54 and per patient, this is typically his normal as he monitors this at home.  Does check glucose levels at home which have been stable.  Recently diagnosed with CHF and was started on Lasix which has improved his shortness of breath and his weight has been consistent.  He will be undergoing cardioversion on Friday.  He does have a follow-up with Kevin [name] on 05/04/2018 for follow-up of left metatarsal amputation.  He states this is been doing well, continues to wear the boot and has been using rolling walker for ambulation.  Denies new or worsening stroke/TIA symptoms.  08/21/2018 visit: Patient is being seen today for 76-monthfollow-up visit.  He continues to do well without recurring symptoms or residual deficits.  He continues to take Eliquis without side effects of bleeding or bruising.  Continues to take Crestor without side effects myalgias.  Blood pressure today satisfactory 132/64. Monitors glucose levels at home which have been satisfactory.  He continues to stay active with walking and denies any complications. He plans on returning to fitness center soon but is waiting to be cleared by all providers. Occasional shortness of breath with CHF especially with increased exertion  but denies debilitating and is aware of slowing down activity. No further concerns. Denies new or worsening stroke/TIA symptoms.   Update 06/18/2019: [name] is being seen today for stroke follow-up through telemedicine via MyChart per patient request.  He has been doing well from a stroke standpoint without residual deficits or reoccurring/new stroke/TIA  symptoms.  He continues on Eliquis without bleeding or bruising.  Continues on Crestor without myalgias. Lipid panel obtained by PCP 05/15/19 which was satisfactory per patient.  Blood pressure monitored at home which has been stable ranging 120s/70s.  Glucose levels have been stable.  He continues to follow with PCP for HTN, HLD and DM management.  No concerns at this time.     ROS:   14 system review of systems performed and negative with exception of no complaints   PMH:  Past Medical History:  Diagnosis Date   A-fib ([place]) 03/20/2018   Acute blood loss anemia    Benign essential HTN    Benign prostatic hyperplasia with urinary retention    Cerebrovascular accident (CVA) ([place])    CKD (chronic kidney disease), stage III ([place])    Diabetes ([place]) 03/20/2018   Diabetes mellitus type 2 in nonobese [facility])    Essential hypertension 03/20/2018   Former tobacco use    H/O ulcerative colitis 1993   Hypertension    Hypokalemia    Leukocytosis    New onset atrial fibrillation ([place])    Osteomyelitis ([place])    a. 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   PAF (paroxysmal atrial fibrillation) ([place])    a. dx 03/2018 in setting of stroke, osteomyelitis.   Sepsis ([place]) 03/20/2018   Stage 3 chronic kidney disease ([place])    pt/family unaware   Status post transmetatarsal amputation of left foot ([place])    Stroke ([place]) 03/2018   Stroke-like episode ([place]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 03/16/2018   Tachycardia 03/20/2018   Wrist fracture    Left    PSH:  Past Surgical History:  Procedure Laterality Date   AMPUTATION Left 03/24/2018   Procedure: Transmetatarsal Amputation Left Foot;  Surgeon: Kevin Minion, MD;  Location: [place];  Service: Orthopedics;  Laterality: Left;   CARDIOVERSION N/A 05/05/2018   Procedure: CARDIOVERSION;  Surgeon: Kevin Latch, MD;  Location: [facility];  Service: Cardiovascular;  Laterality: N/A;   COLONOSCOPY  12/2017   benign, remission from ulcerative colitis in 90s     I&D EXTREMITY Left 03/17/2018   Procedure: IRRIGATION AND DEBRIDEMENT FOOT;  Surgeon: [name], MD;  Location: [place];  Service: Orthopedics;  Laterality: Left;   STUMP REVISION Left 05/10/2018   Procedure: REVISION LEFT TRANSMETATARSAL AMPUTATION;  Surgeon: Kevin Minion, MD;  Location: [place];  Service: Orthopedics;  Laterality: Left;    Social History:  Social History   Socioeconomic History   Marital status: Married    Spouse name: Not on file   Number of children: Not on file   Years of education: Not on file   Highest education level: Bachelor's degree (e.g., BA, AB, BS)  Occupational History   Not on file  Social Needs   Financial resource strain: Not on file   Food insecurity    Worry: Not on file    Inability: Not on file   Transportation needs    Medical: Not on file    Non-medical: Not on file  Tobacco Use   Smoking status: Former Smoker    Packs/day: 1.00    Types: Cigarettes    Start date:  88    Quit date: 51    Years since quitting: 35.7   Smokeless tobacco: Never Used  Substance and Sexual Activity   Alcohol use: Yes    Alcohol/week: 4.0 standard drinks    Types: 4 Cans of beer per week    Comment: social    Drug use: Never   Sexual activity: Not on file  Lifestyle   Physical activity    Days per week: Not on file    Minutes per session: Not on file   Stress: Not on file  Relationships   Social connections    Talks on phone: Not on file    Gets together: Not on file    Attends religious service: Not on file    Active member of club or organization: Not on file    Attends meetings of clubs or organizations: Not on file    Relationship status: Not on file   Intimate partner violence    Fear of current or ex partner: Not on file    Emotionally abused: Not on file    Physically abused: Not on file    Forced sexual activity: Not on file  Other Topics Concern   Not on file  Social History Narrative   Lives at  home with his wife and two cats. Married for almost 45 years   Right handed   Caffeine: 1 cup every morning    Family History:  Family History  Problem Relation Age of Onset   Hypertension Father     Medications:   Current Outpatient Medications on File Prior to Visit  Medication Sig Dispense Refill   diltiazem (CARDIZEM CD) 120 MG 24 hr capsule Take 1 capsule (120 mg total) by mouth daily. 90 capsule 2   ELIQUIS 5 MG TABS tablet TAKE 1 TABLET TWICE A DAY 180 tablet 1   FLUoxetine (PROZAC) 20 MG capsule Take 20 mg by mouth daily.     furosemide (LASIX) 20 MG tablet TAKE 1 TABLET BY MOUTH DAILY. YOU MAY TAKE AN EXTRA TABLET FOR INCREASED SWELLING OR WEIGHT GAIN 180 tablet 1   insulin glargine (LANTUS) 100 UNIT/ML injection Inject 0.16 mLs (16 Units total) into the skin 2 (two) times daily for 30 days. (Patient taking differently: Inject 28 Units into the skin 2 (two) times daily. ) 9.6 mL 0   mesalamine (APRISO) 0.375 g 24 hr capsule Take 750 mg by mouth 2 (two) times daily before a meal.      metoprolol (TOPROL-XL) 200 MG 24 hr tablet Take 1 tablet (200 mg total) by mouth daily. Take with or immediately following a meal. (Patient taking differently: Take 100 mg by mouth daily. Take with or immediately following a meal.) 90 tablet 2   Multiple Vitamin (MULTIVITAMIN WITH MINERALS) TABS tablet Take 1 tablet by mouth daily.     rosuvastatin (CRESTOR) 10 MG tablet Take 5 mg by mouth daily.     tamsulosin (FLOMAX) 0.4 MG CAPS capsule Take 1 capsule (0.4 mg total) by mouth 2 times daily at 12 noon and 4 pm. (Patient taking differently: Take 0.4 mg by mouth 2 (two) times daily. ) 60 capsule 0   trimethoprim (TRIMPEX) 100 MG tablet Take 100 mg by mouth at bedtime.   11   No current facility-administered medications on file prior to visit.     Allergies:   No Active Allergies   Physical Exam *Limited exam due to visit type*  General: well developed, well nourished, pleasant  elderly  Caucasian male, seated, in no evident distress Head: head normocephalic and atraumatic.    Neurologic Exam Mental Status: Awake and fully alert. Oriented to place and time. Recent and remote memory intact. Attention span, concentration and fund of knowledge appropriate. Mood and affect appropriate.  Cranial Nerves: Extraocular movements full without nystagmus. Hearing intact to voice. Face, tongue, palate moves normally and symmetrically.  Motor: No evidence of weakness per drift assessment. Sensory.:  Subjectively intact to light touch Coordination: Rapid alternating movements normal in all extremities. Finger-to-nose and heel-to-shin performed accurately bilaterally. Gait and Station: Arises from chair without difficulty. Stance is normal. Gait demonstrates normal stride length and balance  Reflexes: UTA    Diagnostic Data (Labs, Imaging, Testing)   IMAGING Ct Head Code Stroke Wo Contrast 03/16/2018 2051 1. Negative for age. No acute cortically based infarct or intracranial hemorrhage is identified. 2. ASPECTS is 10. 3. Chronic maxillary sinusitis.   Ct Angio Head W Or Wo Contrast Ct Angio Neck W Or Wo Contrast 03/16/2018 2125 1. Negative for large vessel occlusion. 2. Positive for atherosclerosis in the head and neck, and Aortic Atherosclerosis (ICD10-I70.0). 3. 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 No hemodynamically significant carotid or right vertebral stenosis. 4.  Emphysema   Ct Head Wo Contrast 03/17/2018 1751 Negative non-contrast CT HEAD for age.   Mr Brain Wo Contrast 03/18/2018 1450 1. No acute intracranial abnormality. 2. Mild chronic small vessel ischemic disease.   2D Echocardiogram  03/17/2018  - Left ventricle: The cavity size was normal. Systolic function wasnormal. The estimated ejection fraction was in the range of 60% to 65%. Wall motion was normal; there were no regional wallmotion  abnormalities. There was no evidence of elevatedventricular filling pressure by Doppler parameters.  - Left atrium: The atrium was moderately dilated. Impressions:   No cardiac source of emboli was indentified      ASSESSMENT: Wynter Grave is a 78 y.o. year old male here with strokelike episode s/p TPA on 03/16/2018 secondary to new onset AF. Vascular risk factors include HTN, HLD, DM and new onset AF.  Being seen today via virtual visit via my chart per patient request for follow-up which she has been stable from a neurological standpoint without reoccurring or new stroke/TIA symptoms    PLAN: -Continue Eliquis (apixaban) daily  and Crestor for secondary stroke prevention -F/u with PCP regarding your HLD, HTN and DM management -f/u with cardiologist as scheduled for atrial fibrillation and Eliquis management -Was advised to continue to stay active as tolerated and maintain a healthy diet -continue to monitor BP at home -Maintain strict control of hypertension with blood pressure goal below 130/90, diabetes with hemoglobin A1c goal below 6.5% and cholesterol with LDL cholesterol (bad cholesterol) goal below 70 mg/dL. I also advised the patient to eat a healthy diet with plenty of whole grains, cereals, fruits and vegetables, exercise regularly and maintain ideal body weight.  Stable from stroke standpoint recommend follow-up as needed  Greater than 50% of time during this 25 minute non-face-to-face visit was spent on counseling,explanation of diagnosis of strokelike symptoms s/p tPA, reviewing risk factor management of HLD, HTN, DM and AF, planning of further management, discussion with patient and family and coordination of care    Frann Rider, [facility]  [facility] [address] [place], [place] [address]  Phone [phone] Fax [phone]

## 2019-06-19 ENCOUNTER — Encounter: Payer: Self-pay | Admitting: Adult Health

## 2019-06-19 ENCOUNTER — Telehealth (INDEPENDENT_AMBULATORY_CARE_PROVIDER_SITE_OTHER): Payer: Medicare Other | Admitting: Adult Health

## 2019-06-19 DIAGNOSIS — I480 Paroxysmal atrial fibrillation: Secondary | ICD-10-CM | POA: Diagnosis not present

## 2019-06-19 DIAGNOSIS — I10 Essential (primary) hypertension: Secondary | ICD-10-CM

## 2019-06-19 DIAGNOSIS — E119 Type 2 diabetes mellitus without complications: Secondary | ICD-10-CM

## 2019-06-19 DIAGNOSIS — E785 Hyperlipidemia, unspecified: Secondary | ICD-10-CM

## 2019-06-19 DIAGNOSIS — R2990 Unspecified symptoms and signs involving the nervous system: Secondary | ICD-10-CM

## 2019-06-19 DIAGNOSIS — I639 Cerebral infarction, unspecified: Secondary | ICD-10-CM

## 2019-06-19 NOTE — Progress Notes (Signed)
I agree with the above plan 

## 2019-07-19 ENCOUNTER — Ambulatory Visit (INDEPENDENT_AMBULATORY_CARE_PROVIDER_SITE_OTHER): Payer: Medicare Other | Admitting: Cardiovascular Disease

## 2019-07-19 ENCOUNTER — Encounter: Payer: Self-pay | Admitting: Cardiovascular Disease

## 2019-07-19 ENCOUNTER — Other Ambulatory Visit: Payer: Self-pay

## 2019-07-19 VITALS — BP 124/72 | HR 63 | Ht 73.0 in | Wt 242.1 lb

## 2019-07-19 DIAGNOSIS — R2990 Unspecified symptoms and signs involving the nervous system: Secondary | ICD-10-CM | POA: Diagnosis not present

## 2019-07-19 DIAGNOSIS — I5032 Chronic diastolic (congestive) heart failure: Secondary | ICD-10-CM | POA: Diagnosis not present

## 2019-07-19 DIAGNOSIS — I480 Paroxysmal atrial fibrillation: Secondary | ICD-10-CM

## 2019-07-19 DIAGNOSIS — I10 Essential (primary) hypertension: Secondary | ICD-10-CM

## 2019-07-19 NOTE — Patient Instructions (Signed)

## 2019-07-19 NOTE — Progress Notes (Signed)
Chief Complaint  Patient presents with  . Follow-up    atrial fibrillation   History of Present Illness: 78 yo Kevin [name] with history of HTN, DM, CVA, persistent atrial fibrillation on Eliquis, CKD stage 3, chronic diastolic CHF and RBBB here today for cardiac follow up. He was admitted to [facility] June 2019 with a stroke and was given TPA. He was found to have atrial fibrillation during the hospitalization. He was septic from wound of his left foot and required transmetatarsal amputation. He was started on Eliquis in June 2019 and was cardioverted to sinus in August 2019. Echo 03/17/18 with LVEF=60-65%.   He is here today for follow up. The patient denies any chest pain, palpitations, lower extremity edema, orthopnea, PND, dizziness, near syncope or syncope. He thinks he had Covid 19 in February. He still has mild dyspnea with exertion but quickly resolves. No fever, chills or cough.   Primary Care Physician: [name], MD  Past Medical History:  Diagnosis Date  . A-fib ([place]) 03/20/2018  . Acute blood loss anemia   . Benign essential HTN   . Benign prostatic hyperplasia with urinary retention   . Cerebrovascular accident (CVA) ([place])   . CKD (chronic kidney disease), stage III   . Diabetes ([place]) 03/20/2018  . Diabetes mellitus type 2 in nonobese ([facility])   . Essential hypertension 03/20/2018  . Former tobacco use   . H/O ulcerative colitis 1993  . Hypertension   . Hypokalemia   . Leukocytosis   . New onset atrial fibrillation ([place])   . Osteomyelitis ([place])    a. 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  [name] (paroxysmal atrial fibrillation) ([place])    a. dx 03/2018 in setting of stroke, osteomyelitis.  . Sepsis ([place]) 03/20/2018  . Stage 3 chronic kidney disease    pt/family unaware  . Status post transmetatarsal amputation of left foot ([place])   . Stroke ([place]) 03/2018  . Stroke-like episode ([place]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 03/16/2018  . Tachycardia 03/20/2018  . Wrist fracture    Left    Past Surgical  History:  Procedure Laterality Date  . AMPUTATION Left 03/24/2018   Procedure: Transmetatarsal Amputation Left Foot;  Surgeon: [name], MD;  Location: [place];  Service: Orthopedics;  Laterality: Left;  . CARDIOVERSION N/A 05/05/2018   Procedure: CARDIOVERSION;  Surgeon: [name], MD;  Location[facility] ENDOSCOPY;  Service: Cardiovascular;  Laterality: N/A;  . COLONOSCOPY  12/2017   benign, remission from ulcerative colitis in 90s  . I&D EXTREMITY Left 03/17/2018   Procedure: IRRIGATION AND DEBRIDEMENT FOOT;  Surgeon: [name], MD;  Location: [place];  Service: Orthopedics;  Laterality: Left;  . STUMP REVISION Left 05/10/2018   Procedure: REVISION LEFT TRANSMETATARSAL AMPUTATION;  Surgeon: [name], MD;  Location: [place];  Service: Orthopedics;  Laterality: Left;    Current Outpatient Medications  Medication Sig Dispense Refill  . diltiazem (CARDIZEM CD) 120 MG 24 hr capsule Take 1 capsule (120 mg total) by mouth daily. 90 capsule 2  . ELIQUIS 5 MG TABS tablet TAKE 1 TABLET TWICE A DAY 180 tablet 1  . FLUoxetine (PROZAC) 20 MG capsule Take 20 mg by mouth daily.    . furosemide (LASIX) 20 MG tablet TAKE 1 TABLET BY MOUTH DAILY. YOU MAY TAKE AN EXTRA TABLET FOR INCREASED SWELLING OR WEIGHT GAIN 180 tablet 1  . insulin glargine (LANTUS) 100 UNIT/ML injection Inject 0.16 mLs (16 Units total) into the skin 2 (two) times daily for 30 days.  9.6 mL 0  . mesalamine (APRISO) 0.375 g 24 hr capsule Take 750 mg by mouth 2 (two) times daily before a meal.     . metoprolol succinate (TOPROL-XL) 100 MG 24 hr tablet Take 100 mg by mouth daily. Take with or immediately following a meal.    . Multiple Vitamin (MULTIVITAMIN WITH MINERALS) TABS tablet Take 1 tablet by mouth daily.    . rosuvastatin (CRESTOR) 10 MG tablet Take 5 mg by mouth daily.    . tamsulosin (FLOMAX) 0.4 MG CAPS capsule Take 1 capsule (0.4 mg total) by mouth 2 times daily at 12 noon and 4 pm. 60 capsule 0  . trimethoprim  (TRIMPEX) 100 MG tablet Take 100 mg by mouth at bedtime.   11   No current facility-administered medications for this visit.     No Active Allergies  Social History   Socioeconomic History  . Marital status: Married    Spouse name: Not on file  . Number of children: Not on file  . Years of education: Not on file  . Highest education level: Bachelor's degree (e.g., BA, AB, BS)  Occupational History  . Not on file  Social Needs  . Financial resource strain: Not on file  . Food insecurity    Worry: Not on file    Inability: Not on file  . Transportation needs    Medical: Not on file    Non-medical: Not on file  Tobacco Use  . Smoking status: Former Smoker    Packs/day: 1.00    Types: Cigarettes    Start date: 1963    Quit date: 1985    Years since quitting: 35.8  . Smokeless tobacco: Never Used  Substance and Sexual Activity  . Alcohol use: Yes    Alcohol/week: 4.0 standard drinks    Types: 4 Cans of beer per week    Comment: social   . Drug use: Never  . Sexual activity: Not on file  Lifestyle  . Physical activity    Days per week: Not on file    Minutes per session: Not on file  . Stress: Not on file  Relationships  . Social Herbalist on phone: Not on file    Gets together: Not on file    Attends religious service: Not on file    Active member of club or organization: Not on file    Attends meetings of clubs or organizations: Not on file    Relationship status: Not on file  . Intimate partner violence    Fear of current or ex partner: Not on file    Emotionally abused: Not on file    Physically abused: Not on file    Forced sexual activity: Not on file  Other Topics Concern  . Not on file  Social History Narrative   Lives at home with his wife and two cats. Married for almost 45 years   Right handed   Caffeine: 1 cup every morning    Family History  Problem Relation Age of Onset  . Hypertension Father     Review of Systems:  As stated  in the HPI and otherwise negative.   BP 124/72   Pulse 63   Ht 6\' 1"  (1.854 m)   Wt 242 lb 1.9 oz (109.8 kg)   SpO2 95%   BMI 31.94 kg/m   Physical Examination: General: Well developed, well nourished, NAD  HEENT: OP clear, mucus membranes moist  SKIN: warm, dry. No  rashes. Neuro: No focal deficits  Musculoskeletal: Muscle strength 5/5 all ext  Psychiatric: Mood and affect normal  Neck: No JVD, no carotid bruits, no thyromegaly, no lymphadenopathy.  Lungs:Clear bilaterally, no wheezes, rhonci, crackles Cardiovascular: Regular rate and rhythm. No murmurs, gallops or rubs. Abdomen:Soft. Bowel sounds present. Non-tender.  Extremities: No lower extremity edema. Pulses are 2 + in the bilateral DP/PT.  Echo 03/17/18: - Left ventricle: The cavity size was normal. Systolic function was   normal. The estimated ejection fraction was in the range of 60%   to 65%. Wall motion was normal; there were no regional wall   motion abnormalities. There was no evidence of elevated   ventricular filling pressure by Doppler parameters. - Left atrium: The atrium was moderately dilated.  Impressions:  - No cardiac source of emboli was indentified.  EKG:  EKG is ordered today. The ekg ordered today demonstrates Sinus, rate 63 bpm.   Recent Labs: 12/16/2018: TSH 3.488 12/19/2018: BUN 18; Creatinine, Ser 1.Kevin; Hemoglobin 11.4; Magnesium 1.7; Platelets 255; Potassium 4.2; Sodium 139   Lipid Panel    Component Value Date/Time   CHOL 75 03/17/2018 0339   TRIG 114 03/17/2018 0339   HDL 13 (L) 03/17/2018 0339   CHOLHDL 5.8 03/17/2018 0339   VLDL 23 03/17/2018 0339   LDLCALC 39 03/17/2018 0339     Wt Readings from Last 3 Encounters:  07/19/19 242 lb 1.9 oz (109.8 kg)  12/19/18 224 lb 3.3 oz (101.7 kg)  11/02/18 229 lb 12.8 oz (104.2 kg)     Other studies Reviewed: Additional studies/ records that were reviewed today include: . Review of the above records demonstrates:    Assessment and  Plan:   1. Atrial fibrillation, paroxysmal: Sinus today. CHADS VASC score of 5. Will continue Cardizem, Toprol and Eliquis.    2. HTN: BP is controlled.   3. Chronic diastolic CHF: Weight is up 18 lbs but no ankle edema. No evidence of volume overload. Continue Lasix.    Current medicines are reviewed at length with the patient today.  The patient does not have concerns regarding medicines.  The following changes have been made:  no change  Labs/ tests ordered today include:  No orders of the defined types were placed in this encounter.    Disposition:   FU with me  in 12 months   Signed, Lauree Chandler, MD 07/19/2019 3:54 PM    [facility] [place], [place], [place]  [address] Phone: [phone]; Fax: [phone]

## 2019-07-20 NOTE — Addendum Note (Signed)
Addended by: Carylon Perches on: 07/20/2019 10:35 AM   Modules accepted: Orders

## 2019-07-27 DIAGNOSIS — N130 Hydronephrosis with ureteropelvic junction obstruction: Secondary | ICD-10-CM | POA: Diagnosis not present

## 2019-07-27 DIAGNOSIS — R3914 Feeling of incomplete bladder emptying: Secondary | ICD-10-CM | POA: Diagnosis not present

## 2019-07-31 IMAGING — US RENAL/URINARY TRACT ULTRASOUND
1 series · 14 of 25 positions shown · non-contrast
Comparison: Renal ultrasound 06/14/2018.

CLINICAL DATA: 77-year-old male with history of acute kidney injury
with elevated BUN and creatinine. History of hypertension. Diabetes.
Chronic kidney disease.

EXAM:
RENAL / URINARY TRACT ULTRASOUND COMPLETE

[Series 1: renal/urinary tract ultrasound · 41 acquisitions, 14 frames shown]
[im 1/41]
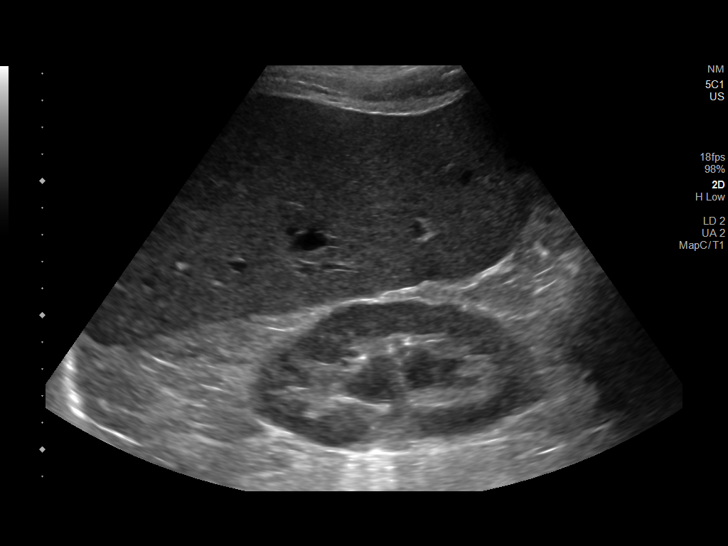
[im 4/41]
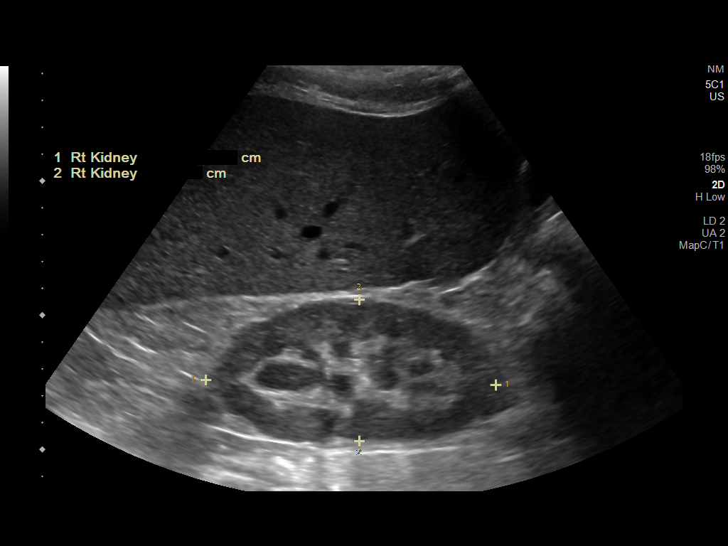
[im 7/41]
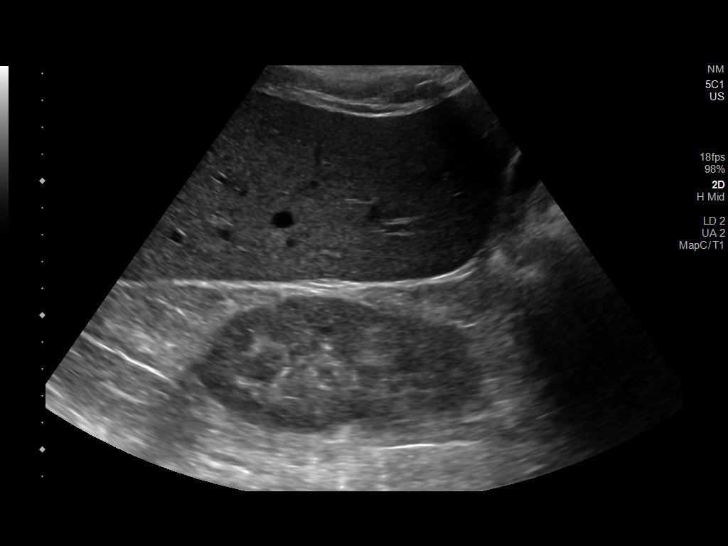
[im 11/41]
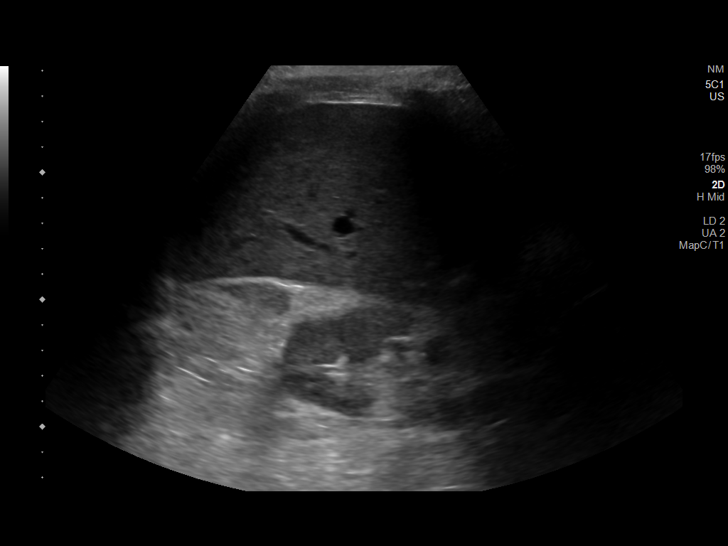
[im 14/41]
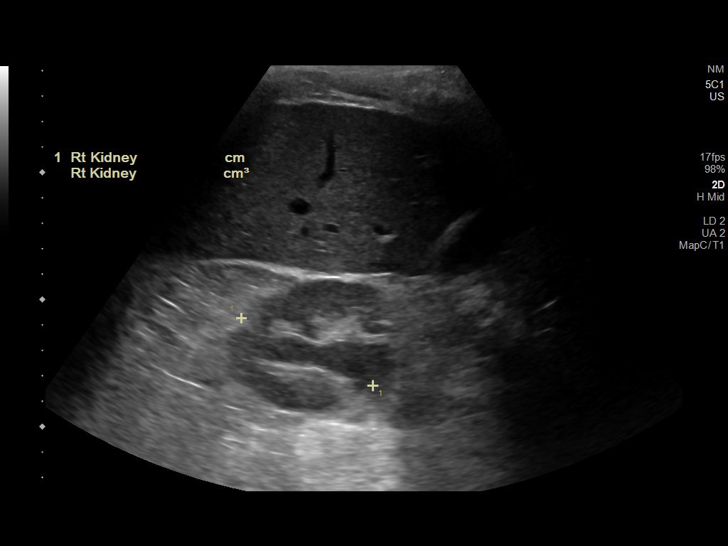
[im 16/41]
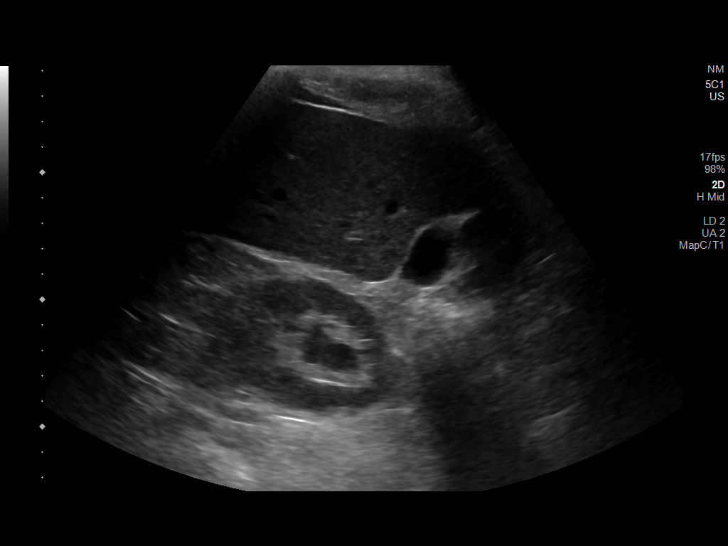
[im 19/41]
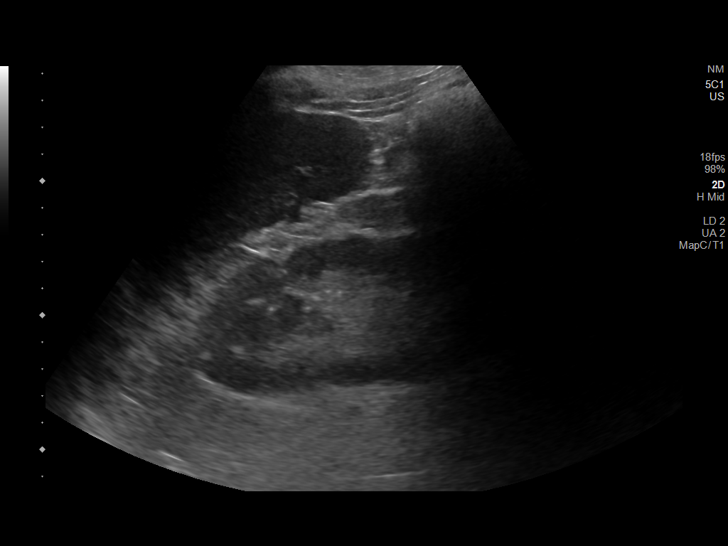
[im 22/41]
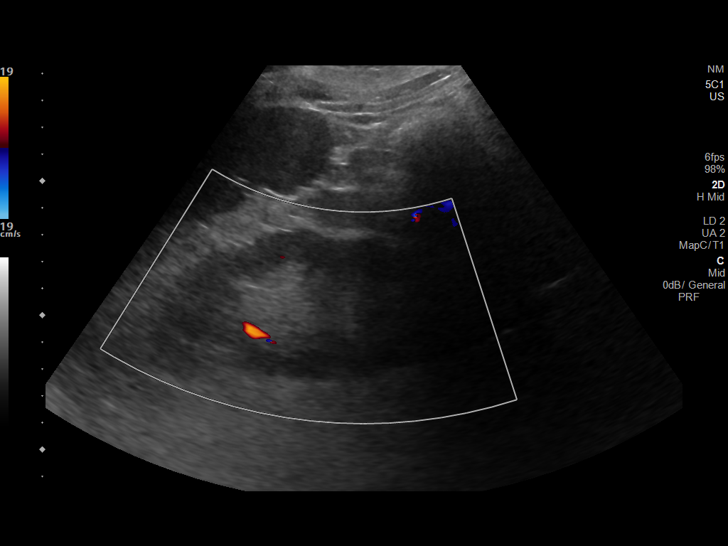
[im 26/41]
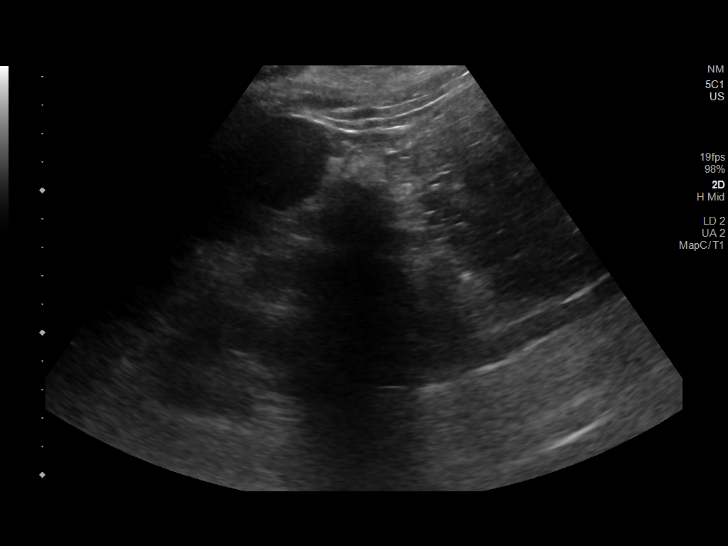
[im 27/41]
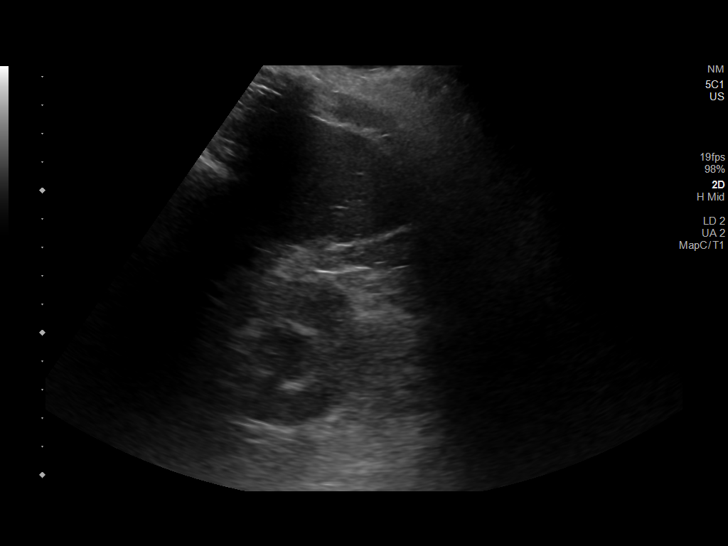
[im 31/41]
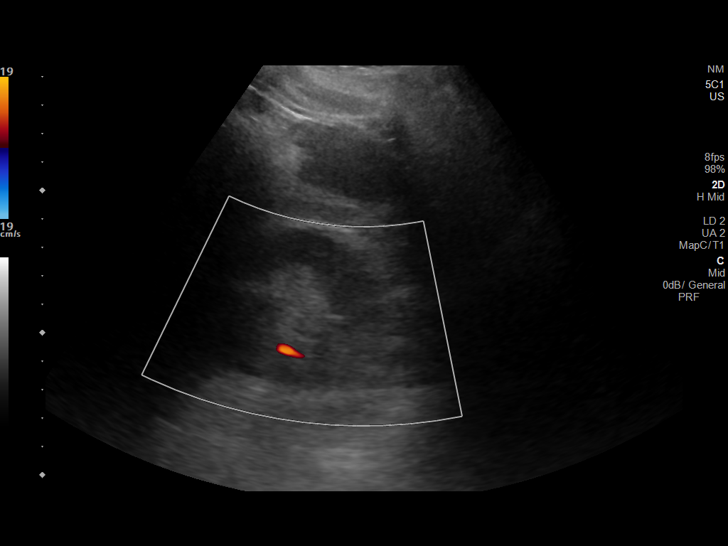
[im 34/41]
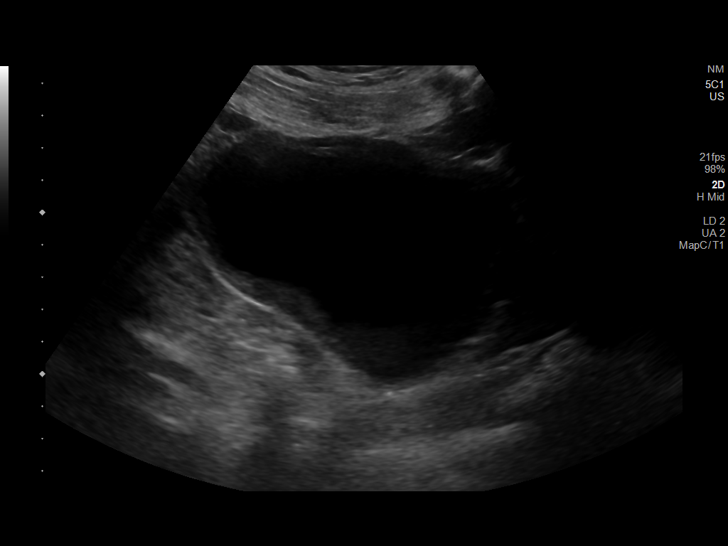
[im 37/41]
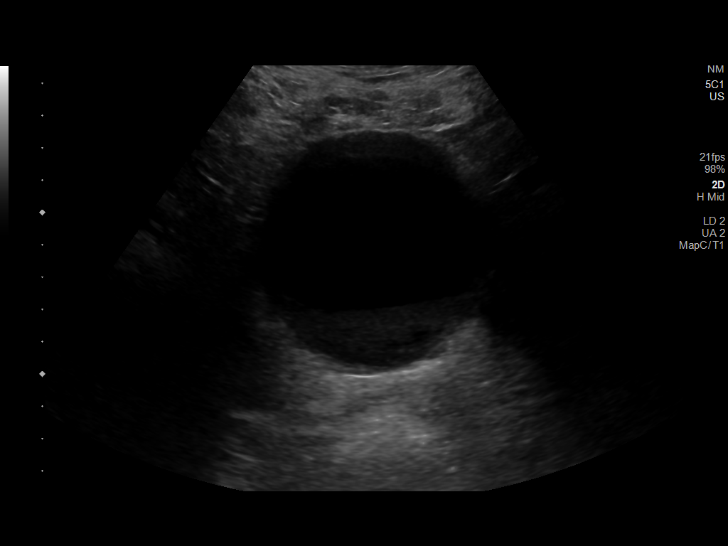
[im 41/41]
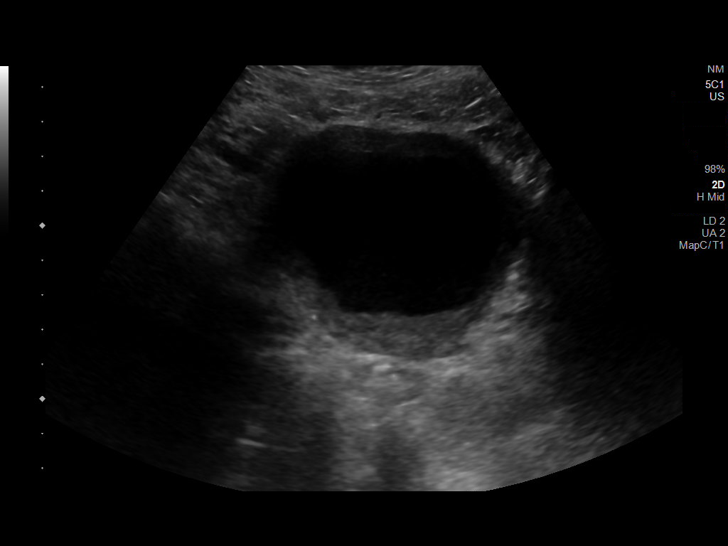

[14 of 25 positions shown; findings below may reference images not displayed]

FINDINGS: Right Kidney:

Renal measurements: 10.8 x 5.3 x 5.8 cm = volume: 174 mL .
Echogenicity mildly increased. No mass. Mild hydronephrosis.

Left Kidney:

Renal measurements: 10.1 x 5.5 x 5.9 cm = volume: 170 mL.
Echogenicity mildly increased. Limits. No mass. Mild hydronephrosis.

Bladder:

Urinary bladder is partially decompressed. There is some dependent
echogenic material which does not shadow in the urinary bladder,
concerning for internal debris.
IMPRESSION: 1. Increased echogenicity in the kidneys bilaterally, suggesting
medical renal disease.
2. Mild bilateral hydronephrosis, similar to prior CT 11/01/2018.
[DATE]. Echogenic nonshadowing material in the lumen of the urinary
bladder, concerning for debris.
4. Urinary bladder wall appears mildly thickened, this could be in
part related to partial decompression, however, this is similar to
recent noncontrast CT examination.

## 2019-08-19 ENCOUNTER — Other Ambulatory Visit: Payer: Self-pay

## 2019-08-19 ENCOUNTER — Emergency Department (INDEPENDENT_AMBULATORY_CARE_PROVIDER_SITE_OTHER): Admission: EM | Admit: 2019-08-19 | Discharge: 2019-08-19 | Payer: Medicare Other | Source: Home / Self Care

## 2019-08-19 ENCOUNTER — Inpatient Hospital Stay (HOSPITAL_COMMUNITY)
Admission: EM | Admit: 2019-08-19 | Discharge: 2019-08-24 | DRG: 177 | Disposition: A | Discharge status: Home / Self Care | Payer: Medicare Other | Source: Ambulatory Visit | Attending: Internal Medicine | Admitting: Internal Medicine

## 2019-08-19 ENCOUNTER — Encounter: Payer: Self-pay | Admitting: Emergency Medicine

## 2019-08-19 ENCOUNTER — Emergency Department (HOSPITAL_COMMUNITY): Payer: Medicare Other

## 2019-08-19 ENCOUNTER — Encounter (HOSPITAL_COMMUNITY): Payer: Self-pay | Admitting: *Deleted

## 2019-08-19 DIAGNOSIS — Z87891 Personal history of nicotine dependence: Secondary | ICD-10-CM

## 2019-08-19 DIAGNOSIS — J9610 Chronic respiratory failure, unspecified whether with hypoxia or hypercapnia: Secondary | ICD-10-CM | POA: Diagnosis present

## 2019-08-19 DIAGNOSIS — R0902 Hypoxemia: Secondary | ICD-10-CM

## 2019-08-19 DIAGNOSIS — I130 Hypertensive heart and chronic kidney disease with heart failure and stage 1 through stage 4 chronic kidney disease, or unspecified chronic kidney disease: Secondary | ICD-10-CM | POA: Diagnosis present

## 2019-08-19 DIAGNOSIS — I639 Cerebral infarction, unspecified: Secondary | ICD-10-CM | POA: Diagnosis present

## 2019-08-19 DIAGNOSIS — Z8619 Personal history of other infectious and parasitic diseases: Secondary | ICD-10-CM

## 2019-08-19 DIAGNOSIS — E119 Type 2 diabetes mellitus without complications: Secondary | ICD-10-CM | POA: Diagnosis present

## 2019-08-19 DIAGNOSIS — Z8719 Personal history of other diseases of the digestive system: Secondary | ICD-10-CM

## 2019-08-19 DIAGNOSIS — J9601 Acute respiratory failure with hypoxia: Secondary | ICD-10-CM | POA: Diagnosis not present

## 2019-08-19 DIAGNOSIS — R739 Hyperglycemia, unspecified: Secondary | ICD-10-CM

## 2019-08-19 DIAGNOSIS — R509 Fever, unspecified: Secondary | ICD-10-CM

## 2019-08-19 DIAGNOSIS — K5190 Ulcerative colitis, unspecified, without complications: Secondary | ICD-10-CM | POA: Diagnosis present

## 2019-08-19 DIAGNOSIS — N1832 Chronic kidney disease, stage 3b: Secondary | ICD-10-CM | POA: Diagnosis not present

## 2019-08-19 DIAGNOSIS — Y92230 Patient room in hospital as the place of occurrence of the external cause: Secondary | ICD-10-CM | POA: Diagnosis not present

## 2019-08-19 DIAGNOSIS — J439 Emphysema, unspecified: Secondary | ICD-10-CM | POA: Diagnosis present

## 2019-08-19 DIAGNOSIS — R0602 Shortness of breath: Secondary | ICD-10-CM | POA: Diagnosis not present

## 2019-08-19 DIAGNOSIS — E1122 Type 2 diabetes mellitus with diabetic chronic kidney disease: Secondary | ICD-10-CM | POA: Diagnosis present

## 2019-08-19 DIAGNOSIS — R062 Wheezing: Secondary | ICD-10-CM | POA: Diagnosis not present

## 2019-08-19 DIAGNOSIS — I5032 Chronic diastolic (congestive) heart failure: Secondary | ICD-10-CM | POA: Diagnosis not present

## 2019-08-19 DIAGNOSIS — I480 Paroxysmal atrial fibrillation: Secondary | ICD-10-CM | POA: Diagnosis present

## 2019-08-19 DIAGNOSIS — R911 Solitary pulmonary nodule: Secondary | ICD-10-CM | POA: Diagnosis present

## 2019-08-19 DIAGNOSIS — N3289 Other specified disorders of bladder: Secondary | ICD-10-CM | POA: Diagnosis present

## 2019-08-19 DIAGNOSIS — I5033 Acute on chronic diastolic (congestive) heart failure: Secondary | ICD-10-CM | POA: Diagnosis present

## 2019-08-19 DIAGNOSIS — E1165 Type 2 diabetes mellitus with hyperglycemia: Secondary | ICD-10-CM | POA: Diagnosis not present

## 2019-08-19 DIAGNOSIS — I63411 Cerebral infarction due to embolism of right middle cerebral artery: Secondary | ICD-10-CM | POA: Diagnosis not present

## 2019-08-19 DIAGNOSIS — T380X5A Adverse effect of glucocorticoids and synthetic analogues, initial encounter: Secondary | ICD-10-CM | POA: Diagnosis not present

## 2019-08-19 DIAGNOSIS — I10 Essential (primary) hypertension: Secondary | ICD-10-CM | POA: Diagnosis present

## 2019-08-19 DIAGNOSIS — Z794 Long term (current) use of insulin: Secondary | ICD-10-CM | POA: Diagnosis not present

## 2019-08-19 DIAGNOSIS — Z89432 Acquired absence of left foot: Secondary | ICD-10-CM | POA: Diagnosis not present

## 2019-08-19 DIAGNOSIS — Z8249 Family history of ischemic heart disease and other diseases of the circulatory system: Secondary | ICD-10-CM | POA: Diagnosis not present

## 2019-08-19 DIAGNOSIS — Z79899 Other long term (current) drug therapy: Secondary | ICD-10-CM

## 2019-08-19 DIAGNOSIS — J189 Pneumonia, unspecified organism: Secondary | ICD-10-CM | POA: Diagnosis not present

## 2019-08-19 DIAGNOSIS — N1330 Unspecified hydronephrosis: Secondary | ICD-10-CM | POA: Diagnosis present

## 2019-08-19 DIAGNOSIS — J1282 Pneumonia due to coronavirus disease 2019: Secondary | ICD-10-CM | POA: Diagnosis present

## 2019-08-19 DIAGNOSIS — J948 Other specified pleural conditions: Secondary | ICD-10-CM | POA: Diagnosis present

## 2019-08-19 DIAGNOSIS — E1159 Type 2 diabetes mellitus with other circulatory complications: Secondary | ICD-10-CM | POA: Diagnosis not present

## 2019-08-19 DIAGNOSIS — J1289 Other viral pneumonia: Secondary | ICD-10-CM

## 2019-08-19 DIAGNOSIS — Z8673 Personal history of transient ischemic attack (TIA), and cerebral infarction without residual deficits: Secondary | ICD-10-CM | POA: Diagnosis not present

## 2019-08-19 DIAGNOSIS — U071 COVID-19: Principal | ICD-10-CM | POA: Diagnosis present

## 2019-08-19 DIAGNOSIS — Z7689 Persons encountering health services in other specified circumstances: Secondary | ICD-10-CM | POA: Diagnosis not present

## 2019-08-19 DIAGNOSIS — R05 Cough: Secondary | ICD-10-CM | POA: Diagnosis not present

## 2019-08-19 DIAGNOSIS — Z743 Need for continuous supervision: Secondary | ICD-10-CM | POA: Diagnosis not present

## 2019-08-19 DIAGNOSIS — R531 Weakness: Secondary | ICD-10-CM

## 2019-08-19 DIAGNOSIS — Z7901 Long term (current) use of anticoagulants: Secondary | ICD-10-CM

## 2019-08-19 DIAGNOSIS — I4510 Unspecified right bundle-branch block: Secondary | ICD-10-CM | POA: Diagnosis present

## 2019-08-19 DIAGNOSIS — R197 Diarrhea, unspecified: Secondary | ICD-10-CM

## 2019-08-19 DIAGNOSIS — J9811 Atelectasis: Secondary | ICD-10-CM | POA: Diagnosis not present

## 2019-08-19 DIAGNOSIS — R279 Unspecified lack of coordination: Secondary | ICD-10-CM | POA: Diagnosis not present

## 2019-08-19 DIAGNOSIS — N400 Enlarged prostate without lower urinary tract symptoms: Secondary | ICD-10-CM | POA: Diagnosis present

## 2019-08-19 LAB — COMPREHENSIVE METABOLIC PANEL
ALT: 31 U/L (ref 0–44)
AST: 36 U/L (ref 15–41)
Albumin: 3.6 g/dL (ref 3.5–5.0)
Alkaline Phosphatase: 65 U/L (ref 38–126)
Anion gap: 10 (ref 5–15)
BUN: 34 mg/dL — ABNORMAL HIGH (ref 8–23)
CO2: 22 mmol/L (ref 22–32)
Calcium: 8.8 mg/dL — ABNORMAL LOW (ref 8.9–10.3)
Chloride: 100 mmol/L (ref 98–111)
Creatinine, Ser: 1.79 mg/dL — ABNORMAL HIGH (ref 0.61–1.24)
GFR calc Af Amer: 42 mL/min — ABNORMAL LOW (ref >60)
GFR calc non Af Amer: 36 mL/min — ABNORMAL LOW (ref >60)
Glucose, Bld: 172 mg/dL — ABNORMAL HIGH (ref 70–99)
Potassium: 4.2 mmol/L (ref 3.5–5.1)
Sodium: 132 mmol/L — ABNORMAL LOW (ref 135–145)
Total Bilirubin: 0.6 mg/dL (ref 0.3–1.2)
Total Protein: 8 g/dL (ref 6.5–8.1)

## 2019-08-19 LAB — CBC WITH DIFFERENTIAL/PLATELET
Abs Immature Granulocytes: 0.08 10*3/uL — ABNORMAL HIGH (ref 0.00–0.07)
Basophils Absolute: 0 10*3/uL (ref 0.0–0.1)
Basophils Relative: 0 %
Eosinophils Absolute: 0 10*3/uL (ref 0.0–0.5)
Eosinophils Relative: 0 %
HCT: 45.3 % (ref 39.0–52.0)
Hemoglobin: 15 g/dL (ref 13.0–17.0)
Immature Granulocytes: 1 %
Lymphocytes Relative: 21 %
Lymphs Abs: 1.8 10*3/uL (ref 0.7–4.0)
MCH: 30.2 pg (ref 26.0–34.0)
MCHC: 33.1 g/dL (ref 30.0–36.0)
MCV: 91.1 fL (ref 80.0–100.0)
Monocytes Absolute: 1 10*3/uL (ref 0.1–1.0)
Monocytes Relative: 11 %
Neutro Abs: 5.9 10*3/uL (ref 1.7–7.7)
Neutrophils Relative %: 67 %
Platelets: 219 10*3/uL (ref 150–400)
RBC: 4.97 MIL/uL (ref 4.22–5.81)
RDW: 12.3 % (ref 11.5–15.5)
WBC: 8.8 10*3/uL (ref 4.0–10.5)
nRBC: 0 % (ref 0.0–0.2)

## 2019-08-19 LAB — PROCALCITONIN: Procalcitonin: <0.1 ng/mL

## 2019-08-19 LAB — D-DIMER, QUANTITATIVE: D-Dimer, Quant: 0.62 ug/mL-FEU — ABNORMAL HIGH (ref 0.00–0.50)

## 2019-08-19 LAB — LACTATE DEHYDROGENASE: LDH: 174 U/L (ref 98–192)

## 2019-08-19 LAB — TRIGLYCERIDES: Triglycerides: 86 mg/dL (ref <150)

## 2019-08-19 LAB — FIBRINOGEN: Fibrinogen: 689 mg/dL — ABNORMAL HIGH (ref 210–475)

## 2019-08-19 LAB — LACTIC ACID, PLASMA: Lactic Acid, Venous: 1.3 mmol/L (ref 0.5–1.9)

## 2019-08-19 LAB — POC SARS CORONAVIRUS 2 AG -  ED: SARS Coronavirus 2 Ag: POSITIVE — AB

## 2019-08-19 LAB — C-REACTIVE PROTEIN: CRP: 3.8 mg/dL — ABNORMAL HIGH (ref <1.0)

## 2019-08-19 LAB — FERRITIN: Ferritin: 138 ng/mL (ref 24–336)

## 2019-08-19 LAB — CBG MONITORING, ED: Glucose-Capillary: 199 mg/dL — ABNORMAL HIGH (ref 70–99)

## 2019-08-19 MED ORDER — ALBUTEROL SULFATE HFA 108 (90 BASE) MCG/ACT IN AERS
8.0000 | INHALATION_SPRAY | Freq: Once | RESPIRATORY_TRACT | Status: AC
  Administered 2019-08-19: 8 via RESPIRATORY_TRACT
  Filled 2019-08-19: qty 6.7

## 2019-08-19 NOTE — ED Triage Notes (Signed)
Sob for one week maybe a low grade temp not sure.  coough is productive white  .  Mild sorethroat

## 2019-08-19 NOTE — ED Provider Notes (Signed)
[facility] EMERGENCY DEPARTMENT Provider Note   CSN: [id number] Arrival date & time: 08/19/19  1515     History   Chief Complaint Chief Complaint  Patient presents with   Shortness of Breath    HPI Kevin [name] is a 78 y.o. male with history of bronchitis, former tobacco use, CKD, hx of CVA, A.fib on Eliquis, and insulin dependent DM. The patient states that he has had SOB for several months however over the past week he has had worsening SOB with exertion, a dry cough, wheezing, and loose stools. He also reports a "little fever" but is unsure of what his temp was. He denies any known sick contacts. His blood sugar has been elevated which alarmed him and prompted his visit to [facility] and they referred him to the ED. He denies headache, anosmia, chest pain, abdominal pain, N/V.    HPI  Past Medical History:  Diagnosis Date   A-fib ([place]) 03/20/2018   Acute blood loss anemia    Benign essential HTN    Benign prostatic hyperplasia with urinary retention    Cerebrovascular accident (CVA) ([place])    CKD (chronic kidney disease), stage III    Diabetes ([place]) 03/20/2018   Diabetes mellitus type 2 in nonobese [facility])    Essential hypertension 03/20/2018   Former tobacco use    H/O ulcerative colitis 1993   Hypertension    Hypokalemia    Leukocytosis    New onset atrial fibrillation ([place])    Osteomyelitis ([place])    a. 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   PAF (paroxysmal atrial fibrillation) ([place])    a. dx 03/2018 in setting of stroke, osteomyelitis.   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[place])    Stroke ([employer]) 03/2018   Stroke-like episode ([place]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 03/16/2018   Tachycardia 03/20/2018   Wrist fracture    Left    Patient Active Problem List   Diagnosis Date Noted   UTI (urinary tract infection) 12/15/2018   Chronic diastolic  CHF (congestive heart failure) ([place]) 12/15/2018   HLD (hyperlipidemia) 12/15/2018   Dehiscence of amputation stump (HCC)    Acute CHF (congestive heart failure) ([place]) 05/07/2018   Acute on chronic diastolic CHF (congestive heart failure) ([place])    Acute on chronic congestive heart failure ([place]) 05/06/2018   Renal insufficiency 05/06/2018   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   Debility 03/31/2018   Urinary retention with incomplete bladder emptying 03/31/2018   History of transmetatarsal amputation of left foot (HCC)    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 (HCC)    Status post transmetatarsal amputation of left foot (HCC)    Postoperative pain    Benign prostatic hyperplasia with urinary retention    New onset atrial fibrillation (HCC)    Incontinence of feces    Cerebrovascular accident (CVA) ([place])    Benign essential HTN    Diabetes mellitus type 2 in nonobese (HCC)    Hypokalemia    Paroxysmal atrial fibrillation (HCC)    Leukocytosis    SIRS (systemic inflammatory response syndrome) (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   Fracture of radius, distal, left, closed 12/19/2012    Past Surgical History:  Procedure Laterality Date   AMPUTATION Left 03/24/2018   Procedure: Transmetatarsal Amputation Left Foot;  Surgeon: [name], MD;  Location: [facility];  Service: Orthopedics;  Laterality: Left;   CARDIOVERSION N/A 05/05/2018   Procedure: CARDIOVERSION;  Surgeon: [name], MD;  Location: [facility] ENDOSCOPY;  Service: Cardiovascular;  Laterality: N/A;   COLONOSCOPY  12/2017   benign, remission from ulcerative colitis in 90s   I&D EXTREMITY Left 03/17/2018   Procedure: IRRIGATION AND DEBRIDEMENT FOOT;  Surgeon: [name], MD;  Location: [facility];  Service: Orthopedics;  Laterality: Left;   STUMP REVISION Left 05/10/2018   Procedure: REVISION LEFT TRANSMETATARSAL AMPUTATION;  Surgeon: [name], MD;  Location: [facility];  Service: Orthopedics;  Laterality: Left;        Home Medications    Prior to Admission medications   Medication Sig Start Date End Date Taking? Authorizing Provider  diltiazem (CARDIZEM CD) 120 MG 24 hr capsule Take 1 capsule (120 mg total) by mouth daily. 10/23/18   Burnell Blanks, MD  ELIQUIS 5 MG TABS tablet TAKE 1 TABLET TWICE A DAY 04/30/19   Burnell Blanks, MD  FLUoxetine (PROZAC) 20 MG capsule Take 20 mg by mouth daily.    [provider]  furosemide (LASIX) 20 MG tablet TAKE 1 TABLET BY MOUTH DAILY. YOU MAY TAKE AN EXTRA TABLET FOR INCREASED SWELLING OR WEIGHT GAIN 01/22/19   Burnell Blanks, MD  insulin glargine (LANTUS) 100 UNIT/ML injection Inject 0.16 mLs (16 Units total) into the skin 2 (two) times daily for 30 days. 12/19/18 07/19/19  Amin, Jeanella Flattery, MD  mesalamine (APRISO) 0.375 g 24 hr capsule Take 750 mg by mouth 2 (two) times daily before a meal.     [provider]  metoprolol succinate (TOPROL-XL) 100 MG 24 hr tablet Take 100 mg by mouth daily. Take with or immediately following a meal.    [provider]  Multiple Vitamin (MULTIVITAMIN WITH MINERALS) TABS tablet Take 1 tablet by mouth daily.    [provider]  rosuvastatin (CRESTOR) 10 MG tablet Take 5 mg by mouth daily.    [provider]  tamsulosin (FLOMAX) 0.4 MG CAPS capsule Take 1 capsule (0.4 mg total) by mouth 2 times daily at 12 noon and 4 pm. 03/31/18   Love, Ivan Anchors, PA-C    trimethoprim (TRIMPEX) 100 MG tablet Take 100 mg by mouth at bedtime.  08/02/18   [provider]    Family History Family History  Problem Relation Age of Onset   Hypertension Father     Social History Social History   Tobacco Use   Smoking status: Former Smoker    Packs/day: 1.00    Types: Cigarettes    Start date: 1963    Quit date: 1985    Years since quitting: 35.9   Smokeless tobacco: Never Used  Substance Use Topics   Alcohol use: Yes    Alcohol/week: 4.0 standard drinks    Types: 4 Cans of beer per week    Comment: social    Drug use: Never     Allergies   Patient has no active allergies.   Review of Systems Review of Systems  Constitutional: Positive for  fatigue and fever.  HENT: Positive for congestion.   Respiratory: Positive for cough, shortness of breath and wheezing.   Cardiovascular: Negative for chest pain.  Gastrointestinal: Positive for diarrhea. Negative for abdominal pain, nausea and vomiting.  All other systems reviewed and are negative.    Physical Exam Updated Vital Signs BP 136/82 (BP Location: Right Arm)    Pulse 85    Temp 99.3 F (37.4 C) (Oral)    Resp 18    Ht 6' 1"  (1.854 m)    Wt 101.2 kg    SpO2 91%    BMI 29.42 kg/m   Physical Exam Vitals signs and nursing note reviewed.  Constitutional:      General: He is not in acute distress.    Appearance: He is well-developed. He is not ill-appearing.  HENT:     Head: Normocephalic and atraumatic.  Eyes:     General: No scleral icterus.       Right eye: No discharge.        Left eye: No discharge.     Conjunctiva/sclera: Conjunctivae normal.     Pupils: Pupils are equal, round, and reactive to light.  Neck:     Musculoskeletal: Normal range of motion.  Cardiovascular:     Rate and Rhythm: Normal rate and regular rhythm.  Pulmonary:     Effort: Pulmonary effort is normal. No respiratory distress.     Breath sounds: Normal breath sounds.  Abdominal:     General:  There is no distension.     Palpations: Abdomen is soft.     Tenderness: There is no abdominal tenderness.  Skin:    General: Skin is warm and dry.  Neurological:     Mental Status: He is alert and oriented to person, place, and time.  Psychiatric:        Behavior: Behavior normal.      ED Treatments / Results  Labs (all labs ordered are listed, but only abnormal results are displayed) Labs Reviewed  POC SARS CORONAVIRUS 2 AG -  ED - Abnormal; Notable for the following components:      Result Value   SARS Coronavirus 2 Ag POSITIVE (*)    All other components within normal limits  CBG MONITORING, ED - Abnormal; Notable for the following components:   Glucose-Capillary 199 (*)    All other components within normal limits  CULTURE, BLOOD (ROUTINE X 2)  CULTURE, BLOOD (ROUTINE X 2)  LACTIC ACID, PLASMA  LACTIC ACID, PLASMA  CBC WITH DIFFERENTIAL/PLATELET  COMPREHENSIVE METABOLIC PANEL  D-DIMER, QUANTITATIVE (NOT AT [facility])  PROCALCITONIN  LACTATE DEHYDROGENASE  FERRITIN  TRIGLYCERIDES  FIBRINOGEN  C-REACTIVE PROTEIN    EKG None  Radiology Dg Chest Portable 1 View  Result Date: 08/19/2019 CLINICAL DATA:  Productive cough, shortness of breath EXAM: PORTABLE CHEST 1 VIEW COMPARISON:  05/06/2018 FINDINGS: The heart size and mediastinal contours are within normal limits. Streaky bibasilar opacities. No large pleural fluid collection. No pneumothorax. IMPRESSION: Streaky bibasilar opacities which may reflect atelectasis versus atypical infection. Electronically Signed   By: Davina Poke M.D.   On: 08/19/2019 16:08    Procedures Procedures (including critical care time)  CRITICAL CARE Performed by: Recardo Evangelist   Total critical care time: 35 minutes  Critical care time was exclusive of separately billable procedures and treating other patients.  Critical care was necessary to treat or prevent imminent or life-threatening deterioration.  Critical care was time  spent personally by me on the following activities:  development of treatment plan with patient and/or surrogate as well as nursing, discussions with consultants, evaluation of patient's response to treatment, examination of patient, obtaining history from patient or surrogate, ordering and performing treatments and interventions, ordering and review of laboratory studies, ordering and review of radiographic studies, pulse oximetry and re-evaluation of patient's condition.  Medications Ordered in ED Medications  albuterol (VENTOLIN HFA) 108 (90 Base) MCG/ACT inhaler 8 puff (has no administration in time range)     Initial Impression / Assessment and Plan / ED Course  I have reviewed the triage vital signs and the nursing notes.  Pertinent labs & imaging results that were available during my care of the patient were reviewed by me and considered in my medical decision making (see chart for details).  78 year old male presents with SOB, cough, wheezing and subjective fever. On exam he is flushed appearing but afebrile. O2 sats are in the low 90s. He is very tachypneic with minimal exertion. Heart is regular rate and rhythm. Lung exam is remarkable for diffuse wheezing. Abdomen is soft, non tender. Will obtain labs, EKG, CXR, POC COVID. Will give albuterol tx.  CXR shows streaky bibasilar opacities vs atelectasis. EKG is SR with PACs and RBBB. COVID is positive. Sats are dropping in the high 80s and he is requiring 4L via [place] to maintain >94%. Plan is to admit. At shift change labs are pending. Care signed out to Shary Key PA-C   Final Clinical Impressions(s) / ED Diagnoses   Final diagnoses:  [id number]    ED Discharge Orders    None       Iris Pert 08/19/19 2139    Milton Ferguson, MD 08/20/19 0830

## 2019-08-19 NOTE — ED Notes (Addendum)
Chief complaints diarrhea and shortness of breathe Cough X 2-3 weeks Diarrhea X 2 in the last 24hours "could not control his bowel" Shortness of breath X 2-3 months Has been on antibiotics X 1 year

## 2019-08-19 NOTE — ED Notes (Signed)
At 1445 obtained BG= 249.

## 2019-08-19 NOTE — ED Provider Notes (Signed)
[facility]    CSN: [id number] Arrival date & time: 08/19/19  1351      History   Chief Complaint Chief Complaint  Patient presents with  . Diarrhea  . Chills  . Shortness of Breath    HPI Lige Blanchette is a 78 y.o. male.   HPI Abundio Lempert is a 78 y.o. male presenting to UC with c/o 6 days of gradually worsening fatigue, mild body aches, chills, subjective fever, SOB, mild dry cough. Two days of diarrhea. He had 3-4 episodes of loose watery stool the first day and has had 2 episodes of diarrhea today with an episode of stool incontinence.  He reports elevated blood sugar this week as well. He normally runs in the 150s but today at 10AM his sugar was 299.  No known exposure to Covid but his wife does have some concern he may have Covid. Wife is asymptomatic and no known sick contacts. His wife drove him to UC after encouraging him to seek care.  Denies nausea or vomiting. No recent travel. He did have a hx of AKI as well as afib earlier in the year.  He has a hx of CHF but denies recent swelling of feet/legs or hands. No chest pain at this time.     Past Medical History:  Diagnosis Date  . A-fib ([place]) 03/20/2018  . Acute blood loss anemia   . Benign essential HTN   . Benign prostatic hyperplasia with urinary retention   . Cerebrovascular accident (CVA) ([place])   . CKD (chronic kidney disease), stage III   . Diabetes ([place]) 03/20/2018  . Diabetes mellitus type 2 in nonobese (HCC)   . Essential hypertension 03/20/2018  . Former tobacco use   . H/O ulcerative colitis 1993  . Hypertension   . Hypokalemia   . Leukocytosis   . New onset atrial fibrillation ([place])   . Osteomyelitis ([place])    a. 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  [name] (paroxysmal atrial fibrillation) ([place])    a. dx 03/2018 in setting of stroke, osteomyelitis.  . Sepsis ([place]) 03/20/2018  . Stage 3 chronic kidney disease    pt/family unaware  . Status post transmetatarsal amputation of left  foot ([place])   . Stroke ([place]) 03/2018  . Stroke-like episode ([place]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 03/16/2018  . Tachycardia 03/20/2018  . Wrist fracture    Left    Patient Active Problem List   Diagnosis Date Noted  . UTI (urinary tract infection) 12/15/2018  . Chronic diastolic CHF (congestive heart failure) ([place]) 12/15/2018  . HLD (hyperlipidemia) 12/15/2018  . Dehiscence of amputation stump ([place])   . Acute CHF (congestive heart failure) ([place]) 05/07/2018  . Acute on chronic diastolic CHF (congestive heart failure) ([place])   . Acute on chronic congestive heart failure ([place]) 05/06/2018  . Renal insufficiency 05/06/2018  . 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 Debility 03/31/2018  . Urinary retention with incomplete bladder emptying 03/31/2018  . History of transmetatarsal amputation of left foot ([place])   . 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 ([place])   . 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 Postoperative pain   . Benign prostatic hyperplasia with urinary retention   . New onset atrial fibrillation ([place])   . Incontinence of feces   . Cerebrovascular accident (CVA) ([place])   . Benign essential HTN   . Diabetes mellitus type 2 in nonobese (HCC)   . Hypokalemia   .  Paroxysmal atrial fibrillation (HCC)   . Leukocytosis   . SIRS (systemic inflammatory response syndrome) (HCC)   . Acute blood loss anemia   . Acute renal failure superimposed on stage 3 chronic kidney disease ([place])   . Essential hypertension 03/20/2018  . Tachycardia 03/20/2018  . Sepsis ([place]) 03/20/2018  . Diabetes ([place]) 03/20/2018  . Subacute osteomyelitis, left ankle and foot ([place]) 03/20/2018  . A-fib ([place]) 03/20/2018  . Stroke-like episode ([place]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 03/16/2018  . Strain of left tibialis anterior muscle 12/19/2015  . 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 Fracture of radius, distal, left, closed 12/19/2012    Past Surgical History:  Procedure  Laterality Date  . AMPUTATION Left 03/24/2018   Procedure: Transmetatarsal Amputation Left Foot;  Surgeon: [name], MD;  Location: [facility];  Service: Orthopedics;  Laterality: Left;  . CARDIOVERSION N/A 05/05/2018   Procedure: CARDIOVERSION;  Surgeon: [name], MD;  Location: [facility] ENDOSCOPY;  Service: Cardiovascular;  Laterality: N/A;  . COLONOSCOPY  12/2017   benign, remission from ulcerative colitis in 90s  . I&D EXTREMITY Left 03/17/2018   Procedure: IRRIGATION AND DEBRIDEMENT FOOT;  Surgeon: [name], MD;  Location: [facility];  Service: Orthopedics;  Laterality: Left;  . STUMP REVISION Left 05/10/2018   Procedure: REVISION LEFT TRANSMETATARSAL AMPUTATION;  Surgeon: [name], MD;  Location: [facility];  Service: Orthopedics;  Laterality: Left;       Home Medications    Prior to Admission medications   Medication Sig Start Date End Date Taking? Authorizing Provider  diltiazem (CARDIZEM CD) 120 MG 24 hr capsule Take 1 capsule (120 mg total) by mouth daily. 10/23/18   Burnell Blanks, MD  ELIQUIS 5 MG TABS tablet TAKE 1 TABLET TWICE A DAY 04/30/19   Burnell Blanks, MD  FLUoxetine (PROZAC) 20 MG capsule Take 20 mg by mouth daily.    [provider]  furosemide (LASIX) 20 MG tablet TAKE 1 TABLET BY MOUTH DAILY. YOU MAY TAKE AN EXTRA TABLET FOR INCREASED SWELLING OR WEIGHT GAIN 01/22/19   Burnell Blanks, MD  insulin glargine (LANTUS) 100 UNIT/ML injection Inject 0.16 mLs (16 Units total) into the skin 2 (two) times daily for 30 days. 12/19/18 07/19/19  Amin, Jeanella Flattery, MD  mesalamine (APRISO) 0.375 g 24 hr capsule Take 750 mg by mouth 2 (two) times daily before a meal.     [provider]  metoprolol succinate (TOPROL-XL) 100 MG 24 hr tablet Take 100 mg by mouth daily. Take with or immediately following a meal.    [provider]  Multiple Vitamin (MULTIVITAMIN WITH MINERALS) TABS tablet Take 1 tablet by mouth daily.     [provider]  rosuvastatin (CRESTOR) 10 MG tablet Take 5 mg by mouth daily.    [provider]  tamsulosin (FLOMAX) 0.4 MG CAPS capsule Take 1 capsule (0.4 mg total) by mouth 2 times daily at 12 noon and 4 pm. 03/31/18   Love, Ivan Anchors, PA-C  trimethoprim (TRIMPEX) 100 MG tablet Take 100 mg by mouth at bedtime.  08/02/18   [provider]    Family History Family History  Problem Relation Age of Onset  . Hypertension Father     Social History Social History   Tobacco Use  . Smoking status: Former Smoker    Packs/day: 1.00    Types: Cigarettes    Start date: 1963    Quit date: 1985    Years since quitting:  35.9  . Smokeless tobacco: Never Used  Substance Use Topics  . Alcohol use: Yes    Alcohol/week: 4.0 standard drinks    Types: 4 Cans of beer per week    Comment: social   . Drug use: Never     Allergies   Patient has no active allergies.   Review of Systems Review of Systems  Constitutional: Positive for appetite change (decreased over this last week), chills, fatigue and fever ( subjective).  HENT: Positive for congestion (minimal). Negative for ear pain, sore throat, trouble swallowing and voice change.   Respiratory: Positive for cough and shortness of breath.   Cardiovascular: Negative for chest pain and palpitations.  Gastrointestinal: Positive for diarrhea. Negative for abdominal pain, nausea and vomiting.  Musculoskeletal: Positive for arthralgias, back pain and myalgias.       Body aches  Skin: Negative for rash.  Neurological: Negative for dizziness, light-headedness and headaches.     Physical Exam Triage Vital Signs ED Triage Vitals  Enc Vitals Group     BP 08/19/19 1419 136/72     Pulse Rate 08/19/19 1419 91     Resp 08/19/19 1419 20     Temp 08/19/19 1419 99.2 F (37.3 C)     Temp Source 08/19/19 1419 Oral     SpO2 08/19/19 1419 93 %     Weight 08/19/19 1412 233 lb (105.7 kg)     Height 08/19/19 1412 6\' 1"  (1.854  m)     Head Circumference --      Peak Flow --      Pain Score 08/19/19 1421 0     Pain Loc --      Pain Edu? --      Excl. in [place]? --    No data found.  Updated Vital Signs BP 136/72 (BP Location: Right Arm)   Pulse 91   Temp 99.2 F (37.3 C) (Oral)   Resp 20   Ht 6\' 1"  (1.854 m)   Wt 233 lb (105.7 kg)   SpO2 93%   BMI 30.74 kg/m   Visual Acuity Right Eye Distance:   Left Eye Distance:   Bilateral Distance:    Right Eye Near:   Left Eye Near:    Bilateral Near:     Physical Exam Vitals signs and nursing note reviewed.  Constitutional:      Appearance: He is well-developed.     Comments: Elderly male sitting on exam bed, alert and oriented. Cooperative during exam.  HENT:     Head: Normocephalic and atraumatic.     Right Ear: Tympanic membrane and ear canal normal.     Left Ear: Tympanic membrane and ear canal normal.     Nose: Nose normal.     Mouth/Throat:     Lips: Pink.     Mouth: Mucous membranes are moist.     Pharynx: Oropharynx is clear. Uvula midline.  Neck:     Musculoskeletal: Normal range of motion and neck supple.  Cardiovascular:     Rate and Rhythm: Normal rate and regular rhythm.  Pulmonary:     Effort: Pulmonary effort is normal.     Breath sounds: Examination of the right-lower field reveals wheezing and rhonchi. Wheezing and rhonchi present. No decreased breath sounds or rales.  Abdominal:     Palpations: Abdomen is soft.     Tenderness: There is no abdominal tenderness.  Musculoskeletal: Normal range of motion.     Right lower leg: No edema.  Left lower leg: No edema.  Lymphadenopathy:     Cervical: No cervical adenopathy.  Skin:    General: Skin is warm and dry.     Capillary Refill: Capillary refill takes less than 2 seconds.  Neurological:     Mental Status: He is alert and oriented to person, place, and time.  Psychiatric:        Behavior: Behavior normal.      UC Treatments / Results  Labs (all labs ordered are  listed, but only abnormal results are displayed) Labs Reviewed - No data to display  EKG   Radiology No results found.  Procedures Procedures (including critical care time)  Medications Ordered in UC Medications - No data to display  Initial Impression / Assessment and Plan / UC Course  I have reviewed the triage vital signs and the nursing notes.  Pertinent labs & imaging results that were available during my care of the patient were reviewed by me and considered in my medical decision making (see chart for details).     Due to extensive hx of worsening symptoms for 1 week, recommend further evaluation and treatment in emergency department. Pt and wife declined EMS transport. Pt feels comfortable having wife drive him POV to [facility], where he has been before.  Pt discharged in stable condition. AVS provided.  Final Clinical Impressions(s) / UC Diagnoses   Final diagnoses:  Wheeze  Shortness of breath  Weakness  Diarrhea, unspecified type  Low grade fever  Hyperglycemia     Discharge Instructions      You have declined EMS transport. It is still recommended you have your wife drive your directly to [facility] Emergency Department for further evaluation and treatment of your symptoms, especially due to you history of heart and kidney disease.    ED Prescriptions    None     PDMP not reviewed this encounter.   Noe Gens, [place] 08/19/19 1510

## 2019-08-19 NOTE — Discharge Instructions (Signed)
°  You have declined EMS transport. It is still recommended you have your wife drive your directly to [facility] Emergency Department for further evaluation and treatment of your symptoms, especially due to you history of heart and kidney disease.

## 2019-08-19 NOTE — ED Provider Notes (Signed)
2155: Pt handed off to me pending admission for hypoxia due to COVID illness.  See previous provider note for full details.  Work up thus far +COVID, CXR with opacities favor atypical infection.  Pending labs at shift change, anticipate admission. Albuterol inhaler ordered, will order decadron. On 2 L [place] with stable SpO2 >92%. BP normal. Afebrile.   0000: Dr Myna Hidalgo to admit. Re-evaluated SpO2 > 94%.       Kinnie Feil, PA-C 08/20/19 0010    Varney Biles, MD 08/21/19 1757

## 2019-08-19 NOTE — ED Notes (Signed)
POC CoV-2 Covid test Positive, Called to Dr. Roderic Palau.

## 2019-08-19 NOTE — ED Triage Notes (Signed)
Starting 6 days ago diarrhea, chills, sense of fever and shortness of breath. Patient has been congested, cough. States blood sugars abnormal this week with last one at 1000=299. No known exposure to covid positive person; has not travelled.  Has  Not had influenza vacc this season.

## 2019-08-20 ENCOUNTER — Inpatient Hospital Stay (HOSPITAL_COMMUNITY): Payer: Medicare Other

## 2019-08-20 ENCOUNTER — Encounter (HOSPITAL_COMMUNITY): Payer: Self-pay | Admitting: Family Medicine

## 2019-08-20 DIAGNOSIS — J9601 Acute respiratory failure with hypoxia: Secondary | ICD-10-CM | POA: Diagnosis present

## 2019-08-20 DIAGNOSIS — N1832 Chronic kidney disease, stage 3b: Secondary | ICD-10-CM | POA: Diagnosis present

## 2019-08-20 DIAGNOSIS — J9610 Chronic respiratory failure, unspecified whether with hypoxia or hypercapnia: Secondary | ICD-10-CM | POA: Diagnosis present

## 2019-08-20 LAB — CBG MONITORING, ED
Glucose-Capillary: 186 mg/dL — ABNORMAL HIGH (ref 70–99)
Glucose-Capillary: 198 mg/dL — ABNORMAL HIGH (ref 70–99)
Glucose-Capillary: 208 mg/dL — ABNORMAL HIGH (ref 70–99)
Glucose-Capillary: 274 mg/dL — ABNORMAL HIGH (ref 70–99)
Glucose-Capillary: 304 mg/dL — ABNORMAL HIGH (ref 70–99)

## 2019-08-20 LAB — HEMOGLOBIN A1C
Hgb A1c MFr Bld: 8.7 % — ABNORMAL HIGH (ref 4.8–5.6)
Mean Plasma Glucose: 202.99 mg/dL

## 2019-08-20 LAB — URINALYSIS, ROUTINE W REFLEX MICROSCOPIC
Bilirubin Urine: NEGATIVE
Glucose, UA: >=500 mg/dL — AB
Ketones, ur: 5 mg/dL — AB
Nitrite: NEGATIVE
Protein, ur: NEGATIVE mg/dL
Specific Gravity, Urine: 1.014 (ref 1.005–1.030)
pH: 5 (ref 5.0–8.0)

## 2019-08-20 MED ORDER — INSULIN GLARGINE 100 UNIT/ML ~~LOC~~ SOLN
20.0000 [IU] | Freq: Two times a day (BID) | SUBCUTANEOUS | Status: DC
  Administered 2019-08-20 – 2019-08-21 (×4): 20 [IU] via SUBCUTANEOUS
  Filled 2019-08-20 (×6): qty 0.2

## 2019-08-20 MED ORDER — INSULIN ASPART 100 UNIT/ML ~~LOC~~ SOLN
0.0000 [IU] | Freq: Three times a day (TID) | SUBCUTANEOUS | Status: DC
  Administered 2019-08-20: 3 [IU] via SUBCUTANEOUS
  Administered 2019-08-20: 7 [IU] via SUBCUTANEOUS
  Administered 2019-08-20: 5 [IU] via SUBCUTANEOUS
  Administered 2019-08-21: 7 [IU] via SUBCUTANEOUS
  Administered 2019-08-21: 9 [IU] via SUBCUTANEOUS

## 2019-08-20 MED ORDER — SODIUM CHLORIDE 0.9 % IV SOLN
200.0000 mg | Freq: Once | INTRAVENOUS | Status: AC
  Administered 2019-08-20: 200 mg via INTRAVENOUS
  Filled 2019-08-20: qty 40

## 2019-08-20 MED ORDER — DEXAMETHASONE SODIUM PHOSPHATE 10 MG/ML IJ SOLN
6.0000 mg | INTRAMUSCULAR | Status: DC
  Administered 2019-08-20 – 2019-08-24 (×5): 6 mg via INTRAVENOUS
  Filled 2019-08-20 (×5): qty 1

## 2019-08-20 MED ORDER — TRIMETHOPRIM 100 MG PO TABS
100.0000 mg | ORAL_TABLET | Freq: Every day | ORAL | Status: DC
  Administered 2019-08-20 – 2019-08-23 (×5): 100 mg via ORAL
  Filled 2019-08-20 (×6): qty 1

## 2019-08-20 MED ORDER — DILTIAZEM HCL ER COATED BEADS 120 MG PO CP24
120.0000 mg | ORAL_CAPSULE | Freq: Every day | ORAL | Status: DC
  Administered 2019-08-20 – 2019-08-24 (×5): 120 mg via ORAL
  Filled 2019-08-20 (×6): qty 1

## 2019-08-20 MED ORDER — FUROSEMIDE 20 MG PO TABS
20.0000 mg | ORAL_TABLET | Freq: Every day | ORAL | Status: DC
  Administered 2019-08-20 – 2019-08-24 (×5): 20 mg via ORAL
  Filled 2019-08-20 (×5): qty 1

## 2019-08-20 MED ORDER — METOPROLOL SUCCINATE ER 100 MG PO TB24
100.0000 mg | ORAL_TABLET | Freq: Every day | ORAL | Status: DC
  Administered 2019-08-20 – 2019-08-23 (×3): 100 mg via ORAL
  Filled 2019-08-20 (×5): qty 1

## 2019-08-20 MED ORDER — ROSUVASTATIN CALCIUM 5 MG PO TABS
5.0000 mg | ORAL_TABLET | Freq: Every day | ORAL | Status: DC
  Administered 2019-08-20 – 2019-08-23 (×4): 5 mg via ORAL
  Filled 2019-08-20 (×4): qty 1

## 2019-08-20 MED ORDER — TAMSULOSIN HCL 0.4 MG PO CAPS
0.4000 mg | ORAL_CAPSULE | Freq: Two times a day (BID) | ORAL | Status: DC
  Administered 2019-08-20 – 2019-08-24 (×9): 0.4 mg via ORAL
  Filled 2019-08-20 (×10): qty 1

## 2019-08-20 MED ORDER — ALBUTEROL SULFATE HFA 108 (90 BASE) MCG/ACT IN AERS
2.0000 | INHALATION_SPRAY | Freq: Four times a day (QID) | RESPIRATORY_TRACT | Status: DC
  Administered 2019-08-20 – 2019-08-24 (×18): 2 via RESPIRATORY_TRACT
  Filled 2019-08-20 (×2): qty 6.7

## 2019-08-20 MED ORDER — FLUOXETINE HCL 20 MG PO CAPS
20.0000 mg | ORAL_CAPSULE | Freq: Every day | ORAL | Status: DC
  Administered 2019-08-20 – 2019-08-24 (×5): 20 mg via ORAL
  Filled 2019-08-20 (×6): qty 1

## 2019-08-20 MED ORDER — APIXABAN 5 MG PO TABS
5.0000 mg | ORAL_TABLET | Freq: Two times a day (BID) | ORAL | Status: DC
  Administered 2019-08-20 – 2019-08-24 (×10): 5 mg via ORAL
  Filled 2019-08-20 (×11): qty 1

## 2019-08-20 MED ORDER — ONDANSETRON HCL 4 MG PO TABS: 4.0000 mg | ORAL_TABLET | Freq: Four times a day (QID) | ORAL | Status: DC | PRN

## 2019-08-20 MED ORDER — LINAGLIPTIN 5 MG PO TABS
5.0000 mg | ORAL_TABLET | Freq: Every day | ORAL | Status: DC
  Administered 2019-08-21 – 2019-08-24 (×4): 5 mg via ORAL
  Filled 2019-08-20 (×5): qty 1

## 2019-08-20 MED ORDER — ACETAMINOPHEN 325 MG PO TABS: 650.0000 mg | ORAL_TABLET | Freq: Four times a day (QID) | ORAL | Status: DC | PRN

## 2019-08-20 MED ORDER — SODIUM CHLORIDE 0.9% FLUSH: 3.0000 mL | INTRAVENOUS | Status: DC | PRN

## 2019-08-20 MED ORDER — ONDANSETRON HCL 4 MG/2ML IJ SOLN: 4.0000 mg | Freq: Four times a day (QID) | INTRAMUSCULAR | Status: DC | PRN

## 2019-08-20 MED ORDER — SODIUM CHLORIDE 0.9% FLUSH
3.0000 mL | Freq: Two times a day (BID) | INTRAVENOUS | Status: DC
  Administered 2019-08-20 – 2019-08-24 (×10): 3 mL via INTRAVENOUS

## 2019-08-20 MED ORDER — MESALAMINE 1.2 G PO TBEC
1.2000 g | DELAYED_RELEASE_TABLET | Freq: Two times a day (BID) | ORAL | Status: DC
  Administered 2019-08-20 – 2019-08-24 (×8): 1.2 g via ORAL
  Filled 2019-08-20 (×12): qty 1

## 2019-08-20 MED ORDER — INSULIN ASPART 100 UNIT/ML ~~LOC~~ SOLN: 0.0000 [IU] | Freq: Every day | SUBCUTANEOUS | Status: DC

## 2019-08-20 MED ORDER — SODIUM CHLORIDE 0.9 % IV SOLN
100.0000 mg | INTRAVENOUS | Status: AC
  Administered 2019-08-21 – 2019-08-24 (×4): 100 mg via INTRAVENOUS
  Filled 2019-08-20 (×3): qty 100
  Filled 2019-08-20: qty 20
  Filled 2019-08-20: qty 100

## 2019-08-20 MED ORDER — SODIUM CHLORIDE 0.9 % IV SOLN: 250.0000 mL | INTRAVENOUS | Status: DC | PRN

## 2019-08-20 MED ORDER — HYDROCODONE-ACETAMINOPHEN 5-325 MG PO TABS: 1.0000 | ORAL_TABLET | Freq: Four times a day (QID) | ORAL | Status: DC | PRN

## 2019-08-20 NOTE — ED Notes (Signed)
Breakfast Ordered 

## 2019-08-20 NOTE — H&P (Signed)
History and Physical    [name] [id number] DOB: 17-May-1941 DOA: 08/19/2019  PCP: Kevin Arabian, MD   Patient coming from: Home   Chief Complaint: Fever/chills, cough, SOB, loose stools   HPI: Kevin [name] is a 78 y.o. male with medical history significant for history of CVA, paroxysmal atrial fibrillation on Eliquis, chronic kidney disease stage III, chronic diastolic CHF, ulcerative colitis, and insulin-dependent diabetes mellitus, now presenting to the emergency department for evaluation of cough, shortness of breath, fevers/chills, loose stools, and increased blood glucose.  Patient remarks that he was doing some yard work on 08/13/2019 when he began to develop some subjective fevers and chills.  He also noted some exertional dyspnea and fatigue that same day.  He went on to have a couple days of nonbloody diarrhea without abdominal pain, and has had progressive shortness of breath with cough, chills, fatigue, and increased blood sugar.  He denies chest pain, leg swelling, hemoptysis, abdominal pain, or vomiting.  ED Course: Upon arrival to the ED, patient is found to be afebrile, saturating mid 80s on room air, tachypneic up in the low 30s, and with stable blood pressure.  EKG with sinus rhythm, PVCs, and RBBB.  Chest x-ray features streaky bibasilar opacities.  Chemistry panel is notable for glucose of 172, BUN 34, and creatinine 1.79, similar to priors.  CBC is unremarkable.  COVID-19 antigen test is positive.  Procalcitonin undetectable.  Age-adjusted D-dimer negative.  CRP 3.8.  Blood cultures were collected in the emergency department and the patient was treated with albuterol.  Review of Systems:  All other systems reviewed and apart from HPI, are negative.  Past Medical History:  Diagnosis Date  . A-fib ([place]) 03/20/2018  . Acute blood loss anemia   . Benign essential HTN   . Benign prostatic hyperplasia with urinary retention   . Cerebrovascular accident  (CVA) ([place])   . CKD (chronic kidney disease), stage III   . Diabetes ([place]) 03/20/2018  . Diabetes mellitus type 2 in nonobese (HCC)   . Essential hypertension 03/20/2018  . Former tobacco use   . H/O ulcerative colitis 1993  . Hypertension   . Hypokalemia   . Leukocytosis   . New onset atrial fibrillation ([place])   . Osteomyelitis ([place])    a. 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  [name] (paroxysmal atrial fibrillation) ([place])    a. dx 03/2018 in setting of stroke, osteomyelitis.  . Sepsis ([place]) 03/20/2018  . Stage 3 chronic kidney disease    pt/family unaware  . 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 Stroke ([place]) 03/2018  . Stroke-like episode ([place]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 03/16/2018  . Tachycardia 03/20/2018  . Wrist fracture    Left    Past Surgical History:  Procedure Laterality Date  . AMPUTATION Left 03/24/2018   Procedure: Transmetatarsal Amputation Left Foot;  Surgeon: [name], MD;  Location: [facility];  Service: Orthopedics;  Laterality: Left;  . CARDIOVERSION N/A 05/05/2018   Procedure: CARDIOVERSION;  Surgeon: [name], MD;  Location: [facility] ENDOSCOPY;  Service: Cardiovascular;  Laterality: N/A;  . COLONOSCOPY  12/2017   benign, remission from ulcerative colitis in 90s  . I&D EXTREMITY Left 03/17/2018   Procedure: IRRIGATION AND DEBRIDEMENT FOOT;  Surgeon: [name], MD;  Location: [facility];  Service: Orthopedics;  Laterality: Left;  . STUMP REVISION Left 05/10/2018   Procedure: REVISION LEFT TRANSMETATARSAL AMPUTATION;  Surgeon: [name], MD;  Location: [facility];  Service: Orthopedics;  Laterality: Left;     reports that he quit smoking about 35 years ago. His smoking use included cigarettes. He started smoking about 57 years ago. He smoked 1.00 pack per day. He has never used smokeless tobacco. He reports current alcohol use of about 4.0 standard drinks of alcohol per week. He reports that he does not use drugs.  No Known Allergies  Family  History  Problem Relation Age of Onset  . Hypertension Father      Prior to Admission medications   Medication Sig Start Date End Date Taking? Authorizing Provider  diltiazem (CARDIZEM CD) 120 MG 24 hr capsule Take 1 capsule (120 mg total) by mouth daily. 10/23/18  Yes Burnell Blanks, MD  ELIQUIS 5 MG TABS tablet TAKE 1 TABLET TWICE A DAY 04/30/19  Yes Burnell Blanks, MD  FLUoxetine (PROZAC) 20 MG capsule Take 20 mg by mouth daily.   Yes [provider]  furosemide (LASIX) 20 MG tablet TAKE 1 TABLET BY MOUTH DAILY. YOU MAY TAKE AN EXTRA TABLET FOR INCREASED SWELLING OR WEIGHT GAIN Patient taking differently: Take 20 mg by mouth daily.  01/22/19  Yes Burnell Blanks, MD  insulin glargine (LANTUS) 100 UNIT/ML injection Inject 0.16 mLs (16 Units total) into the skin 2 (two) times daily for 30 days. Patient taking differently: Inject 35 Units into the skin 2 (two) times daily.  12/19/18 08/19/19 Yes Amin, Jeanella Flattery, MD  mesalamine (APRISO) 0.375 g 24 hr capsule Take 750 mg by mouth 2 (two) times daily before a meal.    Yes [provider]  metoprolol succinate (TOPROL-XL) 100 MG 24 hr tablet Take 100 mg by mouth daily. Take with or immediately following a meal.   Yes [provider]  Multiple Vitamin (MULTIVITAMIN WITH MINERALS) TABS tablet Take 1 tablet by mouth daily.   Yes [provider]  rosuvastatin (CRESTOR) 10 MG tablet Take 5 mg by mouth daily.   Yes [provider]  tamsulosin (FLOMAX) 0.4 MG CAPS capsule Take 1 capsule (0.4 mg total) by mouth 2 times daily at 12 noon and 4 pm. 03/31/18  Yes Love, Ivan Anchors, PA-C  trimethoprim (TRIMPEX) 100 MG tablet Take 100 mg by mouth at bedtime.  08/02/18  Yes [provider]    Physical Exam: Vitals:   08/19/19 2215 08/19/19 2245 08/19/19 2300 08/19/19 2315  BP: (!) 142/70 138/78 139/70 127/75  Pulse: 80 83 81 87  Resp: 11 17 14  (!) 31  Temp:      TempSrc:      SpO2:  95% 96% 94% 92%  Weight:      Height:        Constitutional: NAD, calm  Eyes: PERTLA, lids and conjunctivae normal ENMT: Mucous membranes are moist. Posterior pharynx clear of any exudate or lesions.   Neck: normal, supple, no masses, no thyromegaly Respiratory: Mild tachypnea, speaking full sentences. No pallor or cyanosis.  Cardiovascular: S1 & S2 heard, regular rate and rhythm. No extremity edema.    Abdomen: No distension, no tenderness, soft. Bowel sounds active  Musculoskeletal: no clubbing / cyanosis. No joint deformity upper and lower extremities.   Skin: no significant rashes, lesions, ulcers. Warm, dry, well-perfused. Neurologic: No facial asymmetry. Sensation intact. Moving all extremities.  Psychiatric: Alert and oriented to person, place, and situation. Very pleasant, cooperative.      Labs on Admission: I have personally reviewed following labs and imaging studies  CBC: Recent Labs  Lab 08/19/19 2150  WBC  8.8  NEUTROABS 5.9  HGB 15.0  HCT 45.3  MCV 91.1  PLT [id number]   Basic Metabolic Panel: Recent Labs  Lab 08/19/19 2150  NA 132*  K 4.2  CL 100  CO2 22  GLUCOSE 172*  BUN 34*  CREATININE 1.79*  CALCIUM 8.8*   GFR: Estimated Creatinine Clearance: 44.6 mL/min (A) (by C-G formula based on SCr of 1.79 mg/dL (H)). Liver Function Tests: Recent Labs  Lab 08/19/19 2150  AST 36  ALT 31  ALKPHOS 65  BILITOT 0.6  PROT 8.0  ALBUMIN 3.6   No results for input(s): LIPASE, AMYLASE in the last 168 hours. No results for input(s): AMMONIA in the last 168 hours. Coagulation Profile: No results for input(s): INR, PROTIME in the last 168 hours. Cardiac Enzymes: No results for input(s): CKTOTAL, CKMB, CKMBINDEX, TROPONINI in the last 168 hours. BNP (last 3 results) No results for input(s): PROBNP in the last 8760 hours. HbA1C: No results for input(s): HGBA1C in the last 72 hours. CBG: Recent Labs  Lab 08/19/19 1850  GLUCAP 199*   Lipid Profile: Recent Labs     08/19/19 2150  TRIG 86   Thyroid Function Tests: No results for input(s): TSH, T4TOTAL, FREET4, T3FREE, THYROIDAB in the last 72 hours. Anemia Panel: Recent Labs    08/19/19 2150  FERRITIN 138   Urine analysis:    Component Value Date/Time   COLORURINE YELLOW 12/15/2018 2132   APPEARANCEUR CLOUDY (A) 12/15/2018 2132   LABSPEC 1.010 12/15/2018 2132   PHURINE 6.0 12/15/2018 2132   GLUCOSEU >=500 (A) 12/15/2018 2132   HGBUR MODERATE (A) 12/15/2018 2132   BILIRUBINUR NEGATIVE 12/15/2018 2132   [place] NEGATIVE 12/15/2018 2132   PROTEINUR 30 (A) 12/15/2018 2132   NITRITE NEGATIVE 12/15/2018 2132   LEUKOCYTESUR MODERATE (A) 12/15/2018 2132   Sepsis Labs: @LABRCNTIP (procalcitonin:4,lacticidven:4) )No results found for this or any previous visit (from the past 240 hour(s)).   Radiological Exams on Admission: Dg Chest Portable 1 View  Result Date: 08/19/2019 CLINICAL DATA:  Productive cough, shortness of breath EXAM: PORTABLE CHEST 1 VIEW COMPARISON:  05/06/2018 FINDINGS: The heart size and mediastinal contours are within normal limits. Streaky bibasilar opacities. No large pleural fluid collection. No pneumothorax. IMPRESSION: Streaky bibasilar opacities which may reflect atelectasis versus atypical infection. Electronically Signed   By: Davina Poke M.D.   On: 08/19/2019 16:08    EKG: Independently reviewed. Sinus rhythm, PVC's, RBBB. Similar to June 2019.   Assessment/Plan   1. COVID-19 pneumonia; acute hypoxic respiratory failure  - Presents with 7 days of progressive SOB, cough, fatigue, and chills  - Found to have COVID-19 with positive CXR findings, new 5 Lpm supplemental O2 requirement  - Blood cultures were collected in the ED and patient was treated with albuterol  - Start Decadron and remdesivir, continue supplemental O2 as needed, maintain isolation, continue albuterol, trend inflammatory markers, self-prone as tolerated    2. Paroxysmal atrial  fibrillation  - In sinus rhythm on admission  - CHADS-VASc is (CVA x2, age x2, CHF, DM)  - Continue Eliquis, diltiazem, metoprolol    3. Chronic diastolic CHF  - Appears compensated  - Continue Lasix   4. Insulin-dependent DM  - A1c was 10.6% in March 2020  - Managed with Lantus at home  - He is being started on systemic steroid as above  - Continue Lantus with Novolog correctional    5. History of CVA  - Continue Eliquis and statin    6. CKD IIIb -  SCr is 1.79 on admission, similar to priors  - Renally-dose medications, monitor   7. Ulcerative colitis  - Patient reports remission since 1990's  - Continue mesalamine    PPE: CAPR, gown, gloves  DVT prophylaxis: Eliquis  Code Status: Full  Family Communication: Discussed with patient  Consults called: None Admission status: Inpatient     Vianne Bulls, MD [facility] Pager [phone]  If 7PM-7AM, please contact night-coverage [email or web address] Password [email or web address]  08/20/2019, 12:08 AM

## 2019-08-20 NOTE — ED Notes (Signed)
Patient bladder scanned:616 mL. MD notified. Pt In/out catheterized :550 mL

## 2019-08-20 NOTE — ED Notes (Signed)
Lunch Tray Ordered @ 1059.  

## 2019-08-20 NOTE — ED Notes (Signed)
Pt's CBG result was 304. Informed Hassan Rowan - RN.

## 2019-08-20 NOTE — Progress Notes (Addendum)
**Note De-Identified vi Obfusction** PROGRESS NOTE    Kevin [name]  [id number] DOB: 1941-05-04 DOA: 08/19/2019 PCP: Gynelle Arbin, MD   Brief Nrrtive:  Kevin [name] is  78 y.o. mle with medicl history significnt for history of CVA, proxysml tril fibrilltion on Eliquis, chronic kidney disese stge III, chronic distolic CHF, ulcertive colitis, nd insulin-dependent dibetes mellitus, now presenting to the emergency deprtment for evlution of cough, shortness of breth, fevers/chills, loose stools, nd incresed blood glucose.  Ptient remrks tht he ws doing some yrd work on 08/13/2019 when he begn to develop some subjective fevers nd chills.  He lso noted some exertionl dyspne nd ftigue tht sme dy.  He went on to hve  couple dys of nonbloody dirrhe without bdominl pin, nd hs hd progressive shortness of breth with cough, chills, ftigue, nd incresed blood sugr.  He denies chest pin, leg swelling, hemoptysis, bdominl pin, or vomiting.  ED Course: Upon rrivl to the ED, ptient is found to be febrile, sturting mid 80s on room ir, tchypneic up in the low 30s, nd with stble blood pressure.  EKG with sinus rhythm, PVCs, nd RBBB.  Chest x-ry fetures streky bibsilr opcities.  Chemistry pnel is notble for glucose of 172, BUN 34, nd cretinine 1.79, similr to priors.  CBC is unremrkble.  COVID-19 ntigen test is positive.  Proclcitonin undetectble.  Age-djusted D-dimer negtive.  CRP 3.8.  Blood cultures were collected in the emergency deprtment nd the ptient ws treted with lbuterol.  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 COVID-19 pneumoni; cute hypoxic respirtory filure  - Presents with 7 dys of progressive SOB, cough, ftigue, nd chills  - Found to hve COVID-19 with positive CXR findings, new 5 Lpm supplementl O2 requirement  - Stting in low 90's when I ws t bedside on RA - CT chest obtined -> without cute intrthorcic pthology - emphysem with scttered res of subpleurl reticultion nd mild bronchiecttic chnges - no pneumoni on CT nd resolved O2 requirement, uncler sig of O2 requirement t presenttion -> follow blood nd urine cx, continue to w/u lterntive cuses for his symptoms - Blood cultures were collected in the ED nd ptient ws treted with lbuterol  - Continue steroids, remdesivir for now given significnt O2 need on presenttion, though this is significntly improved now - discussed convlescent plsm risk/benefits.  He's greeble to this if needed, would need to review nd sign forml consent.  Reddress if needed. - I/O, dily weights - inflmmtory lbs  COVID-19 Lbs  Recent Lbs    08/19/19 2150  DDIMER 0.62*  FERRITIN 138  LDH 174  CRP 3.8*    No results found for: SARSCOV2NAA  2. Proxysml tril fibrilltion  - In sinus rhythm on dmission  - CHADS-VASc is 6 - 7(CVA, ge, CHF, DM, HTN)  - Continue Eliquis, diltizem, metoprolol    3. Chronic distolic CHF  - Appers compensted  - Continue Lsix   4. Insulin-dependent DM  - A1c ws 10.6% in Mrch 2020  - A1c 8.7 now - Mnged with Lntus t home  - He is being strted on systemic steroid  as above  - Continue Lantus with Novolog correctional   - start tradjenta due to evidence of benefit in COVID 19 and DM  5. History of CV  - Continue Eliquis and statin    6. CKD IIIb - SCr is 1.79 on admission, similar to priors  - Renally-dose medications, monitor   7. Ulcerative colitis  - Patient reports remission since 1990's  -  Continue mesalamine   R lung base subpleural thickening and nodularity - needs repeat CT in 3 months  Partially visualized L sided hydro - follow U and renal US -> addendum - mod bilateral hydro and distended urinary bladder -> check bladder scan and I/O for >300 cc.  Continue flomax.  Per Renal US in 11/2018, pt also had bilateral hydro at that time, though described as mild.  Consider discussion with urology.  DVT prophylaxis: eliquis Code Status: full  Family Communication: none at bedside Disposition Plan: pending further improvement  Consultants:   none  Procedures:   none  ntimicrobials:  nti-infectives (From admission, onward)   Start     Dose/Rate Route Frequency Ordered Stop   08/21/19 1000  remdesivir 100 mg in sodium chloride 0.9 % 250 mL IVPB     100 mg 500 mL/hr over 30 Minutes Intravenous Every 24 hours 08/20/19 0009 08/25/19 0959   08/20/19 0130  trimethoprim (TRIMPEX) tablet 100 mg     100 mg Oral Daily at bedtime 08/20/19 0010     08/20/19 0100  remdesivir 200 mg in sodium chloride 0.9 % 250 mL IVPB     200 mg 500 mL/hr over 30 Minutes Intravenous Once 08/20/19 0009 08/20/19 0224     Subjective: Feels better than at presentation  Objective: Vitals:   08/20/19 0645 08/20/19 0730 08/20/19 0815 08/20/19 1300  BP: (!) 147/88 (!) 144/67 (!) 141/73 (!) 144/81  Pulse: 73 72 77 89  Resp:      Temp:      TempSrc:      SpO2: 97% 92% 95%   Weight:      Height:        Intake/Output Summary (Last 24 hours) at 08/20/2019 1347 Last data filed at 08/20/2019 [id number] Gross per 24 hour  Intake 250 ml  Output -  Net 250 ml   Filed Weights   08/19/19 1533  Weight: 101.2 kg    Examination:  General exam: ppears calm and comfortable  Respiratory system: unlabored Cardiovascular system: RRR Gastrointestinal system: bdomen is nondistended, soft and nontender. Central nervous system: lert and oriented. No focal neurological deficits. Extremities: no LEE,  transmet amputation of LLE Skin: No rashes, lesions or ulcers Psychiatry: Judgement and insight appear normal. Mood & affect appropriate.     Data Reviewed: I have personally reviewed following labs and imaging studies  CBC: Recent Labs  Lab 08/19/19 2150  WBC 8.8  NEUTROBS 5.9  HGB 15.0  HCT 45.3  MCV 91.1  PLT 999333   Basic Metabolic Panel: Recent Labs  Lab 08/19/19 2150  N 132*  K 4.2  CL 100  CO2 22  GLUCOSE 172*  BUN 34*  CRETININE 1.79*  CLCIUM 8.8*   GFR: Estimated Creatinine Clearance: 44.6 mL/min () (by C-G formula based on SCr of 1.79 mg/dL (H)). Liver Function Tests: Recent Labs  Lab 08/19/19 2150  ST 36  LT 31  LKPHOS 65  BILITOT 0.6  PROT 8.0  LBUMIN 3.6   No results for input(s): LIPSE, MYLSE in the last 168 hours. No results **Note De-Identified vi Obfusction** for input(s): AMMONIA in the lst 168 hours. Cogultion Profile: No results for input(s): INR, PROTIME in the lst 168 hours. Crdic Enzymes: No results for input(s): CKTOTAL, CKMB, CKMBINDEX, TROPONINI in the lst 168 hours. BNP (lst 3 results) No results for input(s): PROBNP in the lst 8760 hours. HbA1C: Recent Lbs    08/20/19 0904  HGBA1C 8.7*   CBG: Recent Lbs  Lb 08/19/19 1850 08/20/19 0030 08/20/19 0809 08/20/19 1234  GLUCAP 199* 186* 274* 304*   Lipid Profile: Recent Lbs    08/19/19 2150  TRIG 86   Thyroid Function Tests: No results for input(s): TSH, T4TOTAL, FREET4, T3FREE, THYROIDAB in the lst 72 hours. Anemi Pnel: Recent Lbs    08/19/19 2150  FERRITIN 138   Sepsis Lbs: Recent Lbs  Lb 08/19/19 2150  PROCALCITON <0.10  LATICACIDVEN 1.3    Recent Results (from the pst 240 hour(s))  Blood Culture (routine x 2)     Sttus: None (Preliminry result)   Collection Time: 08/19/19  9:50 PM   Specimen: BLOOD RIGHT ARM  Result Vlue Ref Rnge Sttus   Specimen Description BLOOD RIGHT ARM  Finl   Specil Requests   Finl    BOTTLES DRAWN AEROBIC AND ANAEROBIC  Blood Culture dequte volume   Culture   Finl    NO GROWTH < 12 HOURS Performed t [facility], [place] [address] [place], [place] [address]    Report Sttus PENDING  Incomplete  Blood Culture (routine x 2)     Sttus: None (Preliminry result)   Collection Time: 08/19/19  9:50 PM   Specimen: BLOOD RIGHT HAND  Result Vlue Ref Rnge Sttus   Specimen Description BLOOD RIGHT HAND  Finl   Specil Requests   Finl    BOTTLES DRAWN AEROBIC AND ANAEROBIC Blood Culture dequte volume   Culture   Finl    NO GROWTH < 12 HOURS Performed t [facility], [place] [address], [place],  [address]    Report Sttus PENDING  Incomplete         Rdiology Studies: Ct Chest Wo Contrst  Result Dte: 08/20/2019 CLINICAL DATA:  89 yer old mle with fever nd chills nd shortness of breth. EXAM: CT CHEST WITHOUT CONTRAST TECHNIQUE: Multidetector CT imging of the chest ws performed following the stndrd protocol without IV contrst. COMPARISON:  Chest rdiogrph dted 08/20/2019 FINDINGS: Evlution of this exm is limited in the bsence of intrvenous contrst. Crdiovsculr: There is no crdiomegly or pericrdil effusion. There is multi vessel coronry vsculr clcifiction. Advnced therosclerotic clcifiction of the thorcic ort. The centrl pulmonry rteries re grossly unremrkble. Medistinum/Nodes: No hilr or medistinl denopthy. The esophgus is unremrkble. No medistinl fluid collection. Lungs/Pleur: Moderte centrilobulr emphysem. There is  7 mm left lower lobe subpleurl nodule (series 8, imge 88). Mild bronchiecttic chnges. Scttered res of subpleurl reticultion. Right lung bse subpleurl thickening nd nodulrity mesures 2.9 x 1.1 cm (series 3, imge 63). This my represent n re of scrring however developing infiltrte or mlignncy is not excluded. Follow-up recommended. There is no pleurl effusion or pneumothorx. The centrl irwys re  ptent. Upper Abdomen: Prtilly visulized left-sided hydronephrosis. Musculoskeletl: Old L1 compression frcture with nterior wedging. No cute osseous pthology. IMPRESSION: 1. No cute intrthorcic pthology. 2. Emphysem with scttered res of subpleurl reticultion nd mild bronchiecttic chnges. 3. Right lung bse subpleurl thickening nd nodulrity s described. Follow-up with CT in 3 months recommended. 4. Prtilly visulized left-sided hydronephrosis. 5. Old L1 compression frcture with nterior wedging.  6. Aortic Atherosclerosis (ICD10-I70.0) and Emphysema (ICD10-J43.9). Electronically Signed   By: Anner Crete M.D.   On: 08/20/2019 10:42   Dg Chest Port 1 View  Result Date: 08/20/2019 CLINICAL DATA:  Shortness of breath, positive COVID EXAM: PORTABLE CHEST 1 VIEW COMPARISON:  August 19, 2019 FINDINGS: The left costophrenic angle is partially excluded. Increased density medial right lung base. Stable mild interstitial prominence. No significant pleural effusion. No pneumothorax. Normal heart size. IMPRESSION: Increased density medial right lung base probably reflecting atelectasis. Otherwise stable lung aeration. Electronically Signed   By: Macy Mis M.D.   On: 08/20/2019 08:25   Dg Chest Portable 1 View  Result Date: 08/19/2019 CLINICAL DATA:  Productive cough, shortness of breath EXAM: PORTABLE CHEST 1 VIEW COMPARISON:  05/06/2018 FINDINGS: The heart size and mediastinal contours are within normal limits. Streaky bibasilar opacities. No large pleural fluid collection. No pneumothorax. IMPRESSION: Streaky bibasilar opacities which may reflect atelectasis versus atypical infection. Electronically Signed   By: Davina Poke M.D.   On: 08/19/2019 16:08        Scheduled Meds: . albuterol  2 puff Inhalation Q6H  . apixaban  5 mg Oral BID  . dexamethasone (DECADRON) injection  6 mg Intravenous Q24H  . diltiazem  120 mg Oral Daily  . FLUoxetine  20 mg Oral Daily  .  furosemide  20 mg Oral Daily  . insulin aspart  0-5 Units Subcutaneous QHS  . insulin aspart  0-9 Units Subcutaneous TID WC  . insulin glargine  20 Units Subcutaneous BID  . mesalamine  1.2 g Oral BID AC  . metoprolol succinate  100 mg Oral Daily  . rosuvastatin  5 mg Oral q1800  . sodium chloride flush  3 mL Intravenous Q12H  . tamsulosin  0.4 mg Oral q12n4p  . trimethoprim  100 mg Oral QHS   Continuous Infusions: . sodium chloride    . [START ON 08/21/2019] remdesivir 100 mg in NS 250 mL       LOS: 1 day    Time spent: over 30 min    Fayrene Helper, MD [facility] Pager AMION  If 7PM-7AM, please contact night-coverage [email or web address] Password [email or web address] 08/20/2019, 1:47 PM

## 2019-08-20 NOTE — ED Notes (Signed)
Pt's lunch arrived 

## 2019-08-20 NOTE — ED Notes (Signed)
Called pharmacy RE mesalamine.

## 2019-08-20 NOTE — ED Notes (Signed)
Pt transferred onto hospital bed.

## 2019-08-20 NOTE — ED Notes (Signed)
Dinner Tray Ordered @ 1830.  

## 2019-08-20 NOTE — ED Notes (Signed)
Pt placed on a bedpan.  

## 2019-08-20 NOTE — ED Notes (Signed)
Pt's CBG result was 274. Informed Hassan Rowan - RN.

## 2019-08-20 NOTE — ED Notes (Signed)
Pt. was found filled with fecal matter, this tech gave patient bed bath.

## 2019-08-20 NOTE — ED Notes (Signed)
MS 

## 2019-08-21 ENCOUNTER — Encounter (HOSPITAL_COMMUNITY): Payer: Self-pay

## 2019-08-21 ENCOUNTER — Inpatient Hospital Stay (HOSPITAL_COMMUNITY): Payer: Medicare Other

## 2019-08-21 LAB — GLUCOSE, CAPILLARY
Glucose-Capillary: 306 mg/dL — ABNORMAL HIGH (ref 70–99)
Glucose-Capillary: 378 mg/dL — ABNORMAL HIGH (ref 70–99)
Glucose-Capillary: 374 mg/dL — ABNORMAL HIGH (ref 70–99)

## 2019-08-21 MED ORDER — INSULIN GLARGINE 100 UNIT/ML ~~LOC~~ SOLN
25.0000 [IU] | Freq: Two times a day (BID) | SUBCUTANEOUS | Status: DC
  Administered 2019-08-21 – 2019-08-22 (×2): 25 [IU] via SUBCUTANEOUS
  Filled 2019-08-21 (×4): qty 0.25

## 2019-08-21 MED ORDER — INSULIN ASPART 100 UNIT/ML ~~LOC~~ SOLN
3.0000 [IU] | Freq: Three times a day (TID) | SUBCUTANEOUS | Status: DC
  Administered 2019-08-22: 3 [IU] via SUBCUTANEOUS

## 2019-08-21 MED ORDER — FAMOTIDINE 20 MG PO TABS
20.0000 mg | ORAL_TABLET | Freq: Two times a day (BID) | ORAL | Status: DC
  Administered 2019-08-21 – 2019-08-24 (×7): 20 mg via ORAL
  Filled 2019-08-21 (×9): qty 1

## 2019-08-21 MED ORDER — INSULIN ASPART 100 UNIT/ML ~~LOC~~ SOLN
0.0000 [IU] | Freq: Three times a day (TID) | SUBCUTANEOUS | Status: DC
  Administered 2019-08-22: 20 [IU] via SUBCUTANEOUS
  Administered 2019-08-22: 11 [IU] via SUBCUTANEOUS
  Administered 2019-08-22: 20 [IU] via SUBCUTANEOUS
  Administered 2019-08-23: 11 [IU] via SUBCUTANEOUS
  Administered 2019-08-23 (×2): 20 [IU] via SUBCUTANEOUS
  Administered 2019-08-24: 7 [IU] via SUBCUTANEOUS
  Administered 2019-08-24: 20 [IU] via SUBCUTANEOUS

## 2019-08-21 MED ORDER — INSULIN ASPART 100 UNIT/ML ~~LOC~~ SOLN
0.0000 [IU] | Freq: Every day | SUBCUTANEOUS | Status: DC
  Administered 2019-08-21: 5 [IU] via SUBCUTANEOUS
  Administered 2019-08-22 – 2019-08-23 (×2): 3 [IU] via SUBCUTANEOUS

## 2019-08-21 NOTE — ED Notes (Signed)
Meal tray provided to patient.

## 2019-08-21 NOTE — ED Notes (Signed)
ptar at bedside to transport pt to [facility]

## 2019-08-21 NOTE — Progress Notes (Signed)
Inpatient Diabetes Program Recommendations  AACE/ADA: New Consensus Statement on Inpatient Glycemic Control (2015)  Target Ranges:  Prepandial:   less than 140 mg/dL      Peak postprandial:   less than 180 mg/dL (1-2 hours)      Critically ill patients:  140 - 180 mg/dL   Lab Results  Component Value Date   GLUCAP 306 (H) 08/21/2019   HGBA1C 8.7 (H) 08/20/2019    Review of Glycemic Control Results for Kevin [name], Kevin DUANE "ELIYAHU STANFORTH" (MRN [id number]) as of 08/21/2019 12:42  Ref. Range 08/20/2019 12:34 08/20/2019 18:19 08/20/2019 22:16 08/21/2019 11:34  Glucose-Capillary Latest Ref Range: 70 - 99 mg/dL 304 (H) 208 (H) 198 (H) 306 (H)   Diabetes history: Type 2 DM Outpatient Diabetes medications: Lantus 35 units BID Current orders for Inpatient glycemic control: Lantus 20 units BID, Novolog 0-9 units TID, Novolog 0-5 units QHS Decadron 6 mg QD  Inpatient Diabetes Program Recommendations:    Consider:  -Increasing Lantus to 25 units BID -Adding Novolog 3 units TID (Assuming patient is consuming >50% of meal) -Increase correction to Novolog 0-20 units TID.   Thanks, Bronson Curb, MSN, RNC-OB Diabetes Coordinator [phone] (8a-5p)

## 2019-08-21 NOTE — ED Notes (Signed)
Breakfast tray ordered 

## 2019-08-21 NOTE — Progress Notes (Signed)
PROGRESS NOTE    Kevin [name]  LKG:[id number] DOB: [birth year] DOA: 08/19/2019 PCP: [name], MD   Brief Narrative:  Kevin [name] is Kevin [name] 78 y.o. male with medical history significant for history of CVA, paroxysmal atrial fibrillation on Eliquis, chronic kidney disease stage III, chronic diastolic CHF, ulcerative colitis, and insulin-dependent diabetes mellitus, now presenting to the emergency department for evaluation of cough, shortness of breath, fevers/chills, loose stools, and increased blood glucose.  Patient remarks that he was doing some yard work on 08/13/2019 when he began to develop some subjective fevers and chills.  He also noted some exertional dyspnea and fatigue that same day.  He went on to have Kevin [name] couple days of nonbloody diarrhea without abdominal pain, and has had progressive shortness of breath with cough, chills, fatigue, and increased blood sugar.  He denies chest pain, leg swelling, hemoptysis, abdominal pain, or vomiting.  ED Course: Upon arrival to the ED, patient is found to be afebrile, saturating mid 80s on room air, tachypneic up in the low 30s, and with stable blood pressure.  EKG with sinus rhythm, PVCs, and RBBB.  Chest x-ray features streaky bibasilar opacities.  Chemistry panel is notable for glucose of 172, BUN 34, and creatinine 1.79, similar to priors.  CBC is unremarkable.  COVID-19 antigen test is positive.  Procalcitonin undetectable.  Age-adjusted D-dimer negative.  CRP 3.8.  Blood cultures were collected in the emergency department and the patient was treated with albuterol.  Assessment & Plan:   Principal Problem:   Pneumonia due to COVID-19 virus Active Problems:   Essential hypertension   Cerebrovascular accident (CVA) ([place])   Paroxysmal atrial fibrillation (HCC)   Type 2 diabetes mellitus with vascular disease (HCC)   Chronic diastolic CHF (congestive heart failure) (HCC)   Chronic kidney disease, stage 3b   Acute respiratory  failure with hypoxia ([place])   H/O ulcerative colitis   1. COVID-19 pneumonia; acute hypoxic respiratory failure  - Presents with 7 days of progressive SOB, cough, fatigue, and chills  - Found to have COVID-19 with positive CXR findings, new 5 Lpm supplemental O2 requirement  - on 3 L this morning in the ED when I saw him, I was able to wean O2 to wean air briefly in the room and he was in the low 90's - CT chest obtained -> without acute intrathoracic pathology - emphysema with scattered areas of subpleural reticulation and mild bronchiectatic changes - no pneumonia on CT and ?O2 requirement, unclear sig of 5 L O2 requirement at presentation -> follow blood and urine cx, continue to w/u alternative causes for his symptoms - Blood cultures were collected in the ED and patient was treated with albuterol  - Continue steroids, remdesivir for now - discussed convalescent plasma risk/benefits.  He's agreeable to this if needed, would need to review and sign formal consent.  Readdress if needed. - I/O, daily weights - inflammatory labs - labs ordered, but not drawn today  COVID-19 Labs  Recent Labs    08/19/19 2150  DDIMER 0.62*  FERRITIN 138  LDH 174  CRP 3.8*    No results found for: SARSCOV2NAA  2. Paroxysmal atrial fibrillation  - In sinus rhythm on admission  - CHADS-VASc is 6 - 7(CVA, age, CHF, DM, HTN)  - Continue Eliquis, diltiazem, metoprolol    3. Chronic diastolic CHF  - Appears compensated  - Continue Lasix   4. Insulin-dependent DM  - A1c was 10.6% in March 2020  - A1c  8.7 now - Managed with Lantus at home  - He is being started on systemic steroid as above  - Increase lantus and start mealtime - resistant SSI - start tradjenta due to evidence of benefit in COVID 19 and DM  5. History of CVA  - Continue Eliquis and statin    6. CKD IIIb - SCr is 1.79 on admission, similar to priors  - Renally-dose medications, monitor   7. Ulcerative colitis  - Patient  reports remission since 1990's  - Continue mesalamine   R lung base subpleural thickening and nodularity - needs repeat CT in 3 months  Moderate Bilateral Hydro   Distended Urinary Bladder - urinalysis and cx pending.  Required I&Ox1.  Continue q6 bladder scan.  If needs additional I&O will need foley placed and outpatient urology follow up.  Continue flomax.  Follow renal function.     DVT prophylaxis: eliquis Code Status: full  Family Communication: none at bedside Disposition Plan: pending further improvement  Consultants:   none  Procedures:   none  Antimicrobials:  Anti-infectives (From admission, onward)   Start     Dose/Rate Route Frequency Ordered Stop   08/21/19 1000  remdesivir 100 mg in sodium chloride 0.9 % 250 mL IVPB     100 mg 500 mL/hr over 30 Minutes Intravenous Every 24 hours 08/20/19 0009 08/25/19 0959   08/20/19 0130  trimethoprim (TRIMPEX) tablet 100 mg     100 mg Oral Daily at bedtime 08/20/19 0010     08/20/19 0100  remdesivir 200 mg in sodium chloride 0.9 % 250 mL IVPB     200 mg 500 mL/hr over 30 Minutes Intravenous Once 08/20/19 0009 08/20/19 0224     Subjective: Feels ok.  Feeling better.  Objective: Vitals:   08/21/19 0800 08/21/19 0939 08/21/19 1600 08/21/19 2053  BP: 118/71 128/71 (!) 123/58 130/72  Pulse: 66 75 69   Resp: (!) 22 10 16 18   Temp:  97.6 F (36.4 C) 98.1 F (36.7 C) 97.6 F (36.4 C)  TempSrc:  Oral Oral Oral  SpO2: 94% 96% 94%   Weight:      Height:        Intake/Output Summary (Last 24 hours) at 08/21/2019 2119 Last data filed at 08/21/2019 1500 Gross per 24 hour  Intake 765 ml  Output 1551 ml  Net -786 ml   Filed Weights   08/19/19 1533  Weight: 101.2 kg    Examination:  General: No acute distress. Cardiovascular: RRR Lungs: unlabored Abdomen: Soft, nontender, nondistended  Neurological: Alert and oriented 3. Moves all extremities 4 . Cranial nerves II through XII grossly intact. Skin: Warm and dry.  No rashes or lesions. Extremities: L transmet amputation, no LEE     Data Reviewed: I have personally reviewed following labs and imaging studies  CBC: Recent Labs  Lab 08/19/19 2150  WBC 8.8  NEUTROABS 5.9  HGB 15.0  HCT 45.3  MCV 91.1  PLT 768   Basic Metabolic Panel: Recent Labs  Lab 08/19/19 2150  NA 132*  K 4.2  CL 100  CO2 22  GLUCOSE 172*  BUN 34*  CREATININE 1.79*  CALCIUM 8.8*   GFR: Estimated Creatinine Clearance: 44.6 mL/min (Jatavian Calica) (by C-G formula based on SCr of 1.79 mg/dL (H)). Liver Function Tests: Recent Labs  Lab 08/19/19 2150  AST 36  ALT 31  ALKPHOS 65  BILITOT 0.6  PROT 8.0  ALBUMIN 3.6   No results for input(s): LIPASE, AMYLASE in the  last 168 hours. No results for input(s): AMMONIA in the last 168 hours. Coagulation Profile: No results for input(s): INR, PROTIME in the last 168 hours. Cardiac Enzymes: No results for input(s): CKTOTAL, CKMB, CKMBINDEX, TROPONINI in the last 168 hours. BNP (last 3 results) No results for input(s): PROBNP in the last 8760 hours. HbA1C: Recent Labs    08/20/19 0904  HGBA1C 8.7*   CBG: Recent Labs  Lab 08/20/19 1819 08/20/19 2216 08/21/19 1134 08/21/19 1613 08/21/19 2043  GLUCAP 208* 198* 306* 378* 374*   Lipid Profile: Recent Labs    08/19/19 2150  TRIG 86   Thyroid Function Tests: No results for input(s): TSH, T4TOTAL, FREET4, T3FREE, THYROIDAB in the last 72 hours. Anemia Panel: Recent Labs    08/19/19 2150  FERRITIN 138   Sepsis Labs: Recent Labs  Lab 08/19/19 2150  PROCALCITON <0.10  LATICACIDVEN 1.3    Recent Results (from the past 240 hour(s))  Blood Culture (routine x 2)     Status: None (Preliminary result)   Collection Time: 08/19/19  9:50 PM   Specimen: BLOOD RIGHT ARM  Result Value Ref Range Status   Specimen Description BLOOD RIGHT ARM  Final   Special Requests   Final    BOTTLES DRAWN AEROBIC AND ANAEROBIC Blood Culture adequate volume   Culture   Final     NO GROWTH 2 DAYS Performed at [facility], [place] [address], [place], [place] [address]    Report Status PENDING  Incomplete  Blood Culture (routine x 2)     Status: None (Preliminary result)   Collection Time: 08/19/19  9:50 PM   Specimen: BLOOD RIGHT HAND  Result Value Ref Range Status   Specimen Description BLOOD RIGHT HAND  Final   Special Requests   Final    BOTTLES DRAWN AEROBIC AND ANAEROBIC Blood Culture adequate volume   Culture   Final    NO GROWTH 2 DAYS Performed at [facility], [place] [address], [place],  [address]    Report Status PENDING  Incomplete         Radiology Studies: Ct Chest Wo Contrast  Result Date: 08/20/2019 CLINICAL DATA:  78 year old male with fever and chills and shortness of breath. EXAM: CT CHEST WITHOUT CONTRAST TECHNIQUE: Multidetector CT imaging of the chest was performed following the standard protocol without IV contrast. COMPARISON:  Chest radiograph dated 08/20/2019 FINDINGS: Evaluation of this exam is limited in the absence of intravenous contrast. Cardiovascular: There is no cardiomegaly or pericardial effusion. There is multi vessel coronary vascular calcification. Advanced atherosclerotic calcification of the thoracic aorta. The central pulmonary arteries are grossly unremarkable. Mediastinum/Nodes: No hilar or mediastinal adenopathy. The esophagus is unremarkable. No mediastinal fluid collection. Lungs/Pleura: Moderate centrilobular emphysema. There is Hildred Pharo 7 mm left lower lobe subpleural nodule (series 8, image 88). Mild bronchiectatic changes. Scattered areas of subpleural reticulation. Right lung base subpleural thickening and nodularity measures 2.9 x 1.1 cm (series 3, image 63). This may represent an area of scarring however developing infiltrate or malignancy is not excluded. Follow-up recommended. There is no pleural effusion or pneumothorax. The central airways are patent. Upper Abdomen: Partially visualized left-sided  hydronephrosis. Musculoskeletal: Old L1 compression fracture with anterior wedging. No acute osseous pathology. IMPRESSION: 1. No acute intrathoracic pathology. 2. Emphysema with scattered areas of subpleural reticulation and mild bronchiectatic changes. 3. Right lung base subpleural thickening and nodularity as described. Follow-up with CT in 3 months recommended. 4. Partially visualized left-sided hydronephrosis. 5. Old  L1 compression fracture with anterior wedging. 6. Aortic Atherosclerosis (ICD10-I70.0) and Emphysema (ICD10-J43.9). Electronically Signed   By: Anner Crete M.D.   On: 08/20/2019 10:42   US Renal  Result Date: 08/20/2019 CLINICAL DATA:  Hydronephrosis EXAM: RENAL / URINARY TRACT ULTRASOUND COMPLETE COMPARISON:  CT chest dated August 20, 2019. FINDINGS: Right Kidney: Renal measurements: 11.1 x 5.4 x 6 cm = volume: 186 mL. There is moderate hydronephrosis. Left Kidney: Renal measurements: 12.1 x 5.7 x 3.2 cm = volume: 112 mL. Moderate left-sided hydronephrosis is noted. Bladder: The urinary bladder appears to be distended. The ureteral jets were not visualized. Other: None. IMPRESSION: 1. Moderate bilateral hydronephrosis. 2. Distended urinary bladder. The ureteral jets were not visualized. Electronically Signed   By: Constance Holster M.D.   On: 08/20/2019 15:10   Dg Chest Port 1 View  Result Date: 08/21/2019 CLINICAL DATA:  COVID-19 positive.  Hypoxia. EXAM: PORTABLE CHEST 1 VIEW COMPARISON:  CT 08/20/2019.  Chest x-ray 08/20/2019. FINDINGS: Mediastinum hilar structures normal. Heart size stable. Lung volumes with mild bibasilar atelectasis. No pleural effusion or pneumothorax. IMPRESSION: Low lung volumes with mild bibasilar atelectasis. Electronically Signed   By: Marcello Moores  Register   On: 08/21/2019 08:59   Dg Chest Port 1 View  Result Date: 08/20/2019 CLINICAL DATA:  Shortness of breath, positive COVID EXAM: PORTABLE CHEST 1 VIEW COMPARISON:  August 19, 2019 FINDINGS: The  left costophrenic angle is partially excluded. Increased density medial right lung base. Stable mild interstitial prominence. No significant pleural effusion. No pneumothorax. Normal heart size. IMPRESSION: Increased density medial right lung base probably reflecting atelectasis. Otherwise stable lung aeration. Electronically Signed   By: Macy Mis M.D.   On: 08/20/2019 08:25        Scheduled Meds:  albuterol  2 puff Inhalation Q6H   apixaban  5 mg Oral BID   dexamethasone (DECADRON) injection  6 mg Intravenous Q24H   diltiazem  120 mg Oral Daily   famotidine  20 mg Oral BID   FLUoxetine  20 mg Oral Daily   furosemide  20 mg Oral Daily   insulin aspart  0-5 Units Subcutaneous QHS   insulin aspart  0-9 Units Subcutaneous TID WC   insulin glargine  20 Units Subcutaneous BID   linagliptin  5 mg Oral Daily   mesalamine  1.2 g Oral BID AC   metoprolol succinate  100 mg Oral Daily   rosuvastatin  5 mg Oral q1800   sodium chloride flush  3 mL Intravenous Q12H   tamsulosin  0.4 mg Oral q12n4p   trimethoprim  100 mg Oral QHS   Continuous Infusions:  sodium chloride     remdesivir 100 mg in NS 250 mL 100 mg (08/21/19 1233)     LOS: 2 days    Time spent: over 30 min    Fayrene Helper, MD [facility] Pager AMION  If 7PM-7AM, please contact night-coverage [email or web address] Password [facility] 08/21/2019, 9:19 PM

## 2019-08-22 ENCOUNTER — Ambulatory Visit: Payer: Medicare Other | Admitting: Physical Medicine & Rehabilitation

## 2019-08-22 DIAGNOSIS — I10 Essential (primary) hypertension: Secondary | ICD-10-CM

## 2019-08-22 LAB — CBC WITH DIFFERENTIAL/PLATELET
Abs Immature Granulocytes: 0.05 10*3/uL (ref 0.00–0.07)
Basophils Absolute: 0 10*3/uL (ref 0.0–0.1)
Basophils Relative: 0 %
Eosinophils Absolute: 0 10*3/uL (ref 0.0–0.5)
Eosinophils Relative: 0 %
HCT: 42.8 % (ref 39.0–52.0)
Hemoglobin: 14.4 g/dL (ref 13.0–17.0)
Immature Granulocytes: 1 %
Lymphocytes Relative: 18 %
Lymphs Abs: 1.8 10*3/uL (ref 0.7–4.0)
MCH: 30.4 pg (ref 26.0–34.0)
MCHC: 33.6 g/dL (ref 30.0–36.0)
MCV: 90.5 fL (ref 80.0–100.0)
Monocytes Absolute: 1 10*3/uL (ref 0.1–1.0)
Monocytes Relative: 9 %
Neutro Abs: 7.3 10*3/uL (ref 1.7–7.7)
Neutrophils Relative %: 72 %
Platelets: 241 10*3/uL (ref 150–400)
RBC: 4.73 MIL/uL (ref 4.22–5.81)
RDW: 12.1 % (ref 11.5–15.5)
WBC: 10.2 10*3/uL (ref 4.0–10.5)
nRBC: 0 % (ref 0.0–0.2)

## 2019-08-22 LAB — GLUCOSE, CAPILLARY
Glucose-Capillary: 265 mg/dL — ABNORMAL HIGH (ref 70–99)
Glucose-Capillary: 491 mg/dL — ABNORMAL HIGH (ref 70–99)
Glucose-Capillary: 423 mg/dL — ABNORMAL HIGH (ref 70–99)
Glucose-Capillary: 295 mg/dL — ABNORMAL HIGH (ref 70–99)

## 2019-08-22 LAB — COMPREHENSIVE METABOLIC PANEL
ALT: 36 U/L (ref 0–44)
AST: 33 U/L (ref 15–41)
Albumin: 3.4 g/dL — ABNORMAL LOW (ref 3.5–5.0)
Alkaline Phosphatase: 71 U/L (ref 38–126)
Anion gap: 11 (ref 5–15)
BUN: 49 mg/dL — ABNORMAL HIGH (ref 8–23)
CO2: 22 mmol/L (ref 22–32)
Calcium: 8.6 mg/dL — ABNORMAL LOW (ref 8.9–10.3)
Chloride: 100 mmol/L (ref 98–111)
Creatinine, Ser: 1.73 mg/dL — ABNORMAL HIGH (ref 0.61–1.24)
GFR calc Af Amer: 44 mL/min — ABNORMAL LOW (ref >60)
GFR calc non Af Amer: 38 mL/min — ABNORMAL LOW (ref >60)
Glucose, Bld: 168 mg/dL — ABNORMAL HIGH (ref 70–99)
Potassium: 3.8 mmol/L (ref 3.5–5.1)
Sodium: 133 mmol/L — ABNORMAL LOW (ref 135–145)
Total Bilirubin: 0.7 mg/dL (ref 0.3–1.2)
Total Protein: 7.6 g/dL (ref 6.5–8.1)

## 2019-08-22 LAB — FERRITIN: Ferritin: 136 ng/mL (ref 24–336)

## 2019-08-22 LAB — MAGNESIUM: Magnesium: 2.1 mg/dL (ref 1.7–2.4)

## 2019-08-22 LAB — C-REACTIVE PROTEIN: CRP: 2.3 mg/dL — ABNORMAL HIGH (ref <1.0)

## 2019-08-22 LAB — D-DIMER, QUANTITATIVE: D-Dimer, Quant: 0.51 ug/mL-FEU — ABNORMAL HIGH (ref 0.00–0.50)

## 2019-08-22 LAB — PHOSPHORUS: Phosphorus: 3.6 mg/dL (ref 2.5–4.6)

## 2019-08-22 MED ORDER — INSULIN ASPART 100 UNIT/ML ~~LOC~~ SOLN
5.0000 [IU] | Freq: Three times a day (TID) | SUBCUTANEOUS | Status: DC
  Administered 2019-08-22 – 2019-08-23 (×3): 5 [IU] via SUBCUTANEOUS

## 2019-08-22 MED ORDER — INSULIN GLARGINE 100 UNIT/ML ~~LOC~~ SOLN
28.0000 [IU] | Freq: Two times a day (BID) | SUBCUTANEOUS | Status: DC
  Administered 2019-08-22 – 2019-08-23 (×2): 28 [IU] via SUBCUTANEOUS
  Filled 2019-08-22 (×3): qty 0.28

## 2019-08-22 NOTE — Progress Notes (Signed)
PROGRESS NOTE  Kevin [name] [id number] DOB: November 15, 1940 DOA: 08/19/2019 PCP: [name], MD   LOS: 3 days   Brief Narrative / Interim history: 78 year old male with history of CVA, paroxysmal A. fib on Eliquis, chronic kidney disease stage III, chronic diastolic CHF, UC, insulin-dependent diabetes mellitus came into the ED with cough, shortness of breath, fever, loose stools.  He also has been complaining of exertional dyspnea and fatigue.  Subjective / 24h Interval events: He is feeling much better this morning, his breathing is improved, denies any chest pain, denies any abdominal pain, no nausea or vomiting  Assessment & Plan: Principal Problem:   Pneumonia due to COVID-19 virus Active Problems:   Essential hypertension   Cerebrovascular accident (CVA) ([place])   Paroxysmal atrial fibrillation ([place])   Type 2 diabetes mellitus with vascular disease (HCC)   Chronic diastolic CHF (congestive heart failure) (HCC)   Chronic kidney disease, stage 3b   Acute respiratory failure with hypoxia ([place])   H/O ulcerative colitis   Principal Problem Acute Hypoxic Respiratory Failure due to Covid-19 Viral Illness -Patient initially requiring 5 L nasal cannula, he was weaned off and seems comfortable on room air this morning -Started on steroids, remdesivir with last dose 11/27 -Inflammatory labs improving   COVID-19 Labs  Recent Labs    08/19/19 2150 08/22/19 0025  DDIMER 0.62* 0.51*  FERRITIN 138 136  LDH 174  --   CRP 3.8* 2.3*    No results found for: SARSCOV2NAA  Active Problems IDDM -A1c 8.7, this is poorly controlled with hyperglycemia -Worsened in the setting of steroids as well as COVID-19 infection -Persistently hyperglycemic today, continue Lantus and increase scheduled mealtime to 5 units in addition to sliding scale  Paroxysmal A. fib -In sinus rhythm on admission, continue Eliquis, diltiazem, metoprolol  Chronic diastolic CHF -Appears compensated,  on Lasix  Chronic kidney disease stage IIIb -Baseline creatinine around 1.7, currently at baseline  Prior CVA -Continue Eliquis, statin  Right lung base subpleural thickening and nodularity -Rates repeat CT in 3 months as an outpatient  Moderate bilateral hydro-/distended urinary bladder -Continue bladder scan, continue Flomax  Scheduled Meds: . albuterol  2 puff Inhalation Q6H  . apixaban  5 mg Oral BID  . dexamethasone (DECADRON) injection  6 mg Intravenous Q24H  . diltiazem  120 mg Oral Daily  . famotidine  20 mg Oral BID  . FLUoxetine  20 mg Oral Daily  . furosemide  20 mg Oral Daily  . insulin aspart  0-20 Units Subcutaneous TID WC  . insulin aspart  0-5 Units Subcutaneous QHS  . insulin aspart  5 Units Subcutaneous TID WC  . insulin glargine  25 Units Subcutaneous BID  . linagliptin  5 mg Oral Daily  . mesalamine  1.2 g Oral BID AC  . metoprolol succinate  100 mg Oral Daily  . rosuvastatin  5 mg Oral q1800  . sodium chloride flush  3 mL Intravenous Q12H  . tamsulosin  0.4 mg Oral q12n4p  . trimethoprim  100 mg Oral QHS   Continuous Infusions: . sodium chloride    . remdesivir 100 mg in NS 250 mL 100 mg (08/22/19 1033)   PRN Meds:.sodium chloride, acetaminophen, HYDROcodone-acetaminophen, ondansetron **OR** ondansetron (ZOFRAN) IV, sodium chloride flush  DVT prophylaxis: Eliquis Code Status: Full code Family Communication: d/w patient  Disposition Plan: home potentially 24h  Consultants:  None   Procedures: none  Microbiology: none  Antimicrobials: None    Objective: Vitals:   08/21/19 1600  08/21/19 2053 08/22/19 0625 08/22/19 0800  BP: (!) 123/58 130/72 (!) 130/58 104/76  Pulse: 69  (!) 52 (!) 53  Resp: 16 18 17 18   Temp: 98.1 F (36.7 C) 97.6 F (36.4 C) 97.6 F (36.4 C) 97.7 F (36.5 C)  TempSrc: Oral Oral Oral Oral  SpO2: 94%  92% 91%  Weight:      Height:        Intake/Output Summary (Last 24 hours) at 08/22/2019 1140 Last data  filed at 08/22/2019 0800 Gross per 24 hour  Intake 733 ml  Output 1501 ml  Net -768 ml   Filed Weights   08/19/19 1533  Weight: 101.2 kg    Examination:  Constitutional: NAD Eyes: PERRL, lids and conjunctivae normal ENMT: Mucous membranes are moist.  Respiratory: clear to auscultation bilaterally, no wheezing, no crackles. Normal respiratory effort.  Cardiovascular: Regular rate and rhythm, no murmurs / rubs / gallops. No edema Abdomen: no tenderness. Bowel sounds positive.  Musculoskeletal: no clubbing / cyanosis. Skin: no rashes Neurologic: CN 2-12 grossly intact. Strength 5/5 in all 4.  Psychiatric: Normal judgment and insight. Alert and oriented x 3. Normal mood.    Data Reviewed: I have independently reviewed following labs and imaging studies   CBC: Recent Labs  Lab 08/19/19 2150 08/22/19 0025  WBC 8.8 10.2  NEUTROABS 5.9 7.3  HGB 15.0 14.4  HCT 45.3 42.8  MCV 91.1 90.5  PLT 219 [id number]   Basic Metabolic Panel: Recent Labs  Lab 08/19/19 2150 08/22/19 0025  NA 132* 133*  K 4.2 3.8  CL 100 100  CO2 22 22  GLUCOSE 172* 168*  BUN 34* 49*  CREATININE 1.79* 1.73*  CALCIUM 8.8* 8.6*  MG  --  2.1  PHOS  --  3.6   GFR: Estimated Creatinine Clearance: 46.1 mL/min (A) (by C-G formula based on SCr of 1.73 mg/dL (H)). Liver Function Tests: Recent Labs  Lab 08/19/19 2150 08/22/19 0025  AST 36 33  ALT 31 36  ALKPHOS 65 71  BILITOT 0.6 0.7  PROT 8.0 7.6  ALBUMIN 3.6 3.4*   No results for input(s): LIPASE, AMYLASE in the last 168 hours. No results for input(s): AMMONIA in the last 168 hours. Coagulation Profile: No results for input(s): INR, PROTIME in the last 168 hours. Cardiac Enzymes: No results for input(s): CKTOTAL, CKMB, CKMBINDEX, TROPONINI in the last 168 hours. BNP (last 3 results) No results for input(s): PROBNP in the last 8760 hours. HbA1C: Recent Labs    08/20/19 0904  HGBA1C 8.7*   CBG: Recent Labs  Lab 08/20/19 2216 08/21/19 1134  08/21/19 1613 08/21/19 2043 08/22/19 0733  GLUCAP 198* 306* 378* 374* 265*   Lipid Profile: Recent Labs    08/19/19 2150  TRIG 86   Thyroid Function Tests: No results for input(s): TSH, T4TOTAL, FREET4, T3FREE, THYROIDAB in the last 72 hours. Anemia Panel: Recent Labs    08/19/19 2150 08/22/19 0025  FERRITIN 138 136   Urine analysis:    Component Value Date/Time   COLORURINE YELLOW 08/20/2019 1527   APPEARANCEUR HAZY (A) 08/20/2019 1527   LABSPEC 1.014 08/20/2019 1527   PHURINE 5.0 08/20/2019 1527   GLUCOSEU >=500 (A) 08/20/2019 1527   HGBUR SMALL (A) 08/20/2019 1527   BILIRUBINUR NEGATIVE 08/20/2019 1527   KETONESUR 5 (A) 08/20/2019 1527   PROTEINUR NEGATIVE 08/20/2019 1527   NITRITE NEGATIVE 08/20/2019 1527   LEUKOCYTESUR LARGE (A) 08/20/2019 1527   Sepsis Labs: Invalid input(s): PROCALCITONIN, LACTICIDVEN  Recent Results (  from the past 240 hour(s))  Blood Culture (routine x 2)     Status: None (Preliminary result)   Collection Time: 08/19/19  9:50 PM   Specimen: BLOOD RIGHT ARM  Result Value Ref Range Status   Specimen Description BLOOD RIGHT ARM  Final   Special Requests   Final    BOTTLES DRAWN AEROBIC AND ANAEROBIC Blood Culture adequate volume   Culture   Final    NO GROWTH 3 DAYS Performed at [facility], [address] [place], [place] [address]    Report Status PENDING  Incomplete  Blood Culture (routine x 2)     Status: None (Preliminary result)   Collection Time: 08/19/19  9:50 PM   Specimen: BLOOD RIGHT HAND  Result Value Ref Range Status   Specimen Description BLOOD RIGHT HAND  Final   Special Requests   Final    BOTTLES DRAWN AEROBIC AND ANAEROBIC Blood Culture adequate volume   Culture   Final    NO GROWTH 3 DAYS Performed at [facility], [place] [address], [place], [place] [address]    Report Status PENDING  Incomplete      Radiology Studies: US Renal  Result Date: 08/20/2019 CLINICAL DATA:  Hydronephrosis EXAM: RENAL  / URINARY TRACT ULTRASOUND COMPLETE COMPARISON:  CT chest dated August 20, 2019. FINDINGS: Right Kidney: Renal measurements: 11.1 x 5.4 x 6 cm = volume: 186 mL. There is moderate hydronephrosis. Left Kidney: Renal measurements: 12.1 x 5.7 x 3.2 cm = volume: 112 mL. Moderate left-sided hydronephrosis is noted. Bladder: The urinary bladder appears to be distended. The ureteral jets were not visualized. Other: None. IMPRESSION: 1. Moderate bilateral hydronephrosis. 2. Distended urinary bladder. The ureteral jets were not visualized. Electronically Signed   By: Constance Holster M.D.   On: 08/20/2019 15:10   Dg Chest Port 1 View  Result Date: 08/21/2019 CLINICAL DATA:  COVID-19 positive.  Hypoxia. EXAM: PORTABLE CHEST 1 VIEW COMPARISON:  CT 08/20/2019.  Chest x-ray 08/20/2019. FINDINGS: Mediastinum hilar structures normal. Heart size stable. Lung volumes with mild bibasilar atelectasis. No pleural effusion or pneumothorax. IMPRESSION: Low lung volumes with mild bibasilar atelectasis. Electronically Signed   By: Marcello Moores  Register   On: 08/21/2019 08:59    Marzetta Board, MD, PhD [facility]  Contact via  [email or web address]  [place] P: [phone] F: [phone]

## 2019-08-22 NOTE — Progress Notes (Signed)
Pt had no significant changes overnight.  Voids in urinal overnight-total 932mL output. A/Ox4. No reports of pain. Bed alarm remains on, bed in low-locked position. Walker & x1 assist while ambulating.

## 2019-08-23 LAB — COMPREHENSIVE METABOLIC PANEL
ALT: 31 U/L (ref 0–44)
AST: 27 U/L (ref 15–41)
Albumin: 3.2 g/dL — ABNORMAL LOW (ref 3.5–5.0)
Alkaline Phosphatase: 69 U/L (ref 38–126)
Anion gap: 12 (ref 5–15)
BUN: 50 mg/dL — ABNORMAL HIGH (ref 8–23)
CO2: 18 mmol/L — ABNORMAL LOW (ref 22–32)
Calcium: 8.7 mg/dL — ABNORMAL LOW (ref 8.9–10.3)
Chloride: 105 mmol/L (ref 98–111)
Creatinine, Ser: 1.68 mg/dL — ABNORMAL HIGH (ref 0.61–1.24)
GFR calc Af Amer: 45 mL/min — ABNORMAL LOW (ref >60)
GFR calc non Af Amer: 39 mL/min — ABNORMAL LOW (ref >60)
Glucose, Bld: 166 mg/dL — ABNORMAL HIGH (ref 70–99)
Potassium: 4.8 mmol/L (ref 3.5–5.1)
Sodium: 135 mmol/L (ref 135–145)
Total Bilirubin: 0.6 mg/dL (ref 0.3–1.2)
Total Protein: 7.3 g/dL (ref 6.5–8.1)

## 2019-08-23 LAB — CBC WITH DIFFERENTIAL/PLATELET
Abs Immature Granulocytes: 0.08 10*3/uL — ABNORMAL HIGH (ref 0.00–0.07)
Basophils Absolute: 0 10*3/uL (ref 0.0–0.1)
Basophils Relative: 0 %
Eosinophils Absolute: 0 10*3/uL (ref 0.0–0.5)
Eosinophils Relative: 0 %
HCT: 43.5 % (ref 39.0–52.0)
Hemoglobin: 14.5 g/dL (ref 13.0–17.0)
Immature Granulocytes: 1 %
Lymphocytes Relative: 14 %
Lymphs Abs: 1.5 10*3/uL (ref 0.7–4.0)
MCH: 30.1 pg (ref 26.0–34.0)
MCHC: 33.3 g/dL (ref 30.0–36.0)
MCV: 90.2 fL (ref 80.0–100.0)
Monocytes Absolute: 0.7 10*3/uL (ref 0.1–1.0)
Monocytes Relative: 7 %
Neutro Abs: 8.5 10*3/uL — ABNORMAL HIGH (ref 1.7–7.7)
Neutrophils Relative %: 78 %
Platelets: 235 10*3/uL (ref 150–400)
RBC: 4.82 MIL/uL (ref 4.22–5.81)
RDW: 12.1 % (ref 11.5–15.5)
WBC: 10.7 10*3/uL — ABNORMAL HIGH (ref 4.0–10.5)
nRBC: 0 % (ref 0.0–0.2)

## 2019-08-23 LAB — C-REACTIVE PROTEIN: CRP: <0.8 mg/dL (ref <1.0)

## 2019-08-23 LAB — D-DIMER, QUANTITATIVE: D-Dimer, Quant: 0.36 ug/mL-FEU (ref 0.00–0.50)

## 2019-08-23 LAB — URINE CULTURE: Culture: >=100000 — AB

## 2019-08-23 LAB — GLUCOSE, CAPILLARY
Glucose-Capillary: 292 mg/dL — ABNORMAL HIGH (ref 70–99)
Glucose-Capillary: 575 mg/dL (ref 70–99)
Glucose-Capillary: 448 mg/dL — ABNORMAL HIGH (ref 70–99)

## 2019-08-23 LAB — FERRITIN: Ferritin: 135 ng/mL (ref 24–336)

## 2019-08-23 LAB — MAGNESIUM: Magnesium: 2.1 mg/dL (ref 1.7–2.4)

## 2019-08-23 LAB — PHOSPHORUS: Phosphorus: 3.1 mg/dL (ref 2.5–4.6)

## 2019-08-23 MED ORDER — INSULIN GLARGINE 100 UNIT/ML ~~LOC~~ SOLN: 28.0000 [IU] | Freq: Every day | SUBCUTANEOUS | Status: DC

## 2019-08-23 MED ORDER — INSULIN GLARGINE 100 UNIT/ML ~~LOC~~ SOLN
28.0000 [IU] | Freq: Every day | SUBCUTANEOUS | Status: DC
  Administered 2019-08-23: 28 [IU] via SUBCUTANEOUS
  Filled 2019-08-23: qty 0.28

## 2019-08-23 MED ORDER — INSULIN GLARGINE 100 UNIT/ML ~~LOC~~ SOLN: 38.0000 [IU] | Freq: Every day | SUBCUTANEOUS | Status: DC

## 2019-08-23 MED ORDER — INSULIN ASPART 100 UNIT/ML ~~LOC~~ SOLN
10.0000 [IU] | Freq: Three times a day (TID) | SUBCUTANEOUS | Status: DC
  Administered 2019-08-23 (×2): 10 [IU] via SUBCUTANEOUS

## 2019-08-23 MED ORDER — INSULIN GLARGINE 100 UNIT/ML ~~LOC~~ SOLN
34.0000 [IU] | Freq: Every day | SUBCUTANEOUS | Status: DC
  Filled 2019-08-23: qty 0.34

## 2019-08-23 NOTE — Progress Notes (Signed)
PROGRESS NOTE  Kevin [name] [id number] DOB: Jul 31, 1941 DOA: 08/19/2019 PCP: [name], MD   LOS: 4 days   Brief Narrative / Interim history: 78 year old male with history of CVA, paroxysmal A. fib on Eliquis, chronic kidney disease stage III, chronic diastolic CHF, UC, insulin-dependent diabetes mellitus came into the ED with cough, shortness of breath, fever, loose stools.  He also has been complaining of exertional dyspnea and fatigue.  Subjective / 24h Interval events: He is having more energy today.  Denies any significant shortness of breath at rest, he was fine on room air last night but did require go back on the oxygen  Assessment & Plan: Principal Problem:   Pneumonia due to COVID-19 virus Active Problems:   Essential hypertension   Cerebrovascular accident (CVA) ([place])   Paroxysmal atrial fibrillation ([place])   Type 2 diabetes mellitus with vascular disease ([place])   Chronic diastolic CHF (congestive heart failure) ([place])   Chronic kidney disease, stage 3b   Acute respiratory failure with hypoxia ([facility])   H/O ulcerative colitis   Principal Problem Acute Hypoxic Respiratory Failure due to Covid-19 Viral Illness -Patient initially requiring 5 L nasal cannula, currently weaned off on 1 L -Started on steroids, remdesivir with last dose 11/27 -Inflammatory labs improving   COVID-19 Labs  Recent Labs    08/22/19 0025 08/23/19 0110  DDIMER 0.51* 0.36  FERRITIN 136 135  CRP 2.3* <0.8    No results found for: SARSCOV2NAA  Active Problems IDDM -A1c 8.7, this is poorly controlled with hyperglycemia -Worsened in the setting of steroids as well as COVID-19 infection -Continue Lantus and sliding scale CBG (last 3)  Recent Labs    08/22/19 1529 08/22/19 2018 08/23/19 0715  GLUCAP 423* 295* 292*    Paroxysmal A. fib -In sinus rhythm on admission, continue Eliquis, diltiazem, metoprolol  Chronic diastolic CHF -Appears compensated, continue Lasix   Chronic kidney disease stage IIIb -Baseline creatinine around 1.7, currently at baseline  Prior CVA -Continue Eliquis, statin  Right lung base subpleural thickening and nodularity -[place] repeat CT in 3 months as an outpatient  Moderate bilateral hydro-/distended urinary bladder -Continue bladder scan, continue Flomax  Scheduled Meds: . albuterol  2 puff Inhalation Q6H  . apixaban  5 mg Oral BID  . dexamethasone (DECADRON) injection  6 mg Intravenous Q24H  . diltiazem  120 mg Oral Daily  . famotidine  20 mg Oral BID  . FLUoxetine  20 mg Oral Daily  . furosemide  20 mg Oral Daily  . insulin aspart  0-20 Units Subcutaneous TID WC  . insulin aspart  0-5 Units Subcutaneous QHS  . insulin aspart  5 Units Subcutaneous TID WC  . insulin glargine  28 Units Subcutaneous BID  . linagliptin  5 mg Oral Daily  . mesalamine  1.2 g Oral BID AC  . metoprolol succinate  100 mg Oral Daily  . rosuvastatin  5 mg Oral q1800  . sodium chloride flush  3 mL Intravenous Q12H  . tamsulosin  0.4 mg Oral q12n4p  . trimethoprim  100 mg Oral QHS   Continuous Infusions: . sodium chloride    . remdesivir 100 mg in NS 250 mL 100 mg (08/23/19 1020)   PRN Meds:.sodium chloride, acetaminophen, HYDROcodone-acetaminophen, ondansetron **OR** ondansetron (ZOFRAN) IV, sodium chloride flush  DVT prophylaxis: Eliquis Code Status: Full code Family Communication: d/w patient  Disposition Plan: home once finishes remdesivir if on room air  Consultants:  None   Procedures: none  Microbiology: none  Antimicrobials: None    Objective: Vitals:   08/22/19 1600 08/22/19 2000 08/23/19 0340 08/23/19 0700  BP: 125/69 120/67 (!) 123/58 137/60  Pulse:   65 (!) 50  Resp:   19 19  Temp: 97.8 F (36.6 C) 98 F (36.7 C) 98 F (36.7 C) 97.9 F (36.6 C)  TempSrc: Oral Oral Oral Oral  SpO2:   90% 90%  Weight:      Height:        Intake/Output Summary (Last 24 hours) at 08/23/2019 1120 Last data filed at  08/23/2019 0800 Gross per 24 hour  Intake 630 ml  Output 3400 ml  Net -2770 ml   Filed Weights   08/19/19 1533  Weight: 101.2 kg    Examination:  Constitutional: No distress Eyes: No scleral icterus ENMT: Moist mucous membranes Respiratory: Diminished at the bases but overall clear without wheezing or crackles Cardiovascular: Regular rate and rhythm, no murmurs, no peripheral edema Abdomen: Soft, NT, ND, positive bowel sounds Musculoskeletal: no clubbing / cyanosis. Skin: No rashes seen Neurologic: Equal strength, no focal deficits Psychiatric: Normal judgment and insight. Alert and oriented x 3. Normal mood.    Data Reviewed: I have independently reviewed following labs and imaging studies   CBC: Recent Labs  Lab 08/19/19 2150 08/22/19 0025 08/23/19 0110  WBC 8.8 10.2 10.7*  NEUTROABS 5.9 7.3 8.5*  HGB 15.0 14.4 14.5  HCT 45.3 42.8 43.5  MCV 91.1 90.5 90.2  PLT 219 241 [id number]   Basic Metabolic Panel: Recent Labs  Lab 08/19/19 2150 08/22/19 0025 08/23/19 0110  NA 132* 133* 135  K 4.2 3.8 4.8  CL 100 100 105  CO2 22 22 18*  GLUCOSE 172* 168* 166*  BUN 34* 49* 50*  CREATININE 1.79* 1.73* 1.68*  CALCIUM 8.8* 8.6* 8.7*  MG  --  2.1 2.1  PHOS  --  3.6 3.1   GFR: Estimated Creatinine Clearance: 47.5 mL/min (A) (by C-G formula based on SCr of 1.68 mg/dL (H)). Liver Function Tests: Recent Labs  Lab 08/19/19 2150 08/22/19 0025 08/23/19 0110  AST 36 33 27  ALT 31 36 31  ALKPHOS 65 71 69  BILITOT 0.6 0.7 0.6  PROT 8.0 7.6 7.3  ALBUMIN 3.6 3.4* 3.2*   No results for input(s): LIPASE, AMYLASE in the last 168 hours. No results for input(s): AMMONIA in the last 168 hours. Coagulation Profile: No results for input(s): INR, PROTIME in the last 168 hours. Cardiac Enzymes: No results for input(s): CKTOTAL, CKMB, CKMBINDEX, TROPONINI in the last 168 hours. BNP (last 3 results) No results for input(s): PROBNP in the last 8760 hours. HbA1C: No results for  input(s): HGBA1C in the last 72 hours. CBG: Recent Labs  Lab 08/22/19 0733 08/22/19 1111 08/22/19 1529 08/22/19 2018 08/23/19 0715  GLUCAP 265* 491* 423* 295* 292*   Lipid Profile: No results for input(s): CHOL, HDL, LDLCALC, TRIG, CHOLHDL, LDLDIRECT in the last 72 hours. Thyroid Function Tests: No results for input(s): TSH, T4TOTAL, FREET4, T3FREE, THYROIDAB in the last 72 hours. Anemia Panel: Recent Labs    08/22/19 0025 08/23/19 0110  FERRITIN 136 135   Urine analysis:    Component Value Date/Time   COLORURINE YELLOW 08/20/2019 1527   APPEARANCEUR HAZY (A) 08/20/2019 1527   LABSPEC 1.014 08/20/2019 1527   PHURINE 5.0 08/20/2019 1527   GLUCOSEU >=500 (A) 08/20/2019 1527   HGBUR SMALL (A) 08/20/2019 1527   BILIRUBINUR NEGATIVE 08/20/2019 1527   KETONESUR 5 (A) 08/20/2019 1527   PROTEINUR  NEGATIVE 08/20/2019 1527   NITRITE NEGATIVE 08/20/2019 1527   LEUKOCYTESUR LARGE (A) 08/20/2019 1527   Sepsis Labs: Invalid input(s): PROCALCITONIN, LACTICIDVEN  Recent Results (from the past 240 hour(s))  Blood Culture (routine x 2)     Status: None (Preliminary result)   Collection Time: 08/19/19  9:50 PM   Specimen: BLOOD RIGHT ARM  Result Value Ref Range Status   Specimen Description BLOOD RIGHT ARM  Final   Special Requests   Final    BOTTLES DRAWN AEROBIC AND ANAEROBIC Blood Culture adequate volume   Culture   Final    NO GROWTH 3 DAYS Performed at [facility], [address], [place], [place] [address]    Report Status PENDING  Incomplete  Blood Culture (routine x 2)     Status: None (Preliminary result)   Collection Time: 08/19/19  9:50 PM   Specimen: BLOOD RIGHT HAND  Result Value Ref Range Status   Specimen Description BLOOD RIGHT HAND  Final   Special Requests   Final    BOTTLES DRAWN AEROBIC AND ANAEROBIC Blood Culture adequate volume   Culture   Final    NO GROWTH 3 DAYS Performed at [facility], [place] [address], [place], [place] [address]     Report Status PENDING  Incomplete  Culture, Urine     Status: Abnormal   Collection Time: 08/20/19  3:27 PM   Specimen: Urine, Random  Result Value Ref Range Status   Specimen Description URINE, RANDOM  Final   Special Requests   Final    ADDED ON 08/21/19 Performed at [facility], [place] [address], [place], [place] [address]    Culture >=100,000 COLONIES/mL ENTEROCOCCUS FAECALIS (A)  Final   Report Status 08/23/2019 FINAL  Final   Organism ID, Bacteria ENTEROCOCCUS FAECALIS (A)  Final      Susceptibility   Enterococcus faecalis - MIC*    AMPICILLIN <=2 SENSITIVE Sensitive     LEVOFLOXACIN 2 SENSITIVE Sensitive     NITROFURANTOIN <=16 SENSITIVE Sensitive     VANCOMYCIN 2 SENSITIVE Sensitive     * >=100,000 COLONIES/mL ENTEROCOCCUS FAECALIS      Radiology Studies: No results found.  Marzetta Board, MD, PhD [facility]  Contact via  [email or web address]  [place] P: [phone] F: [phone]

## 2019-08-24 LAB — CULTURE, BLOOD (ROUTINE X 2)
Culture: NO GROWTH
Culture: NO GROWTH
Special Requests: ADEQUATE
Special Requests: ADEQUATE

## 2019-08-24 LAB — PHOSPHORUS: Phosphorus: 3.7 mg/dL (ref 2.5–4.6)

## 2019-08-24 LAB — CBC WITH DIFFERENTIAL/PLATELET
Abs Immature Granulocytes: 0.1 10*3/uL — ABNORMAL HIGH (ref 0.00–0.07)
Basophils Absolute: 0 10*3/uL (ref 0.0–0.1)
Basophils Relative: 0 %
Eosinophils Absolute: 0 10*3/uL (ref 0.0–0.5)
Eosinophils Relative: 0 %
HCT: 40.2 % (ref 39.0–52.0)
Hemoglobin: 13.6 g/dL (ref 13.0–17.0)
Immature Granulocytes: 1 %
Lymphocytes Relative: 17 %
Lymphs Abs: 2 10*3/uL (ref 0.7–4.0)
MCH: 30.2 pg (ref 26.0–34.0)
MCHC: 33.8 g/dL (ref 30.0–36.0)
MCV: 89.3 fL (ref 80.0–100.0)
Monocytes Absolute: 1.1 10*3/uL — ABNORMAL HIGH (ref 0.1–1.0)
Monocytes Relative: 10 %
Neutro Abs: 8.7 10*3/uL — ABNORMAL HIGH (ref 1.7–7.7)
Neutrophils Relative %: 72 %
Platelets: 275 10*3/uL (ref 150–400)
RBC: 4.5 MIL/uL (ref 4.22–5.81)
RDW: 12.1 % (ref 11.5–15.5)
WBC: 11.9 10*3/uL — ABNORMAL HIGH (ref 4.0–10.5)
nRBC: 0 % (ref 0.0–0.2)

## 2019-08-24 LAB — COMPREHENSIVE METABOLIC PANEL
ALT: 29 U/L (ref 0–44)
AST: 17 U/L (ref 15–41)
Albumin: 3.3 g/dL — ABNORMAL LOW (ref 3.5–5.0)
Alkaline Phosphatase: 67 U/L (ref 38–126)
Anion gap: 12 (ref 5–15)
BUN: 56 mg/dL — ABNORMAL HIGH (ref 8–23)
CO2: 23 mmol/L (ref 22–32)
Calcium: 8.3 mg/dL — ABNORMAL LOW (ref 8.9–10.3)
Chloride: 102 mmol/L (ref 98–111)
Creatinine, Ser: 1.58 mg/dL — ABNORMAL HIGH (ref 0.61–1.24)
GFR calc Af Amer: 49 mL/min — ABNORMAL LOW (ref >60)
GFR calc non Af Amer: 42 mL/min — ABNORMAL LOW (ref >60)
Glucose, Bld: 63 mg/dL — ABNORMAL LOW (ref 70–99)
Potassium: 3.6 mmol/L (ref 3.5–5.1)
Sodium: 137 mmol/L (ref 135–145)
Total Bilirubin: 0.8 mg/dL (ref 0.3–1.2)
Total Protein: 7 g/dL (ref 6.5–8.1)

## 2019-08-24 LAB — D-DIMER, QUANTITATIVE: D-Dimer, Quant: 0.35 ug/mL-FEU (ref 0.00–0.50)

## 2019-08-24 LAB — GLUCOSE, CAPILLARY
Glucose-Capillary: 300 mg/dL — ABNORMAL HIGH (ref 70–99)
Glucose-Capillary: 248 mg/dL — ABNORMAL HIGH (ref 70–99)
Glucose-Capillary: 425 mg/dL — ABNORMAL HIGH (ref 70–99)

## 2019-08-24 LAB — FERRITIN: Ferritin: 129 ng/mL (ref 24–336)

## 2019-08-24 LAB — C-REACTIVE PROTEIN: CRP: 0.8 mg/dL (ref <1.0)

## 2019-08-24 LAB — MAGNESIUM: Magnesium: 2.1 mg/dL (ref 1.7–2.4)

## 2019-08-24 MED ORDER — INSULIN GLARGINE 100 UNIT/ML ~~LOC~~ SOLN: 24.0000 [IU] | Freq: Every day | SUBCUTANEOUS | Status: DC

## 2019-08-24 MED ORDER — INSULIN GLARGINE 100 UNIT/ML ~~LOC~~ SOLN: 24.0000 [IU] | Freq: Two times a day (BID) | SUBCUTANEOUS | 1 refills | Status: DC

## 2019-08-24 MED ORDER — INSULIN ASPART 100 UNIT/ML ~~LOC~~ SOLN
6.0000 [IU] | Freq: Three times a day (TID) | SUBCUTANEOUS | Status: DC
  Administered 2019-08-24: 6 [IU] via SUBCUTANEOUS

## 2019-08-24 MED ORDER — INSULIN GLARGINE 100 UNIT/ML ~~LOC~~ SOLN
28.0000 [IU] | Freq: Every day | SUBCUTANEOUS | Status: DC
  Administered 2019-08-24: 28 [IU] via SUBCUTANEOUS
  Filled 2019-08-24: qty 0.28

## 2019-08-24 MED ORDER — PREDNISONE 20 MG PO TABS: 20.0000 mg | ORAL_TABLET | Freq: Every day | ORAL | 0 refills | Status: DC

## 2019-08-24 MED ORDER — PREDNISONE 20 MG PO TABS: 20.0000 mg | ORAL_TABLET | Freq: Every day | ORAL | Status: DC

## 2019-08-24 NOTE — Discharge Instructions (Signed)
Follow with Kevin Arabian, MD in 5-7 days  Please get a complete blood count and chemistry panel checked by your Primary MD at your next visit, and again as instructed by your Primary MD. Please get your medications reviewed and adjusted by your Primary MD.  Please request your Primary MD to go over all Hospital Tests and Procedure/Radiological results at the follow up, please get all Hospital records sent to your Prim MD by signing hospital release before you go home.  In some cases, there will be blood work, cultures and biopsy results pending at the time of your discharge. Please request that your primary care M.D. goes through all the records of your hospital data and follows up on these results.  If you had Pneumonia of Lung problems at the Hospital: Please get a 2 view Chest X ray done in 6-8 weeks after hospital discharge or sooner if instructed by your Primary MD.  If you have Congestive Heart Failure: Please call your Cardiologist or Primary MD anytime you have any of the following symptoms:  1) 3 pound weight gain in 24 hours or 5 pounds in 1 week  2) shortness of breath, with or without a dry hacking cough  3) swelling in the hands, feet or stomach  4) if you have to sleep on extra pillows at night in order to breathe  Follow cardiac low salt diet and 1.5 lit/day fluid restriction.  If you have diabetes Accuchecks 4 times/day, Once in AM empty stomach and then before each meal. Log in all results and show them to your primary doctor at your next visit. If any glucose reading is under 80 or above 300 call your primary MD immediately.  If you have Seizure/Convulsions/Epilepsy: Please do not drive, operate heavy machinery, participate in activities at heights or participate in high speed sports until you have seen by Primary MD or a Neurologist and advised to do so again. Per [facility] statutes, patients with seizures are not allowed to drive until they have been  seizure-free for six months.  Use caution when using heavy equipment or power tools. Avoid working on ladders or at heights. Take showers instead of baths. Ensure the water temperature is not too high on the home water heater. Do not go swimming alone. Do not lock yourself in a room alone (i.e. bathroom). When caring for infants or small children, sit down when holding, feeding, or changing them to minimize risk of injury to the child in the event you have a seizure. Maintain good sleep hygiene. Avoid alcohol.   If you had Gastrointestinal Bleeding: Please ask your Primary MD to check a complete blood count within one week of discharge or at your next visit. Your endoscopic/colonoscopic biopsies that are pending at the time of discharge, will also need to followed by your Primary MD.  Get Medicines reviewed and adjusted. Please take all your medications with you for your next visit with your Primary MD  Please request your Primary MD to go over all hospital tests and procedure/radiological results at the follow up, please ask your Primary MD to get all Hospital records sent to his/her office.  If you experience worsening of your admission symptoms, develop shortness of breath, life threatening emergency, suicidal or homicidal thoughts you must seek medical attention immediately by calling 911 or calling your MD immediately  if symptoms less severe.  You must read complete instructions/literature along with all the possible adverse reactions/side effects for all the Medicines you take  and that have been prescribed to you. Take any new Medicines after you have completely understood and accpet all the possible adverse reactions/side effects.   Do not drive or operate heavy machinery when taking Pain medications.   Do not take more than prescribed Pain, Sleep and Anxiety Medications  Special Instructions: If you have smoked or chewed Tobacco  in the last 2 yrs please stop smoking, stop any regular  Alcohol  and or any Recreational drug use.  Wear Seat belts while driving.  Please note You were cared for by a hospitalist during your hospital stay. If you have any questions about your discharge medications or the care you received while you were in the hospital after you are discharged, you can call the unit and asked to speak with the hospitalist on call if the hospitalist that took care of you is not available. Once you are discharged, your primary care physician will handle any further medical issues. Please note that NO REFILLS for any discharge medications will be authorized once you are discharged, as it is imperative that you return to your primary care physician (or establish a relationship with a primary care physician if you do not have one) for your aftercare needs so that they can reassess your need for medications and monitor your lab values.  You can reach the hospitalist office at phone [phone] or fax [phone]   If you do not have a primary care physician, you can call [phone] for a physician referral.  Activity: As tolerated with Full fall precautions use walker/cane & assistance as needed    Diet: diabetic  Disposition Home     COVID-19 COVID-19 is a respiratory infection that is caused by a virus called severe acute respiratory syndrome coronavirus 2 (SARS-CoV-2). The disease is also known as coronavirus disease or novel coronavirus. In some people, the virus may not cause any symptoms. In others, it may cause a serious infection. The infection can get worse quickly and can lead to complications, such as:  Pneumonia, or infection of the lungs.  Acute respiratory distress syndrome or ARDS. This is fluid build-up in the lungs.  Acute respiratory failure. This is a condition in which there is not enough oxygen passing from the lungs to the body.  Sepsis or septic shock. This is a serious bodily reaction to an infection.  Blood clotting  problems.  Secondary infections due to bacteria or fungus. The virus that causes COVID-19 is contagious. This means that it can spread from person to person through droplets from coughs and sneezes (respiratory secretions). What are the causes? This illness is caused by a virus. You may catch the virus by:  Breathing in droplets from an infected person's cough or sneeze.  Touching something, like a table or a doorknob, that was exposed to the virus (contaminated) and then touching your mouth, nose, or eyes. What increases the risk? Risk for infection You are more likely to be infected with this virus if you:  Live in or travel to an area with a COVID-19 outbreak.  Come in contact with a sick person who recently traveled to an area with a COVID-19 outbreak.  Provide care for or live with a person who is infected with COVID-19. Risk for serious illness You are more likely to become seriously ill from the virus if you:  Are 26 years of age or older.  Have a long-term disease that lowers your body's ability to fight infection (immunocompromised).  Live in a nursing home  or long-term care facility.  Have a long-term (chronic) disease such as: ? Chronic lung disease, including chronic obstructive pulmonary disease or asthma ? Heart disease. ? Diabetes. ? Chronic kidney disease. ? Liver disease.  Are obese. What are the signs or symptoms? Symptoms of this condition can range from mild to severe. Symptoms may appear any time from 2 to 14 days after being exposed to the virus. They include:  A fever.  A cough.  Difficulty breathing.  Chills.  Muscle pains.  A sore throat.  Loss of taste or smell. Some people may also have stomach problems, such as nausea, vomiting, or diarrhea. Other people may not have any symptoms of COVID-19. How is this diagnosed? This condition may be diagnosed based on:  Your signs and symptoms, especially if: ? You live in an area with a  COVID-19 outbreak. ? You recently traveled to or from an area where the virus is common. ? You provide care for or live with a person who was diagnosed with COVID-19.  A physical exam.  Lab tests, which may include: ? A nasal swab to take a sample of fluid from your nose. ? A throat swab to take a sample of fluid from your throat. ? A sample of mucus from your lungs (sputum). ? Blood tests.  Imaging tests, which may include, X-rays, CT scan, or ultrasound. How is this treated? At present, there is no medicine to treat COVID-19. Medicines that treat other diseases are being used on a trial basis to see if they are effective against COVID-19. Your health care provider will talk with you about ways to treat your symptoms. For most people, the infection is mild and can be managed at home with rest, fluids, and over-the-counter medicines. Treatment for a serious infection usually takes places in a hospital intensive care unit (ICU). It may include one or more of the following treatments. These treatments are given until your symptoms improve.  Receiving fluids and medicines through an IV.  Supplemental oxygen. Extra oxygen is given through a tube in the nose, a face mask, or a hood.  Positioning you to lie on your stomach (prone position). This makes it easier for oxygen to get into the lungs.  Continuous positive airway pressure (CPAP) or bi-level positive airway pressure (BPAP) machine. This treatment uses mild air pressure to keep the airways open. A tube that is connected to a motor delivers oxygen to the body.  Ventilator. This treatment moves air into and out of the lungs by using a tube that is placed in your windpipe.  Tracheostomy. This is a procedure to create a hole in the neck so that a breathing tube can be inserted.  Extracorporeal membrane oxygenation (ECMO). This procedure gives the lungs a chance to recover by taking over the functions of the heart and lungs. It supplies  oxygen to the body and removes carbon dioxide. Follow these instructions at home: Lifestyle  If you are sick, stay home except to get medical care. Your health care provider will tell you how long to stay home. Call your health care provider before you go for medical care.  Rest at home as told by your health care provider.  Do not use any products that contain nicotine or tobacco, such as cigarettes, e-cigarettes, and chewing tobacco. If you need help quitting, ask your health care provider.  Return to your normal activities as told by your health care provider. Ask your health care provider what activities are safe for  you. General instructions  Take over-the-counter and prescription medicines only as told by your health care provider.  Drink enough fluid to keep your urine pale yellow.  Keep all follow-up visits as told by your health care provider. This is important. How is this prevented?  There is no vaccine to help prevent COVID-19 infection. However, there are steps you can take to protect yourself and others from this virus. To protect yourself:   Do not travel to areas where COVID-19 is a risk. The areas where COVID-19 is reported change often. To identify high-risk areas and travel restrictions, check the CDC travel website: FatFares.com.br  If you live in, or must travel to, an area where COVID-19 is a risk, take precautions to avoid infection. ? Stay away from people who are sick. ? Wash your hands often with soap and water for 20 seconds. If soap and water are not available, use an alcohol-based hand sanitizer. ? Avoid touching your mouth, face, eyes, or nose. ? Avoid going out in public, follow guidance from your state and local health authorities. ? If you must go out in public, wear a cloth face covering or face mask. ? Disinfect objects and surfaces that are frequently touched every day. This may include:  Counters and tables.  Doorknobs and light  switches.  Sinks and faucets.  Electronics, such as phones, remote controls, keyboards, computers, and tablets. To protect others: If you have symptoms of COVID-19, take steps to prevent the virus from spreading to others.  If you think you have a COVID-19 infection, contact your health care provider right away. Tell your health care team that you think you may have a COVID-19 infection.  Stay home. Leave your house only to seek medical care. Do not use public transport.  Do not travel while you are sick.  Wash your hands often with soap and water for 20 seconds. If soap and water are not available, use alcohol-based hand sanitizer.  Stay away from other members of your household. Let healthy household members care for children and pets, if possible. If you have to care for children or pets, wash your hands often and wear a mask. If possible, stay in your own room, separate from others. Use a different bathroom.  Make sure that all people in your household wash their hands well and often.  Cough or sneeze into a tissue or your sleeve or elbow. Do not cough or sneeze into your hand or into the air.  Wear a cloth face covering or face mask. Where to find more information  Centers for Disease Control and Prevention: PurpleGadgets.be  World Health Organization: https://www.castaneda.info/ Contact a health care provider if:  You live in or have traveled to an area where COVID-19 is a risk and you have symptoms of the infection.  You have had contact with someone who has COVID-19 and you have symptoms of the infection. Get help right away if:  You have trouble breathing.  You have pain or pressure in your chest.  You have confusion.  You have bluish lips and fingernails.  You have difficulty waking from sleep.  You have symptoms that get worse. These symptoms may represent a serious problem that is an emergency. Do not wait to see if the  symptoms will go away. Get medical help right away. Call your local emergency services (911 in the [place] Do not drive yourself to the hospital. Let the emergency medical personnel know if you think you have COVID-19. Summary  COVID-19  is a respiratory infection that is caused by a virus. It is also known as coronavirus disease or novel coronavirus. It can cause serious infections, such as pneumonia, acute respiratory distress syndrome, acute respiratory failure, or sepsis.  The virus that causes COVID-19 is contagious. This means that it can spread from person to person through droplets from coughs and sneezes.  You are more likely to develop a serious illness if you are 79 years of age or older, have a weak immunity, live in a nursing home, or have chronic disease.  There is no medicine to treat COVID-19. Your health care provider will talk with you about ways to treat your symptoms.  Take steps to protect yourself and others from infection. Wash your hands often and disinfect objects and surfaces that are frequently touched every day. Stay away from people who are sick and wear a mask if you are sick. This information is not intended to replace advice given to you by your health care provider. Make sure you discuss any questions you have with your health care provider. Document Released: 10/19/2018 Document Revised: 02/08/2019 Document Reviewed: 10/19/2018 Elsevier Patient Education  2020 [employer]: How to Protect Yourself and Others Know how it spreads  There is currently no vaccine to prevent coronavirus disease 2019 (COVID-19).  The best way to prevent illness is to avoid being exposed to this virus.  The virus is thought to spread mainly from person-to-person. ? Between people who are in close contact with one another (within about 6 feet). ? Through respiratory droplets produced when an infected person coughs, sneezes or talks. ? These droplets can land in the mouths  or noses of people who are nearby or possibly be inhaled into the lungs. ? Some recent studies have suggested that COVID-19 may be spread by people who are not showing symptoms. Everyone should Clean your hands often  Wash your hands often with soap and water for at least 20 seconds especially after you have been in a public place, or after blowing your nose, coughing, or sneezing.  If soap and water are not readily available, use a hand sanitizer that contains at least 60% alcohol. Cover all surfaces of your hands and rub them together until they feel dry.  Avoid touching your eyes, nose, and mouth with unwashed hands. Avoid close contact  Stay home if you are sick.  Avoid close contact with people who are sick.  Put distance between yourself and other people. ? Remember that some people without symptoms may be able to spread virus. ? This is especially important for people who are at higher risk of getting very GainPain.com.cy Cover your mouth and nose with a cloth face cover when around others  You could spread COVID-19 to others even if you do not feel sick.  Everyone should wear a cloth face cover when they have to go out in public, for example to the grocery store or to pick up other necessities. ? Cloth face coverings should not be placed on young children under age 23, anyone who has trouble breathing, or is unconscious, incapacitated or otherwise unable to remove the mask without assistance.  The cloth face cover is meant to protect other people in case you are infected.  Do NOT use a facemask meant for a Dietitian.  Continue to keep about 6 feet between yourself and others. The cloth face cover is not a substitute for social distancing. Cover coughs and sneezes  If you are in  a private setting and do not have on your cloth face covering, remember to always cover your mouth and nose with a tissue  when you cough or sneeze or use the inside of your elbow.  Throw used tissues in the trash.  Immediately wash your hands with soap and water for at least 20 seconds. If soap and water are not readily available, clean your hands with a hand sanitizer that contains at least 60% alcohol. Clean and disinfect  Clean AND disinfect frequently touched surfaces daily. This includes tables, doorknobs, light switches, countertops, handles, desks, phones, keyboards, toilets, faucets, and sinks. RackRewards.fr  If surfaces are dirty, clean them: Use detergent or soap and water prior to disinfection.  Then, use a household disinfectant. You can see a list of EPA-registered household disinfectants here. [email or web address].com 01/30/2019 This information is not intended to replace advice given to you by your health care provider. Make sure you discuss any questions you have with your health care provider. Document Released: 01/09/2019 Document Revised: 02/07/2019 Document Reviewed: 01/09/2019 Elsevier Patient Education  2020 [employer].   COVID-19 Frequently Asked Questions COVID-19 (coronavirus disease) is an infection that is caused by a large family of viruses. Some viruses cause illness in people and others cause illness in animals like camels, cats, and bats. In some cases, the viruses that cause illness in animals can spread to humans. Where did the coronavirus come from? In December 2019, [place] told the [facility]) of several cases of lung disease (human respiratory illness). These cases were linked to an open seafood and livestock market in the city of [place]. The link to the seafood and livestock market suggests that the virus may have spread from animals to humans. However, since that first outbreak in December, the virus has also been shown to spread from person to person. What is the name of the disease and the  virus? Disease name Early on, this disease was called novel coronavirus. This is because scientists determined that the disease was caused by a new (novel) respiratory virus. The World Health Organization [facility]) has now named the disease COVID-19, or coronavirus disease. Virus name The virus that causes the disease is called severe acute respiratory syndrome coronavirus 2 (SARS-CoV-2). More information on disease and virus naming World Health Organization [facility]): www.who.int/emergencies/diseases/novel-coronavirus-2019/technical-guidance/naming-the-coronavirus-disease-(covid-2019)-and-the-virus-that-causes-it Who is at risk for complications from coronavirus disease? Some people may be at higher risk for complications from coronavirus disease. This includes older adults and people who have chronic diseases, such as heart disease, diabetes, and lung disease. If you are at higher risk for complications, take these extra precautions:  Avoid close contact with people who are sick or have a fever or cough. Stay at least 3-6 ft (1-2 m) away from them, if possible.  Wash your hands often with soap and water for at least 20 seconds.  Avoid touching your face, mouth, nose, or eyes.  Keep supplies on hand at home, such as food, medicine, and cleaning supplies.  Stay home as much as possible.  Avoid social gatherings and travel. How does coronavirus disease spread? The virus that causes coronavirus disease spreads easily from person to person (is contagious). There are also cases of community-spread disease. This means the disease has spread to:  People who have no known contact with other infected people.  People who have not traveled to areas where there are known cases. It appears to spread from one person to another through droplets from coughing or sneezing. Can I get the virus  from touching surfaces or objects? There is still a lot that we do not know about the virus that causes coronavirus  disease. Scientists are basing a lot of information on what they know about similar viruses, such as:  Viruses cannot generally survive on surfaces for long. They need a human body (host) to survive.  It is more likely that the virus is spread by close contact with people who are sick (direct contact), such as through: ? Shaking hands or hugging. ? Breathing in respiratory droplets that travel through the air. This can happen when an infected person coughs or sneezes on or near other people.  It is less likely that the virus is spread when a person touches a surface or object that has the virus on it (indirect contact). The virus may be able to enter the body if the person touches a surface or object and then touches his or her face, eyes, nose, or mouth. Can a person spread the virus without having symptoms of the disease? It may be possible for the virus to spread before a person has symptoms of the disease, but this is most likely not the main way the virus is spreading. It is more likely for the virus to spread by being in close contact with people who are sick and breathing in the respiratory droplets of a sick person's cough or sneeze. What are the symptoms of coronavirus disease? Symptoms vary from person to person and can range from mild to severe. Symptoms may include:  Fever.  Cough.  Tiredness, weakness, or fatigue.  Fast breathing or feeling short of breath. These symptoms can appear anywhere from 2 to 14 days after you have been exposed to the virus. If you develop symptoms, call your health care provider. People with severe symptoms may need hospital care. If I am exposed to the virus, how long does it take before symptoms start? Symptoms of coronavirus disease may appear anywhere from 2 to 14 days after a person has been exposed to the virus. If you develop symptoms, call your health care provider. Should I be tested for this virus? Your health care provider will decide  whether to test you based on your symptoms, history of exposure, and your risk factors. How does a health care provider test for this virus? Health care providers will collect samples to send for testing. Samples may include:  Taking a swab of fluid from the nose.  Taking fluid from the lungs by having you cough up mucus (sputum) into a sterile cup.  Taking a blood sample.  Taking a stool or urine sample. Is there a treatment or vaccine for this virus? Currently, there is no vaccine to prevent coronavirus disease. Also, there are no medicines like antibiotics or antivirals to treat the virus. A person who becomes sick is given supportive care, which means rest and fluids. A person may also relieve his or her symptoms by using over-the-counter medicines that treat sneezing, coughing, and runny nose. These are the same medicines that a person takes for the common cold. If you develop symptoms, call your health care provider. People with severe symptoms may need hospital care. What can I do to protect myself and my family from this virus?     You can protect yourself and your family by taking the same actions that you would take to prevent the spread of other viruses. Take the following actions:  Wash your hands often with soap and water for at least 20 seconds.  If soap and water are not available, use alcohol-based hand sanitizer.  Avoid touching your face, mouth, nose, or eyes.  Cough or sneeze into a tissue, sleeve, or elbow. Do not cough or sneeze into your hand or the air. ? If you cough or sneeze into a tissue, throw it away immediately and wash your hands.  Disinfect objects and surfaces that you frequently touch every day.  Avoid close contact with people who are sick or have a fever or cough. Stay at least 3-6 ft (1-2 m) away from them, if possible.  Stay home if you are sick, except to get medical care. Call your health care provider before you get medical care.  Make sure  your vaccines are up to date. Ask your health care provider what vaccines you need. What should I do if I need to travel? Follow travel recommendations from your local health authority, the CDC, and WHO. Travel information and advice  Centers for Disease Control and Prevention (CDC): BodyEditor.hu  World Health Organization [facility]): ThirdIncome.ca Know the risks and take action to protect your health  You are at higher risk of getting coronavirus disease if you are traveling to areas with an outbreak or if you are exposed to travelers from areas with an outbreak.  Wash your hands often and practice good hygiene to lower the risk of catching or spreading the virus. What should I do if I am sick? General instructions to stop the spread of infection  Wash your hands often with soap and water for at least 20 seconds. If soap and water are not available, use alcohol-based hand sanitizer.  Cough or sneeze into a tissue, sleeve, or elbow. Do not cough or sneeze into your hand or the air.  If you cough or sneeze into a tissue, throw it away immediately and wash your hands.  Stay home unless you must get medical care. Call your health care provider or local health authority before you get medical care.  Avoid public areas. Do not take public transportation, if possible.  If you can, wear a mask if you must go out of the house or if you are in close contact with someone who is not sick. Keep your home clean  Disinfect objects and surfaces that are frequently touched every day. This may include: ? Counters and tables. ? Doorknobs and light switches. ? Sinks and faucets. ? Electronics such as phones, remote controls, keyboards, computers, and tablets.  Wash dishes in hot, soapy water or use a dishwasher. Air-dry your dishes.  Wash laundry in hot water. Prevent infecting other household  members  Let healthy household members care for children and pets, if possible. If you have to care for children or pets, wash your hands often and wear a mask.  Sleep in a different bedroom or bed, if possible.  Do not share personal items, such as razors, toothbrushes, deodorant, combs, brushes, towels, and washcloths. Where to find more information Centers for Disease Control and Prevention (CDC)  Information and news updates: https://www.butler-gonzalez.com/ World Health Organization [facility])  Information and news updates: MissExecutive.com.ee  Coronavirus health topic: https://www.castaneda.info/  Questions and answers on COVID-19: OpportunityDebt.at  Global tracker: who.sprinklr.com American Academy of Pediatrics (AAP)  Information for families: www.healthychildren.org/English/health-issues/conditions/chest-lungs/Pages/2019-Novel-Coronavirus.aspx The coronavirus situation is changing rapidly. Check your local health authority website or the CDC and [facility] websites for updates and news. When should I contact a health care provider?  Contact your health care provider if you have symptoms of an infection, such as  fever or cough, and you: ? Have been near anyone who is known to have coronavirus disease. ? Have come into contact with a person who is suspected to have coronavirus disease. ? Have traveled outside of the country. When should I get emergency medical care?  Get help right away by calling your local emergency services (911 in the [place] if you have: ? Trouble breathing. ? Pain or pressure in your chest. ? Confusion. ? Blue-tinged lips and fingernails. ? Difficulty waking from sleep. ? Symptoms that get worse. Let the emergency medical personnel know if you think you have coronavirus disease. Summary  A new respiratory virus is spreading from person to person and causing COVID-19 (coronavirus  disease).  The virus that causes COVID-19 appears to spread easily. It spreads from one person to another through droplets from coughing or sneezing.  Older adults and those with chronic diseases are at higher risk of disease. If you are at higher risk for complications, take extra precautions.  There is currently no vaccine to prevent coronavirus disease. There are no medicines, such as antibiotics or antivirals, to treat the virus.  You can protect yourself and your family by washing your hands often, avoiding touching your face, and covering your coughs and sneezes. This information is not intended to replace advice given to you by your health care provider. Make sure you discuss any questions you have with your health care provider. Document Released: 01/09/2019 Document Revised: 01/09/2019 Document Reviewed: 01/09/2019 Elsevier Patient Education  2020 [place] who have recovered from the coronavirus, can help a person still fighting the virus and potentially help them recover by giving them convalescent plasma.  What is COVID-19 convalescent plasma? Convalescent plasma (CPP) is plasma collected from people who have recovered from the coronavirus. People who recover from coronavirus infection have developed antibodies to the virus that remain in the plasma portion of their blood. Transfusing the plasma that contains the antibodies into a person still fighting the virus can provide a boost to the patients immune system and potentially help them recover. The experimental treatment is approved by the FDA to be used on an emergency basis and is called COVID-19 convalescent plasma." Critically ill patients who meet the FDA criteria to receive this therapy can be treated for life-threatening COVID-19.  Can I donate convalescent plasma or blood after being diagnosed with COVID-19? Donors must meet all the required screening criteria for blood donation and the additional FDA criteria,  as follows:   Prior diagnosis of COVID-19 documented by an FDA approved laboratory test   A positive diagnostic test at the time of illness OR   A positive serological test for SARS-CoV-2 antibodies after recovery  Complete resolution of symptoms at least 14 days prior to donation and a documented negative COVID-19 FDA approved test OR   Complete resolution of symptoms at least 28 days prior to donation As a part of your pre-donation process you will be required to provide your COVID-19 test result(s).  Where can I donate COVID-19 convalescent plasma? You can complete the pre-donation form at [email or web address] and a donor specialist will be in contact within 24-48 hours.  What is the process for donating COVID-19 Convalescent Plasma?  1. The first step in the donation process is to obtain a copy of your test results or a letter from the testing facility notifying you of your positive result and the date it was taken 2. Complete the pre-donation form at [email or web address] 3. A representative will be  in contact within 24-48 hours to provide additional insight into the process 4. Once the process begins, [facility] will ensure you   Meet the required screening criteria for blood donation   Have the required test results   Meet the criteria for resolution of your symptoms   Complete resolution of symptoms at least 14 days prior to donation and a documented negative COVID-19 FDA approved test OR   Complete resolution of symptoms at least 28 days prior to donation 5. [facility] will then schedule a collection date and time to collect your COVID-19 convalescent plasma  Is the COVID-19 convalescent plasma donation safe? It is safe to donate COVID-19 convalescent plasma and done in the same way that thousands of blood donors donate blood products every day.  My family member or friend is being treated for COVID-19. Can I donate to help them if I am eligible? A directed donation will  need to be arranged in conjunction with your family members care provider.  The care provider will determine whether COVID-19 convalescent plasma is the best course of treatment   A directed donation must be arranged through the Clinician Referral page that can be found at [email or web address]   The clinician will need to provide [facility] with information about both you and the patient for which the donation is directed   Please note there are additional qualifications that are required such as your blood type and the patients blood type We encourage you to consider beginning the pre-registration process even if your loved one will not be receiving COVID-19 convalescent plasma as a part of their medical treatment. The need for COVID-19 convalescent plasma increases daily and your donated COVID-19 convalescent plasma could be life-saving for another COVID-19 patient.  Please go to the website [email or web address] if you would like to consider volunteer for plasma donation.

## 2019-08-24 NOTE — Plan of Care (Signed)
Pt able to maintain sats >88% while ambulating on RA; denies dizziness, SHOB, endorses felt good.  Spouse awaiting COVID test.  Pt states plan to wear masks and sleep in separate rooms.

## 2019-08-24 NOTE — Care Management Important Message (Signed)
Important Message  Patient Details  Name: Kevin [name] MRN: [id number] Date of Birth: 22-Feb-1941   Medicare Important Message Given:  Yes - Important Message mailed due to current National Emergency  Verbal consent obtained due to current National Emergency  Relationship to patient: Spouse/Significant Other Contact Name: Ceaser Garay Call Date: 08/24/19  Time: 1239 Phone: [phone] Outcome: Spoke with contact Important Message mailed to: Patient address on file    Delorse Lek 08/24/2019, 12:40 PM

## 2019-08-24 NOTE — Progress Notes (Signed)
MD advised:  Pt O2 sat 92-94% on room air.  Walked pt around the unit per MD request.  Maintained 89-92% O2 on room air.  Pt felt well, no c/o dizziness or SHOB.  I held Metoprolol this morning as his resting HR was upper 50's and he stated it's usually upper 60's mid 70's.    MD notified for CBG of 425.  Order was to give max ss dose of 20 + the 6units ordered w each meal d/t pt becoming hypoglycemic on 30u prior evening dinner stating should correct tomorrow as pt will no longer be on decadron.

## 2019-08-24 NOTE — Discharge Summary (Signed)
Physician Discharge Summary  Kevin [name] [id number] DOB: 1940/09/28 DOA: 08/19/2019  PCP: [name], MD  Admit date: 08/19/2019 Discharge date: 08/24/2019  Admitted From: home Disposition:  home  Recommendations for Outpatient Follow-up:  1. Follow up with PCP in 1-2 weeks  Home Health: none Equipment/Devices: none  Discharge Condition: stable CODE STATUS: Full code Diet recommendation: diabetic  HPI: Per admitting MD,  Kevin [name] is a 78 y.o. male with medical history significant for history of CVA, paroxysmal atrial fibrillation on Eliquis, chronic kidney disease stage III, chronic diastolic CHF, ulcerative colitis, and insulin-dependent diabetes mellitus, now presenting to the emergency department for evaluation of cough, shortness of breath, fevers/chills, loose stools, and increased blood glucose.  Patient remarks that he was doing some yard work on 08/13/2019 when he began to develop some subjective fevers and chills.  He also noted some exertional dyspnea and fatigue that same day.  He went on to have a couple days of nonbloody diarrhea without abdominal pain, and has had progressive shortness of breath with cough, chills, fatigue, and increased blood sugar.  He denies chest pain, leg swelling, hemoptysis, abdominal pain, or vomiting. ED Course: Upon arrival to the ED, patient is found to be afebrile, saturating mid 80s on room air, tachypneic up in the low 30s, and with stable blood pressure.  EKG with sinus rhythm, PVCs, and RBBB.  Chest x-ray features streaky bibasilar opacities.  Chemistry panel is notable for glucose of 172, BUN 34, and creatinine 1.79, similar to priors.  CBC is unremarkable.  COVID-19 antigen test is positive.  Procalcitonin undetectable.  Age-adjusted D-dimer negative.  CRP 3.8.  Blood cultures were collected in the emergency department and the patient was treated with albuterol.  Hospital Course / Discharge diagnoses: Principal  Problem  Acute Hypoxic Respiratory Failure due to Covid-19 Viral Illness -Patient admitted to the hospital with hypoxic respiratory failure requiring initially 5 L nasal cannula supplemental O2.  He was placed on remdesivir and finished a 5-day course while hospitalized.  He was also placed on steroids.  He improved clinically, has been weaned off to room air and on the day of discharge able to ambulate in the hallway without shortness of breath, chest pain or any other issues.  He will be discharged home in stable condition and will finish her steroid course as an outpatient.  His inflammatory labs have improved significantly  COVID-19 Labs  Recent Labs    08/22/19 0025 08/23/19 0110 08/24/19 0110 08/24/19 0115  DDIMER 0.51* 0.36  --  0.35  FERRITIN 136 135 129  --   CRP 2.3* <0.8 0.8  --    Active Problems IDDM -A1c 8.7, this is poorly controlled with hyperglycemia, worsened in the setting of steroids as well as COVID-19 infection.  He is to resume home regimen on discharge, further diabetes management as an outpatient per PCP Paroxysmal A. Fib -In sinus rhythm on admission, continue Eliquis, diltiazem, metoprolol Chronic diastolic CHF -Appears compensated, continue Lasix Chronic kidney disease stage IIIb -Baseline creatinine around 1.7, currently at baseline Prior CVA -Continue Eliquis, statin Right lung base subpleural thickening and nodularity -Rates repeat CT in 3 months as an outpatient  Discharge Instructions   Allergies as of 08/24/2019   No Known Allergies     Medication List    TAKE these medications   diltiazem 120 MG 24 hr capsule Commonly known as: Cardizem CD Take 1 capsule (120 mg total) by mouth daily.   Eliquis 5 MG Tabs  tablet Generic drug: apixaban TAKE 1 TABLET TWICE A DAY   FLUoxetine 20 MG capsule Commonly known as: PROZAC Take 20 mg by mouth daily.   furosemide 20 MG tablet Commonly known as: LASIX TAKE 1 TABLET BY MOUTH DAILY. YOU MAY TAKE AN  EXTRA TABLET FOR INCREASED SWELLING OR WEIGHT GAIN What changed: See the new instructions.   insulin glargine 100 UNIT/ML injection Commonly known as: LANTUS Inject 0.24 mLs (24 Units total) into the skin 2 (two) times daily. What changed: how much to take   mesalamine 0.375 g 24 hr capsule Commonly known as: APRISO Take 750 mg by mouth 2 (two) times daily before a meal.   metoprolol succinate 100 MG 24 hr tablet Commonly known as: TOPROL-XL Take 100 mg by mouth daily. Take with or immediately following a meal.   multivitamin with minerals Tabs tablet Take 1 tablet by mouth daily.   predniSONE 20 MG tablet Commonly known as: DELTASONE Take 1 tablet (20 mg total) by mouth daily with breakfast. Start taking on: August 25, 2019   rosuvastatin 10 MG tablet Commonly known as: CRESTOR Take 5 mg by mouth daily.   tamsulosin 0.4 MG Caps capsule Commonly known as: FLOMAX Take 1 capsule (0.4 mg total) by mouth 2 times daily at 12 noon and 4 pm.   trimethoprim 100 MG tablet Commonly known as: TRIMPEX Take 100 mg by mouth at bedtime.      Follow-up Information    [name], MD. Schedule an appointment as soon as possible for a visit in 2 week(s).   Specialty: Family Medicine Contact information: [address], [place] [address] [phone]        Burnell Blanks, MD .   Specialty: Cardiology Contact information: [address] [place] [address] [phone]           Consultations:  None   Procedures/Studies:  Ct Chest Wo Contrast  Result Date: 08/20/2019 CLINICAL DATA:  78 year old male with fever and chills and shortness of breath. EXAM: CT CHEST WITHOUT CONTRAST TECHNIQUE: Multidetector CT imaging of the chest was performed following the standard protocol without IV contrast. COMPARISON:  Chest radiograph dated 08/20/2019 FINDINGS: Evaluation of this exam is limited in the absence of intravenous contrast.  Cardiovascular: There is no cardiomegaly or pericardial effusion. There is multi vessel coronary vascular calcification. Advanced atherosclerotic calcification of the thoracic aorta. The central pulmonary arteries are grossly unremarkable. Mediastinum/Nodes: No hilar or mediastinal adenopathy. The esophagus is unremarkable. No mediastinal fluid collection. Lungs/Pleura: Moderate centrilobular emphysema. There is a 7 mm left lower lobe subpleural nodule (series 8, image 88). Mild bronchiectatic changes. Scattered areas of subpleural reticulation. Right lung base subpleural thickening and nodularity measures 2.9 x 1.1 cm (series 3, image 63). This may represent an area of scarring however developing infiltrate or malignancy is not excluded. Follow-up recommended. There is no pleural effusion or pneumothorax. The central airways are patent. Upper Abdomen: Partially visualized left-sided hydronephrosis. Musculoskeletal: Old L1 compression fracture with anterior wedging. No acute osseous pathology. IMPRESSION: 1. No acute intrathoracic pathology. 2. Emphysema with scattered areas of subpleural reticulation and mild bronchiectatic changes. 3. Right lung base subpleural thickening and nodularity as described. Follow-up with CT in 3 months recommended. 4. Partially visualized left-sided hydronephrosis. 5. Old L1 compression fracture with anterior wedging. 6. Aortic Atherosclerosis (ICD10-I70.0) and Emphysema (ICD10-J43.9). Electronically Signed   By: Anner Crete M.D.   On: 08/20/2019 10:42   US Renal  Result Date:  08/20/2019 CLINICAL DATA:  Hydronephrosis EXAM: RENAL / URINARY TRACT ULTRASOUND COMPLETE COMPARISON:  CT chest dated August 20, 2019. FINDINGS: Right Kidney: Renal measurements: 11.1 x 5.4 x 6 cm = volume: 186 mL. There is moderate hydronephrosis. Left Kidney: Renal measurements: 12.1 x 5.7 x 3.2 cm = volume: 112 mL. Moderate left-sided hydronephrosis is noted. Bladder: The urinary bladder appears to  be distended. The ureteral jets were not visualized. Other: None. IMPRESSION: 1. Moderate bilateral hydronephrosis. 2. Distended urinary bladder. The ureteral jets were not visualized. Electronically Signed   By: Constance Holster M.D.   On: 08/20/2019 15:10   Dg Chest Port 1 View  Result Date: 08/21/2019 CLINICAL DATA:  COVID-19 positive.  Hypoxia. EXAM: PORTABLE CHEST 1 VIEW COMPARISON:  CT 08/20/2019.  Chest x-ray 08/20/2019. FINDINGS: Mediastinum hilar structures normal. Heart size stable. Lung volumes with mild bibasilar atelectasis. No pleural effusion or pneumothorax. IMPRESSION: Low lung volumes with mild bibasilar atelectasis. Electronically Signed   By: Marcello Moores  Register   On: 08/21/2019 08:59   Dg Chest Port 1 View  Result Date: 08/20/2019 CLINICAL DATA:  Shortness of breath, positive COVID EXAM: PORTABLE CHEST 1 VIEW COMPARISON:  August 19, 2019 FINDINGS: The left costophrenic angle is partially excluded. Increased density medial right lung base. Stable mild interstitial prominence. No significant pleural effusion. No pneumothorax. Normal heart size. IMPRESSION: Increased density medial right lung base probably reflecting atelectasis. Otherwise stable lung aeration. Electronically Signed   By: Macy Mis M.D.   On: 08/20/2019 08:25   Dg Chest Portable 1 View  Result Date: 08/19/2019 CLINICAL DATA:  Productive cough, shortness of breath EXAM: PORTABLE CHEST 1 VIEW COMPARISON:  05/06/2018 FINDINGS: The heart size and mediastinal contours are within normal limits. Streaky bibasilar opacities. No large pleural fluid collection. No pneumothorax. IMPRESSION: Streaky bibasilar opacities which may reflect atelectasis versus atypical infection. Electronically Signed   By: Davina Poke M.D.   On: 08/19/2019 16:08      Subjective: - no chest pain, shortness of breath, no abdominal pain, nausea or vomiting.   Discharge Exam: BP 120/65 (BP Location: Right Arm)   Pulse (!) 52   Temp  (!) 97.4 F (36.3 C) (Oral)   Resp 16   Ht 6\' 1"  (1.854 m)   Wt 101.2 kg   SpO2 95%   BMI 29.42 kg/m   General: Pt is alert, awake, not in acute distress Cardiovascular: RRR, S1/S2 +, no rubs, no gallops Respiratory: CTA bilaterally, no wheezing, no rhonchi Abdominal: Soft, NT, ND, bowel sounds + Extremities: no edema, no cyanosis    The results of significant diagnostics from this hospitalization (including imaging, microbiology, ancillary and laboratory) are listed below for reference.     Microbiology: Recent Results (from the past 240 hour(s))  Blood Culture (routine x 2)     Status: None (Preliminary result)   Collection Time: 08/19/19  9:50 PM   Specimen: BLOOD RIGHT ARM  Result Value Ref Range Status   Specimen Description BLOOD RIGHT ARM  Final   Special Requests   Final    BOTTLES DRAWN AEROBIC AND ANAEROBIC Blood Culture adequate volume   Culture   Final    NO GROWTH 4 DAYS Performed at [facility], [address], [place], [place] [address]    Report Status PENDING  Incomplete  Blood Culture (routine x 2)     Status: None (Preliminary result)   Collection Time: 08/19/19  9:50 PM   Specimen: BLOOD RIGHT HAND  Result Value  Ref Range Status   Specimen Description BLOOD RIGHT HAND  Final   Special Requests   Final    BOTTLES DRAWN AEROBIC AND ANAEROBIC Blood Culture adequate volume   Culture   Final    NO GROWTH 4 DAYS Performed at [facility], [address], [place], [place] [address]    Report Status PENDING  Incomplete  Culture, Urine     Status: Abnormal   Collection Time: 08/20/19  3:27 PM   Specimen: Urine, Random  Result Value Ref Range Status   Specimen Description URINE, RANDOM  Final   Special Requests   Final    ADDED ON 08/21/19 Performed at [facility], [place] [address], [place], [place] [address]    Culture >=100,000 COLONIES/mL ENTEROCOCCUS FAECALIS (A)  Final   Report Status 08/23/2019 FINAL  Final   Organism ID,  Bacteria ENTEROCOCCUS FAECALIS (A)  Final      Susceptibility   Enterococcus faecalis - MIC*    AMPICILLIN <=2 SENSITIVE Sensitive     LEVOFLOXACIN 2 SENSITIVE Sensitive     NITROFURANTOIN <=16 SENSITIVE Sensitive     VANCOMYCIN 2 SENSITIVE Sensitive     * >=100,000 COLONIES/mL ENTEROCOCCUS FAECALIS     Labs: Basic Metabolic Panel: Recent Labs  Lab 08/19/19 2150 08/22/19 0025 08/23/19 0110 08/24/19 0115  NA 132* 133* 135 137  K 4.2 3.8 4.8 3.6  CL 100 100 105 102  CO2 22 22 18* 23  GLUCOSE 172* 168* 166* 63*  BUN 34* 49* 50* 56*  CREATININE 1.79* 1.73* 1.68* 1.58*  CALCIUM 8.8* 8.6* 8.7* 8.3*  MG  --  2.1 2.1 2.1  PHOS  --  3.6 3.1 3.7   Liver Function Tests: Recent Labs  Lab 08/19/19 2150 08/22/19 0025 08/23/19 0110 08/24/19 0115  AST 36 33 27 17  ALT 31 36 31 29  ALKPHOS 65 71 69 67  BILITOT 0.6 0.7 0.6 0.8  PROT 8.0 7.6 7.3 7.0  ALBUMIN 3.6 3.4* 3.2* 3.3*   CBC: Recent Labs  Lab 08/19/19 2150 08/22/19 0025 08/23/19 0110 08/24/19 0115  WBC 8.8 10.2 10.7* 11.9*  NEUTROABS 5.9 7.3 8.5* 8.7*  HGB 15.0 14.4 14.5 13.6  HCT 45.3 42.8 43.5 40.2  MCV 91.1 90.5 90.2 89.3  PLT 219 241 235 275   CBG: Recent Labs  Lab 08/22/19 2018 08/23/19 0715 08/23/19 1122 08/23/19 1740 08/24/19 0753  GLUCAP 295* 292* 575* 448* 248*   Hgb A1c No results for input(s): HGBA1C in the last 72 hours. Lipid Profile No results for input(s): CHOL, HDL, LDLCALC, TRIG, CHOLHDL, LDLDIRECT in the last 72 hours. Thyroid function studies No results for input(s): TSH, T4TOTAL, T3FREE, THYROIDAB in the last 72 hours.  Invalid input(s): FREET3 Urinalysis    Component Value Date/Time   COLORURINE YELLOW 08/20/2019 1527   APPEARANCEUR HAZY (A) 08/20/2019 1527   LABSPEC 1.014 08/20/2019 1527   PHURINE 5.0 08/20/2019 1527   GLUCOSEU >=500 (A) 08/20/2019 1527   HGBUR SMALL (A) 08/20/2019 1527   BILIRUBINUR NEGATIVE 08/20/2019 1527   KETONESUR 5 (A) 08/20/2019 1527   PROTEINUR  NEGATIVE 08/20/2019 1527   NITRITE NEGATIVE 08/20/2019 1527   LEUKOCYTESUR LARGE (A) 08/20/2019 1527    FURTHER DISCHARGE INSTRUCTIONS:   Get Medicines reviewed and adjusted: Please take all your medications with you for your next visit with your Primary MD   Laboratory/radiological data: Please request your Primary MD to go over all hospital tests and procedure/radiological results at the follow  up, please ask your Primary MD to get all Hospital records sent to his/her office.   In some cases, they will be blood work, cultures and biopsy results pending at the time of your discharge. Please request that your primary care M.D. goes through all the records of your hospital data and follows up on these results.   Also Note the following: If you experience worsening of your admission symptoms, develop shortness of breath, life threatening emergency, suicidal or homicidal thoughts you must seek medical attention immediately by calling 911 or calling your MD immediately  if symptoms less severe.   You must read complete instructions/literature along with all the possible adverse reactions/side effects for all the Medicines you take and that have been prescribed to you. Take any new Medicines after you have completely understood and accpet all the possible adverse reactions/side effects.    Do not drive when taking Pain medications or sleeping medications (Benzodaizepines)   Do not take more than prescribed Pain, Sleep and Anxiety Medications. It is not advisable to combine anxiety,sleep and pain medications without talking with your primary care practitioner   Special Instructions: If you have smoked or chewed Tobacco  in the last 2 yrs please stop smoking, stop any regular Alcohol  and or any Recreational drug use.   Wear Seat belts while driving.   Please note: You were cared for by a hospitalist during your hospital stay. Once you are discharged, your primary care physician will handle any  further medical issues. Please note that NO REFILLS for any discharge medications will be authorized once you are discharged, as it is imperative that you return to your primary care physician (or establish a relationship with a primary care physician if you do not have one) for your post hospital discharge needs so that they can reassess your need for medications and monitor your lab values.  Time coordinating discharge: 35 minutes  SIGNED:  Marzetta Board, MD, PhD 08/24/2019, 11:44 AM

## 2019-08-28 ENCOUNTER — Encounter: Payer: Medicare Other | Admitting: Physical Medicine & Rehabilitation

## 2019-09-20 ENCOUNTER — Other Ambulatory Visit: Payer: Self-pay | Admitting: Cardiovascular Disease

## 2019-09-26 ENCOUNTER — Other Ambulatory Visit: Payer: Self-pay | Admitting: Cardiovascular Disease

## 2019-10-04 ENCOUNTER — Other Ambulatory Visit: Payer: Self-pay

## 2019-10-04 ENCOUNTER — Encounter: Payer: Medicare Other | Attending: Physical Medicine & Rehabilitation | Admitting: Physical Medicine & Rehabilitation

## 2019-10-04 ENCOUNTER — Encounter: Payer: Self-pay | Admitting: Physical Medicine & Rehabilitation

## 2019-10-04 VITALS — BP 119/65 | HR 70 | Temp 97.9°F | Ht 73.0 in | Wt 237.0 lb

## 2019-10-04 DIAGNOSIS — Z89432 Acquired absence of left foot: Secondary | ICD-10-CM | POA: Insufficient documentation

## 2019-10-04 DIAGNOSIS — R5381 Other malaise: Secondary | ICD-10-CM | POA: Insufficient documentation

## 2019-10-04 NOTE — Progress Notes (Signed)
Subjective:    Patient ID: Kevin [name], male    DOB: 08-Oct-1940, 79 y.o.   MRN: [id number]  HPI Male with history of T2DM with neuropathic ulcer, ulcerative colitis, hypertension presents for follow-up after left transmetatarsal amputation.    Last clinic visit 08/18/2019.  Since that time, patient went to the hospital on multiple occasions, most recently in November for [place], notes reviewed.  Since that time, patient states he continues HEP. He recently purchased a treadmill. He is working on weight loss. He continues to follow up with Neurology.   Pain Inventory Average Pain 0 Pain Right Now 0 My pain is no pain  In the last 24 hours, has pain interfered with the following? General activity 0 Relation with others 0 Enjoyment of life 0 What TIME of day is your pain at its worst? no pain Sleep (in general) Good  Pain is worse with: no pain Pain improves with: no pain Relief from Meds: no pain  Mobility walk without assistance  Function retired  Neuro/Psych No problems in this area  Prior Studies Any changes since last visit?  no  Physicians involved in your care Any changes since last visit?  no   Family History  Problem Relation Age of Onset  . Hypertension Father    Social History   Socioeconomic History  . Marital status: Married    Spouse name: Not on file  . Number of children: Not on file  . Years of education: Not on file  . Highest education level: Bachelor's degree (e.g., BA, AB, BS)  Occupational History  . Not on file  Tobacco Use  . Smoking status: Former Smoker    Packs/day: 1.00    Types: Cigarettes    Start date: 1963    Quit date: 1985    Years since quitting: 36.0  . Smokeless tobacco: Never Used  Substance and Sexual Activity  . Alcohol use: Yes    Alcohol/week: 4.0 standard drinks    Types: 4 Cans of beer per week    Comment: social   . Drug use: Never  . Sexual activity: Not on file  Other Topics Concern  . Not on  file  Social History Narrative   Lives at home with his wife and two cats. Married for almost 45 years   Right handed   Caffeine: 1 cup every morning   Social Determinants of Health   Financial Resource Strain:   . Difficulty of Paying Living Expenses: Not on file  Food Insecurity:   . Worried About Charity fundraiser in the Last Year: Not on file  . Ran Out of Food in the Last Year: Not on file  Transportation Needs:   . Lack of Transportation (Medical): Not on file  . Lack of Transportation (Non-Medical): Not on file  Physical Activity:   . Days of Exercise per Week: Not on file  . Minutes of Exercise per Session: Not on file  Stress:   . Feeling of Stress : Not on file  Social Connections:   . Frequency of Communication with Friends and Family: Not on file  . Frequency of Social Gatherings with Friends and Family: Not on file  . Attends Religious Services: Not on file  . Active Member of Clubs or Organizations: Not on file  . Attends Archivist Meetings: Not on file  . Marital Status: Not on file   Past Surgical History:  Procedure Laterality Date  . AMPUTATION Left 03/24/2018  Procedure: Transmetatarsal Amputation Left Foot;  Surgeon: [name], MD;  Location: [facility];  Service: Orthopedics;  Laterality: Left;  . CARDIOVERSION N/A 05/05/2018   Procedure: CARDIOVERSION;  Surgeon: [name], MD;  Location[facility] ENDOSCOPY;  Service: Cardiovascular;  Laterality: N/A;  . COLONOSCOPY  12/2017   benign, remission from ulcerative colitis in 90s  . I & D EXTREMITY Left 03/17/2018   Procedure: IRRIGATION AND DEBRIDEMENT FOOT;  Surgeon: [name], MD;  Location: [facility];  Service: Orthopedics;  Laterality: Left;  . STUMP REVISION Left 05/10/2018   Procedure: REVISION LEFT TRANSMETATARSAL AMPUTATION;  Surgeon: [name], MD;  Location: [facility];  Service: Orthopedics;  Laterality: Left;   Past Medical History:  Diagnosis Date  . A-fib ([place]) 03/20/2018    . Acute blood loss anemia   . Benign essential HTN   . Benign prostatic hyperplasia with urinary retention   . Cerebrovascular accident (CVA) ([place])   . CKD (chronic kidney disease), stage III   . Diabetes ([place]) 03/20/2018  . Diabetes mellitus type 2 in nonobese (H[facility])   . Essential hypertension 03/20/2018  . Former tobacco use   . H/O ulcerative colitis 1993  . Hypertension   . Hypokalemia   . Leukocytosis   . New onset atrial fibrillation ([place])   . 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  [name] (paroxysmal atrial fibrillation) ([place])    a. dx 03/2018 in setting of stroke, osteomyelitis.  . Sepsis ([place]) 03/20/2018  . Stage 3 chronic kidney disease    pt/family unaware  . 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 Stroke ([place]) 03/2018  . Stroke-like episode ([place]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 03/16/2018  . Tachycardia 03/20/2018  . Wrist fracture    Left   BP 119/65   Pulse 70   Temp 97.9 F (36.6 C)   Ht 6' 1"  (1.854 m)   Wt 237 lb (107.5 kg)   SpO2 93%   BMI 31.27 kg/m   Opioid Risk Score:   Fall Risk Score:  `1  Depression screen PHQ 2/9  Depression screen PHQ 2/9 04/12/2018  Decreased Interest 0  Down, Depressed, Hopeless 0  PHQ - 2 Score 0  Altered sleeping 1  Tired, decreased energy 1  Change in appetite 0  Feeling bad or failure about yourself  0  Trouble concentrating 0  Moving slowly or fidgety/restless 0  Suicidal thoughts 0  PHQ-9 Score 2  Difficult doing work/chores Somewhat difficult    Review of Systems  Constitutional: Negative.   HENT: Negative.   Eyes: Negative.   Respiratory: Negative.   Cardiovascular: Negative.   Gastrointestinal: Negative.   Endocrine: Negative.   Genitourinary: Negative.   Musculoskeletal: Negative.   Skin: Negative.   Allergic/Immunologic: Negative.   Neurological: Negative.   Hematological: Negative.   Psychiatric/Behavioral: Negative.       Objective:   Physical Exam  Constitutional:  No distress . Vital signs reviewed. HENT: Normocephalic.  Atraumatic. Eyes: EOMI. No discharge. Cardiovascular: No JVD. Respiratory: Normal effort.  No stridor. GI: Non-distended. Skin: Warm and dry.  Intact. Psych: Normal mood.  Normal behavior. Musc: No edema in extremities.  No tenderness in extremities. Neurological: Alert Motor: Grossly 5/5 throughout    Assessment & Plan:  Male with history of T2DM with neuropathic ulcer, ulcerative colitis, hypertension presents for follow-up after left transmetatarsal amputation.    1. Deficits with mobility, transfers, self-care secondary to left transmetatarsal amputation.  Cont HEP              2. Stroke like episode s/p IV TPA/ Debility:              No repeat episodes

## 2019-10-08 DIAGNOSIS — N3000 Acute cystitis without hematuria: Secondary | ICD-10-CM | POA: Diagnosis not present

## 2019-10-09 ENCOUNTER — Other Ambulatory Visit: Payer: Self-pay | Admitting: Cardiovascular Disease

## 2019-10-09 NOTE — Telephone Encounter (Signed)
Prescription refill request for Eliquis received.  Last office visit: [place], 07/19/2019 Scr: 1.58, 08/24/2019 Age: 79 y.o. Weight: 107.5kg  Prescription refill sent.

## 2019-10-15 DIAGNOSIS — E1169 Type 2 diabetes mellitus with other specified complication: Secondary | ICD-10-CM | POA: Diagnosis not present

## 2019-11-27 DIAGNOSIS — E1169 Type 2 diabetes mellitus with other specified complication: Secondary | ICD-10-CM | POA: Diagnosis not present

## 2019-12-10 ENCOUNTER — Other Ambulatory Visit: Payer: Self-pay | Admitting: Family Medicine

## 2019-12-10 DIAGNOSIS — R9389 Abnormal findings on diagnostic imaging of other specified body structures: Secondary | ICD-10-CM

## 2019-12-24 ENCOUNTER — Ambulatory Visit
Admission: RE | Admit: 2019-12-24 | Discharge: 2019-12-24 | Disposition: A | Discharge status: Home / Self Care | Payer: Medicare Other | Source: Ambulatory Visit | Attending: Family Medicine | Admitting: Family Medicine

## 2019-12-24 DIAGNOSIS — J181 Lobar pneumonia, unspecified organism: Secondary | ICD-10-CM | POA: Diagnosis not present

## 2019-12-24 DIAGNOSIS — R9389 Abnormal findings on diagnostic imaging of other specified body structures: Secondary | ICD-10-CM

## 2019-12-24 DIAGNOSIS — I10 Essential (primary) hypertension: Secondary | ICD-10-CM | POA: Diagnosis not present

## 2019-12-27 ENCOUNTER — Ambulatory Visit: Payer: Medicare Other | Attending: Internal Medicine

## 2019-12-27 DIAGNOSIS — Z23 Encounter for immunization: Secondary | ICD-10-CM

## 2019-12-27 NOTE — Progress Notes (Signed)
   Covid-19 Vaccination Clinic  Name:  Kevin [name]    MRN: [id number] DOB: [birth year]  12/27/2019  Mr. Snelling was observed post Covid-19 immunization for 15 minutes without incident. He was provided with Vaccine Information Sheet and instruction to access the V-Safe system.   Mr. Melland was instructed to call 911 with any severe reactions post vaccine: Marland Kitchen Difficulty breathing  . Swelling of face and throat  . A fast heartbeat  . A bad rash all over body  . Dizziness and weakness   Immunizations Administered    Name Date Dose VIS Date Route   Pfizer COVID-19 Vaccine 12/27/2019  2:14 PM 0.3 mL 09/07/2019 Intramuscular   Manufacturer: [employer], [employer]   Lot: [id number]   [place]: [id number]

## 2020-01-21 ENCOUNTER — Ambulatory Visit: Payer: Medicare Other | Attending: Internal Medicine

## 2020-01-21 DIAGNOSIS — Z23 Encounter for immunization: Secondary | ICD-10-CM

## 2020-01-21 NOTE — Progress Notes (Signed)
   Covid-19 Vaccination Clinic  Name:  Kevin [name]    MRN: [id number] DOB: 07/28/1941  01/21/2020  [name] was observed post Covid-19 immunization for 15 minutes without incident. He was provided with Vaccine Information Sheet and instruction to access the V-Safe system.   [name] was instructed to call 911 with any severe reactions post vaccine: Marland Kitchen Difficulty breathing  . Swelling of face and throat  . A fast heartbeat  . A bad rash all over body  . Dizziness and weakness   Immunizations Administered    Name Date Dose VIS Date Route   Pfizer COVID-19 Vaccine 01/21/2020  2:02 PM 0.3 mL 11/21/2018 Intramuscular   Manufacturer: [name]   Lot: [id number]   [place]: [id number]

## 2020-01-30 DIAGNOSIS — N130 Hydronephrosis with ureteropelvic junction obstruction: Secondary | ICD-10-CM | POA: Diagnosis not present

## 2020-01-30 DIAGNOSIS — R3914 Feeling of incomplete bladder emptying: Secondary | ICD-10-CM | POA: Diagnosis not present

## 2020-03-05 ENCOUNTER — Telehealth: Payer: Self-pay

## 2020-03-05 DIAGNOSIS — F322 Major depressive disorder, single episode, severe without psychotic features: Secondary | ICD-10-CM | POA: Diagnosis not present

## 2020-03-05 DIAGNOSIS — I700 Atherosclerosis of aorta: Secondary | ICD-10-CM | POA: Diagnosis not present

## 2020-03-05 DIAGNOSIS — I4819 Other persistent atrial fibrillation: Secondary | ICD-10-CM | POA: Diagnosis not present

## 2020-03-05 DIAGNOSIS — D6869 Other thrombophilia: Secondary | ICD-10-CM | POA: Diagnosis not present

## 2020-03-05 DIAGNOSIS — Z89432 Acquired absence of left foot: Secondary | ICD-10-CM | POA: Diagnosis not present

## 2020-03-05 DIAGNOSIS — E1169 Type 2 diabetes mellitus with other specified complication: Secondary | ICD-10-CM | POA: Diagnosis not present

## 2020-03-05 DIAGNOSIS — E7800 Pure hypercholesterolemia, unspecified: Secondary | ICD-10-CM | POA: Diagnosis not present

## 2020-03-05 DIAGNOSIS — I509 Heart failure, unspecified: Secondary | ICD-10-CM | POA: Diagnosis not present

## 2020-03-05 DIAGNOSIS — I10 Essential (primary) hypertension: Secondary | ICD-10-CM | POA: Diagnosis not present

## 2020-03-05 NOTE — Telephone Encounter (Signed)
Left message for the pt to please call our office as he will need to schedule a pre op clearance appt with Dr. [name] or APP.

## 2020-03-05 NOTE — Telephone Encounter (Signed)
Patient with diagnosis of afib on Eliquis for anticoagulation.    Procedure: colonoscopy Date of procedure: 04/10/20  CHADS2-VASc score of 7 (age x2, CHF, HTN, DM, CVA)  CrCl 21m/min using adjusted body weight Platelet count 275K  Due to elevated cardiac risk including prior stroke, recommend that pt only hold Eliquis for 1 day prior to procedure.

## 2020-03-05 NOTE — Telephone Encounter (Signed)
   [facility] Pre-operative Risk Assessment    HEARTCARE STAFF: - Please ensure there is not already an duplicate clearance open for this procedure. - Under Visit Info/Reason for Call, type in Other and utilize the format Clearance MM/DD/YY or Clearance TBD. Do not use dashes or single digits. - If request is for dental extraction, please clarify the # of teeth to be extracted.  Request for surgical clearance:  1. What type of surgery is being performed? Colonoscopy    2. When is this surgery scheduled? 04/10/20   3. What type of clearance is required (medical clearance vs. Pharmacy clearance to hold med vs. Both)?  Both   4. Are there any medications that need to be held prior to surgery and how long? Eliquis    5. Practice name and name of physician performing surgery? [facility], Dr. Therisa Doyne    6. What is the office phone number? [phone]   7.   What is the office fax number? [phone]  8.   Anesthesia type (None, local, MAC, general) ? None    Mendel Ryder 03/05/2020, 9:35 AM  _________________________________________________________________   (provider comments below)

## 2020-03-05 NOTE — Telephone Encounter (Signed)
   Primary Cardiologist:Christopher [name], MD  Chart reviewed as part of pre-operative protocol coverage. Patient last seen in 06/2019. Therefore, per office protocol, he will require a follow-up visit in order to better assess preoperative cardiovascular risk.  Pre-op covering staff: - Please schedule appointment and call patient to inform them. If patient already had an upcoming appointment within acceptable timeframe, please add "pre-op clearance" to the appointment notes so provider is aware. - Please contact requesting surgeon's office via preferred method (i.e, phone, fax) to inform them of need for appointment prior to surgery.  Please see pharmacy's recommendation below for holding Eliquis.  Darreld Mclean, PA-C  03/05/2020, 2:52 PM

## 2020-03-05 NOTE — Telephone Encounter (Signed)
Pharmacy, can you please comment on how long patient can hold Eliquis for upcoming procedure?  Thank you!

## 2020-03-07 NOTE — Telephone Encounter (Signed)
[id number] APPT

## 2020-03-11 NOTE — Telephone Encounter (Signed)
Pt called back and made an appt 04/09/2020  8:15 AM OFFICE VISIT Imogene Burn, PA-C

## 2020-04-03 IMAGING — DX DG CHEST 1V PORT
1 series · 1 of 1 positions shown · non-contrast
Comparison: August 19, 2019

CLINICAL DATA: Shortness of breath, positive COVID

EXAM:
PORTABLE CHEST 1 VIEW

[chest ap]
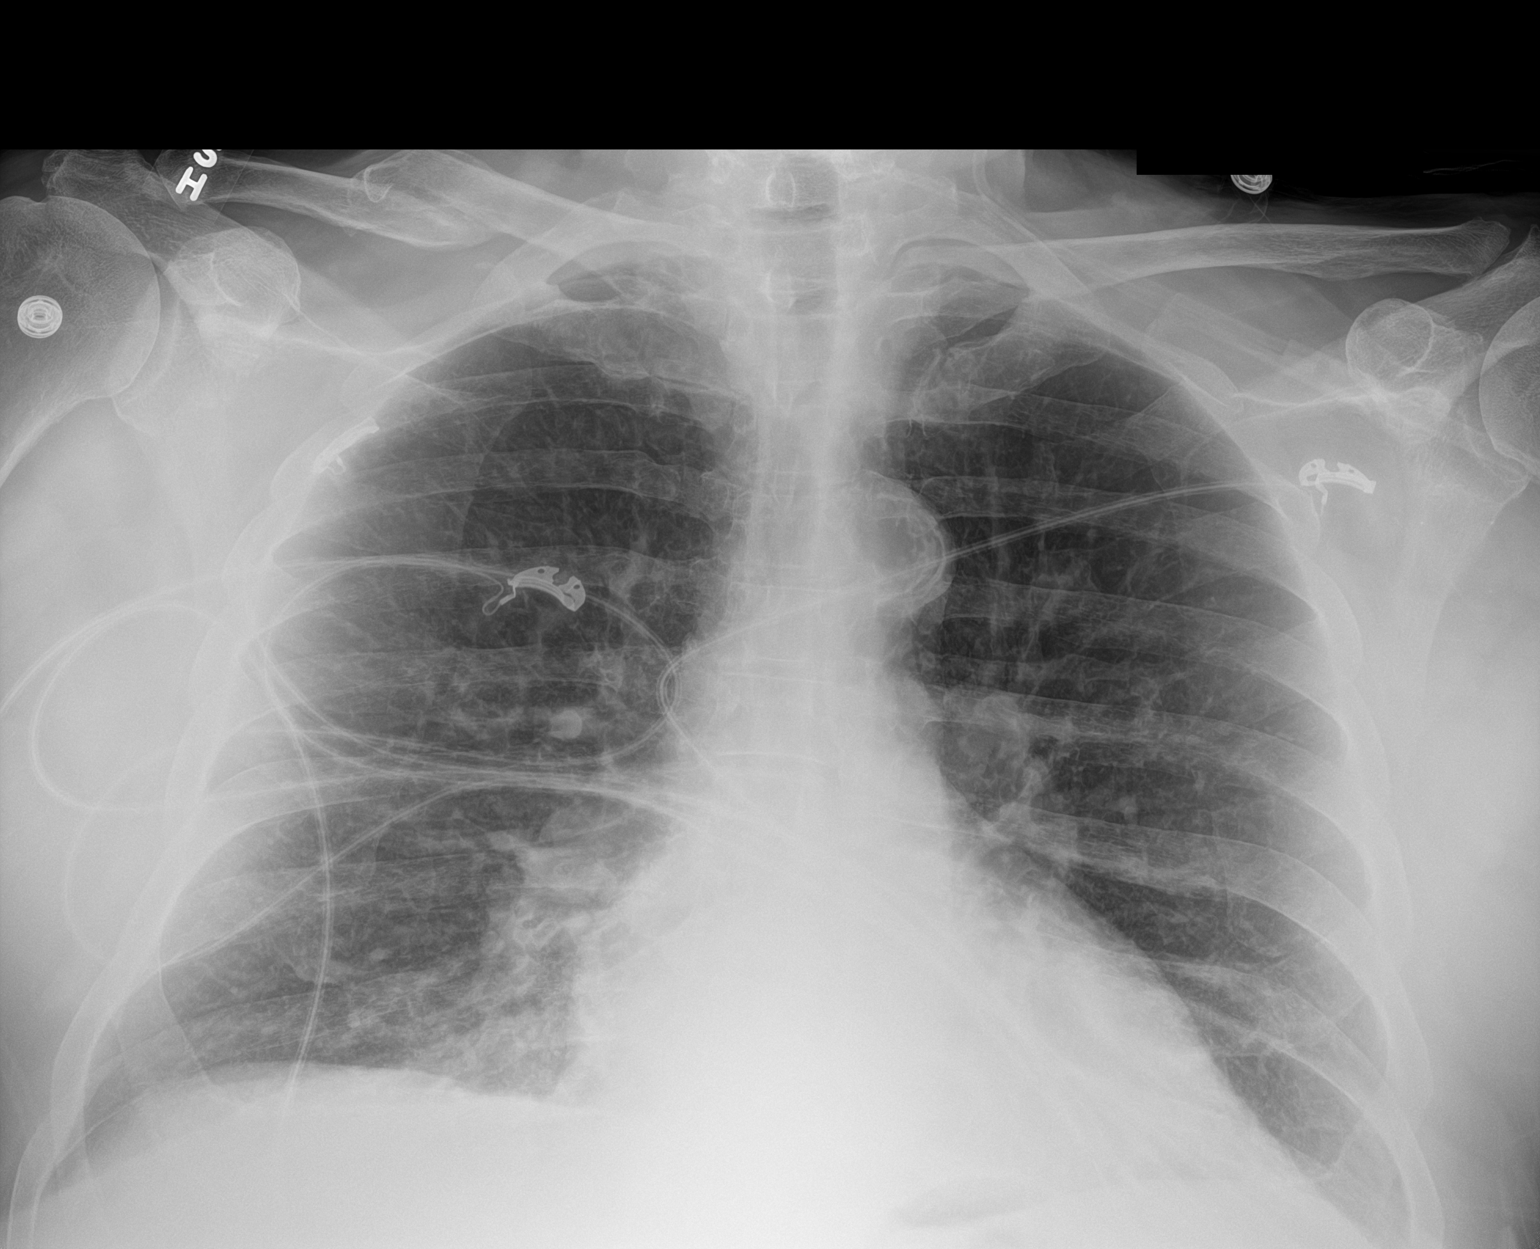

[1 of 1 positions shown; findings below may reference images not displayed]

FINDINGS: The left costophrenic angle is partially excluded. Increased density
medial right lung base. Stable mild interstitial prominence. No
significant pleural effusion. No pneumothorax. Normal heart size.
IMPRESSION: Increased density medial right lung base probably reflecting
atelectasis. Otherwise stable lung aeration.

## 2020-04-06 ENCOUNTER — Other Ambulatory Visit: Payer: Self-pay | Admitting: Cardiovascular Disease

## 2020-04-07 NOTE — Progress Notes (Signed)
Cardiology Office Note    Date:  04/09/2020   ID:  Kevin [name], DOB 1941-09-21, MRN [id number]  PCP:  Kevin Arabian, MD  Cardiologist: Kevin Chandler, MD EPS: None  No chief complaint on file.   History of Present Illness:  Kevin [name] is a 79 y.o. male with history of hypertension, DM, CVA, persistent atrial fibrillation on Eliquis for CHA2DS2-VASc score of 5, CKD stage III, chronic diastolic CHF, RBBB.  Patient had a CVA treated with TPA 02/2018 and was found to have new onset atrial fibrillation status post DCCV 05/05/2018.  2D echo 03/17/2018 normal LVEF 60 to 65% with moderate left atrial enlargement.  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    Patient last saw Dr. Angelena [name] 07/19/2019 at which time his weight was up 18 pounds but there was no evidence of fluid overload and no changes made.  Patient comes in today for clearance for colonoscopy tomorrow. He has been getting more short of breath with walking 30 feet but walks on the treadmill 30 min daily without a problem. Gets short of breath when he bends over to tie his shoes.Does yard work but has to sit down after 10 min because he's short of breath. Doesn't have chest pain, palpitations, dizziness or presyncope. Going to [place] in Sept and wants to make sure he's ok. No extra salt in diet.No edema. Former smoker and emphysema on CT. Hasn't seen pulmonary before.   Past Medical History:  Diagnosis Date  . A-fib ([place]) 03/20/2018  . Acute blood loss anemia   . Benign essential HTN   . Benign prostatic hyperplasia with urinary retention   . Cerebrovascular accident (CVA) ([place])   . CKD (chronic kidney disease), stage III   . Diabetes ([place]) 03/20/2018  . Diabetes mellitus type 2 in nonobese (HCC)   . Essential hypertension 03/20/2018  . Former tobacco use   . H/O ulcerative colitis 1993  . Hypertension   . Hypokalemia   . Leukocytosis   . New onset atrial  fibrillation ([place])   . Osteomyelitis ([place])    a. 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  Kevin [name] PAF (paroxysmal atrial fibrillation) ([place])    a. dx 03/2018 in setting of stroke, osteomyelitis.  . Sepsis ([place]) 03/20/2018  . Stage 3 chronic kidney disease    pt/family unaware  . 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 Stroke ([place]) 03/2018  . Stroke-like episode ([place]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 03/16/2018  . Tachycardia 03/20/2018  . Wrist fracture    Left    Past Surgical History:  Procedure Laterality Date  . AMPUTATION Left 03/24/2018   Procedure: Transmetatarsal Amputation Left Foot;  Surgeon: Kevin Minion, MD;  Location: [facility];  Service: Orthopedics;  Laterality: Left;  . CARDIOVERSION N/A 05/05/2018   Procedure: CARDIOVERSION;  Surgeon: Kevin Latch, MD;  Location: [facility]OSCOPY;  Service: Cardiovascular;  Laterality: N/A;  . COLONOSCOPY  12/2017   benign, remission from ulcerative colitis in 90s  . I & D EXTREMITY Left 03/17/2018   Procedure: IRRIGATION AND DEBRIDEMENT FOOT;  Surgeon: Kevin Stairs, MD;  Location: [facility];  Service: Orthopedics;  Laterality: Left;  . STUMP REVISION Left 05/10/2018   Procedure: REVISION LEFT TRANSMETATARSAL AMPUTATION;  Surgeon: Kevin Minion, MD;  Location: [facility];  Service: Orthopedics;  Laterality: Left;    Current Medications: Current Meds  Medication Sig  . diltiazem (CARDIZEM CD) 120 MG 24 hr capsule TAKE 1 CAPSULE  DAILY (DECREASE IN DOSE)  . ELIQUIS 5 MG TABS tablet TAKE 1 TABLET TWICE A DAY  . FLUoxetine (PROZAC) 20 MG capsule Take 20 mg by mouth daily.  . furosemide (LASIX) 20 MG tablet TAKE 1 TABLET DAILY. YOU MAY TAKE AN EXTRA 20 MG TABLET FOR INCREASED SWELLING OR WEIGHT GAIN  . insulin glargine (LANTUS) 100 UNIT/ML injection Inject 38 Units into the skin 2 (two) times daily.  . mesalamine (APRISO) 0.375 g 24 hr capsule Take 750 mg by mouth 2 (two) times daily before a meal.   . Multiple Vitamin (MULTIVITAMIN WITH MINERALS)  TABS tablet Take 1 tablet by mouth daily.  . rosuvastatin (CRESTOR) 10 MG tablet Take 5 mg by mouth daily.  . tamsulosin (FLOMAX) 0.4 MG CAPS capsule Take 1 capsule (0.4 mg total) by mouth 2 times daily at 12 noon and 4 pm.  . trimethoprim (TRIMPEX) 100 MG tablet Take 100 mg by mouth at bedtime.   . [DISCONTINUED] metoprolol succinate (TOPROL-XL) 100 MG 24 hr tablet Take 100 mg by mouth daily. Take with or immediately following a meal.     Allergies:   Patient has no known allergies.   Social History   Socioeconomic History  . Marital status: Married    Spouse name: Not on file  . Number of children: Not on file  . Years of education: Not on file  . Highest education level: Bachelor's degree (e.g., BA, AB, BS)  Occupational History  . Not on file  Tobacco Use  . Smoking status: Former Smoker    Packs/day: 1.00    Types: Cigarettes    Start date: 1963    Quit date: 1985    Years since quitting: 36.5  . Smokeless tobacco: Never Used  Vaping Use  . Vaping Use: Never used  Substance and Sexual Activity  . Alcohol use: Yes    Alcohol/week: 4.0 standard drinks    Types: 4 Cans of beer per week    Comment: social   . Drug use: Never  . Sexual activity: Not on file  Other Topics Concern  . Not on file  Social History Narrative   Lives at home with his wife and two cats. Married for almost 45 years   Right handed   Caffeine: 1 cup every morning   Social Determinants of Health   Financial Resource Strain:   . Difficulty of Paying Living Expenses:   Food Insecurity:   . Worried About Charity fundraiser in the Last Year:   . Arboriculturist in the Last Year:   Transportation Needs:   . Film/video editor (Medical):   Kevin [name] Lack of Transportation (Non-Medical):   Physical Activity:   . Days of Exercise per Week:   . Minutes of Exercise per Session:   Stress:   . Feeling of Stress :   Social Connections:   . Frequency of Communication with Friends and Family:   .  Frequency of Social Gatherings with Friends and Family:   . Attends Religious Services:   . Active Member of Clubs or Organizations:   . Attends Archivist Meetings:   Kevin [name] Marital Status:      Family History:  The patient's family history includes Hypertension in his father.   ROS:   Please see the history of present illness.    ROS All other systems reviewed and are negative.   PHYSICAL EXAM:   VS:  BP 122/74   Pulse (!) 58  Ht 6' 1"  (1.854 m)   Wt 248 lb (112.5 kg)   SpO2 95%   BMI 32.72 kg/m   Physical Exam  GEN: Obese, no acute distress Neck: without JVD, HJR, Bruit Lungs: decrease breath sounds throughout without rales/wheezing/rhochi Heart: RRR 1/6 systolic murmur, distant HS GI: soft, nontender, nondistended, + BS Ext: without cyanosis, clubbing, or edema, Good distal pulses bilaterally Neuro:  Alert and Oriented x 3, Psych: euthymic mood, full affect  Wt Readings from Last 3 Encounters:  04/09/20 248 lb (112.5 kg)  10/04/19 237 lb (107.5 kg)  08/19/19 223 lb (101.2 kg)      Studies/Labs Reviewed:   EKG:  EKG is  ordered today.  The ekg ordered today demonstrates Sinus bradycardia with first degree AV block and RBBB  Recent Labs: 08/24/2019: ALT 29; BUN 56; Creatinine, Ser 1.58; Hemoglobin 13.6; Magnesium 2.1; Platelets 275; Potassium 3.6; Sodium 137   Lipid Panel    Component Value Date/Time   CHOL 75 03/17/2018 0339   TRIG 86 08/19/2019 2150   HDL 13 (L) 03/17/2018 0339   CHOLHDL 5.8 03/17/2018 0339   VLDL 23 03/17/2018 0339   LDLCALC 39 03/17/2018 0339    Additional studies/ records that were reviewed today include:   Echo 2019 Study Conclusions   - Left ventricle: The cavity size was normal. Systolic function was    normal. The estimated ejection fraction was in the range of 60%    to 65%. Wall motion was normal; there were no regional wall    motion abnormalities. There was no evidence of elevated    ventricular filling pressure  by Doppler parameters.  - Left atrium: The atrium was moderately dilated.   Impressions:   - No cardiac source of emboli was indentified.   -------------------------------------------------------------------  Study data:  No prior study was available for comparison.  Study  status:  Routine.  Procedure:  The patient reported no pain pre or  post test. Transthoracic echocardiography. Image quality was  adequate.  Study completion:  There were no complications.  Transthoracic echocardiography.  M-mode, complete 2D, spectral  Doppler, and color Doppler.  Birthdate:  Patient birthdate:  12-15-1940.  Age:  Patient is 79 yr old.  Sex:  Gender: male.  BMI: 28.5 kg/m^2.  Blood pressure:     142/70  Patient status:  Inpatient.  Study date:  Study date: 03/17/2018. Study time: 10:38  AM.  Location:  Bedside.     ASSESSMENT:    1. Preoperative clearance   2. Atrial fibrillation, unspecified type ([place])   3. Chronic diastolic CHF (congestive heart failure) ([place])   4. DOE (dyspnea on exertion)   5. Essential hypertension   6. Stage 3 chronic kidney disease, unspecified whether stage 3a or 3b CKD   7. Stroke-like episode ([place]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   8. Shortness of breath      PLAN:  In order of problems listed above:  Preoperative cardiac clearance before undergoing colonoscopy 04/10/2020.  Anticoagulation clinic recommends he only hold Eliquis for 1 day due to elevated risk. Now complains of worsening shortness of breath with bending over or walking 30 feet despite being able to walk slowly on treadmill for 30 min daily. His CRI is >11 and METS over 5. With worsening symptoms we will check echo and lexiscan before undergoing colonoscopy. According to the Revised Cardiac Risk Index (RCRI), his Perioperative Risk of Major Cardiac Event is (%): 11  His Functional Capacity in METs is: 5.59 according to the [facility]  Activity Status Index (DASI).   PAF status post DCCV 05/05/2018 on Eliquis for CHA2DS2-VASc  score of 7.  Was bradycardic last office visit and diltiazem decreased. Still bradycardic-decrease Toprol to 50 mg once daily  Chronic diastolic CHF compensated.  DOE suspect multifactorial-weight gain, emphysema. No CHF on exam. Will update echo and lexiscan-multiple CV risk factors including DM. Refer to pulmonary. Refer to weight loss center after testing comes back.  Essential hypertension BP stable  CKD Stage III last Crt 1.58 07/2019  CVA treated with TPA 02/2018     Medication Adjustments/Labs and Tests Ordered: Current medicines are reviewed at length with the patient today.  Concerns regarding medicines are outlined above.  Medication changes, Labs and Tests ordered today are listed in the Patient Instructions below. Patient Instructions  Medication Instructions:  Your physician has recommended you make the following change in your medication:   START taking Metoprolol 9m daily  *If you need a refill on your cardiac medications before your next appointment, please call your pharmacy*   Lab Work: None If you have labs (blood work) drawn today and your tests are completely normal, you will receive your results only by: .Kevin KitchenMyChart Message (if you have MyChart) OR . A paper copy in the mail If you have any lab test that is abnormal or we need to change your treatment, we will call you to review the results.   Testing/Procedures: Your physician has requested that you have an echocardiogram. Echocardiography is a painless test that uses sound waves to create images of your heart. It provides your doctor with information about the size and shape of your heart and how well your heart's chambers and valves are working. This procedure takes approximately one hour. There are no restrictions for this procedure.  Your physician has requested that you have a lexiscan myoview. For further information please visit [email or web address] Please follow instruction sheet, as  given.    Follow-Up: [facility] and your [facility] needs are our priority.  As part of our continuing mission to provide you with exceptional heart care, we have created designated Provider Care Teams.  These Care Teams include your primary Cardiologist (physician) and Advanced Practice Providers (APPs -  Physician Assistants and Nurse Practitioners) who all work together to provide you with the care you need, when you need it.  We recommend signing up for the patient portal called "MyChart".  Sign up information is provided on this After Visit Summary.  MyChart is used to connect with patients for Virtual Visits (Telemedicine).  Patients are able to view lab/test results, encounter notes, upcoming appointments, etc.  Non-urgent messages can be sent to your provider as well.   To learn more about what you can do with MyChart, go to [email or web address]    Your next appointment:   04/22/2020 @ 8:15 with MErmalinda Barrios PA   Other Instructions Keep a record of your Blood Pressure, Pulse and Oxygen and bring with you to your next appointment.   You have been referred to a Pulmonary Dr for your Shortness of Breath. They will contact you to schedule and appointment.      SSumner Boast PA-C  04/09/2020 9:24 AM    [facility] [address] [place]  [id number]: [phone] Fax: [phone]

## 2020-04-07 NOTE — Telephone Encounter (Signed)
Prescription refill request for Eliquis received.  Last office visit: Mcalhany Scr: 1.58, 08/24/2019 Age: 79 y.o. Weight: 107.5 kg   Prescription refill sent.

## 2020-04-09 ENCOUNTER — Telehealth (HOSPITAL_COMMUNITY): Payer: Self-pay

## 2020-04-09 ENCOUNTER — Encounter: Payer: Self-pay | Admitting: Physician Assistant

## 2020-04-09 ENCOUNTER — Other Ambulatory Visit: Payer: Self-pay

## 2020-04-09 ENCOUNTER — Ambulatory Visit (INDEPENDENT_AMBULATORY_CARE_PROVIDER_SITE_OTHER): Payer: Medicare Other | Admitting: Physician Assistant

## 2020-04-09 ENCOUNTER — Telehealth: Payer: Self-pay

## 2020-04-09 VITALS — BP 122/74 | HR 58 | Ht 73.0 in | Wt 248.0 lb

## 2020-04-09 DIAGNOSIS — I4891 Unspecified atrial fibrillation: Secondary | ICD-10-CM

## 2020-04-09 DIAGNOSIS — R0600 Dyspnea, unspecified: Secondary | ICD-10-CM | POA: Diagnosis not present

## 2020-04-09 DIAGNOSIS — R2990 Unspecified symptoms and signs involving the nervous system: Secondary | ICD-10-CM

## 2020-04-09 DIAGNOSIS — R0609 Other forms of dyspnea: Secondary | ICD-10-CM

## 2020-04-09 DIAGNOSIS — R0602 Shortness of breath: Secondary | ICD-10-CM

## 2020-04-09 DIAGNOSIS — Z01818 Encounter for other preprocedural examination: Secondary | ICD-10-CM

## 2020-04-09 DIAGNOSIS — N183 Chronic kidney disease, stage 3 unspecified: Secondary | ICD-10-CM

## 2020-04-09 DIAGNOSIS — I5032 Chronic diastolic (congestive) heart failure: Secondary | ICD-10-CM | POA: Diagnosis not present

## 2020-04-09 DIAGNOSIS — I10 Essential (primary) hypertension: Secondary | ICD-10-CM

## 2020-04-09 MED ORDER — METOPROLOL SUCCINATE ER 50 MG PO TB24
50.0000 mg | ORAL_TABLET | Freq: Every day | ORAL | 0 refills | Status: DC
Start: 2020-04-09 — End: 2020-05-02

## 2020-04-09 NOTE — Patient Instructions (Addendum)
Medication Instructions:  Your physician has recommended you make the following change in your medication:   START taking Metoprolol 61m daily  *If you need a refill on your cardiac medications before your next appointment, please call your pharmacy*   Lab Work: None If you have labs (blood work) drawn today and your tests are completely normal, you will receive your results only by: .Marland KitchenMyChart Message (if you have MyChart) OR . A paper copy in the mail If you have any lab test that is abnormal or we need to change your treatment, we will call you to review the results.   Testing/Procedures: Your physician has requested that you have an echocardiogram. Echocardiography is a painless test that uses sound waves to create images of your heart. It provides your doctor with information about the size and shape of your heart and how well your heart's chambers and valves are working. This procedure takes approximately one hour. There are no restrictions for this procedure.  Your physician has requested that you have a lexiscan myoview. For further information please visit [email or web address] Please follow instruction sheet, as given.  **You will only take 1/2 of your Lantus dose the night before and do not take it at all the morning of your lexiscan!!!    Follow-Up: [facility] and your health needs are our priority.  As part of our continuing mission to provide you with exceptional heart care, we have created designated Provider Care Teams.  These Care Teams include your primary Cardiologist (physician) and Advanced Practice Providers (APPs -  Physician Assistants and Nurse Practitioners) who all work together to provide you with the care you need, when you need it.  We recommend signing up for the patient portal called "MyChart".  Sign up information is provided on this After Visit Summary.  MyChart is used to connect with patients for Virtual Visits (Telemedicine).  Patients are  able to view lab/test results, encounter notes, upcoming appointments, etc.  Non-urgent messages can be sent to your provider as well.   To learn more about what you can do with MyChart, go to [email or web address]    Your next appointment:   04/30/2020 @ 11:45 with MErmalinda Barrios PA   Other Instructions Keep a record of your Blood Pressure, Pulse and Oxygen and bring with you to your next appointment.   You have been referred to a Pulmonary Dr for your Shortness of Breath. They will contact you to schedule and appointment.

## 2020-04-09 NOTE — Telephone Encounter (Signed)
I called [facility] and cancelled pt's Colonoscopy that was scheduled for 04/10/2020. He is scheduled to have some test and to see Pulmonary Dr before he can be cleared.

## 2020-04-09 NOTE — Telephone Encounter (Signed)
Attempted to contact the patient for his instructions for his stress test. Detailed instructions left on the patient's answering machine. Asked to call back with any questions. S.Yaminah Clayborn EMTP

## 2020-04-15 ENCOUNTER — Ambulatory Visit (HOSPITAL_COMMUNITY): Payer: Medicare Other | Attending: Internal Medicine

## 2020-04-15 ENCOUNTER — Other Ambulatory Visit: Payer: Self-pay

## 2020-04-15 DIAGNOSIS — Z01818 Encounter for other preprocedural examination: Secondary | ICD-10-CM | POA: Diagnosis not present

## 2020-04-15 DIAGNOSIS — R0602 Shortness of breath: Secondary | ICD-10-CM

## 2020-04-15 LAB — MYOCARDIAL PERFUSION IMAGING
LV dias vol: 93 mL (ref 62–150)
LV sys vol: 41 mL
Peak HR: 106 {beats}/min
Rest HR: 77 {beats}/min
SDS: 4
SRS: 1
SSS: 5
TID: 0.98

## 2020-04-15 MED ORDER — TECHNETIUM TC 99M TETROFOSMIN IV KIT
31.5000 | PACK | Freq: Once | INTRAVENOUS | Status: AC | PRN
  Administered 2020-04-15: 31.5 via INTRAVENOUS
  Filled 2020-04-15: qty 32

## 2020-04-15 MED ORDER — TECHNETIUM TC 99M TETROFOSMIN IV KIT
10.4000 | PACK | Freq: Once | INTRAVENOUS | Status: AC | PRN
  Administered 2020-04-15: 10.4 via INTRAVENOUS
  Filled 2020-04-15: qty 11

## 2020-04-15 MED ORDER — REGADENOSON 0.4 MG/5ML IV SOLN
0.4000 mg | Freq: Once | INTRAVENOUS | Status: AC
  Administered 2020-04-15: 0.4 mg via INTRAVENOUS

## 2020-04-22 ENCOUNTER — Ambulatory Visit: Payer: Medicare Other | Admitting: Physician Assistant

## 2020-04-24 ENCOUNTER — Ambulatory Visit (HOSPITAL_COMMUNITY): Payer: Medicare Other | Attending: Cardiovascular Disease

## 2020-04-24 ENCOUNTER — Other Ambulatory Visit: Payer: Self-pay

## 2020-04-24 DIAGNOSIS — I5032 Chronic diastolic (congestive) heart failure: Secondary | ICD-10-CM

## 2020-04-24 DIAGNOSIS — R0602 Shortness of breath: Secondary | ICD-10-CM | POA: Diagnosis not present

## 2020-04-24 LAB — ECHOCARDIOGRAM COMPLETE
Area-P 1/2: 2.6 cm2
S' Lateral: 3.6 cm

## 2020-04-29 NOTE — Progress Notes (Signed)
Cardiology Office Note    Date:  04/30/2020   ID:  Kevin [name], Kevin [name] 20-Oct-1940, MRN [id number]  PCP:  [name], MD  Cardiologist: Lauree Chandler, MD EPS: None  Chief Complaint  Patient presents with  . Follow-up    History of Present Illness:  Kevin [name] is a 79 y.o. male with history of hypertension, DM, CVA, persistent atrial fibrillation on Eliquis for CHA2DS2-VASc score of 5, CKD stage III, chronic diastolic CHF, RBBB.  Patient had a CVA treated with TPA 02/2018 and was found to have new onset atrial fibrillation status post DCCV 05/05/2018.  2D echo 03/17/2018 normal LVEF 60 to 65% with moderate left atrial enlargement.  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     Patient last saw Dr. [name] 07/19/2019 at which time his weight was up 18 pounds but there was no evidence of fluid overload and no changes made.  I saw the patient 04/09/2020 for clearance for colonoscopy. He was complaining of shortness of breath with walking 30 feet but could walk on the treadmill 30 minutes daily without a problem. Helps with short of breath bending over and doing yard work.Echo 04/24/20 normal LVEF 55% with mild LVH, lexiscan, no ischemia, low risk. I also referred him to pulmonary. I also decreased his toprol because of bradycardia.  Patient comes in for f/u. He's wondering if his shortness of breath could be residual from covid19 in Nov. Very sick for 2 weeks but not hospitalized. Brings in list of pulse/pulse ox and BP's. O2 sats drop to high 80's when on treadmill but come back up. BP and pulse stable on lower dose toprol. Going on a [place] cruise in Sept.  Past Medical History:  Diagnosis Date  . A-fib ([place]) 03/20/2018  . Acute blood loss anemia   . Benign essential HTN   . Benign prostatic hyperplasia with urinary retention   . Cerebrovascular accident (CVA) ([place])   . CKD (chronic kidney disease), stage III   . Diabetes  ([name]) 03/20/2018  . Diabetes mellitus type 2 in nonobese (HCC)   . Essential hypertension 03/20/2018  . Former tobacco use   . H/O ulcerative colitis 1993  . Hypertension   . Hypokalemia   . Leukocytosis   . New onset atrial fibrillation ([name])   . Osteomyelitis ([name])    a. 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  [name] (paroxysmal atrial fibrillation) ([name])    a. dx 03/2018 in setting of stroke, osteomyelitis.  . Sepsis ([name]) 03/20/2018  . Stage 3 chronic kidney disease    pt/family unaware  . Status post transmetatarsal amputation of left foot ([name])   . Stroke ([name]) 03/2018  . Stroke-like episode ([name]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 03/16/2018  . Tachycardia 03/20/2018  . Wrist fracture    Left    Past Surgical History:  Procedure Laterality Date  . AMPUTATION Left 03/24/2018   Procedure: Transmetatarsal Amputation Left Foot;  Surgeon: [name], MD;  Location: [facility];  Service: Orthopedics;  Laterality: Left;  . CARDIOVERSION N/A 05/05/2018   Procedure: CARDIOVERSION;  Surgeon: [name], MD;  Location: [facility];  Service: Cardiovascular;  Laterality: N/A;  . COLONOSCOPY  12/2017   benign, remission from ulcerative colitis in 90s  . I & D EXTREMITY Left 03/17/2018   Procedure: IRRIGATION AND DEBRIDEMENT FOOT;  Surgeon: [name], MD;  Location: [name];  Service: Orthopedics;  Laterality: Left;  . STUMP REVISION Left 05/10/2018   Procedure:  REVISION LEFT TRANSMETATARSAL AMPUTATION;  Surgeon: [name], MD;  Location: [name];  Service: Orthopedics;  Laterality: Left;    Current Medications: Current Meds  Medication Sig  . diltiazem (CARDIZEM CD) 120 MG 24 hr capsule TAKE 1 CAPSULE DAILY (DECREASE IN DOSE)  . ELIQUIS 5 MG TABS tablet TAKE 1 TABLET TWICE A DAY  . FLUoxetine (PROZAC) 20 MG capsule Take 20 mg by mouth daily.  . furosemide (LASIX) 20 MG tablet TAKE 1 TABLET DAILY. YOU MAY TAKE AN EXTRA 20 MG TABLET FOR INCREASED SWELLING OR WEIGHT GAIN  . insulin glargine  (LANTUS) 100 UNIT/ML injection Inject 38 Units into the skin 2 (two) times daily.  . mesalamine (APRISO) 0.375 g 24 hr capsule Take 750 mg by mouth 2 (two) times daily before a meal.   . metoprolol succinate (TOPROL-XL) 50 MG 24 hr tablet Take 1 tablet (50 mg total) by mouth daily. Take with or immediately following a meal.  . Multiple Vitamin (MULTIVITAMIN WITH MINERALS) TABS tablet Take 1 tablet by mouth daily.  . rosuvastatin (CRESTOR) 10 MG tablet Take 5 mg by mouth daily.  . tamsulosin (FLOMAX) 0.4 MG CAPS capsule Take 1 capsule (0.4 mg total) by mouth 2 times daily at 12 noon and 4 pm.  . trimethoprim (TRIMPEX) 100 MG tablet Take 100 mg by mouth at bedtime.      Allergies:   Patient has no known allergies.   Social History   Socioeconomic History  . Marital status: Married    Spouse name: Not on file  . Number of children: Not on file  . Years of education: Not on file  . Highest education level: Bachelor's degree (e.g., BA, AB, BS)  Occupational History  . Not on file  Tobacco Use  . Smoking status: Former Smoker    Packs/day: 1.00    Types: Cigarettes    Start date: 1963    Quit date: 1985    Years since quitting: 36.6  . Smokeless tobacco: Never Used  Vaping Use  . Vaping Use: Never used  Substance and Sexual Activity  . Alcohol use: Yes    Alcohol/week: 4.0 standard drinks    Types: 4 Cans of beer per week    Comment: social   . Drug use: Never  . Sexual activity: Not on file  Other Topics Concern  . Not on file  Social History Narrative   Lives at home with his wife and two cats. Married for almost 45 years   Right handed   Caffeine: 1 cup every morning   Social Determinants of Health   Financial Resource Strain:   . Difficulty of Paying Living Expenses:   Food Insecurity:   . Worried About Charity fundraiser in the Last Year:   . Arboriculturist in the Last Year:   Transportation Needs:   . Film/video editor (Medical):   Marland Kitchen Lack of Transportation  (Non-Medical):   Physical Activity:   . Days of Exercise per Week:   . Minutes of Exercise per Session:   Stress:   . Feeling of Stress :   Social Connections:   . Frequency of Communication with Friends and Family:   . Frequency of Social Gatherings with Friends and Family:   . Attends Religious Services:   . Active Member of Clubs or Organizations:   . Attends Archivist Meetings:   Marland Kitchen Marital Status:      Family History:  The patient's family history  includes Hypertension in his father.   ROS:   Please see the history of present illness.    ROS All other systems reviewed and are negative.   PHYSICAL EXAM:   VS:  BP 138/72   Pulse 71   Ht 6' 1"  (1.854 m)   Wt 247 lb (112 kg)   SpO2 97%   BMI 32.59 kg/m   Physical Exam  GEN: Well nourished, well developed, in no acute distress  Neck: no JVD, carotid bruits, or masses Cardiac:RRR; no murmurs, rubs, or gallops  Respiratory:  Decreased breath sounds throughout but clear to auscultation bilaterally, normal work of breathing GI: soft, nontender, nondistended, + BS Ext: without cyanosis, clubbing, or edema, Good distal pulses bilaterally Neuro:  Alert and Oriented x 3 Psych: euthymic mood, full affect  Wt Readings from Last 3 Encounters:  04/30/20 247 lb (112 kg)  04/15/20 243 lb (110.2 kg)  04/09/20 248 lb (112.5 kg)      Studies/Labs Reviewed:   EKG:  EKG is not ordered today.    Recent Labs: 08/24/2019: ALT 29; BUN 56; Creatinine, Ser 1.58; Hemoglobin 13.6; Magnesium 2.1; Platelets 275; Potassium 3.6; Sodium 137   Lipid Panel    Component Value Date/Time   CHOL 75 03/17/2018 0339   TRIG 86 08/19/2019 2150   HDL 13 (L) 03/17/2018 0339   CHOLHDL 5.8 03/17/2018 0339   VLDL 23 03/17/2018 0339   LDLCALC 39 03/17/2018 0339    Additional studies/ records that were reviewed today include:  Lexiscan 04/15/20    The left ventricular ejection fraction is normal (55-65%).  Nuclear stress EF: 56%.  No  T wave inversion was noted during stress.  There was no ST segment deviation noted during stress.  Defect 1: There is a medium defect of moderate severity present in the basal inferior, mid inferior, apical inferior and apex location.  This is a low risk study.   Medium size, moderate intensity fixed (SDS 0) inferior perfusion defect, suggestive of RBBB artifact.  No significant reversible ischemia. LVEF 56% with normal wall motion. This is a low risk study.    Echo 04/24/20 IMPRESSIONS     1. Abnormal septal motion . Left ventricular ejection fraction, by  estimation, is 55%. The left ventricle has low normal function. The left  ventricle has no regional wall motion abnormalities. The left ventricular  internal cavity size was mildly  dilated. There is mild left ventricular hypertrophy. Left ventricular  diastolic parameters were normal.   2. Right ventricular systolic function is normal. The right ventricular  size is normal.   3. Left atrial size was moderately dilated.   4. The mitral valve is normal in structure. Trivial mitral valve  regurgitation. No evidence of mitral stenosis.   5. The aortic valve is normal in structure. Aortic valve regurgitation is  not visualized. Mild aortic valve sclerosis is present, with no evidence  of aortic valve stenosis.   6. The inferior vena cava is normal in size with greater than 50%  respiratory variability, suggesting right atrial pressure of 3 mmHg.   FINDINGS   Left Ventricle: Abnormal septal motion. Left ventricular ejection  fraction, by estimation, is 55%. The left ventricle has low normal  function. The left ventricle has no regional wall motion abnormalities.  The left ventricular internal cavity size was  mildly dilated. There is mild left ventricular hypertrophy. Left  ventricular diastolic parameters were normal.   Right Ventricle: The right ventricular size is normal. No increase in  right ventricular wall thickness.  Right ventricular systolic function is  normal.     ASSESSMENT:    1. Preoperative clearance   2. Paroxysmal atrial fibrillation (HCC)   3. Chronic diastolic CHF (congestive heart failure) ([place])   4. DOE (dyspnea on exertion)   5. Essential hypertension   6. Stage 3 chronic kidney disease, unspecified whether stage 3a or 3b CKD   7. Cerebrovascular accident (CVA) due to embolism of right middle cerebral artery (HCC)      PLAN:  In order of problems listed above:  Preoperative cardiac clearance before undergoing colonoscopy 04/10/2020.  Anticoagulation clinic recommends he only hold Eliquis for 1 day due to elevated risk.Was complaining of worsening shortness of breath with bending over or walking 30 feet despite being able to walk slowly on treadmill for 30 min daily. His CRI is >11 and METS over 5. Lexiscan myoview low risk no ischemia and eho with normal LVEF. Can proceed with colonoscopy from cardiac standpoint. Further work up for dyspnea with pulmonary.  According to the Revised Cardiac Risk Index (RCRI), his Perioperative Risk of Major Cardiac Event is (%): 11   His Functional Capacity in METs is: 5.59 according to the [employer] Activity Status Index (DASI).     PAF status post DCCV 05/05/2018 on Eliquis for CHA2DS2-VASc score of 7.  has been bradycardic   and diltiazem decreased. Was Still bradycardic and I decrease Toprol to 50 mg once daily-HR doing much better and breathing a little better   Chronic diastolic CHF compensated.   DOE suspect multifactorial-weight gain, emphysema. No CHF on exam.  echo and lexiscan-unremarkable. Referred to pulmonary and appt later this month. Offered referal to weight loss management center but wants to hold off for now   Essential hypertension BP stable   CKD Stage III last Crt 1.66 62021   CVA treated with TPA 02/2018         Medication Adjustments/Labs and Tests Ordered: Current medicines are reviewed at length with the patient today.   Concerns regarding medicines are outlined above.  Medication changes, Labs and Tests ordered today are listed in the Patient Instructions below. Patient Instructions  Medication Instructions:  Your physician recommends that you continue on your current medications as directed. Please refer to the Current Medication list given to you today.  *If you need a refill on your cardiac medications before your next appointment, please call your pharmacy*   Lab Work: None If you have labs (blood work) drawn today and your tests are completely normal, you will receive your results only by: Marland Kitchen MyChart Message (if you have MyChart) OR . A paper copy in the mail If you have any lab test that is abnormal or we need to change your treatment, we will call you to review the results.   Testing/Procedures: None   Follow-Up: At [facility], you and your health needs are our priority.  As part of our continuing mission to provide you with exceptional heart care, we have created designated Provider Care Teams.  These Care Teams include your primary Cardiologist (physician) and Advanced Practice Providers (APPs -  Physician Assistants and Nurse Practitioners) who all work together to provide you with the care you need, when you need it.  We recommend signing up for the patient portal called "MyChart".  Sign up information is provided on this After Visit Summary.  MyChart is used to connect with patients for Virtual Visits (Telemedicine).  Patients are able to view lab/test results, encounter notes,  upcoming appointments, etc.  Non-urgent messages can be sent to your provider as well.   To learn more about what you can do with MyChart, go to [email or web address].    Your next appointment:   6 month(s)  The format for your next appointment:   In Person  Provider:   You may see Lauree Chandler, MD or one of the following Advanced Practice Providers on your designated Care Team:    Melina Copa,  PA-C  Ermalinda Barrios, PA-C    Other Instructions None     Signed, Ermalinda Barrios, PA-C  04/30/2020 12:15 PM    [address], [place], [place]  [address] Phone: [phone]; Fax: [phone]

## 2020-04-30 ENCOUNTER — Encounter: Payer: Self-pay | Admitting: Physician Assistant

## 2020-04-30 ENCOUNTER — Other Ambulatory Visit: Payer: Self-pay

## 2020-04-30 ENCOUNTER — Ambulatory Visit (INDEPENDENT_AMBULATORY_CARE_PROVIDER_SITE_OTHER): Payer: Medicare Other | Admitting: Physician Assistant

## 2020-04-30 VITALS — BP 138/72 | HR 71 | Ht 73.0 in | Wt 247.0 lb

## 2020-04-30 DIAGNOSIS — Z01818 Encounter for other preprocedural examination: Secondary | ICD-10-CM

## 2020-04-30 DIAGNOSIS — R2990 Unspecified symptoms and signs involving the nervous system: Secondary | ICD-10-CM | POA: Diagnosis not present

## 2020-04-30 DIAGNOSIS — R0600 Dyspnea, unspecified: Secondary | ICD-10-CM | POA: Diagnosis not present

## 2020-04-30 DIAGNOSIS — N183 Chronic kidney disease, stage 3 unspecified: Secondary | ICD-10-CM

## 2020-04-30 DIAGNOSIS — I63411 Cerebral infarction due to embolism of right middle cerebral artery: Secondary | ICD-10-CM | POA: Diagnosis not present

## 2020-04-30 DIAGNOSIS — I5032 Chronic diastolic (congestive) heart failure: Secondary | ICD-10-CM | POA: Diagnosis not present

## 2020-04-30 DIAGNOSIS — I10 Essential (primary) hypertension: Secondary | ICD-10-CM

## 2020-04-30 DIAGNOSIS — I480 Paroxysmal atrial fibrillation: Secondary | ICD-10-CM | POA: Diagnosis not present

## 2020-04-30 DIAGNOSIS — R0609 Other forms of dyspnea: Secondary | ICD-10-CM

## 2020-04-30 NOTE — Patient Instructions (Signed)
Medication Instructions:  Your physician recommends that you continue on your current medications as directed. Please refer to the Current Medication list given to you today.  *If you need a refill on your cardiac medications before your next appointment, please call your pharmacy*   Lab Work: None If you have labs (blood work) drawn today and your tests are completely normal, you will receive your results only by: Marland Kitchen MyChart Message (if you have MyChart) OR . A paper copy in the mail If you have any lab test that is abnormal or we need to change your treatment, we will call you to review the results.   Testing/Procedures: None   Follow-Up: At [facility], you and your health needs are our priority.  As part of our continuing mission to provide you with exceptional heart care, we have created designated Provider Care Teams.  These Care Teams include your primary Cardiologist (physician) and Advanced Practice Providers (APPs -  Physician Assistants and Nurse Practitioners) who all work together to provide you with the care you need, when you need it.  We recommend signing up for the patient portal called "MyChart".  Sign up information is provided on this After Visit Summary.  MyChart is used to connect with patients for Virtual Visits (Telemedicine).  Patients are able to view lab/test results, encounter notes, upcoming appointments, etc.  Non-urgent messages can be sent to your provider as well.   To learn more about what you can do with MyChart, go to [email or web address].    Your next appointment:   6 month(s)  The format for your next appointment:   In Person  Provider:   You may see Lauree Chandler, MD or one of the following Advanced Practice Providers on your designated Care Team:    Melina Copa, PA-C  Ermalinda Barrios, PA-C    Other Instructions None

## 2020-05-02 ENCOUNTER — Other Ambulatory Visit: Payer: Self-pay | Admitting: Physician Assistant

## 2020-05-07 ENCOUNTER — Other Ambulatory Visit: Payer: Self-pay

## 2020-05-07 MED ORDER — METOPROLOL SUCCINATE ER 50 MG PO TB24: 50.0000 mg | ORAL_TABLET | Freq: Every day | ORAL | 3 refills | Status: DC

## 2020-05-13 ENCOUNTER — Other Ambulatory Visit: Payer: Self-pay

## 2020-05-13 ENCOUNTER — Other Ambulatory Visit: Payer: Self-pay | Admitting: Internal Medicine

## 2020-05-13 ENCOUNTER — Encounter: Payer: Self-pay | Admitting: Internal Medicine

## 2020-05-13 ENCOUNTER — Ambulatory Visit (INDEPENDENT_AMBULATORY_CARE_PROVIDER_SITE_OTHER): Payer: Medicare Other | Admitting: Internal Medicine

## 2020-05-13 VITALS — BP 132/80 | HR 72 | Ht 73.0 in | Wt 252.4 lb

## 2020-05-13 DIAGNOSIS — J439 Emphysema, unspecified: Secondary | ICD-10-CM

## 2020-05-13 DIAGNOSIS — R053 Chronic cough: Secondary | ICD-10-CM

## 2020-05-13 DIAGNOSIS — Z87891 Personal history of nicotine dependence: Secondary | ICD-10-CM | POA: Diagnosis not present

## 2020-05-13 DIAGNOSIS — R918 Other nonspecific abnormal finding of lung field: Secondary | ICD-10-CM | POA: Diagnosis not present

## 2020-05-13 DIAGNOSIS — R0600 Dyspnea, unspecified: Secondary | ICD-10-CM | POA: Diagnosis not present

## 2020-05-13 DIAGNOSIS — R2990 Unspecified symptoms and signs involving the nervous system: Secondary | ICD-10-CM | POA: Diagnosis not present

## 2020-05-13 DIAGNOSIS — Z8616 Personal history of COVID-19: Secondary | ICD-10-CM

## 2020-05-13 DIAGNOSIS — R062 Wheezing: Secondary | ICD-10-CM | POA: Diagnosis not present

## 2020-05-13 DIAGNOSIS — R0609 Other forms of dyspnea: Secondary | ICD-10-CM

## 2020-05-13 DIAGNOSIS — R05 Cough: Secondary | ICD-10-CM

## 2020-05-13 MED ORDER — SPIRIVA RESPIMAT 2.5 MCG/ACT IN AERS: 2.0000 | INHALATION_SPRAY | Freq: Every day | RESPIRATORY_TRACT | 5 refills | Status: DC

## 2020-05-13 MED ORDER — ALBUTEROL SULFATE HFA 108 (90 BASE) MCG/ACT IN AERS
2.0000 | INHALATION_SPRAY | Freq: Four times a day (QID) | RESPIRATORY_TRACT | 6 refills | Status: DC | PRN
Start: 2020-05-13 — End: 2020-06-03

## 2020-05-13 NOTE — Telephone Encounter (Signed)
MR, please advise if you are okay for pt switching from Spiriva which you ordered at [facility] to Incruse as Spiriva is not covered by pt's insurance.

## 2020-05-13 NOTE — Progress Notes (Signed)
OV 05/13/2020  Subjective:  Patient ID: Kevin [name], male , DOB: 10/06/1940 , age 79 y.o. , MRN: [id number] , ADDRESS: [address] [place] [address]   05/13/2020 -   Chief Complaint  Patient presents with  . Consult    Pt is being referred due to SOB which he has had at least a year. Pt states SOB is worse with activities but there are times that he has it all the time. Pt states he does have SOB with exertion.     HPI Kevin [name] 79 y.o. -presents with his wife.  He is originally from [place].  Used to live in [place] for 12 years and then moved to [place] around 20 years ago.  When he moved to [facility] he had a 20 pack or more smoking history but he quit because of the altitude and shortness of breath.  He says that he has been doing well but in early 2020 in February he had respiratory viral infection.  He thinks this might have been Covid because of the lack of diagnostic testing that time.  Then again around Thanksgiving 2020 he did have confirmed COVID-19.  He was hospitalized but not placed on the ventilator.  He has since recovered.  #2020 high-resolution CT scan of the chest shows emphysema and basal pulmonary infiltrates suggestive of atelectasis/early ILD.  Is not in the formal report.  He had a follow-up CT chest in March 2021 that I also personally visualized and is unchanged.  There is no nodules or masses.  He says now for the last 1 year even before the Covid and then following the Covid he has insidious onset of shortness of breath that is progressively getting worse.  When he does workout on the treadmill for 15 minutes he desaturates.  This is at 1.7 mph speed at a 2 degree incline.  He showed me the recordings for that.  Simple walking desaturation test shows 3% oxygen drop today in the office.  Wife is also reporting cough and wheezing.  He is reluctant to take steroids systemically because of diabetes.  Used to work with [employer].  They used to mine gold.  He worked mostly in the computer stations.  He did not go to the [employer] as much.  However he has traveled extensively to [place] and other places.      Simple office walk 185 feet x  3 laps goal with forehead probe 05/13/2020   O2 used ra  Number laps completed 3  Comments about pace avg  Resting Pulse Ox/HR 97% and 76/min  Final Pulse Ox/HR 94% and 100/min  Desaturated </= 88% no  Desaturated <= 3% points yes  Got Tachycardic >/= 90/min yes  Symptoms at end of test Moderate dyspena  Miscellaneous comments x     w ROS - per HPI  No flowsheet data found.  Results for JAYCEION, LISENBY "[name]" (MRN [id number]) as of 05/13/2020 15:23  Ref. Range 08/24/2019 01:15  Creatinine Latest Ref Range: 0.61 - 1.24 mg/dL 1.58 (H)   Results for App, Kadan DUANE "KYRI DAI" (MRN [id number]) as of 05/13/2020 15:23  Ref. Range 08/24/2019 01:15  Hemoglobin Latest Ref Range: 13.0 - 17.0 g/dL 13.6    has a past medical history of A-fib ([place]) (03/20/2018), Acute blood loss anemia, Benign essential HTN, Benign prostatic hyperplasia with urinary retention, Cerebrovascular accident (CVA) ([place]), CKD (chronic kidney disease), stage III, Diabetes (  [place]) (03/20/2018), Diabetes mellitus type 2 in nonobese [facility]), Essential hypertension (03/20/2018), Former tobacco use, H/O ulcerative colitis (1993), Hypertension, Hypokalemia, Leukocytosis, New onset atrial fibrillation ([place]), Osteomyelitis ([place]), PAF (paroxysmal atrial fibrillation) ([place]), Sepsis ([place]) (03/20/2018), Stage 3 chronic kidney disease, Status post transmetatarsal amputation of left foot ([place]), Stroke ([place]) (03/2018), Stroke-like episode ([place]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 (03/16/2018), Tachycardia (03/20/2018), and Wrist fracture.   reports that he quit smoking about 36 years ago. His smoking use included cigarettes. He started smoking about 58 years ago. He smoked 1.00 pack per day. He has never used smokeless  tobacco.  Past Surgical History:  Procedure Laterality Date  . AMPUTATION Left 03/24/2018   Procedure: Transmetatarsal Amputation Left Foot;  Surgeon: [name], MD;  Location: [facility];  Service: Orthopedics;  Laterality: Left;  . CARDIOVERSION N/A 05/05/2018   Procedure: CARDIOVERSION;  Surgeon: [name], MD;  Location: [facility] ENDOSCOPY;  Service: Cardiovascular;  Laterality: N/A;  . COLONOSCOPY  12/2017   benign, remission from ulcerative colitis in 90s  . I & D EXTREMITY Left 03/17/2018   Procedure: IRRIGATION AND DEBRIDEMENT FOOT;  Surgeon: [name], MD;  Location: [facility];  Service: Orthopedics;  Laterality: Left;  . STUMP REVISION Left 05/10/2018   Procedure: REVISION LEFT TRANSMETATARSAL AMPUTATION;  Surgeon: [name], MD;  Location: [facility];  Service: Orthopedics;  Laterality: Left;    No Known Allergies  Immunization History  Administered Date(s) Administered  . Influenza, High Dose Seasonal PF 11/26/2019  . PFIZER SARS-COV-2 Vaccination 12/27/2019, 01/21/2020    Family History  Problem Relation Age of Onset  . Hypertension Father      Current Outpatient Medications:  .  diltiazem (CARDIZEM CD) 120 MG 24 hr capsule, TAKE 1 CAPSULE DAILY (DECREASE IN DOSE), Disp: 90 capsule, Rfl: 3 .  ELIQUIS 5 MG TABS tablet, TAKE 1 TABLET TWICE A DAY, Disp: 180 tablet, Rfl: 1 .  FLUoxetine (PROZAC) 20 MG capsule, Take 20 mg by mouth daily., Disp: , Rfl:  .  furosemide (LASIX) 20 MG tablet, TAKE 1 TABLET DAILY. YOU MAY TAKE AN EXTRA 20 MG TABLET FOR INCREASED SWELLING OR WEIGHT GAIN, Disp: 90 tablet, Rfl: 3 .  insulin glargine (LANTUS) 100 UNIT/ML injection, Inject 38 Units into the skin 2 (two) times daily., Disp: , Rfl:  .  mesalamine (LIALDA) 1.2 g EC tablet, Take 4.8 g by mouth daily., Disp: , Rfl:  .  metoprolol succinate (TOPROL-XL) 50 MG 24 hr tablet, Take 1 tablet (50 mg total) by mouth daily. Take with or immediately following a meal., Disp: 90 tablet, Rfl:  3 .  Multiple Vitamin (MULTIVITAMIN WITH MINERALS) TABS tablet, Take 1 tablet by mouth daily., Disp: , Rfl:  .  rosuvastatin (CRESTOR) 10 MG tablet, Take 5 mg by mouth daily., Disp: , Rfl:  .  tamsulosin (FLOMAX) 0.4 MG CAPS capsule, Take 1 capsule (0.4 mg total) by mouth 2 times daily at 12 noon and 4 pm., Disp: 60 capsule, Rfl: 0 .  trimethoprim (TRIMPEX) 100 MG tablet, Take 100 mg by mouth at bedtime. , Disp: , Rfl: 11      Objective:   Vitals:   05/13/20 1457  BP: 132/80  Pulse: 72  SpO2: 94%  Weight: 252 lb 6.4 oz (114.5 kg)  Height: 6' 1"  (1.854 m)    Estimated body mass index is 33.3 kg/m as calculated from the following:   Height as of this encounter: 6' 1"  (1.854 m).   Weight as of this encounter:  252 lb 6.4 oz (114.5 kg).  @WEIGHTCHANGE @  [employer]   05/13/20 1457  Weight: 252 lb 6.4 oz (114.5 kg)     Physical Exam  General Appearance:    Alert, cooperative, no distress, appears stated age - yes , Deconditioned looking - no , OBESE  - no, Sitting on Wheelchair -  no  Head:    Normocephalic, without obvious abnormality, atraumatic  Eyes:    PERRL, conjunctiva/corneas clear,  Ears:    Normal TM's and external ear canals, both ears  Nose:   Nares normal, septum midline, mucosa normal, no drainage    or sinus tenderness. OXYGEN ON  - no . Patient is @ no   Throat:   Lips, mucosa, and tongue normal; teeth and gums normal. Cyanosis on lips - no  Neck:   Supple, symmetrical, trachea midline, no adenopathy;    thyroid:  no enlargement/tenderness/nodules; no carotid   bruit or JVD  Back:     Symmetric, no curvature, ROM normal, no CVA tenderness  Lungs:     Distress - no , Wheeze n, Barrell Chest - ono, Purse lip breathing - no, Crackles - no   Chest Wall:    No tenderness or deformity.    Heart:    Regular rate and rhythm, S1 and S2 normal, no rub   or gallop, Murmur - no  Breast Exam:    NOT DONE  Abdomen:     Soft, non-tender, bowel sounds active all four  quadrants,    no masses, no organomegaly. Visceral obesity - yes  Genitalia:   NOT DONE  Rectal:   NOT DONE  Extremities:   Extremities - normal, Has Cane - no, Clubbing - no, Edema - no  Pulses:   2+ and symmetric all extremities  Skin:   Stigmata of Connective Tissue Disease - no  Lymph nodes:   Cervical, supraclavicular, and axillary nodes normal  Psychiatric:  Neurologic:   Pleasant - yes, Anxious - no, Flat affect - no  CAm-ICU - neg, Alert and Oriented x 3 - yes, Moves all 4s - yes, Speech - normal, Cognition - intact           Assessment:       ICD-10-CM   1. Dyspnea on exertion  R06.00   2. Chronic cough  R05   3. Wheeze  R06.2   4. Pulmonary emphysema, unspecified emphysema type ([place])  J43.9   5. Stopped smoking with greater than 20 pack year history  Z87.891   6. Pulmonary infiltrates  R91.8   7. History of COVID-19  Z86.16        Plan:     Patient Instructions     ICD-10-CM   1. Dyspnea on exertion  R06.00   2. Chronic cough  R05   3. Wheeze  R06.2   4. Pulmonary emphysema, unspecified emphysema type ([place])  J43.9   5. Stopped smoking with greater than 20 pack year history  Z87.891   6. Pulmonary infiltrates  R91.8   7. 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  This is being unmasked by viral infection February 2020 and COVID-16 August 2019  He also have basal interstitial lung abnormalities [I LA)   -This could be post Covid  -We need to keep an eye on this  Plan -Start Spiriva Respimat high-dose 2 puffs daily -Start albuterol as needed -Monitor oxygen level with exertion -Get full pulmonary function test in the next  few to several weeks  Follow-up  -If you do not feel fully improved with the Spiriva in the next 1-2 weeks -please call us and we will step up therapy  -Return to see myself or nurse practitioner in the 4 to 8 weeks to discuss pulmonary function test results and response to Spiriva  -We will aim for follow-up  high-resolution CT chest sometime in spring 2022 [earlier if needed]     SIGNATURE    Dr. Brand Males, M.D., F.C.C.P,  Pulmonary and Critical Care Medicine Staff Physician, [place] Director - Interstitial Lung Disease  Program  Pulmonary [place] at [place], [place], [address]  Pager: [phone], If no answer or between  15:00h - 7:00h: call [phone] Telephone: [phone]  3:51 PM 05/13/2020

## 2020-05-13 NOTE — Patient Instructions (Addendum)
ICD-10-CM   1. Dyspnea on exertion  R06.00   2. Chronic cough  R05   3. Wheeze  R06.2   4. Pulmonary emphysema, unspecified emphysema type ([place])  J43.9   5. Stopped smoking with greater than 20 pack year history  Z87.891   6. Pulmonary infiltrates  R91.8   7. 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  This is being unmasked by viral infection February 2020 and COVID-16 August 2019  He also have basal interstitial lung abnormalities [I LA)   -This could be post Covid  -We need to keep an eye on this  Plan -Start Spiriva Respimat high-dose 2 puffs daily -Start albuterol as needed -Monitor oxygen level with exertion -Get full pulmonary function test in the next few to several weeks  Follow-up  -If you do not feel fully improved with the Spiriva in the next 1-2 weeks -please call us and we will step up therapy  -Return to see myself or nurse practitioner in the 4 to 8 weeks to discuss pulmonary function test results and response to Spiriva  -We will aim for follow-up high-resolution CT chest sometime in spring 2022 [earlier if needed]

## 2020-05-14 ENCOUNTER — Telehealth: Payer: Self-pay | Admitting: Internal Medicine

## 2020-05-14 NOTE — Telephone Encounter (Signed)
Patient needs an alternative to Spiriva, cost is 447 per month, please advise. [place], FYI to triage.

## 2020-05-15 NOTE — Telephone Encounter (Signed)
He can call his insurance for what is preferred in his plan. Also, pls call his pharmacy and price it out

## 2020-05-16 NOTE — Telephone Encounter (Signed)
Attempted to call pt's wife [name] but unable to reach. Left message for her to return call.

## 2020-05-19 MED ORDER — INCRUSE ELLIPTA 62.5 MCG/INH IN AEPB: 1.0000 | INHALATION_SPRAY | Freq: Every day | RESPIRATORY_TRACT | 11 refills | Status: DC

## 2020-05-19 NOTE — Telephone Encounter (Signed)
Yes thsi is fine - incruse

## 2020-05-19 NOTE — Telephone Encounter (Signed)
Called and left VM letting patient know about new inhaler, Incruse 1 puff daily being sent to [facility] pharmacy[facility].  Advised to call the office with any questions.

## 2020-05-19 NOTE — Telephone Encounter (Signed)
MR- insurance prefers incruse- is this appropriate?

## 2020-05-19 NOTE — Telephone Encounter (Signed)
[name] states Spiriva needs prior authorization thru [employer]. [facility] phone number is [phone]. May also use Incruse [employer]. Incruse at [place]. [place] phone number is [phone].

## 2020-05-21 NOTE — Telephone Encounter (Signed)
Patient called and states Dr. Encarnacion Slates office still has not received the clearance for the patient's cardiology. Please fax clearance to [phone]

## 2020-05-21 NOTE — Telephone Encounter (Signed)
   Primary Cardiologist: Lauree Chandler, MD  Chart reviewed as part of pre-operative protocol coverage.  The patient was seen in the office 04/30/2020 for preop clearance.  Given past medical history and time since last visit, based on ACC/AHA guidelines, Kevin [name] would be at acceptable risk for the planned procedure without further cardiovascular testing.   We recommend the patient hold Eliquis 1 day preop and resume as soon as safe postop.  I will route this recommendation to the requesting party via Epic fax function and remove from pre-op pool.  Please call with questions.  Kerin Ransom, PA-C 05/21/2020, 1:41 PM

## 2020-05-27 ENCOUNTER — Other Ambulatory Visit: Payer: Self-pay | Admitting: Internal Medicine

## 2020-06-03 ENCOUNTER — Other Ambulatory Visit: Payer: Self-pay | Admitting: *Deleted

## 2020-06-03 ENCOUNTER — Telehealth: Payer: Self-pay | Admitting: Internal Medicine

## 2020-06-03 DIAGNOSIS — J439 Emphysema, unspecified: Secondary | ICD-10-CM

## 2020-06-03 MED ORDER — INCRUSE ELLIPTA 62.5 MCG/INH IN AEPB: 1.0000 | INHALATION_SPRAY | Freq: Every day | RESPIRATORY_TRACT | 1 refills | Status: DC

## 2020-06-03 MED ORDER — ALBUTEROL SULFATE HFA 108 (90 BASE) MCG/ACT IN AERS: 2.0000 | INHALATION_SPRAY | Freq: Four times a day (QID) | RESPIRATORY_TRACT | 1 refills | Status: DC | PRN

## 2020-06-03 NOTE — Telephone Encounter (Signed)
Attempted to call [facility] but office had been closed. Will have to call back tomorrow 9/8.

## 2020-06-04 NOTE — Telephone Encounter (Signed)
Fax has been received from [facility] which will be given to MR 9/9. Will close this encounter.

## 2020-06-17 ENCOUNTER — Ambulatory Visit: Payer: Medicare Other | Admitting: Adult Health

## 2020-06-26 ENCOUNTER — Ambulatory Visit (INDEPENDENT_AMBULATORY_CARE_PROVIDER_SITE_OTHER): Payer: Medicare Other | Admitting: Adult Health

## 2020-06-26 ENCOUNTER — Encounter: Payer: Self-pay | Admitting: Adult Health

## 2020-06-26 ENCOUNTER — Other Ambulatory Visit: Payer: Self-pay

## 2020-06-26 ENCOUNTER — Ambulatory Visit (INDEPENDENT_AMBULATORY_CARE_PROVIDER_SITE_OTHER): Payer: Medicare Other | Admitting: Internal Medicine

## 2020-06-26 VITALS — BP 120/60 | HR 67 | Temp 97.2°F | Ht 73.0 in | Wt 251.6 lb

## 2020-06-26 DIAGNOSIS — R0609 Other forms of dyspnea: Secondary | ICD-10-CM

## 2020-06-26 DIAGNOSIS — J8410 Pulmonary fibrosis, unspecified: Secondary | ICD-10-CM | POA: Diagnosis not present

## 2020-06-26 DIAGNOSIS — J449 Chronic obstructive pulmonary disease, unspecified: Secondary | ICD-10-CM | POA: Diagnosis not present

## 2020-06-26 DIAGNOSIS — R2990 Unspecified symptoms and signs involving the nervous system: Secondary | ICD-10-CM

## 2020-06-26 DIAGNOSIS — Z8616 Personal history of COVID-19: Secondary | ICD-10-CM

## 2020-06-26 DIAGNOSIS — J439 Emphysema, unspecified: Secondary | ICD-10-CM | POA: Diagnosis not present

## 2020-06-26 DIAGNOSIS — R0600 Dyspnea, unspecified: Secondary | ICD-10-CM

## 2020-06-26 LAB — PULMONARY FUNCTION TEST
DL/VA % pred: 53 %
DL/VA: 2.09 ml/min/mmHg/L
DLCO cor % pred: 50 %
DLCO cor: 13.67 ml/min/mmHg
DLCO unc % pred: 50 %
DLCO unc: 13.67 ml/min/mmHg
FEF 25-75 Post: 1.19 L/sec
FEF 25-75 Pre: 0.93 L/sec
FEF2575-%Change-Post: 27 %
FEF2575-%Pred-Post: 48 %
FEF2575-%Pred-Pre: 37 %
FEV1-%Change-Post: 4 %
FEV1-%Pred-Post: 67 %
FEV1-%Pred-Pre: 63 %
FEV1-Post: 2.29 L
FEV1-Pre: 2.18 L
FEV1FVC-%Change-Post: -2 %
FEV1FVC-%Pred-Pre: 75 %
FEV6-%Change-Post: 7 %
FEV6-%Pred-Post: 93 %
FEV6-%Pred-Pre: 87 %
FEV6-Post: 4.16 L
FEV6-Pre: 3.87 L
FEV6FVC-%Change-Post: 0 %
FEV6FVC-%Pred-Post: 102 %
FEV6FVC-%Pred-Pre: 102 %
FVC-%Change-Post: 7 %
FVC-%Pred-Post: 91 %
FVC-%Pred-Pre: 84 %
FVC-Post: 4.31 L
FVC-Pre: 4 L
Post FEV1/FVC ratio: 53 %
Post FEV6/FVC ratio: 97 %
Pre FEV1/FVC ratio: 55 %
Pre FEV6/FVC Ratio: 97 %
RV % pred: 147 %
RV: 4.03 L
TLC % pred: 104 %
TLC: 7.96 L

## 2020-06-26 NOTE — Progress Notes (Signed)
@Patient  ID: Kevin [name], male    DOB: August 08, 1941, 79 y.o.   MRN: [id number]  Chief Complaint  Patient presents with  . Follow-up    COPD     Referring provider: [name], MD  HPI: 79 year old male former smoker seen for pulmonary consult May 13, 2020 for shortness of breath.  Had COVID-19 infection in November 2020.  Did require hospitalization. Patient worked at a Nurse, adult work, has had extensive travel  TEST/EVENTS :  Simple walk test May 13, 2020 no desaturations on room air CT chest December 24, 2019-diffuse emphysema, scattered areas of subpleural scarring and fibrosis, consolidation resolved in the right lower lobe.  06/26/2020 Follow up ; COPD  Patient returns for a 52-monthfollow-up.  Patient was seen last visit for a pulmonary consult for shortness of breath.  Patient is a former smoker.  He also had COVID-19 infection that required hospitalization in November 2020.  Previous CT chest showed diffuse emphysema.  There was some scattered areas of subpleural scarring and fibrosis noted.  Patient was set up for pulmonary function testing that was completed today that showed moderate airflow obstruction with an FEV1 at 67%, ratio 53, FVC 91%, no significant bronchodilator response, DLCO 50%. Last visit patient was started on INCRUSE.  Since last visit patient feels some better with slightly less shortness of breath.  He is very active he does his own yard work and exercises on the treadmill quite often.  Main issues is if he is having to do a lot of walking on incline.  [place] did [place]. Patient has minimum cough or congestion.  He does feel that the Incruse is helping.  No Known Allergies  Immunization History  Administered Date(s) Administered  . Influenza, High Dose Seasonal PF 11/26/2019  . PFIZER SARS-COV-2 Vaccination 12/27/2019, 01/21/2020    Past Medical History:  Diagnosis  Date  . A-fib ([facility] 03/20/2018  . Acute blood loss anemia   . Benign essential HTN   . Benign prostatic hyperplasia with urinary retention   . Cerebrovascular accident (CVA) ([place]   . CKD (chronic kidney disease), stage III   . Diabetes ([place] 03/20/2018  . Diabetes mellitus type 2 in nonobese (HCC)   . Essential hypertension 03/20/2018  . Former tobacco use   . H/O ulcerative colitis 1993  . Hypertension   . Hypokalemia   . Leukocytosis   . New onset atrial fibrillation ([facility]   . Osteomyelitis ([facility]    a. 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  .Marland KitchenPAF (paroxysmal atrial fibrillation) ([facility]    a. dx 03/2018 in setting of stroke, osteomyelitis.  . Sepsis ([facility] 03/20/2018  . Stage 3 chronic kidney disease    pt/family unaware  . Status post transmetatarsal amputation of left foot ([facility]   . Stroke ([facility] 03/2018  . Stroke-like episode ([facility] s/p IV tPA 03/16/2018  . Tachycardia 03/20/2018  . Wrist fracture    Left    Tobacco History: Social History   Tobacco Use  Smoking Status Former Smoker  . Packs/day: 1.00  . Years: 20.00  . Pack years: 20.00  . Types: Cigarettes  . Start date: 1963  . Quit date: 167 . Years since quitting: 36.7  Smokeless Tobacco Never Used   Counseling given: Not Answered   Outpatient Medications Prior to Visit  Medication Sig Dispense Refill  . albuterol (VENTOLIN HFA) 108 (90 Base) MCG/ACT inhaler Inhale 2 puffs into the  lungs every 6 (six) hours as needed for wheezing or shortness of breath. 24 g 1  . diltiazem (CARDIZEM CD) 120 MG 24 hr capsule TAKE 1 CAPSULE DAILY (DECREASE IN DOSE) 90 capsule 3  . ELIQUIS 5 MG TABS tablet TAKE 1 TABLET TWICE A DAY 180 tablet 1  . FLUoxetine (PROZAC) 20 MG capsule Take 20 mg by mouth daily.    . furosemide (LASIX) 20 MG tablet TAKE 1 TABLET DAILY. YOU MAY TAKE AN EXTRA 20 MG TABLET FOR INCREASED SWELLING OR WEIGHT GAIN 90 tablet 3  . insulin glargine (LANTUS) 100 UNIT/ML injection Inject 38 Units into the skin 2  (two) times daily.    . mesalamine (LIALDA) 1.2 g EC tablet Take 4.8 g by mouth daily.    . metoprolol succinate (TOPROL-XL) 50 MG 24 hr tablet Take 1 tablet (50 mg total) by mouth daily. Take with or immediately following a meal. 90 tablet 3  . Multiple Vitamin (MULTIVITAMIN WITH MINERALS) TABS tablet Take 1 tablet by mouth daily.    . rosuvastatin (CRESTOR) 10 MG tablet Take 5 mg by mouth daily.    . tamsulosin (FLOMAX) 0.4 MG CAPS capsule Take 1 capsule (0.4 mg total) by mouth 2 times daily at 12 noon and 4 pm. 60 capsule 0  . trimethoprim (TRIMPEX) 100 MG tablet Take 100 mg by mouth at bedtime.   11  . umeclidinium bromide (INCRUSE ELLIPTA) 62.5 MCG/INH AEPB Inhale 1 puff into the lungs daily. 30 each 11  . umeclidinium bromide (INCRUSE ELLIPTA) 62.5 MCG/INH AEPB Inhale 1 puff into the lungs daily. 90 each 1   No facility-administered medications prior to visit.     Review of Systems:   Constitutional:   No  weight loss, night sweats,  Fevers, chills, fatigue, or  lassitude.  HEENT:   No headaches,  Difficulty swallowing,  Tooth/dental problems, or  Sore throat,                No sneezing, itching, ear ache, nasal congestion, post nasal drip,   CV:  No chest pain,  Orthopnea, PND, swelling in lower extremities, anasarca, dizziness, palpitations, syncope.   GI  No heartburn, indigestion, abdominal pain, nausea, vomiting, diarrhea, change in bowel habits, loss of appetite, bloody stools.   Resp:    No excess mucus, no productive cough,  No non-productive cough,  No coughing up of blood.  No change in color of mucus.  No wheezing.  No chest wall deformity  Skin: no rash or lesions.  GU: no dysuria, change in color of urine, no urgency or frequency.  No flank pain, no hematuria   MS:  No joint pain or swelling.  No decreased range of motion.  No back pain.    Physical Exam  BP 120/60 (BP Location: Left Arm, Cuff Size: Normal)   Pulse 67   Temp (!) 97.2 F (36.2 C) (Temporal)    Ht 6' 1"  (1.854 m)   Wt 251 lb 9.6 oz (114.1 kg)   SpO2 93% Comment: RA  BMI 33.19 kg/m   GEN: A/Ox3; pleasant , NAD, well nourished    HEENT:  [place]/AT,   , NOSE-clear, THROAT-clear, no lesions, no postnasal drip or exudate noted.   NECK:  Supple w/ fair ROM; no JVD; normal carotid impulses w/o bruits; no thyromegaly or nodules palpated; no lymphadenopathy.    RESP  Clear  P & A; w/o, wheezes/ rales/ or rhonchi. no accessory muscle use, no dullness to percussion  CARD:  RRR, no m/r/g, no peripheral edema, pulses intact, no cyanosis or clubbing.  GI:   Soft & nt; nml bowel sounds; no organomegaly or masses detected.   Musco: Warm bil, no deformities or joint swelling noted.   Neuro: alert, no focal deficits noted.    Skin: Warm, no lesions or rashes    Lab Results:  CBC     Imaging: No results found.    PFT Results Latest Ref Rng & Units 06/26/2020  FVC-Pre L 4.00  FVC-Predicted Pre % 84  FVC-Post L 4.31  FVC-Predicted Post % 91  Pre FEV1/FVC % % 55  Post FEV1/FCV % % 53  FEV1-Pre L 2.18  FEV1-Predicted Pre % 63  FEV1-Post L 2.29  DLCO uncorrected ml/min/mmHg 13.67  DLCO UNC% % 50  DLCO corrected ml/min/mmHg 13.67  DLCO COR %Predicted % 50  DLVA Predicted % 53  TLC L 7.96  TLC % Predicted % 104  RV % Predicted % 147    No results found for: NITRICOXIDE      Assessment & Plan:   COPD (chronic obstructive pulmonary disease) (HCC) COPD with emphysema-clinical improvement with Incruse.  Patient is encouraged to continue with his physical activities as tolerated. Continue on Incruse.  Plan  Patient Instructions   Continue on INCRUSE 1 puff daily, rinse after use.  Activity as tolerated.  Follow up with Dr. Chase Caller in 3 months and As needed   HRCT Chest March 2022.      Postinflammatory pulmonary fibrosis ([place]) Most likely some postinflammatory fibrosis from COVID-16 August 2019.  We will repeat high-resolution CT chest around March April  2022. Pulmonary function testing shows minimal restriction and a decreased DLCO most likely secondary to some postinflammatory fibrosis and emphysema. Patient is quite active despite moderate airflow obstruction.  Plan    Patient Instructions   Continue on INCRUSE 1 puff daily, rinse after use.  Activity as tolerated.  Follow up with Dr. Chase Caller in 3 months and As needed   HRCT Chest March 2022.         Rexene Edison, NP 06/26/2020

## 2020-06-26 NOTE — Assessment & Plan Note (Signed)
Most likely some postinflammatory fibrosis from COVID-16 August 2019.  We will repeat high-resolution CT chest around March April 2022. Pulmonary function testing shows minimal restriction and a decreased DLCO most likely secondary to some postinflammatory fibrosis and emphysema. Patient is quite active despite moderate airflow obstruction.  Plan    Patient Instructions   Continue on INCRUSE 1 puff daily, rinse after use.  Activity as tolerated.  Follow up with Dr. Chase Caller in 3 months and As needed   HRCT Chest March 2022.

## 2020-06-26 NOTE — Patient Instructions (Addendum)
  Continue on INCRUSE 1 puff daily, rinse after use.  Activity as tolerated.  Follow up with Dr. Chase Caller in 3 months and As needed   HRCT Chest March 2022.

## 2020-06-26 NOTE — Progress Notes (Signed)
PFT done today. 

## 2020-06-26 NOTE — Assessment & Plan Note (Addendum)
COPD with emphysema-clinical improvement with Incruse.  Patient is encouraged to continue with his physical activities as tolerated. Continue on Incruse.  Plan  Patient Instructions   Continue on INCRUSE 1 puff daily, rinse after use.  Activity as tolerated.  Follow up with Dr. Chase Caller in 3 months and As needed   HRCT Chest March 2022.

## 2020-07-05 DIAGNOSIS — Z23 Encounter for immunization: Secondary | ICD-10-CM | POA: Diagnosis not present

## 2020-07-07 DIAGNOSIS — Z1159 Encounter for screening for other viral diseases: Secondary | ICD-10-CM | POA: Diagnosis not present

## 2020-07-10 DIAGNOSIS — D123 Benign neoplasm of transverse colon: Secondary | ICD-10-CM | POA: Diagnosis not present

## 2020-07-10 DIAGNOSIS — K648 Other hemorrhoids: Secondary | ICD-10-CM | POA: Diagnosis not present

## 2020-07-10 DIAGNOSIS — K5730 Diverticulosis of large intestine without perforation or abscess without bleeding: Secondary | ICD-10-CM | POA: Diagnosis not present

## 2020-07-10 DIAGNOSIS — K5190 Ulcerative colitis, unspecified, without complications: Secondary | ICD-10-CM | POA: Diagnosis not present

## 2020-07-10 DIAGNOSIS — K635 Polyp of colon: Secondary | ICD-10-CM | POA: Diagnosis not present

## 2020-07-10 DIAGNOSIS — K5289 Other specified noninfective gastroenteritis and colitis: Secondary | ICD-10-CM | POA: Diagnosis not present

## 2020-07-10 DIAGNOSIS — K5520 Angiodysplasia of colon without hemorrhage: Secondary | ICD-10-CM | POA: Diagnosis not present

## 2020-07-15 DIAGNOSIS — D123 Benign neoplasm of transverse colon: Secondary | ICD-10-CM | POA: Diagnosis not present

## 2020-07-15 DIAGNOSIS — K5289 Other specified noninfective gastroenteritis and colitis: Secondary | ICD-10-CM | POA: Diagnosis not present

## 2020-07-15 DIAGNOSIS — K635 Polyp of colon: Secondary | ICD-10-CM | POA: Diagnosis not present

## 2020-08-16 DIAGNOSIS — Z23 Encounter for immunization: Secondary | ICD-10-CM | POA: Diagnosis not present

## 2020-08-27 ENCOUNTER — Other Ambulatory Visit: Payer: Self-pay | Admitting: Cardiovascular Disease

## 2020-09-04 DIAGNOSIS — F3341 Major depressive disorder, recurrent, in partial remission: Secondary | ICD-10-CM | POA: Diagnosis not present

## 2020-09-04 DIAGNOSIS — D6869 Other thrombophilia: Secondary | ICD-10-CM | POA: Diagnosis not present

## 2020-09-04 DIAGNOSIS — E7800 Pure hypercholesterolemia, unspecified: Secondary | ICD-10-CM | POA: Diagnosis not present

## 2020-09-04 DIAGNOSIS — I700 Atherosclerosis of aorta: Secondary | ICD-10-CM | POA: Diagnosis not present

## 2020-09-04 DIAGNOSIS — Z1389 Encounter for screening for other disorder: Secondary | ICD-10-CM | POA: Diagnosis not present

## 2020-09-04 DIAGNOSIS — I10 Essential (primary) hypertension: Secondary | ICD-10-CM | POA: Diagnosis not present

## 2020-09-04 DIAGNOSIS — Z0000 Encounter for general adult medical examination without abnormal findings: Secondary | ICD-10-CM | POA: Diagnosis not present

## 2020-09-04 DIAGNOSIS — Z86010 Personal history of colonic polyps: Secondary | ICD-10-CM | POA: Diagnosis not present

## 2020-09-04 DIAGNOSIS — I4891 Unspecified atrial fibrillation: Secondary | ICD-10-CM | POA: Diagnosis not present

## 2020-09-04 DIAGNOSIS — E1169 Type 2 diabetes mellitus with other specified complication: Secondary | ICD-10-CM | POA: Diagnosis not present

## 2020-09-15 ENCOUNTER — Other Ambulatory Visit: Payer: Self-pay | Admitting: Cardiovascular Disease

## 2020-10-03 ENCOUNTER — Other Ambulatory Visit: Payer: Self-pay | Admitting: Cardiovascular Disease

## 2020-10-03 NOTE — Telephone Encounter (Signed)
Eliquis 25m refill request received. Patient is 80years old, weight-114.1kg, Crea-1.76 [place] and last seen by MErmalinda Barrioson 04/30/2020. Dose is appropriate based on dosing criteria. Will send in refill to requested pharmacy.

## 2020-10-08 DIAGNOSIS — N130 Hydronephrosis with ureteropelvic junction obstruction: Secondary | ICD-10-CM | POA: Diagnosis not present

## 2020-10-22 DIAGNOSIS — E119 Type 2 diabetes mellitus without complications: Secondary | ICD-10-CM | POA: Diagnosis not present

## 2020-10-22 DIAGNOSIS — H25813 Combined forms of age-related cataract, bilateral: Secondary | ICD-10-CM | POA: Diagnosis not present

## 2020-10-30 DIAGNOSIS — N130 Hydronephrosis with ureteropelvic junction obstruction: Secondary | ICD-10-CM | POA: Diagnosis not present

## 2020-10-30 DIAGNOSIS — R3914 Feeling of incomplete bladder emptying: Secondary | ICD-10-CM | POA: Diagnosis not present

## 2020-10-30 DIAGNOSIS — R3912 Poor urinary stream: Secondary | ICD-10-CM | POA: Diagnosis not present

## 2020-11-28 ENCOUNTER — Telehealth: Payer: Self-pay | Admitting: Internal Medicine

## 2020-11-28 NOTE — Telephone Encounter (Signed)
Try Spiriva Respimat 2.5 dosing at 2 puffs once daily   No Known Allergies

## 2020-11-28 NOTE — Telephone Encounter (Signed)
LMTCB for the pt 

## 2020-11-28 NOTE — Telephone Encounter (Signed)
Fax received from pt's insurance stating that Incruse Ellipta is no longer covered on formulary. They will not continue to pay for this drug after the pt has received the maximum 30 days temporary supply that they are required to cover, unless we obtain a formulary exception.  In order for patient to receive coverage under insurance plan, we need to wither choose an alternative medication that is on the pt's drug formulary or request a coverage review if special circumstances require that pt continue on this medication.  Covered inhalers are: Anoro Ellipta Breo Ellipta Symbicort Trelegy Ellipta Advair Diskus Serevent Diskus Spiriva Handihaler Atrovent HFA Spiriva Respimat Stiolto Respimat   MR, please advise on this.

## 2020-12-02 DIAGNOSIS — H35371 Puckering of macula, right eye: Secondary | ICD-10-CM | POA: Diagnosis not present

## 2020-12-02 DIAGNOSIS — H18413 Arcus senilis, bilateral: Secondary | ICD-10-CM | POA: Diagnosis not present

## 2020-12-02 DIAGNOSIS — H2513 Age-related nuclear cataract, bilateral: Secondary | ICD-10-CM | POA: Diagnosis not present

## 2020-12-02 DIAGNOSIS — H25043 Posterior subcapsular polar age-related cataract, bilateral: Secondary | ICD-10-CM | POA: Diagnosis not present

## 2020-12-02 DIAGNOSIS — H2512 Age-related nuclear cataract, left eye: Secondary | ICD-10-CM | POA: Diagnosis not present

## 2020-12-02 DIAGNOSIS — H25013 Cortical age-related cataract, bilateral: Secondary | ICD-10-CM | POA: Diagnosis not present

## 2020-12-02 MED ORDER — SPIRIVA RESPIMAT 2.5 MCG/ACT IN AERS: 2.0000 | INHALATION_SPRAY | Freq: Every day | RESPIRATORY_TRACT | 6 refills | Status: DC

## 2020-12-02 NOTE — Telephone Encounter (Signed)
I have LM on VM to the pt to call back.  Spiriva has been sent to the pharmacy to replace the Incruse due to insurance.

## 2020-12-03 NOTE — Telephone Encounter (Signed)
Can try stiolto if spiriva did not work in past. 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

## 2020-12-03 NOTE — Telephone Encounter (Signed)
Patient is returning phone call. Patient phone number is [phone].

## 2020-12-03 NOTE — Telephone Encounter (Signed)
Call made to patient, confirmed DOB. Made aware of MR recommendations. Patient states he would like to use up the remainder of the incruse he has left then he will switch to spiriva. He states he does not feel the spiriva helped the last time he tried it but he will try the remainder of hwat he has at home and f/u with [place] in a few weeks.   Will route to MR as FYI.   Nothing further needed at this time.

## 2020-12-04 NOTE — Telephone Encounter (Signed)
ATC patient unable to reach LM to call back office (x1)  

## 2020-12-08 DIAGNOSIS — L03116 Cellulitis of left lower limb: Secondary | ICD-10-CM | POA: Diagnosis not present

## 2020-12-08 DIAGNOSIS — E1169 Type 2 diabetes mellitus with other specified complication: Secondary | ICD-10-CM | POA: Diagnosis not present

## 2020-12-10 MED ORDER — STIOLTO RESPIMAT 2.5-2.5 MCG/ACT IN AERS: 2.0000 | INHALATION_SPRAY | Freq: Every day | RESPIRATORY_TRACT | 0 refills | Status: DC

## 2020-12-10 NOTE — Telephone Encounter (Signed)
Called and spoke with pt letting him know that MR said we could give him a sample of Stiolto for him to try to see if this works better for him and he verbalized understanding. Sample placed up front at office for pt. Nothing further needed.

## 2020-12-10 NOTE — Telephone Encounter (Signed)
Pt returning a phone call. Pt can be reached at [phone].

## 2020-12-11 ENCOUNTER — Encounter: Payer: Self-pay | Admitting: Orthopedic Surgery

## 2020-12-11 ENCOUNTER — Ambulatory Visit (INDEPENDENT_AMBULATORY_CARE_PROVIDER_SITE_OTHER): Payer: Medicare Other | Admitting: Physician Assistant

## 2020-12-11 ENCOUNTER — Other Ambulatory Visit: Payer: Self-pay

## 2020-12-11 DIAGNOSIS — M86272 Subacute osteomyelitis, left ankle and foot: Secondary | ICD-10-CM | POA: Diagnosis not present

## 2020-12-11 NOTE — Progress Notes (Signed)
Office Visit Note   Patient: Kevin [name]           Date of Birth: Oct 01, 1940           MRN: [id number] Visit Date: 12/11/2020              Requested by: [name], MD [address] [place],  [place] [address] PCP: [name], MD  No chief complaint on file.     HPI: Patient is a pleasant 80 year old gentleman who is 2-1/2 years status post revision left transmetatarsal amputation.  He was in the shower and noticed a blister that ruptured and caused some blood about a week ago.  He now has formed a callus.  There is no drainage.  He is extremely concerned as his foot infection caused him in severe sepsis when it occurred.  He thinks this is been aggravated by walking on the treadmill  Assessment & Plan: Visit Diagnoses: No diagnosis found.  Plan: Patient will finish his Bactrim though I do not think this is infected.  Should work on Achilles stretching and talk to him about how to do this.  Have given him an offloading doughnut to use in his shoe when he is on the treadmill.  We will follow-up in 2 weeks or sooner if any concerns  Follow-Up Instructions: No follow-ups on file.   Ortho Exam  Patient is alert, oriented, no adenopathy, well-dressed, normal affect, normal respiratory effort. Examination of his left forefoot demonstrates callus beneath the first metatarsal.  There is no drainage no surrounding cellulitis there is also an adjacent small blood blister.  After verbal consent was obtained this was all debrided to a healthy service I could not appreciate any purulent drainage.  He has a very stiff dorsiflexion and cannot even come to neutral  Imaging: No results found. No images are attached to the encounter.  Labs: Lab Results  Component Value Date   HGBA1C 8.7 (H) 08/20/2019   HGBA1C 10.6 (H) 12/17/2018   HGBA1C 7.6 (H) 03/17/2018   CRP 0.8 08/24/2019   CRP <0.8 08/23/2019   CRP 2.3 (H) 08/22/2019   REPTSTATUS 08/23/2019 FINAL  08/20/2019   GRAMSTAIN  03/17/2018    MODERATE WBC PRESENT, PREDOMINANTLY PMN MODERATE GRAM POSITIVE COCCI FEW GRAM NEGATIVE RODS    CULT >=100,000 COLONIES/mL ENTEROCOCCUS FAECALIS (A) 08/20/2019   LABORGA ENTEROCOCCUS FAECALIS (A) 08/20/2019     Lab Results  Component Value Date   ALBUMIN 3.3 (L) 08/24/2019   ALBUMIN 3.2 (L) 08/23/2019   ALBUMIN 3.4 (L) 08/22/2019    Lab Results  Component Value Date   MG 2.1 08/24/2019   MG 2.1 08/23/2019   MG 2.1 08/22/2019   No results found for: VD25OH  No results found for: PREALBUMIN CBC EXTENDED Latest Ref Rng & Units 08/24/2019 08/23/2019 08/22/2019  WBC 4.0 - 10.5 K/uL 11.9(H) 10.7(H) 10.2  RBC 4.22 - 5.81 MIL/uL 4.50 4.82 4.73  HGB 13.0 - 17.0 g/dL 13.6 14.5 14.4  HCT 39.0 - 52.0 % 40.2 43.5 42.8  PLT 150 - 400 K/uL 275 235 241  NEUTROABS 1.7 - 7.7 K/uL 8.7(H) 8.5(H) 7.3  LYMPHSABS 0.7 - 4.0 K/uL 2.0 1.5 1.8     There is no height or weight on file to calculate BMI.  Orders:  No orders of the defined types were placed in this encounter.  No orders of the defined types were placed in this encounter.    Procedures: No procedures performed  Clinical  Data: No additional findings.  ROS:  All other systems negative, except as noted in the HPI. Review of Systems  Objective: Vital Signs: There were no vitals taken for this visit.  Specialty Comments:  No specialty comments available.  PMFS History: Patient Active Problem List   Diagnosis Date Noted  . COPD (chronic obstructive pulmonary disease) ([place]) 06/26/2020  . 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 Chronic kidney disease, stage 3b ([place]) 08/20/2019  . Acute respiratory failure with hypoxia ([place]) 08/20/2019  . Pneumonia due to COVID-19 virus 08/19/2019  . UTI (urinary tract infection) 12/15/2018  . Chronic diastolic CHF (congestive heart failure) ([place]) 12/15/2018  . HLD (hyperlipidemia) 12/15/2018  . Dehiscence of amputation stump ([place])    . Renal insufficiency 05/06/2018  . Type 2 diabetes mellitus with vascular disease ([place]) 05/06/2018  . Debility 03/31/2018  . Urinary retention with incomplete bladder emptying 03/31/2018  . History of transmetatarsal amputation of left foot ([place])   . 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 ([place])   . Status post transmetatarsal amputation of left foot ([place])   . Postoperative pain   . Benign prostatic hyperplasia with urinary retention   . New onset atrial fibrillation ([place])   . Incontinence of feces   . Cerebrovascular accident (CVA) ([place])   . Benign essential HTN   . Diabetes mellitus type 2 in nonobese (HCC)   . Hypokalemia   . Paroxysmal atrial fibrillation (HCC)   . Leukocytosis   . SIRS (systemic inflammatory response syndrome) (HCC)   . Essential hypertension 03/20/2018  . Tachycardia 03/20/2018  . Sepsis ([place]) 03/20/2018  . Diabetes ([place]) 03/20/2018  . Subacute osteomyelitis, left ankle and foot ([place]) 03/20/2018  . A-fib ([place]) 03/20/2018  . 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 Strain of left tibialis anterior muscle 12/19/2015  . 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 Fracture of radius, distal, left, closed 12/19/2012  . H/O ulcerative colitis 1993   Past Medical History:  Diagnosis Date  . A-fib ([place]) 03/20/2018  . Acute blood loss anemia   . Benign essential HTN   . Benign prostatic hyperplasia with urinary retention   . Cerebrovascular accident (CVA) ([place])   . CKD (chronic kidney disease), stage III   . Diabetes ([place]) 03/20/2018  . Diabetes mellitus type 2 in nonobese (HCC)   . Essential hypertension 03/20/2018  . Former tobacco use   . H/O ulcerative colitis 1993  . Hypertension   . Hypokalemia   . Leukocytosis   . New onset atrial fibrillation ([place])   . Osteomyelitis ([place])    a. 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  [name] (paroxysmal atrial fibrillation) ([place])    a. dx 03/2018 in setting of stroke, osteomyelitis.  .  Sepsis ([place]) 03/20/2018  . Stage 3 chronic kidney disease    pt/family unaware  . Status post transmetatarsal amputation of left foot ([place])   . Stroke ([place]) 03/2018  . 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 Tachycardia 03/20/2018  . Wrist fracture    Left    Family History  Problem Relation Age of Onset  . Hypertension Father     Past Surgical History:  Procedure Laterality Date  . AMPUTATION Left 03/24/2018   Procedure: Transmetatarsal Amputation Left Foot;  Surgeon: [name], MD;  Location: [facility];  Service: Orthopedics;  Laterality: Left;  . CARDIOVERSION N/A 05/05/2018   Procedure: CARDIOVERSION;  Surgeon: [name], MD;  Location: [facility];  Service: Cardiovascular;  Laterality:  N/A;  . COLONOSCOPY  12/2017   benign, remission from ulcerative colitis in 90s  . I & D EXTREMITY Left 03/17/2018   Procedure: IRRIGATION AND DEBRIDEMENT FOOT;  Surgeon: [name], MD;  Location: [facility];  Service: Orthopedics;  Laterality: Left;  . STUMP REVISION Left 05/10/2018   Procedure: REVISION LEFT TRANSMETATARSAL AMPUTATION;  Surgeon: [name], MD;  Location: [facility];  Service: Orthopedics;  Laterality: Left;   Social History   Occupational History  . Not on file  Tobacco Use  . Smoking status: Former Smoker    Packs/day: 1.00    Years: 20.00    Pack years: 20.00    Types: Cigarettes    Start date: 1963    Quit date: 1985    Years since quitting: 37.2  . Smokeless tobacco: Never Used  Vaping Use  . Vaping Use: Never used  Substance and Sexual Activity  . Alcohol use: Yes    Alcohol/week: 4.0 standard drinks    Types: 4 Cans of beer per week    Comment: social   . Drug use: Never  . Sexual activity: Not on file

## 2020-12-23 ENCOUNTER — Telehealth: Payer: Self-pay | Admitting: Adult Health

## 2020-12-23 ENCOUNTER — Ambulatory Visit
Admission: RE | Admit: 2020-12-23 | Discharge: 2020-12-23 | Disposition: A | Discharge status: Home / Self Care | Payer: Medicare Other | Source: Ambulatory Visit | Attending: Adult Health | Admitting: Adult Health

## 2020-12-23 DIAGNOSIS — J439 Emphysema, unspecified: Secondary | ICD-10-CM

## 2020-12-23 DIAGNOSIS — Z8616 Personal history of COVID-19: Secondary | ICD-10-CM

## 2020-12-23 DIAGNOSIS — J432 Centrilobular emphysema: Secondary | ICD-10-CM | POA: Diagnosis not present

## 2020-12-23 DIAGNOSIS — R0600 Dyspnea, unspecified: Secondary | ICD-10-CM | POA: Diagnosis not present

## 2020-12-23 DIAGNOSIS — R0609 Other forms of dyspnea: Secondary | ICD-10-CM

## 2020-12-23 DIAGNOSIS — I2510 Atherosclerotic heart disease of native coronary artery without angina pectoris: Secondary | ICD-10-CM | POA: Diagnosis not present

## 2020-12-23 DIAGNOSIS — J8410 Pulmonary fibrosis, unspecified: Secondary | ICD-10-CM | POA: Diagnosis not present

## 2020-12-23 NOTE — Telephone Encounter (Signed)
CT chest shows abnormal areas.  Patient has not been seen since September 2021.  Will need an office visit to discuss and decide what next step is.  Tried to call patient to discussed test results.  Please set up an office visit with myself or Dr. Chase Caller to discuss results.

## 2020-12-23 NOTE — Telephone Encounter (Signed)
Received call report from [place] with [facility] Radiology on patient's HRCT done on 12/23/20. Tammy, please review the result/impression copied below:   IMPRESSION: 1. New indistinct spiculated subpleural peripheral right upper lobe 1.5 cm pulmonary nodule with new adjacent subsolid 1.1 cm anterior right upper lobe nodule. Primary bronchogenic carcinoma to be excluded. Multidisciplinary thoracic oncology consultation and PET-CT suggested. 2. Indistinct subsolid 2.0 cm posterior left upper lobe nodule abutting and slightly distorting the left major fissure, mildly increased in size and density, cannot exclude indolent primary bronchogenic adenocarcinoma. 3. No thoracic adenopathy. 4. 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 Findings are categorized as probable UIP per consensus guidelines: Diagnosis of Idiopathic Pulmonary Fibrosis: An Official ATS/ERS/JRS/ALAT Clinical Practice Guideline. [place], Iss 5, [phone], May 28 2017. 5. Three-vessel coronary atherosclerosis. 6. Stable bilateral adrenal adenomas. 7. Aortic Atherosclerosis (ICD10-I70.0) and Emphysema (ICD10-J43.9).   Please advise, thank you.

## 2020-12-24 ENCOUNTER — Telehealth: Payer: Self-pay | Admitting: Cardiovascular Disease

## 2020-12-24 DIAGNOSIS — R8279 Other abnormal findings on microbiological examination of urine: Secondary | ICD-10-CM | POA: Diagnosis not present

## 2020-12-24 DIAGNOSIS — R310 Gross hematuria: Secondary | ICD-10-CM | POA: Diagnosis not present

## 2020-12-24 DIAGNOSIS — R3914 Feeling of incomplete bladder emptying: Secondary | ICD-10-CM | POA: Diagnosis not present

## 2020-12-24 NOTE — Telephone Encounter (Signed)
LMTCB for the pt- will try once more after this before sending letter.

## 2020-12-24 NOTE — Telephone Encounter (Signed)
Pt c/o medication issue:  1. Name of Medication: ELIQUIS 5 MG TABS tablet  2. How are you currently taking this medication (dosage and times per day)? TAKE 1 TABLET TWICE A DAY  3. Are you having a reaction (difficulty breathing--STAT)? No  4. What is your medication issue? Pt had a bladder scan today and has a gross hematoma/ pt is asking can he hold the Eliquis for 3 days

## 2020-12-24 NOTE — Telephone Encounter (Signed)
Noted blood in urine for last 3 days.  Today he showed his wife. The water was still clear enough, but definitely blood present.  In urinal the urine was completely dark red.   Went to urology today had bladder scan today and UA, hgb, creatine.    He was told by urology PA that he needed to hold Eliquis for 3 days. And they have plans for CT at this point.  Sent home with instructions to straight cath once every night.  Hx of retaining urine which he st cath'd for in past.  This cleared without intervention per wife, no hematuria then.    She does not want him to hold Eliquis without checking with Dr. [name].  He had a colonoscopy last year and was instructed to hold Elqiuis only one day due to his stroke risk.   CHA2DS2-VASc score of 5 per last ov 04/2020.  I adv and his wife agreed that pt should TAKE the Eliquis dose as scheduled this evening.  Monitor hematuria over night and tomorrow am.  I will call her in am for an update and to provide rec from Dr. [name].  I adv that normally we would recommend to follow the rec from the urologist but w his risk being high and she is strongly interested in cardiology opinion also, he will take the next dose this evening.    Adv to take him to ER/urgent care for any significant increase in bleeding or inability to st cath himself or void or any lower abd discomfort. Pt's wife, Lelan Pons, thanked me for the call and assistance.

## 2020-12-25 NOTE — Telephone Encounter (Signed)
I agree that he should hold his Eliquis for three days and follow urology plan for workup. Kevin [name]

## 2020-12-25 NOTE — Telephone Encounter (Signed)
Patient's wife has been notified.  She reports pt urine is cola colored overnight.  He will hold Eliquis for 3 days and follow urology plan for workup.

## 2020-12-26 ENCOUNTER — Ambulatory Visit (INDEPENDENT_AMBULATORY_CARE_PROVIDER_SITE_OTHER): Payer: Medicare Other | Admitting: Physician Assistant

## 2020-12-26 ENCOUNTER — Encounter: Payer: Self-pay | Admitting: Physician Assistant

## 2020-12-26 DIAGNOSIS — M86272 Subacute osteomyelitis, left ankle and foot: Secondary | ICD-10-CM

## 2020-12-26 NOTE — Progress Notes (Signed)
ATC x1, left vm for patient to return call to schedule an OV with MR or Tammy to discuss CT scan.

## 2020-12-26 NOTE — Telephone Encounter (Signed)
Called and spoke with patient. Attempted to get him scheduled for next week but he stated that he would be out of town next week. Was able to get him scheduled with TP on 01/06/21 at 4pm. He verbalized understanding.   Nothing further needed at time of call.

## 2020-12-26 NOTE — Progress Notes (Signed)
Office Visit Note   Patient: Kevin [name]           Date of Birth: [birth year]           MRN: [id number] Visit Date: 12/26/2020              Requested by: [name], MD [address] [place],  [place] [address] PCP: [name], MD  No chief complaint on file.     HPI: Patient presents today for follow-up on his ulcer on the plantar surface of his foot.  He is 2-1/2 years status post transmetatarsal amputation and revision.  His wife has been doing daily care on his foot and keeping it covered with a Band-Aid.  I did provide him with a donut at his last visit however this has become flattened.  Overall he feels things have improved a little bit  Assessment & Plan: Visit Diagnoses: No diagnosis found.  Plan: Continue with supportive shoes and offloading doughnut.  Cleanse daily with Dial soap and water.  Patient will follow up with [name] Given in 2 weeks  Follow-Up Instructions: No follow-ups on file.   Ortho Exam  Patient is alert, oriented, no adenopathy, well-dressed, normal affect, normal respiratory effort. Examination status post transmetatarsal amputation no swelling no erythema no redness pulses is palpable.  He has a 2 cm round grade 1 ulcer.  There is some callus surrounding this which was debrided to healthy tissue.  It does not probe deeply it has healthy granulation tissue no foul odor just some bloody drainage no ascending cellulitis or signs of infection  Imaging: No results found. No images are attached to the encounter.  Labs: Lab Results  Component Value Date   HGBA1C 8.7 (H) 08/20/2019   HGBA1C 10.6 (H) 12/17/2018   HGBA1C 7.6 (H) 03/17/2018   CRP 0.8 08/24/2019   CRP <0.8 08/23/2019   CRP 2.3 (H) 08/22/2019   REPTSTATUS 08/23/2019 FINAL 08/20/2019   GRAMSTAIN  03/17/2018    MODERATE WBC PRESENT, PREDOMINANTLY PMN MODERATE GRAM POSITIVE COCCI FEW GRAM NEGATIVE RODS    CULT >=100,000 COLONIES/mL ENTEROCOCCUS FAECALIS (A)  08/20/2019   LABORGA ENTEROCOCCUS FAECALIS (A) 08/20/2019     Lab Results  Component Value Date   ALBUMIN 3.3 (L) 08/24/2019   ALBUMIN 3.2 (L) 08/23/2019   ALBUMIN 3.4 (L) 08/22/2019    Lab Results  Component Value Date   MG 2.1 08/24/2019   MG 2.1 08/23/2019   MG 2.1 08/22/2019   No results found for: VD25OH  No results found for: PREALBUMIN CBC EXTENDED Latest Ref Rng & Units 08/24/2019 08/23/2019 08/22/2019  WBC 4.0 - 10.5 K/uL 11.9(H) 10.7(H) 10.2  RBC 4.22 - 5.81 MIL/uL 4.50 4.82 4.73  HGB 13.0 - 17.0 g/dL 13.6 14.5 14.4  HCT 39.0 - 52.0 % 40.2 43.5 42.8  PLT 150 - 400 K/uL 275 235 241  NEUTROABS 1.7 - 7.7 K/uL 8.7(H) 8.5(H) 7.3  LYMPHSABS 0.7 - 4.0 K/uL 2.0 1.5 1.8     There is no height or weight on file to calculate BMI.  Orders:  No orders of the defined types were placed in this encounter.  No orders of the defined types were placed in this encounter.    Procedures: No procedures performed  Clinical Data: No additional findings.  ROS:  All other systems negative, except as noted in the HPI. Review of Systems  Objective: Vital Signs: There were no vitals taken for this visit.  Specialty Comments:  No  specialty comments available.  PMFS History: Patient Active Problem List   Diagnosis Date Noted  . COPD (chronic obstructive pulmonary disease) ([place]) 06/26/2020  . Postinflammatory pulmonary fibrosis ([place]) 06/26/2020  . Chronic kidney disease, stage 3b ([place]) 08/20/2019  . Acute respiratory failure with hypoxia ([place]) 08/20/2019  . Pneumonia due to COVID-19 virus 08/19/2019  . UTI (urinary tract infection) 12/15/2018  . Chronic diastolic CHF (congestive heart failure) ([place]) 12/15/2018  . HLD (hyperlipidemia) 12/15/2018  . Dehiscence of amputation stump ([place])   . Renal insufficiency 05/06/2018  . Type 2 diabetes mellitus with vascular disease ([place]) 05/06/2018  . Debility 03/31/2018  . Urinary retention with incomplete bladder emptying  03/31/2018  . History of transmetatarsal amputation of left foot ([place])   . 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 ([place])   . Status post transmetatarsal amputation of left foot ([place])   . Postoperative pain   . Benign prostatic hyperplasia with urinary retention   . New onset atrial fibrillation ([place])   . Incontinence of feces   . Cerebrovascular accident (CVA) ([place])   . Benign essential HTN   . Diabetes mellitus type 2 in nonobese (HCC)   . Hypokalemia   . Paroxysmal atrial fibrillation (HCC)   . Leukocytosis   . SIRS (systemic inflammatory response syndrome) (HCC)   . Essential hypertension 03/20/2018  . Tachycardia 03/20/2018  . Sepsis ([place]) 03/20/2018  . Diabetes ([place]) 03/20/2018  . Subacute osteomyelitis, left ankle and foot ([place]) 03/20/2018  . A-fib ([place]) 03/20/2018  . 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 Strain of left tibialis anterior muscle 12/19/2015  . 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 Fracture of radius, distal, left, closed 12/19/2012  . H/O ulcerative colitis 1993   Past Medical History:  Diagnosis Date  . A-fib ([place]) 03/20/2018  . Acute blood loss anemia   . Benign essential HTN   . Benign prostatic hyperplasia with urinary retention   . Cerebrovascular accident (CVA) ([place])   . CKD (chronic kidney disease), stage III ([place])   . Diabetes ([place]) 03/20/2018  . Diabetes mellitus type 2 in nonobese (HCC)   . Essential hypertension 03/20/2018  . Former tobacco use   . H/O ulcerative colitis 1993  . Hypertension   . Hypokalemia   . Leukocytosis   . New onset atrial fibrillation ([place])   . Osteomyelitis ([place])    a. 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  [name] (paroxysmal atrial fibrillation) ([place])    a. dx 03/2018 in setting of stroke, osteomyelitis.  . Sepsis ([place]) 03/20/2018  . Stage 3 chronic kidney disease ([place])    pt/family unaware  . Status post transmetatarsal amputation of left foot ([place])   . Stroke ([place]) 03/2018  .  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 Tachycardia 03/20/2018  . Wrist fracture    Left    Family History  Problem Relation Age of Onset  . Hypertension Father     Past Surgical History:  Procedure Laterality Date  . AMPUTATION Left 03/24/2018   Procedure: Transmetatarsal Amputation Left Foot;  Surgeon: [name], MD;  Location: [facility];  Service: Orthopedics;  Laterality: Left;  . CARDIOVERSION N/A 05/05/2018   Procedure: CARDIOVERSION;  Surgeon: [name], MD;  Location: [facility];  Service: Cardiovascular;  Laterality: N/A;  . COLONOSCOPY  12/2017   benign, remission from ulcerative colitis in 90s  . I & D EXTREMITY Left 03/17/2018   Procedure: IRRIGATION AND DEBRIDEMENT FOOT;  Surgeon: [name],  MD;  Location: [facility];  Service: Orthopedics;  Laterality: Left;  . STUMP REVISION Left 05/10/2018   Procedure: REVISION LEFT TRANSMETATARSAL AMPUTATION;  Surgeon: [name], MD;  Location: [facility];  Service: Orthopedics;  Laterality: Left;   Social History   Occupational History  . Not on file  Tobacco Use  . Smoking status: Former Smoker    Packs/day: 1.00    Years: 20.00    Pack years: 20.00    Types: Cigarettes    Start date: 1963    Quit date: 1985    Years since quitting: 37.2  . Smokeless tobacco: Never Used  Vaping Use  . Vaping Use: Never used  Substance and Sexual Activity  . Alcohol use: Yes    Alcohol/week: 4.0 standard drinks    Types: 4 Cans of beer per week    Comment: social   . Drug use: Never  . Sexual activity: Not on file

## 2020-12-30 NOTE — Progress Notes (Signed)
Patient returned call and spoke with [name], she scheduled him to see Rexene Edison NP on 01/06/21.  Nothing further needed.,

## 2021-01-05 DIAGNOSIS — R310 Gross hematuria: Secondary | ICD-10-CM | POA: Diagnosis not present

## 2021-01-05 DIAGNOSIS — N1330 Unspecified hydronephrosis: Secondary | ICD-10-CM | POA: Diagnosis not present

## 2021-01-05 DIAGNOSIS — N281 Cyst of kidney, acquired: Secondary | ICD-10-CM | POA: Diagnosis not present

## 2021-01-05 DIAGNOSIS — Z85820 Personal history of malignant melanoma of skin: Secondary | ICD-10-CM | POA: Diagnosis not present

## 2021-01-05 DIAGNOSIS — N3289 Other specified disorders of bladder: Secondary | ICD-10-CM | POA: Diagnosis not present

## 2021-01-06 ENCOUNTER — Other Ambulatory Visit: Payer: Self-pay

## 2021-01-06 ENCOUNTER — Ambulatory Visit (INDEPENDENT_AMBULATORY_CARE_PROVIDER_SITE_OTHER): Payer: Medicare Other | Admitting: Adult Health

## 2021-01-06 ENCOUNTER — Encounter: Payer: Self-pay | Admitting: Adult Health

## 2021-01-06 VITALS — BP 140/80 | HR 74 | Temp 97.5°F | Ht 73.0 in | Wt 254.0 lb

## 2021-01-06 DIAGNOSIS — J8410 Pulmonary fibrosis, unspecified: Secondary | ICD-10-CM

## 2021-01-06 DIAGNOSIS — J449 Chronic obstructive pulmonary disease, unspecified: Secondary | ICD-10-CM

## 2021-01-06 DIAGNOSIS — R911 Solitary pulmonary nodule: Secondary | ICD-10-CM | POA: Diagnosis not present

## 2021-01-06 NOTE — Patient Instructions (Addendum)
Set up PET scan to follow -right upper lobe nodule .  Continue on Stiolto 2 puffs daily.  Activity as tolerated.  Follow up with Dr. Chase Caller in 4-6  weeks and As needed

## 2021-01-06 NOTE — Progress Notes (Signed)
@Patient  ID: Kevin [name], male    DOB: 1941/02/10, 80 y.o.   MRN: [id number]  Chief Complaint  Patient presents with  . Follow-up    Referring provider: [name], MD  HPI: 80 year old male former smoker seen for pulmonary consult May 13, 2020 for shortness of breath. 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  PFT September 2021 FEV1 is 67%, ratio 53, FVC 91%, no significant bronchodilator response, DLCO 50%.  01/06/2021 Follow up : COPD  Patient returns for a 18-monthfollow-up.  Patient has underlying COPD with emphysema.  Previous pulmonary function testing showed moderate airflow obstruction.  Patient did have COVID-19 infection in November 2020.  Patient was started on Stiolto.  Feels that it has helped with his breathing.  He was set up for a high-resolution CT chest which was completed December 23, 2020 that showed no pathological adenopathy.  Severe emphysema, mild patchy subpleural reticulation and groundglass opacities in both lungs with some mild traction bronchiectasis.  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  There was also a soft solid 2 cm left upper lobe nodule abutting the left major fissure mildly increased in size and density.  We discussed his CT results.  And need to set up a PET scan to further evaluate this new nodule. Patient says overall breathing is doing some better.  He denies any increased cough or shortness of breath.  Does get winded with heavy activities.  No hemoptysis.  No weight loss.  No Known Allergies  Immunization History  Administered Date(s) Administered  .  Influenza, High Dose Seasonal PF 06/12/2015, 06/29/2016, 07/25/2017, 06/27/2018, 09/11/2019, 11/26/2019, 07/05/2020  . PFIZER(Purple Top)SARS-COV-2 Vaccination 12/27/2019, 01/21/2020, 03/22/2020, 07/07/2020, 08/16/2020    Past Medical History:  Diagnosis Date  . A-fib ([place] 03/20/2018  . Acute blood loss anemia   . Benign essential HTN   . Benign prostatic hyperplasia with urinary retention   . Cerebrovascular accident (CVA) ([place]   . CKD (chronic kidney disease), stage III ([facility]   . Diabetes ([place] 03/20/2018  . Diabetes mellitus type 2 in nonobese (HCC)   . Essential hypertension 03/20/2018  . Former tobacco use   . H/O ulcerative colitis 1993  . Hypertension   . Hypokalemia   . Leukocytosis   . New onset atrial fibrillation (HP[place]   . Osteomyelitis ([place]    a. 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  .Marland KitchenPAF (paroxysmal atrial fibrillation) ([facility]    a. dx 03/2018 in setting of stroke, osteomyelitis.  . Sepsis ([facility] 03/20/2018  . Stage 3 chronic kidney disease ([facility]    pt/family unaware  . Status post transmetatarsal amputation of left foot (HC[place]   . Stroke ([facility] 03/2018  . Stroke-like episode (H[place] s/p IV tPA 03/16/2018  . Tachycardia 03/20/2018  . Wrist fracture    Left    Tobacco History: Social History   Tobacco Use  Smoking Status Former Smoker  . Packs/day: 1.00  . Years: 20.00  . Pack years: 20.00  . Types: Cigarettes  . Start date: 1963  . Quit date: 142 . Years since quitting: 37.3  Smokeless Tobacco Never Used   Counseling given: Not Answered   Outpatient Medications Prior to  Visit  Medication Sig Dispense Refill  . albuterol (VENTOLIN HFA) 108 (90 Base) MCG/ACT inhaler Inhale 2 puffs into the lungs every 6 (six) hours as needed for wheezing or shortness of breath. 24 g 1  . diltiazem (CARDIZEM CD) 120 MG 24 hr capsule TAKE 1 CAPSULE DAILY (DECREASE IN DOSE) 90 capsule 1  . ELIQUIS 5 MG TABS tablet TAKE 1 TABLET TWICE A DAY 180 tablet 2  . FLUoxetine (PROZAC)  20 MG capsule Take 20 mg by mouth daily.    . furosemide (LASIX) 20 MG tablet TAKE 1 TABLET DAILY. YOU MAY TAKE AN EXTRA 20 MG TABLET FOR INCREASED SWELLING OR WEIGHT GAIN 135 tablet 2  . insulin glargine (LANTUS) 100 UNIT/ML injection Inject 38 Units into the skin 2 (two) times daily.    . mesalamine (LIALDA) 1.2 g EC tablet Take 4.8 g by mouth daily.    . metoprolol succinate (TOPROL-XL) 50 MG 24 hr tablet Take 1 tablet (50 mg total) by mouth daily. Take with or immediately following a meal. 90 tablet 3  . Multiple Vitamin (MULTIVITAMIN WITH MINERALS) TABS tablet Take 1 tablet by mouth daily.    . rosuvastatin (CRESTOR) 10 MG tablet Take 5 mg by mouth daily.    . tamsulosin (FLOMAX) 0.4 MG CAPS capsule Take 1 capsule (0.4 mg total) by mouth 2 times daily at 12 noon and 4 pm. 60 capsule 0  . Tiotropium Bromide-Olodaterol (STIOLTO RESPIMAT) 2.5-2.5 MCG/ACT AERS Inhale 2 puffs into the lungs daily. 4 g 0  . trimethoprim (TRIMPEX) 100 MG tablet Take 100 mg by mouth at bedtime.   11  . umeclidinium bromide (INCRUSE ELLIPTA) 62.5 MCG/INH AEPB Inhale 1 puff into the lungs daily. 30 each 11  . umeclidinium bromide (INCRUSE ELLIPTA) 62.5 MCG/INH AEPB Inhale 1 puff into the lungs daily. 90 each 1   No facility-administered medications prior to visit.     Review of Systems:   Constitutional:   No  weight loss, night sweats,  Fevers, chills, + fatigue, or  lassitude.  HEENT:   No headaches,  Difficulty swallowing,  Tooth/dental problems, or  Sore throat,                No sneezing, itching, ear ache, nasal congestion, post nasal drip,   CV:  No chest pain,  Orthopnea, PND, swelling in lower extremities, anasarca, dizziness, palpitations, syncope.   GI  No heartburn, indigestion, abdominal pain, nausea, vomiting, diarrhea, change in bowel habits, loss of appetite, bloody stools.   Resp:    No chest wall deformity  Skin: no rash or lesions.  GU: no dysuria, change in color of urine, no urgency  or frequency.  No flank pain, no hematuria   MS:  No joint pain or swelling.  No decreased range of motion.  No back pain.    Physical Exam  BP 140/80 (BP Location: Left Arm, Cuff Size: Normal)   Pulse 74   Temp (!) 97.5 F (36.4 C)   Ht 6' 1"  (1.854 m)   Wt 254 lb (115.2 kg)   SpO2 97%   BMI 33.51 kg/m   GEN: A/Ox3; pleasant , NAD, well nourished    HEENT:  [place]/AT,    NOSE-clear, THROAT-clear, no lesions, no postnasal drip or exudate noted.   NECK:  Supple w/ fair ROM; no JVD; normal carotid impulses w/o bruits; no thyromegaly or nodules palpated; no lymphadenopathy.    RESP  Clear  P & A; w/o, wheezes/ rales/ or rhonchi.  no accessory muscle use, no dullness to percussion  CARD:  RRR, no m/r/g, no peripheral edema, pulses intact, no cyanosis or clubbing.  GI:   Soft & nt; nml bowel sounds; no organomegaly or masses detected.   Musco: Warm bil, no deformities or joint swelling noted.   Neuro: alert, no focal deficits noted.    Skin: Warm, no lesions or rashes    Lab Results:  CBC   BMET   Imaging: CT Chest High Resolution  Result Date: 12/23/2020 CLINICAL DATA:  Persistent post COVID dyspnea, COVID infection 1 year prior. Former smoker. EXAM: CT CHEST WITHOUT CONTRAST TECHNIQUE: Multidetector CT imaging of the chest was performed following the standard protocol without intravenous contrast. High resolution imaging of the lungs, as well as inspiratory and expiratory imaging, was performed. COMPARISON:  12/24/2019 chest CT. FINDINGS: Cardiovascular: Normal heart size. No significant pericardial effusion/thickening. Three-vessel coronary atherosclerosis. Atherosclerotic nonaneurysmal thoracic aorta. Top-normal caliber main pulmonary artery (3.2 cm diameter). Mediastinum/Nodes: No discrete thyroid nodules. Unremarkable esophagus. No pathologically enlarged axillary, mediastinal or hilar lymph nodes, noting limited sensitivity for the detection of hilar adenopathy on this  noncontrast study. Lungs/Pleura: No pneumothorax. No pleural effusion. Severe centrilobular and paraseptal emphysema with diffuse bronchial wall thickening. New indistinct spiculated subpleural peripheral right upper lobe 1.5 x 1.1 cm pulmonary nodule (series 7/image 42), with new adjacent subsolid 1.1 x 0.9 cm anterior right upper lobe nodule (series 7/image 47). Indistinct sub solid 2.0 x 0.9 cm posterior left upper lobe nodule abutting and slightly distorting the left major fissure (series 7/image 65), previously 1.8 x 0.8 cm, mildly increased in size and density. Solid 0.4 cm anterior left lower lobe nodule (series 7/image 90) is stable and considered benign. No acute consolidative airspace disease, lung masses or additional significant pulmonary nodules. Mild patchy subpleural reticulation and ground-glass opacity in both lungs with associated mild traction bronchiectasis and architectural distortion. There is a basilar gradient to these findings, which have not convincingly progressed in the interval. No significant lobular air trapping or evidence of tracheobronchomalacia on the expiration sequence. No frank honeycombing. Upper abdomen: Simple 1.5 cm lateral segment left liver cyst. Stable 1.3 cm right adrenal nodule with density -2 HU, compatible with an adenoma. Is stable 1.4 cm left adrenal nodule with density 0 HU, compatible with benign adenoma. Musculoskeletal: No aggressive appearing focal osseous lesions. Chronic severe L1 vertebral compression fracture. Marked thoracic spondylosis. IMPRESSION: 1. New indistinct spiculated subpleural peripheral right upper lobe 1.5 cm pulmonary nodule with new adjacent subsolid 1.1 cm anterior right upper lobe nodule. Primary bronchogenic carcinoma to be excluded. Multidisciplinary thoracic oncology consultation and PET-CT suggested. 2. Indistinct subsolid 2.0 cm posterior left upper lobe nodule abutting and slightly distorting the left major fissure, mildly increased  in size and density, cannot exclude indolent primary bronchogenic adenocarcinoma. 3. No thoracic adenopathy. 4. 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 Findings are categorized as probable UIP per consensus guidelines: Diagnosis of Idiopathic Pulmonary Fibrosis: An Official ATS/ERS/JRS/ALAT Clinical Practice Guideline. [place], Iss 5, [phone], May 28 2017. 5. Three-vessel coronary atherosclerosis. 6. Stable bilateral adrenal adenomas. 7. Aortic Atherosclerosis (ICD10-I70.0) and Emphysema (ICD10-J43.9). These results will be called to the ordering clinician or representative by the Radiologist Assistant, and communication documented in the PACS or [employer]. Electronically Signed   By: Ilona Sorrel M.D.   On: 12/23/2020 15:37      PFT Results Latest  Ref Rng & Units 06/26/2020  FVC-Pre L 4.00  FVC-Predicted Pre % 84  FVC-Post L 4.31  FVC-Predicted Post % 91  Pre FEV1/FVC % % 55  Post FEV1/FCV % % 53  FEV1-Pre L 2.18  FEV1-Predicted Pre % 63  FEV1-Post L 2.29  DLCO uncorrected ml/min/mmHg 13.67  DLCO UNC% % 50  DLCO corrected ml/min/mmHg 13.67  DLCO COR %Predicted % 50  DLVA Predicted % 53  TLC L 7.96  TLC % Predicted % 104  RV % Predicted % 147    No results found for: NITRICOXIDE      Assessment & Plan:   No problem-specific Assessment & Plan notes found for this encounter.     Rexene Edison, NP 01/06/2021

## 2021-01-08 ENCOUNTER — Ambulatory Visit (INDEPENDENT_AMBULATORY_CARE_PROVIDER_SITE_OTHER): Payer: Medicare Other | Admitting: Orthopedic Surgery

## 2021-01-08 DIAGNOSIS — Z89432 Acquired absence of left foot: Secondary | ICD-10-CM | POA: Diagnosis not present

## 2021-01-08 DIAGNOSIS — R911 Solitary pulmonary nodule: Secondary | ICD-10-CM | POA: Insufficient documentation

## 2021-01-08 DIAGNOSIS — R918 Other nonspecific abnormal finding of lung field: Secondary | ICD-10-CM | POA: Insufficient documentation

## 2021-01-08 NOTE — Assessment & Plan Note (Signed)
Appears to be stable continue on Stiolto.

## 2021-01-08 NOTE — Assessment & Plan Note (Signed)
High resolution CT chest shows basilar predominant fibrotic interstitial lung disease without frank honeycombing and without interval progression.  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 On follow-up visit once we have his PET scan will have him begin interstitial lung disease work-up with autoimmune or connective tissue lab work.  Plan  Patient Instructions  Set up PET scan to follow -right upper lobe nodule .  Continue on Stiolto 2 puffs daily.  Activity as tolerated.  Follow up with Dr. Chase Caller in 4-6  weeks and As needed

## 2021-01-08 NOTE — Assessment & Plan Note (Signed)
CT chest shows new right upper lobe 1.5 x 1.1 cm pulmonary nodule and new 1.1 x 0.9 right upper lobe nodule and a 2.0 cm left upper lobe nodule slightly increased in size. Patient is a former smoker.  Will need further evaluation to rule out underlying malignancy.   Discussed his results in detail.  Will set up a PET scan.  And decide on next step  Plan  Patient Instructions  Set up PET scan to follow -right upper lobe nodule .  Continue on Stiolto 2 puffs daily.  Activity as tolerated.  Follow up with Dr. Chase Caller in 4-6  weeks and As needed      '

## 2021-01-12 ENCOUNTER — Telehealth: Payer: Self-pay | Admitting: Adult Health

## 2021-01-12 MED ORDER — STIOLTO RESPIMAT 2.5-2.5 MCG/ACT IN AERS: 2.0000 | INHALATION_SPRAY | Freq: Every day | RESPIRATORY_TRACT | 11 refills | Status: DC

## 2021-01-12 MED ORDER — STIOLTO RESPIMAT 2.5-2.5 MCG/ACT IN AERS
2.0000 | INHALATION_SPRAY | Freq: Every day | RESPIRATORY_TRACT | 11 refills | Status: DC
Start: 2021-01-12 — End: 2021-01-12

## 2021-01-12 NOTE — Telephone Encounter (Signed)
Called and spoke with Patient's Wife [name] (DPR). [name] stated Patient is needing a written Stiolto prescription. [name] stated she is going to take prescription and try different pharmacies. [name] states Stiolto cost  $300-$500 and she is going to shop around. [name] ask for [employer] with 1 years refills to be mailed to Patient. Prescription printed and placed in Dr.Ramaswamy's box to be signed 01/13/21.   Message routed to [place], [place]

## 2021-01-13 ENCOUNTER — Encounter: Payer: Self-pay | Admitting: Orthopedic Surgery

## 2021-01-13 NOTE — Progress Notes (Signed)
Office Visit Note   Patient: Kevin [name]           Date of Birth: 1941-04-22           MRN: [id number] Visit Date: 01/08/2021              Requested by: [name], MD [address] [place],  [place] [address] PCP: [name], MD  Chief Complaint  Patient presents with  . Left Foot - Follow-up    Hx revision left trasmet  amputation 04/2018 f/u ulcer       HPI: Patient is a 80 year old gentleman who is status post revision left transmetatarsal amputation.  Patient presents with a new ulcer.  Assessment & Plan: Visit Diagnoses:  1. History of transmetatarsal amputation of left foot ([place])     Plan: Patient was given a prescription for biotech for a custom orthotic spacer and carbon plate.  Follow-Up Instructions: Return in about 2 months (around 03/10/2021).   Ortho Exam  Patient is alert, oriented, no adenopathy, well-dressed, normal affect, normal respiratory effort. Examination of the left foot there is a Wagner grade 1 ulcer beneath the left first metatarsal.  This is 1 cm in diameter after informed consent a 10 blade knife was used to pare the callus without complications there is no exposed bone or tendon no abscess no cellulitis no signs of infection.  Imaging: No results found. No images are attached to the encounter.  Labs: Lab Results  Component Value Date   HGBA1C 8.7 (H) 08/20/2019   HGBA1C 10.6 (H) 12/17/2018   HGBA1C 7.6 (H) 03/17/2018   CRP 0.8 08/24/2019   CRP <0.8 08/23/2019   CRP 2.3 (H) 08/22/2019   REPTSTATUS 08/23/2019 FINAL 08/20/2019   GRAMSTAIN  03/17/2018    MODERATE WBC PRESENT, PREDOMINANTLY PMN MODERATE GRAM POSITIVE COCCI FEW GRAM NEGATIVE RODS    CULT >=100,000 COLONIES/mL ENTEROCOCCUS FAECALIS (A) 08/20/2019   LABORGA ENTEROCOCCUS FAECALIS (A) 08/20/2019     Lab Results  Component Value Date   ALBUMIN 3.3 (L) 08/24/2019   ALBUMIN 3.2 (L) 08/23/2019   ALBUMIN 3.4 (L) 08/22/2019    Lab Results   Component Value Date   MG 2.1 08/24/2019   MG 2.1 08/23/2019   MG 2.1 08/22/2019   No results found for: VD25OH  No results found for: PREALBUMIN CBC EXTENDED Latest Ref Rng & Units 08/24/2019 08/23/2019 08/22/2019  WBC 4.0 - 10.5 K/uL 11.9(H) 10.7(H) 10.2  RBC 4.22 - 5.81 MIL/uL 4.50 4.82 4.73  HGB 13.0 - 17.0 g/dL 13.6 14.5 14.4  HCT 39.0 - 52.0 % 40.2 43.5 42.8  PLT 150 - 400 K/uL 275 235 241  NEUTROABS 1.7 - 7.7 K/uL 8.7(H) 8.5(H) 7.3  LYMPHSABS 0.7 - 4.0 K/uL 2.0 1.5 1.8     There is no height or weight on file to calculate BMI.  Orders:  No orders of the defined types were placed in this encounter.  No orders of the defined types were placed in this encounter.    Procedures: No procedures performed  Clinical Data: No additional findings.  ROS:  All other systems negative, except as noted in the HPI. Review of Systems  Objective: Vital Signs: There were no vitals taken for this visit.  Specialty Comments:  No specialty comments available.  PMFS History: Patient Active Problem List   Diagnosis Date Noted  . Lung nodule 01/08/2021  . COPD (chronic obstructive pulmonary disease) ([place]) 06/26/2020  . 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  Chronic kidney disease, stage 3b ([place]) 08/20/2019  . Acute respiratory failure with hypoxia ([place]) 08/20/2019  . Pneumonia due to COVID-19 virus 08/19/2019  . UTI (urinary tract infection) 12/15/2018  . Chronic diastolic CHF (congestive heart failure) ([place]) 12/15/2018  . HLD (hyperlipidemia) 12/15/2018  . Dehiscence of amputation stump ([place])   . Renal insufficiency 05/06/2018  . Type 2 diabetes mellitus with vascular disease ([place]) 05/06/2018  . Debility 03/31/2018  . Urinary retention with incomplete bladder emptying 03/31/2018  . History of transmetatarsal amputation of left foot ([place])   . 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 ([place])   . Status post transmetatarsal amputation of left foot  ([place])   . Postoperative pain   . Benign prostatic hyperplasia with urinary retention   . New onset atrial fibrillation ([employer])   . Incontinence of feces   . Cerebrovascular accident (CVA) ([place])   . Benign essential HTN   . Diabetes mellitus type 2 in nonobese (HCC)   . Hypokalemia   . Paroxysmal atrial fibrillation (HCC)   . Leukocytosis   . SIRS (systemic inflammatory response syndrome) (HCC)   . Essential hypertension 03/20/2018  . Tachycardia 03/20/2018  . Sepsis ([place]) 03/20/2018  . Diabetes ([place]) 03/20/2018  . Subacute osteomyelitis, left ankle and foot ([place]) 03/20/2018  . A-fib ([place]) 03/20/2018  . Stroke-like episode ([place]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 03/16/2018  . Strain of left tibialis anterior muscle 12/19/2015  . 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 Fracture of radius, distal, left, closed 12/19/2012  . H/O ulcerative colitis 1993   Past Medical History:  Diagnosis Date  . A-fib ([place]) 03/20/2018  . Acute blood loss anemia   . Benign essential HTN   . Benign prostatic hyperplasia with urinary retention   . Cerebrovascular accident (CVA) ([place])   . CKD (chronic kidney disease), stage III ([place])   . Diabetes ([place]) 03/20/2018  . Diabetes mellitus type 2 in nonobese (HCC)   . Essential hypertension 03/20/2018  . Former tobacco use   . H/O ulcerative colitis 1993  . Hypertension   . Hypokalemia   . Leukocytosis   . New onset atrial fibrillation ([place])   . Osteomyelitis ([place])    a. 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  [name] (paroxysmal atrial fibrillation) ([place])    a. dx 03/2018 in setting of stroke, osteomyelitis.  . Sepsis ([place]) 03/20/2018  . Stage 3 chronic kidney disease ([place])    pt/family unaware  . Status post transmetatarsal amputation of left foot ([place])   . Stroke ([place]) 03/2018  . Stroke-like episode ([place]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 03/16/2018  . Tachycardia 03/20/2018  . Wrist fracture    Left    Family History  Problem Relation Age of Onset  . Hypertension Father     Past  Surgical History:  Procedure Laterality Date  . AMPUTATION Left 03/24/2018   Procedure: Transmetatarsal Amputation Left Foot;  Surgeon: [name], MD;  Location: [facility];  Service: Orthopedics;  Laterality: Left;  . CARDIOVERSION N/A 05/05/2018   Procedure: CARDIOVERSION;  Surgeon: [name], MD;  Location: [facility] ENDOSCOPY;  Service: Cardiovascular;  Laterality: N/A;  . COLONOSCOPY  12/2017   benign, remission from ulcerative colitis in 90s  . I & D EXTREMITY Left 03/17/2018   Procedure: IRRIGATION AND DEBRIDEMENT FOOT;  Surgeon: [name], MD;  Location: [facility];  Service: Orthopedics;  Laterality: Left;  . STUMP REVISION Left 05/10/2018   Procedure: REVISION LEFT TRANSMETATARSAL AMPUTATION;  Surgeon: [name], MD;  [facility]  OR;  Service: Orthopedics;  Laterality: Left;   Social History   Occupational History  . Not on file  Tobacco Use  . Smoking status: Former Smoker    Packs/day: 1.00    Years: 20.00    Pack years: 20.00    Types: Cigarettes    Start date: 1963    Quit date: 1985    Years since quitting: 37.3  . Smokeless tobacco: Never Used  Vaping Use  . Vaping Use: Never used  Substance and Sexual Activity  . Alcohol use: Yes    Alcohol/week: 4.0 standard drinks    Types: 4 Cans of beer per week    Comment: social   . Drug use: Never  . Sexual activity: Not on file

## 2021-01-13 NOTE — Telephone Encounter (Signed)
Called and spoke with patient's wife, [name], listed on DPR, regarding Stolto.  She is requiring a written prescription for the stiolto with as many refills as possible (12 preferably).  He states he likes this inhaler and they have found a place in [place] where he can get it for $99 rather than $300-$400 in the [place].  I asked if there is a comparable medication on the preferred list and [name] said they all are the same cost.  Advised that I would send a message to Dr. Lavell Anchors and ask if it is ok to write a script for the [facility] with 12 refills.  I advised we would call her back once we hear back from him.  She verbalized understanding.  Dr. Chase Caller, Please advise if ok to print script for patient for Stiolto with 12 refills?  They want to get it from [place].  Thank you.

## 2021-01-14 MED ORDER — STIOLTO RESPIMAT 2.5-2.5 MCG/ACT IN AERS
2.0000 | INHALATION_SPRAY | Freq: Every day | RESPIRATORY_TRACT | 11 refills | Status: DC
Start: 2021-01-14 — End: 2021-10-30

## 2021-01-14 MED ORDER — STIOLTO RESPIMAT 2.5-2.5 MCG/ACT IN AERS: 2.0000 | INHALATION_SPRAY | Freq: Every day | RESPIRATORY_TRACT | 11 refills | Status: DC

## 2021-01-14 NOTE — Telephone Encounter (Signed)
Kevin [name] wife is returning phone call. Kevin [name] phone number is [phone].

## 2021-01-14 NOTE — Telephone Encounter (Signed)
I could not locate the rx so printed a new rx and given to MR to sign  Spoke with spouse, [name]  She prefers that this be mailed to her  I verified her address  Nothing further needed

## 2021-01-14 NOTE — Telephone Encounter (Signed)
rx signed and mailed

## 2021-01-14 NOTE — Telephone Encounter (Signed)
Yes this is fine.

## 2021-01-21 ENCOUNTER — Ambulatory Visit (HOSPITAL_COMMUNITY)
Admission: RE | Admit: 2021-01-21 | Discharge: 2021-01-21 | Disposition: A | Discharge status: Home / Self Care | Payer: Medicare Other | Source: Ambulatory Visit | Attending: Adult Health | Admitting: Adult Health

## 2021-01-21 ENCOUNTER — Other Ambulatory Visit: Payer: Self-pay

## 2021-01-21 DIAGNOSIS — I2510 Atherosclerotic heart disease of native coronary artery without angina pectoris: Secondary | ICD-10-CM | POA: Diagnosis not present

## 2021-01-21 DIAGNOSIS — R3914 Feeling of incomplete bladder emptying: Secondary | ICD-10-CM | POA: Diagnosis not present

## 2021-01-21 DIAGNOSIS — R911 Solitary pulmonary nodule: Secondary | ICD-10-CM | POA: Diagnosis not present

## 2021-01-21 DIAGNOSIS — I700 Atherosclerosis of aorta: Secondary | ICD-10-CM | POA: Diagnosis not present

## 2021-01-21 DIAGNOSIS — J439 Emphysema, unspecified: Secondary | ICD-10-CM | POA: Diagnosis not present

## 2021-01-21 DIAGNOSIS — R310 Gross hematuria: Secondary | ICD-10-CM | POA: Diagnosis not present

## 2021-01-21 LAB — GLUCOSE, CAPILLARY: Glucose-Capillary: 181 mg/dL — ABNORMAL HIGH (ref 70–99)

## 2021-01-21 MED ORDER — FLUDEOXYGLUCOSE F - 18 (FDG) INJECTION
13.2000 | Freq: Once | INTRAVENOUS | Status: AC | PRN
  Administered 2021-01-21: 12.7 via INTRAVENOUS

## 2021-01-22 ENCOUNTER — Telehealth: Payer: Self-pay | Admitting: Adult Health

## 2021-01-22 NOTE — Telephone Encounter (Signed)
Called and spoke to patient's wife Kevin [name] went over PET scan results. Discussed case with Dr. Chase Caller and Dr. Lamonte Sakai.  Patient will need to be set up with Dr. Lamonte Sakai to discuss lung nodules and next steps such as biopsy  Please call patient and set up on Dr. Lamonte Sakai schedule  Tried to call patient and wife but unable to get

## 2021-01-22 NOTE — Telephone Encounter (Signed)
Called and spoke with patient's wife Kevin [name]. She stated that they had reviewed his PET scan results on MyChart and were confused on the wording used in impressions 1 and 2. I advised her that as soon as TP had a chance to review the results, we would give them a call back. She verbalized understanding.   TP, can you please advise about the PET scan results? Thanks.

## 2021-01-22 NOTE — Telephone Encounter (Signed)
Called and spoke with patient's wife, [name], listed on DPR, scheduled appointment to see Dr. Lamonte Sakai per TP request, patient to see Dr. Lamonte Sakai on 01/29/21 at 11 am, advised to arrive at 10:45 am for check in.  She verbalized understanding.  Nothing further needed.

## 2021-01-29 ENCOUNTER — Other Ambulatory Visit: Payer: Self-pay

## 2021-01-29 ENCOUNTER — Ambulatory Visit (INDEPENDENT_AMBULATORY_CARE_PROVIDER_SITE_OTHER): Payer: Medicare Other | Admitting: Emergency Medicine

## 2021-01-29 ENCOUNTER — Encounter: Payer: Self-pay | Admitting: Emergency Medicine

## 2021-01-29 VITALS — BP 134/72 | HR 69 | Temp 97.5°F | Ht 74.0 in | Wt 256.4 lb

## 2021-01-29 DIAGNOSIS — R911 Solitary pulmonary nodule: Secondary | ICD-10-CM

## 2021-01-29 NOTE — Assessment & Plan Note (Signed)
Reviewed the PET scan, CT scans with him today.  Both the right upper lobe and left upper lobe nodules are adjacent to the pleural surface with mild hypermetabolism.  We discussed the differential diagnosis.  Etiology unclear but concerning that there may be unilateral or bilateral primary lung cancer.  We have agreed to repeat his CT scan of the chest in 3 months, look for interval change, stability.  Depending on the results we will decide whether to pursue navigational bronchoscopy with biopsies.  I did explain to him that the size and location of the nodules increased his risk for pneumothorax.  We reviewed your CT scans and PET scan in office today. We will plan to repeat your CT scan of the chest in 3 months to follow for stability or interval change. Follow Dr. Lamonte Sakai in 3 months after your CT so that we can discuss the results together and decide next steps.

## 2021-01-29 NOTE — Progress Notes (Signed)
Subjective:    Patient ID: Kevin [name], male    DOB: September 21, 1941, 80 y.o.   MRN: [id number]  HPI  80 year old gentleman with history of tobacco use (20 pack years), hypertension, atrial fibrillation, CVA, CKD stage III, diabetes, ulcerative colitis.  He had COVID-19 in 07/2019 and was hospitalized.  Moderate obstruction on pulmonary function testing so he was started on Spiriva which was beneficial.  Part of his work-up included a high-res CT chest that showed emphysema, fibrotic change, in a UIP pattern, at his bases and a 2 cm GG left upper lobe nodule that abutted the left major fissure that was slightly increased in size.  PET scan 01/01/2021 reviewed by me shows right upper lobe 1.5 cm nodule with mild hypermetabolism SUV max 2.9 similar to his prior CT, a posterior left upper lobe 1.6 cm groundglass nodule with SUV 2.9   Review of Systems As per HPI  Past Medical History:  Diagnosis Date  . A-fib ([place]) 03/20/2018  . Acute blood loss anemia   . Benign essential HTN   . Benign prostatic hyperplasia with urinary retention   . Cerebrovascular accident (CVA) ([place])   . CKD (chronic kidney disease), stage III ([place])   . Diabetes ([place]) 03/20/2018  . Diabetes mellitus type 2 in nonobese (HCC)   . Essential hypertension 03/20/2018  . Former tobacco use   . H/O ulcerative colitis 1993  . Hypertension   . Hypokalemia   . Leukocytosis   . New onset atrial fibrillation ([place])   . Osteomyelitis ([place])    a. 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  [name] (paroxysmal atrial fibrillation) ([place])    a. dx 03/2018 in setting of stroke, osteomyelitis.  . Sepsis ([place]) 03/20/2018  . Stage 3 chronic kidney disease ([place])    pt/family unaware  . Status post transmetatarsal amputation of left foot ([place])   . Stroke ([place]) 03/2018  . Stroke-like episode ([place]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 03/16/2018  . Tachycardia 03/20/2018  . Wrist fracture    Left     Family History  Problem Relation Age of Onset  . Hypertension  Father     No hx lung cancer.   Social History   Socioeconomic History  . Marital status: Married    Spouse name: Not on file  . Number of children: Not on file  . Years of education: Not on file  . Highest education level: Bachelor's degree (e.g., BA, AB, BS)  Occupational History  . Not on file  Tobacco Use  . Smoking status: Former Smoker    Packs/day: 1.00    Years: 20.00    Pack years: 20.00    Types: Cigarettes    Start date: 1963    Quit date: 1985    Years since quitting: 37.3  . Smokeless tobacco: Never Used  Vaping Use  . Vaping Use: Never used  Substance and Sexual Activity  . Alcohol use: Yes    Alcohol/week: 4.0 standard drinks    Types: 4 Cans of beer per week    Comment: social   . Drug use: Never  . Sexual activity: Not on file  Other Topics Concern  . Not on file  Social History Narrative   Lives at home with his wife and two cats. Married for almost 45 years   Right handed   Caffeine: 1 cup every morning   Social Determinants of Health   Financial Resource Strain: Not on file  Food Insecurity: Not on file  Transportation Needs: Not  on file  Physical Activity: Not on file  Stress: Not on file  Social Connections: Not on file  Intimate Partner Violence: Not on file      No Known Allergies   Outpatient Medications Prior to Visit  Medication Sig Dispense Refill  . albuterol (VENTOLIN HFA) 108 (90 Base) MCG/ACT inhaler Inhale 2 puffs into the lungs every 6 (six) hours as needed for wheezing or shortness of breath. 24 g 1  . diltiazem (CARDIZEM CD) 120 MG 24 hr capsule TAKE 1 CAPSULE DAILY (DECREASE IN DOSE) 90 capsule 1  . ELIQUIS 5 MG TABS tablet TAKE 1 TABLET TWICE A DAY 180 tablet 2  . FLUoxetine (PROZAC) 20 MG capsule Take 20 mg by mouth daily.    . furosemide (LASIX) 20 MG tablet TAKE 1 TABLET DAILY. YOU MAY TAKE AN EXTRA 20 MG TABLET FOR INCREASED SWELLING OR WEIGHT GAIN 135 tablet 2  . insulin glargine (LANTUS) 100 UNIT/ML injection  Inject 38 Units into the skin 2 (two) times daily.    . mesalamine (LIALDA) 1.2 g EC tablet Take 4.8 g by mouth daily.    . metoprolol succinate (TOPROL-XL) 50 MG 24 hr tablet Take 1 tablet (50 mg total) by mouth daily. Take with or immediately following a meal. 90 tablet 3  . Multiple Vitamin (MULTIVITAMIN WITH MINERALS) TABS tablet Take 1 tablet by mouth daily.    . rosuvastatin (CRESTOR) 10 MG tablet Take 5 mg by mouth daily.    . tamsulosin (FLOMAX) 0.4 MG CAPS capsule Take 1 capsule (0.4 mg total) by mouth 2 times daily at 12 noon and 4 pm. 60 capsule 0  . Tiotropium Bromide-Olodaterol (STIOLTO RESPIMAT) 2.5-2.5 MCG/ACT AERS Inhale 2 puffs into the lungs daily. 4 g 11  . trimethoprim (TRIMPEX) 100 MG tablet Take 100 mg by mouth at bedtime.   11   No facility-administered medications prior to visit.         Objective:   Physical Exam  Today's Vitals   01/29/21 1101  BP: 134/72  Pulse: 69  Temp: (!) 97.5 F (36.4 C)  TempSrc: Temporal  SpO2: 93%  Weight: 256 lb 6.4 oz (116.3 kg)  Height: 6' 2"  (1.88 m)   Body mass index is 32.92 kg/m.;sm  Gen: Pleasant, well-nourished, in no distress,  normal affect  ENT: No lesions,  mouth clear,  oropharynx clear, no postnasal drip  Neck: No JVD, no stridor  Lungs: No use of accessory muscles, no crackles or wheezing on normal respiration, no wheeze on forced expiration  Cardiovascular: RRR, heart sounds normal, no murmur or gallops, no peripheral edema  Musculoskeletal: No deformities, no cyanosis or clubbing  Neuro: alert, awake, non focal  Skin: Warm, no lesions or rash    Assessment & Plan:  Pulmonary nodules/lesions, multiple Reviewed the PET scan, CT scans with him today.  Both the right upper lobe and left upper lobe nodules are adjacent to the pleural surface with mild hypermetabolism.  We discussed the differential diagnosis.  Etiology unclear but concerning that there may be unilateral or bilateral primary lung cancer.   We have agreed to repeat his CT scan of the chest in 3 months, look for interval change, stability.  Depending on the results we will decide whether to pursue navigational bronchoscopy with biopsies.  I did explain to him that the size and location of the nodules increased his risk for pneumothorax.  We reviewed your CT scans and PET scan in office today. We will plan to  repeat your CT scan of the chest in 3 months to follow for stability or interval change. Follow Dr. Lamonte Sakai in 3 months after your CT so that we can discuss the results together and decide next steps.   Baltazar Apo, MD, PhD 01/29/2021, 5:37 PM [facility] [phone] or if no answer before 7:00PM call [phone] For any issues after 7:00PM please call eLink [phone]

## 2021-01-29 NOTE — Patient Instructions (Signed)
We reviewed your CT scans and PET scan in office today. We will plan to repeat your CT scan of the chest in 3 months to follow for stability or interval change. Follow Dr. Lamonte Sakai in 3 months after your CT so that we can discuss the results together and decide next steps.

## 2021-02-05 ENCOUNTER — Other Ambulatory Visit: Payer: Self-pay | Admitting: Internal Medicine

## 2021-02-05 DIAGNOSIS — J439 Emphysema, unspecified: Secondary | ICD-10-CM

## 2021-02-05 DIAGNOSIS — Z23 Encounter for immunization: Secondary | ICD-10-CM | POA: Diagnosis not present

## 2021-02-10 ENCOUNTER — Ambulatory Visit: Payer: Medicare Other | Admitting: Internal Medicine

## 2021-02-18 ENCOUNTER — Ambulatory Visit: Payer: Medicare Other | Admitting: Internal Medicine

## 2021-02-23 ENCOUNTER — Other Ambulatory Visit: Payer: Self-pay | Admitting: Cardiovascular Disease

## 2021-03-04 DIAGNOSIS — F3341 Major depressive disorder, recurrent, in partial remission: Secondary | ICD-10-CM | POA: Diagnosis not present

## 2021-03-04 DIAGNOSIS — D692 Other nonthrombocytopenic purpura: Secondary | ICD-10-CM | POA: Diagnosis not present

## 2021-03-04 DIAGNOSIS — D6869 Other thrombophilia: Secondary | ICD-10-CM | POA: Diagnosis not present

## 2021-03-04 DIAGNOSIS — Z86010 Personal history of colonic polyps: Secondary | ICD-10-CM | POA: Diagnosis not present

## 2021-03-04 DIAGNOSIS — I4891 Unspecified atrial fibrillation: Secondary | ICD-10-CM | POA: Diagnosis not present

## 2021-03-04 DIAGNOSIS — I700 Atherosclerosis of aorta: Secondary | ICD-10-CM | POA: Diagnosis not present

## 2021-03-04 DIAGNOSIS — J449 Chronic obstructive pulmonary disease, unspecified: Secondary | ICD-10-CM | POA: Diagnosis not present

## 2021-03-04 DIAGNOSIS — E1169 Type 2 diabetes mellitus with other specified complication: Secondary | ICD-10-CM | POA: Diagnosis not present

## 2021-03-04 DIAGNOSIS — Z794 Long term (current) use of insulin: Secondary | ICD-10-CM | POA: Diagnosis not present

## 2021-03-04 DIAGNOSIS — I10 Essential (primary) hypertension: Secondary | ICD-10-CM | POA: Diagnosis not present

## 2021-03-04 DIAGNOSIS — E7800 Pure hypercholesterolemia, unspecified: Secondary | ICD-10-CM | POA: Diagnosis not present

## 2021-03-09 DIAGNOSIS — H2513 Age-related nuclear cataract, bilateral: Secondary | ICD-10-CM | POA: Diagnosis not present

## 2021-03-09 DIAGNOSIS — H25013 Cortical age-related cataract, bilateral: Secondary | ICD-10-CM | POA: Diagnosis not present

## 2021-03-09 DIAGNOSIS — H18413 Arcus senilis, bilateral: Secondary | ICD-10-CM | POA: Diagnosis not present

## 2021-03-09 DIAGNOSIS — H25043 Posterior subcapsular polar age-related cataract, bilateral: Secondary | ICD-10-CM | POA: Diagnosis not present

## 2021-03-10 ENCOUNTER — Ambulatory Visit (INDEPENDENT_AMBULATORY_CARE_PROVIDER_SITE_OTHER): Payer: Medicare Other | Admitting: Orthopedic Surgery

## 2021-03-10 ENCOUNTER — Other Ambulatory Visit: Payer: Self-pay

## 2021-03-10 DIAGNOSIS — Z89432 Acquired absence of left foot: Secondary | ICD-10-CM

## 2021-03-10 DIAGNOSIS — M79672 Pain in left foot: Secondary | ICD-10-CM | POA: Diagnosis not present

## 2021-03-20 ENCOUNTER — Encounter: Payer: Self-pay | Admitting: Orthopedic Surgery

## 2021-03-20 NOTE — Progress Notes (Signed)
Office Visit Note   Patient: Kevin [name]           Date of Birth: Nov 14, 1940           MRN: [id number] Visit Date: 03/10/2021              Requested by: [name], MD [address] [place],  [place] [address] PCP: [name], MD  Chief Complaint  Patient presents with   Left Foot - Follow-up    HPI: Patient is a 80 year old gentleman who presents in follow-up for left transmetatarsal amputation states he is doing well and he is healing slowly secondary to his activities.  Assessment & Plan: Visit Diagnoses:  1. History of transmetatarsal amputation of left foot ([place])     Plan: Ulcer debrided recommend the patient proceed with getting his orthotics extra-depth shoes and spacer.  Follow-Up Instructions: Return in about 4 weeks (around 04/07/2021).   Ortho Exam  Patient is alert, oriented, no adenopathy, well-dressed, normal affect, normal respiratory effort. Examination patient has dorsiflexion to neutral he will work on Achilles stretching he has an ulcer that is 10 mm in diameter after informed consent a 10 blade knife was used to debride the skin and soft tissue back to healthy viable granulation tissue the ulcer was 20 mm in diameter 1 mm deep after debridement.  Imaging: No results found. No images are attached to the encounter.  Labs: Lab Results  Component Value Date   HGBA1C 8.7 (H) 08/20/2019   HGBA1C 10.6 (H) 12/17/2018   HGBA1C 7.6 (H) 03/17/2018   CRP 0.8 08/24/2019   CRP <0.8 08/23/2019   CRP 2.3 (H) 08/22/2019   REPTSTATUS 08/23/2019 FINAL 08/20/2019   GRAMSTAIN  03/17/2018    MODERATE WBC PRESENT, PREDOMINANTLY PMN MODERATE GRAM POSITIVE COCCI FEW GRAM NEGATIVE RODS    CULT >=100,000 COLONIES/mL ENTEROCOCCUS FAECALIS (A) 08/20/2019   LABORGA ENTEROCOCCUS FAECALIS (A) 08/20/2019     Lab Results  Component Value Date   ALBUMIN 3.3 (L) 08/24/2019   ALBUMIN 3.2 (L) 08/23/2019   ALBUMIN 3.4 (L) 08/22/2019    Lab  Results  Component Value Date   MG 2.1 08/24/2019   MG 2.1 08/23/2019   MG 2.1 08/22/2019   No results found for: VD25OH  No results found for: PREALBUMIN CBC EXTENDED Latest Ref Rng & Units 08/24/2019 08/23/2019 08/22/2019  WBC 4.0 - 10.5 K/uL 11.9(H) 10.7(H) 10.2  RBC 4.22 - 5.81 MIL/uL 4.50 4.82 4.73  HGB 13.0 - 17.0 g/dL 13.6 14.5 14.4  HCT 39.0 - 52.0 % 40.2 43.5 42.8  PLT 150 - 400 K/uL 275 235 241  NEUTROABS 1.7 - 7.7 K/uL 8.7(H) 8.5(H) 7.3  LYMPHSABS 0.7 - 4.0 K/uL 2.0 1.5 1.8     There is no height or weight on file to calculate BMI.  Orders:  No orders of the defined types were placed in this encounter.  No orders of the defined types were placed in this encounter.    Procedures: No procedures performed  Clinical Data: No additional findings.  ROS:  All other systems negative, except as noted in the HPI. Review of Systems  Objective: Vital Signs: There were no vitals taken for this visit.  Specialty Comments:  No specialty comments available.  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   Chronic kidney disease, stage  3b ([place]) 08/20/2019   Acute respiratory failure with hypoxia ([place]) 08/20/2019   Pneumonia due to COVID-19 virus 08/19/2019   UTI (urinary tract infection) 12/15/2018   Chronic diastolic CHF (congestive heart failure) ([place]) 12/15/2018   HLD (hyperlipidemia) 12/15/2018   Dehiscence of amputation stump ([place])    Renal insufficiency 05/06/2018   Type 2 diabetes mellitus with vascular disease ([place]) 05/06/2018   Debility 03/31/2018   Urinary retention with incomplete bladder emptying 03/31/2018   History of transmetatarsal amputation of left foot (HCC)    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 (HCC)    Status post transmetatarsal  amputation of left foot (HCC)    Postoperative pain    Benign prostatic hyperplasia with urinary retention    New onset atrial fibrillation (HCC)    Incontinence of feces    Cerebrovascular accident (CVA) ([place])    Benign essential HTN    Diabetes mellitus type 2 in nonobese ([place])    Hypokalemia    Paroxysmal atrial fibrillation (HCC)    Leukocytosis    SIRS (systemic inflammatory response syndrome) ([place])    Essential hypertension 03/20/2018   Tachycardia 03/20/2018   Sepsis ([place]) 03/20/2018   Diabetes ([place]) 03/20/2018   Subacute osteomyelitis, left ankle and foot ([place]) 03/20/2018   A-fib ([place]) 03/20/2018   Stroke-like episode ([place]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 03/16/2018   Strain of left tibialis anterior muscle 12/19/2015   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   Fracture of radius, distal, left, closed 12/19/2012   H/O ulcerative colitis 1993   Past Medical History:  Diagnosis Date   A-fib ([place]) 03/20/2018   Acute blood loss anemia    Benign essential HTN    Benign prostatic hyperplasia with urinary retention    Cerebrovascular accident (CVA) ([place])    CKD (chronic kidney disease), stage III ([place])    Diabetes ([place]) 03/20/2018   Diabetes mellitus type 2 in nonobese [facility])    Essential hypertension 03/20/2018   Former tobacco use    H/O ulcerative colitis 1993   Hypertension    Hypokalemia    Leukocytosis    New onset atrial fibrillation ([place])    Osteomyelitis ([place])    a. 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   PAF (paroxysmal atrial fibrillation) ([place])    a. dx 03/2018 in setting of stroke, osteomyelitis.   Sepsis ([place]) 03/20/2018   Stage 3 chronic kidney disease ([place])    pt/family unaware   Status post transmetatarsal amputation of left foot ([place])    Stroke ([place]) 03/2018   Stroke-like episode ([place]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 03/16/2018   Tachycardia 03/20/2018   Wrist fracture    Left    Family History  Problem Relation Age of Onset   Hypertension Father     Past Surgical History:   Procedure Laterality Date   AMPUTATION Left 03/24/2018   Procedure: Transmetatarsal Amputation Left Foot;  Surgeon: [name], MD;  Location: [facility];  Service: Orthopedics;  Laterality: Left;   CARDIOVERSION N/A 05/05/2018   Procedure: CARDIOVERSION;  Surgeon: [name], MD;  Location: [facility] ENDOSCOPY;  Service: Cardiovascular;  Laterality: N/A;   COLONOSCOPY  12/2017   benign, remission from ulcerative colitis in 90s   I & D EXTREMITY Left 03/17/2018   Procedure: [place];  Surgeon: [name], MD;  Location: [facility];  Service: Orthopedics;  Laterality: Left;   STUMP REVISION Left 05/10/2018   Procedure: REVISION LEFT TRANSMETATARSAL AMPUTATION;  Surgeon: [name], MD;  Location: [facility];  Service: Orthopedics;  Laterality: Left;   Social History   Occupational History   Not on file  Tobacco Use   Smoking status: Former    Packs/day: 1.00    Years: 20.00    Pack years: 20.00    Types: Cigarettes    Start date: 1963    Quit date: 1985    Years since quitting: 37.5   Smokeless tobacco: Never  Vaping Use   Vaping Use: Never used  Substance and Sexual Activity   Alcohol use: Yes    Alcohol/week: 4.0 standard drinks    Types: 4 Cans of beer per week    Comment: social    Drug use: Never   Sexual activity: Not on file

## 2021-03-24 DIAGNOSIS — H25012 Cortical age-related cataract, left eye: Secondary | ICD-10-CM | POA: Diagnosis not present

## 2021-03-24 DIAGNOSIS — H2511 Age-related nuclear cataract, right eye: Secondary | ICD-10-CM | POA: Diagnosis not present

## 2021-03-24 DIAGNOSIS — H2512 Age-related nuclear cataract, left eye: Secondary | ICD-10-CM | POA: Diagnosis not present

## 2021-03-24 DIAGNOSIS — H25042 Posterior subcapsular polar age-related cataract, left eye: Secondary | ICD-10-CM | POA: Diagnosis not present

## 2021-03-31 ENCOUNTER — Telehealth: Payer: Self-pay | Admitting: Orthopedic Surgery

## 2021-03-31 DIAGNOSIS — H25812 Combined forms of age-related cataract, left eye: Secondary | ICD-10-CM | POA: Diagnosis not present

## 2021-03-31 DIAGNOSIS — H2512 Age-related nuclear cataract, left eye: Secondary | ICD-10-CM | POA: Diagnosis not present

## 2021-03-31 NOTE — Telephone Encounter (Signed)
03/10/21 ov note faxed to [facility] [phone]

## 2021-04-07 ENCOUNTER — Telehealth: Payer: Self-pay | Admitting: Physician Assistant

## 2021-04-07 DIAGNOSIS — R319 Hematuria, unspecified: Secondary | ICD-10-CM

## 2021-04-07 NOTE — Telephone Encounter (Signed)
Pt c/o medication issue:  1. Name of Medication: ELIQUIS 5 MG TABS tablet  2. How are you currently taking this medication (dosage and times per day)? 1 tablet twice a day  3. Are you having a reaction (difficulty breathing--STAT)? no  4. What is your medication issue? Patient's wife states the patient is having gross hematoma in his urine. She states it is not the first time this has happened and it does not seem to be a bladder or urology issue.

## 2021-04-07 NOTE — Telephone Encounter (Signed)
Pt's wife aware to have pt hold Eliquis for couple of days and will let pt know to come in this week for CBC and also make appt with urologists Per wife pt has appt in August with urologists Will try and get appt moved up ./cy

## 2021-04-07 NOTE — Telephone Encounter (Signed)
Spoke with pt's wife and pt was in background Pt has noted gross amount of bleeding when urinating for 4 days Per wife pt had this happen in 2021 and Eliquis was stopped for few days and blood cleared up and pt restarted Eliquis Pt had previous testing on bladder back in 2021 and all tests came back fine  other than enlarged prostrate and Eliquis was thought to be the culprit  Will forward to Ermalinda Barrios PA for review and recommendations ./cy

## 2021-04-09 ENCOUNTER — Other Ambulatory Visit: Payer: Self-pay

## 2021-04-09 ENCOUNTER — Other Ambulatory Visit: Payer: Medicare Other

## 2021-04-09 DIAGNOSIS — R319 Hematuria, unspecified: Secondary | ICD-10-CM

## 2021-04-09 LAB — CBC
Hematocrit: 43.7 % (ref 37.5–51.0)
Hemoglobin: 14.6 g/dL (ref 13.0–17.7)
MCH: 30.8 pg (ref 26.6–33.0)
MCHC: 33.4 g/dL (ref 31.5–35.7)
MCV: 92 fL (ref 79–97)
Platelets: 261 10*3/uL (ref 150–450)
RBC: 4.74 x10E6/uL (ref 4.14–5.80)
RDW: 13.2 % (ref 11.6–15.4)
WBC: 10.3 10*3/uL (ref 3.4–10.8)

## 2021-04-13 ENCOUNTER — Ambulatory Visit (INDEPENDENT_AMBULATORY_CARE_PROVIDER_SITE_OTHER): Payer: Medicare Other | Admitting: Physician Assistant

## 2021-04-13 ENCOUNTER — Telehealth: Payer: Self-pay | Admitting: Physician Assistant

## 2021-04-13 DIAGNOSIS — Z89432 Acquired absence of left foot: Secondary | ICD-10-CM

## 2021-04-13 NOTE — Telephone Encounter (Signed)
LM for pt to call back.

## 2021-04-13 NOTE — Telephone Encounter (Signed)
Wife of the patient was returning a call to April.    Pt c/o medication issue:  1. Name of Medication: ELIQUIS 5 MG TABS tablet  2. How are you currently taking this medication (dosage and times per day)? As directed   3. Are you having a reaction (difficulty breathing--STAT)? Blood in urine   4. What is your medication issue? Wife of patient wanted to know if there is an alternative medication that the patient can take. Patient went to his Urologist and was advised to hold Eliquis and the symptoms have stopped in the past but they have come back again   Wife asks that the office speak to her, because she coordinates everyting for her Husband

## 2021-04-13 NOTE — Progress Notes (Signed)
Office Visit Note   Patient: Kevin [name]           Date of Birth: May 10, 1941           MRN: [id number] Visit Date: 04/13/2021              Requested by: [name], MD [address] [place],  [place] [address] PCP: [name], MD  Chief Complaint  Patient presents with   Left Foot - Pain      HPI: Patient is a 80 year old gentleman who follows up for his transmetatarsal amputation.  He has obtained his filler just recently.  He comes in today for periodic trimming of a plantar ulcer.  His wife has been caring for this daily at home  Assessment & Plan: Visit Diagnoses: No diagnosis found.  Plan: Follow-up in 1 month  Follow-Up Instructions: No follow-ups on file.   Ortho Exam  Patient is alert, oriented, no adenopathy, well-dressed, normal affect, normal respiratory effort. Examination well-healed surgical incision.  Ulcer beneath the metatarsal has a good healthy base does not tunnel or probe deeply.  The surrounding callus was debrided to a softer surface.  No ascending cellulitis or signs of infection  Imaging: No results found. No images are attached to the encounter.  Labs: Lab Results  Component Value Date   HGBA1C 8.7 (H) 08/20/2019   HGBA1C 10.6 (H) 12/17/2018   HGBA1C 7.6 (H) 03/17/2018   CRP 0.8 08/24/2019   CRP <0.8 08/23/2019   CRP 2.3 (H) 08/22/2019   REPTSTATUS 08/23/2019 FINAL 08/20/2019   GRAMSTAIN  03/17/2018    MODERATE WBC PRESENT, PREDOMINANTLY PMN MODERATE GRAM POSITIVE COCCI FEW GRAM NEGATIVE RODS    CULT >=100,000 COLONIES/mL ENTEROCOCCUS FAECALIS (A) 08/20/2019   LABORGA ENTEROCOCCUS FAECALIS (A) 08/20/2019     Lab Results  Component Value Date   ALBUMIN 3.3 (L) 08/24/2019   ALBUMIN 3.2 (L) 08/23/2019   ALBUMIN 3.4 (L) 08/22/2019    Lab Results  Component Value Date   MG 2.1 08/24/2019   MG 2.1 08/23/2019   MG 2.1 08/22/2019   No results found for: VD25OH  No results found for:  PREALBUMIN CBC EXTENDED Latest Ref Rng & Units 04/09/2021 08/24/2019 08/23/2019  WBC 3.4 - 10.8 x10E3/uL 10.3 11.9(H) 10.7(H)  RBC 4.14 - 5.80 x10E6/uL 4.74 4.50 4.82  HGB 13.0 - 17.7 g/dL 14.6 13.6 14.5  HCT 37.5 - 51.0 % 43.7 40.2 43.5  PLT 150 - 450 x10E3/uL 261 275 235  NEUTROABS 1.7 - 7.7 K/uL - 8.7(H) 8.5(H)  LYMPHSABS 0.7 - 4.0 K/uL - 2.0 1.5     There is no height or weight on file to calculate BMI.  Orders:  No orders of the defined types were placed in this encounter.  No orders of the defined types were placed in this encounter.    Procedures: No procedures performed  Clinical Data: No additional findings.  ROS:  All other systems negative, except as noted in the HPI. Review of Systems  Objective: Vital Signs: There were no vitals taken for this visit.  Specialty Comments:  No specialty comments available.  PMFS History: Patient Active Problem List   Diagnosis Date Noted   Pulmonary nodules/lesions, multiple 01/08/2021   COPD (chronic obstructive pulmonary disease) ([place]) 06/26/2020   Postinflammatory pulmonary fibrosis ([place] ) 06/26/2020   Chronic kidney disease, stage 3b ([place]) 08/20/2019   Acute respiratory failure with hypoxia ([place]) 08/20/2019   Pneumonia due to COVID-19 virus 08/19/2019   UTI (  urinary tract infection) 12/15/2018   Chronic diastolic CHF (congestive heart failure) ([place]) 12/15/2018   HLD (hyperlipidemia) 12/15/2018   Dehiscence of amputation stump ([place])    Renal insufficiency 05/06/2018   Type 2 diabetes mellitus with vascular disease ([place]) 05/06/2018   Debility 03/31/2018   Urinary retention with incomplete bladder emptying 03/31/2018   History of transmetatarsal amputation of left foot (HCC)    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 (HCC)    Status post transmetatarsal amputation of left foot (HCC)    Postoperative pain    Benign prostatic hyperplasia with urinary retention    New onset atrial fibrillation (HCC)     Incontinence of feces    Cerebrovascular accident (CVA) ([place])    Benign essential HTN    Diabetes mellitus type 2 in nonobese ([place])    Hypokalemia    Paroxysmal atrial fibrillation (HCC)    Leukocytosis    SIRS (systemic inflammatory response syndrome) ([place])    Essential hypertension 03/20/2018   Tachycardia 03/20/2018   Sepsis ([place]) 03/20/2018   Diabetes ([place]) 03/20/2018   Subacute osteomyelitis, left ankle and foot ([place]) 03/20/2018   A-fib ([place]) 03/20/2018   Stroke-like episode ([place]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 03/16/2018   Strain of left tibialis anterior muscle 12/19/2015   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   Fracture of radius, distal, left, closed 12/19/2012   H/O ulcerative colitis 1993   Past Medical History:  Diagnosis Date   A-fib ([place]) 03/20/2018   Acute blood loss anemia    Benign essential HTN    Benign prostatic hyperplasia with urinary retention    Cerebrovascular accident (CVA) ([place])    CKD (chronic kidney disease), stage III ([name])    Diabetes ([place]) 03/20/2018   Diabetes mellitus type 2 in nonobese [facility])    Essential hypertension 03/20/2018   Former tobacco use    H/O ulcerative colitis 1993   Hypertension    Hypokalemia    Leukocytosis    New onset atrial fibrillation ([place])    Osteomyelitis ([place])    a. 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   PAF (paroxysmal atrial fibrillation) ([place])    a. dx 03/2018 in setting of stroke, osteomyelitis.   Sepsis ([place]) 03/20/2018   Stage 3 chronic kidney disease ([place])    pt/family unaware   Status post transmetatarsal amputation of left foot ([place])    Stroke ([place]) 03/2018   Stroke-like episode ([place]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 03/16/2018   Tachycardia 03/20/2018   Wrist fracture    Left    Family History  Problem Relation Age of Onset   Hypertension Father     Past Surgical History:  Procedure Laterality Date   AMPUTATION Left 03/24/2018   Procedure: Transmetatarsal Amputation Left Foot;  Surgeon: [name], MD;  Location: [facility];   Service: Orthopedics;  Laterality: Left;   CARDIOVERSION N/A 05/05/2018   Procedure: CARDIOVERSION;  Surgeon: [name], MD;  Location: [facility] ENDOSCOPY;  Service: Cardiovascular;  Laterality: N/A;   COLONOSCOPY  12/2017   benign, remission from ulcerative colitis in 90s   I & D EXTREMITY Left 03/17/2018   Procedure: [place];  Surgeon: [name], MD;  Location: [facility];  Service: Orthopedics;  Laterality: Left;   STUMP REVISION Left 05/10/2018   Procedure: REVISION LEFT TRANSMETATARSAL AMPUTATION;  Surgeon: [name], MD;  Location: [facility];  Service: Orthopedics;  Laterality: Left;   Social History   Occupational History   Not on file  Tobacco Use   Smoking status:  Former    Packs/day: 1.00    Years: 20.00    Pack years: 20.00    Types: Cigarettes    Start date: 1963    Quit date: 1985    Years since quitting: 37.5   Smokeless tobacco: Never  Vaping Use   Vaping Use: Never used  Substance and Sexual Activity   Alcohol use: Yes    Alcohol/week: 4.0 standard drinks    Types: 4 Cans of beer per week    Comment: social    Drug use: Never   Sexual activity: Not on file

## 2021-04-13 NOTE — Telephone Encounter (Signed)
I spoke with Pt's Wife. She stated that the Urologist advised him to hold the Eliquis for 2 days to see if the bleeding stopped and it did. He started it back after the 2 days and within 2 days he was bleeding again so he has stopped it completely.  Msg fwd to Ermalinda Barrios, PA-C for recommendation.

## 2021-04-13 NOTE — Telephone Encounter (Signed)
New Message:     Patient's wife said she would like for Ermalinda Barrios or her nurse to call her today please. She said she need to talk directly to one of them. She said she can explain the situation better to one of them. She said it is concerning his Eliquis. No other details were given.

## 2021-04-15 ENCOUNTER — Other Ambulatory Visit: Payer: Self-pay

## 2021-04-15 MED ORDER — RIVAROXABAN 20 MG PO TABS: 20.0000 mg | ORAL_TABLET | Freq: Every day | ORAL | 3 refills | Status: DC

## 2021-04-15 MED ORDER — RIVAROXABAN 20 MG PO TABS: 20.0000 mg | ORAL_TABLET | Freq: Every day | ORAL | 1 refills | Status: DC

## 2021-04-15 NOTE — Telephone Encounter (Signed)
Would reach out to pt in a few weeks to follow up with tolerability - multiple phase 4 studies have shown highest risk of bleeding with Xarelto compared to any other anticoagulant.

## 2021-04-15 NOTE — Telephone Encounter (Signed)
After speaking with Ermalinda Barrios, PA-C and the pt's Wife. She has decided to switch pt to Xarelto 72m daily. We will give 2 weeks samples just to make sure the pt can tolerate this. I am sending in an Rx so if it needs a PA we can go ahead and obtain that.  Pt Wife agreed to come pick up the samples this afternoon.

## 2021-04-15 NOTE — Telephone Encounter (Signed)
Patient with A Fib and history of stroke.  Needs to be on anticoagulation.  He is very high risk for another stroke while off anticoagulation.  His options include Eliquis, Xarelto, Savaysa, or warfarin.  Eliquis has the lowest bleeding risk amongst those. Needs to restart as soon as urologist deems it acceptable.

## 2021-04-16 NOTE — Telephone Encounter (Signed)
Received fax from [facility] that Xarelto is not covered on his insurance plan. We don't have his Part D card scanned into Epic so I can't see which formulary alternatives are covered. He had been taking Eliquis so that's covered, Pradaxa may be - easiest way to tell would be to send rx to pharmacy and see if they can process it if he wanted to try that instead.

## 2021-04-16 NOTE — Telephone Encounter (Signed)
April - I'm covering Kevin [name]'s inbox over the next week.  Pls let me know what the patient would like to do re: potentially switching over to pradaxa vs paying more for xarelto (not covered) vs resuming eliquis.

## 2021-04-17 DIAGNOSIS — R310 Gross hematuria: Secondary | ICD-10-CM | POA: Diagnosis not present

## 2021-04-17 DIAGNOSIS — R350 Frequency of micturition: Secondary | ICD-10-CM | POA: Diagnosis not present

## 2021-04-22 ENCOUNTER — Ambulatory Visit
Admission: RE | Admit: 2021-04-22 | Discharge: 2021-04-22 | Disposition: A | Discharge status: Home / Self Care | Payer: Medicare Other | Source: Ambulatory Visit | Attending: Emergency Medicine | Admitting: Emergency Medicine

## 2021-04-22 ENCOUNTER — Other Ambulatory Visit: Payer: Self-pay

## 2021-04-22 DIAGNOSIS — R911 Solitary pulmonary nodule: Secondary | ICD-10-CM

## 2021-04-22 DIAGNOSIS — R918 Other nonspecific abnormal finding of lung field: Secondary | ICD-10-CM | POA: Diagnosis not present

## 2021-04-22 DIAGNOSIS — J439 Emphysema, unspecified: Secondary | ICD-10-CM | POA: Diagnosis not present

## 2021-04-22 DIAGNOSIS — I700 Atherosclerosis of aorta: Secondary | ICD-10-CM | POA: Diagnosis not present

## 2021-04-30 DIAGNOSIS — N130 Hydronephrosis with ureteropelvic junction obstruction: Secondary | ICD-10-CM | POA: Diagnosis not present

## 2021-04-30 DIAGNOSIS — N401 Enlarged prostate with lower urinary tract symptoms: Secondary | ICD-10-CM | POA: Diagnosis not present

## 2021-04-30 DIAGNOSIS — R3914 Feeling of incomplete bladder emptying: Secondary | ICD-10-CM | POA: Diagnosis not present

## 2021-04-30 DIAGNOSIS — N3020 Other chronic cystitis without hematuria: Secondary | ICD-10-CM | POA: Diagnosis not present

## 2021-04-30 DIAGNOSIS — R310 Gross hematuria: Secondary | ICD-10-CM | POA: Diagnosis not present

## 2021-04-30 NOTE — Telephone Encounter (Signed)
I called pt to follow up on him taking the Xarelto. He stated that his Urologist (Dr Gloriann Loan @ [facility]) has told him to hold off on taking it because they found some spots on his Bladder and he needs to have a biopsy done.   His Wife also stated that after taking it for 2 days he had blood in his urine (same thing that happened with Eliquis) so he stopped taking it. She said after they take care of the biopsy if he has to take one they are willing to try Eliquis again because it's on his formulary and more cost efficient.   Dr Gloriann Loan told the pt today that he is going to reach out to Dr [name] to discuss this.

## 2021-05-01 ENCOUNTER — Other Ambulatory Visit: Payer: Self-pay

## 2021-05-01 MED ORDER — METOPROLOL SUCCINATE ER 50 MG PO TB24: 50.0000 mg | ORAL_TABLET | Freq: Every day | ORAL | 0 refills | Status: DC

## 2021-05-04 ENCOUNTER — Telehealth: Payer: Self-pay | Admitting: Cardiovascular Disease

## 2021-05-04 NOTE — Telephone Encounter (Signed)
   [facility] Pre-operative Risk Assessment    Request for surgical clearance:  What type of surgery is being performed? Bladder biopsy and TURP   When is this surgery scheduled? 05/27/2021   What type of clearance is required (medical clearance vs. Pharmacy clearance to hold med vs. Both)? both  Are there any medications that need to be held prior to surgery and how long?rivaroxaban (XARELTO) 20 MG TABS tablet 3 days prior to   Practice name and name of physician performing surgery? Dr Gloriann Loan   What is your office phone number [phone]    7.   What is your office fax number [phone]  8.   Anesthesia type (None, local, MAC, general) ? general   Milbert Coulter 05/04/2021, 3:30 PM  _________________________________________________________________   (provider comments below)

## 2021-05-05 NOTE — Telephone Encounter (Signed)
   Name: Kevin [name]  DOB: 01-02-1941  MRN: [id number]  Primary Cardiologist: Lauree Chandler, MD  Chart reviewed as part of pre-operative protocol coverage. Because of [name]'s past medical history and time since last visit, he will require a follow-up visit in order to better assess preoperative cardiovascular risk.  Pre-op covering staff: - Please schedule appointment and call patient to inform them. If patient already had an upcoming appointment within acceptable timeframe, please add "pre-op clearance" to the appointment notes so provider is aware. - Please contact requesting surgeon's office via preferred method (i.e, phone, fax) to inform them of need for appointment prior to surgery.  If applicable, this message will also be routed to pharmacy pool and/or primary cardiologist for input on holding anticoagulant/antiplatelet agent as requested below so that this information is available to the clearing provider at time of patient's appointment.   [place], [place]  05/05/2021, 7:23 PM

## 2021-05-05 NOTE — Telephone Encounter (Signed)
Patient with diagnosis of afib on Xarelto for anticoagulation.    Procedure:  Bladder biopsy and TURP  Date of procedure: 05/27/21  CHA2DS2-VASc Score = 7  This indicates a 11.2% annual risk of stroke. The patient's score is based upon: CHF History: Yes HTN History: Yes Diabetes History: Yes Stroke History: Yes Vascular Disease History: No Age Score: 2 Gender Score: 0     CrCl 52 ml/min Platelet count 261  It appears that patient may be off of anticoagulation currently. Dr. [name] and Ignacia Bayley both advised patient to hold Xarelto as recommended by urologist.

## 2021-05-06 ENCOUNTER — Other Ambulatory Visit: Payer: Self-pay

## 2021-05-06 MED ORDER — METOPROLOL SUCCINATE ER 50 MG PO TB24: 50.0000 mg | ORAL_TABLET | Freq: Every day | ORAL | 0 refills | Status: DC

## 2021-05-06 NOTE — Telephone Encounter (Signed)
Patient scheduled for pre-op clearance appointment on 05/12/21 at 10:45 AM with Cecilie Kicks, NP. Will route to requesting surgeons office via the epic fax function to make them aware.

## 2021-05-06 NOTE — Telephone Encounter (Signed)
Left a message for patient to call the office and schedule an appointment in order to be cleared for upcoming procedure that is scheduled on 05/27/21.

## 2021-05-08 ENCOUNTER — Other Ambulatory Visit: Payer: Self-pay | Admitting: *Deleted

## 2021-05-08 MED ORDER — APIXABAN 5 MG PO TABS: 5.0000 mg | ORAL_TABLET | Freq: Two times a day (BID) | ORAL | 1 refills | Status: DC

## 2021-05-08 NOTE — Telephone Encounter (Signed)
Pt has re started Eliquis, I added it back to his medication list. Will route to APP and PharmD to determine if he will need sooner monitoring since he has had hematuria on both.

## 2021-05-11 NOTE — Progress Notes (Signed)
Cardiology Office Note   Date:  05/12/2021   ID:  Kevin [name], DOB 09-05-1941, MRN [id number]  PCP:  [name], MD  Cardiologist:  Dr. [name]    Chief Complaint  Patient presents with   Pre-op Exam    Pre-op   History of Present Illness: Kevin [name] is a 79 y.o. male who presents for pre op eval for  Bladder biopsy and TURP with Dr. Gloriann Loan.   Pt may be off anticoag and ok to hold. Ok to go to [employer] .   history of hypertension, DM, CVA, persistent atrial fibrillation on Eliquis for CHA2DS2-VASc score of 5, CKD stage III, chronic diastolic CHF, RBBB.  Patient had a CVA treated with TPA 02/2018 and was found to have new onset atrial fibrillation status post DCCV 05/05/2018.  2D echo 03/17/2018 normal LVEF 60 to 65% with moderate left atrial enlargement.  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     Patient last saw Dr. [name] 07/19/2019 at which time his weight was up 18 pounds but there was no evidence of fluid overload and no changes made.   04/09/2020 visit for clearance for colonoscopy. He was complaining of shortness of breath with walking 30 feet but could walk on the treadmill 30 minutes daily without a problem. Helps with short of breath bending over and doing yard work.Echo 04/24/20 normal LVEF 55% with mild LVH, lexiscan, no ischemia, low risk. Decreased his toprol because of bradycardia.  Referred to pulmonary for lung nodule.    Last visit  comes in for f/u. He's wondering if his shortness of breath could be residual from covid19 in Nov. Very sick for 2 weeks but not hospitalized. Brings in list of pulse/pulse ox and BP's. O2 sats drop to high 80's when on treadmill but come back up. BP and pulse stable on lower dose toprol. Going on a [place] cruise in Sept.  Last seen in 04/2020  Toprol decreased to 50 daily. Moderate coronary calcification seen on CT in 07/2020 and this year in march and July but neg stress  test.   No chest pain or any unusual SOB, has had dyspnea since COVID infection.  No recent a fib. He is on eliquis, there was continued bleeding with xarelto.  Able to do activities without issues.  Past Medical History:  Diagnosis Date   A-fib ([place]) 03/20/2018   Acute blood loss anemia    Benign essential HTN    Benign prostatic hyperplasia with urinary retention    Cerebrovascular accident (CVA) ([place])    CKD (chronic kidney disease), stage III ([place])    Diabetes ([place]) 03/20/2018   Diabetes mellitus type 2 in nonobese [facility])    Essential hypertension 03/20/2018   Former tobacco use    H/O ulcerative colitis 1993   Hypertension    Hypokalemia    Leukocytosis    New onset atrial fibrillation ([place])    Osteomyelitis ([place])    a. 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   PAF (paroxysmal atrial fibrillation) ([place])    a. dx 03/2018 in setting of stroke, osteomyelitis.   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[place])    Stroke ([place]) 03/2018   Stroke-like episode ([place]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 03/16/2018   Tachycardia 03/20/2018   Wrist fracture    Left    Past Surgical History:  Procedure Laterality Date  AMPUTATION Left 03/24/2018   Procedure: Transmetatarsal Amputation Left Foot;  Surgeon: [name], MD;  Location: [place];  Service: Orthopedics;  Laterality: Left;   CARDIOVERSION N/A 05/05/2018   Procedure: CARDIOVERSION;  Surgeon: [name], MD;  [facility] ENDOSCOPY;  Service: Cardiovascular;  Laterality: N/A;   COLONOSCOPY  12/2017   benign, remission from ulcerative colitis in 90s   I & D EXTREMITY Left 03/17/2018   Procedure: [facility];  Surgeon: [name], MD;  Location: [place];  Service: Orthopedics;  Laterality: Left;   STUMP REVISION Left 05/10/2018   Procedure: REVISION LEFT TRANSMETATARSAL AMPUTATION;  Surgeon: [name], MD;  Location: [place];   Service: Orthopedics;  Laterality: Left;     Current Outpatient Medications  Medication Sig Dispense Refill   albuterol (VENTOLIN HFA) 108 (90 Base) MCG/ACT inhaler Inhale 2 puffs into the lungs every 6 (six) hours as needed for wheezing or shortness of breath. 24 g 1   apixaban (ELIQUIS) 5 MG TABS tablet Take 1 tablet (5 mg total) by mouth 2 (two) times daily. 60 tablet 1   diltiazem (CARDIZEM CD) 120 MG 24 hr capsule Take 1 capsule (120 mg total) by mouth daily. Please make yearly appt with Dr. [name] for August 2022 before anymore refills. Thank you 1st attempt 90 capsule 0   FLUoxetine (PROZAC) 20 MG capsule Take 20 mg by mouth daily.     furosemide (LASIX) 20 MG tablet TAKE 1 TABLET DAILY. YOU MAY TAKE AN EXTRA 20 MG TABLET FOR INCREASED SWELLING OR WEIGHT GAIN 135 tablet 2   insulin glargine (LANTUS) 100 UNIT/ML injection Inject 38 Units into the skin 2 (two) times daily.     mesalamine (LIALDA) 1.2 g EC tablet Take 4.8 g by mouth daily.     metoprolol succinate (TOPROL-XL) 50 MG 24 hr tablet Take 1 tablet (50 mg total) by mouth daily. Keep up coming appointment for future refills. 90 tablet 0   Multiple Vitamin (MULTIVITAMIN WITH MINERALS) TABS tablet Take 1 tablet by mouth daily.     rosuvastatin (CRESTOR) 10 MG tablet Take 5 mg by mouth daily.     tamsulosin (FLOMAX) 0.4 MG CAPS capsule Take 1 capsule (0.4 mg total) by mouth 2 times daily at 12 noon and 4 pm. 60 capsule 0   Tiotropium Bromide-Olodaterol (STIOLTO RESPIMAT) 2.5-2.5 MCG/ACT AERS Inhale 2 puffs into the lungs daily. 4 g 11   trimethoprim (TRIMPEX) 100 MG tablet Take 100 mg by mouth at bedtime.   11   No current facility-administered medications for this visit.    Allergies:   Patient has no known allergies.    Social History:  The patient  reports that he quit smoking about 37 years ago. His smoking use included cigarettes. He started smoking about 59 years ago. He has a 20.00 pack-year smoking history. He has never  used smokeless tobacco. He reports current alcohol use of about 4.0 standard drinks per week. He reports that he does not use drugs.   Family History:  The patient's family history includes Hypertension in his father.    ROS:  General:no colds or fevers, no weight changes Skin:no rashes or ulcers HEENT:no blurred vision, no congestion CV:see HPI PUL:see HPI GI:no diarrhea constipation or melena, no indigestion GU:no hematuria, no dysuria MS:no joint pain, no claudication Neuro:no syncope, no lightheadedness, hx of CVA Endo:+ diabetes, no thyroid disease  Wt Readings from Last 3 Encounters:  05/12/21 260 lb 12.8 oz (118.3 kg)  01/29/21 256 lb 6.4 oz (116.3 kg)  01/06/21 254 lb (115.2 kg)     PHYSICAL EXAM: VS:  BP 130/60   Pulse 73   Ht 6' 2"  (1.88 m)   Wt 260 lb 12.8 oz (118.3 kg)   SpO2 93%   BMI 33.48 kg/m  , BMI Body mass index is 33.48 kg/m. General:Pleasant affect, NAD Skin:Warm and dry, brisk capillary refill HEENT:normocephalic, sclera clear, mucus membranes moist Neck:supple, no JVD, no bruits - raised area on neck may be ingrown hair but has scab, wife to keep an eye on it.  Heart:S1S2 RRR without murmur, gallup, rub or click Lungs:clear without rales, rhonchi, or wheezes TDS:KAJG, non tender, + BS, do not palpate liver spleen or masses Ext:no lower ext edema, 2+ pedal pulses, 2+ radial pulses Neuro:alert and oriented X3, MAE, follows commands, + facial symmetry    EKG:  EKG is ordered today. The ekg ordered today demonstrates SR at 73 RBBB and no acute changes.   Recent Labs: 04/09/2021: Hemoglobin 14.6; Platelets 261    Lipid Panel    Component Value Date/Time   CHOL 75 03/17/2018 0339   TRIG 86 08/19/2019 2150   HDL 13 (L) 03/17/2018 0339   CHOLHDL 5.8 03/17/2018 0339   VLDL 23 03/17/2018 0339   LDLCALC 39 03/17/2018 0339       Other studies Reviewed: Additional studies/ records that were reviewed today include: .  Lexiscan 04/15/20   The  left ventricular ejection fraction is normal (55-65%). Nuclear stress EF: 56%. No T wave inversion was noted during stress. There was no ST segment deviation noted during stress. Defect 1: There is a medium defect of moderate severity present in the basal inferior, mid inferior, apical inferior and apex location. This is a low risk study.   Medium size, moderate intensity fixed (SDS 0) inferior perfusion defect, suggestive of RBBB artifact.  No significant reversible ischemia. LVEF 56% with normal wall motion. This is a low risk study.     Echo 04/24/20 IMPRESSIONS     1. Abnormal septal motion . Left ventricular ejection fraction, by  estimation, is 55%. The left ventricle has low normal function. The left  ventricle has no regional wall motion abnormalities. The left ventricular  internal cavity size was mildly  dilated. There is mild left ventricular hypertrophy. Left ventricular  diastolic parameters were normal.   2. Right ventricular systolic function is normal. The right ventricular  size is normal.   3. Left atrial size was moderately dilated.   4. The mitral valve is normal in structure. Trivial mitral valve  regurgitation. No evidence of mitral stenosis.   5. The aortic valve is normal in structure. Aortic valve regurgitation is  not visualized. Mild aortic valve sclerosis is present, with no evidence  of aortic valve stenosis.   6. The inferior vena cava is normal in size with greater than 50%  respiratory variability, suggesting right atrial pressure of 3 mmHg.   FINDINGS   Left Ventricle: Abnormal septal motion. Left ventricular ejection  fraction, by estimation, is 55%. The left ventricle has low normal  function. The left ventricle has no regional wall motion abnormalities.  The left ventricular internal cavity size was  mildly dilated. There is mild left ventricular hypertrophy. Left  ventricular diastolic parameters were normal.   Right Ventricle: The right  ventricular size is normal. No increase in  right ventricular wall thickness. Right ventricular systolic function is normal.  ASSESSMENT AND PLAN:  1.  Pre-op  eval for TURP and bladder biopsy in near future with Dr. Gloriann Loan with urology.  Ok to hold eliquis 2-3 days prior to procedure.  Resume afterwards when safe.  Pt is  moderate risk with no no known obstructing CAD and neg nuc last year and no angina, but he has had TIA and on insulin for diabetes.  Discussed with pt that anesthesia may increase chance of atrial fib that he has had though it may not.  Meets 4 METS of activity.  2.  PAF maintaining SR on EKG on eliquis with instructions as above.  3.  Chronic diastolic HF euvolemic   4. HTN stable  5.  CKD 3 B followed by PCP  6.  Hx of CVA  7. DM-2 per PCP  Follow up with Dr. [name] in 6 months.    Current medicines are reviewed with the patient today.  The patient Has no concerns regarding medicines.  The following changes have been made:  See above Labs/ tests ordered today include:see above  Disposition:   FU:  see above  Signed, Cecilie Kicks, NP  05/12/2021 2:08 PM    [facility] [place], [place], [address] [place], [place] Phone: [phone]; Fax: [phone]

## 2021-05-12 ENCOUNTER — Ambulatory Visit (INDEPENDENT_AMBULATORY_CARE_PROVIDER_SITE_OTHER): Payer: Medicare Other | Admitting: Cardiology

## 2021-05-12 ENCOUNTER — Other Ambulatory Visit: Payer: Self-pay

## 2021-05-12 ENCOUNTER — Encounter: Payer: Self-pay | Admitting: Cardiology

## 2021-05-12 VITALS — BP 130/60 | HR 73 | Ht 74.0 in | Wt 260.8 lb

## 2021-05-12 DIAGNOSIS — E1159 Type 2 diabetes mellitus with other circulatory complications: Secondary | ICD-10-CM

## 2021-05-12 DIAGNOSIS — I480 Paroxysmal atrial fibrillation: Secondary | ICD-10-CM

## 2021-05-12 DIAGNOSIS — N1832 Chronic kidney disease, stage 3b: Secondary | ICD-10-CM

## 2021-05-12 DIAGNOSIS — I5032 Chronic diastolic (congestive) heart failure: Secondary | ICD-10-CM

## 2021-05-12 DIAGNOSIS — I639 Cerebral infarction, unspecified: Secondary | ICD-10-CM

## 2021-05-12 DIAGNOSIS — Z01818 Encounter for other preprocedural examination: Secondary | ICD-10-CM

## 2021-05-12 DIAGNOSIS — I10 Essential (primary) hypertension: Secondary | ICD-10-CM | POA: Diagnosis not present

## 2021-05-12 NOTE — Patient Instructions (Addendum)
Medication Instructions:  Your physician recommends that you continue on your current medications as directed. Please refer to the Current Medication list given to you today.  *If you need a refill on your cardiac medications before your next appointment, please call your pharmacy*   Lab Work: None ordered  If you have labs (blood work) drawn today and your tests are completely normal, you will receive your results only by: [place] (if you have MyChart) OR A paper copy in the mail If you have any lab test that is abnormal or we need to change your treatment, we will call you to review the results.   Testing/Procedures: None ordered   Follow-Up: At [facility], you and your health needs are our priority.  As part of our continuing mission to provide you with exceptional heart care, we have created designated Provider Care Teams.  These Care Teams include your primary Cardiologist (physician) and Advanced Practice Providers (APPs -  Physician Assistants and Nurse Practitioners) who all work together to provide you with the care you need, when you need it.  We recommend signing up for the patient portal called "MyChart".  Sign up information is provided on this After Visit Summary.  MyChart is used to connect with patients for Virtual Visits (Telemedicine).  Patients are able to view lab/test results, encounter notes, upcoming appointments, etc.  Non-urgent messages can be sent to your provider as well.   To learn more about what you can do with MyChart, go to [email or web address].    Your next appointment:   6 month(s)  The format for your next appointment:   In Person  Provider:   You may see Lauree Chandler, MD or one of the following Advanced Practice Providers on your designated Care Team:   Melina Copa, PA-C Ermalinda Barrios, PA-C   Other Instructions

## 2021-05-14 ENCOUNTER — Other Ambulatory Visit: Payer: Self-pay

## 2021-05-14 ENCOUNTER — Ambulatory Visit (INDEPENDENT_AMBULATORY_CARE_PROVIDER_SITE_OTHER): Payer: Medicare Other | Admitting: Physician Assistant

## 2021-05-14 ENCOUNTER — Encounter: Payer: Self-pay | Admitting: Orthopedic Surgery

## 2021-05-14 DIAGNOSIS — Z89432 Acquired absence of left foot: Secondary | ICD-10-CM

## 2021-05-14 NOTE — Progress Notes (Signed)
Office Visit Note   Patient: Kevin [name]           Date of Birth: July 02, 1941           MRN: [id number] Visit Date: 05/14/2021              Requested by: [name], MD [address] [place],  [place] [address] PCP: [name], MD  Chief Complaint  Patient presents with   Left Foot - Pain      HPI: Patient follows up for his left foot.  He is status post transmetatarsal amputation with a ulcer immediately MTP joint medially.  He did receive his orthotics and has been wearing appropriately stiff shoes.  Assessment & Plan: Visit Diagnoses: No diagnosis found.  Plan: Patient does have doughnuts at home I have encouraged him to use these with his orthotic.  I have also once again discussed with him the importance of Achilles stretching he is going to keep working on this.  Follow-up in 4 weeks  Follow-Up Instructions: No follow-ups on file.   Ortho Exam  Patient is alert, oriented, no adenopathy, well-dressed, normal affect, normal respiratory effort. He has a grade 1 Wagner ulcer surrounded by callus on the plantar medial forefoot.  There is no cellulitis it does not probe deeply it does not tunnel.  After obtaining consent this was debrided to healthy surfaces a Band-Aid was applied with some mupirocin.  Imaging: No results found. No images are attached to the encounter.  Labs: Lab Results  Component Value Date   HGBA1C 8.7 (H) 08/20/2019   HGBA1C 10.6 (H) 12/17/2018   HGBA1C 7.6 (H) 03/17/2018   CRP 0.8 08/24/2019   CRP <0.8 08/23/2019   CRP 2.3 (H) 08/22/2019   REPTSTATUS 08/23/2019 FINAL 08/20/2019   GRAMSTAIN  03/17/2018    MODERATE WBC PRESENT, PREDOMINANTLY PMN MODERATE GRAM POSITIVE COCCI FEW GRAM NEGATIVE RODS    CULT >=100,000 COLONIES/mL ENTEROCOCCUS FAECALIS (A) 08/20/2019   LABORGA ENTEROCOCCUS FAECALIS (A) 08/20/2019     Lab Results  Component Value Date   ALBUMIN 3.3 (L) 08/24/2019   ALBUMIN 3.2 (L) 08/23/2019    ALBUMIN 3.4 (L) 08/22/2019    Lab Results  Component Value Date   MG 2.1 08/24/2019   MG 2.1 08/23/2019   MG 2.1 08/22/2019   No results found for: VD25OH  No results found for: PREALBUMIN CBC EXTENDED Latest Ref Rng & Units 04/09/2021 08/24/2019 08/23/2019  WBC 3.4 - 10.8 x10E3/uL 10.3 11.9(H) 10.7(H)  RBC 4.14 - 5.80 x10E6/uL 4.74 4.50 4.82  HGB 13.0 - 17.7 g/dL 14.6 13.6 14.5  HCT 37.5 - 51.0 % 43.7 40.2 43.5  PLT 150 - 450 x10E3/uL 261 275 235  NEUTROABS 1.7 - 7.7 K/uL - 8.7(H) 8.5(H)  LYMPHSABS 0.7 - 4.0 K/uL - 2.0 1.5     There is no height or weight on file to calculate BMI.  Orders:  No orders of the defined types were placed in this encounter.  No orders of the defined types were placed in this encounter.    Procedures: No procedures performed  Clinical Data: No additional findings.  ROS:  All other systems negative, except as noted in the HPI. Review of Systems  Objective: Vital Signs: There were no vitals taken for this visit.  Specialty Comments:  No specialty comments available.  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   Chronic kidney disease, stage 3b ([place]) 08/20/2019   Acute respiratory failure with hypoxia ([place]) 08/20/2019   Pneumonia due to COVID-19 virus 08/19/2019   UTI (urinary tract infection) 12/15/2018   Chronic diastolic CHF (congestive heart failure) ([place]) 12/15/2018   HLD (hyperlipidemia) 12/15/2018   Dehiscence of amputation stump ([place])    Renal insufficiency 05/06/2018   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   Debility 03/31/2018   Urinary retention with incomplete bladder emptying 03/31/2018   History of transmetatarsal amputation of left foot (HCC)    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  (HCC)    Status post transmetatarsal amputation of left foot (HCC)    Postoperative pain    Benign prostatic hyperplasia with urinary retention    New onset atrial fibrillation (HCC)    Incontinence of feces    Cerebrovascular accident (CVA) ([place])    Benign essential HTN    Diabetes mellitus type 2 in nonobese ([place])    Hypokalemia    Paroxysmal atrial fibrillation (HCC)    Leukocytosis    SIRS (systemic inflammatory response syndrome) ([place])    Essential hypertension 03/20/2018   Tachycardia 03/20/2018   Sepsis ([place]) 03/20/2018   Diabetes ([place]) 03/20/2018   Subacute osteomyelitis, left ankle and foot ([place]) 03/20/2018   A-fib ([place]) 03/20/2018   Stroke-like episode ([place]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 03/16/2018   Strain of left tibialis anterior muscle 12/19/2015   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   Fracture of radius, distal, left, closed 12/19/2012   H/O ulcerative colitis 1993   Past Medical History:  Diagnosis Date   A-fib ([place]) 03/20/2018   Acute blood loss anemia    Benign essential HTN    Benign prostatic hyperplasia with urinary retention    Cerebrovascular accident (CVA) ([place])    CKD (chronic kidney disease), stage III ([place])    Diabetes ([place]) 03/20/2018   Diabetes mellitus type 2 in nonobese [facility])    Essential hypertension 03/20/2018   Former tobacco use    H/O ulcerative colitis 1993   Hypertension    Hypokalemia    Leukocytosis    New onset atrial fibrillation ([place])    Osteomyelitis ([place])    a. 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   PAF (paroxysmal atrial fibrillation) ([place])    a. dx 03/2018 in setting of stroke, osteomyelitis.   Sepsis ([place]) 03/20/2018   Stage 3 chronic kidney disease ([place])    pt/family unaware   Status post transmetatarsal amputation of left foot ([place])    Stroke ([place]) 03/2018   Stroke-like episode ([place]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 03/16/2018   Tachycardia 03/20/2018   Wrist fracture    Left    Family History  Problem Relation Age of Onset   Hypertension Father      Past Surgical History:  Procedure Laterality Date   AMPUTATION Left 03/24/2018   Procedure: Transmetatarsal Amputation Left Foot;  Surgeon: [name], MD;  Location: [facility];  Service: Orthopedics;  Laterality: Left;   CARDIOVERSION N/A 05/05/2018   Procedure: CARDIOVERSION;  Surgeon: [name], MD;  Location: [facility] ENDOSCOPY;  Service: Cardiovascular;  Laterality: N/A;   COLONOSCOPY  12/2017   benign, remission from ulcerative colitis in 90s   I & D EXTREMITY Left 03/17/2018   Procedure: [place];  Surgeon: [name], MD;  Location: [facility];  Service: Orthopedics;  Laterality: Left;   STUMP REVISION Left 05/10/2018   Procedure: REVISION LEFT  TRANSMETATARSAL AMPUTATION;  Surgeon: [name], MD;  Location: [facility];  Service: Orthopedics;  Laterality: Left;   Social History   Occupational History   Not on file  Tobacco Use   Smoking status: Former    Packs/day: 1.00    Years: 20.00    Pack years: 20.00    Types: Cigarettes    Start date: 1963    Quit date: 1985    Years since quitting: 37.6   Smokeless tobacco: Never  Vaping Use   Vaping Use: Never used  Substance and Sexual Activity   Alcohol use: Yes    Alcohol/week: 4.0 standard drinks    Types: 4 Cans of beer per week    Comment: social    Drug use: Never   Sexual activity: Not on file

## 2021-06-08 ENCOUNTER — Telehealth: Payer: Self-pay | Admitting: Emergency Medicine

## 2021-06-08 DIAGNOSIS — R911 Solitary pulmonary nodule: Secondary | ICD-10-CM

## 2021-06-08 NOTE — Telephone Encounter (Signed)
Collene Gobble, MD  06/01/2021  5:12 PM EDT     Please let him know that he needs a repeat super D CT chest in Jan 2023 and then OV with me to review. RB

## 2021-06-09 NOTE — Telephone Encounter (Signed)
Spoke with the pt  I let him know recs per RB  He verbalized understanding  I have placed order for repeat Super D and recall for Jan 2023

## 2021-06-21 ENCOUNTER — Other Ambulatory Visit: Payer: Self-pay | Admitting: Physician Assistant

## 2021-06-24 ENCOUNTER — Other Ambulatory Visit: Payer: Self-pay | Admitting: Ophthalmology

## 2021-06-24 DIAGNOSIS — N3020 Other chronic cystitis without hematuria: Secondary | ICD-10-CM | POA: Diagnosis not present

## 2021-06-24 DIAGNOSIS — N3090 Cystitis, unspecified without hematuria: Secondary | ICD-10-CM | POA: Diagnosis not present

## 2021-06-24 DIAGNOSIS — N411 Chronic prostatitis: Secondary | ICD-10-CM | POA: Diagnosis not present

## 2021-06-24 DIAGNOSIS — N138 Other obstructive and reflux uropathy: Secondary | ICD-10-CM | POA: Diagnosis not present

## 2021-06-24 DIAGNOSIS — N3289 Other specified disorders of bladder: Secondary | ICD-10-CM | POA: Diagnosis not present

## 2021-06-24 DIAGNOSIS — N401 Enlarged prostate with lower urinary tract symptoms: Secondary | ICD-10-CM | POA: Diagnosis not present

## 2021-06-25 ENCOUNTER — Ambulatory Visit: Payer: Medicare Other | Admitting: Orthopedic Surgery

## 2021-06-29 DIAGNOSIS — R310 Gross hematuria: Secondary | ICD-10-CM | POA: Diagnosis not present

## 2021-06-29 DIAGNOSIS — N401 Enlarged prostate with lower urinary tract symptoms: Secondary | ICD-10-CM | POA: Diagnosis not present

## 2021-06-30 ENCOUNTER — Other Ambulatory Visit: Payer: Self-pay | Admitting: *Deleted

## 2021-06-30 MED ORDER — APIXABAN 5 MG PO TABS: 5.0000 mg | ORAL_TABLET | Freq: Two times a day (BID) | ORAL | 2 refills | Status: DC

## 2021-06-30 NOTE — Telephone Encounter (Signed)
Eliquis 78m paper refill request received. Patient is 80years old, weight-118.3kg, Crea-1.60 on 03/04/2021 via [facility] [place] and last seen by LCecilie Kickson 05/12/2021. Dose is appropriate based on dosing criteria. Will send in refill to requested pharmacy.

## 2021-07-07 DIAGNOSIS — R8279 Other abnormal findings on microbiological examination of urine: Secondary | ICD-10-CM | POA: Diagnosis not present

## 2021-07-07 DIAGNOSIS — R350 Frequency of micturition: Secondary | ICD-10-CM | POA: Diagnosis not present

## 2021-07-09 ENCOUNTER — Ambulatory Visit (INDEPENDENT_AMBULATORY_CARE_PROVIDER_SITE_OTHER): Payer: Medicare Other | Admitting: Orthopedic Surgery

## 2021-07-09 ENCOUNTER — Encounter: Payer: Self-pay | Admitting: Orthopedic Surgery

## 2021-07-09 DIAGNOSIS — L97521 Non-pressure chronic ulcer of other part of left foot limited to breakdown of skin: Secondary | ICD-10-CM

## 2021-07-09 DIAGNOSIS — I639 Cerebral infarction, unspecified: Secondary | ICD-10-CM

## 2021-07-09 DIAGNOSIS — Z89432 Acquired absence of left foot: Secondary | ICD-10-CM | POA: Diagnosis not present

## 2021-07-09 NOTE — Progress Notes (Signed)
Office Visit Note   Patient: Kevin [name]           Date of Birth: 1941/04/14           MRN: [id number] Visit Date: 07/09/2021              Requested by: [name], MD [address] [place],  [place] [address] PCP: [name], MD  Chief Complaint  Patient presents with   Left Foot - Follow-up      HPI: Patient is a 80 year old gentleman who is status post transmetatarsal amputation on the left.  Patient has had custom orthotics and spacer provided by [employer].  Patient states the insert has been modified and he does feel better.  Assessment & Plan: Visit Diagnoses:  1. History of transmetatarsal amputation of left foot ([place])   2. Non-pressure chronic ulcer of other part of left foot limited to breakdown of skin ([place])     Plan: Ulcer was debrided reevaluate in 4 weeks.  If not showing improvement will proceed with modification of the orthotic.  Follow-Up Instructions: Return in about 4 weeks (around 08/06/2021).   Ortho Exam  Patient is alert, oriented, no adenopathy, well-dressed, normal affect, normal respiratory effort. Examination patient's foot is plantigrade he does have an ulcer beneath the left forefoot.  After informed consent a 10 blade knife was used to debride the skin and soft tissue back to healthy viable granulation tissue.  Ulcer was initially 10 mm in diameter and 20 mm in diameter after debridement and 1 mm deep there was healthy granulation tissue no exposed bone or tendon no drainage no cellulitis.  Imaging: No results found.   Labs: Lab Results  Component Value Date   HGBA1C 8.7 (H) 08/20/2019   HGBA1C 10.6 (H) 12/17/2018   HGBA1C 7.6 (H) 03/17/2018   CRP 0.8 08/24/2019   CRP <0.8 08/23/2019   CRP 2.3 (H) 08/22/2019   REPTSTATUS 08/23/2019 FINAL 08/20/2019   GRAMSTAIN  03/17/2018    MODERATE WBC PRESENT, PREDOMINANTLY PMN MODERATE GRAM POSITIVE COCCI FEW GRAM NEGATIVE RODS    CULT >=100,000 COLONIES/mL  ENTEROCOCCUS FAECALIS (A) 08/20/2019   LABORGA ENTEROCOCCUS FAECALIS (A) 08/20/2019     Lab Results  Component Value Date   ALBUMIN 3.3 (L) 08/24/2019   ALBUMIN 3.2 (L) 08/23/2019   ALBUMIN 3.4 (L) 08/22/2019    Lab Results  Component Value Date   MG 2.1 08/24/2019   MG 2.1 08/23/2019   MG 2.1 08/22/2019   No results found for: VD25OH  No results found for: PREALBUMIN CBC EXTENDED Latest Ref Rng & Units 04/09/2021 08/24/2019 08/23/2019  WBC 3.4 - 10.8 x10E3/uL 10.3 11.9(H) 10.7(H)  RBC 4.14 - 5.80 x10E6/uL 4.74 4.50 4.82  HGB 13.0 - 17.7 g/dL 14.6 13.6 14.5  HCT 37.5 - 51.0 % 43.7 40.2 43.5  PLT 150 - 450 x10E3/uL 261 275 235  NEUTROABS 1.7 - 7.7 K/uL - 8.7(H) 8.5(H)  LYMPHSABS 0.7 - 4.0 K/uL - 2.0 1.5     There is no height or weight on file to calculate BMI.  Orders:  No orders of the defined types were placed in this encounter.  No orders of the defined types were placed in this encounter.    Procedures: No procedures performed  Clinical Data: No additional findings.  ROS:  All other systems negative, except as noted in the HPI. Review of Systems  Objective: Vital Signs: There were no vitals taken for this visit.  Specialty Comments:  No specialty comments available.  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   Chronic kidney disease, stage 3b ([place]) 08/20/2019   Acute respiratory failure with hypoxia ([place]) 08/20/2019   Pneumonia due to COVID-19 virus 08/19/2019   UTI (urinary tract infection) 12/15/2018   Chronic diastolic CHF (congestive heart failure) ([place]) 12/15/2018   HLD (hyperlipidemia) 12/15/2018   Dehiscence of amputation stump ([place])    Renal insufficiency 05/06/2018   Type 2 diabetes mellitus with vascular disease ([place]) 05/06/2018   Debility  03/31/2018   Urinary retention with incomplete bladder emptying 03/31/2018   History of transmetatarsal amputation of left foot (HCC)    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 (HCC)    Status post transmetatarsal amputation of left foot (HCC)    Postoperative pain    Benign prostatic hyperplasia with urinary retention    New onset atrial fibrillation (HCC)    Incontinence of feces    Cerebrovascular accident (CVA) ([place])    Benign essential HTN    Diabetes mellitus type 2 in nonobese ([place])    Hypokalemia    Paroxysmal atrial fibrillation (HCC)    Leukocytosis    SIRS (systemic inflammatory response syndrome) ([place])    Essential hypertension 03/20/2018   Tachycardia 03/20/2018   Sepsis ([place]) 03/20/2018   Diabetes ([place]) 03/20/2018   Subacute osteomyelitis, left ankle and foot ([place]) 03/20/2018   A-fib ([place]) 03/20/2018   Stroke-like episode ([place]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 03/16/2018   Strain of left tibialis anterior muscle 12/19/2015   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   Fracture of radius, distal, left, closed 12/19/2012   H/O ulcerative colitis 1993   Past Medical History:  Diagnosis Date   A-fib ([place]) 03/20/2018   Acute blood loss anemia    Benign essential HTN    Benign prostatic hyperplasia with urinary retention    Cerebrovascular accident (CVA) ([place])    CKD (chronic kidney disease), stage III ([place])    Diabetes ([place]) 03/20/2018   Diabetes mellitus type 2 in nonobese [facility])    Essential hypertension 03/20/2018   Former tobacco use    H/O ulcerative colitis 1993   Hypertension    Hypokalemia    Leukocytosis    New onset atrial fibrillation ([place])    Osteomyelitis ([place])    a. 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   PAF (paroxysmal atrial fibrillation) ([place])    a. dx 03/2018 in setting of stroke, osteomyelitis.   Sepsis ([place]) 03/20/2018   Stage 3 chronic kidney disease ([place])    pt/family unaware   Status post transmetatarsal amputation of left foot ([place])    Stroke  ([place]) 03/2018   Stroke-like episode ([place]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 03/16/2018   Tachycardia 03/20/2018   Wrist fracture    Left    Family History  Problem Relation Age of Onset   Hypertension Father     Past Surgical History:  Procedure Laterality Date   AMPUTATION Left 03/24/2018   Procedure: Transmetatarsal Amputation Left Foot;  Surgeon: [name], MD;  Location: [facility];  Service: Orthopedics;  Laterality: Left;   CARDIOVERSION N/A 05/05/2018   Procedure: CARDIOVERSION;  Surgeon: [name], MD;  Location[facility] ENDOSCOPY;  Service: Cardiovascular;  Laterality: N/A;   COLONOSCOPY  12/2017   benign, remission from ulcerative colitis in 90s   I & D EXTREMITY Left 03/17/2018   Procedure: IRRIGATION AND  DEBRIDEMENT FOOT;  Surgeon: [name], MD;  Location: [facility];  Service: Orthopedics;  Laterality: Left;   STUMP REVISION Left 05/10/2018   Procedure: REVISION LEFT TRANSMETATARSAL AMPUTATION;  Surgeon: [name], MD;  Location: [facility];  Service: Orthopedics;  Laterality: Left;   Social History   Occupational History   Not on file  Tobacco Use   Smoking status: Former    Packs/day: 1.00    Years: 20.00    Pack years: 20.00    Types: Cigarettes    Start date: 1963    Quit date: 1985    Years since quitting: 37.8   Smokeless tobacco: Never  Vaping Use   Vaping Use: Never used  Substance and Sexual Activity   Alcohol use: Yes    Alcohol/week: 4.0 standard drinks    Types: 4 Cans of beer per week    Comment: social    Drug use: Never   Sexual activity: Not on file

## 2021-07-29 DIAGNOSIS — K5100 Ulcerative (chronic) pancolitis without complications: Secondary | ICD-10-CM | POA: Diagnosis not present

## 2021-08-01 DIAGNOSIS — Z23 Encounter for immunization: Secondary | ICD-10-CM | POA: Diagnosis not present

## 2021-08-13 ENCOUNTER — Ambulatory Visit (INDEPENDENT_AMBULATORY_CARE_PROVIDER_SITE_OTHER): Payer: Medicare Other | Admitting: Orthopedic Surgery

## 2021-08-13 ENCOUNTER — Other Ambulatory Visit: Payer: Self-pay

## 2021-08-13 DIAGNOSIS — I639 Cerebral infarction, unspecified: Secondary | ICD-10-CM

## 2021-08-13 DIAGNOSIS — Z89432 Acquired absence of left foot: Secondary | ICD-10-CM | POA: Diagnosis not present

## 2021-08-13 DIAGNOSIS — L03116 Cellulitis of left lower limb: Secondary | ICD-10-CM

## 2021-08-13 DIAGNOSIS — I872 Venous insufficiency (chronic) (peripheral): Secondary | ICD-10-CM

## 2021-08-13 DIAGNOSIS — L97521 Non-pressure chronic ulcer of other part of left foot limited to breakdown of skin: Secondary | ICD-10-CM

## 2021-08-13 MED ORDER — DOXYCYCLINE HYCLATE 100 MG PO TABS: 100.0000 mg | ORAL_TABLET | Freq: Two times a day (BID) | ORAL | 0 refills | Status: DC

## 2021-08-14 ENCOUNTER — Encounter: Payer: Self-pay | Admitting: Orthopedic Surgery

## 2021-08-14 NOTE — Progress Notes (Signed)
Office Visit Note   Patient: Kevin [name]           Date of Birth: April 10, 1941           MRN: [id number] Visit Date: 08/13/2021              Requested by: [name], MD [address] [place],  [place] [address] PCP: [name], MD  Chief Complaint  Patient presents with   Left Foot - Follow-up    S/p left TMA      HPI: Patient is a 80 year old gentleman who is status post left transmetatarsal amputation 3 years ago.  He is going to [employer] for his orthotics.  Patient complains of a ulcer proximal to the medial malleolus in the left leg.  Patient states that it is red denies any heat denies any drainage.  Assessment & Plan: Visit Diagnoses:  1. History of transmetatarsal amputation of left foot (HCC)   2. Venous stasis dermatitis of both lower extremities   3. Cellulitis of left lower leg     Plan: Prescription was sent for doxycycline recommended compression stockings.  Follow-Up Instructions: Return in about 3 weeks (around 09/03/2021).   Ortho Exam  Patient is alert, oriented, no adenopathy, well-dressed, normal affect, normal respiratory effort. Examination patient has cellulitis and dermatitis about 5 cm in diameter proximal to the medial malleolus left leg.  There is no ascending cellulitis.  Patient also has a ulcer over the residual limb.  After informed consent a 10 blade knife was used to debride the skin and soft tissue back to healthy viable tissue the ulcer before debridement is 1 cm diameter after debridement the ulcer is 2 cm in diameter.  1 cm deep.  Imaging: No results found. No images are attached to the encounter.  Labs: Lab Results  Component Value Date   HGBA1C 8.7 (H) 08/20/2019   HGBA1C 10.6 (H) 12/17/2018   HGBA1C 7.6 (H) 03/17/2018   CRP 0.8 08/24/2019   CRP <0.8 08/23/2019   CRP 2.3 (H) 08/22/2019   REPTSTATUS 08/23/2019 FINAL 08/20/2019   GRAMSTAIN  03/17/2018    MODERATE WBC PRESENT, PREDOMINANTLY  PMN MODERATE GRAM POSITIVE COCCI FEW GRAM NEGATIVE RODS    CULT >=100,000 COLONIES/mL ENTEROCOCCUS FAECALIS (A) 08/20/2019   LABORGA ENTEROCOCCUS FAECALIS (A) 08/20/2019     Lab Results  Component Value Date   ALBUMIN 3.3 (L) 08/24/2019   ALBUMIN 3.2 (L) 08/23/2019   ALBUMIN 3.4 (L) 08/22/2019    Lab Results  Component Value Date   MG 2.1 08/24/2019   MG 2.1 08/23/2019   MG 2.1 08/22/2019   No results found for: VD25OH  No results found for: PREALBUMIN CBC EXTENDED Latest Ref Rng & Units 04/09/2021 08/24/2019 08/23/2019  WBC 3.4 - 10.8 x10E3/uL 10.3 11.9(H) 10.7(H)  RBC 4.14 - 5.80 x10E6/uL 4.74 4.50 4.82  HGB 13.0 - 17.7 g/dL 14.6 13.6 14.5  HCT 37.5 - 51.0 % 43.7 40.2 43.5  PLT 150 - 450 x10E3/uL 261 275 235  NEUTROABS 1.7 - 7.7 K/uL - 8.7(H) 8.5(H)  LYMPHSABS 0.7 - 4.0 K/uL - 2.0 1.5     There is no height or weight on file to calculate BMI.  Orders:  No orders of the defined types were placed in this encounter.  Meds ordered this encounter  Medications   doxycycline (VIBRA-TABS) 100 MG tablet    Sig: Take 1 tablet (100 mg total) by mouth 2 (two) times daily.    Dispense:  20 tablet    Refill:  0     Procedures: No procedures performed  Clinical Data: No additional findings.  ROS:  All other systems negative, except as noted in the HPI. Review of Systems  Objective: Vital Signs: There were no vitals taken for this visit.  Specialty Comments:  No specialty comments available.  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   Chronic kidney disease, stage 3b ([place]) 08/20/2019   Acute respiratory failure with hypoxia ([place]) 08/20/2019   Pneumonia due to COVID-19 virus 08/19/2019   UTI (urinary tract infection) 12/15/2018   Chronic diastolic CHF (congestive heart  failure) ([place]) 12/15/2018   HLD (hyperlipidemia) 12/15/2018   Dehiscence of amputation stump ([place])    Renal insufficiency 05/06/2018   Type 2 diabetes mellitus with vascular disease ([place]) 05/06/2018   Debility 03/31/2018   Urinary retention with incomplete bladder emptying 03/31/2018   History of transmetatarsal amputation of left foot (HCC)    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 (HCC)    Status post transmetatarsal amputation of left foot (HCC)    Postoperative pain    Benign prostatic hyperplasia with urinary retention    New onset atrial fibrillation (HCC)    Incontinence of feces    Cerebrovascular accident (CVA) ([place])    Benign essential HTN    Diabetes mellitus type 2 in nonobese ([place])    Hypokalemia    Paroxysmal atrial fibrillation (HCC)    Leukocytosis    SIRS (systemic inflammatory response syndrome) ([place])    Essential hypertension 03/20/2018   Tachycardia 03/20/2018   Sepsis ([place]) 03/20/2018   Diabetes ([place]) 03/20/2018   Subacute osteomyelitis, left ankle and foot ([place]) 03/20/2018   A-fib ([place]) 03/20/2018   Stroke-like episode ([place]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 03/16/2018   Strain of left tibialis anterior muscle 12/19/2015   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   Fracture of radius, distal, left, closed 12/19/2012   H/O ulcerative colitis 1993   Past Medical History:  Diagnosis Date   A-fib ([place]) 03/20/2018   Acute blood loss anemia    Benign essential HTN    Benign prostatic hyperplasia with urinary retention    Cerebrovascular accident (CVA) ([place])    CKD (chronic kidney disease), stage III ([place])    Diabetes ([place]) 03/20/2018   Diabetes mellitus type 2 in nonobese [facility])    Essential hypertension 03/20/2018   Former tobacco use    H/O ulcerative colitis 1993   Hypertension    Hypokalemia    Leukocytosis    New onset atrial fibrillation ([place])    Osteomyelitis ([place])    a. 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   PAF (paroxysmal atrial fibrillation)  ([place])    a. dx 03/2018 in setting of stroke, osteomyelitis.   Sepsis ([place]) 03/20/2018   Stage 3 chronic kidney disease ([place])    pt/family unaware   Status post transmetatarsal amputation of left foot ([place])    Stroke ([place]) 03/2018   Stroke-like episode ([place]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 03/16/2018   Tachycardia 03/20/2018   Wrist fracture    Left    Family History  Problem Relation Age of Onset   Hypertension Father     Past Surgical History:  Procedure Laterality Date   AMPUTATION Left 03/24/2018   Procedure: Transmetatarsal Amputation Left Foot;  Surgeon: [name], MD;  Location: [facility];  Service: Orthopedics;  Laterality: Left;   CARDIOVERSION N/A 05/05/2018   Procedure: CARDIOVERSION;  Surgeon: [name], MD;  Location: [facility] ENDOSCOPY;  Service: Cardiovascular;  Laterality: N/A;   COLONOSCOPY  12/2017   benign, remission from ulcerative colitis in 90s   I & D EXTREMITY Left 03/17/2018   Procedure: [place];  Surgeon: [name], MD;  Location: [facility];  Service: Orthopedics;  Laterality: Left;   STUMP REVISION Left 05/10/2018   Procedure: REVISION LEFT TRANSMETATARSAL AMPUTATION;  Surgeon: [name], MD;  Location: [facility];  Service: Orthopedics;  Laterality: Left;   Social History   Occupational History   Not on file  Tobacco Use   Smoking status: Former    Packs/day: 1.00    Years: 20.00    Pack years: 20.00    Types: Cigarettes    Start date: 1963    Quit date: 1985    Years since quitting: 37.9   Smokeless tobacco: Never  Vaping Use   Vaping Use: Never used  Substance and Sexual Activity   Alcohol use: Yes    Alcohol/week: 4.0 standard drinks    Types: 4 Cans of beer per week    Comment: social    Drug use: Never   Sexual activity: Not on file

## 2021-09-03 ENCOUNTER — Ambulatory Visit (INDEPENDENT_AMBULATORY_CARE_PROVIDER_SITE_OTHER): Payer: Medicare Other | Admitting: Orthopedic Surgery

## 2021-09-03 DIAGNOSIS — L97521 Non-pressure chronic ulcer of other part of left foot limited to breakdown of skin: Secondary | ICD-10-CM | POA: Diagnosis not present

## 2021-09-03 DIAGNOSIS — I872 Venous insufficiency (chronic) (peripheral): Secondary | ICD-10-CM | POA: Diagnosis not present

## 2021-09-03 DIAGNOSIS — I639 Cerebral infarction, unspecified: Secondary | ICD-10-CM

## 2021-09-07 ENCOUNTER — Encounter: Payer: Self-pay | Admitting: Cardiovascular Disease

## 2021-09-07 ENCOUNTER — Encounter: Payer: Self-pay | Admitting: Orthopedic Surgery

## 2021-09-07 NOTE — Progress Notes (Signed)
Office Visit Note   Patient: Kevin [name]           Date of Birth: [birth year]           MRN: [id number] Visit Date: 09/03/2021              Requested by: [name], MD [address] [place],  [place] [address] PCP: [name], MD  Chief Complaint  Patient presents with   Left Foot - Follow-up      HPI: Patient is a 80 year old gentleman who presents in follow-up status post transmetatarsal amputation as well as ulceration to the left leg proximal to the medial malleolus.  Patient is currently on doxycycline and compression socks.  Patient has a small amount of bloody drainage from the foot ulcer.  Assessment & Plan: Visit Diagnoses:  1. Venous stasis dermatitis of both lower extremities   2. Non-pressure chronic ulcer of other part of left foot limited to breakdown of skin [facility])     Plan: Patient will follow-up with biotech who is currently working on his inserts.  Follow-Up Instructions: Return in about 4 weeks (around 10/01/2021).   Ortho Exam  Patient is alert, oriented, no adenopathy, well-dressed, normal affect, normal respiratory effort. Examination there is no cellulitis or venous ulcers in his legs.  He has some callus on the plantar aspect transmetatarsal amputation left foot.  The callus was pared his orthotic was modified to unload pressure from this area.  Imaging: No results found. No images are attached to the encounter.  Labs: Lab Results  Component Value Date   HGBA1C 8.7 (H) 08/20/2019   HGBA1C 10.6 (H) 12/17/2018   HGBA1C 7.6 (H) 03/17/2018   CRP 0.8 08/24/2019   CRP <0.8 08/23/2019   CRP 2.3 (H) 08/22/2019   REPTSTATUS 08/23/2019 FINAL 08/20/2019   GRAMSTAIN  03/17/2018    MODERATE WBC PRESENT, PREDOMINANTLY PMN MODERATE GRAM POSITIVE COCCI FEW GRAM NEGATIVE RODS    CULT >=100,000 COLONIES/mL ENTEROCOCCUS FAECALIS (A) 08/20/2019   LABORGA ENTEROCOCCUS FAECALIS (A) 08/20/2019     Lab Results  Component Value  Date   ALBUMIN 3.3 (L) 08/24/2019   ALBUMIN 3.2 (L) 08/23/2019   ALBUMIN 3.4 (L) 08/22/2019    Lab Results  Component Value Date   MG 2.1 08/24/2019   MG 2.1 08/23/2019   MG 2.1 08/22/2019   No results found for: VD25OH  No results found for: PREALBUMIN CBC EXTENDED Latest Ref Rng & Units 04/09/2021 08/24/2019 08/23/2019  WBC 3.4 - 10.8 x10E3/uL 10.3 11.9(H) 10.7(H)  RBC 4.14 - 5.80 x10E6/uL 4.74 4.50 4.82  HGB 13.0 - 17.7 g/dL 14.6 13.6 14.5  HCT 37.5 - 51.0 % 43.7 40.2 43.5  PLT 150 - 450 x10E3/uL 261 275 235  NEUTROABS 1.7 - 7.7 K/uL - 8.7(H) 8.5(H)  LYMPHSABS 0.7 - 4.0 K/uL - 2.0 1.5     There is no height or weight on file to calculate BMI.  Orders:  No orders of the defined types were placed in this encounter.  No orders of the defined types were placed in this encounter.    Procedures: No procedures performed  Clinical Data: No additional findings.  ROS:  All other systems negative, except as noted in the HPI. Review of Systems  Objective: Vital Signs: There were no vitals taken for this visit.  Specialty Comments:  No specialty comments available.  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   Chronic kidney disease, stage 3b ([place]) 08/20/2019   Acute respiratory failure with hypoxia ([place]) 08/20/2019   Pneumonia due to COVID-19 virus 08/19/2019   UTI (urinary tract infection) 12/15/2018   Chronic diastolic CHF (congestive heart failure) ([place]) 12/15/2018   HLD (hyperlipidemia) 12/15/2018   Dehiscence of amputation stump ([place])    Renal insufficiency 05/06/2018   Type 2 diabetes mellitus with vascular disease ([place]) 05/06/2018   Debility 03/31/2018   Urinary retention with incomplete bladder emptying 03/31/2018   History of transmetatarsal amputation of left  foot (HCC)    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 (HCC)    Status post transmetatarsal amputation of left foot (HCC)    Postoperative pain    Benign prostatic hyperplasia with urinary retention    New onset atrial fibrillation (HCC)    Incontinence of feces    Cerebrovascular accident (CVA) ([place])    Benign essential HTN    Diabetes mellitus type 2 in nonobese ([place])    Hypokalemia    Paroxysmal atrial fibrillation (HCC)    Leukocytosis    SIRS (systemic inflammatory response syndrome) ([place])    Essential hypertension 03/20/2018   Tachycardia 03/20/2018   Sepsis ([place]) 03/20/2018   Diabetes ([place]) 03/20/2018   Subacute osteomyelitis, left ankle and foot ([place]) 03/20/2018   A-fib ([place]) 03/20/2018   Stroke-like episode ([place]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 03/16/2018   Strain of left tibialis anterior muscle 12/19/2015   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   Fracture of radius, distal, left, closed 12/19/2012   H/O ulcerative colitis 1993   Past Medical History:  Diagnosis Date   A-fib ([place]) 03/20/2018   Acute blood loss anemia    Benign essential HTN    Benign prostatic hyperplasia with urinary retention    Cerebrovascular accident (CVA) ([place])    CKD (chronic kidney disease), stage III ([place])    Diabetes ([place]) 03/20/2018   Diabetes mellitus type 2 in nonobese [facility])    Essential hypertension 03/20/2018   Former tobacco use    H/O ulcerative colitis 1993   Hypertension    Hypokalemia    Leukocytosis    New onset atrial fibrillation ([place])    Osteomyelitis ([place])    a. 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   PAF (paroxysmal atrial fibrillation) ([place])    a. dx 03/2018 in setting of stroke, osteomyelitis.   Sepsis ([place]) 03/20/2018   Stage 3 chronic kidney disease ([place])    pt/family unaware   Status post transmetatarsal amputation of left foot ([place])    Stroke ([place]) 03/2018   Stroke-like episode ([place]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 03/16/2018   Tachycardia 03/20/2018   Wrist fracture    Left     Family History  Problem Relation Age of Onset   Hypertension Father     Past Surgical History:  Procedure Laterality Date   AMPUTATION Left 03/24/2018   Procedure: Transmetatarsal Amputation Left Foot;  Surgeon: [name], MD;  Location: [place];  Service: Orthopedics;  Laterality: Left;   CARDIOVERSION N/A 05/05/2018   Procedure: CARDIOVERSION;  Surgeon: [name], MD;  Location: [facility] ENDOSCOPY;  Service: Cardiovascular;  Laterality: N/A;   COLONOSCOPY  12/2017   benign, remission from ulcerative colitis in 90s   I & D EXTREMITY Left 03/17/2018   Procedure: [place];  Surgeon: [name], MD;  Location: [place];  Service: Orthopedics;  Laterality: Left;   STUMP REVISION Left 05/10/2018  Procedure: REVISION LEFT TRANSMETATARSAL AMPUTATION;  Surgeon: [name], MD;  Location: [place];  Service: Orthopedics;  Laterality: Left;   Social History   Occupational History   Not on file  Tobacco Use   Smoking status: Former    Packs/day: 1.00    Years: 20.00    Pack years: 20.00    Types: Cigarettes    Start date: 1963    Quit date: 1985    Years since quitting: 37.9   Smokeless tobacco: Never  Vaping Use   Vaping Use: Never used  Substance and Sexual Activity   Alcohol use: Yes    Alcohol/week: 4.0 standard drinks    Types: 4 Cans of beer per week    Comment: social    Drug use: Never   Sexual activity: Not on file

## 2021-09-08 DIAGNOSIS — Z23 Encounter for immunization: Secondary | ICD-10-CM | POA: Diagnosis not present

## 2021-09-15 DIAGNOSIS — E1169 Type 2 diabetes mellitus with other specified complication: Secondary | ICD-10-CM | POA: Diagnosis not present

## 2021-09-15 DIAGNOSIS — I10 Essential (primary) hypertension: Secondary | ICD-10-CM | POA: Diagnosis not present

## 2021-09-15 DIAGNOSIS — Z0000 Encounter for general adult medical examination without abnormal findings: Secondary | ICD-10-CM | POA: Diagnosis not present

## 2021-09-15 DIAGNOSIS — E1165 Type 2 diabetes mellitus with hyperglycemia: Secondary | ICD-10-CM | POA: Diagnosis not present

## 2021-09-15 DIAGNOSIS — D6869 Other thrombophilia: Secondary | ICD-10-CM | POA: Diagnosis not present

## 2021-09-15 DIAGNOSIS — D692 Other nonthrombocytopenic purpura: Secondary | ICD-10-CM | POA: Diagnosis not present

## 2021-09-15 DIAGNOSIS — Z794 Long term (current) use of insulin: Secondary | ICD-10-CM | POA: Diagnosis not present

## 2021-09-15 DIAGNOSIS — F3341 Major depressive disorder, recurrent, in partial remission: Secondary | ICD-10-CM | POA: Diagnosis not present

## 2021-09-15 DIAGNOSIS — E7800 Pure hypercholesterolemia, unspecified: Secondary | ICD-10-CM | POA: Diagnosis not present

## 2021-09-15 DIAGNOSIS — I700 Atherosclerosis of aorta: Secondary | ICD-10-CM | POA: Diagnosis not present

## 2021-09-15 DIAGNOSIS — I4891 Unspecified atrial fibrillation: Secondary | ICD-10-CM | POA: Diagnosis not present

## 2021-09-15 DIAGNOSIS — J449 Chronic obstructive pulmonary disease, unspecified: Secondary | ICD-10-CM | POA: Diagnosis not present

## 2021-09-17 ENCOUNTER — Telehealth: Payer: Self-pay | Admitting: Cardiovascular Disease

## 2021-09-17 NOTE — Telephone Encounter (Signed)
° °  Primary Cardiologist: Lauree Chandler, MD  The clinical pharmacist has reviewed the patient's past medical history, current medications, and request for multiple teeth extraction for Kevin [name]. Pre-procedural instructions for holding anti-coagulant medication are as follows:  Patient with diagnosis of A Fib on Eliquis for anticoagulation.     Procedure: MULTIPLE TEETH EXTRACTION 6 Date of procedure: TBD     CHA2DS2-VASc Score = 8  This indicates a 10.8% annual risk of stroke. The patient's score is based upon: CHF History: 1 HTN History: 1 Diabetes History: 1 Stroke History: 2 Vascular Disease History: 1 Age Score: 2 Gender Score: 0     CrCl 57 mL/min Platelet count 261K   Patient does not require pre-op antibiotics for dental procedure.   Patient is high risk off anticoagulation for extended period of time.  Recommend he hold Eliquis for 1.5 days (36 hours) prior to procedure. Should restart anticoagulation as soon as dentist deems safe.  Patient was advised that if he develops new symptoms prior to surgery to contact our office to arrange a follow-up appointment.  He verbalized understanding.  I will route this recommendation to the requesting party via Epic fax function and remove from pre-op pool.  Please call with questions.  Emmaline Life, NP-C    09/17/2021, 10:24 AM [place] [address], [address] Office [phone] Fax [phone]

## 2021-09-17 NOTE — Telephone Encounter (Signed)
° °  [facility] Pre-operative Risk Assessment    Request for surgical clearance:  What type of surgery is being performed? MULTIPLE TEETH EXTRACTION 6  When is this surgery scheduled? TBD   What type of clearance is required (medical clearance vs. Pharmacy clearance to hold med vs. Both)? PHARMACY  Are there any medications that need to be held prior to surgery and how long? [place] name and name of physician performing surgery? DR. Lucas Mallow DDS/ [facility]  What is your office phone number [phone]    7.   What is your office fax number [phone]  8.   Anesthesia type (None, local, MAC, general) ? LOCAL   DR NORTON'S OFFICE WILL ONLY BE IN THE OFFICE TODAY UNTIL NOON, THE OFFICE WILL BE CLOSED UNTIL 09/22/21  Kevin [name] 09/17/2021, 8:37 AM  _________________________________________________________________   (provider comments below)

## 2021-09-17 NOTE — Telephone Encounter (Signed)
Patient with diagnosis of A Fib on Eliquis for anticoagulation.    Procedure: MULTIPLE TEETH EXTRACTION 6 Date of procedure: TBD   CHA2DS2-VASc Score = 8  This indicates a 10.8% annual risk of stroke. The patient's score is based upon: CHF History: 1 HTN History: 1 Diabetes History: 1 Stroke History: 2 Vascular Disease History: 1 Age Score: 2 Gender Score: 0   CrCl 57 mL/min Platelet count 261K  Patient does not require pre-op antibiotics for dental procedure.  Patient is high risk off anticoagulation for extended period of time.  Recommend he hold Eliquis for 1.5 days (36 hours) prior to procedure. Should restart anticoagulation as soon as dentist deems safe.

## 2021-10-01 ENCOUNTER — Other Ambulatory Visit: Payer: Self-pay

## 2021-10-01 ENCOUNTER — Ambulatory Visit (INDEPENDENT_AMBULATORY_CARE_PROVIDER_SITE_OTHER): Payer: Medicare Other | Admitting: Orthopedic Surgery

## 2021-10-01 DIAGNOSIS — I872 Venous insufficiency (chronic) (peripheral): Secondary | ICD-10-CM | POA: Diagnosis not present

## 2021-10-07 ENCOUNTER — Ambulatory Visit
Admission: RE | Admit: 2021-10-07 | Discharge: 2021-10-07 | Disposition: A | Discharge status: Home / Self Care | Payer: Medicare Other | Source: Ambulatory Visit | Attending: Emergency Medicine | Admitting: Emergency Medicine

## 2021-10-07 DIAGNOSIS — N401 Enlarged prostate with lower urinary tract symptoms: Secondary | ICD-10-CM | POA: Diagnosis not present

## 2021-10-07 DIAGNOSIS — R911 Solitary pulmonary nodule: Secondary | ICD-10-CM

## 2021-10-07 DIAGNOSIS — R918 Other nonspecific abnormal finding of lung field: Secondary | ICD-10-CM | POA: Diagnosis not present

## 2021-10-07 DIAGNOSIS — R3914 Feeling of incomplete bladder emptying: Secondary | ICD-10-CM | POA: Diagnosis not present

## 2021-10-14 ENCOUNTER — Telehealth: Payer: Self-pay

## 2021-10-14 NOTE — Telephone Encounter (Signed)
Received CD of Super D CT ran on 10/07/21. Placed in Dr. Agustina Caroli review folder.

## 2021-10-20 DIAGNOSIS — C44321 Squamous cell carcinoma of skin of nose: Secondary | ICD-10-CM | POA: Diagnosis not present

## 2021-10-20 DIAGNOSIS — L720 Epidermal cyst: Secondary | ICD-10-CM | POA: Diagnosis not present

## 2021-10-20 DIAGNOSIS — D485 Neoplasm of uncertain behavior of skin: Secondary | ICD-10-CM | POA: Diagnosis not present

## 2021-10-30 ENCOUNTER — Other Ambulatory Visit: Payer: Self-pay | Admitting: *Deleted

## 2021-10-30 DIAGNOSIS — I480 Paroxysmal atrial fibrillation: Secondary | ICD-10-CM

## 2021-10-30 MED ORDER — APIXABAN 5 MG PO TABS: 5.0000 mg | ORAL_TABLET | Freq: Two times a day (BID) | ORAL | 2 refills | Status: DC

## 2021-10-30 MED ORDER — STIOLTO RESPIMAT 2.5-2.5 MCG/ACT IN AERS: 2.0000 | INHALATION_SPRAY | Freq: Every day | RESPIRATORY_TRACT | 11 refills | Status: DC

## 2021-10-30 MED ORDER — FUROSEMIDE 20 MG PO TABS: ORAL_TABLET | ORAL | 2 refills | Status: DC

## 2021-10-30 MED ORDER — METOPROLOL SUCCINATE ER 50 MG PO TB24: 50.0000 mg | ORAL_TABLET | Freq: Every day | ORAL | 2 refills | Status: DC

## 2021-10-30 MED ORDER — ROSUVASTATIN CALCIUM 10 MG PO TABS: 5.0000 mg | ORAL_TABLET | Freq: Every day | ORAL | 2 refills | Status: DC

## 2021-10-30 MED ORDER — DILTIAZEM HCL ER COATED BEADS 120 MG PO CP24: 120.0000 mg | ORAL_CAPSULE | Freq: Every day | ORAL | 2 refills | Status: DC

## 2021-10-30 NOTE — Telephone Encounter (Signed)
Eliquis 5mg  refill request received. Patient is 81 years old, weight-118.3kg, Crea-1.80 on 07/29/2021 via [place] from [place], [place], and last seen by Cecilie Kicks on 05/12/2021. Dose is appropriate based on dosing criteria. Will send in a refill to requested pharmacy.

## 2021-11-01 ENCOUNTER — Encounter: Payer: Self-pay | Admitting: Orthopedic Surgery

## 2021-11-01 NOTE — Progress Notes (Signed)
Office Visit Note   Patient: Kevin [name]           Date of Birth: November 10, 1940           MRN: [id number] Visit Date: 10/01/2021              Requested by: [name], MD [address] [place],  [place] [address] PCP: [name], MD  Chief Complaint  Patient presents with   Left Foot - Follow-up    Ulcer check Hx transmet amputation   Right Leg - Follow-up    Venous stasis dermatitis   Left Leg - Follow-up    Venous stasis dermatitis      HPI: Patient is a 81 year old gentleman who presents in follow-up for bilateral lower extremity venous insufficiency with dermatitis.  Patient is status post a left transmetatarsal amputation and presents with an ulcer to the left foot.  Patient states he is doing well without complaints.  Assessment & Plan: Visit Diagnoses:  1. Venous stasis dermatitis of both lower extremities     Plan: Patient is provided a prescription for biotech for double upright brace carbon plate and orthotic.  Follow-Up Instructions: Return in about 3 months (around 12/30/2021).   Ortho Exam  Patient is alert, oriented, no adenopathy, well-dressed, normal affect, normal respiratory effort. Examination of the left foot the callus was pared along the first metatarsal.  Patient has dorsiflexion to neutral there is no cellulitis no open ulcers or drainage.  Imaging: No results found. No images are attached to the encounter.  Labs: Lab Results  Component Value Date   HGBA1C 8.7 (H) 08/20/2019   HGBA1C 10.6 (H) 12/17/2018   HGBA1C 7.6 (H) 03/17/2018   CRP 0.8 08/24/2019   CRP <0.8 08/23/2019   CRP 2.3 (H) 08/22/2019   REPTSTATUS 08/23/2019 FINAL 08/20/2019   GRAMSTAIN  03/17/2018    MODERATE WBC PRESENT, PREDOMINANTLY PMN MODERATE GRAM POSITIVE COCCI FEW GRAM NEGATIVE RODS    CULT >=100,000 COLONIES/mL ENTEROCOCCUS FAECALIS (A) 08/20/2019   LABORGA ENTEROCOCCUS FAECALIS (A) 08/20/2019     Lab Results  Component Value  Date   ALBUMIN 3.3 (L) 08/24/2019   ALBUMIN 3.2 (L) 08/23/2019   ALBUMIN 3.4 (L) 08/22/2019    Lab Results  Component Value Date   MG 2.1 08/24/2019   MG 2.1 08/23/2019   MG 2.1 08/22/2019   No results found for: VD25OH  No results found for: PREALBUMIN CBC EXTENDED Latest Ref Rng & Units 04/09/2021 08/24/2019 08/23/2019  WBC 3.4 - 10.8 x10E3/uL 10.3 11.9(H) 10.7(H)  RBC 4.14 - 5.80 x10E6/uL 4.74 4.50 4.82  HGB 13.0 - 17.7 g/dL 14.6 13.6 14.5  HCT 37.5 - 51.0 % 43.7 40.2 43.5  PLT 150 - 450 x10E3/uL 261 275 235  NEUTROABS 1.7 - 7.7 K/uL - 8.7(H) 8.5(H)  LYMPHSABS 0.7 - 4.0 K/uL - 2.0 1.5     There is no height or weight on file to calculate BMI.  Orders:  No orders of the defined types were placed in this encounter.  No orders of the defined types were placed in this encounter.    Procedures: No procedures performed  Clinical Data: No additional findings.  ROS:  All other systems negative, except as noted in the HPI. Review of Systems  Objective: Vital Signs: There were no vitals taken for this visit.  Specialty Comments:  No specialty comments available.  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   Chronic kidney disease, stage 3b ([place]) 08/20/2019   Acute respiratory failure with hypoxia ([place]) 08/20/2019   Pneumonia due to COVID-19 virus 08/19/2019   UTI (urinary tract infection) 12/15/2018   Chronic diastolic CHF (congestive heart failure) ([place]) 12/15/2018   HLD (hyperlipidemia) 12/15/2018   Dehiscence of amputation stump ([place])    Renal insufficiency 05/06/2018   Type 2 diabetes mellitus with vascular disease ([place]) 05/06/2018   Debility 03/31/2018   Urinary retention with incomplete bladder emptying 03/31/2018   History of transmetatarsal amputation of left  foot (HCC)    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 (HCC)    Status post transmetatarsal amputation of left foot (HCC)    Postoperative pain    Benign prostatic hyperplasia with urinary retention    New onset atrial fibrillation (HCC)    Incontinence of feces    Cerebrovascular accident (CVA) ([place])    Benign essential HTN    Diabetes mellitus type 2 in nonobese ([place])    Hypokalemia    Paroxysmal atrial fibrillation (HCC)    Leukocytosis    SIRS (systemic inflammatory response syndrome) ([place])    Essential hypertension 03/20/2018   Tachycardia 03/20/2018   Sepsis ([place]) 03/20/2018   Diabetes ([place]) 03/20/2018   Subacute osteomyelitis, left ankle and foot ([place]) 03/20/2018   A-fib ([place]) 03/20/2018   Stroke-like episode ([place]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 03/16/2018   Strain of left tibialis anterior muscle 12/19/2015   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   Fracture of radius, distal, left, closed 12/19/2012   H/O ulcerative colitis 1993   Past Medical History:  Diagnosis Date   A-fib ([name]) 03/20/2018   Acute blood loss anemia    Benign essential HTN    Benign prostatic hyperplasia with urinary retention    Cerebrovascular accident (CVA) ([place])    CKD (chronic kidney disease), stage III ([place])    Diabetes ([place]) 03/20/2018   Diabetes mellitus type 2 in nonobese [facility])    Essential hypertension 03/20/2018   Former tobacco use    H/O ulcerative colitis 1993   Hypertension    Hypokalemia    Leukocytosis    New onset atrial fibrillation ([place])    Osteomyelitis ([place])    a. 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   PAF (paroxysmal atrial fibrillation) ([place])    a. dx 03/2018 in setting of stroke, osteomyelitis.   Sepsis ([place]) 03/20/2018   Stage 3 chronic kidney disease ([place])    pt/family unaware   Status post transmetatarsal amputation of left foot ([place])    Stroke ([place]) 03/2018   Stroke-like episode ([place]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 03/16/2018   Tachycardia 03/20/2018   Wrist fracture    Left     Family History  Problem Relation Age of Onset   Hypertension Father     Past Surgical History:  Procedure Laterality Date   AMPUTATION Left 03/24/2018   Procedure: Transmetatarsal Amputation Left Foot;  Surgeon: [name], MD;  Location: [place];  Service: Orthopedics;  Laterality: Left;   CARDIOVERSION N/A 05/05/2018   Procedure: CARDIOVERSION;  Surgeon: [name], MD;  Location: [facility] ENDOSCOPY;  Service: Cardiovascular;  Laterality: N/A;   COLONOSCOPY  12/2017   benign, remission from ulcerative colitis in 90s   I & D EXTREMITY Left 03/17/2018   Procedure: [place];  Surgeon: [name], MD;  Location: [place];  Service: Orthopedics;  Laterality: Left;   STUMP  REVISION Left 05/10/2018   Procedure: REVISION LEFT TRANSMETATARSAL AMPUTATION;  Surgeon: [name], MD;  Location: [place];  Service: Orthopedics;  Laterality: Left;   Social History   Occupational History   Not on file  Tobacco Use   Smoking status: Former    Packs/day: 1.00    Years: 20.00    Pack years: 20.00    Types: Cigarettes    Start date: 1963    Quit date: 1985    Years since quitting: 38.1   Smokeless tobacco: Never  Vaping Use   Vaping Use: Never used  Substance and Sexual Activity   Alcohol use: Yes    Alcohol/week: 4.0 standard drinks    Types: 4 Cans of beer per week    Comment: social    Drug use: Never   Sexual activity: Not on file

## 2021-11-11 DIAGNOSIS — E119 Type 2 diabetes mellitus without complications: Secondary | ICD-10-CM | POA: Diagnosis not present

## 2021-11-11 DIAGNOSIS — H353131 Nonexudative age-related macular degeneration, bilateral, early dry stage: Secondary | ICD-10-CM | POA: Diagnosis not present

## 2021-11-11 DIAGNOSIS — Z961 Presence of intraocular lens: Secondary | ICD-10-CM | POA: Diagnosis not present

## 2021-11-16 NOTE — Telephone Encounter (Signed)
[name],  Do you remember if this was mailed to the patient? It was printed on 2/3.

## 2021-11-18 ENCOUNTER — Encounter: Payer: Self-pay | Admitting: Cardiovascular Disease

## 2021-11-18 ENCOUNTER — Ambulatory Visit (INDEPENDENT_AMBULATORY_CARE_PROVIDER_SITE_OTHER): Payer: Medicare Other | Admitting: Cardiovascular Disease

## 2021-11-18 ENCOUNTER — Other Ambulatory Visit: Payer: Self-pay

## 2021-11-18 VITALS — BP 110/62 | HR 72 | Ht 74.0 in | Wt 239.8 lb

## 2021-11-18 DIAGNOSIS — I5032 Chronic diastolic (congestive) heart failure: Secondary | ICD-10-CM | POA: Diagnosis not present

## 2021-11-18 DIAGNOSIS — I480 Paroxysmal atrial fibrillation: Secondary | ICD-10-CM | POA: Diagnosis not present

## 2021-11-18 DIAGNOSIS — I10 Essential (primary) hypertension: Secondary | ICD-10-CM

## 2021-11-18 NOTE — Progress Notes (Signed)
Chief Complaint  Patient presents with   Follow-up    Atrial fibrillation   History of Present Illness: 81 yo male with history of HTN, DM, CVA, persistent atrial fibrillation on Eliquis, CKD stage 3, chronic diastolic CHF and RBBB here today for cardiac follow up. He was admitted to [facility] June 2019 with a stroke and was given TPA. He was found to have atrial fibrillation during the hospitalization. He was septic from wound of his left foot and required transmetatarsal amputation. He was started on Eliquis in June 2019 and was cardioverted to sinus in August 2019. Echo 03/17/18 with normal LV function. Nuclear stress test in July 2021 with no ischemia. Echo July 2021 with LVEF=55%, no significant valve disease.   He is here today for follow up. The patient denies any chest pain, dyspnea, palpitations, lower extremity edema, orthopnea, PND, dizziness, near syncope or syncope.   Primary Care Physician: [name], MD  Past Medical History:  Diagnosis Date   A-fib [facility]) 03/20/2018   Acute blood loss anemia    Benign essential HTN    Benign prostatic hyperplasia with urinary retention    Cerebrovascular accident (CVA) ([place])    CKD (chronic kidney disease), stage III ([place])    Diabetes ([place]) 03/20/2018   Diabetes mellitus type 2 in nonobese [facility])    Essential hypertension 03/20/2018   Former tobacco use    H/O ulcerative colitis 1993   Hypertension    Hypokalemia    Leukocytosis    New onset atrial fibrillation ([place])    Osteomyelitis ([place])    a. 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   PAF (paroxysmal atrial fibrillation) ([place])    a. dx 03/2018 in setting of stroke, osteomyelitis.   Sepsis ([place]) 03/20/2018   Stage 3 chronic kidney disease ([place])    pt/family unaware   Status post transmetatarsal amputation of left foot ([place])    Stroke ([place]) 03/2018   Stroke-like episode ([place]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 03/16/2018   Tachycardia 03/20/2018   Wrist fracture    Left    Past Surgical History:   Procedure Laterality Date   AMPUTATION Left 03/24/2018   Procedure: Transmetatarsal Amputation Left Foot;  Surgeon: [name], MD;  Location: [place];  Service: Orthopedics;  Laterality: Left;   CARDIOVERSION N/A 05/05/2018   Procedure: CARDIOVERSION;  Surgeon: [name], MD;  Location: [facility] ENDOSCOPY;  Service: Cardiovascular;  Laterality: N/A;   COLONOSCOPY  12/2017   benign, remission from ulcerative colitis in 90s   I & D EXTREMITY Left 03/17/2018   Procedure: [place];  Surgeon: [name], MD;  Location: [place];  Service: Orthopedics;  Laterality: Left;   STUMP REVISION Left 05/10/2018   Procedure: REVISION LEFT TRANSMETATARSAL AMPUTATION;  Surgeon: [name], MD;  Location: [place];  Service: Orthopedics;  Laterality: Left;    Current Outpatient Medications  Medication Sig Dispense Refill   albuterol (VENTOLIN HFA) 108 (90 Base) MCG/ACT inhaler Inhale 2 puffs into the lungs every 6 (six) hours as needed for wheezing or shortness of breath. 24 g 1   apixaban (ELIQUIS) 5 MG TABS tablet Take 1 tablet (5 mg total) by mouth 2 (two) times daily. 180 tablet 2   diltiazem (CARDIZEM CD) 120 MG 24 hr capsule Take 1 capsule (120 mg total) by mouth daily. 90 capsule 2   doxycycline (VIBRA-TABS) 100 MG tablet Take 1 tablet (100 mg total) by mouth 2 (two) times daily. 20 tablet 0   empagliflozin (JARDIANCE) 25  MG TABS tablet Take 25 mg by mouth daily.     FLUoxetine (PROZAC) 20 MG capsule Take 20 mg by mouth daily.     furosemide (LASIX) 20 MG tablet TAKE 1 TABLET DAILY. YOU MAY TAKE AN EXTRA 20 MG TABLET FOR INCREASED SWELLING OR WEIGHT GAIN 135 tablet 2   insulin glargine (LANTUS) 100 UNIT/ML injection Inject 38 Units into the skin 2 (two) times daily.     mesalamine (LIALDA) 1.2 g EC tablet Take 4.8 g by mouth daily.     metoprolol succinate (TOPROL-XL) 50 MG 24 hr tablet Take 1 tablet (50 mg total) by mouth daily. Take with or immediately following a  meal. 90 tablet 2   Multiple Vitamin (MULTIVITAMIN WITH MINERALS) TABS tablet Take 1 tablet by mouth daily.     rosuvastatin (CRESTOR) 10 MG tablet Take 0.5 tablets (5 mg total) by mouth daily. 45 tablet 2   tamsulosin (FLOMAX) 0.4 MG CAPS capsule Take 1 capsule (0.4 mg total) by mouth 2 times daily at 12 noon and 4 pm. 60 capsule 0   Tiotropium Bromide-Olodaterol (STIOLTO RESPIMAT) 2.5-2.5 MCG/ACT AERS Inhale 2 puffs into the lungs daily. 4 g 11   trimethoprim (TRIMPEX) 100 MG tablet Take 100 mg by mouth at bedtime.   11   No current facility-administered medications for this visit.    No Known Allergies  Social History   Socioeconomic History   Marital status: Married    Spouse name: Not on file   Number of children: Not on file   Years of education: Not on file   Highest education level: Bachelor's degree (e.g., BA, AB, BS)  Occupational History   Not on file  Tobacco Use   Smoking status: Former    Packs/day: 1.00    Years: 20.00    Pack years: 20.00    Types: Cigarettes    Start date: 1963    Quit date: 1985    Years since quitting: 38.1   Smokeless tobacco: Never  Vaping Use   Vaping Use: Never used  Substance and Sexual Activity   Alcohol use: Yes    Alcohol/week: 4.0 standard drinks    Types: 4 Cans of beer per week    Comment: social    Drug use: Never   Sexual activity: Not on file  Other Topics Concern   Not on file  Social History Narrative   Lives at home with his wife and two cats. Married for almost 45 years   Right handed   Caffeine: 1 cup every morning   Social Determinants of Health   Financial Resource Strain: Not on file  Food Insecurity: Not on file  Transportation Needs: Not on file  Physical Activity: Not on file  Stress: Not on file  Social Connections: Not on file  Intimate Partner Violence: Not on file    Family History  Problem Relation Age of Onset   Hypertension Father     Review of Systems:  As stated in the HPI and  otherwise negative.   BP 110/62    Pulse 72    Ht 6' 2"  (1.88 m)    Wt 239 lb 12.8 oz (108.8 kg)    SpO2 95%    BMI 30.79 kg/m   Physical Examination: General: Well developed, well nourished, NAD  HEENT: OP clear, mucus membranes moist  SKIN: warm, dry. No rashes. Neuro: No focal deficits  Musculoskeletal: Muscle strength 5/5 all ext  Psychiatric: Mood and affect normal  Neck: No  JVD, no carotid bruits, no thyromegaly, no lymphadenopathy.  Lungs:Clear bilaterally, no wheezes, rhonci, crackles Cardiovascular: Regular rate and rhythm. No murmurs, gallops or rubs. Abdomen:Soft. Bowel sounds present. Non-tender.  Extremities: No lower extremity edema. Pulses are 2 + in the bilateral DP/PT.  Echo July 2021:  1. Abnormal septal motion . Left ventricular ejection fraction, by  estimation, is 55%. The left ventricle has low normal function. The left  ventricle has no regional wall motion abnormalities. The left ventricular  internal cavity size was mildly  dilated. There is mild left ventricular hypertrophy. Left ventricular  diastolic parameters were normal.   2. Right ventricular systolic function is normal. The right ventricular  size is normal.   3. Left atrial size was moderately dilated.   4. The mitral valve is normal in structure. Trivial mitral valve  regurgitation. No evidence of mitral stenosis.   5. The aortic valve is normal in structure. Aortic valve regurgitation is  not visualized. Mild aortic valve sclerosis is present, with no evidence  of aortic valve stenosis.   6. The inferior vena cava is normal in size with greater than 50%  respiratory variability, suggesting right atrial pressure of 3 mmHg.   EKG:  EKG is not ordered today. The ekg ordered today demonstrates   Recent Labs: 04/09/2021: Hemoglobin 14.6; Platelets 261   Lipid Panel    Component Value Date/Time   CHOL 75 03/17/2018 0339   TRIG 86 08/19/2019 2150   HDL 13 (L) 03/17/2018 0339   CHOLHDL 5.8  03/17/2018 0339   VLDL 23 03/17/2018 0339   LDLCALC 39 03/17/2018 0339     Wt Readings from Last 3 Encounters:  11/18/21 239 lb 12.8 oz (108.8 kg)  05/12/21 260 lb 12.8 oz (118.3 kg)  01/29/21 256 lb 6.4 oz (116.3 kg)     Other studies Reviewed: Additional studies/ records that were reviewed today include: . Review of the above records demonstrates:    Assessment and Plan:   1. Atrial fibrillation, paroxysmal: Sinus today on exam. Continue Cardizem, Toprol and Eliquis. CHADS VASC score of 5.  2. HTN: BP is well controlled.   3. Chronic diastolic CHF: No volume overload on exam. Continue Lasix   Current medicines are reviewed at length with the patient today.  The patient does not have concerns regarding medicines.  The following changes have been made:  no change  Labs/ tests ordered today include:  No orders of the defined types were placed in this encounter.    Disposition:   FU with me  in 12 months   Signed, Lauree Chandler, MD 11/18/2021 9:12 AM    [facility] [place], [place], [place]  [address] Phone: [phone]; Fax: [phone]

## 2021-11-18 NOTE — Patient Instructions (Signed)
Medication Instructions:  °Your physician recommends that you continue on your current medications as directed. Please refer to the Current Medication list given to you today. ° °*If you need a refill on your cardiac medications before your next appointment, please call your pharmacy* ° ° °Lab Work: °NONE °If you have labs (blood work) drawn today and your tests are completely normal, you will receive your results only by: °MyChart Message (if you have MyChart) OR °A paper copy in the mail °If you have any lab test that is abnormal or we need to change your treatment, we will call you to review the results. ° ° °Testing/Procedures: °NONE ° ° °Follow-Up: °At [facility], you and your health needs are our priority.  As part of our continuing mission to provide you with exceptional heart care, we have created designated Provider Care Teams.  These Care Teams include your primary Cardiologist (physician) and Advanced Practice Providers (APPs -  Physician Assistants and Nurse Practitioners) who all work together to provide you with the care you need, when you need it. ° °Your next appointment:   °1 year(s) ° °The format for your next appointment:   °In Person ° °Provider:   °Christopher McAlhany, MD  °

## 2021-12-07 DIAGNOSIS — Z20822 Contact with and (suspected) exposure to covid-19: Secondary | ICD-10-CM | POA: Diagnosis not present

## 2021-12-16 DIAGNOSIS — E1165 Type 2 diabetes mellitus with hyperglycemia: Secondary | ICD-10-CM | POA: Diagnosis not present

## 2021-12-31 ENCOUNTER — Ambulatory Visit (INDEPENDENT_AMBULATORY_CARE_PROVIDER_SITE_OTHER): Payer: Medicare Other | Admitting: Orthopedic Surgery

## 2021-12-31 DIAGNOSIS — L97521 Non-pressure chronic ulcer of other part of left foot limited to breakdown of skin: Secondary | ICD-10-CM | POA: Diagnosis not present

## 2021-12-31 DIAGNOSIS — Z89432 Acquired absence of left foot: Secondary | ICD-10-CM | POA: Diagnosis not present

## 2021-12-31 DIAGNOSIS — I872 Venous insufficiency (chronic) (peripheral): Secondary | ICD-10-CM | POA: Diagnosis not present

## 2022-01-01 ENCOUNTER — Encounter: Payer: Self-pay | Admitting: Orthopedic Surgery

## 2022-01-01 NOTE — Progress Notes (Signed)
? ?Office Visit Note ?  ?Patient: Kevin [name]           ?Date of Birth: 16-Sep-1941           ?MRN: [id number] ?Visit Date: 12/31/2021 ?             ?Requested by: [name], MD ?[address] ?[address] ?[place],  [place] [address] ?PCP: [name], MD ? ?Chief Complaint  ?Patient presents with  ? Left Foot - Follow-up  ?  Hx transmet amputation   ? ? ? ? ?HPI: ?Patient is a 81 year old gentleman who presents in follow-up for bilateral lower extremity venous stasis dermatitis as well as a ulcer on the left foot status post transmetatarsal amputation on the left.  Patient states he feels well has no concerns. ? ?Assessment & Plan: ?Visit Diagnoses:  ?1. Venous stasis dermatitis of both lower extremities   ?2. Non-pressure chronic ulcer of other part of left foot limited to breakdown of skin ([place])   ?3. History of transmetatarsal amputation of left foot ([place])   ? ? ?Plan: Patient has an appointment with [facility] for spacer orthotics.  Discussed the possibility of double upright brace. ? ?Follow-Up Instructions: Return in about 3 months (around 04/01/2022).  ? ?Ortho Exam ? ?Patient is alert, oriented, no adenopathy, well-dressed, normal affect, normal respiratory effort. ?Examination patient has excellent dorsiflexion the ankle past 90 degrees.  He does have some bruising over the left ankle.  There is callus on the residual limb on the left foot and this was pared without complications. ? ?Imaging: ?No results found. ?No images are attached to the encounter. ? ?Labs: ?Lab Results  ?Component Value Date  ? HGBA1C 8.7 (H) 08/20/2019  ? HGBA1C 10.6 (H) 12/17/2018  ? HGBA1C 7.6 (H) 03/17/2018  ? CRP 0.8 08/24/2019  ? CRP <0.8 08/23/2019  ? CRP 2.3 (H) 08/22/2019  ? REPTSTATUS 08/23/2019 FINAL 08/20/2019  ? GRAMSTAIN  03/17/2018  ?  MODERATE WBC PRESENT, PREDOMINANTLY PMN ?MODERATE GRAM POSITIVE COCCI ?FEW GRAM NEGATIVE RODS ?  ? CULT >=100,000 COLONIES/mL ENTEROCOCCUS FAECALIS (A) 08/20/2019  ? LABORGA  ENTEROCOCCUS FAECALIS (A) 08/20/2019  ? ? ? ?Lab Results  ?Component Value Date  ? ALBUMIN 3.3 (L) 08/24/2019  ? ALBUMIN 3.2 (L) 08/23/2019  ? ALBUMIN 3.4 (L) 08/22/2019  ? ? ?Lab Results  ?Component Value Date  ? MG 2.1 08/24/2019  ? MG 2.1 08/23/2019  ? MG 2.1 08/22/2019  ? ?No results found for: VD25OH ? ?No results found for: PREALBUMIN ? ?  Latest Ref Rng & Units 04/09/2021  ? 12:00 AM 08/24/2019  ?  1:15 AM 08/23/2019  ?  1:10 AM  ?CBC EXTENDED  ?WBC 3.4 - 10.8 x10E3/uL 10.3   11.9   10.7    ?RBC 4.14 - 5.80 x10E6/uL 4.74   4.50   4.82    ?Hemoglobin 13.0 - 17.7 g/dL 14.6   13.6   14.5    ?HCT 37.5 - 51.0 % 43.7   40.2   43.5    ?Platelets 150 - 450 x10E3/uL 261   275   235    ?NEUT# 1.7 - 7.7 K/uL  8.7   8.5    ?Lymph# 0.7 - 4.0 K/uL  2.0   1.5    ? ? ? ?There is no height or weight on file to calculate BMI. ? ?Orders:  ?No orders of the defined types were placed in this encounter. ? ?No orders of the defined types were placed  in this encounter. ? ? ? Procedures: ?No procedures performed ? ?Clinical Data: ?No additional findings. ? ?ROS: ? ?All other systems negative, except as noted in the HPI. ?Review of Systems ? ?Objective: ?Vital Signs: There were no vitals taken for this visit. ? ?Specialty Comments:  ?No specialty comments available. ? ?PMFS History: ?Patient Active Problem List  ? Diagnosis Date Noted  ? Pulmonary nodules/lesions, multiple 01/08/2021  ? COPD (chronic obstructive pulmonary disease) ([place]) 06/26/2020  ? 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 Chronic kidney disease, stage 3b ([place]) 08/20/2019  ? Acute respiratory failure with hypoxia ([place]) 08/20/2019  ? Pneumonia due to COVID-19 virus 08/19/2019  ? UTI (urinary tract infection) 12/15/2018  ? Chronic diastolic CHF (congestive heart failure) ([place]) 12/15/2018  ? HLD (hyperlipidemia) 12/15/2018  ? Dehiscence of amputation stump (HCC)   ? Renal insufficiency 05/06/2018  ? Type 2 diabetes mellitus with vascular disease ([place])  05/06/2018  ? Debility 03/31/2018  ? Urinary retention with incomplete bladder emptying 03/31/2018  ? History of transmetatarsal amputation of left foot ([place])   ? 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 ([place])   ? Status post transmetatarsal amputation of left foot ([place])   ? Postoperative pain   ? Benign prostatic hyperplasia with urinary retention   ? New onset atrial fibrillation ([place])   ? Incontinence of feces   ? Cerebrovascular accident (CVA) ([place])   ? Benign essential HTN   ? Diabetes mellitus type 2 in nonobese [facility])   ? Hypokalemia   ? Paroxysmal atrial fibrillation (HCC)   ? Leukocytosis   ? SIRS (systemic inflammatory response syndrome) (HCC)   ? Essential hypertension 03/20/2018  ? Tachycardia 03/20/2018  ? Sepsis ([place]) 03/20/2018  ? Diabetes ([place]) 03/20/2018  ? Subacute osteomyelitis, left ankle and foot ([place]) 03/20/2018  ? A-fib ([place]) 03/20/2018  ? Stroke-like episode ([place]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 03/16/2018  ? Strain of left tibialis anterior muscle 12/19/2015  ? 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 Fracture of radius, distal, left, closed 12/19/2012  ? H/O ulcerative colitis 1993  ? ?Past Medical History:  ?Diagnosis Date  ? A-fib ([place]) 03/20/2018  ? Acute blood loss anemia   ? Benign essential HTN   ? Benign prostatic hyperplasia with urinary retention   ? Cerebrovascular accident (CVA) ([place])   ? CKD (chronic kidney disease), stage III ([place])   ? Diabetes ([place]) 03/20/2018  ? Diabetes mellitus type 2 in nonobese [facility])   ? Essential hypertension 03/20/2018  ? Former tobacco use   ? H/O ulcerative colitis 1993  ? Hypertension   ? Hypokalemia   ? Leukocytosis   ? New onset atrial fibrillation ([place])   ? Osteomyelitis ([place])   ? a. 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  ? PAF (paroxysmal atrial fibrillation) ([place])   ? a. dx 03/2018 in setting of stroke, osteomyelitis.  ? Sepsis ([place]) 03/20/2018  ? Stage 3 chronic kidney disease ([place])   ? pt/family unaware  ? Status post transmetatarsal amputation of  left foot ([place])   ? Stroke [facility]) 03/2018  ? Stroke-like episode ([place]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 03/16/2018  ? Tachycardia 03/20/2018  ? Wrist fracture   ? Left  ?  ?Family History  ?Problem Relation Age of Onset  ? Hypertension Father   ?  ?Past Surgical History:  ?Procedure Laterality Date  ? AMPUTATION Left 03/24/2018  ? Procedure: Transmetatarsal Amputation Left Foot;  Surgeon: [name], MD;  Location: [place];  Service: Orthopedics;  Laterality: Left;  ? CARDIOVERSION  N/A 05/05/2018  ? Procedure: CARDIOVERSION;  Surgeon: [name], MD;  Location: [facility] ENDOSCOPY;  Service: Cardiovascular;  Laterality: N/A;  ? COLONOSCOPY  12/2017  ? benign, remission from ulcerative colitis in 90s  ? I & D EXTREMITY Left 03/17/2018  ? Procedure: IRRIGATION AND DEBRIDEMENT FOOT;  Surgeon: [name], MD;  Location: [place];  Service: Orthopedics;  Laterality: Left;  ? STUMP REVISION Left 05/10/2018  ? Procedure: REVISION LEFT TRANSMETATARSAL AMPUTATION;  Surgeon: [name], MD;  Location: [place];  Service: Orthopedics;  Laterality: Left;  ? ?Social History  ? ?Occupational History  ? Not on file  ?Tobacco Use  ? Smoking status: Former  ?  Packs/day: 1.00  ?  Years: 20.00  ?  Pack years: 20.00  ?  Types: Cigarettes  ?  Start date: 1963  ?  Quit date: 27  ?  Years since quitting: 38.2  ? Smokeless tobacco: Never  ?Vaping Use  ? Vaping Use: Never used  ?Substance and Sexual Activity  ? Alcohol use: Yes  ?  Alcohol/week: 4.0 standard drinks  ?  Types: 4 Cans of beer per week  ?  Comment: social   ? Drug use: Never  ? Sexual activity: Not on file  ? ? ? ? ? ?

## 2022-01-04 DIAGNOSIS — C44319 Basal cell carcinoma of skin of other parts of face: Secondary | ICD-10-CM | POA: Diagnosis not present

## 2022-01-04 DIAGNOSIS — L720 Epidermal cyst: Secondary | ICD-10-CM | POA: Diagnosis not present

## 2022-01-04 DIAGNOSIS — C44219 Basal cell carcinoma of skin of left ear and external auricular canal: Secondary | ICD-10-CM | POA: Diagnosis not present

## 2022-01-06 DIAGNOSIS — Z20822 Contact with and (suspected) exposure to covid-19: Secondary | ICD-10-CM | POA: Diagnosis not present

## 2022-01-08 DIAGNOSIS — C4492 Squamous cell carcinoma of skin, unspecified: Secondary | ICD-10-CM | POA: Diagnosis not present

## 2022-01-08 DIAGNOSIS — Z481 Encounter for planned postprocedural wound closure: Secondary | ICD-10-CM | POA: Diagnosis not present

## 2022-01-08 DIAGNOSIS — C44321 Squamous cell carcinoma of skin of nose: Secondary | ICD-10-CM | POA: Diagnosis not present

## 2022-01-11 DIAGNOSIS — Z1152 Encounter for screening for COVID-19: Secondary | ICD-10-CM | POA: Diagnosis not present

## 2022-01-11 DIAGNOSIS — Z20828 Contact with and (suspected) exposure to other viral communicable diseases: Secondary | ICD-10-CM | POA: Diagnosis not present

## 2022-01-22 DIAGNOSIS — Z20822 Contact with and (suspected) exposure to covid-19: Secondary | ICD-10-CM | POA: Diagnosis not present

## 2022-01-27 DIAGNOSIS — Z20822 Contact with and (suspected) exposure to covid-19: Secondary | ICD-10-CM | POA: Diagnosis not present

## 2022-01-28 DIAGNOSIS — Z20822 Contact with and (suspected) exposure to covid-19: Secondary | ICD-10-CM | POA: Diagnosis not present

## 2022-02-02 DIAGNOSIS — Z20828 Contact with and (suspected) exposure to other viral communicable diseases: Secondary | ICD-10-CM | POA: Diagnosis not present

## 2022-02-03 DIAGNOSIS — Z20822 Contact with and (suspected) exposure to covid-19: Secondary | ICD-10-CM | POA: Diagnosis not present

## 2022-02-11 ENCOUNTER — Ambulatory Visit: Payer: Medicare Other | Admitting: Emergency Medicine

## 2022-03-05 DIAGNOSIS — C4441 Basal cell carcinoma of skin of scalp and neck: Secondary | ICD-10-CM | POA: Diagnosis not present

## 2022-03-24 ENCOUNTER — Encounter: Payer: Self-pay | Admitting: Emergency Medicine

## 2022-03-24 ENCOUNTER — Ambulatory Visit (INDEPENDENT_AMBULATORY_CARE_PROVIDER_SITE_OTHER): Payer: Medicare Other | Admitting: Emergency Medicine

## 2022-03-24 VITALS — BP 104/70 | HR 111

## 2022-03-24 DIAGNOSIS — R918 Other nonspecific abnormal finding of lung field: Secondary | ICD-10-CM

## 2022-03-24 DIAGNOSIS — R0609 Other forms of dyspnea: Secondary | ICD-10-CM | POA: Diagnosis not present

## 2022-03-24 DIAGNOSIS — J449 Chronic obstructive pulmonary disease, unspecified: Secondary | ICD-10-CM | POA: Diagnosis not present

## 2022-03-24 NOTE — Patient Instructions (Signed)
We will repeat your CT scan of the chest now to follow pulmonary nodules for any change. We will perform walking oximetry today. Continue your Stiolto 2 puffs once daily. Keep albuterol available to use 2 puffs up to every 4 hours if needed for shortness of breath, chest tightness, wheezing.  Follow Dr. Lamonte Sakai next available after CT scan to review together.

## 2022-03-24 NOTE — Assessment & Plan Note (Signed)
Slight enlargement on his January 2023 CT scan of the chest.  We did not have follow-up to discuss.  Talked about the options and differential diagnosis today.  We will repeat his super D CT now, determine whether navigational bronchoscopy is indicated.

## 2022-03-24 NOTE — Assessment & Plan Note (Signed)
Tolerating Stiolto, still has exertional dyspnea.  He is quite limited especially when he is trying to work outside in the yard.  Needs to be evaluated for occult hypoxemia.  We will check a walking oximetry today and consider ordering POC if he qualifies.

## 2022-03-24 NOTE — Progress Notes (Signed)
Subjective:    Patient ID: Kevin [name], male    DOB: Aug 25, 1941, 81 y.o.   MRN: [id number]  HPI  81 year old gentleman with history of tobacco use (20 pack years), hypertension, atrial fibrillation, CVA, CKD stage III, diabetes, ulcerative colitis.  He had COVID-19 in 07/2019 and was hospitalized.  Moderate obstruction on pulmonary function testing so he was started on Spiriva which was beneficial.  Part of his work-up included a high-res CT chest that showed emphysema, fibrotic change, in a UIP pattern, at his bases and a 2 cm GG left upper lobe nodule that abutted the left major fissure that was slightly increased in size.  PET scan 01/01/2021 reviewed by me shows right upper lobe 1.5 cm nodule with mild hypermetabolism SUV max 2.9 similar to his prior CT, a posterior left upper lobe 1.6 cm groundglass nodule with SUV 2.9   ROV 03/24/22 --follow-up visit for Kevin [name].  He is 81 with history tobacco use and COPD, interstitial lung disease noted on high-resolution CT scan of the chest and a UIP pattern.  Also hypertension, A-fib, CVA, CKD stage III, diabetes, UC.  He is managed on Stiolto.  We have been following right upper lobe pulmonary nodule, posterior left upper lobe pulmonary nodule.  His most recent CT scan of the chest was 10/07/2021 as below  CT chest 10/07/2021 reviewed by me shows a 1.6 cm spiculated solid peripheral right upper lobe nodule that is mildly increased in size compared with 03/2021.  The subsolid 2.3 cm posterior left upper lobe nodule has a 3 mm solid component and is slightly increased compared with 03/2021.   Review of Systems As per HPI  Past Medical History:  Diagnosis Date   A-fib ([place]) 03/20/2018   Acute blood loss anemia    Benign essential HTN    Benign prostatic hyperplasia with urinary retention    Cerebrovascular accident (CVA) ([place])    CKD (chronic kidney disease), stage III ([place])    Diabetes ([place]) 03/20/2018   Diabetes mellitus type 2 in nonobese  [facility]    Essential hypertension 03/20/2018   Former tobacco use    H/O ulcerative colitis 1993   Hypertension    Hypokalemia    Leukocytosis    New onset atrial fibrillation ([place])    Osteomyelitis ([place])    a. 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   PAF (paroxysmal atrial fibrillation) ([place])    a. dx 03/2018 in setting of stroke, osteomyelitis.   Sepsis ([place]) 03/20/2018   Stage 3 chronic kidney disease ([place])    pt/family unaware   Status post transmetatarsal amputation of left foot ([place])    Stroke ([place]) 03/2018   Stroke-like episode ([place]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 03/16/2018   Tachycardia 03/20/2018   Wrist fracture    Left     Family History  Problem Relation Age of Onset   Hypertension Father     No hx lung cancer.   Social History   Socioeconomic History   Marital status: Married    Spouse name: Not on file   Number of children: Not on file   Years of education: Not on file   Highest education level: Bachelor's degree (e.g., BA, AB, BS)  Occupational History   Not on file  Tobacco Use   Smoking status: Former    Packs/day: 1.00    Years: 20.00    Total pack years: 20.00    Types: Cigarettes    Start date: 1963    Quit date: 1985  Years since quitting: 38.5   Smokeless tobacco: Never  Vaping Use   Vaping Use: Never used  Substance and Sexual Activity   Alcohol use: Yes    Alcohol/week: 4.0 standard drinks of alcohol    Types: 4 Cans of beer per week    Comment: social    Drug use: Never   Sexual activity: Not on file  Other Topics Concern   Not on file  Social History Narrative   Lives at home with his wife and two cats. Married for almost 45 years   Right handed   Caffeine: 1 cup every morning   Social Determinants of Health   Financial Resource Strain: Not on file  Food Insecurity: Not on file  Transportation Needs: Not on file  Physical Activity: Not on file  Stress: Not on file  Social Connections: Not on file  Intimate Partner Violence: Not on file       No Known Allergies   Outpatient Medications Prior to Visit  Medication Sig Dispense Refill   albuterol (VENTOLIN HFA) 108 (90 Base) MCG/ACT inhaler Inhale 2 puffs into the lungs every 6 (six) hours as needed for wheezing or shortness of breath. 24 g 1   apixaban (ELIQUIS) 5 MG TABS tablet Take 1 tablet (5 mg total) by mouth 2 (two) times daily. 180 tablet 2   diltiazem (CARDIZEM CD) 120 MG 24 hr capsule Take 1 capsule (120 mg total) by mouth daily. 90 capsule 2   empagliflozin (JARDIANCE) 25 MG TABS tablet Take 25 mg by mouth daily.     FLUoxetine (PROZAC) 20 MG capsule Take 20 mg by mouth daily.     furosemide (LASIX) 20 MG tablet TAKE 1 TABLET DAILY. YOU MAY TAKE AN EXTRA 20 MG TABLET FOR INCREASED SWELLING OR WEIGHT GAIN 135 tablet 2   insulin glargine (LANTUS) 100 UNIT/ML injection Inject 38 Units into the skin 2 (two) times daily.     metoprolol succinate (TOPROL-XL) 50 MG 24 hr tablet Take 1 tablet (50 mg total) by mouth daily. Take with or immediately following a meal. 90 tablet 2   Multiple Vitamin (MULTIVITAMIN WITH MINERALS) TABS tablet Take 1 tablet by mouth daily.     rosuvastatin (CRESTOR) 10 MG tablet Take 0.5 tablets (5 mg total) by mouth daily. 45 tablet 2   tamsulosin (FLOMAX) 0.4 MG CAPS capsule Take 1 capsule (0.4 mg total) by mouth 2 times daily at 12 noon and 4 pm. 60 capsule 0   Tiotropium Bromide-Olodaterol (STIOLTO RESPIMAT) 2.5-2.5 MCG/ACT AERS Inhale 2 puffs into the lungs daily. 4 g 11   trimethoprim (TRIMPEX) 100 MG tablet Take 100 mg by mouth at bedtime.   11   doxycycline (VIBRA-TABS) 100 MG tablet Take 1 tablet (100 mg total) by mouth 2 (two) times daily. (Patient not taking: Reported on 03/24/2022) 20 tablet 0   mesalamine (LIALDA) 1.2 g EC tablet Take 4.8 g by mouth daily. (Patient not taking: Reported on 03/24/2022)     No facility-administered medications prior to visit.         Objective:   Physical Exam  Today's Vitals   03/24/22 1139  BP:  104/70  Pulse: (!) 111  SpO2: 91%   There is no height or weight on file to calculate BMI.;sm  Gen: Pleasant, well-nourished, in no distress,  normal affect  ENT: No lesions,  mouth clear,  oropharynx clear, no postnasal drip  Neck: No JVD, no stridor  Lungs: No use of accessory muscles, no crackles  or wheezing on normal respiration, no wheeze on forced expiration  Cardiovascular: RRR, heart sounds normal, no murmur or gallops, no peripheral edema  Musculoskeletal: No deformities, no cyanosis or clubbing  Neuro: alert, awake, non focal  Skin: Warm, no lesions or rash    Assessment & Plan:  COPD (chronic obstructive pulmonary disease) (HCC) Tolerating Stiolto, still has exertional dyspnea.  He is quite limited especially when he is trying to work outside in the yard.  Needs to be evaluated for occult hypoxemia.  We will check a walking oximetry today and consider ordering POC if he qualifies.  Pulmonary nodules/lesions, multiple Slight enlargement on his January 2023 CT scan of the chest.  We did not have follow-up to discuss.  Talked about the options and differential diagnosis today.  We will repeat his super D CT now, determine whether navigational bronchoscopy is indicated.   Baltazar Apo, MD, PhD 03/24/2022, 12:08 PM [facility] [phone] or if no answer before 7:00PM call [phone] For any issues after 7:00PM please call eLink [phone]

## 2022-04-01 ENCOUNTER — Ambulatory Visit: Payer: Medicare Other | Admitting: Orthopedic Surgery

## 2022-04-06 DIAGNOSIS — F419 Anxiety disorder, unspecified: Secondary | ICD-10-CM | POA: Diagnosis not present

## 2022-04-06 DIAGNOSIS — I4891 Unspecified atrial fibrillation: Secondary | ICD-10-CM | POA: Diagnosis not present

## 2022-04-06 DIAGNOSIS — I700 Atherosclerosis of aorta: Secondary | ICD-10-CM | POA: Diagnosis not present

## 2022-04-06 DIAGNOSIS — J449 Chronic obstructive pulmonary disease, unspecified: Secondary | ICD-10-CM | POA: Diagnosis not present

## 2022-04-06 DIAGNOSIS — E7800 Pure hypercholesterolemia, unspecified: Secondary | ICD-10-CM | POA: Diagnosis not present

## 2022-04-06 DIAGNOSIS — E1165 Type 2 diabetes mellitus with hyperglycemia: Secondary | ICD-10-CM | POA: Diagnosis not present

## 2022-04-06 DIAGNOSIS — E1169 Type 2 diabetes mellitus with other specified complication: Secondary | ICD-10-CM | POA: Diagnosis not present

## 2022-04-06 DIAGNOSIS — K5190 Ulcerative colitis, unspecified, without complications: Secondary | ICD-10-CM | POA: Diagnosis not present

## 2022-04-06 DIAGNOSIS — F3341 Major depressive disorder, recurrent, in partial remission: Secondary | ICD-10-CM | POA: Diagnosis not present

## 2022-04-06 DIAGNOSIS — D6869 Other thrombophilia: Secondary | ICD-10-CM | POA: Diagnosis not present

## 2022-04-06 DIAGNOSIS — D692 Other nonthrombocytopenic purpura: Secondary | ICD-10-CM | POA: Diagnosis not present

## 2022-04-06 DIAGNOSIS — I10 Essential (primary) hypertension: Secondary | ICD-10-CM | POA: Diagnosis not present

## 2022-04-08 ENCOUNTER — Ambulatory Visit: Payer: Medicare Other | Admitting: Orthopedic Surgery

## 2022-04-12 ENCOUNTER — Ambulatory Visit (INDEPENDENT_AMBULATORY_CARE_PROVIDER_SITE_OTHER): Payer: Medicare Other | Admitting: Orthopedic Surgery

## 2022-04-12 DIAGNOSIS — Z89432 Acquired absence of left foot: Secondary | ICD-10-CM

## 2022-04-12 DIAGNOSIS — L97521 Non-pressure chronic ulcer of other part of left foot limited to breakdown of skin: Secondary | ICD-10-CM

## 2022-04-12 DIAGNOSIS — I872 Venous insufficiency (chronic) (peripheral): Secondary | ICD-10-CM

## 2022-04-26 ENCOUNTER — Encounter: Payer: Self-pay | Admitting: Orthopedic Surgery

## 2022-04-26 NOTE — Progress Notes (Signed)
Office Visit Note   Patient: Kevin [name]           Date of Birth: 1941-03-08           MRN: [id number] Visit Date: 04/12/2022              Requested by: [name], MD [address] [place],   [id number] PCP: [name], MD  Chief Complaint  Patient presents with   Right Leg - Follow-up   Left Leg - Follow-up      HPI: Patient is a 81 year old gentleman who is seen status post left transmetatarsal amputation with persistent ulceration left foot.  Patient does have prescription for double upright braces currently being fabricated at [facility].  Assessment & Plan: Visit Diagnoses:  1. Venous stasis dermatitis of both lower extremities   2. Non-pressure chronic ulcer of other part of left foot limited to breakdown of skin ([place])   3. History of transmetatarsal amputation of left foot ([place])     Plan: Ulcer debrided.  Patient will follow-up with [facility] for double upright bracing.  Follow-Up Instructions: Return in about 2 months (around 06/13/2022).   Ortho Exam  Patient is alert, oriented, no adenopathy, well-dressed, normal affect, normal respiratory effort. Examination patient's foot is plantigrade there is no redness no cellulitis no signs of infection.  Patient has a Wagner grade 1 ulcer of the left transmetatarsal amputation.  After informed consent a 10 blade knife was used to debride the skin and soft tissue back to healthy viable tissue.  After debridement the ulcer was 10 mm in diameter 1 mm deep there is no exposed bone or tendon there is no tunneling.  Imaging: No results found. No images are attached to the encounter.  Labs: Lab Results  Component Value Date   HGBA1C 8.7 (H) 08/20/2019   HGBA1C 10.6 (H) 12/17/2018   HGBA1C 7.6 (H) 03/17/2018   CRP 0.8 08/24/2019   CRP <0.8 08/23/2019   CRP 2.3 (H) 08/22/2019   REPTSTATUS 08/23/2019 FINAL 08/20/2019   GRAMSTAIN  03/17/2018    MODERATE WBC PRESENT, PREDOMINANTLY  PMN MODERATE GRAM POSITIVE COCCI FEW GRAM NEGATIVE RODS    CULT >=100,000 COLONIES/mL ENTEROCOCCUS FAECALIS (A) 08/20/2019   LABORGA ENTEROCOCCUS FAECALIS (A) 08/20/2019     Lab Results  Component Value Date   ALBUMIN 3.3 (L) 08/24/2019   ALBUMIN 3.2 (L) 08/23/2019   ALBUMIN 3.4 (L) 08/22/2019    Lab Results  Component Value Date   MG 2.1 08/24/2019   MG 2.1 08/23/2019   MG 2.1 08/22/2019   No results found for: "VD25OH"  No results found for: "PREALBUMIN"    Latest Ref Rng & Units 04/09/2021   12:00 AM 08/24/2019    1:15 AM 08/23/2019    1:10 AM  CBC EXTENDED  WBC 3.4 - 10.8 x10E3/uL 10.3  11.9  10.7   RBC 4.14 - 5.80 x10E6/uL 4.74  4.50  4.82   Hemoglobin 13.0 - 17.7 g/dL 14.6  13.6  14.5   HCT 37.5 - 51.0 % 43.7  40.2  43.5   Platelets 150 - 450 x10E3/uL 261  275  235   NEUT# 1.7 - 7.7 K/uL  8.7  8.5   Lymph# 0.7 - 4.0 K/uL  2.0  1.5      There is no height or weight on file to calculate BMI.  Orders:  No orders of the defined types were placed in this encounter.  No orders of the defined types  were placed in this encounter.    Procedures: No procedures performed  Clinical Data: No additional findings.  ROS:  All other systems negative, except as noted in the HPI. Review of Systems  Objective: Vital Signs: There were no vitals taken for this visit.  Specialty Comments:  No specialty comments available.  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   Chronic kidney disease, stage 3b ([place]) 08/20/2019   Acute respiratory failure with hypoxia ([place]) 08/20/2019   Pneumonia due to COVID-19 virus 08/19/2019   UTI (urinary tract infection) 12/15/2018   Chronic diastolic CHF (congestive heart failure) ([place]) 12/15/2018   HLD (hyperlipidemia) 12/15/2018    Dehiscence of amputation stump ([place])    Renal insufficiency 05/06/2018   Type 2 diabetes mellitus with vascular disease ([place]) 05/06/2018   Debility 03/31/2018   Urinary retention with incomplete bladder emptying 03/31/2018   History of transmetatarsal amputation of left foot (HCC)    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 (HCC)    Status post transmetatarsal amputation of left foot (HCC)    Postoperative pain    Benign prostatic hyperplasia with urinary retention    New onset atrial fibrillation (HCC)    Incontinence of feces    Cerebrovascular accident (CVA) ([place])    Benign essential HTN    Diabetes mellitus type 2 in nonobese ([place])    Hypokalemia    Paroxysmal atrial fibrillation (HCC)    Leukocytosis    SIRS (systemic inflammatory response syndrome) ([place])    Essential hypertension 03/20/2018   Tachycardia 03/20/2018   Sepsis ([place]) 03/20/2018   Diabetes ([place]) 03/20/2018   Subacute osteomyelitis, left ankle and foot ([place]) 03/20/2018   A-fib ([place]) 03/20/2018   Stroke-like episode ([place]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 03/16/2018   Strain of left tibialis anterior muscle 12/19/2015   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   Fracture of radius, distal, left, closed 12/19/2012   H/O ulcerative colitis 1993   Past Medical History:  Diagnosis Date   A-fib ([place]) 03/20/2018   Acute blood loss anemia    Benign essential HTN    Benign prostatic hyperplasia with urinary retention    Cerebrovascular accident (CVA) ([place])    CKD (chronic kidney disease), stage III ([place])    Diabetes ([place]) 03/20/2018   Diabetes mellitus type 2 in nonobese [facility])    Essential hypertension 03/20/2018   Former tobacco use    H/O ulcerative colitis 1993   Hypertension    Hypokalemia    Leukocytosis    New onset atrial fibrillation ([place])    Osteomyelitis ([place])    a. 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   PAF (paroxysmal atrial fibrillation) ([place])    a. dx 03/2018 in setting of stroke, osteomyelitis.    Sepsis ([place]) 03/20/2018   Stage 3 chronic kidney disease ([place])    pt/family unaware   Status post transmetatarsal amputation of left foot ([place])    Stroke ([place]) 03/2018   Stroke-like episode ([place]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 03/16/2018   Tachycardia 03/20/2018   Wrist fracture    Left    Family History  Problem Relation Age of Onset   Hypertension Father     Past Surgical History:  Procedure Laterality Date   AMPUTATION Left 03/24/2018   Procedure: Transmetatarsal Amputation Left Foot;  Surgeon: [name], MD;  Location: [facility];  Service: Orthopedics;  Laterality: Left;  CARDIOVERSION N/A 05/05/2018   Procedure: CARDIOVERSION;  Surgeon: [name], MD;  Location: [facility] ENDOSCOPY;  Service: Cardiovascular;  Laterality: N/A;   COLONOSCOPY  12/2017   benign, remission from ulcerative colitis in 90s   I & D EXTREMITY Left 03/17/2018   Procedure: [place];  Surgeon: [name], MD;  Location: [facility];  Service: Orthopedics;  Laterality: Left;   STUMP REVISION Left 05/10/2018   Procedure: REVISION LEFT TRANSMETATARSAL AMPUTATION;  Surgeon: [name], MD;  Location: [facility];  Service: Orthopedics;  Laterality: Left;   Social History   Occupational History   Not on file  Tobacco Use   Smoking status: Former    Packs/day: 1.00    Years: 20.00    Total pack years: 20.00    Types: Cigarettes    Start date: 1963    Quit date: 1985    Years since quitting: 38.6   Smokeless tobacco: Never  Vaping Use   Vaping Use: Never used  Substance and Sexual Activity   Alcohol use: Yes    Alcohol/week: 4.0 standard drinks of alcohol    Types: 4 Cans of beer per week    Comment: social    Drug use: Never   Sexual activity: Not on file

## 2022-04-28 ENCOUNTER — Ambulatory Visit
Admission: RE | Admit: 2022-04-28 | Discharge: 2022-04-28 | Disposition: A | Discharge status: Home / Self Care | Payer: Medicare Other | Source: Ambulatory Visit | Attending: Emergency Medicine | Admitting: Emergency Medicine

## 2022-04-28 DIAGNOSIS — R918 Other nonspecific abnormal finding of lung field: Secondary | ICD-10-CM | POA: Diagnosis not present

## 2022-04-28 DIAGNOSIS — R911 Solitary pulmonary nodule: Secondary | ICD-10-CM | POA: Diagnosis not present

## 2022-04-28 DIAGNOSIS — J439 Emphysema, unspecified: Secondary | ICD-10-CM | POA: Diagnosis not present

## 2022-04-28 DIAGNOSIS — R0609 Other forms of dyspnea: Secondary | ICD-10-CM

## 2022-04-28 DIAGNOSIS — I700 Atherosclerosis of aorta: Secondary | ICD-10-CM | POA: Diagnosis not present

## 2022-04-30 ENCOUNTER — Telehealth: Payer: Self-pay | Admitting: Emergency Medicine

## 2022-04-30 NOTE — Telephone Encounter (Signed)
Called patient and he is wanting to go over the results of CT scan that he had done earlier this week. HE states that the results were released on mychart and he has questions. I advised patient that I would message the doctor to get those results.  Dr Lamonte Sakai please advise when you have a chance

## 2022-05-03 NOTE — Telephone Encounter (Signed)
Attempted to call pt but unable to reach. Left message for him to return call. °

## 2022-05-03 NOTE — Telephone Encounter (Signed)
Please let him know that I have reviewed his CT chest and that his pulmonary nodules are still present, may have enlarged slightly compared with his most recent scan. I would like to see him in office to discuss the scan and to decide on any other testing that should be done. Please set him up as ROV or in a nodule slot or blocked slot.

## 2022-05-04 NOTE — Telephone Encounter (Signed)
Pt called & left a vm on my phone - states he is unable to get thru and wants to go over CT results with someone.  He states to please call him back at [phone].

## 2022-05-05 NOTE — Telephone Encounter (Signed)
Called and spoke with pt letting him know the results of CT and that RB wanted [place] to schedule a f/u to further discuss. Pt verbalized understanding. Appt has been scheduled for pt with RB. Nothing further needed.

## 2022-05-05 NOTE — Telephone Encounter (Signed)
Pt has called again and left me another vm requesting results of his CT.

## 2022-05-12 DIAGNOSIS — E1165 Type 2 diabetes mellitus with hyperglycemia: Secondary | ICD-10-CM | POA: Diagnosis not present

## 2022-05-12 DIAGNOSIS — I509 Heart failure, unspecified: Secondary | ICD-10-CM | POA: Diagnosis not present

## 2022-05-12 DIAGNOSIS — L03116 Cellulitis of left lower limb: Secondary | ICD-10-CM | POA: Diagnosis not present

## 2022-05-12 DIAGNOSIS — E7800 Pure hypercholesterolemia, unspecified: Secondary | ICD-10-CM | POA: Diagnosis not present

## 2022-05-12 DIAGNOSIS — I10 Essential (primary) hypertension: Secondary | ICD-10-CM | POA: Diagnosis not present

## 2022-05-12 DIAGNOSIS — Z89432 Acquired absence of left foot: Secondary | ICD-10-CM | POA: Diagnosis not present

## 2022-05-12 DIAGNOSIS — I4891 Unspecified atrial fibrillation: Secondary | ICD-10-CM | POA: Diagnosis not present

## 2022-05-12 DIAGNOSIS — I700 Atherosclerosis of aorta: Secondary | ICD-10-CM | POA: Diagnosis not present

## 2022-05-18 ENCOUNTER — Encounter: Payer: Self-pay | Admitting: Emergency Medicine

## 2022-05-18 ENCOUNTER — Ambulatory Visit (INDEPENDENT_AMBULATORY_CARE_PROVIDER_SITE_OTHER): Payer: Medicare Other | Admitting: Emergency Medicine

## 2022-05-18 DIAGNOSIS — R918 Other nonspecific abnormal finding of lung field: Secondary | ICD-10-CM

## 2022-05-18 DIAGNOSIS — R053 Chronic cough: Secondary | ICD-10-CM

## 2022-05-18 DIAGNOSIS — E119 Type 2 diabetes mellitus without complications: Secondary | ICD-10-CM | POA: Diagnosis not present

## 2022-05-18 DIAGNOSIS — J9611 Chronic respiratory failure with hypoxia: Secondary | ICD-10-CM | POA: Diagnosis not present

## 2022-05-18 DIAGNOSIS — Z888 Allergy status to other drugs, medicaments and biological substances status: Secondary | ICD-10-CM | POA: Diagnosis not present

## 2022-05-18 DIAGNOSIS — I4891 Unspecified atrial fibrillation: Secondary | ICD-10-CM | POA: Diagnosis not present

## 2022-05-18 DIAGNOSIS — J449 Chronic obstructive pulmonary disease, unspecified: Secondary | ICD-10-CM | POA: Diagnosis not present

## 2022-05-18 DIAGNOSIS — Z79899 Other long term (current) drug therapy: Secondary | ICD-10-CM | POA: Diagnosis not present

## 2022-05-18 DIAGNOSIS — I110 Hypertensive heart disease with heart failure: Secondary | ICD-10-CM | POA: Diagnosis not present

## 2022-05-18 DIAGNOSIS — S63251A Unspecified dislocation of left index finger, initial encounter: Secondary | ICD-10-CM | POA: Diagnosis not present

## 2022-05-18 DIAGNOSIS — Z8673 Personal history of transient ischemic attack (TIA), and cerebral infarction without residual deficits: Secondary | ICD-10-CM | POA: Diagnosis not present

## 2022-05-18 DIAGNOSIS — R059 Cough, unspecified: Secondary | ICD-10-CM | POA: Insufficient documentation

## 2022-05-18 DIAGNOSIS — Z85828 Personal history of other malignant neoplasm of skin: Secondary | ICD-10-CM | POA: Diagnosis not present

## 2022-05-18 DIAGNOSIS — Z9181 History of falling: Secondary | ICD-10-CM | POA: Diagnosis not present

## 2022-05-18 DIAGNOSIS — Z882 Allergy status to sulfonamides status: Secondary | ICD-10-CM | POA: Diagnosis not present

## 2022-05-18 DIAGNOSIS — Z7982 Long term (current) use of aspirin: Secondary | ICD-10-CM | POA: Diagnosis not present

## 2022-05-18 DIAGNOSIS — Z7901 Long term (current) use of anticoagulants: Secondary | ICD-10-CM | POA: Diagnosis not present

## 2022-05-18 DIAGNOSIS — R2232 Localized swelling, mass and lump, left upper limb: Secondary | ICD-10-CM | POA: Diagnosis not present

## 2022-05-18 DIAGNOSIS — I509 Heart failure, unspecified: Secondary | ICD-10-CM | POA: Diagnosis not present

## 2022-05-18 DIAGNOSIS — Z794 Long term (current) use of insulin: Secondary | ICD-10-CM | POA: Diagnosis not present

## 2022-05-18 DIAGNOSIS — S63253A Unspecified dislocation of left middle finger, initial encounter: Secondary | ICD-10-CM | POA: Diagnosis not present

## 2022-05-18 DIAGNOSIS — S63283A Dislocation of proximal interphalangeal joint of left middle finger, initial encounter: Secondary | ICD-10-CM | POA: Diagnosis not present

## 2022-05-18 NOTE — Assessment & Plan Note (Signed)
With hypoxia.  Continue oxygen at 3 L/min with exertion.

## 2022-05-18 NOTE — Assessment & Plan Note (Signed)
Continue Stiolto 2 puffs once daily. Keep your albuterol available to use 2 puffs if needed for shortness of breath, chest tightness, wheezing.

## 2022-05-18 NOTE — Assessment & Plan Note (Signed)
Appears to be upper airway in nature and does correlate with chronic rhinitis.  He is not on any therapy for this.  We will try loratadine to see if he gets benefit.  Discussed nasal steroid with him as well.  We may consider this in the future.

## 2022-05-18 NOTE — Progress Notes (Signed)
Subjective:    Patient ID: Kevin [name], male    DOB: October 14, 1940, 81 y.o.   MRN: [id number]  HPI  ROV 05/18/2022 --81 year old man with history of tobacco use, COPD and interstitial lung disease and a UIP pattern.  PMH also significant for hypertension, A-fib, CVA, CKD stage III, diabetes and ulcerative colitis.  He has a right upper lobe pulmonary nodule and a posterior left upper lobe pulmonary nodule that have been followed on serial imaging.  PET scan done in 01/01/2021 showed some mild hypermetabolism (SUV 2.9).  Most recent scan done on 04/28/2022 as below.  He has O2 > uses 3L/min with heavier exertion. He is on [employer]. Occasional albuterol use, not every day, does benefit. Some wheeze at night. He has nasal gtt and obstruction, has associated cough. Not on an allergy regimen.   CT scan of the chest 04/28/2022 reviewed by me shows significant centrilobular emphysema, bilateral bronchial wall thickening.  There is a stable to slightly enlarged 1.8 x 1.2 cm spiculated right upper lobe nodule.  His 2.2 x 0.8 cm elongated left upper lobe nodule is unchanged   Review of Systems As per HPI  Past Medical History:  Diagnosis Date   A-fib ([place]) 03/20/2018   Acute blood loss anemia    Benign essential HTN    Benign prostatic hyperplasia with urinary retention    Cerebrovascular accident (CVA) ([place])    CKD (chronic kidney disease), stage III ([place])    Diabetes ([place]) 03/20/2018   Diabetes mellitus type 2 in nonobese [facility])    Essential hypertension 03/20/2018   Former tobacco use    H/O ulcerative colitis 1993   Hypertension    Hypokalemia    Leukocytosis    New onset atrial fibrillation ([place])    Osteomyelitis ([place])    a. 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   PAF (paroxysmal atrial fibrillation) ([place])    a. dx 03/2018 in setting of stroke, osteomyelitis.   Sepsis ([place]) 03/20/2018   Stage 3 chronic kidney disease ([place])    pt/family unaware   Status post transmetatarsal amputation of  left foot ([place])    Stroke ([place]) 03/2018   Stroke-like episode ([place]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 03/16/2018   Tachycardia 03/20/2018   Wrist fracture    Left     Family History  Problem Relation Age of Onset   Hypertension Father     No hx lung cancer.   Social History   Socioeconomic History   Marital status: Married    Spouse name: Not on file   Number of children: Not on file   Years of education: Not on file   Highest education level: Bachelor's degree (e.g., BA, AB, BS)  Occupational History   Not on file  Tobacco Use   Smoking status: Former    Packs/day: 1.00    Years: 20.00    Total pack years: 20.00    Types: Cigarettes    Start date: 1963    Quit date: 1985    Years since quitting: 38.6   Smokeless tobacco: Never  Vaping Use   Vaping Use: Never used  Substance and Sexual Activity   Alcohol use: Yes    Alcohol/week: 4.0 standard drinks of alcohol    Types: 4 Cans of beer per week    Comment: social    Drug use: Never   Sexual activity: Not on file  Other Topics Concern   Not on file  Social History Narrative   Lives at home with his wife  and two cats. Married for almost 45 years   Right handed   Caffeine: 1 cup every morning   Social Determinants of Health   Financial Resource Strain: Not on file  Food Insecurity: Not on file  Transportation Needs: Not on file  Physical Activity: Not on file  Stress: Not on file  Social Connections: Not on file  Intimate Partner Violence: Not on file      No Known Allergies   Outpatient Medications Prior to Visit  Medication Sig Dispense Refill   albuterol (VENTOLIN HFA) 108 (90 Base) MCG/ACT inhaler Inhale 2 puffs into the lungs every 6 (six) hours as needed for wheezing or shortness of breath. 24 g 1   apixaban (ELIQUIS) 5 MG TABS tablet Take 1 tablet (5 mg total) by mouth 2 (two) times daily. 180 tablet 2   diltiazem (CARDIZEM CD) 120 MG 24 hr capsule Take 1 capsule (120 mg total) by mouth daily. 90 capsule 2   doxycycline  (VIBRA-TABS) 100 MG tablet Take 1 tablet (100 mg total) by mouth 2 (two) times daily. 20 tablet 0   empagliflozin (JARDIANCE) 25 MG TABS tablet Take 25 mg by mouth daily.     escitalopram (LEXAPRO) 10 MG tablet 1 tablet     furosemide (LASIX) 20 MG tablet TAKE 1 TABLET DAILY. YOU MAY TAKE AN EXTRA 20 MG TABLET FOR INCREASED SWELLING OR WEIGHT GAIN 135 tablet 2   insulin glargine (LANTUS) 100 UNIT/ML injection Inject 38 Units into the skin 2 (two) times daily.     mesalamine (LIALDA) 1.2 g EC tablet Take 4.8 g by mouth daily.     metoprolol succinate (TOPROL-XL) 50 MG 24 hr tablet Take 1 tablet (50 mg total) by mouth daily. Take with or immediately following a meal. 90 tablet 2   Multiple Vitamin (MULTIVITAMIN WITH MINERALS) TABS tablet Take 1 tablet by mouth daily.     rosuvastatin (CRESTOR) 10 MG tablet Take 0.5 tablets (5 mg total) by mouth daily. 45 tablet 2   tamsulosin (FLOMAX) 0.4 MG CAPS capsule Take 1 capsule (0.4 mg total) by mouth 2 times daily at 12 noon and 4 pm. 60 capsule 0   Tiotropium Bromide-Olodaterol (STIOLTO RESPIMAT) 2.5-2.5 MCG/ACT AERS Inhale 2 puffs into the lungs daily. 4 g 11   trimethoprim (TRIMPEX) 100 MG tablet Take 100 mg by mouth at bedtime.   11   FLUoxetine (PROZAC) 20 MG capsule Take 20 mg by mouth daily. (Patient not taking: Reported on 05/18/2022)     No facility-administered medications prior to visit.         Objective:   Physical Exam  Today's Vitals   05/18/22 1512  BP: 138/68  Pulse: 77  Temp: 98 F (36.7 C)  TempSrc: Oral  SpO2: 94%  Weight: 229 lb 6.4 oz (104.1 kg)  Height: 6' 2"  (1.88 m)   Body mass index is 29.45 kg/m.;sm  Gen: Pleasant, well-nourished, in no distress,  normal affect  ENT: No lesions,  mouth clear,  oropharynx clear, no postnasal drip  Neck: No JVD, no stridor  Lungs: No use of accessory muscles, no crackles or wheezing on normal respiration, no wheeze on forced expiration  Cardiovascular: RRR, heart sounds  normal, no murmur or gallops, no peripheral edema  Musculoskeletal: No deformities, no cyanosis or clubbing  Neuro: alert, awake, non focal  Skin: Warm, no lesions or rash    Assessment & Plan:  COPD (chronic obstructive pulmonary disease) (HCC) Continue Stiolto 2 puffs once daily. Keep  your albuterol available to use 2 puffs if needed for shortness of breath, chest tightness, wheezing.  Pulmonary nodules/lesions, multiple Most recent CT scan is equivocal.  Unclear whether there is been significant enlargement in the right upper lobe nodule.  The left upper lobe nodule appears to be stable.  PET negative on prior scan.  I think we can repeat in 6 months.  If there is an interval enlargement he would be willing to undergo navigational bronchoscopy.  Chronic respiratory failure (HCC) With hypoxia.  Continue oxygen at 3 L/min with exertion.  Cough Appears to be upper airway in nature and does correlate with chronic rhinitis.  He is not on any therapy for this.  We will try loratadine to see if he gets benefit.  Discussed nasal steroid with him as well.  We may consider this in the future.   Baltazar Apo, MD, PhD 05/18/2022, 3:32 PM [facility] [phone] or if no answer before 7:00PM call [phone] For any issues after 7:00PM please call eLink [phone]

## 2022-05-18 NOTE — Addendum Note (Signed)
Addended by: Dierdre Highman on: 05/18/2022 03:41 PM   Modules accepted: Orders

## 2022-05-18 NOTE — Assessment & Plan Note (Signed)
Most recent CT scan is equivocal.  Unclear whether there is been significant enlargement in the right upper lobe nodule.  The left upper lobe nodule appears to be stable.  PET negative on prior scan.  I think we can repeat in 6 months.  If there is an interval enlargement he would be willing to undergo navigational bronchoscopy.

## 2022-05-18 NOTE — Patient Instructions (Addendum)
We will plan to repeat your CT scan of the chest in February 2024 to compare with priors. Continue Stiolto 2 puffs once daily. Keep your albuterol available to use 2 puffs if needed for shortness of breath, chest tightness, wheezing. Please try starting loratadine 10 mg (generic Claritin) once daily.  You can get this over-the-counter Continue your oxygen at 3 L/min with exertion. Follow with Dr. Lamonte Sakai in February after your CT scan so we can review the results together.

## 2022-05-26 DIAGNOSIS — M79642 Pain in left hand: Secondary | ICD-10-CM | POA: Diagnosis not present

## 2022-06-09 DIAGNOSIS — Z89432 Acquired absence of left foot: Secondary | ICD-10-CM

## 2022-06-09 DIAGNOSIS — M79642 Pain in left hand: Secondary | ICD-10-CM | POA: Diagnosis not present

## 2022-06-15 ENCOUNTER — Ambulatory Visit (INDEPENDENT_AMBULATORY_CARE_PROVIDER_SITE_OTHER): Payer: Medicare Other | Admitting: Orthopedic Surgery

## 2022-06-15 ENCOUNTER — Encounter: Payer: Self-pay | Admitting: Orthopedic Surgery

## 2022-06-15 DIAGNOSIS — I872 Venous insufficiency (chronic) (peripheral): Secondary | ICD-10-CM

## 2022-06-15 DIAGNOSIS — Z89432 Acquired absence of left foot: Secondary | ICD-10-CM

## 2022-06-15 DIAGNOSIS — L97521 Non-pressure chronic ulcer of other part of left foot limited to breakdown of skin: Secondary | ICD-10-CM

## 2022-06-15 NOTE — Progress Notes (Signed)
Office Visit Note   Patient: Kevin [name]           Date of Birth: [birth year]           MRN: [id number] Visit Date: 06/15/2022              Requested by: [name], MD [address] [place],  [place] [address] PCP: [name], MD  Chief Complaint  Patient presents with   Left Foot - Wound Check      HPI: Patient is a 81 year old gentleman who presents for evaluation for venous insufficiency a left transmetatarsal amputation and a Wagner grade 1 ulcer on the plantar aspect of the left foot.  Patient is also on Eliquis.  Assessment & Plan: Visit Diagnoses:  1. Venous stasis dermatitis of both lower extremities   2. History of transmetatarsal amputation of left foot (Kevin [name])   3. Non-pressure chronic ulcer of other part of left foot limited to breakdown of skin (Kevin [name])     Plan: Despite the patient's custom orthotics and spacer he is still having trouble with shoe wear and coming out of the shoe and has had episodes of falling.  Patient was provided a prescription for double upright brace on the left and extra-depth shoe.  Follow-Up Instructions: Return in about 4 weeks (around 07/13/2022).   Ortho Exam  Patient is alert, oriented, no adenopathy, well-dressed, normal affect, normal respiratory effort. Examination patient has venous stasis swelling in both legs but no open ulcers.  He has a good custom orthotic with spacer for the left foot.  He has a Wagner grade 1 ulcer on the plantar aspect of the left foot.  After informed consent a 10 blade knife was used to debride the skin and soft tissue back to healthy viable tissue.  This left a wound that was 2 cm in diameter 2 mm deep.  There is no tunneling no exposed bone or tendon.  Imaging: No results found. No images are attached to the encounter.  Labs: Lab Results  Component Value Date   HGBA1C 8.7 (H) 08/20/2019   HGBA1C 10.6 (H) 12/17/2018   HGBA1C 7.6 (H) 03/17/2018   CRP 0.8 08/24/2019    CRP <0.8 08/23/2019   CRP 2.3 (H) 08/22/2019   REPTSTATUS 08/23/2019 FINAL 08/20/2019   GRAMSTAIN  03/17/2018    MODERATE WBC PRESENT, PREDOMINANTLY PMN MODERATE GRAM POSITIVE COCCI FEW GRAM NEGATIVE RODS    CULT >=100,000 COLONIES/mL ENTEROCOCCUS FAECALIS (A) 08/20/2019   LABORGA ENTEROCOCCUS FAECALIS (A) 08/20/2019     Lab Results  Component Value Date   ALBUMIN 3.3 (L) 08/24/2019   ALBUMIN 3.2 (L) 08/23/2019   ALBUMIN 3.4 (L) 08/22/2019    Lab Results  Component Value Date   MG 2.1 08/24/2019   MG 2.1 08/23/2019   MG 2.1 08/22/2019   No results found for: "VD25OH"  No results found for: "PREALBUMIN"    Latest Ref Rng & Units 04/09/2021   12:00 AM 08/24/2019    1:15 AM 08/23/2019    1:10 AM  CBC EXTENDED  WBC 3.4 - 10.8 x10E3/uL 10.3  11.9  10.7   RBC 4.14 - 5.80 x10E6/uL 4.74  4.50  4.82   Hemoglobin 13.0 - 17.7 g/dL 14.6  13.6  14.5   HCT 37.5 - 51.0 % 43.7  40.2  43.5   Platelets 150 - 450 x10E3/uL 261  275  235   NEUT# 1.7 - 7.7 K/uL  8.7  8.5   Lymph# 0.7 -  4.0 K/uL  2.0  1.5      There is no height or weight on file to calculate BMI.  Orders:  No orders of the defined types were placed in this encounter.  No orders of the defined types were placed in this encounter.    Procedures: No procedures performed  Clinical Data: No additional findings.  ROS:  All other systems negative, except as noted in the HPI. Review of Systems  Objective: Vital Signs: There were no vitals taken for this visit.  Specialty Comments:  No specialty comments available.  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   Chronic kidney disease, stage 3b ([name]) 08/20/2019   Chronic respiratory failure ([place]) 08/20/2019   Pneumonia due to COVID-19 virus  08/19/2019   UTI (urinary tract infection) 12/15/2018   Chronic diastolic CHF (congestive heart failure) ([place]) 12/15/2018   HLD (hyperlipidemia) 12/15/2018   Dehiscence of amputation stump ([place])    Renal insufficiency 05/06/2018   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   Debility 03/31/2018   Urinary retention with incomplete bladder emptying 03/31/2018   History of transmetatarsal amputation of left foot (HCC)    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 (HCC)    Status post transmetatarsal amputation of left foot (HCC)    Postoperative pain    Benign prostatic hyperplasia with urinary retention    New onset atrial fibrillation (HCC)    Incontinence of feces    Cerebrovascular accident (CVA) ([place])    Benign essential HTN    Diabetes mellitus type 2 in nonobese ([place])    Hypokalemia    Paroxysmal atrial fibrillation (HCC)    Leukocytosis    SIRS (systemic inflammatory response syndrome) ([place])    Essential hypertension 03/20/2018   Tachycardia 03/20/2018   Sepsis ([place]) 03/20/2018   Diabetes ([place]) 03/20/2018   Subacute osteomyelitis, left ankle and foot ([place]) 03/20/2018   A-fib ([place]) 03/20/2018   Stroke-like episode ([place]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 03/16/2018   Strain of left tibialis anterior muscle 12/19/2015   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)    Diabetes ([place]) 03/20/2018   Diabetes mellitus type 2 in nonobese [facility])    Essential hypertension 03/20/2018   Former tobacco use    H/O ulcerative colitis 1993   Hypertension    Hypokalemia    Leukocytosis    New onset atrial fibrillation ([place])     Osteomyelitis ([place])    a. 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   PAF (paroxysmal atrial fibrillation) ([place])    a. dx 03/2018 in setting of stroke, osteomyelitis.   Sepsis ([place]) 03/20/2018   Stage 3 chronic kidney disease ([place])    pt/family unaware   Status post transmetatarsal amputation of left foot ([place])    Stroke ([place]) 03/2018   Stroke-like episode ([place]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 03/16/2018   Tachycardia 03/20/2018   Wrist fracture    Left    Family History  Problem Relation Age of  Onset   Hypertension Father     Past Surgical History:  Procedure Laterality Date   AMPUTATION Left 03/24/2018   Procedure: Transmetatarsal Amputation Left Foot;  Surgeon: [name], MD;  Location: [facility] [facility] [facility];  Service: Orthopedics;  Laterality: Left;   CARDIOVERSION N/A 05/05/2018   Procedure: CARDIOVERSION;  Surgeon: [name], MD;  Location[facility];  Service: Cardiovascular;  Laterality: N/A;   COLONOSCOPY  12/2017   benign, remission from ulcerative colitis in 90s   I & D EXTREMITY Left 03/17/2018   Procedure: [place];  Surgeon: [name], MD;  Location: [place] [facility];  Service: Orthopedics;  Laterality: Left;   STUMP REVISION Left 05/10/2018   Procedure: REVISION LEFT TRANSMETATARSAL AMPUTATION;  Surgeon: [name], MD;  Location: [facility];  Service: Orthopedics;  Laterality: Left;   Social History   Occupational History   Not on file  Tobacco Use   Smoking status: Former    Packs/day: 1.00    Years: 20.00    Total pack years: 20.00    Types: Cigarettes    Start date: 1963    Quit date: 1985    Years since quitting: 38.7   Smokeless tobacco: Never  Vaping Use   Vaping Use: Never used  Substance and Sexual Activity   Alcohol use: Yes    Alcohol/week: 4.0 standard drinks of alcohol    Types: 4 Cans of beer per week    Comment: social    Drug use: Never   Sexual activity: Not on file

## 2022-07-04 ENCOUNTER — Other Ambulatory Visit: Payer: Self-pay | Admitting: Cardiovascular Disease

## 2022-07-05 ENCOUNTER — Telehealth: Payer: Self-pay | Admitting: Cardiovascular Disease

## 2022-07-05 NOTE — Telephone Encounter (Signed)
[facility] advised that we have the pt taking Toprol XL 50 mg daily... they will reach out to his PCP if he has changed the RX to 100 mg... LM for the pt to clarify the dose that is actually taking. Last RX for the 100 mg sent in by Dr Marisue Humble.

## 2022-07-05 NOTE — Telephone Encounter (Signed)
Pt c/o medication issue:  1. Name of Medication:   TOPROL XL 50 MG 24 hr tablet    2. How are you currently taking this medication (dosage and times per day)? TAKE 1 TABLET DAILY, WITH OR IMMEDIATELY FOLLOWING A MEAL.  3. Are you having a reaction (difficulty breathing--STAT)? No   4. What is your medication issue? Pharmacy calling to see if medication should be filled or not. They state that they have pt on 100 MG of medication and pt just picked up refill last month. Please advise

## 2022-07-08 ENCOUNTER — Ambulatory Visit (INDEPENDENT_AMBULATORY_CARE_PROVIDER_SITE_OTHER): Payer: Medicare Other | Admitting: Orthopedic Surgery

## 2022-07-08 DIAGNOSIS — L97521 Non-pressure chronic ulcer of other part of left foot limited to breakdown of skin: Secondary | ICD-10-CM | POA: Diagnosis not present

## 2022-07-08 DIAGNOSIS — I872 Venous insufficiency (chronic) (peripheral): Secondary | ICD-10-CM | POA: Diagnosis not present

## 2022-07-08 DIAGNOSIS — Z89432 Acquired absence of left foot: Secondary | ICD-10-CM | POA: Diagnosis not present

## 2022-07-09 ENCOUNTER — Encounter: Payer: Self-pay | Admitting: Orthopedic Surgery

## 2022-07-09 NOTE — Progress Notes (Signed)
Office Visit Note   Patient: Kevin [name]           Date of Birth: [birth year]           MRN: [id number] Visit Date: 07/08/2022              Requested by: [name], MD [address] [place],  [place] [address] PCP: [name], MD  Chief Complaint  Patient presents with   Left Foot - Wound Check    Transmet amputation      HPI: Patient is a 81 year old gentleman who presents for follow-up for venous insufficiency both legs with a ulcer left transmetatarsal amputation.  Assessment & Plan: Visit Diagnoses:  1. Venous stasis dermatitis of both lower extremities   2. History of transmetatarsal amputation of left foot ([place])   3. Non-pressure chronic ulcer of other part of left foot limited to breakdown of skin ([place])     Plan: Patient has been provided a prescription for double upright brace and extra-depth shoes with custom orthotic and spacer.  Follow-Up Instructions: Return in about 4 weeks (around 08/05/2022).   Ortho Exam  Patient is alert, oriented, no adenopathy, well-dressed, normal affect, normal respiratory effort. Examination patient has no active venous stasis ulcers.  Examination of the left foot there is no signs of infection he does have a Wagner grade 1 ulcer.  After informed consent a 10 blade knife was used debride the skin and soft tissue back to healthy viable tissue.  The ulcer was 2 cm in diameter after debridement and 2 mm deep there is healthy granulation tissue no signs of infection no exposed bone or tendon.  Imaging: No results found. No images are attached to the encounter.  Labs: Lab Results  Component Value Date   HGBA1C 8.7 (H) 08/20/2019   HGBA1C 10.6 (H) 12/17/2018   HGBA1C 7.6 (H) 03/17/2018   CRP 0.8 08/24/2019   CRP <0.8 08/23/2019   CRP 2.3 (H) 08/22/2019   REPTSTATUS 08/23/2019 FINAL 08/20/2019   GRAMSTAIN  03/17/2018    MODERATE WBC PRESENT, PREDOMINANTLY PMN MODERATE GRAM POSITIVE COCCI FEW GRAM  NEGATIVE RODS    CULT >=100,000 COLONIES/mL ENTEROCOCCUS FAECALIS (A) 08/20/2019   LABORGA ENTEROCOCCUS FAECALIS (A) 08/20/2019     Lab Results  Component Value Date   ALBUMIN 3.3 (L) 08/24/2019   ALBUMIN 3.2 (L) 08/23/2019   ALBUMIN 3.4 (L) 08/22/2019    Lab Results  Component Value Date   MG 2.1 08/24/2019   MG 2.1 08/23/2019   MG 2.1 08/22/2019   No results found for: "VD25OH"  No results found for: "PREALBUMIN"    Latest Ref Rng & Units 04/09/2021   12:00 AM 08/24/2019    1:15 AM 08/23/2019    1:10 AM  CBC EXTENDED  WBC 3.4 - 10.8 x10E3/uL 10.3  11.9  10.7   RBC 4.14 - 5.80 x10E6/uL 4.74  4.50  4.82   Hemoglobin 13.0 - 17.7 g/dL 14.6  13.6  14.5   HCT 37.5 - 51.0 % 43.7  40.2  43.5   Platelets 150 - 450 x10E3/uL 261  275  235   NEUT# 1.7 - 7.7 K/uL  8.7  8.5   Lymph# 0.7 - 4.0 K/uL  2.0  1.5      There is no height or weight on file to calculate BMI.  Orders:  No orders of the defined types were placed in this encounter.  No orders of the defined types were placed in  this encounter.    Procedures: No procedures performed  Clinical Data: No additional findings.  ROS:  All other systems negative, except as noted in the HPI. Review of Systems  Objective: Vital Signs: There were no vitals taken for this visit.  Specialty Comments:  No specialty comments available.  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   Chronic kidney disease, stage 3b ([place]) 08/20/2019   Chronic respiratory failure ([place]) 08/20/2019   Pneumonia due to COVID-19 virus 08/19/2019   UTI (urinary tract infection) 12/15/2018   Chronic diastolic CHF (congestive heart failure) ([place]) 12/15/2018   HLD (hyperlipidemia) 12/15/2018   Dehiscence of amputation stump ([place])     Renal insufficiency 05/06/2018   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   Debility 03/31/2018   Urinary retention with incomplete bladder emptying 03/31/2018   History of transmetatarsal amputation of left foot (HCC)    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 (HCC)    Status post transmetatarsal amputation of left foot (HCC)    Postoperative pain    Benign prostatic hyperplasia with urinary retention    New onset atrial fibrillation (HCC)    Incontinence of feces    Cerebrovascular accident (CVA) ([place])    Benign essential HTN    Diabetes mellitus type 2 in nonobese ([place])    Hypokalemia    Paroxysmal atrial fibrillation (HCC)    Leukocytosis    SIRS (systemic inflammatory response syndrome) ([place])    Essential hypertension 03/20/2018   Tachycardia 03/20/2018   Sepsis ([place]) 03/20/2018   Diabetes ([place]) 03/20/2018   Subacute osteomyelitis, left ankle and foot ([place]) 03/20/2018   A-fib ([place]) 03/20/2018   Stroke-like episode ([place]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 03/16/2018   Strain of left tibialis anterior muscle 12/19/2015   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   Fracture of radius, distal, left, closed 12/19/2012   H/O ulcerative colitis 1993   Past Medical History:  Diagnosis Date   A-fib ([place]) 03/20/2018   Acute blood loss anemia    Benign essential HTN    Benign prostatic hyperplasia with urinary retention    Cerebrovascular accident (CVA) ([place])    CKD (chronic kidney disease), stage III ([place])    Diabetes ([place]) 03/20/2018   Diabetes mellitus type 2 in nonobese [facility])    Essential hypertension 03/20/2018   Former tobacco use    H/O ulcerative colitis 1993   Hypertension    Hypokalemia    Leukocytosis    New onset atrial fibrillation ([place])    Osteomyelitis ([place])    a. 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   PAF (paroxysmal atrial fibrillation) ([place])    a. dx 03/2018 in setting of stroke, osteomyelitis.   Sepsis ([place]) 03/20/2018   Stage 3 chronic  kidney disease ([place])    pt/family unaware   Status post transmetatarsal amputation of left foot ([place])    Stroke ([place]) 03/2018   Stroke-like episode ([place]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 03/16/2018   Tachycardia 03/20/2018   Wrist fracture    Left    Family History  Problem Relation Age of Onset   Hypertension Father     Past Surgical History:  Procedure Laterality Date   AMPUTATION Left 03/24/2018   Procedure: Transmetatarsal Amputation Left Foot;  Surgeon: [name], MD;  Location: [facility];  Service: Orthopedics;  Laterality: Left;  CARDIOVERSION N/A 05/05/2018   Procedure: CARDIOVERSION;  Surgeon: [name], MD;  Location[facility] ENDOSCOPY;  Service: Cardiovascular;  Laterality: N/A;   COLONOSCOPY  12/2017   benign, remission from ulcerative colitis in 90s   I & D EXTREMITY Left 03/17/2018   Procedure: [place];  Surgeon: [name], MD;  Location: [facility];  Service: Orthopedics;  Laterality: Left;   STUMP REVISION Left 05/10/2018   Procedure: REVISION LEFT TRANSMETATARSAL AMPUTATION;  Surgeon: [name], MD;  Location: [facility];  Service: Orthopedics;  Laterality: Left;   Social History   Occupational History   Not on file  Tobacco Use   Smoking status: Former    Packs/day: 1.00    Years: 20.00    Total pack years: 20.00    Types: Cigarettes    Start date: 1963    Quit date: 1985    Years since quitting: 38.8   Smokeless tobacco: Never  Vaping Use   Vaping Use: Never used  Substance and Sexual Activity   Alcohol use: Yes    Alcohol/week: 4.0 standard drinks of alcohol    Types: 4 Cans of beer per week    Comment: social    Drug use: Never   Sexual activity: Not on file

## 2022-07-13 DIAGNOSIS — I4891 Unspecified atrial fibrillation: Secondary | ICD-10-CM | POA: Diagnosis not present

## 2022-07-13 DIAGNOSIS — R809 Proteinuria, unspecified: Secondary | ICD-10-CM | POA: Diagnosis not present

## 2022-07-13 DIAGNOSIS — I509 Heart failure, unspecified: Secondary | ICD-10-CM | POA: Diagnosis not present

## 2022-07-13 DIAGNOSIS — I10 Essential (primary) hypertension: Secondary | ICD-10-CM | POA: Diagnosis not present

## 2022-07-13 DIAGNOSIS — N183 Chronic kidney disease, stage 3 unspecified: Secondary | ICD-10-CM | POA: Diagnosis not present

## 2022-07-13 DIAGNOSIS — E1165 Type 2 diabetes mellitus with hyperglycemia: Secondary | ICD-10-CM | POA: Diagnosis not present

## 2022-07-14 ENCOUNTER — Other Ambulatory Visit: Payer: Self-pay | Admitting: Cardiovascular Disease

## 2022-07-14 DIAGNOSIS — I480 Paroxysmal atrial fibrillation: Secondary | ICD-10-CM

## 2022-07-14 MED ORDER — APIXABAN 5 MG PO TABS: 5.0000 mg | ORAL_TABLET | Freq: Two times a day (BID) | ORAL | 1 refills | Status: DC

## 2022-07-14 NOTE — Telephone Encounter (Signed)
Prescription refill request for Eliquis received. Indication: PAF Last office visit: 11/18/21  Estevan Ryder MD Scr: 1.64 on 04/06/22 Age: 81 Weight: 108.8kg  Based on above findings Eliquis 11m twice daily is the appropriate dose.  Refill already sent in [place]

## 2022-07-14 NOTE — Addendum Note (Signed)
Addended by: Malen Gauze on: 07/14/2022 01:55 PM   Modules accepted: Orders

## 2022-07-14 NOTE — Addendum Note (Signed)
Addended by: Danielle Dess on: 07/14/2022 12:39 PM   Modules accepted: Orders

## 2022-07-17 DIAGNOSIS — Z23 Encounter for immunization: Secondary | ICD-10-CM | POA: Diagnosis not present

## 2022-08-12 ENCOUNTER — Ambulatory Visit (INDEPENDENT_AMBULATORY_CARE_PROVIDER_SITE_OTHER): Payer: Medicare Other | Admitting: Orthopedic Surgery

## 2022-08-12 DIAGNOSIS — I872 Venous insufficiency (chronic) (peripheral): Secondary | ICD-10-CM

## 2022-08-12 DIAGNOSIS — L97521 Non-pressure chronic ulcer of other part of left foot limited to breakdown of skin: Secondary | ICD-10-CM

## 2022-08-12 DIAGNOSIS — Z89432 Acquired absence of left foot: Secondary | ICD-10-CM | POA: Diagnosis not present

## 2022-08-30 ENCOUNTER — Encounter: Payer: Self-pay | Admitting: Orthopedic Surgery

## 2022-08-30 NOTE — Progress Notes (Signed)
Office Visit Note   Patient: Kevin [name]           Date of Birth: March 10, 1941           MRN: [id number] Visit Date: 08/12/2022              Requested by: [name], MD [address] [place],  [place] [address] PCP: [name], MD  Chief Complaint  Patient presents with   Left Foot - Follow-up    Hx transmet amputation revision 2019      HPI: Patient is a 81 year old gentleman who is status post left transmetatarsal amputation revision in 2019.  Patient presents with a plantar ulcer.  Assessment & Plan: Visit Diagnoses:  1. Venous stasis dermatitis of both lower extremities   2. History of transmetatarsal amputation of left foot ([place])   3. Non-pressure chronic ulcer of other part of left foot limited to breakdown of skin ([place])     Plan: Ulcer was debrided pressure offloading.  Follow-Up Instructions: Return in about 4 weeks (around 09/09/2022).   Ortho Exam  Patient is alert, oriented, no adenopathy, well-dressed, normal affect, normal respiratory effort. Examination patient has no ischemic changes to his foot.  There is a Wagner grade 1 ulcer secondary to pressure on the plantar aspect of the first metatarsal.  After informed consent a 10 blade knife was used to debride the skin and soft tissue back to healthy granulation tissue.  Ulcer is 3 cm in diameter 3 mm deep after debridement.  This was touched with silver nitrate for hemostasis.  There is good granulation tissue after debridement.  No exposed bone or tendon.  Imaging: No results found. No images are attached to the encounter.  Labs: Lab Results  Component Value Date   HGBA1C 8.7 (H) 08/20/2019   HGBA1C 10.6 (H) 12/17/2018   HGBA1C 7.6 (H) 03/17/2018   CRP 0.8 08/24/2019   CRP <0.8 08/23/2019   CRP 2.3 (H) 08/22/2019   REPTSTATUS 08/23/2019 FINAL 08/20/2019   GRAMSTAIN  03/17/2018    MODERATE WBC PRESENT, PREDOMINANTLY PMN MODERATE GRAM POSITIVE COCCI FEW GRAM NEGATIVE  RODS    CULT >=100,000 COLONIES/mL ENTEROCOCCUS FAECALIS (A) 08/20/2019   LABORGA ENTEROCOCCUS FAECALIS (A) 08/20/2019     Lab Results  Component Value Date   ALBUMIN 3.3 (L) 08/24/2019   ALBUMIN 3.2 (L) 08/23/2019   ALBUMIN 3.4 (L) 08/22/2019    Lab Results  Component Value Date   MG 2.1 08/24/2019   MG 2.1 08/23/2019   MG 2.1 08/22/2019   No results found for: "VD25OH"  No results found for: "PREALBUMIN"    Latest Ref Rng & Units 04/09/2021   12:00 AM 08/24/2019    1:15 AM 08/23/2019    1:10 AM  CBC EXTENDED  WBC 3.4 - 10.8 x10E3/uL 10.3  11.9  10.7   RBC 4.14 - 5.80 x10E6/uL 4.74  4.50  4.82   Hemoglobin 13.0 - 17.7 g/dL 14.6  13.6  14.5   HCT 37.5 - 51.0 % 43.7  40.2  43.5   Platelets 150 - 450 x10E3/uL 261  275  235   NEUT# 1.7 - 7.7 K/uL  8.7  8.5   Lymph# 0.7 - 4.0 K/uL  2.0  1.5      There is no height or weight on file to calculate BMI.  Orders:  No orders of the defined types were placed in this encounter.  No orders of the defined types were placed in this  encounter.    Procedures: No procedures performed  Clinical Data: No additional findings.  ROS:  All other systems negative, except as noted in the HPI. Review of Systems  Objective: Vital Signs: There were no vitals taken for this visit.  Specialty Comments:  No specialty comments available.  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   Chronic kidney disease, stage 3b ([place]) 08/20/2019   Chronic respiratory failure ([place]) 08/20/2019   Pneumonia due to COVID-19 virus 08/19/2019   UTI (urinary tract infection) 12/15/2018   Chronic diastolic CHF (congestive heart failure) ([place]) 12/15/2018   HLD (hyperlipidemia) 12/15/2018   Dehiscence of amputation stump ([place])    Renal  insufficiency 05/06/2018   Type 2 diabetes mellitus with vascular disease ([place]) 05/06/2018   Debility 03/31/2018   Urinary retention with incomplete bladder emptying 03/31/2018   History of transmetatarsal amputation of left foot (HCC)    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 (HCC)    Status post transmetatarsal amputation of left foot (HCC)    Postoperative pain    Benign prostatic hyperplasia with urinary retention    New onset atrial fibrillation (HCC)    Incontinence of feces    Cerebrovascular accident (CVA) ([place])    Benign essential HTN    Diabetes mellitus type 2 in nonobese ([place])    Hypokalemia    Paroxysmal atrial fibrillation (HCC)    Leukocytosis    SIRS (systemic inflammatory response syndrome) ([place])    Essential hypertension 03/20/2018   Tachycardia 03/20/2018   Sepsis ([place]) 03/20/2018   Diabetes ([place]) 03/20/2018   Subacute osteomyelitis, left ankle and foot ([place]) 03/20/2018   A-fib ([place]) 03/20/2018   Stroke-like episode ([place]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 03/16/2018   Strain of left tibialis anterior muscle 12/19/2015   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   Fracture of radius, distal, left, closed 12/19/2012   H/O ulcerative colitis 1993   Past Medical History:  Diagnosis Date   A-fib ([place]) 03/20/2018   Acute blood loss anemia    Benign essential HTN    Benign prostatic hyperplasia with urinary retention    Cerebrovascular accident (CVA) ([place])    CKD (chronic kidney disease), stage III ([place])    Diabetes ([place]) 03/20/2018   Diabetes mellitus type 2 in nonobese [facility])    Essential hypertension 03/20/2018   Former tobacco use    H/O ulcerative colitis 1993   Hypertension    Hypokalemia    Leukocytosis    New onset atrial fibrillation ([place])    Osteomyelitis ([place])    a. 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   PAF (paroxysmal atrial fibrillation) ([place])    a. dx 03/2018 in setting of stroke, osteomyelitis.   Sepsis ([place]) 03/20/2018   Stage 3 chronic kidney  disease ([place])    pt/family unaware   Status post transmetatarsal amputation of left foot ([place])    Stroke ([place]) 03/2018   Stroke-like episode ([place]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 03/16/2018   Tachycardia 03/20/2018   Wrist fracture    Left    Family History  Problem Relation Age of Onset   Hypertension Father     Past Surgical History:  Procedure Laterality Date   AMPUTATION Left 03/24/2018   Procedure: Transmetatarsal Amputation Left Foot;  Surgeon: [name], MD;  Location: [facility];  Service: Orthopedics;  Laterality: Left;   CARDIOVERSION  N/A 05/05/2018   Procedure: CARDIOVERSION;  Surgeon: [name], MD;  Location: [facility] ENDOSCOPY;  Service: Cardiovascular;  Laterality: N/A;   COLONOSCOPY  12/2017   benign, remission from ulcerative colitis in 90s   I & D EXTREMITY Left 03/17/2018   Procedure: [place];  Surgeon: [name], MD;  Location: [facility];  Service: Orthopedics;  Laterality: Left;   STUMP REVISION Left 05/10/2018   Procedure: REVISION LEFT TRANSMETATARSAL AMPUTATION;  Surgeon: [name], MD;  Location: [facility];  Service: Orthopedics;  Laterality: Left;   Social History   Occupational History   Not on file  Tobacco Use   Smoking status: Former    Packs/day: 1.00    Years: 20.00    Total pack years: 20.00    Types: Cigarettes    Start date: 1963    Quit date: 1985    Years since quitting: 38.9   Smokeless tobacco: Never  Vaping Use   Vaping Use: Never used  Substance and Sexual Activity   Alcohol use: Yes    Alcohol/week: 4.0 standard drinks of alcohol    Types: 4 Cans of beer per week    Comment: social    Drug use: Never   Sexual activity: Not on file

## 2022-09-09 ENCOUNTER — Ambulatory Visit (INDEPENDENT_AMBULATORY_CARE_PROVIDER_SITE_OTHER): Payer: Medicare Other | Admitting: Orthopedic Surgery

## 2022-09-09 DIAGNOSIS — Z89432 Acquired absence of left foot: Secondary | ICD-10-CM

## 2022-09-09 DIAGNOSIS — L97521 Non-pressure chronic ulcer of other part of left foot limited to breakdown of skin: Secondary | ICD-10-CM

## 2022-09-09 DIAGNOSIS — I872 Venous insufficiency (chronic) (peripheral): Secondary | ICD-10-CM

## 2022-09-10 ENCOUNTER — Telehealth: Payer: Self-pay | Admitting: Orthopedic Surgery

## 2022-09-10 NOTE — Telephone Encounter (Signed)
Can you please write rx for pt. Thanks

## 2022-09-10 NOTE — Telephone Encounter (Signed)
Patient called asked if Autumn would send a Rx to [facility] for TMA-Partial Foot Insert for (L) foot. Patient asked if Autumn would text him when Rx is sent to [facility]. The number to contact patient is [phone]

## 2022-09-12 ENCOUNTER — Encounter: Payer: Self-pay | Admitting: Orthopedic Surgery

## 2022-09-12 NOTE — Progress Notes (Signed)
Office Visit Note   Patient: Kevin [name]           Date of Birth: 07-21-1941           MRN: [id number] Visit Date: 09/09/2022              Requested by: [name], MD [address] [place],  [place] [address] PCP: [name], MD  Chief Complaint  Patient presents with   Left Foot - Follow-up    Hx transmet amputation 2019      HPI: Patient is a 81 year old gentleman status post left foot transmetatarsal amputation in 2019.  Patient is a recurrent ulcer with bleeding.  Assessment & Plan: Visit Diagnoses:  1. Venous stasis dermatitis of both lower extremities   2. History of transmetatarsal amputation of left foot ([place])   3. Non-pressure chronic ulcer of other part of left foot limited to breakdown of skin ([place])     Plan: Ulcer was debrided of skin and soft tissue back to healthy viable granulation tissue.  Patient has good orthotics with pressure offloading.  Follow-Up Instructions: Return in about 4 weeks (around 10/07/2022).   Ortho Exam  Patient is alert, oriented, no adenopathy, well-dressed, normal affect, normal respiratory effort. Examination patient has a palpable pulse.  He has dorsiflexion past neutral with good dorsiflexion of the ankle.  He has a Wagner grade 1 ulcer beneath the first metatarsal left foot.  After informed consent a 10 blade knife was used debride the skin and soft tissue back to healthy viable granulation tissue.  The wound measures 2 cm in diameter and 2 mm deep after debridement.  There is no exposed bone or tendon no tunneling.  Imaging: No results found. No images are attached to the encounter.  Labs: Lab Results  Component Value Date   HGBA1C 8.7 (H) 08/20/2019   HGBA1C 10.6 (H) 12/17/2018   HGBA1C 7.6 (H) 03/17/2018   CRP 0.8 08/24/2019   CRP <0.8 08/23/2019   CRP 2.3 (H) 08/22/2019   REPTSTATUS 08/23/2019 FINAL 08/20/2019   GRAMSTAIN  03/17/2018    MODERATE WBC PRESENT, PREDOMINANTLY PMN MODERATE  GRAM POSITIVE COCCI FEW GRAM NEGATIVE RODS    CULT >=100,000 COLONIES/mL ENTEROCOCCUS FAECALIS (A) 08/20/2019   LABORGA ENTEROCOCCUS FAECALIS (A) 08/20/2019     Lab Results  Component Value Date   ALBUMIN 3.3 (L) 08/24/2019   ALBUMIN 3.2 (L) 08/23/2019   ALBUMIN 3.4 (L) 08/22/2019    Lab Results  Component Value Date   MG 2.1 08/24/2019   MG 2.1 08/23/2019   MG 2.1 08/22/2019   No results found for: "VD25OH"  No results found for: "PREALBUMIN"    Latest Ref Rng & Units 04/09/2021   12:00 AM 08/24/2019    1:15 AM 08/23/2019    1:10 AM  CBC EXTENDED  WBC 3.4 - 10.8 x10E3/uL 10.3  11.9  10.7   RBC 4.14 - 5.80 x10E6/uL 4.74  4.50  4.82   Hemoglobin 13.0 - 17.7 g/dL 14.6  13.6  14.5   HCT 37.5 - 51.0 % 43.7  40.2  43.5   Platelets 150 - 450 x10E3/uL 261  275  235   NEUT# 1.7 - 7.7 K/uL  8.7  8.5   Lymph# 0.7 - 4.0 K/uL  2.0  1.5      There is no height or weight on file to calculate BMI.  Orders:  No orders of the defined types were placed in this encounter.  No orders  of the defined types were placed in this encounter.    Procedures: No procedures performed  Clinical Data: No additional findings.  ROS:  All other systems negative, except as noted in the HPI. Review of Systems  Objective: Vital Signs: There were no vitals taken for this visit.  Specialty Comments:  No specialty comments available.  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   Chronic kidney disease, stage 3b ([place]) 08/20/2019   Chronic respiratory failure ([place]) 08/20/2019   Pneumonia due to COVID-19 virus 08/19/2019   UTI (urinary tract infection) 12/15/2018   Chronic diastolic CHF (congestive heart failure) ([place]) 12/15/2018   HLD (hyperlipidemia) 12/15/2018   Dehiscence of  amputation stump ([place])    Renal insufficiency 05/06/2018   Type 2 diabetes mellitus with vascular disease ([place]) 05/06/2018   Debility 03/31/2018   Urinary retention with incomplete bladder emptying 03/31/2018   History of transmetatarsal amputation of left foot (HCC)    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 (HCC)    Status post transmetatarsal amputation of left foot (HCC)    Postoperative pain    Benign prostatic hyperplasia with urinary retention    New onset atrial fibrillation (HCC)    Incontinence of feces    Cerebrovascular accident (CVA) ([place])    Benign essential HTN    Diabetes mellitus type 2 in nonobese ([place])    Hypokalemia    Paroxysmal atrial fibrillation (HCC)    Leukocytosis    SIRS (systemic inflammatory response syndrome) ([place])    Essential hypertension 03/20/2018   Tachycardia 03/20/2018   Sepsis ([place]) 03/20/2018   Diabetes ([place]) 03/20/2018   Subacute osteomyelitis, left ankle and foot ([place]) 03/20/2018   A-fib ([place]) 03/20/2018   Stroke-like episode ([place]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 03/16/2018   Strain of left tibialis anterior muscle 12/19/2015   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   Fracture of radius, distal, left, closed 12/19/2012   H/O ulcerative colitis 1993   Past Medical History:  Diagnosis Date   A-fib ([place]) 03/20/2018   Acute blood loss anemia    Benign essential HTN    Benign prostatic hyperplasia with urinary retention    Cerebrovascular accident (CVA) ([place])    CKD (chronic kidney disease), stage III ([place])    Diabetes ([place]) 03/20/2018   Diabetes mellitus type 2 in nonobese [facility])    Essential hypertension 03/20/2018   Former tobacco use    H/O ulcerative colitis 1993   Hypertension    Hypokalemia    Leukocytosis    New onset atrial fibrillation ([place])    Osteomyelitis ([place])    a. 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   PAF (paroxysmal atrial fibrillation) ([place])    a. dx 03/2018 in setting of stroke, osteomyelitis.   Sepsis ([place])  03/20/2018   Stage 3 chronic kidney disease ([place])    pt/family unaware   Status post transmetatarsal amputation of left foot ([place])    Stroke ([place]) 03/2018   Stroke-like episode ([place]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 03/16/2018   Tachycardia 03/20/2018   Wrist fracture    Left    Family History  Problem Relation Age of Onset   Hypertension Father     Past Surgical History:  Procedure Laterality Date   AMPUTATION Left 03/24/2018   Procedure: Transmetatarsal Amputation Left Foot;  Surgeon: [name], MD;  Location: [facility];  Service: Orthopedics;  Laterality: Left;   CARDIOVERSION N/A 05/05/2018   Procedure: CARDIOVERSION;  Surgeon: [name], MD;  Location: [facility] ENDOSCOPY;  Service: Cardiovascular;  Laterality: N/A;   COLONOSCOPY  12/2017   benign, remission from ulcerative colitis in 90s   I & D EXTREMITY Left 03/17/2018   Procedure: [place];  Surgeon: [name], MD;  Location: [facility];  Service: Orthopedics;  Laterality: Left;   STUMP REVISION Left 05/10/2018   Procedure: REVISION LEFT TRANSMETATARSAL AMPUTATION;  Surgeon: [name], MD;  Location: [facility];  Service: Orthopedics;  Laterality: Left;   Social History   Occupational History   Not on file  Tobacco Use   Smoking status: Former    Packs/day: 1.00    Years: 20.00    Total pack years: 20.00    Types: Cigarettes    Start date: 1963    Quit date: 1985    Years since quitting: 38.9   Smokeless tobacco: Never  Vaping Use   Vaping Use: Never used  Substance and Sexual Activity   Alcohol use: Yes    Alcohol/week: 4.0 standard drinks of alcohol    Types: 4 Cans of beer per week    Comment: social    Drug use: Never   Sexual activity: Not on file

## 2022-10-07 ENCOUNTER — Ambulatory Visit (INDEPENDENT_AMBULATORY_CARE_PROVIDER_SITE_OTHER): Payer: Medicare Other | Admitting: Orthopedic Surgery

## 2022-10-07 DIAGNOSIS — L97521 Non-pressure chronic ulcer of other part of left foot limited to breakdown of skin: Secondary | ICD-10-CM | POA: Diagnosis not present

## 2022-10-18 ENCOUNTER — Encounter: Payer: Self-pay | Admitting: Orthopedic Surgery

## 2022-10-18 DIAGNOSIS — R351 Nocturia: Secondary | ICD-10-CM | POA: Diagnosis not present

## 2022-10-18 DIAGNOSIS — R3914 Feeling of incomplete bladder emptying: Secondary | ICD-10-CM | POA: Diagnosis not present

## 2022-10-18 DIAGNOSIS — N401 Enlarged prostate with lower urinary tract symptoms: Secondary | ICD-10-CM | POA: Diagnosis not present

## 2022-10-18 NOTE — Progress Notes (Signed)
Office Visit Note   Patient: Kevin [name]           Date of Birth: 23-Feb-1941           MRN: [id number] Visit Date: 10/07/2022              Requested by: [name], MD [address] [place],  [place] [address] PCP: [name], MD  Chief Complaint  Patient presents with   Left Foot - Wound Check      HPI: Patient is a 82 year old gentleman who presents in follow-up for a plantar ulcer left foot with a transmetatarsal amputation.  Assessment & Plan: Visit Diagnoses:  1. Non-pressure chronic ulcer of other part of left foot limited to breakdown of skin ([place])     Plan: Ulcer was debrided patient has good protective shoe wear.  Follow-Up Instructions: Return in about 4 weeks (around 11/04/2022).   Ortho Exam  Patient is alert, oriented, no adenopathy, well-dressed, normal affect, normal respiratory effort. Examination patient has a palpable pulse.  He has a plantar ulcer left foot Wagner grade 1.  There is no cellulitis no odor no drainage.  After informed consent a 10 blade knife was used to debride the skin and soft tissue back to healthy viable tissue.  The ulcer is 2 cm in diameter 2 mm deep after debridement.  There is no undermining no exposed bone or tendon.  There is healthy granulation tissue in the wound bed.  Imaging: No results found. No images are attached to the encounter.  Labs: Lab Results  Component Value Date   HGBA1C 8.7 (H) 08/20/2019   HGBA1C 10.6 (H) 12/17/2018   HGBA1C 7.6 (H) 03/17/2018   CRP 0.8 08/24/2019   CRP <0.8 08/23/2019   CRP 2.3 (H) 08/22/2019   REPTSTATUS 08/23/2019 FINAL 08/20/2019   GRAMSTAIN  03/17/2018    MODERATE WBC PRESENT, PREDOMINANTLY PMN MODERATE GRAM POSITIVE COCCI FEW GRAM NEGATIVE RODS    CULT >=100,000 COLONIES/mL ENTEROCOCCUS FAECALIS (A) 08/20/2019   LABORGA ENTEROCOCCUS FAECALIS (A) 08/20/2019     Lab Results  Component Value Date   ALBUMIN 3.3 (L) 08/24/2019   ALBUMIN 3.2 (L)  08/23/2019   ALBUMIN 3.4 (L) 08/22/2019    Lab Results  Component Value Date   MG 2.1 08/24/2019   MG 2.1 08/23/2019   MG 2.1 08/22/2019   No results found for: "VD25OH"  No results found for: "PREALBUMIN"    Latest Ref Rng & Units 04/09/2021   12:00 AM 08/24/2019    1:15 AM 08/23/2019    1:10 AM  CBC EXTENDED  WBC 3.4 - 10.8 x10E3/uL 10.3  11.9  10.7   RBC 4.14 - 5.80 x10E6/uL 4.74  4.50  4.82   Hemoglobin 13.0 - 17.7 g/dL 14.6  13.6  14.5   HCT 37.5 - 51.0 % 43.7  40.2  43.5   Platelets 150 - 450 x10E3/uL 261  275  235   NEUT# 1.7 - 7.7 K/uL  8.7  8.5   Lymph# 0.7 - 4.0 K/uL  2.0  1.5      There is no height or weight on file to calculate BMI.  Orders:  No orders of the defined types were placed in this encounter.  No orders of the defined types were placed in this encounter.    Procedures: No procedures performed  Clinical Data: No additional findings.  ROS:  All other systems negative, except as noted in the HPI. Review of Systems  Objective: Vital Signs: There were no vitals taken for this visit.  Specialty Comments:  No specialty comments available.  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   Chronic kidney disease, stage 3b ([place]) 08/20/2019   Chronic respiratory failure ([place]) 08/20/2019   Pneumonia due to COVID-19 virus 08/19/2019   UTI (urinary tract infection) 12/15/2018   Chronic diastolic CHF (congestive heart failure) ([place]) 12/15/2018   HLD (hyperlipidemia) 12/15/2018   Dehiscence of amputation stump ([place])    Renal insufficiency 05/06/2018   Type 2 diabetes mellitus with vascular disease ([place]) 05/06/2018   Debility 03/31/2018   Urinary retention with incomplete bladder emptying 03/31/2018   History of transmetatarsal amputation  of left foot (HCC)    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 (HCC)    Status post transmetatarsal amputation of left foot (HCC)    Postoperative pain    Benign prostatic hyperplasia with urinary retention    New onset atrial fibrillation (HCC)    Incontinence of feces    Cerebrovascular accident (CVA) ([place])    Benign essential HTN    Diabetes mellitus type 2 in nonobese ([place])    Hypokalemia    Paroxysmal atrial fibrillation (HCC)    Leukocytosis    SIRS (systemic inflammatory response syndrome) ([place])    Essential hypertension 03/20/2018   Tachycardia 03/20/2018   Sepsis ([place]) 03/20/2018   Diabetes ([place]) 03/20/2018   Subacute osteomyelitis, left ankle and foot ([place]) 03/20/2018   A-fib ([place]) 03/20/2018   Stroke-like episode ([place]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 03/16/2018   Strain of left tibialis anterior muscle 12/19/2015   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   Fracture of radius, distal, left, closed 12/19/2012   H/O ulcerative colitis 1993   Past Medical History:  Diagnosis Date   A-fib ([place]) 03/20/2018   Acute blood loss anemia    Benign essential HTN    Benign prostatic hyperplasia with urinary retention    Cerebrovascular accident (CVA) ([place])    CKD (chronic kidney disease), stage III ([place])    Diabetes ([place]) 03/20/2018   Diabetes mellitus type 2 in nonobese [facility])    Essential hypertension 03/20/2018   Former tobacco use    H/O ulcerative colitis 1993   Hypertension    Hypokalemia    Leukocytosis    New onset atrial fibrillation ([place])    Osteomyelitis ([place])    a. 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   PAF (paroxysmal atrial fibrillation) ([place])    a. dx 03/2018 in setting of stroke, osteomyelitis.   Sepsis ([place]) 03/20/2018   Stage 3 chronic kidney disease ([place])    pt/family unaware   Status post transmetatarsal amputation of left foot ([place])    Stroke ([place]) 03/2018   Stroke-like episode ([place]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 03/16/2018   Tachycardia 03/20/2018   Wrist fracture    Left     Family History  Problem Relation Age of Onset   Hypertension Father     Past Surgical History:  Procedure Laterality Date   AMPUTATION Left 03/24/2018   Procedure: Transmetatarsal Amputation Left Foot;  Surgeon: [name], MD;  Location: [facility];  Service: Orthopedics;  Laterality: Left;   CARDIOVERSION N/A 05/05/2018   Procedure: CARDIOVERSION;  Surgeon: [name], MD;  Location: [facility];  Service: Cardiovascular;  Laterality: N/A;   COLONOSCOPY  12/2017   benign, remission from  ulcerative colitis in 90s   I & D EXTREMITY Left 03/17/2018   Procedure: IRRIGATION AND DEBRIDEMENT FOOT;  Surgeon: [name], MD;  Location: [facility];  Service: Orthopedics;  Laterality: Left;   STUMP REVISION Left 05/10/2018   Procedure: REVISION LEFT TRANSMETATARSAL AMPUTATION;  Surgeon: [name], MD;  Location: [facility];  Service: Orthopedics;  Laterality: Left;   Social History   Occupational History   Not on file  Tobacco Use   Smoking status: Former    Packs/day: 1.00    Years: 20.00    Total pack years: 20.00    Types: Cigarettes    Start date: 1963    Quit date: 1985    Years since quitting: 39.0   Smokeless tobacco: Never  Vaping Use   Vaping Use: Never used  Substance and Sexual Activity   Alcohol use: Yes    Alcohol/week: 4.0 standard drinks of alcohol    Types: 4 Cans of beer per week    Comment: social    Drug use: Never   Sexual activity: Not on file

## 2022-10-20 ENCOUNTER — Encounter: Payer: Self-pay | Admitting: Cardiovascular Disease

## 2022-10-20 DIAGNOSIS — K5190 Ulcerative colitis, unspecified, without complications: Secondary | ICD-10-CM | POA: Diagnosis not present

## 2022-10-20 DIAGNOSIS — Z7901 Long term (current) use of anticoagulants: Secondary | ICD-10-CM | POA: Diagnosis not present

## 2022-10-20 DIAGNOSIS — Z86010 Personal history of colonic polyps: Secondary | ICD-10-CM | POA: Diagnosis not present

## 2022-10-21 ENCOUNTER — Telehealth: Payer: Self-pay | Admitting: *Deleted

## 2022-10-21 NOTE — Telephone Encounter (Signed)
   Pre-operative Risk Assessment    Patient Name: Kevin [name]  DOB: June 28, 1941 MRN: [id number]      Request for Surgical Clearance    Procedure:   Colonoscopy  Date of Surgery:  Clearance TBD                                 Surgeon:  Dr. Therisa Doyne Surgeon's Group or Practice Name:  [facility] Phone number:  [phone] Fax number:  [phone]   Type of Clearance Requested:   - Medical  - Pharmacy:  Hold Apixaban (Eliquis) Not indicated   Type of Anesthesia:  Not Indicated   Additional requests/questions:    Signed, Greer Ee   10/21/2022, 7:21 AM

## 2022-10-22 ENCOUNTER — Other Ambulatory Visit: Payer: Self-pay

## 2022-10-22 ENCOUNTER — Telehealth: Payer: Self-pay | Admitting: Cardiovascular Disease

## 2022-10-22 MED ORDER — DILTIAZEM HCL ER COATED BEADS 120 MG PO CP24: 120.0000 mg | ORAL_CAPSULE | Freq: Every day | ORAL | 1 refills | Status: DC

## 2022-10-22 NOTE — Telephone Encounter (Signed)
*  STAT* If patient is at the pharmacy, call can be transferred to refill team.   1. Which medications need to be refilled? (please list name of each medication and dose if known) diltiazem (CARDIZEM CD) 120 MG 24 hr capsule   2. Which pharmacy/location (including street and city if local pharmacy) is medication to be sent to? [facility] ([place], [address]   3. Do they need a 30 day or 90 day supply? [place]

## 2022-10-24 NOTE — Telephone Encounter (Signed)
Patient with diagnosis of atrial fibrillation on Eliquis for anticoagulation.    Procedure: colonoscopy Date of procedure: TBD   CHA2DS2-VASc Score = 7   This indicates a 11.2% annual risk of stroke. The patient's score is based upon: CHF History: 1 HTN History: 1 Diabetes History: 1 Stroke History: 2 Vascular Disease History: 0 Age Score: 2 Gender Score: 0    CrCl 42 Platelet count - last drawn 03/2021  Please have platelets drawn for final clearance

## 2022-10-25 ENCOUNTER — Other Ambulatory Visit: Payer: Self-pay | Admitting: *Deleted

## 2022-10-25 MED ORDER — METOPROLOL SUCCINATE ER 50 MG PO TB24: ORAL_TABLET | ORAL | 1 refills | Status: DC

## 2022-10-25 NOTE — Telephone Encounter (Signed)
   Name: [place] Merida  DOB: 1941/06/30  MRN: [id number]  Primary Cardiologist: Lauree Chandler, MD  Chart reviewed as part of pre-operative protocol coverage. Because of Delton Duane Ruggirello's past medical history and time since last visit, he will require a follow-up in-office visit in order to better assess preoperative cardiovascular risk.  Pre-op covering staff: - Please schedule appointment and call patient to inform them. If patient already had an upcoming appointment within acceptable timeframe, please add "pre-op clearance" to the appointment notes so provider is aware. - Please contact requesting surgeon's office via preferred method (i.e, phone, fax) to inform them of need for appointment prior to surgery.  (Eliquis clearance is awaiting CBC)    Trudi Ida, NP  10/25/2022, 8:42 AM

## 2022-10-25 NOTE — Telephone Encounter (Signed)
Called and patient is now scheduled to see Coletta Memos, NP on 11/25/22 at 11:20 AM. Patient was informed to arrive to the office at 11:00 AM. He verbalized understanding and all (if any) questions were answered.

## 2022-10-27 ENCOUNTER — Other Ambulatory Visit: Payer: Self-pay

## 2022-10-27 DIAGNOSIS — N35911 Unspecified urethral stricture, male, meatal: Secondary | ICD-10-CM | POA: Diagnosis not present

## 2022-10-27 DIAGNOSIS — R8271 Bacteriuria: Secondary | ICD-10-CM | POA: Diagnosis not present

## 2022-10-27 DIAGNOSIS — R3914 Feeling of incomplete bladder emptying: Secondary | ICD-10-CM | POA: Diagnosis not present

## 2022-10-27 DIAGNOSIS — N3020 Other chronic cystitis without hematuria: Secondary | ICD-10-CM | POA: Diagnosis not present

## 2022-10-27 MED ORDER — DILTIAZEM HCL ER COATED BEADS 120 MG PO CP24: 120.0000 mg | ORAL_CAPSULE | Freq: Every day | ORAL | 0 refills | Status: DC

## 2022-10-27 NOTE — Telephone Encounter (Signed)
As long as CBC/platelet count are stable with upcoming labs, recommend pt hold Eliquis for 1 day prior to procedure and resume as soon as safely possible after given elevated CV risk.

## 2022-11-02 DIAGNOSIS — N35911 Unspecified urethral stricture, male, meatal: Secondary | ICD-10-CM | POA: Diagnosis not present

## 2022-11-02 DIAGNOSIS — R338 Other retention of urine: Secondary | ICD-10-CM | POA: Diagnosis not present

## 2022-11-02 DIAGNOSIS — R3914 Feeling of incomplete bladder emptying: Secondary | ICD-10-CM | POA: Diagnosis not present

## 2022-11-02 DIAGNOSIS — N401 Enlarged prostate with lower urinary tract symptoms: Secondary | ICD-10-CM | POA: Diagnosis not present

## 2022-11-03 ENCOUNTER — Ambulatory Visit (HOSPITAL_BASED_OUTPATIENT_CLINIC_OR_DEPARTMENT_OTHER)
Admission: RE | Admit: 2022-11-03 | Discharge: 2022-11-03 | Disposition: A | Discharge status: Home / Self Care | Payer: Medicare Other | Source: Ambulatory Visit | Attending: Emergency Medicine | Admitting: Emergency Medicine

## 2022-11-03 ENCOUNTER — Encounter (HOSPITAL_BASED_OUTPATIENT_CLINIC_OR_DEPARTMENT_OTHER): Payer: Self-pay

## 2022-11-03 DIAGNOSIS — R911 Solitary pulmonary nodule: Secondary | ICD-10-CM | POA: Diagnosis not present

## 2022-11-03 DIAGNOSIS — R918 Other nonspecific abnormal finding of lung field: Secondary | ICD-10-CM | POA: Insufficient documentation

## 2022-11-03 DIAGNOSIS — J432 Centrilobular emphysema: Secondary | ICD-10-CM | POA: Diagnosis not present

## 2022-11-09 DIAGNOSIS — N401 Enlarged prostate with lower urinary tract symptoms: Secondary | ICD-10-CM | POA: Diagnosis not present

## 2022-11-09 DIAGNOSIS — R8271 Bacteriuria: Secondary | ICD-10-CM | POA: Diagnosis not present

## 2022-11-09 DIAGNOSIS — R3914 Feeling of incomplete bladder emptying: Secondary | ICD-10-CM | POA: Diagnosis not present

## 2022-11-11 ENCOUNTER — Ambulatory Visit: Payer: Medicare Other | Admitting: Orthopedic Surgery

## 2022-11-11 ENCOUNTER — Ambulatory Visit (INDEPENDENT_AMBULATORY_CARE_PROVIDER_SITE_OTHER): Payer: Medicare Other | Admitting: Orthopedic Surgery

## 2022-11-11 DIAGNOSIS — L97521 Non-pressure chronic ulcer of other part of left foot limited to breakdown of skin: Secondary | ICD-10-CM | POA: Diagnosis not present

## 2022-11-11 DIAGNOSIS — Z89432 Acquired absence of left foot: Secondary | ICD-10-CM

## 2022-11-12 ENCOUNTER — Encounter: Payer: Self-pay | Admitting: Orthopedic Surgery

## 2022-11-12 NOTE — Progress Notes (Signed)
Office Visit Note   Patient: Kevin [name]           Date of Birth: [birth year]           MRN: [id number] Visit Date: 11/11/2022              Requested by: [name], MD 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  Assessment & Plan: Visit Diagnoses: No diagnosis found.  Plan: Ulcer was debrided back to healthy viable tissue.  Follow-Up Instructions: No follow-ups on file.   Ortho Exam  Patient is alert, oriented, no adenopathy, well-dressed, normal affect, normal respiratory effort. Examination patient has a palpable pulse he has no cellulitis no odor or drainage.  There is a Wagner grade 1 ulcer on the plantar aspect of the left transmetatarsal amputation.  After informed consent a 10 blade knife was used to debride the skin and soft tissue back to healthy viable bleeding granulation tissue.  This was touched with silver nitrate.  The ulcer did not probe to bone or tendon.  Compression dressing was applied.  Imaging: No results found. No images are attached to the encounter.  Labs: Lab Results  Component Value Date   HGBA1C 8.7 (H) 08/20/2019   HGBA1C 10.6 (H) 12/17/2018   HGBA1C 7.6 (H) 03/17/2018   CRP 0.8 08/24/2019   CRP <0.8 08/23/2019   CRP 2.3 (H) 08/22/2019   REPTSTATUS 08/23/2019 FINAL 08/20/2019   GRAMSTAIN  03/17/2018    MODERATE WBC PRESENT, PREDOMINANTLY PMN MODERATE GRAM POSITIVE COCCI FEW GRAM NEGATIVE RODS    CULT >=100,000 COLONIES/mL ENTEROCOCCUS FAECALIS (A) 08/20/2019   LABORGA ENTEROCOCCUS FAECALIS (A) 08/20/2019     Lab Results  Component Value Date   ALBUMIN 3.3 (L) 08/24/2019   ALBUMIN 3.2 (L) 08/23/2019   ALBUMIN 3.4 (L) 08/22/2019    Lab Results  Component Value Date   MG  2.1 08/24/2019   MG 2.1 08/23/2019   MG 2.1 08/22/2019   No results found for: "VD25OH"  No results found for: "PREALBUMIN"    Latest Ref Rng & Units 04/09/2021   12:00 AM 08/24/2019    1:15 AM 08/23/2019    1:10 AM  CBC EXTENDED  WBC 3.4 - 10.8 x10E3/uL 10.3  11.9  10.7   RBC 4.14 - 5.80 x10E6/uL 4.74  4.50  4.82   Hemoglobin 13.0 - 17.7 g/dL 14.6  13.6  14.5   HCT 37.5 - 51.0 % 43.7  40.2  43.5   Platelets 150 - 450 x10E3/uL 261  275  235   NEUT# 1.7 - 7.7 K/uL  8.7  8.5   Lymph# 0.7 - 4.0 K/uL  2.0  1.5      There is no height or weight on file to calculate BMI.  Orders:  No orders of the defined types were placed in this encounter.  No orders of the defined types were placed in this encounter.    Procedures: No procedures performed  Clinical Data: No additional findings.  ROS:  All other systems negative, except as noted in the HPI. Review of Systems  Objective: Vital Signs: There were no vitals taken for this visit.  Specialty Comments:  No specialty comments available.  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   Chronic respiratory failure ([place]) 08/20/2019   Pneumonia due to COVID-19 virus 08/19/2019   UTI (urinary tract infection) 12/15/2018   Chronic diastolic CHF (congestive heart failure) ([place]) 12/15/2018   HLD (hyperlipidemia) 12/15/2018   Dehiscence of amputation stump ([place])    Renal insufficiency 05/06/2018   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   Debility 03/31/2018   Urinary retention with incomplete bladder emptying 03/31/2018   History of transmetatarsal amputation of left foot (HCC)    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 (HCC)     Status post transmetatarsal amputation of left foot (HCC)    Postoperative pain    Benign prostatic hyperplasia with urinary retention    New onset atrial fibrillation (HCC)    Incontinence of feces    Cerebrovascular accident (CVA) ([place])    Benign essential HTN    Diabetes mellitus type 2 in nonobese ([place])    Hypokalemia    Paroxysmal atrial fibrillation (HCC)    Leukocytosis    SIRS (systemic inflammatory response syndrome) ([place])    Essential hypertension 03/20/2018   Tachycardia 03/20/2018   Sepsis ([place]) 03/20/2018   Diabetes ([place]) 03/20/2018   Subacute osteomyelitis, left ankle and foot ([place]) 03/20/2018   A-fib ([place]) 03/20/2018   Stroke-like episode ([place]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 03/16/2018   Strain of left tibialis anterior muscle 12/19/2015   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   Fracture of radius, distal, left, closed 12/19/2012   H/O ulcerative colitis 1993   Past Medical History:  Diagnosis Date   A-fib ([place]) 03/20/2018   Acute blood loss anemia    Benign essential HTN    Benign prostatic hyperplasia with urinary retention    Cerebrovascular accident (CVA) ([place])    CKD (chronic kidney disease), stage III ([place])    Diabetes ([place]) 03/20/2018   Diabetes mellitus type 2 in nonobese [facility])    Essential hypertension 03/20/2018   Former tobacco use    H/O ulcerative colitis 1993   Hypertension    Hypokalemia    Leukocytosis    New onset atrial fibrillation ([place])    Osteomyelitis ([place])    a. 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   PAF (paroxysmal atrial fibrillation) ([place])    a. dx 03/2018 in setting of stroke, osteomyelitis.   Sepsis ([place]) 03/20/2018   Stage 3 chronic kidney disease ([place])    pt/family unaware   Status post transmetatarsal amputation of left foot ([place])    Stroke ([name]) 03/2018   Stroke-like episode ([place]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 03/16/2018   Tachycardia 03/20/2018   Wrist fracture    Left    Family History  Problem Relation Age of Onset   Hypertension Father     Past  Surgical History:  Procedure Laterality Date   AMPUTATION Left 03/24/2018   Procedure: Transmetatarsal Amputation Left Foot;  Surgeon: [name], MD;  Location: [facility];  Service: Orthopedics;  Laterality: Left;   CARDIOVERSION N/A 05/05/2018   Procedure: CARDIOVERSION;  Surgeon: [name], MD;  [facility];  Service: Cardiovascular;  Laterality: N/A;   COLONOSCOPY  12/2017   benign, remission from ulcerative colitis in 90s   I & D EXTREMITY Left 03/17/2018   Procedure: [place];  Surgeon: [name], MD;  Location: [facility];  Service: Orthopedics;  Laterality: Left;   STUMP REVISION Left 05/10/2018   Procedure: REVISION LEFT TRANSMETATARSAL AMPUTATION;  Surgeon: [name], MD;  Location: [facility];  Service: Orthopedics;  Laterality: Left;   Social History   Occupational History   Not on file  Tobacco Use   Smoking status: Former    Packs/day: 1.00    Years: 20.00    Total pack years: 20.00    Types: Cigarettes    Start date: 1963    Quit date: 1985    Years since quitting: 39.1   Smokeless tobacco: Never  Vaping Use   Vaping Use: Never used  Substance and Sexual Activity   Alcohol use: Yes    Alcohol/week: 4.0 standard drinks of alcohol    Types: 4 Cans of beer per week    Comment: social    Drug use: Never   Sexual activity: Not on file

## 2022-11-15 DIAGNOSIS — R809 Proteinuria, unspecified: Secondary | ICD-10-CM | POA: Diagnosis not present

## 2022-11-15 DIAGNOSIS — N183 Chronic kidney disease, stage 3 unspecified: Secondary | ICD-10-CM | POA: Diagnosis not present

## 2022-11-15 DIAGNOSIS — E11649 Type 2 diabetes mellitus with hypoglycemia without coma: Secondary | ICD-10-CM | POA: Diagnosis not present

## 2022-11-15 DIAGNOSIS — I10 Essential (primary) hypertension: Secondary | ICD-10-CM | POA: Diagnosis not present

## 2022-11-15 DIAGNOSIS — Z89432 Acquired absence of left foot: Secondary | ICD-10-CM | POA: Diagnosis not present

## 2022-11-15 DIAGNOSIS — I700 Atherosclerosis of aorta: Secondary | ICD-10-CM | POA: Diagnosis not present

## 2022-11-15 DIAGNOSIS — E1165 Type 2 diabetes mellitus with hyperglycemia: Secondary | ICD-10-CM | POA: Diagnosis not present

## 2022-11-15 DIAGNOSIS — E7800 Pure hypercholesterolemia, unspecified: Secondary | ICD-10-CM | POA: Diagnosis not present

## 2022-11-15 DIAGNOSIS — I4891 Unspecified atrial fibrillation: Secondary | ICD-10-CM | POA: Diagnosis not present

## 2022-11-15 DIAGNOSIS — I509 Heart failure, unspecified: Secondary | ICD-10-CM | POA: Diagnosis not present

## 2022-11-19 DIAGNOSIS — R3914 Feeling of incomplete bladder emptying: Secondary | ICD-10-CM | POA: Diagnosis not present

## 2022-11-19 DIAGNOSIS — R351 Nocturia: Secondary | ICD-10-CM | POA: Diagnosis not present

## 2022-11-23 DIAGNOSIS — R300 Dysuria: Secondary | ICD-10-CM | POA: Diagnosis not present

## 2022-11-23 DIAGNOSIS — N3020 Other chronic cystitis without hematuria: Secondary | ICD-10-CM | POA: Diagnosis not present

## 2022-11-23 DIAGNOSIS — R351 Nocturia: Secondary | ICD-10-CM | POA: Diagnosis not present

## 2022-11-23 DIAGNOSIS — R338 Other retention of urine: Secondary | ICD-10-CM | POA: Diagnosis not present

## 2022-11-23 NOTE — H&P (View-Only) (Signed)
Cardiology Clinic Note   Patient Name: Kevin [name] Date of Encounter: 11/25/2022  Primary Care Provider:  [name], MD Primary Cardiologist:  Lauree Chandler, MD  Patient Profile    Kevin [name] 82 year old male presents to the clinic today for follow-up evaluation of his chronic diastolic CHF, PAF, and preoperative cardiac evaluation.  Past Medical History    Past Medical History:  Diagnosis Date   A-fib ([place]) 03/20/2018   Acute blood loss anemia    Benign essential HTN    Benign prostatic hyperplasia with urinary retention    Cerebrovascular accident (CVA) ([place])    CKD (chronic kidney disease), stage III ([place])    Diabetes ([place]) 03/20/2018   Diabetes mellitus type 2 in nonobese [place])    Essential hypertension 03/20/2018   Former tobacco use    H/O ulcerative colitis 1993   Hypertension    Hypokalemia    Leukocytosis    New onset atrial fibrillation ([place])    Osteomyelitis ([place])    a. 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   PAF (paroxysmal atrial fibrillation) ([place])    a. dx 03/2018 in setting of stroke, osteomyelitis.   Sepsis ([place]) 03/20/2018   Stage 3 chronic kidney disease ([place])    pt/family unaware   Status post transmetatarsal amputation of left foot ([place])    Stroke ([place]) 03/2018   Stroke-like episode ([place]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 03/16/2018   Tachycardia 03/20/2018   Wrist fracture    Left   Past Surgical History:  Procedure Laterality Date   AMPUTATION Left 03/24/2018   Procedure: Transmetatarsal Amputation Left Foot;  Surgeon: [name], MD;  Location: [facility];  Service: Orthopedics;  Laterality: Left;   CARDIOVERSION N/A 05/05/2018   Procedure: CARDIOVERSION;  Surgeon: [name], MD;  [facility] ENDOSCOPY;  Service: Cardiovascular;  Laterality: N/A;   COLONOSCOPY  12/2017   benign, remission from ulcerative colitis in 90s   I & D EXTREMITY Left 03/17/2018   Procedure: [place];  Surgeon: [name],  MD;  Location: [facility];  Service: Orthopedics;  Laterality: Left;   STUMP REVISION Left 05/10/2018   Procedure: REVISION LEFT TRANSMETATARSAL AMPUTATION;  Surgeon: [name], MD;  Location: [facility];  Service: Orthopedics;  Laterality: Left;    Allergies  No Known Allergies  History of Present Illness    Victorino Lennon has a PMH of HTN, PAF, type 2 diabetes, chronic diastolic CHF, COVID-pneumonia, chronic respiratory failure, COPD, left distal radius fracture, BPH, UTI, CKD stage IIIb, tachycardia, HLD, amputation, CVA, and ulcerative colitis.  He was admitted in June 2019 with CVA and received tPA.  He was noted to be in atrial fibrillation during his hospitalization.  He became septic from his left foot and required transmetatarsal amputation.  He was started on apixaban.  He was converted to sinus rhythm 8/19.  His echocardiogram 6/19 showed normal LV function.  Nuclear stress testing 7/21 showed no ischemia.  His echocardiogram 7/21 showed an LVEF of 55% and no significant valvular abnormality.  He followed up with Dr. [name] 11/18/2021.  During that time he denied chest pain dyspnea and palpitations.  He denied lower extremity swelling, orthopnea and PND.  He denied near syncope and syncope.  His blood pressure was well-controlled.  Follow-up was planned for 12 months.  He presents to the clinic today for follow-up evaluation states he feels somewhat short of breath.  He has been working on being more active.  He has been using  his treadmill and reports he has been walking slow.  He is planning to travel to [facility] in April for a few weeks.  His EKG today shows atrial fibrillation left axis deviation right bundle branch block 74 bpm.  Case was reviewed with Dr. Debara Pickett and patient wishes to proceed with DCCV.  I will order CBC, BMP, schedule cardioversion and have him follow-up after the procedure.  We reviewed the importance of postponing his colonoscopy and continuing on anticoagulation for  at least 4 weeks post DCCV.  He expressed understanding.    Home Medications    Prior to Admission medications   Medication Sig Start Date End Date Taking? Authorizing Provider  albuterol (VENTOLIN HFA) 108 (90 Base) MCG/ACT inhaler Inhale 2 puffs into the lungs every 6 (six) hours as needed for wheezing or shortness of breath. 06/03/20   Brand Males, MD  apixaban (ELIQUIS) 5 MG TABS tablet Take 1 tablet (5 mg total) by mouth 2 (two) times daily. 07/14/22   Burnell Blanks, MD  diltiazem (CARDIZEM CD) 120 MG 24 hr capsule Take 1 capsule (120 mg total) by mouth daily. 10/27/22   Burnell Blanks, MD  doxycycline (VIBRA-TABS) 100 MG tablet Take 1 tablet (100 mg total) by mouth 2 (two) times daily. 08/13/21   [name], MD  empagliflozin (JARDIANCE) 25 MG TABS tablet Take 25 mg by mouth daily.    [provider]  escitalopram (LEXAPRO) 10 MG tablet 1 tablet 04/06/22   [provider]  FLUoxetine (PROZAC) 20 MG capsule Take 20 mg by mouth daily. Patient not taking: Reported on 05/18/2022    [provider]  furosemide (LASIX) 20 MG tablet TAKE 1 TABLET DAILY. YOU   MAY TAKE AN EXTRA '20MG'$      TABLET FOR INCREASED       SWELLING OR WEIGHT GAIN 07/14/22   Burnell Blanks, MD  insulin glargine (LANTUS) 100 UNIT/ML injection Inject 38 Units into the skin 2 (two) times daily.    [provider]  mesalamine (LIALDA) 1.2 g EC tablet Take 4.8 g by mouth daily. 05/07/20   [provider]  metoprolol succinate (TOPROL XL) 50 MG 24 hr tablet TAKE 1 TABLET DAILY, WITH  OR IMMEDIATELY FOLLOWING A MEAL. 10/25/22   Burnell Blanks, MD  Multiple Vitamin (MULTIVITAMIN WITH MINERALS) TABS tablet Take 1 tablet by mouth daily.    [provider]  rosuvastatin (CRESTOR) 10 MG tablet Take 0.5 tablets (5 mg total) by mouth daily. 10/30/21   Burnell Blanks, MD  tamsulosin (FLOMAX) 0.4 MG CAPS capsule Take 1 capsule (0.4 mg  total) by mouth 2 times daily at 12 noon and 4 pm. 03/31/18   Love, Ivan Anchors, PA-C  Tiotropium Bromide-Olodaterol (STIOLTO RESPIMAT) 2.5-2.5 MCG/ACT AERS Inhale 2 puffs into the lungs daily. 10/30/21   Parrett, Fonnie Mu, NP  trimethoprim (TRIMPEX) 100 MG tablet Take 100 mg by mouth at bedtime.  08/02/18   [provider]    Family History    Family History  Problem Relation Age of Onset   Hypertension Father    He indicated that his mother is deceased. He indicated that his father is deceased. He indicated that his maternal grandmother is deceased. He indicated that his maternal grandfather is deceased. He indicated that his paternal grandmother is deceased. He indicated that his paternal grandfather is deceased.  Social History    Social History   Socioeconomic History   Marital status: Married    Spouse  name: Not on file   Number of children: Not on file   Years of education: Not on file   Highest education level: Bachelor's degree (e.g., BA, AB, BS)  Occupational History   Not on file  Tobacco Use   Smoking status: Former    Packs/day: 1.00    Years: 20.00    Total pack years: 20.00    Types: Cigarettes    Start date: 1963    Quit date: 1985    Years since quitting: 39.1   Smokeless tobacco: Never  Vaping Use   Vaping Use: Never used  Substance and Sexual Activity   Alcohol use: Yes    Alcohol/week: 4.0 standard drinks of alcohol    Types: 4 Cans of beer per week    Comment: social    Drug use: Never   Sexual activity: Not on file  Other Topics Concern   Not on file  Social History Narrative   Lives at home with his wife and two cats. Married for almost 45 years   Right handed   Caffeine: 1 cup every morning   Social Determinants of Health   Financial Resource Strain: Not on file  Food Insecurity: Not on file  Transportation Needs: Not on file  Physical Activity: Not on file  Stress: Not on file  Social Connections: Not on file  Intimate Partner  Violence: Not on file     Review of Systems    General:  No chills, fever, night sweats or weight changes.  Cardiovascular:  No chest pain, dyspnea on exertion, edema, orthopnea, palpitations, paroxysmal nocturnal dyspnea. Dermatological: No rash, lesions/masses Respiratory: No cough, dyspnea Urologic: No hematuria, dysuria Abdominal:   No nausea, vomiting, diarrhea, bright red blood per rectum, melena, or hematemesis Neurologic:  No visual changes, wkns, changes in mental status. All other systems reviewed and are otherwise negative except as noted above.  Physical Exam    VS:  BP 92/64   Pulse 74   Ht '6\' 2"'$  (1.88 m)   Wt 230 lb 9.6 oz (104.6 kg)   SpO2 91%   BMI 29.61 kg/m  , BMI Body mass index is 29.61 kg/m. GEN: Well nourished, well developed, in no acute distress. HEENT: normal. Neck: Supple, no JVD, carotid bruits, or masses. Cardiac: Irregularly irregular, no murmurs, rubs, or gallops. No clubbing, cyanosis, edema.  Radials/DP/PT 2+ and equal bilaterally.  Respiratory:  Respirations regular and unlabored, clear to auscultation bilaterally. GI: Soft, nontender, nondistended, BS + x 4. MS: no deformity or atrophy. Skin: warm and dry, no rash. Neuro:  Strength and sensation are intact. Psych: Normal affect.  Accessory Clinical Findings    Recent Labs: No results found for requested labs within last 365 days.   Recent Lipid Panel    Component Value Date/Time   CHOL 75 03/17/2018 0339   TRIG 86 08/19/2019 2150   HDL 13 (L) 03/17/2018 0339   CHOLHDL 5.8 03/17/2018 0339   VLDL 23 03/17/2018 0339   LDLCALC 39 03/17/2018 0339         ECG personally reviewed by me today-atrial fibrillation left axis deviation right bundle branch block 74 bpm-   Echocardiogram 04/24/2020  IMPRESSIONS     1. Abnormal septal motion . Left ventricular ejection fraction, by  estimation, is 55%. The left ventricle has low normal function. The left  ventricle has no regional wall  motion abnormalities. The left ventricular  internal cavity size was mildly  dilated. There is mild left ventricular hypertrophy. Left  ventricular  diastolic parameters were normal.   2. Right ventricular systolic function is normal. The right ventricular  size is normal.   3. Left atrial size was moderately dilated.   4. The mitral valve is normal in structure. Trivial mitral valve  regurgitation. No evidence of mitral stenosis.   5. The aortic valve is normal in structure. Aortic valve regurgitation is  not visualized. Mild aortic valve sclerosis is present, with no evidence  of aortic valve stenosis.   6. The inferior vena cava is normal in size with greater than 50%  respiratory variability, suggesting right atrial pressure of 3 mmHg.   FINDINGS   Left Ventricle: Abnormal septal motion. Left ventricular ejection  fraction, by estimation, is 55%. The left ventricle has low normal  function. The left ventricle has no regional wall motion abnormalities.  The left ventricular internal cavity size was  mildly dilated. There is mild left ventricular hypertrophy. Left  ventricular diastolic parameters were normal.   Right Ventricle: The right ventricular size is normal. No increase in  right ventricular wall thickness. Right ventricular systolic function is  normal.   Left Atrium: Left atrial size was moderately dilated.   Right Atrium: Right atrial size was normal in size.   Pericardium: There is no evidence of pericardial effusion.   Mitral Valve: The mitral valve is normal in structure. There is mild  thickening of the mitral valve leaflet(s). There is mild calcification of  the mitral valve leaflet(s). Normal mobility of the mitral valve leaflets.  Trivial mitral valve regurgitation.  No evidence of mitral valve stenosis.   Tricuspid Valve: The tricuspid valve is normal in structure. Tricuspid  valve regurgitation is not demonstrated. No evidence of tricuspid  stenosis.    Aortic Valve: The aortic valve is normal in structure. Aortic valve  regurgitation is not visualized. Mild aortic valve sclerosis is present,  with no evidence of aortic valve stenosis.   Pulmonic Valve: The pulmonic valve was normal in structure. Pulmonic valve  regurgitation is trivial. No evidence of pulmonic stenosis.   Aorta: The aortic root is normal in size and structure.   Venous: The inferior vena cava is normal in size with greater than 50%  respiratory variability, suggesting right atrial pressure of 3 mmHg.   IAS/Shunts: No atrial level shunt detected by color flow Doppler.    Assessment & Plan   1.  Paroxysmal atrial fibrillation-EKG today shows afib left axis deviation rbbb 74 bpm.  Reports compliance with apixaban and denies bleeding issues.  CHA2DS2-VASc score 5. Continue Cardizem, metoprolol, apixaban Avoid triggers caffeine, chocolate, EtOH, dehydration etc.  Shared Decision Making/Informed Consent The risks (stroke, cardiac arrhythmias rarely resulting in the need for a temporary or permanent pacemaker, skin irritation or burns and complications associated with conscious sedation including aspiration, arrhythmia, respiratory failure and death), benefits (restoration of normal sinus rhythm) and alternatives of a direct current cardioversion were explained in detail to Mr. Lestage and he agrees to proceed.     Essential pretension-BP today 92/64 Continue metoprolol, diltiazem Heart healthy low-sodium high-fiber diet  Chronic diastolic CHF-weight stable.  Euvolemic.  NYHA class I-2.  Echocardiogram 04/24/2020 showed LVEF of 55% and no significant valvular abnormalities. Continue current medical therapy Daily weights Order CBC, BMP  Preoperative cardiac evaluation-colonoscopy, TBD, Dr. Acie Fredrickson GI    Primary Cardiologist: Lauree Chandler, MD  Patient will not be eligible for colonoscopy until 4 weeks after DCCV.   Disposition: Follow-up with Dr.  [name] or me after DCCV.  Jossie Ng. Schwanda Zima NP-C     11/25/2022, 12:04 PM [address] Office [phone] Fax [phone]    I spent 20 minutes examining this patient, reviewing medications, and using patient centered shared decision making involving her cardiac care.  Prior to her visit I spent greater than 20 minutes reviewing her past medical history,  medications, and prior cardiac tests.

## 2022-11-23 NOTE — Progress Notes (Unsigned)
Cardiology Clinic Note   Patient Name: Kevin [name] Date of Encounter: 11/25/2022  Primary Care Provider:  [name], MD Primary Cardiologist:  Lauree Chandler, MD  Patient Profile    Kevin [name] 82 year old male presents to the clinic today for follow-up evaluation of his chronic diastolic CHF, PAF, and preoperative cardiac evaluation.  Past Medical History    Past Medical History:  Diagnosis Date   A-fib ([place]) 03/20/2018   Acute blood loss anemia    Benign essential HTN    Benign prostatic hyperplasia with urinary retention    Cerebrovascular accident (CVA) ([place])    CKD (chronic kidney disease), stage III ([place])    Diabetes ([place]) 03/20/2018   Diabetes mellitus type 2 in nonobese [facility])    Essential hypertension 03/20/2018   Former tobacco use    H/O ulcerative colitis 1993   Hypertension    Hypokalemia    Leukocytosis    New onset atrial fibrillation ([place])    Osteomyelitis ([place])    a. 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   PAF (paroxysmal atrial fibrillation) ([place])    a. dx 03/2018 in setting of stroke, osteomyelitis.   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[place])    Stroke ([place]) 03/2018   Stroke-like episode ([place]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 03/16/2018   Tachycardia 03/20/2018   Wrist fracture    Left   Past Surgical History:  Procedure Laterality Date   AMPUTATION Left 03/24/2018   Procedure: Transmetatarsal Amputation Left Foot;  Surgeon: [name], MD;  Location: [facility];  Service: Orthopedics;  Laterality: Left;   CARDIOVERSION N/A 05/05/2018   Procedure: CARDIOVERSION;  Surgeon: [name], MD;  Location: [facility] ENDOSCOPY;  Service: Cardiovascular;  Laterality: N/A;   COLONOSCOPY  12/2017   benign, remission from ulcerative colitis in 90s   I & D EXTREMITY Left 03/17/2018   Procedure: [place];  Surgeon: [name],  MD;  Location: [place];  Service: Orthopedics;  Laterality: Left;   STUMP REVISION Left 05/10/2018   Procedure: REVISION LEFT TRANSMETATARSAL AMPUTATION;  Surgeon: [name], MD;  Location: [place];  Service: Orthopedics;  Laterality: Left;    Allergies  No Known Allergies  History of Present Illness    Kevin [name] has a PMH of HTN, PAF, type 2 diabetes, chronic diastolic CHF, COVID-pneumonia, chronic respiratory failure, COPD, left distal radius fracture, BPH, UTI, CKD stage IIIb, tachycardia, HLD, amputation, CVA, and ulcerative colitis.  He was admitted in June 2019 with CVA and received tPA.  He was noted to be in atrial fibrillation during his hospitalization.  He became septic from his left foot and required transmetatarsal amputation.  He was started on apixaban.  He was converted to sinus rhythm 8/19.  His echocardiogram 6/19 showed normal LV function.  Nuclear stress testing 7/21 showed no ischemia.  His echocardiogram 7/21 showed an LVEF of 55% and no significant valvular abnormality.  He followed up with Dr. [name] 11/18/2021.  During that time he denied chest pain dyspnea and palpitations.  He denied lower extremity swelling, orthopnea and PND.  He denied near syncope and syncope.  His blood pressure was well-controlled.  Follow-up was planned for 12 months.  He presents to the clinic today for follow-up evaluation states he feels somewhat short of breath.  He has been working on being more active.  He has been using  his treadmill and reports he has been walking slow.  He is planning to travel to [facility] in April for a few weeks.  His EKG today shows atrial fibrillation left axis deviation right bundle branch block 74 bpm.  Case was reviewed with Dr. Debara Pickett and patient wishes to proceed with DCCV.  I will order CBC, BMP, schedule cardioversion and have him follow-up after the procedure.  We reviewed the importance of postponing his colonoscopy and continuing on anticoagulation for  at least 4 weeks post DCCV.  He expressed understanding.    Home Medications    Prior to Admission medications   Medication Sig Start Date End Date Taking? Authorizing Provider  albuterol (VENTOLIN HFA) 108 (90 Base) MCG/ACT inhaler Inhale 2 puffs into the lungs every 6 (six) hours as needed for wheezing or shortness of breath. 06/03/20   Brand Males, MD  apixaban (ELIQUIS) 5 MG TABS tablet Take 1 tablet (5 mg total) by mouth 2 (two) times daily. 07/14/22   Burnell Blanks, MD  diltiazem (CARDIZEM CD) 120 MG 24 hr capsule Take 1 capsule (120 mg total) by mouth daily. 10/27/22   Burnell Blanks, MD  doxycycline (VIBRA-TABS) 100 MG tablet Take 1 tablet (100 mg total) by mouth 2 (two) times daily. 08/13/21   [name], MD  empagliflozin (JARDIANCE) 25 MG TABS tablet Take 25 mg by mouth daily.    [provider]  escitalopram (LEXAPRO) 10 MG tablet 1 tablet 04/06/22   [provider]  FLUoxetine (PROZAC) 20 MG capsule Take 20 mg by mouth daily. Patient not taking: Reported on 05/18/2022    [provider]  furosemide (LASIX) 20 MG tablet TAKE 1 TABLET DAILY. YOU   MAY TAKE AN EXTRA '20MG'$      TABLET FOR INCREASED       SWELLING OR WEIGHT GAIN 07/14/22   Burnell Blanks, MD  insulin glargine (LANTUS) 100 UNIT/ML injection Inject 38 Units into the skin 2 (two) times daily.    [provider]  mesalamine (LIALDA) 1.2 g EC tablet Take 4.8 g by mouth daily. 05/07/20   [provider]  metoprolol succinate (TOPROL XL) 50 MG 24 hr tablet TAKE 1 TABLET DAILY, WITH  OR IMMEDIATELY FOLLOWING A MEAL. 10/25/22   Burnell Blanks, MD  Multiple Vitamin (MULTIVITAMIN WITH MINERALS) TABS tablet Take 1 tablet by mouth daily.    [provider]  rosuvastatin (CRESTOR) 10 MG tablet Take 0.5 tablets (5 mg total) by mouth daily. 10/30/21   Burnell Blanks, MD  tamsulosin (FLOMAX) 0.4 MG CAPS capsule Take 1 capsule (0.4 mg  total) by mouth 2 times daily at 12 noon and 4 pm. 03/31/18   Love, Ivan Anchors, PA-C  Tiotropium Bromide-Olodaterol (STIOLTO RESPIMAT) 2.5-2.5 MCG/ACT AERS Inhale 2 puffs into the lungs daily. 10/30/21   Parrett, Fonnie Mu, NP  trimethoprim (TRIMPEX) 100 MG tablet Take 100 mg by mouth at bedtime.  08/02/18   [provider]    Family History    Family History  Problem Relation Age of Onset   Hypertension Father    He indicated that his mother is deceased. He indicated that his father is deceased. He indicated that his maternal grandmother is deceased. He indicated that his maternal grandfather is deceased. He indicated that his paternal grandmother is deceased. He indicated that his paternal grandfather is deceased.  Social History    Social History   Socioeconomic History   Marital status: Married    Spouse  name: Not on file   Number of children: Not on file   Years of education: Not on file   Highest education level: Bachelor's degree (e.g., BA, AB, BS)  Occupational History   Not on file  Tobacco Use   Smoking status: Former    Packs/day: 1.00    Years: 20.00    Total pack years: 20.00    Types: Cigarettes    Start date: 1963    Quit date: 1985    Years since quitting: 39.1   Smokeless tobacco: Never  Vaping Use   Vaping Use: Never used  Substance and Sexual Activity   Alcohol use: Yes    Alcohol/week: 4.0 standard drinks of alcohol    Types: 4 Cans of beer per week    Comment: social    Drug use: Never   Sexual activity: Not on file  Other Topics Concern   Not on file  Social History Narrative   Lives at home with his wife and two cats. Married for almost 45 years   Right handed   Caffeine: 1 cup every morning   Social Determinants of Health   Financial Resource Strain: Not on file  Food Insecurity: Not on file  Transportation Needs: Not on file  Physical Activity: Not on file  Stress: Not on file  Social Connections: Not on file  Intimate Partner  Violence: Not on file     Review of Systems    General:  No chills, fever, night sweats or weight changes.  Cardiovascular:  No chest pain, dyspnea on exertion, edema, orthopnea, palpitations, paroxysmal nocturnal dyspnea. Dermatological: No rash, lesions/masses Respiratory: No cough, dyspnea Urologic: No hematuria, dysuria Abdominal:   No nausea, vomiting, diarrhea, bright red blood per rectum, melena, or hematemesis Neurologic:  No visual changes, wkns, changes in mental status. All other systems reviewed and are otherwise negative except as noted above.  Physical Exam    VS:  BP 92/64   Pulse 74   Ht '6\' 2"'$  (1.88 m)   Wt 230 lb 9.6 oz (104.6 kg)   SpO2 91%   BMI 29.61 kg/m  , BMI Body mass index is 29.61 kg/m. GEN: Well nourished, well developed, in no acute distress. HEENT: normal. Neck: Supple, no JVD, carotid bruits, or masses. Cardiac: Irregularly irregular, no murmurs, rubs, or gallops. No clubbing, cyanosis, edema.  Radials/DP/PT 2+ and equal bilaterally.  Respiratory:  Respirations regular and unlabored, clear to auscultation bilaterally. GI: Soft, nontender, nondistended, BS + x 4. MS: no deformity or atrophy. Skin: warm and dry, no rash. Neuro:  Strength and sensation are intact. Psych: Normal affect.  Accessory Clinical Findings    Recent Labs: No results found for requested labs within last 365 days.   Recent Lipid Panel    Component Value Date/Time   CHOL 75 03/17/2018 0339   TRIG 86 08/19/2019 2150   HDL 13 (L) 03/17/2018 0339   CHOLHDL 5.8 03/17/2018 0339   VLDL 23 03/17/2018 0339   LDLCALC 39 03/17/2018 0339         ECG personally reviewed by me today-atrial fibrillation left axis deviation right bundle branch block 74 bpm-   Echocardiogram 04/24/2020  IMPRESSIONS     1. Abnormal septal motion . Left ventricular ejection fraction, by  estimation, is 55%. The left ventricle has low normal function. The left  ventricle has no regional wall  motion abnormalities. The left ventricular  internal cavity size was mildly  dilated. There is mild left ventricular hypertrophy. Left  ventricular  diastolic parameters were normal.   2. Right ventricular systolic function is normal. The right ventricular  size is normal.   3. Left atrial size was moderately dilated.   4. The mitral valve is normal in structure. Trivial mitral valve  regurgitation. No evidence of mitral stenosis.   5. The aortic valve is normal in structure. Aortic valve regurgitation is  not visualized. Mild aortic valve sclerosis is present, with no evidence  of aortic valve stenosis.   6. The inferior vena cava is normal in size with greater than 50%  respiratory variability, suggesting right atrial pressure of 3 mmHg.   FINDINGS   Left Ventricle: Abnormal septal motion. Left ventricular ejection  fraction, by estimation, is 55%. The left ventricle has low normal  function. The left ventricle has no regional wall motion abnormalities.  The left ventricular internal cavity size was  mildly dilated. There is mild left ventricular hypertrophy. Left  ventricular diastolic parameters were normal.   Right Ventricle: The right ventricular size is normal. No increase in  right ventricular wall thickness. Right ventricular systolic function is  normal.   Left Atrium: Left atrial size was moderately dilated.   Right Atrium: Right atrial size was normal in size.   Pericardium: There is no evidence of pericardial effusion.   Mitral Valve: The mitral valve is normal in structure. There is mild  thickening of the mitral valve leaflet(s). There is mild calcification of  the mitral valve leaflet(s). Normal mobility of the mitral valve leaflets.  Trivial mitral valve regurgitation.  No evidence of mitral valve stenosis.   Tricuspid Valve: The tricuspid valve is normal in structure. Tricuspid  valve regurgitation is not demonstrated. No evidence of tricuspid  stenosis.    Aortic Valve: The aortic valve is normal in structure. Aortic valve  regurgitation is not visualized. Mild aortic valve sclerosis is present,  with no evidence of aortic valve stenosis.   Pulmonic Valve: The pulmonic valve was normal in structure. Pulmonic valve  regurgitation is trivial. No evidence of pulmonic stenosis.   Aorta: The aortic root is normal in size and structure.   Venous: The inferior vena cava is normal in size with greater than 50%  respiratory variability, suggesting right atrial pressure of 3 mmHg.   IAS/Shunts: No atrial level shunt detected by color flow Doppler.    Assessment & Plan   1.  Paroxysmal atrial fibrillation-EKG today shows afib left axis deviation rbbb 74 bpm.  Reports compliance with apixaban and denies bleeding issues.  CHA2DS2-VASc score 5. Continue Cardizem, metoprolol, apixaban Avoid triggers caffeine, chocolate, EtOH, dehydration etc.  Shared Decision Making/Informed Consent The risks (stroke, cardiac arrhythmias rarely resulting in the need for a temporary or permanent pacemaker, skin irritation or burns and complications associated with conscious sedation including aspiration, arrhythmia, respiratory failure and death), benefits (restoration of normal sinus rhythm) and alternatives of a direct current cardioversion were explained in detail to Kevin [name] and he agrees to proceed.     Essential pretension-BP today 92/64 Continue metoprolol, diltiazem Heart healthy low-sodium high-fiber diet  Chronic diastolic CHF-weight stable.  Euvolemic.  NYHA class I-2.  Echocardiogram 04/24/2020 showed LVEF of 55% and no significant valvular abnormalities. Continue current medical therapy Daily weights Order CBC, BMP  Preoperative cardiac evaluation-colonoscopy, TBD, Dr. Acie Fredrickson GI    Primary Cardiologist: Lauree Chandler, MD  Patient will not be eligible for colonoscopy until 4 weeks after DCCV.   Disposition: Follow-up with Dr.  [name] or me after DCCV.  Kevin [name]. Jalik Gellatly NP-C     11/25/2022, 12:04 PM [address] Office [phone] Fax [phone]    I spent 20 minutes examining this patient, reviewing medications, and using patient centered shared decision making involving her cardiac care.  Prior to her visit I spent greater than 20 minutes reviewing her past medical history,  medications, and prior cardiac tests.

## 2022-11-24 DIAGNOSIS — H353111 Nonexudative age-related macular degeneration, right eye, early dry stage: Secondary | ICD-10-CM | POA: Diagnosis not present

## 2022-11-24 DIAGNOSIS — H353122 Nonexudative age-related macular degeneration, left eye, intermediate dry stage: Secondary | ICD-10-CM | POA: Diagnosis not present

## 2022-11-24 DIAGNOSIS — H35371 Puckering of macula, right eye: Secondary | ICD-10-CM | POA: Diagnosis not present

## 2022-11-24 DIAGNOSIS — E113293 Type 2 diabetes mellitus with mild nonproliferative diabetic retinopathy without macular edema, bilateral: Secondary | ICD-10-CM | POA: Diagnosis not present

## 2022-11-25 ENCOUNTER — Ambulatory Visit: Payer: Medicare Other | Attending: General Practice | Admitting: General Practice

## 2022-11-25 ENCOUNTER — Encounter: Payer: Self-pay | Admitting: General Practice

## 2022-11-25 VITALS — BP 92/64 | HR 74 | Ht 74.0 in | Wt 230.6 lb

## 2022-11-25 DIAGNOSIS — I480 Paroxysmal atrial fibrillation: Secondary | ICD-10-CM

## 2022-11-25 DIAGNOSIS — I5032 Chronic diastolic (congestive) heart failure: Secondary | ICD-10-CM | POA: Diagnosis not present

## 2022-11-25 DIAGNOSIS — I10 Essential (primary) hypertension: Secondary | ICD-10-CM

## 2022-11-25 DIAGNOSIS — Z01818 Encounter for other preprocedural examination: Secondary | ICD-10-CM

## 2022-11-25 NOTE — Patient Instructions (Signed)
  Lab Work:TODAY CBC AND BMET  FOLLOW UP AFTER CARDIOVERSION      Dear Kevin [name]  You are scheduled for a Cardioversion on Thursday, March 7 with Dr. Harrell Gave.  Please arrive at the [facility] (Main Entrance A) at [facility]: [address] [place], [place] [address] at 8:00 AM.    DIET:  Nothing to eat or drink after midnight except a sip of water with medications (see medication instructions below)  MEDICATION INSTRUCTIONS:  Continue taking your anticoagulant (blood thinner): Apixaban (Eliquis).  You will need to continue this after your procedure until you are told by your provider that it is safe to stop.    LABS:   Come to [facility] HERE IN OUR OFFICE TODAY  FYI:  For your safety, and to allow [place] to monitor your vital signs accurately during the surgery/procedure we request: If you have artificial nails, gel coating, SNS etc, please have those removed prior to your surgery/procedure. Not having the nail coverings /polish removed may result in cancellation or delay of your surgery/procedure.  You must have a responsible person to drive you home and stay in the waiting area during your procedure. Failure to do so could result in cancellation.  Bring your insurance cards.  *Special Note: Every effort is made to have your procedure done on time. Occasionally there are emergencies that occur at the hospital that may cause delays. Please be patient if a delay does occur.

## 2022-11-26 LAB — BASIC METABOLIC PANEL
BUN/Creatinine Ratio: 16 (ref 10–24)
BUN: 32 mg/dL — ABNORMAL HIGH (ref 8–27)
CO2: 19 mmol/L — ABNORMAL LOW (ref 20–29)
Calcium: 8.7 mg/dL (ref 8.6–10.2)
Chloride: 103 mmol/L (ref 96–106)
Creatinine, Ser: 1.96 mg/dL — ABNORMAL HIGH (ref 0.76–1.27)
Glucose: 127 mg/dL — ABNORMAL HIGH (ref 70–99)
Potassium: 4.4 mmol/L (ref 3.5–5.2)
Sodium: 138 mmol/L (ref 134–144)
eGFR: 34 mL/min/{1.73_m2} — ABNORMAL LOW (ref >59)

## 2022-11-26 LAB — CBC
Hematocrit: 47.7 % (ref 37.5–51.0)
Hemoglobin: 15.5 g/dL (ref 13.0–17.7)
MCH: 29.4 pg (ref 26.6–33.0)
MCHC: 32.5 g/dL (ref 31.5–35.7)
MCV: 91 fL (ref 79–97)
Platelets: 337 10*3/uL (ref 150–450)
RBC: 5.27 x10E6/uL (ref 4.14–5.80)
RDW: 13.7 % (ref 11.6–15.4)
WBC: 11.5 10*3/uL — ABNORMAL HIGH (ref 3.4–10.8)

## 2022-12-02 ENCOUNTER — Other Ambulatory Visit: Payer: Self-pay

## 2022-12-02 ENCOUNTER — Ambulatory Visit (HOSPITAL_COMMUNITY)
Admission: RE | Admit: 2022-12-02 | Discharge: 2022-12-02 | Disposition: A | Discharge status: Home / Self Care | Payer: Medicare Other | Attending: Cardiology | Admitting: Cardiology

## 2022-12-02 ENCOUNTER — Ambulatory Visit (HOSPITAL_COMMUNITY): Payer: Medicare Other | Admitting: Certified Registered Nurse Anesthetist

## 2022-12-02 ENCOUNTER — Encounter (HOSPITAL_COMMUNITY): Payer: Self-pay | Admitting: Cardiology

## 2022-12-02 ENCOUNTER — Ambulatory Visit (HOSPITAL_BASED_OUTPATIENT_CLINIC_OR_DEPARTMENT_OTHER): Payer: Medicare Other | Admitting: Certified Registered Nurse Anesthetist

## 2022-12-02 ENCOUNTER — Encounter (HOSPITAL_COMMUNITY)
Admission: RE | Disposition: A | Discharge status: Home / Self Care | Payer: Self-pay | Source: Home / Self Care | Attending: Cardiology

## 2022-12-02 DIAGNOSIS — Z794 Long term (current) use of insulin: Secondary | ICD-10-CM | POA: Diagnosis not present

## 2022-12-02 DIAGNOSIS — I130 Hypertensive heart and chronic kidney disease with heart failure and stage 1 through stage 4 chronic kidney disease, or unspecified chronic kidney disease: Secondary | ICD-10-CM | POA: Insufficient documentation

## 2022-12-02 DIAGNOSIS — Z7901 Long term (current) use of anticoagulants: Secondary | ICD-10-CM | POA: Insufficient documentation

## 2022-12-02 DIAGNOSIS — I480 Paroxysmal atrial fibrillation: Secondary | ICD-10-CM | POA: Insufficient documentation

## 2022-12-02 DIAGNOSIS — E1151 Type 2 diabetes mellitus with diabetic peripheral angiopathy without gangrene: Secondary | ICD-10-CM

## 2022-12-02 DIAGNOSIS — I509 Heart failure, unspecified: Secondary | ICD-10-CM

## 2022-12-02 DIAGNOSIS — I4510 Unspecified right bundle-branch block: Secondary | ICD-10-CM | POA: Diagnosis not present

## 2022-12-02 DIAGNOSIS — J449 Chronic obstructive pulmonary disease, unspecified: Secondary | ICD-10-CM | POA: Insufficient documentation

## 2022-12-02 DIAGNOSIS — Z87891 Personal history of nicotine dependence: Secondary | ICD-10-CM | POA: Insufficient documentation

## 2022-12-02 DIAGNOSIS — Z79899 Other long term (current) drug therapy: Secondary | ICD-10-CM | POA: Diagnosis not present

## 2022-12-02 DIAGNOSIS — I4891 Unspecified atrial fibrillation: Secondary | ICD-10-CM | POA: Diagnosis not present

## 2022-12-02 DIAGNOSIS — N1832 Chronic kidney disease, stage 3b: Secondary | ICD-10-CM | POA: Insufficient documentation

## 2022-12-02 DIAGNOSIS — E785 Hyperlipidemia, unspecified: Secondary | ICD-10-CM | POA: Diagnosis not present

## 2022-12-02 DIAGNOSIS — N189 Chronic kidney disease, unspecified: Secondary | ICD-10-CM | POA: Diagnosis not present

## 2022-12-02 DIAGNOSIS — I5032 Chronic diastolic (congestive) heart failure: Secondary | ICD-10-CM | POA: Insufficient documentation

## 2022-12-02 DIAGNOSIS — Z7984 Long term (current) use of oral hypoglycemic drugs: Secondary | ICD-10-CM | POA: Diagnosis not present

## 2022-12-02 DIAGNOSIS — D631 Anemia in chronic kidney disease: Secondary | ICD-10-CM

## 2022-12-02 DIAGNOSIS — E1122 Type 2 diabetes mellitus with diabetic chronic kidney disease: Secondary | ICD-10-CM

## 2022-12-02 HISTORY — PX: CARDIOVERSION: SHX1299

## 2022-12-02 LAB — GLUCOSE, CAPILLARY: Glucose-Capillary: 98 mg/dL (ref 70–99)

## 2022-12-02 SURGERY — CARDIOVERSION
Anesthesia: General

## 2022-12-02 MED ORDER — SODIUM CHLORIDE 0.9 % IV SOLN: INTRAVENOUS | Status: DC

## 2022-12-02 MED ORDER — METOPROLOL SUCCINATE ER 50 MG PO TB24: ORAL_TABLET | ORAL | 1 refills | Status: DC

## 2022-12-02 MED ORDER — LIDOCAINE 2% (20 MG/ML) 5 ML SYRINGE
INTRAMUSCULAR | Status: DC | PRN
  Administered 2022-12-02: 100 mg via INTRAVENOUS

## 2022-12-02 MED ORDER — PROPOFOL 10 MG/ML IV BOLUS
INTRAVENOUS | Status: DC | PRN
  Administered 2022-12-02: 60 mg via INTRAVENOUS
  Administered 2022-12-02: 30 mg via INTRAVENOUS

## 2022-12-02 MED ORDER — APIXABAN 5 MG PO TABS
5.0000 mg | ORAL_TABLET | ORAL | Status: AC
  Administered 2022-12-02: 5 mg via ORAL
  Filled 2022-12-02: qty 1

## 2022-12-02 NOTE — Interval H&P Note (Signed)
History and Physical Interval Note:  12/02/2022 8:37 AM  Kevin [name] Kevin [name]  has presented today for surgery, with the diagnosis of AFIB.  The various methods of treatment have been discussed with the patient and family. After consideration of risks, benefits and other options for treatment, the patient has consented to  Procedure(s): CARDIOVERSION (N/A) as a surgical intervention.  The patient's history has been reviewed, patient examined, no change in status, stable for surgery.  I have reviewed the patient's chart and labs.  Questions were answered to the patient's satisfaction.     Arend Bahl Harrell Gave

## 2022-12-02 NOTE — Discharge Instructions (Addendum)
hold metoprolol temporarily until cardiology follow up or heart rates consistently >70 bpm.Electrical Cardioversion Electrical cardioversion is the delivery of a jolt of electricity to restore a normal rhythm to the heart. A rhythm that is too fast or is not regular keeps the heart from pumping well. In this procedure, sticky patches or metal paddles are placed on the chest to deliver electricity to the heart from a device. This procedure may be done in an emergency if: There is low or no blood pressure as a result of the heart rhythm. Normal rhythm must be restored as fast as possible to protect the brain and heart from further damage. It may save a life. This may also be a scheduled procedure for irregular or fast heart rhythms that are not immediately life-threatening.  What can I expect after the procedure? Your blood pressure, heart rate, breathing rate, and blood oxygen level will be monitored until you leave the hospital or clinic. Your heart rhythm will be watched to make sure it does not change. You may have some redness on the skin where the shocks were given. Over the counter cortizone cream may be helpful.  Follow these instructions at home: Do not drive for 24 hours if you were given a sedative during your procedure. Take over-the-counter and prescription medicines only as told by your health care provider. Ask your health care provider how to check your pulse. Check it often. Rest for 48 hours after the procedure or as told by your health care provider. Avoid or limit your caffeine use as told by your health care provider. Keep all follow-up visits as told by your health care provider. This is important. Contact a health care provider if: You feel like your heart is beating too quickly or your pulse is not regular. You have a serious muscle cramp that does not go away. Get help right away if: You have discomfort in your chest. You are dizzy or you feel faint. You have trouble  breathing or you are short of breath. Your speech is slurred. You have trouble moving an arm or leg on one side of your body. Your fingers or toes turn cold or blue. Summary Electrical cardioversion is the delivery of a jolt of electricity to restore a normal rhythm to the heart. This procedure may be done right away in an emergency or may be a scheduled procedure if the condition is not an emergency. Generally, this is a safe procedure. After the procedure, check your pulse often as told by your health care provider. This information is not intended to replace advice given to you by your health care provider. Make sure you discuss any questions you have with your health care provider. Document Revised: 04/16/2019 Document Reviewed: 04/16/2019 Elsevier Patient Education  [place].

## 2022-12-02 NOTE — Anesthesia Postprocedure Evaluation (Signed)
Anesthesia Post Note  Patient: Kevin [name]  Procedure(s) Performed: CARDIOVERSION     Patient location during evaluation: PACU Anesthesia Type: General Level of consciousness: awake and alert Pain management: pain level controlled Vital Signs Assessment: post-procedure vital signs reviewed and stable Respiratory status: spontaneous breathing, nonlabored ventilation, respiratory function stable and patient connected to nasal cannula oxygen Cardiovascular status: blood pressure returned to baseline and stable Postop Assessment: no apparent nausea or vomiting Anesthetic complications: no   No notable events documented.  Last Vitals:  Vitals:   12/02/22 0930 12/02/22 0935  BP: (!) 113/55 109/63  Pulse: (!) 49 (!) 49  Resp: 14 15  Temp:    SpO2: 93% 94%    Last Pain:  Vitals:   12/02/22 0935  TempSrc:   PainSc: 0-No pain                 Dhilan Brauer

## 2022-12-02 NOTE — Anesthesia Preprocedure Evaluation (Signed)
Anesthesia Evaluation  Patient identified by MRN, date of birth, ID band Patient awake    Reviewed: Allergy & Precautions, NPO status , Patient's Chart, lab work & pertinent test results  Airway Mallampati: II  TM Distance: >3 FB Neck ROM: Full    Dental  (+) Dental Advisory Given, Poor Dentition, Missing, Chipped   Pulmonary former smoker   breath sounds clear to auscultation       Cardiovascular hypertension, Pt. on medications and Pt. on home beta blockers + Peripheral Vascular Disease and +CHF   Rhythm:Regular Rate:Normal     Neuro/Psych  Neuromuscular disease CVA    GI/Hepatic negative GI ROS, Neg liver ROS,,,  Endo/Other  diabetes    Renal/GU CRFRenal disease     Musculoskeletal   Abdominal   Peds  Hematology  (+) Blood dyscrasia, anemia   Anesthesia Other Findings   Reproductive/Obstetrics                              Lab Results  Component Value Date   WBC 11.5 (H) 11/25/2022   HGB 15.5 11/25/2022   HCT 47.7 11/25/2022   MCV 91 11/25/2022   PLT 337 11/25/2022   Lab Results  Component Value Date   CREATININE 1.96 (H) 11/25/2022   BUN 32 (H) 11/25/2022   NA 138 11/25/2022   K 4.4 11/25/2022   CL 103 11/25/2022   CO2 19 (L) 11/25/2022    Anesthesia Physical Anesthesia Plan  ASA: 3  Anesthesia Plan: General   Post-op Pain Management: Minimal or no pain anticipated   Induction: Intravenous  PONV Risk Score and Plan: 2 and Treatment may vary due to age or medical condition and Propofol infusion  Airway Management Planned: Natural Airway and Simple Face Mask  Additional Equipment: None  Intra-op Plan:   Post-operative Plan:   Informed Consent: I have reviewed the patients History and Physical, chart, labs and discussed the procedure including the risks, benefits and alternatives for the proposed anesthesia with the patient or authorized representative who has  indicated his/her understanding and acceptance.     Dental advisory given  Plan Discussed with: CRNA and Anesthesiologist  Anesthesia Plan Comments:          Anesthesia Quick Evaluation

## 2022-12-02 NOTE — Transfer of Care (Signed)
Immediate Anesthesia Transfer of Care Note  Patient: Kevin [name]  Procedure(s) Performed: CARDIOVERSION  Patient Location: Endoscopy Unit  Anesthesia Type:General  Level of Consciousness: drowsy and patient cooperative  Airway & Oxygen Therapy: Patient Spontanous Breathing  Post-op Assessment: Report given to RN, Post -op Vital signs reviewed and stable, and Patient moving all extremities X 4  Post vital signs: Reviewed and stable  Last Vitals:  Vitals Value Taken Time  BP 104/58   Temp    Pulse 68   Resp 25   SpO2 94     Last Pain:  Vitals:   12/02/22 0800  TempSrc: Temporal  PainSc: 0-No pain         Complications: No notable events documented.

## 2022-12-02 NOTE — CV Procedure (Signed)
Procedure:   DCCV  Indication:  Symptomatic atrial fibrillation  Procedure Note:  The patient signed informed consent.  They have had had therapeutic anticoagulation with apixaban greater than 3 weeks.  Anesthesia was administered by Dr. Ambrose Pancoast.  Patient received 100 mg IV lidocaine and 90 mg IV propofol.Adequate airway was maintained throughout and vital followed per protocol.  They were cardioverted x 2 with 150, 200J of biphasic synchronized energy with anterior pressure.  They converted to sinus bradycardia.  There were no apparent complications.  The patient had normal neuro status and respiratory status post procedure with vitals stable as recorded elsewhere.    Follow up:  They will continue on current medical therapy and follow up with cardiology as scheduled. I will have him hold metoprolol temporarily until follow up or heart rates consistently >70 bpm.  Buford Dresser, MD PhD 12/02/2022 8:57 AM

## 2022-12-06 ENCOUNTER — Encounter (HOSPITAL_COMMUNITY): Payer: Self-pay | Admitting: Cardiology

## 2022-12-07 ENCOUNTER — Other Ambulatory Visit: Payer: Self-pay

## 2022-12-07 ENCOUNTER — Inpatient Hospital Stay (HOSPITAL_COMMUNITY)
Admission: EM | Admit: 2022-12-07 | Discharge: 2022-12-13 | DRG: 189 | Disposition: A | Discharge status: Home / Self Care | Payer: Medicare Other | Attending: Student | Admitting: Student

## 2022-12-07 ENCOUNTER — Telehealth: Payer: Self-pay | Admitting: Physician Assistant

## 2022-12-07 ENCOUNTER — Emergency Department (HOSPITAL_COMMUNITY): Payer: Medicare Other

## 2022-12-07 ENCOUNTER — Encounter (HOSPITAL_COMMUNITY): Payer: Self-pay

## 2022-12-07 DIAGNOSIS — I130 Hypertensive heart and chronic kidney disease with heart failure and stage 1 through stage 4 chronic kidney disease, or unspecified chronic kidney disease: Secondary | ICD-10-CM | POA: Diagnosis present

## 2022-12-07 DIAGNOSIS — J449 Chronic obstructive pulmonary disease, unspecified: Secondary | ICD-10-CM | POA: Diagnosis present

## 2022-12-07 DIAGNOSIS — N1832 Chronic kidney disease, stage 3b: Secondary | ICD-10-CM | POA: Diagnosis not present

## 2022-12-07 DIAGNOSIS — E785 Hyperlipidemia, unspecified: Secondary | ICD-10-CM | POA: Diagnosis present

## 2022-12-07 DIAGNOSIS — Z8711 Personal history of peptic ulcer disease: Secondary | ICD-10-CM

## 2022-12-07 DIAGNOSIS — M86172 Other acute osteomyelitis, left ankle and foot: Secondary | ICD-10-CM | POA: Diagnosis present

## 2022-12-07 DIAGNOSIS — E11621 Type 2 diabetes mellitus with foot ulcer: Secondary | ICD-10-CM | POA: Diagnosis present

## 2022-12-07 DIAGNOSIS — C3490 Malignant neoplasm of unspecified part of unspecified bronchus or lung: Secondary | ICD-10-CM | POA: Diagnosis present

## 2022-12-07 DIAGNOSIS — J439 Emphysema, unspecified: Secondary | ICD-10-CM | POA: Diagnosis not present

## 2022-12-07 DIAGNOSIS — Z79899 Other long term (current) drug therapy: Secondary | ICD-10-CM

## 2022-12-07 DIAGNOSIS — R0902 Hypoxemia: Secondary | ICD-10-CM

## 2022-12-07 DIAGNOSIS — L97521 Non-pressure chronic ulcer of other part of left foot limited to breakdown of skin: Secondary | ICD-10-CM | POA: Diagnosis not present

## 2022-12-07 DIAGNOSIS — Z87891 Personal history of nicotine dependence: Secondary | ICD-10-CM

## 2022-12-07 DIAGNOSIS — I5032 Chronic diastolic (congestive) heart failure: Secondary | ICD-10-CM | POA: Diagnosis not present

## 2022-12-07 DIAGNOSIS — R509 Fever, unspecified: Secondary | ICD-10-CM | POA: Diagnosis not present

## 2022-12-07 DIAGNOSIS — Z8616 Personal history of COVID-19: Secondary | ICD-10-CM

## 2022-12-07 DIAGNOSIS — I4891 Unspecified atrial fibrillation: Secondary | ICD-10-CM | POA: Diagnosis not present

## 2022-12-07 DIAGNOSIS — E119 Type 2 diabetes mellitus without complications: Secondary | ICD-10-CM

## 2022-12-07 DIAGNOSIS — E1151 Type 2 diabetes mellitus with diabetic peripheral angiopathy without gangrene: Secondary | ICD-10-CM | POA: Diagnosis present

## 2022-12-07 DIAGNOSIS — D649 Anemia, unspecified: Secondary | ICD-10-CM | POA: Diagnosis not present

## 2022-12-07 DIAGNOSIS — Z8249 Family history of ischemic heart disease and other diseases of the circulatory system: Secondary | ICD-10-CM

## 2022-12-07 DIAGNOSIS — N3289 Other specified disorders of bladder: Secondary | ICD-10-CM | POA: Diagnosis not present

## 2022-12-07 DIAGNOSIS — R338 Other retention of urine: Secondary | ICD-10-CM | POA: Diagnosis not present

## 2022-12-07 DIAGNOSIS — E1122 Type 2 diabetes mellitus with diabetic chronic kidney disease: Secondary | ICD-10-CM | POA: Diagnosis present

## 2022-12-07 DIAGNOSIS — N319 Neuromuscular dysfunction of bladder, unspecified: Secondary | ICD-10-CM | POA: Diagnosis not present

## 2022-12-07 DIAGNOSIS — N1330 Unspecified hydronephrosis: Secondary | ICD-10-CM | POA: Diagnosis not present

## 2022-12-07 DIAGNOSIS — I2510 Atherosclerotic heart disease of native coronary artery without angina pectoris: Secondary | ICD-10-CM | POA: Diagnosis present

## 2022-12-07 DIAGNOSIS — I639 Cerebral infarction, unspecified: Secondary | ICD-10-CM | POA: Diagnosis present

## 2022-12-07 DIAGNOSIS — D3501 Benign neoplasm of right adrenal gland: Secondary | ICD-10-CM | POA: Diagnosis present

## 2022-12-07 DIAGNOSIS — K5190 Ulcerative colitis, unspecified, without complications: Secondary | ICD-10-CM | POA: Diagnosis present

## 2022-12-07 DIAGNOSIS — E1169 Type 2 diabetes mellitus with other specified complication: Secondary | ICD-10-CM | POA: Diagnosis present

## 2022-12-07 DIAGNOSIS — Z89432 Acquired absence of left foot: Secondary | ICD-10-CM

## 2022-12-07 DIAGNOSIS — R0602 Shortness of breath: Principal | ICD-10-CM

## 2022-12-07 DIAGNOSIS — J9621 Acute and chronic respiratory failure with hypoxia: Secondary | ICD-10-CM | POA: Diagnosis not present

## 2022-12-07 DIAGNOSIS — E1165 Type 2 diabetes mellitus with hyperglycemia: Secondary | ICD-10-CM | POA: Diagnosis present

## 2022-12-07 DIAGNOSIS — L97529 Non-pressure chronic ulcer of other part of left foot with unspecified severity: Secondary | ICD-10-CM | POA: Diagnosis present

## 2022-12-07 DIAGNOSIS — Z7951 Long term (current) use of inhaled steroids: Secondary | ICD-10-CM

## 2022-12-07 DIAGNOSIS — I4819 Other persistent atrial fibrillation: Secondary | ICD-10-CM | POA: Diagnosis not present

## 2022-12-07 DIAGNOSIS — Z1152 Encounter for screening for COVID-19: Secondary | ICD-10-CM

## 2022-12-07 DIAGNOSIS — Z66 Do not resuscitate: Secondary | ICD-10-CM | POA: Diagnosis not present

## 2022-12-07 DIAGNOSIS — Z7901 Long term (current) use of anticoagulants: Secondary | ICD-10-CM

## 2022-12-07 DIAGNOSIS — R918 Other nonspecific abnormal finding of lung field: Secondary | ICD-10-CM | POA: Diagnosis present

## 2022-12-07 DIAGNOSIS — Z794 Long term (current) use of insulin: Secondary | ICD-10-CM

## 2022-12-07 DIAGNOSIS — I10 Essential (primary) hypertension: Secondary | ICD-10-CM | POA: Diagnosis not present

## 2022-12-07 DIAGNOSIS — J9601 Acute respiratory failure with hypoxia: Secondary | ICD-10-CM

## 2022-12-07 DIAGNOSIS — J8410 Pulmonary fibrosis, unspecified: Secondary | ICD-10-CM | POA: Diagnosis present

## 2022-12-07 DIAGNOSIS — M19072 Primary osteoarthritis, left ankle and foot: Secondary | ICD-10-CM | POA: Diagnosis present

## 2022-12-07 DIAGNOSIS — Z8701 Personal history of pneumonia (recurrent): Secondary | ICD-10-CM

## 2022-12-07 DIAGNOSIS — E871 Hypo-osmolality and hyponatremia: Secondary | ICD-10-CM | POA: Diagnosis not present

## 2022-12-07 DIAGNOSIS — T502X5A Adverse effect of carbonic-anhydrase inhibitors, benzothiadiazides and other diuretics, initial encounter: Secondary | ICD-10-CM | POA: Diagnosis not present

## 2022-12-07 DIAGNOSIS — N179 Acute kidney failure, unspecified: Secondary | ICD-10-CM | POA: Diagnosis not present

## 2022-12-07 DIAGNOSIS — N136 Pyonephrosis: Secondary | ICD-10-CM | POA: Diagnosis present

## 2022-12-07 DIAGNOSIS — I2723 Pulmonary hypertension due to lung diseases and hypoxia: Secondary | ICD-10-CM | POA: Diagnosis present

## 2022-12-07 DIAGNOSIS — N401 Enlarged prostate with lower urinary tract symptoms: Secondary | ICD-10-CM | POA: Diagnosis present

## 2022-12-07 DIAGNOSIS — I5033 Acute on chronic diastolic (congestive) heart failure: Secondary | ICD-10-CM | POA: Diagnosis not present

## 2022-12-07 DIAGNOSIS — Z87440 Personal history of urinary (tract) infections: Secondary | ICD-10-CM | POA: Diagnosis not present

## 2022-12-07 DIAGNOSIS — I480 Paroxysmal atrial fibrillation: Secondary | ICD-10-CM | POA: Diagnosis present

## 2022-12-07 DIAGNOSIS — R002 Palpitations: Secondary | ICD-10-CM | POA: Diagnosis not present

## 2022-12-07 DIAGNOSIS — Z7984 Long term (current) use of oral hypoglycemic drugs: Secondary | ICD-10-CM

## 2022-12-07 DIAGNOSIS — K219 Gastro-esophageal reflux disease without esophagitis: Secondary | ICD-10-CM | POA: Diagnosis present

## 2022-12-07 DIAGNOSIS — Z85118 Personal history of other malignant neoplasm of bronchus and lung: Secondary | ICD-10-CM

## 2022-12-07 DIAGNOSIS — Z8673 Personal history of transient ischemic attack (TIA), and cerebral infarction without residual deficits: Secondary | ICD-10-CM

## 2022-12-07 DIAGNOSIS — M869 Osteomyelitis, unspecified: Secondary | ICD-10-CM | POA: Diagnosis present

## 2022-12-07 LAB — CBC WITH DIFFERENTIAL/PLATELET
Abs Immature Granulocytes: 0.02 10*3/uL (ref 0.00–0.07)
Basophils Absolute: 0.1 10*3/uL (ref 0.0–0.1)
Basophils Relative: 1 %
Eosinophils Absolute: 0.1 10*3/uL (ref 0.0–0.5)
Eosinophils Relative: 1 %
HCT: 46.3 % (ref 39.0–52.0)
Hemoglobin: 15.2 g/dL (ref 13.0–17.0)
Immature Granulocytes: 0 %
Lymphocytes Relative: 20 %
Lymphs Abs: 1.8 10*3/uL (ref 0.7–4.0)
MCH: 29.1 pg (ref 26.0–34.0)
MCHC: 32.8 g/dL (ref 30.0–36.0)
MCV: 88.5 fL (ref 80.0–100.0)
Monocytes Absolute: 1.2 10*3/uL — ABNORMAL HIGH (ref 0.1–1.0)
Monocytes Relative: 14 %
Neutro Abs: 5.7 10*3/uL (ref 1.7–7.7)
Neutrophils Relative %: 64 %
Platelets: 332 10*3/uL (ref 150–400)
RBC: 5.23 MIL/uL (ref 4.22–5.81)
RDW: 15.1 % (ref 11.5–15.5)
WBC: 8.8 10*3/uL (ref 4.0–10.5)
nRBC: 0 % (ref 0.0–0.2)

## 2022-12-07 LAB — BASIC METABOLIC PANEL
Anion gap: 14 (ref 5–15)
BUN: 23 mg/dL (ref 8–23)
CO2: 19 mmol/L — ABNORMAL LOW (ref 22–32)
Calcium: 8.9 mg/dL (ref 8.9–10.3)
Chloride: 97 mmol/L — ABNORMAL LOW (ref 98–111)
Creatinine, Ser: 1.94 mg/dL — ABNORMAL HIGH (ref 0.61–1.24)
GFR, Estimated: 35 mL/min — ABNORMAL LOW (ref >60)
Glucose, Bld: 153 mg/dL — ABNORMAL HIGH (ref 70–99)
Potassium: 4.7 mmol/L (ref 3.5–5.1)
Sodium: 130 mmol/L — ABNORMAL LOW (ref 135–145)

## 2022-12-07 LAB — BRAIN NATRIURETIC PEPTIDE: B Natriuretic Peptide: 475.5 pg/mL — ABNORMAL HIGH (ref 0.0–100.0)

## 2022-12-07 LAB — TROPONIN I (HIGH SENSITIVITY): Troponin I (High Sensitivity): 20 ng/L — ABNORMAL HIGH (ref <18)

## 2022-12-07 MED ORDER — ALBUTEROL SULFATE (2.5 MG/3ML) 0.083% IN NEBU
5.0000 mg | INHALATION_SOLUTION | Freq: Once | RESPIRATORY_TRACT | Status: AC
  Administered 2022-12-07: 5 mg via RESPIRATORY_TRACT
  Filled 2022-12-07: qty 6

## 2022-12-07 MED ORDER — FUROSEMIDE 10 MG/ML IJ SOLN
40.0000 mg | Freq: Once | INTRAMUSCULAR | Status: AC
  Administered 2022-12-07: 40 mg via INTRAVENOUS
  Filled 2022-12-07: qty 4

## 2022-12-07 NOTE — ED Triage Notes (Signed)
Pt BIB [facility] from home with c/o of a high heart rate. Pt initally called PA for medication questions but was recommended to come here. Pt does have a hx of A-fib and COPD. Pt was just here a week ago and had to be cardioverted.   EMS VS P 136 initially 136.... afterwards 82 R 25-30 BP 136/82 O2 94% 2L [place] CBG 136

## 2022-12-07 NOTE — ED Provider Notes (Signed)
[facility] Provider Note   CSN: [id number] Arrival date & time: 12/07/22  2124     History  Chief Complaint  Patient presents with   Tachycardia    Kevin [name] is a 82 y.o. male.  Patient is a 82 year old male who presents with fast heart rates.  He has a history of CHF, diabetes, hypertension, prior COVID infection and paroxysmal atrial fibrillation on Eliquis.  He was cardioverted in 2019 and most recently was cardioverted on March 7.  He says that over the last 2 to 3 days he has noticed that his heart rates are intermittently faster than normal.  Today he noted that it was up into the 120s at times.  He was concerned that he may have gone back into the A-fib.  He has had some increased shortness of breath over the last few days as well.  He gets short of breath walking short distances.  He denies any increased leg swelling.  He has had a little bit of increased coughing.  No runny nose or congestion.  No fevers.  No new urinary symptoms.  He does have oxygen at home that he wears at times but only when he needs it.  He uses 3 L/min.       Home Medications Prior to Admission medications   Medication Sig Start Date End Date Taking? Authorizing Provider  albuterol (VENTOLIN HFA) 108 (90 Base) MCG/ACT inhaler Inhale 2 puffs into the lungs every 6 (six) hours as needed for wheezing or shortness of breath. 06/03/20   Brand Males, MD  apixaban (ELIQUIS) 5 MG TABS tablet Take 1 tablet (5 mg total) by mouth 2 (two) times daily. 07/14/22   Burnell Blanks, MD  diltiazem (CARDIZEM CD) 120 MG 24 hr capsule Take 1 capsule (120 mg total) by mouth daily. 10/27/22   Burnell Blanks, MD  empagliflozin (JARDIANCE) 10 MG TABS tablet Take 10 mg by mouth daily.    [provider]  escitalopram (LEXAPRO) 10 MG tablet Take 10 mg by mouth in the morning. 04/06/22   [provider]  fluconazole (DIFLUCAN) 200 MG  tablet Take 200 mg by mouth daily. 11/25/22   [provider]  furosemide (LASIX) 20 MG tablet TAKE 1 TABLET DAILY. YOU   MAY TAKE AN EXTRA '20MG'$      TABLET FOR INCREASED       SWELLING OR WEIGHT GAIN 07/14/22   Burnell Blanks, MD  insulin glargine (LANTUS) 100 UNIT/ML injection Inject 20-38 Units into the skin 2 (two) times daily.    [provider]  loratadine (CLARITIN) 10 MG tablet Take 10 mg by mouth at bedtime.    [provider]  mesalamine (LIALDA) 1.2 g EC tablet Take 1.2 g by mouth 2 (two) times daily. 05/07/20   [provider]  metoprolol succinate (TOPROL XL) 50 MG 24 hr tablet Hold this medication until you have follow up with your cardiology provider. 12/02/22   Buford Dresser, MD  Multiple Vitamin (MULTIVITAMIN WITH MINERALS) TABS tablet Take 1 tablet by mouth daily.    [provider]  Multiple Vitamins-Minerals (PRESERVISION AREDS 2 PO) Take 1 tablet by mouth daily.    [provider]  oxybutynin (DITROPAN) 5 MG tablet Take 5 mg by mouth at bedtime.    [provider]  rosuvastatin (CRESTOR) 10 MG tablet Take 0.5 tablets (5 mg total) by mouth daily. 10/30/21   Burnell Blanks, MD  tamsulosin (  FLOMAX) 0.4 MG CAPS capsule Take 1 capsule (0.4 mg total) by mouth 2 times daily at 12 noon and 4 pm. 03/31/18   Love, Ivan Anchors, PA-C  Tiotropium Bromide-Olodaterol (STIOLTO RESPIMAT) 2.5-2.5 MCG/ACT AERS Inhale 2 puffs into the lungs daily. 10/30/21   Parrett, Fonnie Mu, NP  trimethoprim (TRIMPEX) 100 MG tablet Take 100 mg by mouth at bedtime.  08/02/18   [provider]      Allergies    Patient has no known allergies.    Review of Systems   Review of Systems  Constitutional:  Positive for fatigue. Negative for chills, diaphoresis and fever.  HENT:  Negative for congestion, rhinorrhea and sneezing.   Eyes: Negative.   Respiratory:  Positive for cough and shortness of breath. Negative for chest tightness.    Cardiovascular:  Positive for palpitations. Negative for chest pain and leg swelling.  Gastrointestinal:  Negative for abdominal pain, blood in stool, diarrhea, nausea and vomiting.  Genitourinary:  Negative for difficulty urinating, flank pain, frequency and hematuria.  Musculoskeletal:  Negative for arthralgias and back pain.  Skin:  Negative for rash.  Neurological:  Negative for dizziness, speech difficulty, weakness, numbness and headaches.    Physical Exam Updated Vital Signs BP (!) 112/95   Pulse 73   Temp 98.1 F (36.7 C) (Oral)   Resp (!) 22   Ht '6\' 2"'$  (1.88 m)   Wt 99.8 kg   SpO2 92%   BMI 28.25 kg/m  Physical Exam Constitutional:      Appearance: He is well-developed.  HENT:     Head: Normocephalic and atraumatic.  Eyes:     Pupils: Pupils are equal, round, and reactive to light.  Cardiovascular:     Rate and Rhythm: Normal rate and regular rhythm.     Heart sounds: Normal heart sounds.  Pulmonary:     Effort: Pulmonary effort is normal. No respiratory distress.     Breath sounds: Normal breath sounds. No wheezing or rales.     Comments: Few crackles in the bases bilaterally Chest:     Chest wall: No tenderness.  Abdominal:     General: Bowel sounds are normal.     Palpations: Abdomen is soft.     Tenderness: There is no abdominal tenderness. There is no guarding or rebound.  Musculoskeletal:        General: Normal range of motion.     Cervical back: Normal range of motion and neck supple.     Comments: Trace edema to lower extremities bilaterally, no calf tenderness  Lymphadenopathy:     Cervical: No cervical adenopathy.  Skin:    General: Skin is warm and dry.     Findings: No rash.  Neurological:     Mental Status: He is alert and oriented to person, place, and time.     ED Results / Procedures / Treatments   Labs (all labs ordered are listed, but only abnormal results are displayed) Labs Reviewed  BASIC METABOLIC PANEL - Abnormal; Notable  for the following components:      Result Value   Sodium 130 (*)    Chloride 97 (*)    CO2 19 (*)    Glucose, Bld 153 (*)    Creatinine, Ser 1.94 (*)    GFR, Estimated 35 (*)    All other components within normal limits  CBC WITH DIFFERENTIAL/PLATELET - Abnormal; Notable for the following components:   Monocytes Absolute 1.2 (*)    All other components within normal  limits  BRAIN NATRIURETIC PEPTIDE - Abnormal; Notable for the following components:   B Natriuretic Peptide 475.5 (*)    All other components within normal limits  TROPONIN I (HIGH SENSITIVITY) - Abnormal; Notable for the following components:   Troponin I (High Sensitivity) 20 (*)    All other components within normal limits  TROPONIN I (HIGH SENSITIVITY)    EKG EKG Interpretation  Date/Time:  Tuesday December 07 2022 21:31:39 EDT Ventricular Rate:  81 PR Interval:  200 QRS Duration: 146 QT Interval:  416 QTC Calculation: 483 R Axis:   237 Text Interpretation: Sinus or ectopic atrial rhythm Right bundle branch block since last tracing no significant change Confirmed by Malvin Johns [phone]) on 12/07/2022 9:35:28 PM  Radiology DG Chest Port 1 View  Result Date: 12/07/2022 CLINICAL DATA:  Shortness of breath EXAM: PORTABLE CHEST 1 VIEW COMPARISON:  Chest x-ray 08/21/2019 FINDINGS: The heart size and mediastinal contours are within normal limits. There is a focal opacity in the right upper lobe which is slightly linear. The lungs are otherwise clear. There is no pleural effusion or pneumothorax. The visualized skeletal structures are unremarkable. IMPRESSION: Focal opacity in the right upper lobe which is slightly linear. This could be due to atelectasis or scarring. Recommend follow-up chest x-ray in 4-6 weeks to ensure resolution. Electronically Signed   By: Ronney Asters M.D.   On: 12/07/2022 22:07    Procedures Procedures    Medications Ordered in ED Medications  albuterol (PROVENTIL) (2.5 MG/3ML) 0.083%  nebulizer solution 5 mg (5 mg Nebulization Given 12/07/22 2317)  furosemide (LASIX) injection 40 mg (40 mg Intravenous Given 12/07/22 2318)    ED Course/ Medical Decision Making/ A&P                             Medical Decision Making Amount and/or Complexity of Data Reviewed Labs: ordered. Radiology: ordered.  Risk Prescription drug management. Decision regarding hospitalization.   Patient is a 82 year old male who presents with feeling like his heart is beating fast and also some increased shortness of breath over the last few days.  No significant leg swelling.  No fevers.  He has a little bit of coughing.  EKG shows a sinus rhythm.  He had a normal rate.  No evidence of atrial fibrillation or other arrhythmias.  He has noted to be a bit hypoxic with oxygen saturations around 89 to 90% on oxygen at 2 L/min.  It was increased to 3 L/min.  His chest x-ray shows no evidence of pneumonia.  No significant pulmonary edema.  There is a linear lucency in his right upper lung.  Possibly atelectasis.  Will need follow-up chest x-ray as an outpatient to ensure resolution.  This was interpreted by me and confirmed by the radiologist.  His EKG does not show any ischemic changes.  His other labs are nonconcerning other than his BNP was elevated.  His troponin is also mildly elevated.  He does deny any chest pain or tightness.  I suspect he has a component of CHF and potentially some COPD exacerbation.  I do not really appreciate any wheezing but he does have some crackles on exam.  Will give some Lasix and also an albuterol neb.  I spoke with Dr. Humphrey Rolls who is the cardiologist on-call tonight.  He did recommend admission to the hospitalist service and cardiology will follow.  I spoke with Dr. Flossie Buffy who will admit the pt.  Final  Clinical Impression(s) / ED Diagnoses Final diagnoses:  Shortness of breath  Hypoxia    Rx / DC Orders ED Discharge Orders     None         Malvin Johns, MD 12/07/22  2335

## 2022-12-07 NOTE — H&P (Signed)
History and Physical    Patient: Kevin [name] [id number] DOB: Aug 14, 1941 DOA: 12/07/2022 DOS: the patient was seen and examined on 12/08/2022 PCP: [name], MD  Patient coming from: Home  Chief Complaint:  Chief Complaint  Patient presents with   Tachycardia   HPI: Kevin [name] is a 82 y.o. male with medical history significant of hypertension, HFpEF, paroxysmal atrial fibrillation on Eliquis, type 2 diabetes, CVA, CKD 3B, hyperlipidemia, COPD, left foot transmetatarsal amputation, recent RUL lung mass who presents with shortness of breath and palpitations.  Wife at bedside helps to collaborate with patient's history. Patient just underwent successful cardioversion with cardiology outpatient on 3/7.  He subsequently had bradycardia in the immediate following days and was advised to hold his beta-blocker.  Patient had been monitoring his heart rate daily and today noticed it had increased up to 130 just while sitting.  He has chronic shortness of breath due to COPD but has noticed increased shortness of breath today with hypoxia down to 81% on room air. He has PRN oxygen at home and had to use it. He contacted cardiology and was then advised to present to ED.  He denies any chest pain, palpitations. No nausea, vomiting or diarrhea. No LE edema.  He had cardioversion in 2019 when he was first diagnosed with atrial fibrillation and had heart failure in the immediate following days per wife so they were concerned for the same today.   In the ED, HR of 70s and was in NSR on EKG.   CBC unremarkable.   Na of 130, K of 4.7, creatinine of 1.94.   BNP elevated at 457 which is higher than prior in the 300s.   Troponin of 20.   He was started on lasix '40mg'$  IV and hospitalist consulted for admission.  Dr. Humphrey Rolls with cardiology was also consulted by EDP and will following.    Review of Systems: As mentioned in the history of present illness. All other systems reviewed and  are negative. Past Medical History:  Diagnosis Date   A-fib ([place]) 03/20/2018   Acute blood loss anemia    Benign essential HTN    Benign prostatic hyperplasia with urinary retention    Cerebrovascular accident (CVA) ([place])    CKD (chronic kidney disease), stage III ([place])    Diabetes ([place]) 03/20/2018   Diabetes mellitus type 2 in nonobese [facility])    Essential hypertension 03/20/2018   Former tobacco use    H/O ulcerative colitis 1993   Hypertension    Hypokalemia    Leukocytosis    New onset atrial fibrillation ([place])    Osteomyelitis ([place])    a. 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   PAF (paroxysmal atrial fibrillation) ([place])    a. dx 03/2018 in setting of stroke, osteomyelitis.   Sepsis ([place]) 03/20/2018   Stage 3 chronic kidney disease ([place])    pt/family unaware   Status post transmetatarsal amputation of left foot ([place])    Stroke ([place]) 03/2018   Stroke-like episode ([place]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 03/16/2018   Tachycardia 03/20/2018   Wrist fracture    Left   Past Surgical History:  Procedure Laterality Date   AMPUTATION Left 03/24/2018   Procedure: Transmetatarsal Amputation Left Foot;  Surgeon: [name], MD;  Location: [facility];  Service: Orthopedics;  Laterality: Left;   CARDIOVERSION N/A 05/05/2018   Procedure: CARDIOVERSION;  Surgeon: [name], MD;  Location: [place];  Service: Cardiovascular;  Laterality: N/A;   CARDIOVERSION N/A 12/02/2022   Procedure:  CARDIOVERSION;  Surgeon: Buford Dresser, MD;  Location[facility] ENDOSCOPY;  Service: Cardiovascular;  Laterality: N/A;   COLONOSCOPY  12/2017   benign, remission from ulcerative colitis in 90s   I & D EXTREMITY Left 03/17/2018   Procedure: IRRIGATION AND DEBRIDEMENT FOOT;  Surgeon: [name], MD;  Location: [facility];  Service: Orthopedics;  Laterality: Left;   STUMP REVISION Left 05/10/2018   Procedure: REVISION LEFT TRANSMETATARSAL AMPUTATION;  Surgeon: [name], MD;  Location: [facility];  Service: Orthopedics;   Laterality: Left;   Social History:  reports that he quit smoking about 39 years ago. His smoking use included cigarettes. He started smoking about 61 years ago. He has a 20.00 pack-year smoking history. He has never used smokeless tobacco. He reports current alcohol use of about 4.0 standard drinks of alcohol per week. He reports that he does not use drugs.  No Known Allergies  Family History  Problem Relation Age of Onset   Hypertension Father     Prior to Admission medications   Medication Sig Start Date End Date Taking? Authorizing Provider  albuterol (VENTOLIN HFA) 108 (90 Base) MCG/ACT inhaler Inhale 2 puffs into the lungs every 6 (six) hours as needed for wheezing or shortness of breath. 06/03/20   Brand Males, MD  apixaban (ELIQUIS) 5 MG TABS tablet Take 1 tablet (5 mg total) by mouth 2 (two) times daily. 07/14/22   Burnell Blanks, MD  diltiazem (CARDIZEM CD) 120 MG 24 hr capsule Take 1 capsule (120 mg total) by mouth daily. 10/27/22   Burnell Blanks, MD  empagliflozin (JARDIANCE) 10 MG TABS tablet Take 10 mg by mouth daily.    [provider]  escitalopram (LEXAPRO) 10 MG tablet Take 10 mg by mouth in the morning. 04/06/22   [provider]  fluconazole (DIFLUCAN) 200 MG tablet Take 200 mg by mouth daily. 11/25/22   [provider]  furosemide (LASIX) 20 MG tablet TAKE 1 TABLET DAILY. YOU   MAY TAKE AN EXTRA '20MG'$      TABLET FOR INCREASED       SWELLING OR WEIGHT GAIN 07/14/22   Burnell Blanks, MD  insulin glargine (LANTUS) 100 UNIT/ML injection Inject 20-38 Units into the skin 2 (two) times daily.    [provider]  loratadine (CLARITIN) 10 MG tablet Take 10 mg by mouth at bedtime.    [provider]  mesalamine (LIALDA) 1.2 g EC tablet Take 1.2 g by mouth 2 (two) times daily. 05/07/20   [provider]  metoprolol succinate (TOPROL XL) 50 MG 24 hr tablet Hold this medication until you have follow up  with your cardiology provider. 12/02/22   Buford Dresser, MD  Multiple Vitamin (MULTIVITAMIN WITH MINERALS) TABS tablet Take 1 tablet by mouth daily.    [provider]  Multiple Vitamins-Minerals (PRESERVISION AREDS 2 PO) Take 1 tablet by mouth daily.    [provider]  oxybutynin (DITROPAN) 5 MG tablet Take 5 mg by mouth at bedtime.    [provider]  rosuvastatin (CRESTOR) 10 MG tablet Take 0.5 tablets (5 mg total) by mouth daily. 10/30/21   Burnell Blanks, MD  tamsulosin (FLOMAX) 0.4 MG CAPS capsule Take 1 capsule (0.4 mg total) by mouth 2 times daily at 12 noon and 4 pm. 03/31/18   Love, Ivan Anchors, PA-C  Tiotropium Bromide-Olodaterol (STIOLTO RESPIMAT) 2.5-2.5 MCG/ACT AERS Inhale 2 puffs into the lungs daily. 10/30/21   Parrett, Fonnie Mu, NP  trimethoprim (TRIMPEX)  100 MG tablet Take 100 mg by mouth at bedtime.  08/02/18   [provider]    Physical Exam: Vitals:   12/07/22 2230 12/07/22 2245 12/07/22 2300 12/07/22 2315  BP: 138/75 (!) 140/75 125/75 (!) 112/95  Pulse: 72 73 72 73  Resp: '20 16 15 '$ (!) 22  Temp:      TempSrc:      SpO2: 91% 94% 92% 92%  Weight:      Height:       Constitutional: NAD, calm, comfortable, chronically ill-appearing elderly male laying flat in bed.  Eyes:  lids and conjunctivae normal ENMT: Mucous membranes are moist.  Neck: normal, supple Respiratory: Diminished lung sounds throughout but no wheezing, no crackles. Normal respiratory effort. No accessory muscle use.  Cardiovascular: Regular rate and rhythm, no murmurs / rubs / gallops. No extremity edema.   Abdomen: no tenderness, Bowel sounds positive.  Musculoskeletal: no clubbing / cyanosis.  Status post Transmetatarsal of the left foot, good ROM, no contractures. Normal muscle tone.  Skin:   Has telangiectasia and erythema around bilateral maxillary region. Neurologic: CN 2-12 grossly intact Psychiatric: Normal judgment and insight. Alert and oriented x 3.  Normal mood. Data Reviewed:  See HPI   Assessment and Plan: * Acute respiratory failure with hypoxia ([place]) -secondary to likely CHF exacerbation following recent cardioversion on 3/7. Pt did not have overt edema seem on CXR or on exam but BNP is slightly more elevated than his baseline.  -will also check COVID PCR -lower likelihood of pulmonary embolism since he has been complaint with Eliquis BID  -Continue IV Lasix 40 mg - Monitor intake and output, daily weight -Obtain new echocardiogram -Last echocardiogram in 2021 with LVEF of 55% and no wall motion abnormalitie of the left ventricle s.  No valvular abnormality. -Will continue to hold beta-blocker for now with acute CHF -Cardiology consulted by ED physician and will follow along  History of recurrent UTI (urinary tract infection) - Continue prophylactic trimethoprim prescribed by urology  Lung mass - Patient has been following with pulmonologist Dr. [place] Carbo outpatient for surveillance of a right upper lobe spiculated mass as well as left-sided pulmonary nodules. 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  Patient is aware and has follow-up coming up with Dr. Lamonte Sakai to discuss further treatment.  COPD (chronic obstructive pulmonary disease) ([place]) - Stable.  Not in acute exacerbation. 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  Continue diltiazem. -Metoprolol has been on hold due to bradycardia immediately following his procedure.  Will continue to hold with suspected CHF exacerbation -Continue Eliquis  Essential hypertension - Continue diltiazem and  IV diuresis      Advance Care Planning: Full  Consults: cardiology  Family Communication: wife at bedside  Severity of Illness: The appropriate patient status for this patient is INPATIENT. Inpatient status is judged to be reasonable and necessary in order to provide the required intensity of service to ensure the patient's safety. The patient's presenting symptoms, physical exam findings, and initial radiographic and laboratory data in the context of their chronic comorbidities is felt to place them at high risk for further clinical deterioration. Furthermore, it is not anticipated that the  patient will be medically stable for discharge from the hospital within 2 midnights of admission.   * I certify that at the point of admission it is my clinical judgment that the patient will require inpatient hospital care spanning beyond 2 midnights from the point of admission due to high intensity of service, high risk for further deterioration and high frequency of surveillance required.*  Author: Orene Desanctis, DO 12/08/2022 1:14 AM  For on call review [email or web address].

## 2022-12-07 NOTE — H&P (Incomplete)
History and Physical    Patient: Kevin [name] [id number] DOB: [birth year] DOA: 12/07/2022 DOS: the patient was seen and examined on 12/07/2022 PCP: [name], MD  Patient coming from: Home  Chief Complaint:  Chief Complaint  Patient presents with  . Tachycardia   HPI: Kevin [name] is a 82 y.o. male with medical history significant of hypertension, HFpEF, paroxysmal atrial fibrillation on Eliquis, type 2 diabetes, CVA, CKD 3B, hyperlipidemia, COPD, left foot transmetatarsal amputation who presents with shortness of breath and palpitations.  Patient just underwent successful cardioversion with cardiology outpatient on 3/7.  Review of Systems: {ROS_Text:26778} Past Medical History:  Diagnosis Date  . A-fib ([place]) 03/20/2018  . Acute blood loss anemia   . Benign essential HTN   . Benign prostatic hyperplasia with urinary retention   . Cerebrovascular accident (CVA) ([place])   . CKD (chronic kidney disease), stage III ([place])   . Diabetes ([place]) 03/20/2018  . Diabetes mellitus type 2 in nonobese ([facility])   . Essential hypertension 03/20/2018  . Former tobacco use   . H/O ulcerative colitis 1993  . Hypertension   . Hypokalemia   . Leukocytosis   . New onset atrial fibrillation ([place])   . Osteomyelitis ([place])    a. 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  [name] (paroxysmal atrial fibrillation) ([place])    a. dx 03/2018 in setting of stroke, osteomyelitis.  . Sepsis ([place]) 03/20/2018  . Stage 3 chronic kidney disease ([place])    pt/family unaware  . Status post transmetatarsal amputation of left foot ([place])   . Stroke ([place]) 03/2018  . Stroke-like episode ([place]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 03/16/2018  . Tachycardia 03/20/2018  . Wrist fracture    Left   Past Surgical History:  Procedure Laterality Date  . AMPUTATION Left 03/24/2018   Procedure: Transmetatarsal Amputation Left Foot;  Surgeon: [name], MD;  Location: [facility];  Service: Orthopedics;  Laterality: Left;  . CARDIOVERSION N/A  05/05/2018   Procedure: CARDIOVERSION;  Surgeon: [name], MD;  Location: [facility];  Service: Cardiovascular;  Laterality: N/A;  . CARDIOVERSION N/A 12/02/2022   Procedure: CARDIOVERSION;  Surgeon: Buford Dresser, MD;  Location[facility];  Service: Cardiovascular;  Laterality: N/A;  . COLONOSCOPY  12/2017   benign, remission from ulcerative colitis in 90s  . I & D EXTREMITY Left 03/17/2018   Procedure: IRRIGATION AND DEBRIDEMENT FOOT;  Surgeon: [name], MD;  Location: [facility];  Service: Orthopedics;  Laterality: Left;  . STUMP REVISION Left 05/10/2018   Procedure: REVISION LEFT TRANSMETATARSAL AMPUTATION;  Surgeon: [name], MD;  Location: [facility];  Service: Orthopedics;  Laterality: Left;   Social History:  reports that he quit smoking about 39 years ago. His smoking use included cigarettes. He started smoking about 61 years ago. He has a 20.00 pack-year smoking history. He has never used smokeless tobacco. He reports current alcohol use of about 4.0 standard drinks of alcohol per week. He reports that he does not use drugs.  No Known Allergies  Family History  Problem Relation Age of Onset  . Hypertension Father     Prior to Admission medications   Medication Sig Start Date End Date Taking? Authorizing Provider  albuterol (VENTOLIN HFA) 108 (90 Base) MCG/ACT inhaler Inhale 2 puffs into the lungs every 6 (six) hours as needed for wheezing or shortness of breath. 06/03/20   Brand Males, MD  apixaban (ELIQUIS) 5 MG TABS tablet Take 1 tablet (5 mg total) by mouth 2 (two) times  daily. 07/14/22   Burnell Blanks, MD  diltiazem (CARDIZEM CD) 120 MG 24 hr capsule Take 1 capsule (120 mg total) by mouth daily. 10/27/22   Burnell Blanks, MD  empagliflozin (JARDIANCE) 10 MG TABS tablet Take 10 mg by mouth daily.    [provider]  escitalopram (LEXAPRO) 10 MG tablet Take 10 mg by mouth in the morning. 04/06/22   [provider]   fluconazole (DIFLUCAN) 200 MG tablet Take 200 mg by mouth daily. 11/25/22   [provider]  furosemide (LASIX) 20 MG tablet TAKE 1 TABLET DAILY. YOU   MAY TAKE AN EXTRA '20MG'$      TABLET FOR INCREASED       SWELLING OR WEIGHT GAIN 07/14/22   Burnell Blanks, MD  insulin glargine (LANTUS) 100 UNIT/ML injection Inject 20-38 Units into the skin 2 (two) times daily.    [provider]  loratadine (CLARITIN) 10 MG tablet Take 10 mg by mouth at bedtime.    [provider]  mesalamine (LIALDA) 1.2 g EC tablet Take 1.2 g by mouth 2 (two) times daily. 05/07/20   [provider]  metoprolol succinate (TOPROL XL) 50 MG 24 hr tablet Hold this medication until you have follow up with your cardiology provider. 12/02/22   Buford Dresser, MD  Multiple Vitamin (MULTIVITAMIN WITH MINERALS) TABS tablet Take 1 tablet by mouth daily.    [provider]  Multiple Vitamins-Minerals (PRESERVISION AREDS 2 PO) Take 1 tablet by mouth daily.    [provider]  oxybutynin (DITROPAN) 5 MG tablet Take 5 mg by mouth at bedtime.    [provider]  rosuvastatin (CRESTOR) 10 MG tablet Take 0.5 tablets (5 mg total) by mouth daily. 10/30/21   Burnell Blanks, MD  tamsulosin (FLOMAX) 0.4 MG CAPS capsule Take 1 capsule (0.4 mg total) by mouth 2 times daily at 12 noon and 4 pm. 03/31/18   Love, Ivan Anchors, PA-C  Tiotropium Bromide-Olodaterol (STIOLTO RESPIMAT) 2.5-2.5 MCG/ACT AERS Inhale 2 puffs into the lungs daily. 10/30/21   Parrett, Fonnie Mu, NP  trimethoprim (TRIMPEX) 100 MG tablet Take 100 mg by mouth at bedtime.  08/02/18   [provider]    Physical Exam: Vitals:   12/07/22 2230 12/07/22 2245 12/07/22 2300 12/07/22 2315  BP: 138/75 (!) 140/75 125/75 (!) 112/95  Pulse: 72 73 72 73  Resp: '20 16 15 '$ (!) 22  Temp:      TempSrc:      SpO2: 91% 94% 92% 92%  Weight:      Height:       *** Data Reviewed: {Tip this will not be part of the note  when signed- Document your independent interpretation of telemetry tracing, EKG, lab, Radiology test or any other diagnostic tests. Add any new diagnostic test ordered today. (Optional):26781} {Results:26384}  Assessment and Plan: No notes have been filed under this hospital service. Service: Hospitalist     Advance Care Planning:   Code Status: Prior ***  Consults: ***  Family Communication: ***  Severity of Illness: {Observation/Inpatient:21159}  Author: Orene Desanctis, DO 12/07/2022 11:59 PM  For on call review [email or web address].

## 2022-12-07 NOTE — Telephone Encounter (Signed)
Call received, patient short of breath at rest and hypoxic to 81% on room air, home O2 placed with improvement to 90%. Pt recently cardioverted and remains on cardizem, toprol held. HR initially bradycardic in the days immediately following cardioversion, but now in the 130s. I suspect he has has early return to Afib. Given his hypoxia and RVR, I suspect this will be best managed in the [facility]. I advised administration of his home eliquis that is due now. Given that he has not missed any doses of eliquis, may be a candidate for cardioversion by EDP if in fact in Afib RVR.   She expressed understanding of the plan and will call EMS for transport.   Ledora Bottcher, PA-C 12/07/2022, 8:04 PM [place] [address] [place], [place] [address]

## 2022-12-08 ENCOUNTER — Inpatient Hospital Stay (HOSPITAL_COMMUNITY): Payer: Medicare Other

## 2022-12-08 ENCOUNTER — Encounter (HOSPITAL_COMMUNITY): Payer: Self-pay | Admitting: Family Medicine

## 2022-12-08 DIAGNOSIS — N319 Neuromuscular dysfunction of bladder, unspecified: Secondary | ICD-10-CM | POA: Diagnosis not present

## 2022-12-08 DIAGNOSIS — Z794 Long term (current) use of insulin: Secondary | ICD-10-CM | POA: Diagnosis not present

## 2022-12-08 DIAGNOSIS — N136 Pyonephrosis: Secondary | ICD-10-CM | POA: Diagnosis present

## 2022-12-08 DIAGNOSIS — I5032 Chronic diastolic (congestive) heart failure: Secondary | ICD-10-CM | POA: Diagnosis not present

## 2022-12-08 DIAGNOSIS — I480 Paroxysmal atrial fibrillation: Secondary | ICD-10-CM | POA: Diagnosis present

## 2022-12-08 DIAGNOSIS — N1832 Chronic kidney disease, stage 3b: Secondary | ICD-10-CM | POA: Diagnosis present

## 2022-12-08 DIAGNOSIS — N179 Acute kidney failure, unspecified: Secondary | ICD-10-CM | POA: Diagnosis not present

## 2022-12-08 DIAGNOSIS — Z1152 Encounter for screening for COVID-19: Secondary | ICD-10-CM | POA: Diagnosis not present

## 2022-12-08 DIAGNOSIS — N3289 Other specified disorders of bladder: Secondary | ICD-10-CM | POA: Diagnosis not present

## 2022-12-08 DIAGNOSIS — M86172 Other acute osteomyelitis, left ankle and foot: Secondary | ICD-10-CM | POA: Diagnosis present

## 2022-12-08 DIAGNOSIS — E1169 Type 2 diabetes mellitus with other specified complication: Secondary | ICD-10-CM | POA: Diagnosis present

## 2022-12-08 DIAGNOSIS — E1122 Type 2 diabetes mellitus with diabetic chronic kidney disease: Secondary | ICD-10-CM | POA: Diagnosis present

## 2022-12-08 DIAGNOSIS — J8410 Pulmonary fibrosis, unspecified: Secondary | ICD-10-CM | POA: Diagnosis present

## 2022-12-08 DIAGNOSIS — I5033 Acute on chronic diastolic (congestive) heart failure: Secondary | ICD-10-CM

## 2022-12-08 DIAGNOSIS — R918 Other nonspecific abnormal finding of lung field: Secondary | ICD-10-CM | POA: Diagnosis present

## 2022-12-08 DIAGNOSIS — J439 Emphysema, unspecified: Secondary | ICD-10-CM | POA: Diagnosis present

## 2022-12-08 DIAGNOSIS — Z8616 Personal history of COVID-19: Secondary | ICD-10-CM | POA: Diagnosis not present

## 2022-12-08 DIAGNOSIS — I639 Cerebral infarction, unspecified: Secondary | ICD-10-CM | POA: Diagnosis not present

## 2022-12-08 DIAGNOSIS — K5190 Ulcerative colitis, unspecified, without complications: Secondary | ICD-10-CM | POA: Diagnosis present

## 2022-12-08 DIAGNOSIS — R0902 Hypoxemia: Secondary | ICD-10-CM

## 2022-12-08 DIAGNOSIS — Z66 Do not resuscitate: Secondary | ICD-10-CM | POA: Diagnosis present

## 2022-12-08 DIAGNOSIS — L97521 Non-pressure chronic ulcer of other part of left foot limited to breakdown of skin: Secondary | ICD-10-CM | POA: Diagnosis not present

## 2022-12-08 DIAGNOSIS — E871 Hypo-osmolality and hyponatremia: Secondary | ICD-10-CM | POA: Diagnosis not present

## 2022-12-08 DIAGNOSIS — E119 Type 2 diabetes mellitus without complications: Secondary | ICD-10-CM | POA: Diagnosis not present

## 2022-12-08 DIAGNOSIS — I2510 Atherosclerotic heart disease of native coronary artery without angina pectoris: Secondary | ICD-10-CM | POA: Diagnosis not present

## 2022-12-08 DIAGNOSIS — R338 Other retention of urine: Secondary | ICD-10-CM | POA: Diagnosis not present

## 2022-12-08 DIAGNOSIS — I10 Essential (primary) hypertension: Secondary | ICD-10-CM | POA: Diagnosis not present

## 2022-12-08 DIAGNOSIS — L97529 Non-pressure chronic ulcer of other part of left foot with unspecified severity: Secondary | ICD-10-CM | POA: Diagnosis present

## 2022-12-08 DIAGNOSIS — R0602 Shortness of breath: Secondary | ICD-10-CM | POA: Diagnosis present

## 2022-12-08 DIAGNOSIS — Z87440 Personal history of urinary (tract) infections: Secondary | ICD-10-CM | POA: Diagnosis not present

## 2022-12-08 DIAGNOSIS — E11621 Type 2 diabetes mellitus with foot ulcer: Secondary | ICD-10-CM | POA: Diagnosis present

## 2022-12-08 DIAGNOSIS — I4819 Other persistent atrial fibrillation: Secondary | ICD-10-CM | POA: Diagnosis not present

## 2022-12-08 DIAGNOSIS — I2723 Pulmonary hypertension due to lung diseases and hypoxia: Secondary | ICD-10-CM | POA: Diagnosis present

## 2022-12-08 DIAGNOSIS — J9621 Acute and chronic respiratory failure with hypoxia: Secondary | ICD-10-CM | POA: Diagnosis present

## 2022-12-08 DIAGNOSIS — J9601 Acute respiratory failure with hypoxia: Secondary | ICD-10-CM | POA: Diagnosis not present

## 2022-12-08 DIAGNOSIS — D649 Anemia, unspecified: Secondary | ICD-10-CM | POA: Diagnosis not present

## 2022-12-08 DIAGNOSIS — J449 Chronic obstructive pulmonary disease, unspecified: Secondary | ICD-10-CM | POA: Diagnosis not present

## 2022-12-08 DIAGNOSIS — E1165 Type 2 diabetes mellitus with hyperglycemia: Secondary | ICD-10-CM | POA: Diagnosis present

## 2022-12-08 DIAGNOSIS — C3490 Malignant neoplasm of unspecified part of unspecified bronchus or lung: Secondary | ICD-10-CM | POA: Diagnosis present

## 2022-12-08 DIAGNOSIS — N1330 Unspecified hydronephrosis: Secondary | ICD-10-CM | POA: Diagnosis not present

## 2022-12-08 DIAGNOSIS — I130 Hypertensive heart and chronic kidney disease with heart failure and stage 1 through stage 4 chronic kidney disease, or unspecified chronic kidney disease: Secondary | ICD-10-CM | POA: Diagnosis present

## 2022-12-08 DIAGNOSIS — E1151 Type 2 diabetes mellitus with diabetic peripheral angiopathy without gangrene: Secondary | ICD-10-CM | POA: Diagnosis present

## 2022-12-08 LAB — GLUCOSE, CAPILLARY: Glucose-Capillary: 253 mg/dL — ABNORMAL HIGH (ref 70–99)

## 2022-12-08 LAB — BASIC METABOLIC PANEL
Anion gap: 12 (ref 5–15)
BUN: 20 mg/dL (ref 8–23)
CO2: 20 mmol/L — ABNORMAL LOW (ref 22–32)
Calcium: 8.7 mg/dL — ABNORMAL LOW (ref 8.9–10.3)
Chloride: 100 mmol/L (ref 98–111)
Creatinine, Ser: 2.07 mg/dL — ABNORMAL HIGH (ref 0.61–1.24)
GFR, Estimated: 32 mL/min — ABNORMAL LOW (ref >60)
Glucose, Bld: 183 mg/dL — ABNORMAL HIGH (ref 70–99)
Potassium: 3.7 mmol/L (ref 3.5–5.1)
Sodium: 132 mmol/L — ABNORMAL LOW (ref 135–145)

## 2022-12-08 LAB — ECHOCARDIOGRAM COMPLETE
AR max vel: 2.6 cm2
AV Area VTI: 2.78 cm2
AV Area mean vel: 2.55 cm2
AV Mean grad: 1 mmHg
AV Peak grad: 2.7 mmHg
Ao pk vel: 0.82 m/s
Area-P 1/2: 3.97 cm2
Height: 74 in
MV VTI: 1.9 cm2
S' Lateral: 3.3 cm
Weight: 3520 oz

## 2022-12-08 LAB — CBC
HCT: 43.1 % (ref 39.0–52.0)
Hemoglobin: 14.2 g/dL (ref 13.0–17.0)
MCH: 28.9 pg (ref 26.0–34.0)
MCHC: 32.9 g/dL (ref 30.0–36.0)
MCV: 87.6 fL (ref 80.0–100.0)
Platelets: 327 10*3/uL (ref 150–400)
RBC: 4.92 MIL/uL (ref 4.22–5.81)
RDW: 15.3 % (ref 11.5–15.5)
WBC: 10.5 10*3/uL (ref 4.0–10.5)
nRBC: 0 % (ref 0.0–0.2)

## 2022-12-08 LAB — CBG MONITORING, ED
Glucose-Capillary: 180 mg/dL — ABNORMAL HIGH (ref 70–99)
Glucose-Capillary: 216 mg/dL — ABNORMAL HIGH (ref 70–99)
Glucose-Capillary: 251 mg/dL — ABNORMAL HIGH (ref 70–99)
Glucose-Capillary: 114 mg/dL — ABNORMAL HIGH (ref 70–99)

## 2022-12-08 LAB — RESP PANEL BY RT-PCR (RSV, FLU A&B, COVID)  RVPGX2
Influenza A by PCR: NEGATIVE
Influenza B by PCR: NEGATIVE
Resp Syncytial Virus by PCR: NEGATIVE
SARS Coronavirus 2 by RT PCR: NEGATIVE

## 2022-12-08 LAB — HEMOGLOBIN A1C
Hgb A1c MFr Bld: 10.2 % — ABNORMAL HIGH (ref 4.8–5.6)
Mean Plasma Glucose: 246 mg/dL

## 2022-12-08 LAB — TROPONIN I (HIGH SENSITIVITY): Troponin I (High Sensitivity): 19 ng/L — ABNORMAL HIGH (ref <18)

## 2022-12-08 MED ORDER — UMECLIDINIUM-VILANTEROL 62.5-25 MCG/ACT IN AEPB
1.0000 | INHALATION_SPRAY | Freq: Every day | RESPIRATORY_TRACT | Status: DC
  Administered 2022-12-09 – 2022-12-13 (×5): 1 via RESPIRATORY_TRACT
  Filled 2022-12-08 (×2): qty 14

## 2022-12-08 MED ORDER — INSULIN ASPART 100 UNIT/ML IJ SOLN
0.0000 [IU] | Freq: Three times a day (TID) | INTRAMUSCULAR | Status: DC
  Administered 2022-12-08: 5 [IU] via SUBCUTANEOUS
  Administered 2022-12-08 (×2): 8 [IU] via SUBCUTANEOUS
  Administered 2022-12-09: 3 [IU] via SUBCUTANEOUS
  Administered 2022-12-09 (×2): 8 [IU] via SUBCUTANEOUS
  Administered 2022-12-09 – 2022-12-10 (×2): 5 [IU] via SUBCUTANEOUS
  Administered 2022-12-10: 11 [IU] via SUBCUTANEOUS
  Administered 2022-12-10 – 2022-12-11 (×3): 8 [IU] via SUBCUTANEOUS
  Administered 2022-12-11: 5 [IU] via SUBCUTANEOUS
  Administered 2022-12-11 (×2): 8 [IU] via SUBCUTANEOUS
  Administered 2022-12-12: 15 [IU] via SUBCUTANEOUS
  Administered 2022-12-12: 5 [IU] via SUBCUTANEOUS
  Administered 2022-12-12: 2 [IU] via SUBCUTANEOUS
  Administered 2022-12-12 – 2022-12-13 (×2): 3 [IU] via SUBCUTANEOUS

## 2022-12-08 MED ORDER — MESALAMINE 1.2 G PO TBEC
1.2000 g | DELAYED_RELEASE_TABLET | Freq: Two times a day (BID) | ORAL | Status: DC
  Administered 2022-12-08 – 2022-12-13 (×10): 1.2 g via ORAL
  Filled 2022-12-08 (×14): qty 1

## 2022-12-08 MED ORDER — APIXABAN 5 MG PO TABS
5.0000 mg | ORAL_TABLET | Freq: Two times a day (BID) | ORAL | Status: DC
  Administered 2022-12-08 – 2022-12-13 (×11): 5 mg via ORAL
  Filled 2022-12-08 (×11): qty 1

## 2022-12-08 MED ORDER — DILTIAZEM HCL ER COATED BEADS 120 MG PO CP24
120.0000 mg | ORAL_CAPSULE | Freq: Every day | ORAL | Status: DC
  Administered 2022-12-08 – 2022-12-13 (×6): 120 mg via ORAL
  Filled 2022-12-08 (×8): qty 1

## 2022-12-08 MED ORDER — GADOBUTROL 1 MMOL/ML IV SOLN
10.0000 mL | Freq: Once | INTRAVENOUS | Status: AC | PRN
  Administered 2022-12-08: 10 mL via INTRAVENOUS

## 2022-12-08 MED ORDER — OXYBUTYNIN CHLORIDE 5 MG PO TABS
5.0000 mg | ORAL_TABLET | Freq: Every day | ORAL | Status: DC
  Administered 2022-12-08 – 2022-12-12 (×6): 5 mg via ORAL
  Filled 2022-12-08 (×6): qty 1

## 2022-12-08 MED ORDER — TRIMETHOPRIM 100 MG PO TABS
100.0000 mg | ORAL_TABLET | Freq: Every day | ORAL | Status: DC
  Administered 2022-12-08 – 2022-12-12 (×6): 100 mg via ORAL
  Filled 2022-12-08 (×7): qty 1

## 2022-12-08 MED ORDER — FUROSEMIDE 40 MG PO TABS
40.0000 mg | ORAL_TABLET | Freq: Every day | ORAL | Status: DC
  Administered 2022-12-08 – 2022-12-09 (×2): 40 mg via ORAL
  Filled 2022-12-08: qty 2
  Filled 2022-12-08: qty 1

## 2022-12-08 MED ORDER — ALBUTEROL SULFATE (2.5 MG/3ML) 0.083% IN NEBU: 2.5000 mg | INHALATION_SOLUTION | RESPIRATORY_TRACT | Status: DC | PRN

## 2022-12-08 MED ORDER — TAMSULOSIN HCL 0.4 MG PO CAPS
0.4000 mg | ORAL_CAPSULE | Freq: Two times a day (BID) | ORAL | Status: DC
  Administered 2022-12-08 – 2022-12-12 (×10): 0.4 mg via ORAL
  Filled 2022-12-08 (×10): qty 1

## 2022-12-08 MED ORDER — ROSUVASTATIN CALCIUM 5 MG PO TABS
5.0000 mg | ORAL_TABLET | Freq: Every day | ORAL | Status: DC
  Administered 2022-12-08 – 2022-12-13 (×6): 5 mg via ORAL
  Filled 2022-12-08 (×6): qty 1

## 2022-12-08 MED ORDER — MEDIHONEY WOUND/BURN DRESSING EX PSTE
1.0000 | PASTE | Freq: Every day | CUTANEOUS | Status: DC
  Administered 2022-12-09 – 2022-12-12 (×4): 1 via TOPICAL
  Filled 2022-12-08 (×2): qty 44

## 2022-12-08 MED ORDER — UMECLIDINIUM BROMIDE 62.5 MCG/ACT IN AEPB
1.0000 | INHALATION_SPRAY | Freq: Every day | RESPIRATORY_TRACT | Status: DC
  Administered 2022-12-08: 1 via RESPIRATORY_TRACT
  Filled 2022-12-08: qty 7

## 2022-12-08 MED ORDER — MELATONIN 5 MG PO TABS
5.0000 mg | ORAL_TABLET | Freq: Every evening | ORAL | Status: DC | PRN
  Administered 2022-12-08 – 2022-12-12 (×5): 5 mg via ORAL
  Filled 2022-12-08 (×5): qty 1

## 2022-12-08 MED ORDER — FUROSEMIDE 10 MG/ML IJ SOLN: 40.0000 mg | Freq: Every day | INTRAMUSCULAR | Status: DC

## 2022-12-08 MED ORDER — ARFORMOTEROL TARTRATE 15 MCG/2ML IN NEBU
15.0000 ug | INHALATION_SOLUTION | Freq: Two times a day (BID) | RESPIRATORY_TRACT | Status: DC
  Administered 2022-12-08 (×2): 15 ug via RESPIRATORY_TRACT
  Filled 2022-12-08 (×2): qty 2

## 2022-12-08 MED ORDER — METOPROLOL TARTRATE 25 MG PO TABS
25.0000 mg | ORAL_TABLET | Freq: Two times a day (BID) | ORAL | Status: DC
  Administered 2022-12-08 – 2022-12-09 (×3): 25 mg via ORAL
  Filled 2022-12-08 (×3): qty 1

## 2022-12-08 NOTE — Assessment & Plan Note (Addendum)
-  Slightly worse, likely related to diuresis -Attempt to avoid nephrotoxic medications -Hold Lasix for now -Was planning to dc on 3/14 but will keep one additional day, hold Lasix, and recheck renal function in AM

## 2022-12-08 NOTE — ED Notes (Signed)
ED TO INPATIENT HANDOFF REPORT  ED Nurse Name and Phone #: Leafy Ro, [place]  S Name/Age/Gender Kevin [name] 82 y.o. male Room/Bed: 042C/042C  Code Status   Code Status: DNR  Home/SNF/Other Home Patient oriented to: self, place, time, and situation Is this baseline? Yes   Triage Complete: Triage complete  Chief Complaint Acute respiratory failure with hypoxia ([place]) [J96.01]  Triage Note Pt BIB [facility] from home with c/o of a high heart rate. Pt initally called PA for medication questions but was recommended to come here. Pt does have a hx of A-fib and COPD. Pt was just here a week ago and had to be cardioverted.   EMS VS P 136 initially 136.... afterwards 82 R 25-30 BP 136/82 O2 94% 2L [place] CBG 136    Allergies No Known Allergies  Level of Care/Admitting Diagnosis ED Disposition     ED Disposition  Admit   Condition  --   Comment  Hospital Area: [place] [[id number]]  Level of Care: Telemetry Cardiac [[id number]]  May admit patient to [facility] or [facility] if equivalent level of care is available:: No  Covid Evaluation: Asymptomatic - no recent exposure (last 10 days) testing not required  Diagnosis: Acute respiratory failure with hypoxia [facility]) [id number]  Admitting Physician: Orene Desanctis [id number]  Attending Physician: Orene Desanctis [id number]  Certification:: I certify this patient will need inpatient services for at least 2 midnights  Estimated Length of Stay: 2          B Medical/Surgery History Past Medical History:  Diagnosis Date   A-fib ([place]) 03/20/2018   Acute blood loss anemia    Benign prostatic hyperplasia with urinary retention    Cerebrovascular accident (CVA) ([place])    Diabetes mellitus type 2 in nonobese [facility])    Essential hypertension 03/20/2018   Former tobacco use    H/O ulcerative colitis 1993   Leukocytosis    Osteomyelitis ([place])    a. 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   PAF (paroxysmal atrial fibrillation)  ([place])    a. dx 03/2018 in setting of stroke, osteomyelitis.   Stage 3 chronic kidney disease ([place])    pt/family unaware   Status post transmetatarsal amputation of left foot [facility])    Past Surgical History:  Procedure Laterality Date   AMPUTATION Left 03/24/2018   Procedure: Transmetatarsal Amputation Left Foot;  Surgeon: [name], MD;  Location: [place];  Service: Orthopedics;  Laterality: Left;   CARDIOVERSION N/A 05/05/2018   Procedure: CARDIOVERSION;  Surgeon: [name], MD;  Location: [place];  Service: Cardiovascular;  Laterality: N/A;   CARDIOVERSION N/A 12/02/2022   Procedure: CARDIOVERSION;  Surgeon: Buford Dresser, MD;  Location: [facility] ENDOSCOPY;  Service: Cardiovascular;  Laterality: N/A;   COLONOSCOPY  12/2017   benign, remission from ulcerative colitis in 90s   I & D EXTREMITY Left 03/17/2018   Procedure: [place];  Surgeon: [name], MD;  Location: [place];  Service: Orthopedics;  Laterality: Left;   STUMP REVISION Left 05/10/2018   Procedure: REVISION LEFT TRANSMETATARSAL AMPUTATION;  Surgeon: [name], MD;  Location: [place];  Service: Orthopedics;  Laterality: Left;     A IV Location/Drains/Wounds Patient Lines/Drains/Airways Status     Active Line/Drains/Airways     Name Placement date Placement time Site Days   Peripheral IV 12/07/22 20 G Left;Posterior Hand 12/07/22  2129  Hand  1   Negative Pressure Wound Therapy Foot Left 05/10/18  1134  --  1673   Wound / Incision (Open or Dehisced) 12/08/22 Other (Comment) Foot Left;Posterior 12/08/22  --  Foot  less than 1            Intake/Output Last 24 hours  Intake/Output Summary (Last 24 hours) at 12/08/2022 1901 Last data filed at 12/08/2022 1337 Gross per 24 hour  Intake 300 ml  Output 1600 ml  Net -1300 ml    Labs/Imaging Results for orders placed or performed during the hospital encounter of 12/07/22 (from the past 48 hour(s))  Basic metabolic panel      Status: Abnormal   Collection Time: 12/07/22  9:30 PM  Result Value Ref Range   Sodium 130 (L) 135 - 145 mmol/L   Potassium 4.7 3.5 - 5.1 mmol/L   Chloride 97 (L) 98 - 111 mmol/L   CO2 19 (L) 22 - 32 mmol/L   Glucose, Bld 153 (H) 70 - 99 mg/dL    Comment: Glucose reference range applies only to samples taken after fasting for at least 8 hours.   BUN 23 8 - 23 mg/dL   Creatinine, Ser 1.94 (H) 0.61 - 1.24 mg/dL   Calcium 8.9 8.9 - 10.3 mg/dL   GFR, Estimated 35 (L) >60 mL/min    Comment: (NOTE) Calculated using the CKD-EPI Creatinine Equation (2021)    Anion gap 14 5 - 15    Comment: Performed at [facility] [address], [place], [place] [address]  Troponin I (High Sensitivity)     Status: Abnormal   Collection Time: 12/07/22  9:30 PM  Result Value Ref Range   Troponin I (High Sensitivity) 20 (H) <18 ng/L    Comment: (NOTE) Elevated high sensitivity troponin I (hsTnI) values and significant  changes across serial measurements may suggest ACS but many other  chronic and acute conditions are known to elevate hsTnI results.  Refer to the "Links" section for chest pain algorithms and additional  guidance. Performed at [facility], [place] [address], [place], [place] [address]   CBC with Differential     Status: Abnormal   Collection Time: 12/07/22  9:30 PM  Result Value Ref Range   WBC 8.8 4.0 - 10.5 K/uL   RBC 5.23 4.22 - 5.81 MIL/uL   Hemoglobin 15.2 13.0 - 17.0 g/dL   HCT 46.3 39.0 - 52.0 %   MCV 88.5 80.0 - 100.0 fL   MCH 29.1 26.0 - 34.0 pg   MCHC 32.8 30.0 - 36.0 g/dL   RDW 15.1 11.5 - 15.5 %   Platelets 332 150 - 400 K/uL   nRBC 0.0 0.0 - 0.2 %   Neutrophils Relative % 64 %   Neutro Abs 5.7 1.7 - 7.7 K/uL   Lymphocytes Relative 20 %   Lymphs Abs 1.8 0.7 - 4.0 K/uL   Monocytes Relative 14 %   Monocytes Absolute 1.2 (H) 0.1 - 1.0 K/uL   Eosinophils Relative 1 %   Eosinophils Absolute 0.1 0.0 - 0.5 K/uL   Basophils Relative 1 %   Basophils Absolute  0.1 0.0 - 0.1 K/uL   Immature Granulocytes 0 %   Abs Immature Granulocytes 0.02 0.00 - 0.07 K/uL    Comment: Performed at [facility] [address], [place], [place] [address]  Brain natriuretic peptide     Status: Abnormal   Collection Time: 12/07/22  9:30 PM  Result Value Ref Range   B Natriuretic Peptide 475.5 (H) 0.0 - 100.0 pg/mL    Comment:  Performed at [facility], [place] [address], [place], [place] [address]  Troponin I (High Sensitivity)     Status: Abnormal   Collection Time: 12/08/22 12:06 AM  Result Value Ref Range   Troponin I (High Sensitivity) 19 (H) <18 ng/L    Comment: (NOTE) Elevated high sensitivity troponin I (hsTnI) values and significant  changes across serial measurements may suggest ACS but many other  chronic and acute conditions are known to elevate hsTnI results.  Refer to the "Links" section for chest pain algorithms and additional  guidance. Performed at [facility], [place] [address] [place], [place] [address]   Resp panel by RT-PCR (RSV, Flu A&B, Covid) Anterior Nasal Swab     Status: None   Collection Time: 12/08/22  1:08 AM   Specimen: Anterior Nasal Swab  Result Value Ref Range   SARS Coronavirus 2 by RT PCR NEGATIVE NEGATIVE   Influenza A by PCR NEGATIVE NEGATIVE   Influenza B by PCR NEGATIVE NEGATIVE    Comment: (NOTE) The Xpert Xpress SARS-CoV-2/FLU/RSV plus assay is intended as an aid in the diagnosis of influenza from Nasopharyngeal swab specimens and should not be used as a sole basis for treatment. Nasal washings and aspirates are unacceptable for Xpert Xpress SARS-CoV-2/FLU/RSV testing.  Fact Sheet for Patients: EntrepreneurPulse.com.au  Fact Sheet for Healthcare Providers: IncredibleEmployment.be  This test is not yet approved or cleared by the [place] FDA and has been authorized for detection and/or diagnosis of SARS-CoV-2 by FDA under an Emergency Use Authorization  (EUA). This EUA will remain in effect (meaning this test can be used) for the duration of the COVID-19 declaration under Section 564(b)(1) of the Act, 21 U.S.C. section 360bbb-3(b)(1), unless the authorization is terminated or revoked.     Resp Syncytial Virus by PCR NEGATIVE NEGATIVE    Comment: (NOTE) Fact Sheet for Patients: EntrepreneurPulse.com.au  Fact Sheet for Healthcare Providers: IncredibleEmployment.be  This test is not yet approved or cleared by the [place] FDA and has been authorized for detection and/or diagnosis of SARS-CoV-2 by FDA under an Emergency Use Authorization (EUA). This EUA will remain in effect (meaning this test can be used) for the duration of the COVID-19 declaration under Section 564(b)(1) of the Act, 21 U.S.C. section 360bbb-3(b)(1), unless the authorization is terminated or revoked.  Performed at [facility], [place] [address] [place], [place] [address]   CBG monitoring, ED     Status: Abnormal   Collection Time: 12/08/22  1:09 AM  Result Value Ref Range   Glucose-Capillary 180 (H) 70 - 99 mg/dL    Comment: Glucose reference range applies only to samples taken after fasting for at least 8 hours.  CBC     Status: None   Collection Time: 12/08/22  1:56 AM  Result Value Ref Range   WBC 10.5 4.0 - 10.5 K/uL   RBC 4.92 4.22 - 5.81 MIL/uL   Hemoglobin 14.2 13.0 - 17.0 g/dL   HCT 43.1 39.0 - 52.0 %   MCV 87.6 80.0 - 100.0 fL   MCH 28.9 26.0 - 34.0 pg   MCHC 32.9 30.0 - 36.0 g/dL   RDW 15.3 11.5 - 15.5 %   Platelets 327 150 - 400 K/uL   nRBC 0.0 0.0 - 0.2 %    Comment: Performed at [facility], [place] [address], [place], [place] [id number]  Basic metabolic panel     Status: Abnormal   Collection Time: 12/08/22  1:56 AM  Result Value Ref Range   Sodium 132 (L) 135 - 145 mmol/L   Potassium 3.7 3.5 - 5.1 mmol/L   Chloride 100 98 - 111 mmol/L   CO2 20 (L) 22 - 32 mmol/L   Glucose, Bld 183 (H) 70  - 99 mg/dL    Comment: Glucose reference range applies only to samples taken after fasting for at least 8 hours.   BUN 20 8 - 23 mg/dL   Creatinine, Ser 2.07 (H) 0.61 - 1.24 mg/dL   Calcium 8.7 (L) 8.9 - 10.3 mg/dL   GFR, Estimated 32 (L) >60 mL/min    Comment: (NOTE) Calculated using the CKD-EPI Creatinine Equation (2021)    Anion gap 12 5 - 15    Comment: Performed at [facility] [address] [place], [place] [address]  CBG monitoring, ED     Status: Abnormal   Collection Time: 12/08/22  8:06 AM  Result Value Ref Range   Glucose-Capillary 216 (H) 70 - 99 mg/dL    Comment: Glucose reference range applies only to samples taken after fasting for at least 8 hours.  CBG monitoring, ED     Status: Abnormal   Collection Time: 12/08/22  1:15 PM  Result Value Ref Range   Glucose-Capillary 251 (H) 70 - 99 mg/dL    Comment: Glucose reference range applies only to samples taken after fasting for at least 8 hours.  CBG monitoring, ED     Status: Abnormal   Collection Time: 12/08/22  4:50 PM  Result Value Ref Range   Glucose-Capillary 114 (H) 70 - 99 mg/dL    Comment: Glucose reference range applies only to samples taken after fasting for at least 8 hours.   MR FOOT LEFT W WO CONTRAST  Result Date: 12/08/2022 CLINICAL DATA:  Plantar foot ulcer.  History of prior amputations. EXAM: MRI OF THE LEFT FOREFOOT WITHOUT AND WITH CONTRAST TECHNIQUE: Multiplanar, multisequence MR imaging of the left foot was performed both before and after administration of intravenous contrast. CONTRAST:  24m GADAVIST GADOBUTROL 1 MMOL/ML IV SOLN COMPARISON:  Prior MRI from 2019 FINDINGS: Surgical changes from midfoot amputation at the tarsal metatarsal level. There is a deep open wound on the plantar aspect of the foot extending down to the bone. Abnormal STIR signal intensity and enhancement in the medial cuneiform consistent with osteomyelitis. No findings suspicious for septic arthritis. No drainable soft  tissue abscess or pyomyositis. IMPRESSION: 1. Deep open wound on the plantar aspect of the foot extending down to the bone. 2. Osteomyelitis involving the medial cuneiform. 3. No findings for septic arthritis or soft tissue abscess. Electronically Signed   By: PMarijo SanesM.D.   On: 12/08/2022 15:58   ECHOCARDIOGRAM COMPLETE  Result Date: 12/08/2022    ECHOCARDIOGRAM REPORT   Patient Name:   Kevin KRANERDate of Exam: 12/08/2022 Medical Rec #:  [id number]          Height:       74.0 in Accession #:    [id number]         Weight:       220.0 lb Date of Birth:  [birth year]         BSA:          2.263 m Patient Age:    89years            BP:           116/81 mmHg Patient Gender: M  HR:           81 bpm. Exam Location:  Inpatient Procedure: 2D Echo, Cardiac Doppler and Color Doppler Indications:    CHF  History:        Patient has prior history of Echocardiogram examinations, most                 recent 04/24/2020. CHF, COPD and Stroke, Arrythmias:Atrial                 Fibrillation and Tachycardia; Risk Factors:Hypertension,                 Diabetes, Dyslipidemia and Former Smoker.  Sonographer:    Wenda Low Referring Phys: [id number] Hercules T TU  Sonographer Comments: Technically difficult study due to poor echo windows. Image acquisition challenging due to respiratory motion. IMPRESSIONS  1. Left ventricular ejection fraction, by estimation, is 55 to 60%. The left ventricle has normal function. The left ventricle has no regional wall motion abnormalities. Left ventricular diastolic parameters are indeterminate. There is the interventricular septum is flattened in systole, suggestive of right ventricular pressure overload.  2. Right ventricular systolic function is mildly reduced. The right ventricular size is moderately enlarged. There is normal pulmonary artery systolic pressure. The estimated right ventricular systolic pressure is [id number] mmHg.  3. Left atrial size was mildly dilated.   4. Right atrial size was moderately dilated.  5. The mitral valve is grossly normal. Trivial mitral valve regurgitation. No evidence of mitral stenosis.  6. The aortic valve is tricuspid. There is mild calcification of the aortic valve. There is mild thickening of the aortic valve. Aortic valve regurgitation is not visualized. No aortic stenosis is present.  7. The inferior vena cava is normal in size with greater than 50% respiratory variability, suggesting right atrial pressure of 3 mmHg. FINDINGS  Left Ventricle: Left ventricular ejection fraction, by estimation, is 55 to 60%. The left ventricle has normal function. The left ventricle has no regional wall motion abnormalities. The left ventricular internal cavity size was normal in size. There is  no left ventricular hypertrophy. The interventricular septum is flattened in systole, consistent with right ventricular pressure overload. Left ventricular diastolic parameters are indeterminate. Right Ventricle: The right ventricular size is moderately enlarged. No increase in right ventricular wall thickness. Right ventricular systolic function is mildly reduced. There is normal pulmonary artery systolic pressure. The tricuspid regurgitant velocity is 2.62 m/s, and with an assumed right atrial pressure of 3 mmHg, the estimated right ventricular systolic pressure is [id number] mmHg. Left Atrium: Left atrial size was mildly dilated. Right Atrium: Right atrial size was moderately dilated. Pericardium: There is no evidence of pericardial effusion. Mitral Valve: The mitral valve is grossly normal. Trivial mitral valve regurgitation. No evidence of mitral valve stenosis. MV peak gradient, 2.8 mmHg. The mean mitral valve gradient is 1.0 mmHg. Tricuspid Valve: The tricuspid valve is normal in structure. Tricuspid valve regurgitation is mild . No evidence of tricuspid stenosis. Aortic Valve: The aortic valve is tricuspid. There is mild calcification of the aortic valve. There is  mild thickening of the aortic valve. Aortic valve regurgitation is not visualized. No aortic stenosis is present. Aortic valve mean gradient measures 1.0 mmHg. Aortic valve peak gradient measures 2.7 mmHg. Aortic valve area, by VTI measures 2.78 cm. Pulmonic Valve: The pulmonic valve was normal in structure. Pulmonic valve regurgitation is not visualized. No evidence of pulmonic stenosis. Aorta: The aortic root is normal in size and structure. Ascending aorta  measurements are within normal limits for age when indexed to body surface area. Venous: The inferior vena cava is normal in size with greater than 50% respiratory variability, suggesting right atrial pressure of 3 mmHg. IAS/Shunts: No atrial level shunt detected by color flow Doppler.  LEFT VENTRICLE PLAX 2D LVIDd:         4.60 cm LVIDs:         3.30 cm LV PW:         0.90 cm LV IVS:        1.00 cm LVOT diam:     2.00 cm LV SV:         41 LV SV Index:   18 LVOT Area:     3.14 cm  RIGHT VENTRICLE RV Basal diam:  5.00 cm RV Mid diam:    4.20 cm RV S prime:     10.20 cm/s TAPSE (M-mode): 1.8 cm LEFT ATRIUM             Index        RIGHT ATRIUM           Index LA diam:        4.90 cm 2.16 cm/m   RA Area:     37.10 cm LA Vol (A2C):   81.3 ml 35.92 ml/m  RA Volume:   141.00 ml 62.30 ml/m LA Vol (A4C):   93.8 ml 41.44 ml/m LA Biplane Vol: 88.1 ml 38.93 ml/m  AORTIC VALVE                    PULMONIC VALVE AV Area (Vmax):    2.60 cm     PV Vmax:       0.93 m/s AV Area (Vmean):   2.55 cm     PV Peak grad:  3.4 mmHg AV Area (VTI):     2.78 cm AV Vmax:           82.00 cm/s AV Vmean:          50.100 cm/s AV VTI:            0.146 m AV Peak Grad:      2.7 mmHg AV Mean Grad:      1.0 mmHg LVOT Vmax:         67.90 cm/s LVOT Vmean:        40.700 cm/s LVOT VTI:          0.129 m LVOT/AV VTI ratio: 0.88  AORTA Ao Root diam: 3.70 cm MITRAL VALVE               TRICUSPID VALVE MV Area (PHT): 3.97 cm    TR Peak grad:   27.5 mmHg MV Area VTI:   1.90 cm    TR Vmax:         262.00 cm/s MV Peak grad:  2.8 mmHg MV Mean grad:  1.0 mmHg    SHUNTS MV Vmax:       0.83 m/s    Systemic VTI:  0.13 m MV Vmean:      43.1 cm/s   Systemic Diam: 2.00 cm MV Decel Time: 191 msec MV E velocity: 85.90 cm/s Cherlynn Kaiser MD Electronically signed by Cherlynn Kaiser MD Signature Date/Time: 12/08/2022/2:15:48 PM    Final    CT CHEST WO CONTRAST  Result Date: 12/08/2022 CLINICAL DATA:  Pneumonia. Complication suspected. * Tracking Code: BO * EXAM: CT CHEST WITHOUT CONTRAST TECHNIQUE: Multidetector CT imaging of the chest was performed following the  standard protocol without IV contrast. RADIATION DOSE REDUCTION: This exam was performed according to the departmental dose-optimization program which includes automated exposure control, adjustment of the mA and/or kV according to patient size and/or use of iterative reconstruction technique. COMPARISON:  11/03/2022 CT.  Plain film of 1 day prior FINDINGS: Cardiovascular: Aortic atherosclerosis. Tortuous thoracic aorta. Mild cardiomegaly, with trace anterior pericardial fluid or thickening. Left main and 3 vessel coronary artery calcification. Mediastinum/Nodes: Multiple small middle mediastinal nodes are similar, none pathologic by size criteria. Hilar regions poorly evaluated without intravenous contrast. Fluid or debris level within the esophagus on 75/3. Lungs/Pleura: No pleural fluid.  Mild centrilobular emphysema. Irregular right upper lobe pulmonary nodule measures 2.0 x 1.4 cm on 56/4 versus similar on the prior exam when measured in a similar fashion. This is contiguous with an area of more linear, bandlike opacity which is also unchanged. Relatively linear posterior left upper lobe density of maximally 1.8 cm on 66/4 is similar to on the prior exam (when remeasured). Presumed subpleural lymph node about the posterior left major fissure at 7 mm on 91/4 is unchanged. No evidence of pneumonia. Upper Abdomen: Normal imaged portions of the liver,  spleen, stomach, left kidney. Minimal nodularity within the left adrenal gland. A right adrenal 1.4 cm nodule measures 6.2 HU, consistent with an adenoma. Musculoskeletal: Remote right clavicular fracture. Remote posttraumatic deformity of the L1 vertebral body. IMPRESSION: 1. No evidence of pneumonia. 2. No significant change from 11/03/2022 chest CT. 3. Similar right upper lobe pulmonary nodule which had enlarged on that exam and is suspicious for primary bronchogenic carcinoma. Smaller left-sided pulmonary nodules are unchanged. 4. Aortic atherosclerosis (ICD10-I70.0), coronary artery atherosclerosis and emphysema (ICD10-J43.9). 5. Right adrenal adenoma . In the absence of clinically indicated signs/symptoms require(s) no independent follow-up. 6. Esophageal air fluid level suggests dysmotility or gastroesophageal reflux. Electronically Signed   By: Abigail Miyamoto M.D.   On: 12/08/2022 09:51   DG Chest Port 1 View  Result Date: 12/07/2022 CLINICAL DATA:  Shortness of breath EXAM: PORTABLE CHEST 1 VIEW COMPARISON:  Chest x-ray 08/21/2019 FINDINGS: The heart size and mediastinal contours are within normal limits. There is a focal opacity in the right upper lobe which is slightly linear. The lungs are otherwise clear. There is no pleural effusion or pneumothorax. The visualized skeletal structures are unremarkable. IMPRESSION: Focal opacity in the right upper lobe which is slightly linear. This could be due to atelectasis or scarring. Recommend follow-up chest x-ray in 4-6 weeks to ensure resolution. Electronically Signed   By: Ronney Asters M.D.   On: 12/07/2022 22:07    Pending Labs Unresulted Labs (From admission, onward)     Start     Ordered   12/09/22 0500  CBC with Differential/Platelet  Tomorrow morning,   R        12/08/22 1403   12/09/22 [id number]  Basic metabolic panel  Tomorrow morning,   R        12/08/22 1403   12/08/22 1457  Respiratory (~20 pathogens) panel by PCR  (Respiratory panel by PCR  (~20 pathogens, ~24 hr TAT)  w precautions)  Once,   R        12/08/22 1456   12/08/22 0058  Hemoglobin A1c  Once,   R       Comments: To assess prior glycemic control    12/08/22 0058            Vitals/Pain Today's Vitals   12/08/22 1400 12/08/22 1445 12/08/22 1601  12/08/22 1745  BP: 123/68 109/79  (!) 152/97  Pulse: 79 100  98  Resp: 14 (!) 23  (!) 22  Temp:   97.7 F (36.5 C)   TempSrc:   Oral   SpO2: 93% 90%  91%  Weight:      Height:      PainSc:        Isolation Precautions Droplet precaution  Medications Medications  insulin aspart (novoLOG) injection 0-15 Units ( Subcutaneous Not Given 12/08/22 1716)  trimethoprim (TRIMPEX) tablet 100 mg (100 mg Oral Given 12/08/22 0151)  rosuvastatin (CRESTOR) tablet 5 mg (5 mg Oral Given 12/08/22 0959)  apixaban (ELIQUIS) tablet 5 mg (5 mg Oral Given 12/08/22 0959)  tamsulosin (FLOMAX) capsule 0.4 mg (0.4 mg Oral Given 12/08/22 1613)  mesalamine (LIALDA) EC tablet 1.2 g (1.2 g Oral Not Given 12/08/22 1141)  oxybutynin (DITROPAN) tablet 5 mg (5 mg Oral Given 12/08/22 0150)  diltiazem (CARDIZEM CD) 24 hr capsule 120 mg (120 mg Oral Not Given 12/08/22 1140)  metoprolol tartrate (LOPRESSOR) tablet 25 mg (25 mg Oral Given 12/08/22 0959)  furosemide (LASIX) tablet 40 mg (40 mg Oral Given 12/08/22 0958)  albuterol (PROVENTIL) (2.5 MG/3ML) 0.083% nebulizer solution 2.5 mg (has no administration in time range)  leptospermum manuka honey (MEDIHONEY) paste 1 Application (1 Application Topical Not Given 12/08/22 1715)  umeclidinium-vilanterol (ANORO ELLIPTA) 62.5-25 MCG/ACT 1 puff (1 puff Inhalation Not Given 12/08/22 1707)  albuterol (PROVENTIL) (2.5 MG/3ML) 0.083% nebulizer solution 5 mg (5 mg Nebulization Given 12/07/22 2317)  furosemide (LASIX) injection 40 mg (40 mg Intravenous Given 12/07/22 2318)  gadobutrol (GADAVIST) 1 MMOL/ML injection 10 mL (10 mLs Intravenous Contrast Given 12/08/22 1550)    Mobility walks     Focused  Assessments Cardiac Assessment Handoff:  Cardiac Rhythm: Atrial fibrillation Lab Results  Component Value Date   CKTOTAL 2,084 (H) 03/16/2018   CKMB 13.1 (H) 03/16/2018   TROPONINI <0.03 05/06/2018   Lab Results  Component Value Date   DDIMER 0.35 08/24/2019   Does the Patient currently have chest pain? No    R Recommendations: See Admitting Provider Note  Report given to:   Additional Notes: Pt is AOX4, ambulatory, 02 at baseline, cloudy urine, Yates saw his this,  small wound to sole of left foot, toes have been amputated, able to verbalize needs, continent, does have BUE tremors, hard for him to hold pill cup, meds whole in water

## 2022-12-08 NOTE — Assessment & Plan Note (Signed)
-  I have discussed code status with the patient and he would not desire resuscitation and would prefer to die a natural death should that situation arise.

## 2022-12-08 NOTE — Consult Note (Signed)
Cardiology Consultation   Patient ID: Kevin [name] MRN: [id number]; DOB: May 01, 1941  Admit date: 12/07/2022 Date of Consult: 12/08/2022  PCP:  [name], MD   [place] [facility] Cardiologist:  Lauree Chandler, MD        Patient Profile:   Kevin [name] is a 82 y.o. male with a hx of hypertension, HFpEF, paroxysmal atrial fibrillation on Eliquis, type 2 diabetes, CVA, CKD 3B, hyperlipidemia, COPD, left foot transmetatarsal amputation, recent RUL lung mass  who is being seen 12/08/2022 for the evaluation of hypoxia at the request of Dr. Flossie Buffy  History of Present Illness:   [name] is a 82 y.o. male with a hx of hypertension, HFpEF, paroxysmal atrial fibrillation on Eliquis, type 2 diabetes, CVA, CKD 3B, hyperlipidemia, COPD, left foot transmetatarsal amputation, recent RUL lung mass  who is being seen 12/08/2022 for the evaluation of hypoxia at the request of Dr. Flossie Buffy. He was recently [facility] on 3/7. Today he called the cardiology office that he is short of breath at rest and hypoxic to 81% on room air, home O2 placed with improvement to 90%. He thought he was in Afib RVR. He was told to come to the ED to get evaluated. Here he has been in NSR. He has chronic shortness of breath due to COPD but has noticed increased shortness of breath recently. He denies any CP or leg edema. In the ED, BNP was slightly elevated to 400s; Cr 1.94. There was concern of HF exacerbation + COPD exacerbation hence cardiology was consulted.    Past Medical History:  Diagnosis Date   A-fib ([place]) 03/20/2018   Acute blood loss anemia    Benign essential HTN    Benign prostatic hyperplasia with urinary retention    Cerebrovascular accident (CVA) ([place])    CKD (chronic kidney disease), stage III ([place])    Diabetes ([place]) 03/20/2018   Diabetes mellitus type 2 in nonobese [facility])    Essential hypertension 03/20/2018   Former tobacco use    H/O ulcerative colitis 1993   Hypertension     Hypokalemia    Leukocytosis    New onset atrial fibrillation ([place])    Osteomyelitis ([place])    a. 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   PAF (paroxysmal atrial fibrillation) ([place])    a. dx 03/2018 in setting of stroke, osteomyelitis.   Sepsis ([place]) 03/20/2018   Stage 3 chronic kidney disease ([place])    pt/family unaware   Status post transmetatarsal amputation of left foot ([place])    Stroke ([place]) 03/2018   Stroke-like episode ([place]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 03/16/2018   Tachycardia 03/20/2018   Wrist fracture    Left    Past Surgical History:  Procedure Laterality Date   AMPUTATION Left 03/24/2018   Procedure: Transmetatarsal Amputation Left Foot;  Surgeon: [name], MD;  Location: [place];  Service: Orthopedics;  Laterality: Left;   CARDIOVERSION N/A 05/05/2018   Procedure: CARDIOVERSION;  Surgeon: [name], MD;  Location: [place];  Service: Cardiovascular;  Laterality: N/A;   CARDIOVERSION N/A 12/02/2022   Procedure: CARDIOVERSION;  Surgeon: Buford Dresser, MD;  Location: [facility] ENDOSCOPY;  Service: Cardiovascular;  Laterality: N/A;   COLONOSCOPY  12/2017   benign, remission from ulcerative colitis in 90s   I & D EXTREMITY Left 03/17/2018   Procedure: [place];  Surgeon: [name], MD;  Location: [place];  Service: Orthopedics;  Laterality: Left;   STUMP REVISION Left 05/10/2018   Procedure: REVISION LEFT TRANSMETATARSAL  AMPUTATION;  Surgeon: [name], MD;  Location: [place];  Service: Orthopedics;  Laterality: Left;       Inpatient Medications: Scheduled Meds:  apixaban  5 mg Oral BID   arformoterol  15 mcg Nebulization BID   And   umeclidinium bromide  1 puff Inhalation Daily   diltiazem  120 mg Oral Daily   furosemide  40 mg Intravenous Daily   insulin aspart  0-15 Units Subcutaneous TID PC & HS   mesalamine  1.2 g Oral BID   oxybutynin  5 mg Oral QHS   rosuvastatin  5 mg Oral Daily   tamsulosin  0.4 mg Oral q12n4p   trimethoprim  100  mg Oral QHS   Continuous Infusions:  PRN Meds:   Allergies:   No Known Allergies  Social History:   Social History   Socioeconomic History   Marital status: Married    Spouse name: Not on file   Number of children: Not on file   Years of education: Not on file   Highest education level: Bachelor's degree (e.g., BA, AB, BS)  Occupational History   Not on file  Tobacco Use   Smoking status: Former    Packs/day: 1.00    Years: 20.00    Total pack years: 20.00    Types: Cigarettes    Start date: 1963    Quit date: 1985    Years since quitting: 39.2   Smokeless tobacco: Never  Vaping Use   Vaping Use: Never used  Substance and Sexual Activity   Alcohol use: Yes    Alcohol/week: 4.0 standard drinks of alcohol    Types: 4 Cans of beer per week    Comment: social    Drug use: Never   Sexual activity: Not on file  Other Topics Concern   Not on file  Social History Narrative   Lives at home with his wife and two cats. Married for almost 45 years   Right handed   Caffeine: 1 cup every morning   Social Determinants of Health   Financial Resource Strain: Not on file  Food Insecurity: Not on file  Transportation Needs: Not on file  Physical Activity: Not on file  Stress: Not on file  Social Connections: Not on file  Intimate Partner Violence: Not on file    Family History:   Family History  Problem Relation Age of Onset   Hypertension Father      ROS:  Please see the history of present illness.  All other ROS reviewed and negative.     Physical Exam/Data:   Vitals:   12/07/22 2245 12/07/22 2300 12/07/22 2315 12/08/22 0100  BP: (!) 140/75 125/75 (!) 112/95 136/68  Pulse: 73 72 73 80  Resp: 16 15 (!) 22 15  Temp:      TempSrc:      SpO2: 94% 92% 92% (!) 89%  Weight:      Height:        Intake/Output Summary (Last 24 hours) at 12/08/2022 0139 Last data filed at 12/07/2022 2129 Gross per 24 hour  Intake 300 ml  Output --  Net 300 ml      12/07/2022     9:37 PM 12/02/2022    8:00 AM 11/25/2022   11:24 AM  Last 3 Weights  Weight (lbs) 220 lb 222 lb 230 lb 9.6 oz  Weight (kg) 99.791 kg 100.699 kg 104.599 kg     Body mass index is 28.25 kg/m.  General:  Well nourished, well developed, in mild distress HEENT: normal Neck: no JVD Vascular: No carotid bruits; Distal pulses 2+ bilaterally Cardiac:  normal S1, S2; RRR; no murmur  Lungs:  Poor airflow Abd: soft, nontender, no hepatomegaly  Ext: no edema Musculoskeletal:  No deformities, BUE and BLE strength normal and equal Skin: Has minor skin lesions Neuro:  CNs 2-12 intact, no focal abnormalities noted Psych:  Normal affect   EKG:  The EKG was personally reviewed and demonstrates:  NSR with RBBB  Relevant CV Studies:  Echo 2021:  1. Abnormal septal motion . Left ventricular ejection fraction, by  estimation, is 55%. The left ventricle has low normal function. The left  ventricle has no regional wall motion abnormalities. The left ventricular  internal cavity size was mildly  dilated. There is mild left ventricular hypertrophy. Left ventricular  diastolic parameters were normal.   2. Right ventricular systolic function is normal. The right ventricular  size is normal.   3. Left atrial size was moderately dilated.   4. The mitral valve is normal in structure. Trivial mitral valve  regurgitation. No evidence of mitral stenosis.   5. The aortic valve is normal in structure. Aortic valve regurgitation is  not visualized. Mild aortic valve sclerosis is present, with no evidence  of aortic valve stenosis.   6. The inferior vena cava is normal in size with greater than 50%  respiratory variability, suggesting right atrial pressure of 3 mmHg.   Laboratory Data:  High Sensitivity Troponin:   Recent Labs  Lab 12/07/22 2130 12/08/22 0006  TROPONINIHS 20* 19*     Chemistry Recent Labs  Lab 12/07/22 2130  NA 130*  K 4.7  CL 97*  CO2 19*  GLUCOSE 153*  BUN 23  CREATININE  1.94*  CALCIUM 8.9  GFRNONAA 35*  ANIONGAP 14    No results for input(s): "PROT", "ALBUMIN", "AST", "ALT", "ALKPHOS", "BILITOT" in the last 168 hours. Lipids No results for input(s): "CHOL", "TRIG", "HDL", "LABVLDL", "LDLCALC", "CHOLHDL" in the last 168 hours.  Hematology Recent Labs  Lab 12/07/22 2130  WBC 8.8  RBC 5.23  HGB 15.2  HCT 46.3  MCV 88.5  MCH 29.1  MCHC 32.8  RDW 15.1  PLT 332   Thyroid No results for input(s): "TSH", "FREET4" in the last 168 hours.  BNP Recent Labs  Lab 12/07/22 2130  BNP 475.5*    DDimer No results for input(s): "DDIMER" in the last 168 hours.   Radiology/Studies:  DG Chest Port 1 View  Result Date: 12/07/2022 CLINICAL DATA:  Shortness of breath EXAM: PORTABLE CHEST 1 VIEW COMPARISON:  Chest x-ray 08/21/2019 FINDINGS: The heart size and mediastinal contours are within normal limits. There is a focal opacity in the right upper lobe which is slightly linear. The lungs are otherwise clear. There is no pleural effusion or pneumothorax. The visualized skeletal structures are unremarkable. IMPRESSION: Focal opacity in the right upper lobe which is slightly linear. This could be due to atelectasis or scarring. Recommend follow-up chest x-ray in 4-6 weeks to ensure resolution. Electronically Signed   By: Ronney Asters M.D.   On: 12/07/2022 22:07     Assessment and Plan:   # Acute on Chronic HFpEF # COPD Exacerbation # CKD  # PAF  -Recent DCCVED on 3/7. Currently remains in normal sinus rhythm. Continue diltiazem.  -Continue apixaban -Okay to hold BB -He is more SOB- difficult to discern if due to chronic COPD/lung mass or volume overload. There maybe some component  of fluid overload hence will give IV Lasix 40 mg BID -Strict I/O and weights -Telemetry -Last Echo in 2021- so repeat Echo in AM   Signed, Jaci Lazier, MD  12/08/2022 1:39 AM

## 2022-12-08 NOTE — Progress Notes (Signed)
Lower extremity venous attempted. Patient in MRI. Will attempt again as schedule and patient availability permits.   Darlin Coco, RDMS, RVT

## 2022-12-08 NOTE — Assessment & Plan Note (Addendum)
-  Patient has been following with pulmonologist Dr. West Carbo outpatient for surveillance of a right upper lobe spiculated mass as well as left-sided pulmonary nodules. 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   -Has follow-up coming up with Dr. Lamonte Sakai to discuss further treatment but inpatient consult has been requested.

## 2022-12-08 NOTE — Progress Notes (Signed)
Progress Note   Patient: Kevin [name] [id number] DOB: 08/25/1941 DOA: 12/07/2022     0 DOS: the patient was seen and examined on 12/08/2022   Brief hospital course: Patient with h/o HTN, chronic diastolic CHF, afib on Eliquis s/p DCCV on 3/7, DM, prior CVA, stage 3b CKD, HLD, COPD, PVD s/p L TMA, and recent RUL lung mass who presenting with SOB.  Home O2 sat 81% on RA, improved to 90% with home O2.      Assessment and Plan: * Acute on chronic respiratory failure with hypoxia ([place]) -Patient with known chronic periodic hypoxia for which he uses home O2 on a prn basis but not regularly -He has been persistently SOB, particularly with exertion -O2 sat 81% at home, 89% here -He underwent recent DCCV (3/7) and so thought on presentation was that this was related to CHF -Negative CXR, negative COVID, no real symptoms other than SOB -He was given Lasix but appears to be euvolemic so will not continue -Repeat echo is pending -Cardiology has consulted -Not c/w COPD exacerbation either given lack of cough -Instead, I am concerned that this SOB is related to his recently diagnosed lung mass -Will consult pulm for further evaluation; Dr. Lamonte Sakai is his doctor and is conveniently rounding today  DNR (do not resuscitate) -I have discussed code status with the patient and he would not desire resuscitation and would prefer to die a natural death should that situation arise.   History of recurrent UTI (urinary tract infection) -Continue prophylactic trimethoprim prescribed by urology  Lung mass -Patient has been following with pulmonologist Dr. West Carbo outpatient for surveillance of a right upper lobe spiculated mass as well as left-sided pulmonary nodules. 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   -Has follow-up coming up with Dr. Lamonte Sakai to discuss further treatment but inpatient consult has been requested.  COPD  (chronic obstructive pulmonary disease) (HCC) -No evidence of acute exacerbation at this time. 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 Given, last seen about a month ago -Will obtain MRI -Wound care consulted  Paroxysmal atrial fibrillation ([place]) -Recent successful cardioversion on 3/7 outpatient with cardiology -Possibly in and out of afib at this time -Continue diltiazem. -Metoprolol has been on hold due to bradycardia immediately following his procedure; appears to be ok to restart -Continue Eliquis  Essential hypertension -Continue diltiazem -Resume beta blocker      Subjective: He reports that he is feeling ok but has not been up and walking around.  His only symptom is SOB, primarily with exertion.  No orthopnea, no edema, no cough.  He has O2 but only wears it prn and does not usually need it.  His wife changes the dressing for his L plantar foot ulcer every other day, and [name] Given manages it.  Physical Exam: Vitals:   12/08/22 0753 12/08/22 0754 12/08/22 1100 12/08/22 1159  BP:   114/72   Pulse:   80   Resp:   20   Temp:    98 F (36.7  C)  TempSrc:    Oral  SpO2: 93% 93% 91%   Weight:      Height:       General:  Appears calm and comfortable and is in NAD, on 3L [place] O2 Eyes:  EOMI, normal lids, iris ENT:  grossly normal hearing, lips & tongue, mmm Neck:  no LAD, masses or thyromegaly, no JVD Cardiovascular:  RR with mild tachycardia, no m/r/g. No LE edema.  Respiratory:   CTA bilaterally with no  wheezes/rales/rhonchi.  Normal respiratory effort. Abdomen:  soft, NT, ND; + umbilical hernia (chronic) Skin:  ulcer along L plantar foot   Musculoskeletal:  grossly normal tone BUE/BLE, good ROM, no bony abnormality, s/p L TMA Psychiatric:  grossly normal mood and affect, speech fluent and appropriate, AOx3 Neurologic:  CN 2-12 grossly intact, moves all extremities in coordinated fashion  Radiological Exams on Admission: Independently reviewed - see discussion in A/P where applicable  CT CHEST WO CONTRAST  Result Date: 12/08/2022 CLINICAL DATA:  Pneumonia. Complication suspected. * Tracking Code: BO * EXAM: CT CHEST WITHOUT CONTRAST TECHNIQUE: Multidetector CT imaging of the chest was performed following the standard protocol without IV contrast. RADIATION DOSE REDUCTION: This exam was performed according to the departmental dose-optimization program which includes automated exposure control, adjustment of the mA and/or kV according to patient size and/or use of iterative reconstruction technique. COMPARISON:  11/03/2022 CT.  Plain film of 1 day prior FINDINGS: Cardiovascular: Aortic atherosclerosis. Tortuous thoracic aorta. Mild cardiomegaly, with trace anterior pericardial fluid or thickening. Left main and 3 vessel coronary artery calcification. Mediastinum/Nodes: Multiple small middle mediastinal nodes are similar, none pathologic by size criteria. Hilar regions poorly evaluated without intravenous contrast. Fluid or debris level within the esophagus on 75/3. Lungs/Pleura: No pleural fluid.  Mild centrilobular emphysema. Irregular right upper lobe pulmonary nodule measures 2.0 x 1.4 cm on 56/4 versus similar on the prior exam when measured in a similar fashion. This is contiguous with an area of more linear, bandlike opacity which is also unchanged. Relatively linear posterior left upper lobe density of maximally 1.8 cm on 66/4 is similar to on the prior exam (when remeasured). Presumed subpleural  lymph node about the posterior left major fissure at 7 mm on 91/4 is unchanged. No evidence of pneumonia. Upper Abdomen: Normal imaged portions of the liver, spleen, stomach, left kidney. Minimal nodularity within the left adrenal gland. A right adrenal 1.4 cm nodule measures 6.2 HU, consistent with an adenoma. Musculoskeletal: Remote right clavicular fracture. Remote posttraumatic deformity of the L1 vertebral body. IMPRESSION: 1. No evidence of pneumonia. 2. No significant change from 11/03/2022 chest CT. 3. Similar right upper lobe pulmonary nodule which had enlarged on that exam and is suspicious for primary bronchogenic carcinoma. Smaller left-sided pulmonary nodules are unchanged. 4. Aortic atherosclerosis (ICD10-I70.0), coronary artery atherosclerosis and emphysema (ICD10-J43.9). 5. Right adrenal adenoma . In the absence of clinically indicated signs/symptoms require(s) no independent follow-up. 6. Esophageal air fluid level suggests dysmotility or gastroesophageal reflux. Electronically Signed   By: Abigail Miyamoto M.D.   On: 12/08/2022 09:51   DG Chest Port 1 View  Result Date: 12/07/2022 CLINICAL DATA:  Shortness of breath EXAM: PORTABLE CHEST 1 VIEW COMPARISON:  Chest x-ray 08/21/2019 FINDINGS: The heart size and mediastinal contours are within normal limits. There is a focal opacity in the right upper lobe which is slightly linear. The lungs are otherwise clear. There is no pleural effusion or pneumothorax. The visualized skeletal structures are unremarkable. IMPRESSION: Focal opacity in  the right upper lobe which is slightly linear. This could be due to atelectasis or scarring. Recommend follow-up chest x-ray in 4-6 weeks to ensure resolution. Electronically Signed   By: Ronney Asters M.D.   On: 12/07/2022 22:07    EKG: Independently reviewed from 3/12.  NSR with rate 81; RBBB with nonspecific ST changes    Labs on Admission: I have personally reviewed the available labs and imaging studies at the  time of the admission.  Pertinent labs:    Na++ 132 Glucose 183 BUN 20/Creatinine 2.07/GFR 32 - stable Normal CBC BNP 475.5 HS troponin 20, 19 COVID/flu/RSV negative  Family Communication: None present; I called and left a message for his wife today  Disposition: Status is: Inpatient Remains inpatient appropriate because: persistent hypoxia of uncertain etiology  Planned Discharge Destination: Home   Time spent: 50 minutes  Author: Karmen Bongo, MD 12/08/2022 2:04 PM  For on call review [email or web address].

## 2022-12-08 NOTE — Assessment & Plan Note (Addendum)
-  Recent successful cardioversion on 3/7 outpatient with cardiology -Possibly in and out of afib at this time -Continue diltiazem. -Metoprolol has been on hold due to bradycardia immediately following his procedure; appears to be ok to restart -Continue Eliquis

## 2022-12-08 NOTE — Progress Notes (Signed)
MEWS Progress Note  Patient Details Name: Kevin [name] MRN: [id number] DOB: June 05, 1941 Today's Date: 12/08/2022   MEWS Flowsheet Documentation:  Assess: MEWS Score Temp: 98.1 F (36.7 C) BP: 110/72 MAP (mmHg): 85 Pulse Rate: (!) 102 ECG Heart Rate: (!) 102 Resp: 20 Level of Consciousness: Alert SpO2: 93 % O2 Device: Nasal Cannula Patient Activity (if Appropriate): In bed O2 Flow Rate (L/min): 3 L/min Assess: MEWS Score MEWS Temp: 0 MEWS Systolic: 0 MEWS Pulse: 1 MEWS RR: 0 MEWS LOC: 0 MEWS Score: 1 MEWS Score Color: Green Assess: SIRS CRITERIA SIRS Temperature : 0 SIRS Respirations : 0 SIRS Pulse: 1 SIRS WBC: 0 SIRS Score Sum : 1 SIRS Temperature : 0 SIRS Pulse: 1 SIRS Respirations : 0 SIRS WBC: 0 SIRS Score Sum : 1 Assess: if the MEWS score is Yellow or Red Were vital signs taken at a resting state?: Yes Focused Assessment: No change from prior assessment Does the patient meet 2 or more of the SIRS criteria?: No MEWS guidelines implemented : Yes, yellow Treat MEWS Interventions: Considered administering scheduled or prn medications/treatments as ordered Take Vital Signs Increase Vital Sign Frequency : Yellow: Q2hr x1, continue Q4hrs until patient remains green for 12hrs Escalate MEWS: Escalate: Yellow: Discuss with charge nurse and consider notifying provider and/or RRT Notify: Charge Nurse/RN Name of Charge Nurse/RN Notified: Freddy Finner, RN      [employer]  Kevin [name] 12/08/2022, 9:29 PM

## 2022-12-08 NOTE — Assessment & Plan Note (Addendum)
-  Continue prophylactic trimethoprim prescribed by urology

## 2022-12-08 NOTE — Assessment & Plan Note (Addendum)
-  Continue diltiazem -Resume beta blocker

## 2022-12-08 NOTE — Assessment & Plan Note (Addendum)
-  No evidence of acute exacerbation at this time. -Continue Stiolto (Brovana and Incruse per formulary), Albuterol -He has home O2 but does not wear it regularly -This appears to be the primary reason for his current hospitalization, needs routine use of home O2 -Ambulatory pulse ox prior to dc -Patient and wife counseled -Has f/u with Dr. Lamonte Sakai on 3/22

## 2022-12-08 NOTE — Assessment & Plan Note (Signed)
-  s/p L TMA -Has Wagoner ulcer along L anterior foot -Followed by [name] Given, last seen about a month ago -Will obtain MRI -Wound care consulted

## 2022-12-08 NOTE — Progress Notes (Signed)
Cardiology Progress Note  Patient ID: Thornell Vaswani MRN: [id number] DOB: 1941/05/25 Date of Encounter: 12/08/2022  Primary Cardiologist: Lauree Chandler, MD  Subjective   Chief Complaint: SOB  HPI: Good diuresis.  Appears euvolemic.  Back in A-fib.  ROS:  All other ROS reviewed and negative. Pertinent positives noted in the HPI.     Inpatient Medications  Scheduled Meds:  apixaban  5 mg Oral BID   arformoterol  15 mcg Nebulization BID   And   umeclidinium bromide  1 puff Inhalation Daily   diltiazem  120 mg Oral Daily   furosemide  40 mg Oral Daily   insulin aspart  0-15 Units Subcutaneous TID PC & HS   mesalamine  1.2 g Oral BID   metoprolol tartrate  25 mg Oral BID   oxybutynin  5 mg Oral QHS   rosuvastatin  5 mg Oral Daily   tamsulosin  0.4 mg Oral q12n4p   trimethoprim  100 mg Oral QHS   Continuous Infusions:  PRN Meds:    Vital Signs   Vitals:   12/08/22 0600 12/08/22 0637 12/08/22 0753 12/08/22 0754  BP: 116/81     Pulse: (!) 101     Resp: 18     Temp:  98.2 F (36.8 C)    TempSrc:  Oral    SpO2: 92%  93% 93%  Weight:      Height:        Intake/Output Summary (Last 24 hours) at 12/08/2022 0845 Last data filed at 12/08/2022 0753 Gross per 24 hour  Intake 300 ml  Output 1000 ml  Net -700 ml      12/07/2022    9:37 PM 12/02/2022    8:00 AM 11/25/2022   11:24 AM  Last 3 Weights  Weight (lbs) 220 lb 222 lb 230 lb 9.6 oz  Weight (kg) 99.791 kg 100.699 kg 104.599 kg      Telemetry  Overnight telemetry shows A-fib heart rate 120s, which I personally reviewed.   ECG  The most recent ECG shows normal sinus rhythm heart rate 81, right bundle branch block, first-degree AV block, which I personally reviewed.   Physical Exam   Vitals:   12/08/22 0600 12/08/22 0637 12/08/22 0753 12/08/22 0754  BP: 116/81     Pulse: (!) 101     Resp: 18     Temp:  98.2 F (36.8 C)    TempSrc:  Oral    SpO2: 92%  93% 93%  Weight:      Height:         Intake/Output Summary (Last 24 hours) at 12/08/2022 0845 Last data filed at 12/08/2022 0753 Gross per 24 hour  Intake 300 ml  Output 1000 ml  Net -700 ml       12/07/2022    9:37 PM 12/02/2022    8:00 AM 11/25/2022   11:24 AM  Last 3 Weights  Weight (lbs) 220 lb 222 lb 230 lb 9.6 oz  Weight (kg) 99.791 kg 100.699 kg 104.599 kg    Body mass index is 28.25 kg/m.  General: Well nourished, well developed, in no acute distress Head: Atraumatic, normal size  Eyes: PEERLA, EOMI  Neck: Supple, no JVD Endocrine: No thryomegaly Cardiac: Normal S1, S2; irregular rhythm, no murmurs Lungs: Diminished breath sounds, scattered wheezing noted Abd: Soft, nontender, no hepatomegaly  Ext: No edema, pulses 2+ left trans metatarsal amputation left foot Musculoskeletal: No deformities, BUE and BLE strength normal and equal Skin: Warm and dry,  no rashes   Neuro: Alert and oriented to person, place, time, and situation, CNII-XII grossly intact, no focal deficits  Psych: Normal mood and affect   Labs  High Sensitivity Troponin:   Recent Labs  Lab 12/07/22 2130 12/08/22 0006  TROPONINIHS 20* 19*     Cardiac EnzymesNo results for input(s): "TROPONINI" in the last 168 hours. No results for input(s): "TROPIPOC" in the last 168 hours.  Chemistry Recent Labs  Lab 12/07/22 2130 12/08/22 0156  NA 130* 132*  K 4.7 3.7  CL 97* 100  CO2 19* 20*  GLUCOSE 153* 183*  BUN 23 20  CREATININE 1.94* 2.07*  CALCIUM 8.9 8.7*  GFRNONAA 35* 32*  ANIONGAP 14 12    Hematology Recent Labs  Lab 12/07/22 2130 12/08/22 0156  WBC 8.8 10.5  RBC 5.23 4.92  HGB 15.2 14.2  HCT 46.3 43.1  MCV 88.5 87.6  MCH 29.1 28.9  MCHC 32.8 32.9  RDW 15.1 15.3  PLT 332 327   BNP Recent Labs  Lab 12/07/22 2130  BNP 475.5*    DDimer No results for input(s): "DDIMER" in the last 168 hours.   Radiology  DG Chest Port 1 View  Result Date: 12/07/2022 CLINICAL DATA:  Shortness of breath EXAM: PORTABLE CHEST 1 VIEW  COMPARISON:  Chest x-ray 08/21/2019 FINDINGS: The heart size and mediastinal contours are within normal limits. There is a focal opacity in the right upper lobe which is slightly linear. The lungs are otherwise clear. There is no pleural effusion or pneumothorax. The visualized skeletal structures are unremarkable. IMPRESSION: Focal opacity in the right upper lobe which is slightly linear. This could be due to atelectasis or scarring. Recommend follow-up chest x-ray in 4-6 weeks to ensure resolution. Electronically Signed   By: Ronney Asters M.D.   On: 12/07/2022 22:07    Cardiac Studies  TTE 04/24/2020  1. Abnormal septal motion . Left ventricular ejection fraction, by  estimation, is 55%. The left ventricle has low normal function. The left  ventricle has no regional wall motion abnormalities. The left ventricular  internal cavity size was mildly  dilated. There is mild left ventricular hypertrophy. Left ventricular  diastolic parameters were normal.   2. Right ventricular systolic function is normal. The right ventricular  size is normal.   3. Left atrial size was moderately dilated.   4. The mitral valve is normal in structure. Trivial mitral valve  regurgitation. No evidence of mitral stenosis.   5. The aortic valve is normal in structure. Aortic valve regurgitation is  not visualized. Mild aortic valve sclerosis is present, with no evidence  of aortic valve stenosis.   6. The inferior vena cava is normal in size with greater than 50%  respiratory variability, suggesting right atrial pressure of 3 mmHg.   Patient Profile  Rico Weckesser is a 82 y.o. male with HFpEF, paroxysmal A-fib, diabetes, CKD stage IIIb, hyperlipidemia, COPD, left transmetatarsal amputation admitted on 12/07/2022 with acute hypoxic respiratory failure secondary to HFpEF and COPD.  Assessment & Plan   # Paroxysmal A-fib -Status post cardioversion on 12/02/2022.  Now back with shortness of breath which appears to  be COPD related.  See discussion below.  Back in A-fib this morning. -Continue with rate control strategy for now.  I believe he needs better lung treatment.  I think this is driving his A-fib.  On diltiazem 120 mg extended release.  Add metoprolol to tartrate 25 mg twice daily.  Repeat echo. -Continue Eliquis 5  mg twice daily. -I believe his respiratory distress is likely driving his A-fib.  No utility in performing cardioversion now.  Continue Eliquis in case he improves.  I do favor rate control until his overall condition improves. -Repeat echo.  No echo since 2021.  Something is driving his A-fib.  EF may be reduced.  # Acute hypoxic respiratory failure # Acute on chronic HFpEF -Does not appear volume overloaded.  Expiratory wheezing noted on exam.  Would hold further diuresis.  Repeat echo is pending. -Chest x-ray now that impressive pulmonary edema. -Known history of lung cancer with possible progression.  I would like to repeat a chest CT.  This will give [place] better information about lung architecture as well as if there is any pulmonary edema.  His examination is inconsistent with this.  # COPD # Lung cancer -Chest CT concerning for primary bronchogenic carcinoma.  Now here with worsening respiratory failure.  I would like to check a chest CT.  Possibly we are dealing with pneumonia.  Exam just really consistent with worsening heart failure.  #CKD 3b -Stable.      For questions or updates, please contact [place] Please consult [email or web address] for contact info under        Signed, [place] T. Audie Box, MD, [place]  12/08/2022 8:45 AM

## 2022-12-08 NOTE — Plan of Care (Signed)
  Problem: Education: Goal: Knowledge of General Education information will improve Description: Including pain rating scale, medication(s)/side effects and non-pharmacologic comfort measures Outcome: Progressing   Problem: Health Behavior/Discharge Planning: Goal: Ability to manage health-related needs will improve Outcome: Progressing   Problem: Clinical Measurements: Goal: Respiratory complications will improve Outcome: Progressing   Problem: Clinical Measurements: Goal: Cardiovascular complication will be avoided Outcome: Progressing   Problem: Coping: Goal: Level of anxiety will decrease Outcome: Progressing   Problem: Safety: Goal: Ability to remain free from injury will improve Outcome: Progressing   Problem: Skin Integrity: Goal: Risk for impaired skin integrity will decrease Outcome: Progressing

## 2022-12-08 NOTE — Assessment & Plan Note (Addendum)
-  A1c is 8.7, poor control -hold Jardiance -Continue glargine -Cover with moderate-scale SSI

## 2022-12-08 NOTE — Consult Note (Signed)
NAME:  Kevin [name], MRN:  [id number], DOB:  Nov 17, 1940, LOS: 0 ADMISSION DATE:  12/07/2022, CONSULTATION DATE:  12/08/22 REFERRING MD:  Lorin Mercy CHIEF COMPLAINT:  Dyspnea   History of Present Illness:  Kevin [name] is a 82 y.o. male who has a PMH as below including but not limited to emphysema/COPD, RUL nodule, PAF on Eliquis, HFpEF, HTN, CKD, DM, CVA. He presented to [facility] ED 3/12 with dyspnea. He apparently had DCCV on 3/7 and had been doing fairly well immediately after. Around 3/9, he started feeling dyspnea mainly with exertion. This progressed and on 3/12, he noticed his pulse ox showed HR in the 120s and O2 in 80s. He and wife called cardiology office who instructed them to come to ED.  He was found to have recurrent A.fib along with acute hypoxic respiratory failure with sats in the 80s. He was placed on 3L O2.   He says that he has an O2 concentrator at home but doesn't need to use it much. He has used it intermittently and last was roughly 2 weeks ago. He denies any fevers/chills/sweats, chest pain, cough, N/V/D, abd pain, myalgias, exposures to known sick contacts. He says he has been sitting out on his porch a lot lately. Besides a runny nose, no additional symptoms.  In ED, CXR was negative. BNP mildly up at 400s. CT chest was also obtained and was negative for PNA but showed similar to slightly enlarged RUL nodule. Echo was performed and results are pending.  Given his new O2 requirement, PCCM asked to see in consultation.  Pertinent  Medical History:  has Fracture of radius, distal, left, closed; Primary osteoarthritis of left foot; Strain of left tibialis anterior muscle; Stroke-like episode ([place]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; Essential hypertension; Tachycardia; Sepsis ([place]); Diabetes ([place]); Subacute osteomyelitis, left ankle and foot ([place]); A-fib ([place]); Cerebrovascular accident (CVA) ([place]); Benign essential HTN; Diabetes mellitus type 2 in nonobese [facility]); Hypokalemia; Paroxysmal atrial  fibrillation ([place]); Leukocytosis; SIRS (systemic inflammatory response syndrome) ([place]); Status post transmetatarsal amputation of left foot ([place]); Postoperative pain; Benign prostatic hyperplasia with urinary retention; New onset atrial fibrillation ([place]); Incontinence of feces; History of transmetatarsal amputation of left foot ([place]); 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 ([place]); Diabetic foot ulcer ([place]); Debility; Urinary retention with incomplete bladder emptying; Acute exacerbation of CHF (congestive heart failure) ([place]); Renal insufficiency; Insulin dependent type 2 diabetes mellitus ([place]); Dehiscence of amputation stump ([place]); UTI (urinary tract infection); Chronic diastolic CHF (congestive heart failure) ([place]); HLD (hyperlipidemia); Pneumonia due to COVID-19 virus; Chronic kidney disease, stage 3b ([place]); Chronic respiratory failure ([place]); H/O ulcerative colitis; COPD (chronic obstructive pulmonary disease) ([place]); Postinflammatory pulmonary fibrosis ([place]); Pulmonary nodules/lesions, multiple; Cough; Lung mass; Acute on chronic respiratory failure with hypoxia ([place]); History of recurrent UTI (urinary tract infection); and DNR (do not resuscitate) on their problem list.  Significant Hospital Events: Including procedures, antibiotic start and stop dates in addition to other pertinent events     Interim History / Subjective:  Comfortable on 3L with sats 90-92%. Denies any symptoms at rest.  Objective:  Blood pressure 123/68, pulse 79, temperature 98 F (36.7 C), temperature source Oral, resp. rate 14, height '6\' 2"'$  (1.88 m), weight 99.8 kg, SpO2 93 %.        Intake/Output Summary (Last 24 hours) at 12/08/2022 1447 Last data filed at 12/08/2022 1337 Gross per 24 hour  Intake 300 ml  Output 1600 ml  Net -1300 ml   Filed Weights   12/07/22 2137  Weight: 99.8  kg    Examination: General: Adult male, resting in bed, in NAD. Neuro: A&O x 3, no deficits. HEENT: [place]/AT. Sclerae  anicteric. EOMI. Cardiovascular: IRIR, no M/R/G.  Lungs: Respirations even and unlabored.  CTA bilaterally, No W/R/R. Abdomen: BS x 4, soft, NT/ND.  Musculoskeletal: No gross deformities, no edema.  Skin: Intact, warm, no rashes.  Labs/imaging personally reviewed:  CT chest 3/12 > negative for PNA but showed similar to slightly enlarged RUL nodule. Echo 3/13 >  LE duplex 3/13 >    Assessment & Plan:   Acute on probable hypoxic respiratory failure - presumed exacerbated by AFRVR, unclear why he has had recurrence after DCCV. Low suspicion for DVT/PE given that he is chronically anticoagulated but still at some risk especially given his RUL nodule. - Continue supplemental O2 as needed to maintain SpO2 > 90%. - Needs ambulatory desaturation study. - Get LE duplex for completeness though suspect they'll be negative. - IS/mobilize as able.  COPD/emphysema - no hx to support exacerbation. - Start Anoro (formulary for [employer] which he is on at home). - Albuterol nebs PRN. - F/u as outpatient, has appointment 12/17/22.  RUL nodule. - Ultimately likely needs biopsy but needs to improve from acute illness first. Dr. Lamonte Sakai to discuss timing with pt further.  PAF - s/p DCCV 12/02/22. - Cardiology following, appreciate the assistance. - Continue Diltiazem, Metoprolol. - F/u on repeat echo.   Rest per primary team.   Best practice (evaluated daily):  Per primary team.  Labs   CBC: Recent Labs  Lab 12/07/22 2130 12/08/22 0156  WBC 8.8 10.5  NEUTROABS 5.7  --   HGB 15.2 14.2  HCT 46.3 43.1  MCV 88.5 87.6  PLT 332 [id number]    Basic Metabolic Panel: Recent Labs  Lab 12/07/22 2130 12/08/22 0156  NA 130* 132*  K 4.7 3.7  CL 97* 100  CO2 19* 20*  GLUCOSE 153* 183*  BUN 23 20  CREATININE 1.94* 2.07*  CALCIUM 8.9 8.7*   GFR: Estimated Creatinine Clearance: 36.5 mL/min (A) (by C-G formula based on SCr of 2.07 mg/dL (H)). Recent Labs  Lab 12/07/22 2130 12/08/22 0156  WBC 8.8  10.5    Liver Function Tests: No results for input(s): "AST", "ALT", "ALKPHOS", "BILITOT", "PROT", "ALBUMIN" in the last 168 hours. No results for input(s): "LIPASE", "AMYLASE" in the last 168 hours. No results for input(s): "AMMONIA" in the last 168 hours.  ABG    Component Value Date/Time   HCO3 26.5 12/15/2018 2133   TCO2 28 12/15/2018 2133   O2SAT 47.0 12/15/2018 2133     Coagulation Profile: No results for input(s): "INR", "PROTIME" in the last 168 hours.  Cardiac Enzymes: No results for input(s): "CKTOTAL", "CKMB", "CKMBINDEX", "TROPONINI" in the last 168 hours.  HbA1C: Hgb A1c MFr Bld  Date/Time Value Ref Range Status  08/20/2019 09:04 AM 8.7 (H) 4.8 - 5.6 % Final    Comment:    (NOTE) Pre diabetes:          5.7%-6.4% Diabetes:              >6.4% Glycemic control for   <7.0% adults with diabetes   12/17/2018 05:00 AM 10.6 (H) 4.8 - 5.6 % Final    Comment:    (NOTE) Pre diabetes:          5.7%-6.4% Diabetes:              >6.4% Glycemic control for   <7.0% adults with diabetes  CBG: Recent Labs  Lab 12/02/22 0807 12/08/22 0109 12/08/22 0806 12/08/22 1315  GLUCAP 98 180* 216* 251*    Review of Systems:   All negative; except for those that are bolded, which indicate positives.  Constitutional: weight loss, weight gain, night sweats, fevers, chills, fatigue, weakness.  HEENT: headaches, sore throat, sneezing, nasal congestion, post nasal drip, difficulty swallowing, tooth/dental problems, visual complaints, visual changes, ear aches. Neuro: difficulty with speech, weakness, numbness, ataxia. CV:  chest pain, orthopnea, PND, swelling in lower extremities, dizziness, palpitations, syncope.  Resp: cough, hemoptysis, dyspnea, wheezing. GI: heartburn, indigestion, abdominal pain, nausea, vomiting, diarrhea, constipation, change in bowel habits, loss of appetite, hematemesis, melena, hematochezia.  GU: dysuria, change in color of urine, urgency or  frequency, flank pain, hematuria. MSK: joint pain or swelling, decreased range of motion. Psych: change in mood or affect, depression, anxiety, suicidal ideations, homicidal ideations. Skin: rash, itching, bruising.   Past Medical History:  He,  has a past medical history of A-fib ([place]) (03/20/2018), Acute blood loss anemia, Benign prostatic hyperplasia with urinary retention, Cerebrovascular accident (CVA) ([place]), Diabetes mellitus type 2 in nonobese [facility]), Essential hypertension (03/20/2018), Former tobacco use, H/O ulcerative colitis (1993), Leukocytosis, Osteomyelitis ([place]), PAF (paroxysmal atrial fibrillation) ([place]), Stage 3 chronic kidney disease ([place]), and Status post transmetatarsal amputation of left foot ([place]).   Surgical History:   Past Surgical History:  Procedure Laterality Date   AMPUTATION Left 03/24/2018   Procedure: Transmetatarsal Amputation Left Foot;  Surgeon: [name], MD;  Location: [place];  Service: Orthopedics;  Laterality: Left;   CARDIOVERSION N/A 05/05/2018   Procedure: CARDIOVERSION;  Surgeon: [name], MD;  Location: [facility];  Service: Cardiovascular;  Laterality: N/A;   CARDIOVERSION N/A 12/02/2022   Procedure: CARDIOVERSION;  Surgeon: Buford Dresser, MD;  Location: [facility] ENDOSCOPY;  Service: Cardiovascular;  Laterality: N/A;   COLONOSCOPY  12/2017   benign, remission from ulcerative colitis in 90s   I & D EXTREMITY Left 03/17/2018   Procedure: [place];  Surgeon: [name], MD;  Location: [facility];  Service: Orthopedics;  Laterality: Left;   STUMP REVISION Left 05/10/2018   Procedure: REVISION LEFT TRANSMETATARSAL AMPUTATION;  Surgeon: [name], MD;  Location: [facility];  Service: Orthopedics;  Laterality: Left;     Social History:   reports that he quit smoking about 39 years ago. His smoking use included cigarettes. He started smoking about 61 years ago. He has a 20.00 pack-year smoking history. He has  never used smokeless tobacco. He reports current alcohol use of about 4.0 standard drinks of alcohol per week. He reports that he does not use drugs.   Family History:  His family history includes Hypertension in his father.   Allergies No Known Allergies   Home Medications  Prior to Admission medications   Medication Sig Start Date End Date Taking? Authorizing Provider  albuterol (VENTOLIN HFA) 108 (90 Base) MCG/ACT inhaler Inhale 2 puffs into the lungs every 6 (six) hours as needed for wheezing or shortness of breath. 06/03/20   Brand Males, MD  apixaban (ELIQUIS) 5 MG TABS tablet Take 1 tablet (5 mg total) by mouth 2 (two) times daily. 07/14/22   Burnell Blanks, MD  diltiazem (CARDIZEM CD) 120 MG 24 hr capsule Take 1 capsule (120 mg total) by mouth daily. 10/27/22   Burnell Blanks, MD  empagliflozin (JARDIANCE) 10 MG TABS tablet Take 10 mg by mouth daily.    [provider]  escitalopram [place])  10 MG tablet Take 10 mg by mouth in the morning. 04/06/22   [provider]  fluconazole (DIFLUCAN) 200 MG tablet Take 200 mg by mouth daily. 11/25/22   [provider]  furosemide (LASIX) 20 MG tablet TAKE 1 TABLET DAILY. YOU   MAY TAKE AN EXTRA '20MG'$      TABLET FOR INCREASED       SWELLING OR WEIGHT GAIN 07/14/22   Burnell Blanks, MD  insulin glargine (LANTUS) 100 UNIT/ML injection Inject 20-38 Units into the skin 2 (two) times daily.    [provider]  loratadine (CLARITIN) 10 MG tablet Take 10 mg by mouth at bedtime.    [provider]  mesalamine (LIALDA) 1.2 g EC tablet Take 1.2 g by mouth 2 (two) times daily. 05/07/20   [provider]  metoprolol succinate (TOPROL XL) 50 MG 24 hr tablet Hold this medication until you have follow up with your cardiology provider. 12/02/22   Buford Dresser, MD  Multiple Vitamin (MULTIVITAMIN WITH MINERALS) TABS tablet Take 1 tablet by mouth daily.    [provider]  Multiple Vitamins-Minerals (PRESERVISION AREDS 2 PO) Take 1 tablet by mouth daily.    [provider]  oxybutynin (DITROPAN) 5 MG tablet Take 5 mg by mouth at bedtime.    [provider]  rosuvastatin (CRESTOR) 10 MG tablet Take 0.5 tablets (5 mg total) by mouth daily. 10/30/21   Burnell Blanks, MD  tamsulosin (FLOMAX) 0.4 MG CAPS capsule Take 1 capsule (0.4 mg total) by mouth 2 times daily at 12 noon and 4 pm. 03/31/18   Love, Ivan Anchors, PA-C  Tiotropium Bromide-Olodaterol (STIOLTO RESPIMAT) 2.5-2.5 MCG/ACT AERS Inhale 2 puffs into the lungs daily. 10/30/21   Parrett, Fonnie Mu, NP  trimethoprim (TRIMPEX) 100 MG tablet Take 100 mg by mouth at bedtime.  08/02/18   [provider]      Montey Hora, [place] For pager details, please see AMION or use Epic chat  After 1900, please call [facility] for cross coverage needs 12/08/2022, 2:47 PM

## 2022-12-08 NOTE — Assessment & Plan Note (Addendum)
-  Patient with known chronic periodic hypoxia for which he uses home O2 on a prn basis but not regularly -He has been persistently SOB, particularly with exertion -O2 sat 81% at home, 89% here -He underwent recent DCCV (3/7) and so thought on presentation was that this was related to CHF -Negative CXR, negative COVID, no real symptoms other than SOB -No DVT on imaging -He was given Lasix but appears to be euvolemic so will not continue -Repeat echo with preserved EF, ?RV overload/failure -Cardiology has consulted -Not c/w COPD exacerbation either given lack of cough -Instead, I am concerned that this SOB is related to his recently diagnosed lung mass and underlying COPD that needs continuous rather than prn home O2 -Dr. Lamonte Sakai has consulted and will see next week to further discuss possible biopsy

## 2022-12-08 NOTE — Progress Notes (Signed)
*  PRELIMINARY RESULTS* Echocardiogram 2D Echocardiogram has been performed.  Elpidio Anis 12/08/2022, 11:59 AM

## 2022-12-08 NOTE — Consult Note (Addendum)
[facility] Nurse Consult Note: Reason for Consult: Consult requested for left foot.  Performed remotely after review of progress notes and photo in the EMR. MRI is pending to r/o osteomyelitis. Left plantar foot with chronic full thickness wound, brown dry wound bed, surrounded by yellow slightly raised callous.  Dressing procedure/placement/frequency: Topical treatment orders provided for bedside nurses to perform as follows to assist with removal of nonviable tissue: Medihoney to left foot, change Q 3 days or PRN soiling. Cover with foam dressings, change foam dressings Q 3 days or PRN soiling.  Please re-consult if further assistance is needed.  Thank-you,  Julien Girt MSN, [place], [place], [place], [place]

## 2022-12-09 ENCOUNTER — Ambulatory Visit: Payer: Medicare Other | Admitting: Orthopedic Surgery

## 2022-12-09 ENCOUNTER — Inpatient Hospital Stay (HOSPITAL_COMMUNITY): Payer: Medicare Other

## 2022-12-09 ENCOUNTER — Other Ambulatory Visit (HOSPITAL_COMMUNITY): Payer: Self-pay

## 2022-12-09 DIAGNOSIS — I2510 Atherosclerotic heart disease of native coronary artery without angina pectoris: Secondary | ICD-10-CM | POA: Diagnosis present

## 2022-12-09 DIAGNOSIS — R0602 Shortness of breath: Secondary | ICD-10-CM | POA: Diagnosis not present

## 2022-12-09 DIAGNOSIS — J9621 Acute and chronic respiratory failure with hypoxia: Secondary | ICD-10-CM | POA: Diagnosis not present

## 2022-12-09 LAB — CBC WITH DIFFERENTIAL/PLATELET
Abs Immature Granulocytes: 0.07 10*3/uL (ref 0.00–0.07)
Basophils Absolute: 0.1 10*3/uL (ref 0.0–0.1)
Basophils Relative: 1 %
Eosinophils Absolute: 0.1 10*3/uL (ref 0.0–0.5)
Eosinophils Relative: 1 %
HCT: 47.9 % (ref 39.0–52.0)
Hemoglobin: 16 g/dL (ref 13.0–17.0)
Immature Granulocytes: 1 %
Lymphocytes Relative: 18 %
Lymphs Abs: 2.1 10*3/uL (ref 0.7–4.0)
MCH: 29.4 pg (ref 26.0–34.0)
MCHC: 33.4 g/dL (ref 30.0–36.0)
MCV: 87.9 fL (ref 80.0–100.0)
Monocytes Absolute: 1.7 10*3/uL — ABNORMAL HIGH (ref 0.1–1.0)
Monocytes Relative: 15 %
Neutro Abs: 7.4 10*3/uL (ref 1.7–7.7)
Neutrophils Relative %: 64 %
Platelets: 375 10*3/uL (ref 150–400)
RBC: 5.45 MIL/uL (ref 4.22–5.81)
RDW: 15.2 % (ref 11.5–15.5)
WBC: 11.4 10*3/uL — ABNORMAL HIGH (ref 4.0–10.5)
nRBC: 0 % (ref 0.0–0.2)

## 2022-12-09 LAB — URINALYSIS, ROUTINE W REFLEX MICROSCOPIC
Bilirubin Urine: NEGATIVE
Glucose, UA: 150 mg/dL — AB
Ketones, ur: NEGATIVE mg/dL
Nitrite: NEGATIVE
Protein, ur: 100 mg/dL — AB
Specific Gravity, Urine: 1.01 (ref 1.005–1.030)
WBC, UA: >50 WBC/hpf (ref 0–5)
pH: 5 (ref 5.0–8.0)

## 2022-12-09 LAB — BASIC METABOLIC PANEL
Anion gap: 12 (ref 5–15)
BUN: 30 mg/dL — ABNORMAL HIGH (ref 8–23)
CO2: 22 mmol/L (ref 22–32)
Calcium: 8.8 mg/dL — ABNORMAL LOW (ref 8.9–10.3)
Chloride: 96 mmol/L — ABNORMAL LOW (ref 98–111)
Creatinine, Ser: 2.57 mg/dL — ABNORMAL HIGH (ref 0.61–1.24)
GFR, Estimated: 25 mL/min — ABNORMAL LOW (ref >60)
Glucose, Bld: 171 mg/dL — ABNORMAL HIGH (ref 70–99)
Potassium: 4.4 mmol/L (ref 3.5–5.1)
Sodium: 130 mmol/L — ABNORMAL LOW (ref 135–145)

## 2022-12-09 LAB — MAGNESIUM: Magnesium: 2.2 mg/dL (ref 1.7–2.4)

## 2022-12-09 LAB — GLUCOSE, CAPILLARY
Glucose-Capillary: 262 mg/dL — ABNORMAL HIGH (ref 70–99)
Glucose-Capillary: 249 mg/dL — ABNORMAL HIGH (ref 70–99)
Glucose-Capillary: 281 mg/dL — ABNORMAL HIGH (ref 70–99)
Glucose-Capillary: 196 mg/dL — ABNORMAL HIGH (ref 70–99)

## 2022-12-09 MED ORDER — METOPROLOL TARTRATE 12.5 MG HALF TABLET
12.5000 mg | ORAL_TABLET | Freq: Once | ORAL | Status: AC
  Administered 2022-12-09: 12.5 mg via ORAL
  Filled 2022-12-09: qty 1

## 2022-12-09 MED ORDER — DOXYCYCLINE HYCLATE 100 MG PO TABS
100.0000 mg | ORAL_TABLET | Freq: Two times a day (BID) | ORAL | Status: DC
  Administered 2022-12-09 – 2022-12-13 (×8): 100 mg via ORAL
  Filled 2022-12-09 (×8): qty 1

## 2022-12-09 MED ORDER — INSULIN GLARGINE-YFGN 100 UNIT/ML ~~LOC~~ SOLN
12.0000 [IU] | Freq: Two times a day (BID) | SUBCUTANEOUS | Status: DC
  Administered 2022-12-09 – 2022-12-11 (×4): 12 [IU] via SUBCUTANEOUS
  Filled 2022-12-09 (×5): qty 0.12

## 2022-12-09 MED ORDER — AMOXICILLIN-POT CLAVULANATE 500-125 MG PO TABS
1.0000 | ORAL_TABLET | Freq: Two times a day (BID) | ORAL | Status: DC
  Administered 2022-12-09 – 2022-12-11 (×4): 1 via ORAL
  Filled 2022-12-09 (×5): qty 1

## 2022-12-09 MED ORDER — METOPROLOL TARTRATE 50 MG PO TABS
50.0000 mg | ORAL_TABLET | Freq: Two times a day (BID) | ORAL | Status: DC
  Administered 2022-12-09 – 2022-12-13 (×7): 50 mg via ORAL
  Filled 2022-12-09 (×8): qty 1

## 2022-12-09 NOTE — Hospital Course (Addendum)
Patient with h/o HTN, chronic diastolic CHF, afib on Eliquis s/p DCCV on 3/7, DM, prior CVA, stage 3b CKD, HLD, COPD, PVD s/p L TMA, and recent RUL lung mass who presenting with SOB. Home O2 sat 81% on RA, improved to 90% with home O2. No apparent evidence of infection or CHF on presentation.  Diuresed and euvolemic when seen by cardiology on 3/13.  Pulmonology also consulted.  Back in afib despite recent DCCV, possibly related to COPD and increasing O2 requirements, has home O2 that he doesn't use so needs to use it regularly. He is planned for possible biopsy of his lung mass as an outpatient.  Urine is quite cloudy, UA ordered with reflex.  Foot ulcer noted, wound care consulted and MRI ordered and positive for osteomyelitis of the medial cuneiform.  Discussed with [name] Given - he will need home antibiotics and f/u next week as scheduled for discussion about BKA.

## 2022-12-09 NOTE — Progress Notes (Signed)
Patient needing to do an ambulatory pulse ox test. Currently refusing to complete. States he is tired today and will do it tomorrow.

## 2022-12-09 NOTE — Progress Notes (Signed)
Progress Note  Patient Name: Kevin [name] Date of Encounter: 12/09/2022  Primary Cardiologist: Lauree Chandler, MD   Subjective   Patient seen examined his bedside.  He offers no complaints at this time.  Shortness of breath is improving.  Telemetry he is back in atrial fibrillation with heart rates in the low 100s.  Inpatient Medications    Scheduled Meds:  apixaban  5 mg Oral BID   diltiazem  120 mg Oral Daily   furosemide  40 mg Oral Daily   insulin aspart  0-15 Units Subcutaneous TID PC & HS   leptospermum manuka honey  1 Application Topical Daily   mesalamine  1.2 g Oral BID   metoprolol tartrate  25 mg Oral BID   oxybutynin  5 mg Oral QHS   rosuvastatin  5 mg Oral Daily   tamsulosin  0.4 mg Oral q12n4p   trimethoprim  100 mg Oral QHS   umeclidinium-vilanterol  1 puff Inhalation Daily   Continuous Infusions:  PRN Meds: albuterol, melatonin   Vital Signs    Vitals:   12/08/22 2350 12/09/22 0331 12/09/22 0500 12/09/22 0812  BP: 102/67 115/76  116/73  Pulse: 94 99  90  Resp: '19 19  16  '$ Temp: 97.8 F (36.6 C) 98.5 F (36.9 C)  99.3 F (37.4 C)  TempSrc: Oral Oral  Oral  SpO2: 96% 92%  91%  Weight:   95.1 kg   Height:        Intake/Output Summary (Last 24 hours) at 12/09/2022 0950 Last data filed at 12/09/2022 0926 Gross per 24 hour  Intake 360 ml  Output 1450 ml  Net -1090 ml   Filed Weights   12/07/22 2137 12/09/22 0500  Weight: 99.8 kg 95.1 kg    Telemetry    Atrial fibrillation with rapid ventricular rate- Personally Reviewed  ECG    None today- Personally Reviewed  Physical Exam    General: Comfortable Head: Atraumatic, normal size  Eyes: PEERLA, EOMI  Neck: Supple, normal JVD Cardiac: Normal S1, S2; RRR; no murmurs, rubs, or gallops Lungs: Clear to auscultation bilaterally Abd: Soft, nontender, no hepatomegaly  Ext: warm, no edema Musculoskeletal: No deformities, BUE and BLE strength normal and equal Skin: Warm and  dry, no rashes   Neuro: Alert and oriented to person, place, time, and situation, CNII-XII grossly intact, no focal deficits  Psych: Normal mood and affect   Labs    Chemistry Recent Labs  Lab 12/07/22 2130 12/08/22 0156 12/09/22 0133  NA 130* 132* 130*  K 4.7 3.7 4.4  CL 97* 100 96*  CO2 19* 20* 22  GLUCOSE 153* 183* 171*  BUN 23 20 30*  CREATININE 1.94* 2.07* 2.57*  CALCIUM 8.9 8.7* 8.8*  GFRNONAA 35* 32* 25*  ANIONGAP '14 12 12     '$ Hematology Recent Labs  Lab 12/07/22 2130 12/08/22 0156 12/09/22 0133  WBC 8.8 10.5 11.4*  RBC 5.23 4.92 5.45  HGB 15.2 14.2 16.0  HCT 46.3 43.1 47.9  MCV 88.5 87.6 87.9  MCH 29.1 28.9 29.4  MCHC 32.8 32.9 33.4  RDW 15.1 15.3 15.2  PLT 332 327 375    Cardiac EnzymesNo results for input(s): "TROPONINI" in the last 168 hours. No results for input(s): "TROPIPOC" in the last 168 hours.   BNP Recent Labs  Lab 12/07/22 2130  BNP 475.5*     DDimer No results for input(s): "DDIMER" in the last 168 hours.   Radiology    MR FOOT LEFT  W WO CONTRAST  Result Date: 12/08/2022 CLINICAL DATA:  Plantar foot ulcer.  History of prior amputations. EXAM: MRI OF THE LEFT FOREFOOT WITHOUT AND WITH CONTRAST TECHNIQUE: Multiplanar, multisequence MR imaging of the left foot was performed both before and after administration of intravenous contrast. 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 There is a deep open wound on the plantar aspect of the foot extending down to the bone. Abnormal STIR signal intensity and enhancement in the medial cuneiform consistent with osteomyelitis. No findings suspicious for septic arthritis. No drainable soft tissue abscess or pyomyositis. IMPRESSION: 1. Deep open wound on the plantar aspect of the foot extending down to the bone. 2. Osteomyelitis involving the medial cuneiform. 3. No findings for  septic arthritis or soft tissue abscess. Electronically Signed   By: PMarijo SanesM.D.   On: 12/08/2022 15:58   ECHOCARDIOGRAM COMPLETE  Result Date: 12/08/2022    ECHOCARDIOGRAM REPORT   Patient Name:   Kevin MACINNESDate of Exam: 12/08/2022 Medical Rec #:  [id number]          Height:       74.0 in Accession #:    [id number]         Weight:       220.0 lb Date of Birth:  107/06/1941         BSA:          2.263 m Patient Age:    89years            BP:           116/81 mmHg Patient Gender: M                   HR:           81 bpm. Exam Location:  Inpatient Procedure: 2D Echo, Cardiac Doppler and Color Doppler Indications:    CHF  History:        Patient has prior history of Echocardiogram examinations, most                 recent 04/24/2020. CHF, COPD and Stroke, Arrythmias:Atrial                 Fibrillation and Tachycardia; Risk Factors:Hypertension,                 Diabetes, Dyslipidemia and Former Smoker.  Sonographer:    DWenda LowReferring Phys: [id number]  Sonographer Comments: Technically difficult study due to poor echo windows. Image acquisition challenging due to respiratory motion. IMPRESSIONS  1. Left ventricular ejection fraction, by estimation, is 55 to 60%. The left ventricle has normal function. The left ventricle has no regional wall motion abnormalities. Left ventricular diastolic parameters are indeterminate. There is the interventricular septum is flattened in systole, suggestive of right ventricular pressure overload.  2. Right ventricular systolic function is mildly reduced. The right ventricular size is moderately enlarged. There is normal pulmonary artery systolic pressure. The estimated right ventricular systolic pressure is [id number].  3. Left atrial size was mildly dilated.  4. Right atrial size was moderately dilated.  5. The mitral valve is grossly normal. Trivial mitral valve regurgitation. No evidence of mitral stenosis.  6. The aortic valve is tricuspid.  There is mild calcification of the aortic valve. There is mild thickening of the aortic valve. Aortic valve  regurgitation is not visualized. No aortic stenosis is present.  7. The inferior vena cava is normal in size with greater than 50% respiratory variability, suggesting right atrial pressure of 3 mmHg. FINDINGS  Left Ventricle: Left ventricular ejection fraction, by estimation, is 55 to 60%. The left ventricle has normal function. The left ventricle has no regional wall motion abnormalities. The left ventricular internal cavity size was normal in size. There is  no left ventricular hypertrophy. The interventricular septum is flattened in systole, consistent with right ventricular pressure overload. Left ventricular diastolic parameters are indeterminate. Right Ventricle: The right ventricular size is moderately enlarged. No increase in right ventricular wall thickness. Right ventricular systolic function is mildly reduced. There is normal pulmonary artery systolic pressure. The tricuspid regurgitant velocity is 2.62 m/s, and with an assumed right atrial pressure of 3 mmHg, the estimated right ventricular systolic pressure is [id number] mmHg. Left Atrium: Left atrial size was mildly dilated. Right Atrium: Right atrial size was moderately dilated. Pericardium: There is no evidence of pericardial effusion. Mitral Valve: The mitral valve is grossly normal. Trivial mitral valve regurgitation. No evidence of mitral valve stenosis. MV peak gradient, 2.8 mmHg. The mean mitral valve gradient is 1.0 mmHg. Tricuspid Valve: The tricuspid valve is normal in structure. Tricuspid valve regurgitation is mild . No evidence of tricuspid stenosis. Aortic Valve: The aortic valve is tricuspid. There is mild calcification of the aortic valve. There is mild thickening of the aortic valve. Aortic valve regurgitation is not visualized. No aortic stenosis is present. Aortic valve mean gradient measures 1.0 mmHg. Aortic valve peak gradient  measures 2.7 mmHg. Aortic valve area, by VTI measures 2.78 cm. Pulmonic Valve: The pulmonic valve was normal in structure. Pulmonic valve regurgitation is not visualized. No evidence of pulmonic stenosis. Aorta: The aortic root is normal in size and structure. Ascending aorta measurements are within normal limits for age when indexed to body surface area. Venous: The inferior vena cava is normal in size with greater than 50% respiratory variability, suggesting right atrial pressure of 3 mmHg. IAS/Shunts: No atrial level shunt detected by color flow Doppler.  LEFT VENTRICLE PLAX 2D LVIDd:         4.60 cm LVIDs:         3.30 cm LV PW:         0.90 cm LV IVS:        1.00 cm LVOT diam:     2.00 cm LV SV:         41 LV SV Index:   18 LVOT Area:     3.14 cm  RIGHT VENTRICLE RV Basal diam:  5.00 cm RV Mid diam:    4.20 cm RV S prime:     10.20 cm/s TAPSE (M-mode): 1.8 cm LEFT ATRIUM             Index        RIGHT ATRIUM           Index LA diam:        4.90 cm 2.16 cm/m   RA Area:     37.10 cm LA Vol (A2C):   81.3 ml 35.92 ml/m  RA Volume:   141.00 ml 62.30 ml/m LA Vol (A4C):   93.8 ml 41.44 ml/m LA Biplane Vol: 88.1 ml 38.93 ml/m  AORTIC VALVE                    PULMONIC VALVE AV Area (Vmax):    2.60 cm  PV Vmax:       0.93 m/s AV Area (Vmean):   2.55 cm     PV Peak grad:  3.4 mmHg AV Area (VTI):     2.78 cm AV Vmax:           82.00 cm/s AV Vmean:          50.100 cm/s AV VTI:            0.146 m AV Peak Grad:      2.7 mmHg AV Mean Grad:      1.0 mmHg LVOT Vmax:         67.90 cm/s LVOT Vmean:        40.700 cm/s LVOT VTI:          0.129 m LVOT/AV VTI ratio: 0.88  AORTA Ao Root diam: 3.70 cm MITRAL VALVE               TRICUSPID VALVE MV Area (PHT): 3.97 cm    TR Peak grad:   27.5 mmHg MV Area VTI:   1.90 cm    TR Vmax:        262.00 cm/s MV Peak grad:  2.8 mmHg MV Mean grad:  1.0 mmHg    SHUNTS MV Vmax:       0.83 m/s    Systemic VTI:  0.13 m MV Vmean:      43.1 cm/s   Systemic Diam: 2.00 cm MV Decel Time: 191  msec MV E velocity: 85.90 cm/s Cherlynn Kaiser MD Electronically signed by Cherlynn Kaiser MD Signature Date/Time: 12/08/2022/2:15:48 PM    Final    CT CHEST WO CONTRAST  Result Date: 12/08/2022 CLINICAL DATA:  Pneumonia. Complication suspected. * Tracking Code: BO * EXAM: CT CHEST WITHOUT CONTRAST TECHNIQUE: Multidetector CT imaging of the chest was performed following the standard protocol without IV contrast. RADIATION DOSE REDUCTION: This exam was performed according to the departmental dose-optimization program which includes automated exposure control, adjustment of the mA and/or kV according to patient size and/or use of iterative reconstruction technique. COMPARISON:  11/03/2022 CT.  Plain film of 1 day prior FINDINGS: Cardiovascular: Aortic atherosclerosis. Tortuous thoracic aorta. Mild cardiomegaly, with trace anterior pericardial fluid or thickening. Left main and 3 vessel coronary artery calcification. Mediastinum/Nodes: Multiple small middle mediastinal nodes are similar, none pathologic by size criteria. Hilar regions poorly evaluated without intravenous contrast. Fluid or debris level within the esophagus on 75/3. Lungs/Pleura: No pleural fluid.  Mild centrilobular emphysema. Irregular right upper lobe pulmonary nodule measures 2.0 x 1.4 cm on 56/4 versus similar on the prior exam when measured in a similar fashion. This is contiguous with an area of more linear, bandlike opacity which is also unchanged. Relatively linear posterior left upper lobe density of maximally 1.8 cm on 66/4 is similar to on the prior exam (when remeasured). Presumed subpleural lymph node about the posterior left major fissure at 7 mm on 91/4 is unchanged. No evidence of pneumonia. Upper Abdomen: Normal imaged portions of the liver, spleen, stomach, left kidney. Minimal nodularity within the left adrenal gland. A right adrenal 1.4 cm nodule measures 6.2 HU, consistent with an adenoma. Musculoskeletal: Remote right  clavicular fracture. Remote posttraumatic deformity of the L1 vertebral body. IMPRESSION: 1. No evidence of pneumonia. 2. No significant change from 11/03/2022 chest CT. 3. Similar right upper lobe pulmonary nodule which had enlarged on that exam and is suspicious for primary bronchogenic carcinoma. Smaller left-sided pulmonary nodules are unchanged. 4. Aortic atherosclerosis (ICD10-I70.0), coronary artery  atherosclerosis and emphysema (ICD10-J43.9). 5. Right adrenal adenoma . In the absence of clinically indicated signs/symptoms require(s) no independent follow-up. 6. Esophageal air fluid level suggests dysmotility or gastroesophageal reflux. Electronically Signed   By: Abigail Miyamoto M.D.   On: 12/08/2022 09:51   DG Chest Port 1 View  Result Date: 12/07/2022 CLINICAL DATA:  Shortness of breath EXAM: PORTABLE CHEST 1 VIEW COMPARISON:  Chest x-ray 08/21/2019 FINDINGS: The heart size and mediastinal contours are within normal limits. There is a focal opacity in the right upper lobe which is slightly linear. The lungs are otherwise clear. There is no pleural effusion or pneumothorax. The visualized skeletal structures are unremarkable. IMPRESSION: Focal opacity in the right upper lobe which is slightly linear. This could be due to atelectasis or scarring. Recommend follow-up chest x-ray in 4-6 weeks to ensure resolution. Electronically Signed   By: Ronney Asters M.D.   On: 12/07/2022 22:07    Cardiac Studies   TTE 12/08/2022 IMPRESSIONS     1. Left ventricular ejection fraction, by estimation, is 55 to 60%. The  left ventricle has normal function. The left ventricle has no regional  wall motion abnormalities. Left ventricular diastolic parameters are  indeterminate. There is the  interventricular septum is flattened in systole, suggestive of right  ventricular pressure overload.   2. Right ventricular systolic function is mildly reduced. The right  ventricular size is moderately enlarged. There is  normal pulmonary artery  systolic pressure. The estimated right ventricular systolic pressure is  [id number] mmHg.   3. Left atrial size was mildly dilated.   4. Right atrial size was moderately dilated.   5. The mitral valve is grossly normal. Trivial mitral valve  regurgitation. No evidence of mitral stenosis.   6. The aortic valve is tricuspid. There is mild calcification of the  aortic valve. There is mild thickening of the aortic valve. Aortic valve  regurgitation is not visualized. No aortic stenosis is present.   7. The inferior vena cava is normal in size with greater than 50%  respiratory variability, suggesting right atrial pressure of 3 mmHg.   FINDINGS   Left Ventricle: Left ventricular ejection fraction, by estimation, is 55  to 60%. The left ventricle has normal function. The left ventricle has no  regional wall motion abnormalities. The left ventricular internal cavity  size was normal in size. There is   no left ventricular hypertrophy. The interventricular septum is flattened  in systole, consistent with right ventricular pressure overload. Left  ventricular diastolic parameters are indeterminate.   Right Ventricle: The right ventricular size is moderately enlarged. No  increase in right ventricular wall thickness. Right ventricular systolic  function is mildly reduced. There is normal pulmonary artery systolic  pressure. The tricuspid regurgitant  velocity is 2.62 m/s, and with an assumed right atrial pressure of 3 mmHg,  the estimated right ventricular systolic pressure is [id number] mmHg.   Left Atrium: Left atrial size was mildly dilated.   Right Atrium: Right atrial size was moderately dilated.   Pericardium: There is no evidence of pericardial effusion.   Mitral Valve: The mitral valve is grossly normal. Trivial mitral valve  regurgitation. No evidence of mitral valve stenosis. MV peak gradient, 2.8  mmHg. The mean mitral valve gradient is 1.0 mmHg.   Tricuspid  Valve: The tricuspid valve is normal in structure. Tricuspid  valve regurgitation is mild . No evidence of tricuspid stenosis.   Aortic Valve: The aortic valve is tricuspid. There is mild calcification  of the aortic valve. There is mild thickening of the aortic valve. Aortic  valve regurgitation is not visualized. No aortic stenosis is present.  Aortic valve mean gradient measures  1.0 mmHg. Aortic valve peak gradient measures 2.7 mmHg. Aortic valve area,  by VTI measures 2.78 cm.   Pulmonic Valve: The pulmonic valve was normal in structure. Pulmonic valve  regurgitation is not visualized. No evidence of pulmonic stenosis.   Aorta: The aortic root is normal in size and structure. Ascending aorta  measurements are within normal limits for age when indexed to body surface  area.   Venous: The inferior vena cava is normal in size with greater than 50%  respiratory variability, suggesting right atrial pressure of 3 mmHg.   IAS/Shunts: No atrial level shunt detected by color flow Doppler.   Patient Profile     82 y.o. male with history of heart failure with preserved ejection fraction, paroxysmal atrial fibrillation, diabetes, CKD stage IIIb, hyperlipidemia, COPD, left transmetatarsal amputation presenting and admitted for heart failure with preserved ejection fraction and acute exacerbation of COPD with acute respiratory failure with hypoxia.   Assessment & Plan    Paroxysmal atrial fibrillation Acute hypoxic respiratory failure Acute on chronic heart failure with preserved ejection fraction-resolved COPD exacerbation Lung cancer CKD stage IIIb  From the heart failure perspective he appears to be volume overloaded.  His echocardiogram which was done yesterday showed a normal EF, however right heart with mildly depressed ejection fraction and mild pulmonary hypertension.  Pulmonary hypertension likely in the setting of his COPD.  Will continue the oral Lasix for now.  In terms of  his atrial fibrillation, I think his driving force is the exacerbation of COPD here.  Hopefully once he has some improvement this will improve his heart rate as well as hopefully once his heart rate is better he will be able to spontaneously convert to sinus rhythm.  But for now we will continue with rate control strategy.  He is on Cardizem 120 mg daily and metoprolol 25 mg twice daily.  I will slightly go up on the metoprolol to 50 twice daily to see is going to help.  His blood pressure is slightly on the lower side so hold off on that given his chronic kidney disease.    For questions or updates, please contact [place] Please consult [email or web address] for contact info under Cardiology/STEMI.      Signed, Berniece Salines, DO  12/09/2022, 9:50 AM

## 2022-12-09 NOTE — Inpatient Diabetes Management (Signed)
Inpatient Diabetes Program Recommendations  AACE/ADA: New Consensus Statement on Inpatient Glycemic Control (2015)  Target Ranges:  Prepandial:   less than 140 mg/dL      Peak postprandial:   less than 180 mg/dL (1-2 hours)      Critically ill patients:  140 - 180 mg/dL   Lab Results  Component Value Date   GLUCAP 249 (H) 12/09/2022   HGBA1C 10.2 (H) 12/08/2022    Latest Reference Range & Units 12/08/22 13:15 12/08/22 16:50 12/08/22 21:54 12/09/22 06:42 12/09/22 12:21  Glucose-Capillary 70 - 99 mg/dL 251 (H) 114 (H) 253 (H) 262 (H) 249 (H)  (H): Data is abnormally high Review of Glycemic Control  Diabetes history: type 2 Outpatient Diabetes medications: Jardiance 10 mg daily Current orders for Inpatient glycemic control: Novolog 0-15 units correction scale TID & HS  Inpatient Diabetes Program Recommendations:   Noted that blood sugars have been greater than 180 mg/dl. If blood sugars continue to be elevated, recommend adding low dose basal insulin: Semglee 10 units daily.   Harvel Ricks RN BSN CDE Diabetes Coordinator Pager: [phone]  8am-5pm

## 2022-12-09 NOTE — Progress Notes (Signed)
VASCULAR LAB    Bilateral lower extremity venous duplex has been performed.  See CV proc for preliminary results.   Porcha Deblanc, RVT 12/09/2022, 12:13 PM

## 2022-12-09 NOTE — Assessment & Plan Note (Signed)
-  Appears to be compensated -He was diuresed on admission and now has mildly worse renal function -Will hold Lasix for now

## 2022-12-09 NOTE — Progress Notes (Signed)
Progress Note   Patient: Kevin [name] [id number] DOB: 09-04-1941 DOA: 12/07/2022     1 DOS: the patient was seen and examined on 12/09/2022   Brief hospital course: Patient with h/o HTN, chronic diastolic CHF, afib on Eliquis s/p DCCV on 3/7, DM, prior CVA, stage 3b CKD, HLD, COPD, PVD s/p L TMA, and recent RUL lung mass who presenting with SOB. Home O2 sat 81% on RA, improved to 90% with home O2. No apparent evidence of infection or CHF on presentation.  Diuresed and euvolemic when seen by cardiology on 3/13.  Pulmonology also consulted.  Back in afib despite recent DCCV, possibly related to COPD and increasing O2 requirements, has home O2 that he doesn't use so needs to use it regularly. He is planned for possible biopsy of his lung mass as an outpatient.  Urine is quite cloudy, UA ordered with reflex.  Foot ulcer noted, wound care consulted and MRI ordered and positive for osteomyelitis of the medial cuneiform.  Discussed with [name] Given - he will need home antibiotics and f/u next week as scheduled for discussion about BKA.  Assessment and Plan: * Acute on chronic respiratory failure with hypoxia (HCC) -Patient with known chronic periodic hypoxia for which he uses home O2 on a prn basis but not regularly -He has been persistently SOB, particularly with exertion -O2 sat 81% at home, 89% here -He underwent recent DCCV (3/7) and so thought on presentation was that this was related to CHF -Negative CXR, negative COVID, no real symptoms other than SOB -No DVT on imaging -He was given Lasix but appears to be euvolemic so will not continue -Repeat echo with preserved EF, ?RV overload/failure -Cardiology has consulted -Not c/w COPD exacerbation either given lack of cough -Instead, I am concerned that this SOB is related to his recently diagnosed lung mass and underlying COPD that needs continuous rather than prn home O2 -Dr. Lamonte Sakai has consulted and will see next week to further discuss  possible biopsy  CAD (coronary artery disease) -Seen on imaging -No chest pain reported during hospitalization  DNR (do not resuscitate) -I have discussed code status with the patient and he would not desire resuscitation and would prefer to die a natural death should that situation arise.   History of recurrent UTI (urinary tract infection) -Continue prophylactic trimethoprim prescribed by urology  Lung mass -Patient has been following with pulmonologist Dr. West Carbo outpatient for surveillance of a right upper lobe spiculated mass as well as left-sided pulmonary nodules. 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   -Has follow-up coming up with Dr. Lamonte Sakai to discuss further treatment but inpatient consult has been requested.  COPD (chronic obstructive pulmonary disease) (HCC) -No evidence of acute exacerbation at this time. -Continue Stiolto (Brovana and Incruse per formulary), Albuterol -He has home O2 but does not wear it regularly -This appears to be the primary reason for his current hospitalization, needs routine use of home O2 -Ambulatory pulse ox prior to dc -Patient and wife counseled -Has f/u with Dr. Lamonte Sakai on 3/22  Chronic kidney disease, stage 3b ([place]) -Slightly worse, likely related to diuresis -Attempt to avoid nephrotoxic medications -Hold Lasix for now -Was planning to dc on 3/14 but will keep one additional day, hold Lasix, and recheck renal function in AM  Chronic diastolic CHF (congestive heart failure) ([place]) -Appears to be compensated -He was diuresed on admission and now has mildly worse renal function -Will hold Lasix  for now  Insulin dependent type 2 diabetes mellitus ([place]) -A1c is 8.7, poor control -Resume Jardiance -He appears to need glargine -Consult to DM coordinator  Diabetic foot ulcer ([place]) -s/p L TMA -Has Wagoner ulcer along L anterior foot -Followed by [name] Given, last seen  about a month ago -MRI with osteo -Wound care consulted -I spoke with [name] Given - ok to treat with PO antibiotics (Augmentin, Doxy) and dc when appropriate -He has f/u scheduled for 1 week from now, 3/21 -He will need BKA  PAF (paroxysmal atrial fibrillation) ([place]) -Recent successful cardioversion on 3/7 outpatient with cardiology -In and out of afib during hospitalization -Recurrence is likely related to COPD/lung disease -Continue diltiazem -Metoprolol was held due to bradycardia immediately following his procedure but has been resumed without apparent difficulty -Continue Eliquis  Essential hypertension -Continue diltiazem -Resume beta blocker   Albuterol Lexparo Clarinin Eliquis Dilt Jardiance Lasix Mesalamine Toprol XL Ditropan Crestor Flomax Stiolto  Trimethoprim  Subjective: Feeling pretty well.  Has home O2 but has not been wearing it.  Feels good about going home when he is ready - understands need for home O2.  Not excited about ulcer and possibility of further amputation.  Physical Exam: Vitals:   12/09/22 0331 12/09/22 0500 12/09/22 0812 12/09/22 1041  BP: 115/76  116/73 109/73  Pulse: 99  90   Resp: 19  16   Temp: 98.5 F (36.9 C)  99.3 F (37.4 C)   TempSrc: Oral  Oral   SpO2: 92%  91%   Weight:  95.1 kg    Height:       General:  Appears calm and comfortable and is in NAD, on 3L [place] O2 Eyes:  EOMI, normal lids, iris ENT:  grossly normal hearing, lips & tongue, mmm Neck:  no LAD, masses or thyromegaly, no JVD Cardiovascular:  RR with mild tachycardia, no m/r/g. No LE edema.  Respiratory:   CTA bilaterally with no wheezes/rales/rhonchi.  Normal respiratory effort. Abdomen:  soft, NT, ND; + umbilical hernia (chronic) Skin:  ulcer along L plantar foot Musculoskeletal:  grossly normal tone BUE/BLE, good ROM, no bony abnormality, s/p L TMA Psychiatric:  grossly normal mood and affect, speech fluent and appropriate, AOx3 Neurologic:  CN 2-12 grossly  intact, moves all extremities in coordinated fashion   Radiological Exams on Admission: Independently reviewed - see discussion in A/P where applicable  VAS [place] LOWER EXTREMITY VENOUS (DVT)  Result Date: 12/09/2022  Lower Venous DVT Study Patient Name:  GREYSON SOBOTTA  Date of Exam:   12/09/2022 Medical Rec #: [id number]            Accession #:    [id number] Date of Birth: [birth year]           Patient Gender: M Patient Age:   62 years Exam Location:  [facility] Procedure:      VAS [place] LOWER EXTREMITY VENOUS (DVT) Referring Phys: Ane Payment DESAI --------------------------------------------------------------------------------  Indications: SOB.  Risk Factors: Paroxysmal atrial fibrillation, heart failure. Comparison Study: No prior study on file Performing Technologist: Sharion Dove RVS  Examination Guidelines: A complete evaluation includes B-mode imaging, spectral Doppler, color Doppler, and power Doppler as needed of all accessible portions of each vessel. Bilateral testing is considered an integral part of a complete examination. Limited examinations for reoccurring indications may be performed as noted. The reflux portion of the exam is performed with the patient in reverse Trendelenburg.  +---------+---------------+---------+-----------+----------+--------------+ RIGHT    CompressibilityPhasicitySpontaneityPropertiesThrombus Aging +---------+---------------+---------+-----------+----------+--------------+ CFV  Full           Yes      Yes                                 +---------+---------------+---------+-----------+----------+--------------+ SFJ      Full                                                        +---------+---------------+---------+-----------+----------+--------------+ FV Prox  Full                                                        +---------+---------------+---------+-----------+----------+--------------+ FV Mid   Full           Yes       Yes                                 +---------+---------------+---------+-----------+----------+--------------+ FV DistalFull                                                        +---------+---------------+---------+-----------+----------+--------------+ PFV      Full                                                        +---------+---------------+---------+-----------+----------+--------------+ POP      Full           Yes      Yes                                 +---------+---------------+---------+-----------+----------+--------------+ PTV      Full                                                        +---------+---------------+---------+-----------+----------+--------------+ PERO     Full                                                        +---------+---------------+---------+-----------+----------+--------------+   +---------+---------------+---------+-----------+----------+--------------+ LEFT     CompressibilityPhasicitySpontaneityPropertiesThrombus Aging +---------+---------------+---------+-----------+----------+--------------+ CFV      Full           Yes      Yes                                 +---------+---------------+---------+-----------+----------+--------------+  SFJ      Full                                                        +---------+---------------+---------+-----------+----------+--------------+ FV Prox  Full                                                        +---------+---------------+---------+-----------+----------+--------------+ FV Mid   Full           Yes      Yes                                 +---------+---------------+---------+-----------+----------+--------------+ FV DistalFull                                                        +---------+---------------+---------+-----------+----------+--------------+ PFV      Full                                                         +---------+---------------+---------+-----------+----------+--------------+ POP      Full           Yes      Yes                                 +---------+---------------+---------+-----------+----------+--------------+ PTV      Full                                                        +---------+---------------+---------+-----------+----------+--------------+ PERO     Full                                                        +---------+---------------+---------+-----------+----------+--------------+     Summary: BILATERAL: - No evidence of deep vein thrombosis seen in the lower extremities, bilaterally. -No evidence of popliteal cyst, bilaterally.   *See table(s) above for measurements and observations.    Preliminary    MR FOOT LEFT W WO CONTRAST  Result Date: 12/08/2022 CLINICAL DATA:  Plantar foot ulcer.  History of prior amputations. EXAM: MRI OF THE LEFT FOREFOOT WITHOUT AND WITH CONTRAST TECHNIQUE: Multiplanar, multisequence MR imaging of the left foot was performed both before and after administration of intravenous contrast. 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 There  is a deep open wound on the plantar aspect of the foot extending down to the bone. Abnormal STIR signal intensity and enhancement in the medial cuneiform consistent with osteomyelitis. No findings suspicious for septic arthritis. No drainable soft tissue abscess or pyomyositis. IMPRESSION: 1. Deep open wound on the plantar aspect of the foot extending down to the bone. 2. Osteomyelitis involving the medial cuneiform. 3. No findings for septic arthritis or soft tissue abscess. Electronically Signed   By: Marijo Sanes M.D.   On: 12/08/2022 15:58   ECHOCARDIOGRAM COMPLETE  Result Date: 12/08/2022    ECHOCARDIOGRAM REPORT   Patient Name:   PRESS GALESKI Date of Exam: 12/08/2022  Medical Rec #:  [id number]           Height:       74.0 in Accession #:    [id number]          Weight:       220.0 lb Date of Birth:  May 24, 1941          BSA:          2.263 m Patient Age:    20 years            BP:           116/81 mmHg Patient Gender: M                   HR:           81 bpm. Exam Location:  Inpatient Procedure: 2D Echo, Cardiac Doppler and Color Doppler Indications:    CHF  History:        Patient has prior history of Echocardiogram examinations, most                 recent 04/24/2020. CHF, COPD and Stroke, Arrythmias:Atrial                 Fibrillation and Tachycardia; Risk Factors:Hypertension,                 Diabetes, Dyslipidemia and Former Smoker.  Sonographer:    Wenda Low Referring Phys: [id number] Ava T TU  Sonographer Comments: Technically difficult study due to poor echo windows. Image acquisition challenging due to respiratory motion. IMPRESSIONS  1. Left ventricular ejection fraction, by estimation, is 55 to 60%. The left ventricle has normal function. The left ventricle has no regional wall motion abnormalities. Left ventricular diastolic parameters are indeterminate. There is the interventricular septum is flattened in systole, suggestive of right ventricular pressure overload.  2. Right ventricular systolic function is mildly reduced. The right ventricular size is moderately enlarged. There is normal pulmonary artery systolic pressure. The estimated right ventricular systolic pressure is [id number] mmHg.  3. Left atrial size was mildly dilated.  4. Right atrial size was moderately dilated.  5. The mitral valve is grossly normal. Trivial mitral valve regurgitation. No evidence of mitral stenosis.  6. The aortic valve is tricuspid. There is mild calcification of the aortic valve. There is mild thickening of the aortic valve. Aortic valve regurgitation is not visualized. No aortic stenosis is present.  7. The inferior vena cava is normal in size with greater than 50% respiratory  variability, suggesting right atrial pressure of 3 mmHg. FINDINGS  Left Ventricle: Left ventricular ejection fraction, by estimation, is 55 to 60%. The left ventricle has normal function. The left ventricle has no regional wall motion abnormalities. The left ventricular internal cavity size was normal in size. There is  no left ventricular hypertrophy. The interventricular septum is flattened in systole, consistent with right ventricular pressure overload. Left ventricular diastolic parameters are indeterminate. Right Ventricle: The right ventricular size is moderately enlarged. No increase in right ventricular wall thickness. Right ventricular systolic function is mildly reduced. There is normal pulmonary artery systolic pressure. The tricuspid regurgitant velocity is 2.62 m/s, and with an assumed right atrial pressure of 3 mmHg, the estimated right ventricular systolic pressure is [id number] mmHg. Left Atrium: Left atrial size was mildly dilated. Right Atrium: Right atrial size was moderately dilated. Pericardium: There is no evidence of pericardial effusion. Mitral Valve: The mitral valve is grossly normal. Trivial mitral valve regurgitation. No evidence of mitral valve stenosis. MV peak gradient, 2.8 mmHg. The mean mitral valve gradient is 1.0 mmHg. Tricuspid Valve: The tricuspid valve is normal in structure. Tricuspid valve regurgitation is mild . No evidence of tricuspid stenosis. Aortic Valve: The aortic valve is tricuspid. There is mild calcification of the aortic valve. There is mild thickening of the aortic valve. Aortic valve regurgitation is not visualized. No aortic stenosis is present. Aortic valve mean gradient measures 1.0 mmHg. Aortic valve peak gradient measures 2.7 mmHg. Aortic valve area, by VTI measures 2.78 cm. Pulmonic Valve: The pulmonic valve was normal in structure. Pulmonic valve regurgitation is not visualized. No evidence of pulmonic stenosis. Aorta: The aortic root is normal in size and  structure. Ascending aorta measurements are within normal limits for age when indexed to body surface area. Venous: The inferior vena cava is normal in size with greater than 50% respiratory variability, suggesting right atrial pressure of 3 mmHg. IAS/Shunts: No atrial level shunt detected by color flow Doppler.  LEFT VENTRICLE PLAX 2D LVIDd:         4.60 cm LVIDs:         3.30 cm LV PW:         0.90 cm LV IVS:        1.00 cm LVOT diam:     2.00 cm LV SV:         41 LV SV Index:   18 LVOT Area:     3.14 cm  RIGHT VENTRICLE RV Basal diam:  5.00 cm RV Mid diam:    4.20 cm RV S prime:     10.20 cm/s TAPSE (M-mode): 1.8 cm LEFT ATRIUM             Index        RIGHT ATRIUM           Index LA diam:        4.90 cm 2.16 cm/m   RA Area:     37.10 cm LA Vol (A2C):   81.3 ml 35.92 ml/m  RA Volume:   141.00 ml 62.30 ml/m LA Vol (A4C):   93.8 ml 41.44 ml/m LA Biplane Vol: 88.1 ml 38.93 ml/m  AORTIC VALVE                    PULMONIC VALVE AV Area (Vmax):    2.60 cm     PV Vmax:       0.93 m/s AV Area (Vmean):   2.55 cm     PV Peak grad:  3.4 mmHg AV Area (VTI):     2.78 cm AV Vmax:           82.00 cm/s AV Vmean:          50.100 cm/s AV VTI:  0.146 m AV Peak Grad:      2.7 mmHg AV Mean Grad:      1.0 mmHg LVOT Vmax:         67.90 cm/s LVOT Vmean:        40.700 cm/s LVOT VTI:          0.129 m LVOT/AV VTI ratio: 0.88  AORTA Ao Root diam: 3.70 cm MITRAL VALVE               TRICUSPID VALVE MV Area (PHT): 3.97 cm    TR Peak grad:   27.5 mmHg MV Area VTI:   1.90 cm    TR Vmax:        262.00 cm/s MV Peak grad:  2.8 mmHg MV Mean grad:  1.0 mmHg    SHUNTS MV Vmax:       0.83 m/s    Systemic VTI:  0.13 m MV Vmean:      43.1 cm/s   Systemic Diam: 2.00 cm MV Decel Time: 191 msec MV E velocity: 85.90 cm/s Cherlynn Kaiser MD Electronically signed by Cherlynn Kaiser MD Signature Date/Time: 12/08/2022/2:15:48 PM    Final    CT CHEST WO CONTRAST  Result Date: 12/08/2022 CLINICAL DATA:  Pneumonia. Complication suspected. *  Tracking Code: BO * EXAM: CT CHEST WITHOUT CONTRAST TECHNIQUE: Multidetector CT imaging of the chest was performed following the standard protocol without IV contrast. RADIATION DOSE REDUCTION: This exam was performed according to the departmental dose-optimization program which includes automated exposure control, adjustment of the mA and/or kV according to patient size and/or use of iterative reconstruction technique. COMPARISON:  11/03/2022 CT.  Plain film of 1 day prior FINDINGS: Cardiovascular: Aortic atherosclerosis. Tortuous thoracic aorta. Mild cardiomegaly, with trace anterior pericardial fluid or thickening. Left main and 3 vessel coronary artery calcification. Mediastinum/Nodes: Multiple small middle mediastinal nodes are similar, none pathologic by size criteria. Hilar regions poorly evaluated without intravenous contrast. Fluid or debris level within the esophagus on 75/3. Lungs/Pleura: No pleural fluid.  Mild centrilobular emphysema. Irregular right upper lobe pulmonary nodule measures 2.0 x 1.4 cm on 56/4 versus similar on the prior exam when measured in a similar fashion. This is contiguous with an area of more linear, bandlike opacity which is also unchanged. Relatively linear posterior left upper lobe density of maximally 1.8 cm on 66/4 is similar to on the prior exam (when remeasured). Presumed subpleural lymph node about the posterior left major fissure at 7 mm on 91/4 is unchanged. No evidence of pneumonia. Upper Abdomen: Normal imaged portions of the liver, spleen, stomach, left kidney. Minimal nodularity within the left adrenal gland. A right adrenal 1.4 cm nodule measures 6.2 HU, consistent with an adenoma. Musculoskeletal: Remote right clavicular fracture. Remote posttraumatic deformity of the L1 vertebral body. IMPRESSION: 1. No evidence of pneumonia. 2. No significant change from 11/03/2022 chest CT. 3. Similar right upper lobe pulmonary nodule which had enlarged on that exam and is  suspicious for primary bronchogenic carcinoma. Smaller left-sided pulmonary nodules are unchanged. 4. Aortic atherosclerosis (ICD10-I70.0), coronary artery atherosclerosis and emphysema (ICD10-J43.9). 5. Right adrenal adenoma . In the absence of clinically indicated signs/symptoms require(s) no independent follow-up. 6. Esophageal air fluid level suggests dysmotility or gastroesophageal reflux. Electronically Signed   By: Abigail Miyamoto M.D.   On: 12/08/2022 09:51   DG Chest Port 1 View  Result Date: 12/07/2022 CLINICAL DATA:  Shortness of breath EXAM: PORTABLE CHEST 1 VIEW COMPARISON:  Chest x-ray 08/21/2019 FINDINGS: The heart size and  mediastinal contours are within normal limits. There is a focal opacity in the right upper lobe which is slightly linear. The lungs are otherwise clear. There is no pleural effusion or pneumothorax. The visualized skeletal structures are unremarkable. IMPRESSION: Focal opacity in the right upper lobe which is slightly linear. This could be due to atelectasis or scarring. Recommend follow-up chest x-ray in 4-6 weeks to ensure resolution. Electronically Signed   By: Ronney Asters M.D.   On: 12/07/2022 22:07    EKG: Independently reviewed.  Afib with rate 86; prolonged QTc 502; RBBB, LAFB    Labs on Admission: I have personally reviewed the available labs and imaging studies at the time of the admission.  Pertinent labs:  Na++ 130 Glucose 171 BUN 30/Creatinine 2.57/GFR 25; 32/1.96/34 on 2/29 WBC 11.4    Family Communication: Wife was present for the majority of the evaluation  Disposition: Status is: Inpatient Remains inpatient appropriate because: slightly worse creatinine today, ?overdiuresis  Planned Discharge Destination: Home    Time spent: 50 minutes  Author: Karmen Bongo, MD 12/09/2022 2:53 PM  For on call review [email or web address].

## 2022-12-09 NOTE — Inpatient Diabetes Management (Addendum)
Inpatient Diabetes Program Recommendations  AACE/ADA: New Consensus Statement on Inpatient Glycemic Control (2015)  Target Ranges:  Prepandial:   less than 140 mg/dL      Peak postprandial:   less than 180 mg/dL (1-2 hours)      Critically ill patients:  140 - 180 mg/dL   Lab Results  Component Value Date   GLUCAP 249 (H) 12/09/2022   HGBA1C 10.2 (H) 12/08/2022    Review of Glycemic Control  Diabetes history: type 2 Outpatient Diabetes medications: Lantus 15-32 units BID (no scale to follow), Jardiance 10 mg daily Current orders for Inpatient glycemic control: Novolog 0-15 units correction scale TID & HS  Inpatient Diabetes Program Recommendations:   Spoke with patient and wife about his diabetes. He was diagnosed about 5-6 years ago. He has been only on Lantus for the past 2 years, but has to guess how much dosages he needs according to his blood sugars. He only eats 2 meals per day. Patient states that he does have low blood sugars from time to time. He sees Dr. Mindi Slicker, endocrinologist, for diabetes control.  Wife and patient are interested in having a scale to follow at home: Recommend Lantus twice a day and Novolog or Humalog with his meals which are x2 per day. Weight based dosage: 95 kg X 0.3 units/kg=28.5 units.  Recommend Lantus 12 units BID, Novolog SENSITIVE (0-9 units) correction scale BID (with meals) These dosages can be titrated by endo if needed.  *Benefit check per Lyndel Safe CPhT states that Novolog insulin pen copay is $35.   Patient has a CGM at home that was given to him by Dr. Leeroy Bock. This will be helpful in checking blood sugars without finger sticks.   Will continue to monitor blood sugars while in the hospital.  Harvel Ricks RN BSN CDE Diabetes Coordinator Pager: [phone]  8am-5pm

## 2022-12-09 NOTE — Assessment & Plan Note (Signed)
-  Seen on imaging -No chest pain reported during hospitalization

## 2022-12-09 NOTE — Progress Notes (Signed)
NAME:  Kevin [name], MRN:  [id number], DOB:  [birth year], LOS: 1 ADMISSION DATE:  12/07/2022, CONSULTATION DATE:  12/08/22 REFERRING MD:  Lorin Mercy CHIEF COMPLAINT:  Dyspnea   History of Present Illness:  Kevin [name] is a 82 y.o. male who has a PMH as below including but not limited to emphysema/COPD, RUL nodule, PAF on Eliquis, HFpEF, HTN, CKD, DM, CVA. He presented to [facility] ED 3/12 with dyspnea. He apparently had DCCV on 3/7 and had been doing fairly well immediately after. Around 3/9, he started feeling dyspnea mainly with exertion. This progressed and on 3/12, he noticed his pulse ox showed HR in the 120s and O2 in 80s. He and wife called cardiology office who instructed them to come to ED.  He was found to have recurrent A.fib along with acute hypoxic respiratory failure with sats in the 80s. He was placed on 3L O2.   He says that he has an O2 concentrator at home but doesn't need to use it much. He has used it intermittently and last was roughly 2 weeks ago. He denies any fevers/chills/sweats, chest pain, cough, N/V/D, abd pain, myalgias, exposures to known sick contacts. He says he has been sitting out on his porch a lot lately. Besides a runny nose, no additional symptoms.  In ED, CXR was negative. BNP mildly up at 400s. CT chest was also obtained and was negative for PNA but showed similar to slightly enlarged RUL nodule. Echo was performed and results are pending.  Given his new O2 requirement, PCCM asked to see in consultation.  Pertinent  Medical History:  has Fracture of radius, distal, left, closed; Primary osteoarthritis of left foot; Strain of left tibialis anterior muscle; Stroke-like episode ([place]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; Essential hypertension; Tachycardia; Sepsis ([place]); Diabetes ([place]); Subacute osteomyelitis, left ankle and foot ([place]); A-fib ([place]); Cerebrovascular accident (CVA) ([place]); Benign essential HTN; Diabetes mellitus type 2 in nonobese [facility]); Hypokalemia; PAF (paroxysmal atrial  fibrillation) ([place]); Leukocytosis; SIRS (systemic inflammatory response syndrome) ([place]); Status post transmetatarsal amputation of left foot ([place]); Postoperative pain; Benign prostatic hyperplasia with urinary retention; New onset atrial fibrillation ([place]); Incontinence of feces; History of transmetatarsal amputation of left foot ([place]); 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 ([place]); Diabetic foot ulcer ([place]); Debility; Urinary retention with incomplete bladder emptying; Acute exacerbation of CHF (congestive heart failure) ([place]); Renal insufficiency; Insulin dependent type 2 diabetes mellitus ([place]); Dehiscence of amputation stump ([place]); UTI (urinary tract infection); Chronic diastolic CHF (congestive heart failure) ([place]); HLD (hyperlipidemia); Pneumonia due to COVID-19 virus; Chronic kidney disease, stage 3b ([place]); Chronic respiratory failure ([place]); H/O ulcerative colitis; COPD (chronic obstructive pulmonary disease) ([place]); Postinflammatory pulmonary fibrosis ([place]); Pulmonary nodules/lesions, multiple; Cough; Lung mass; Acute on chronic respiratory failure with hypoxia ([place]); History of recurrent UTI (urinary tract infection); DNR (do not resuscitate); Hypoxia; and Shortness of breath on their problem list.  Significant Hospital Events: Including procedures, antibiotic start and stop dates in addition to other pertinent events   Echocardiogram 3/13 >> LVEF 55-60%, indeterminate diastolic parameters but suggests RV overload.  Moderately enlarged RV and mildly reduced RV function.  Estimated RVSP 30.5  Interim History / Subjective:   Feels better. Remains on O2 3L/min I/O -1.8 L Dopplers still pending   Objective:  Blood pressure 109/73, pulse 90, temperature 99.3 F (37.4 C), temperature source Oral, resp. rate 16, height '6\' 2"'$  (1.88 m), weight 95.1 kg, SpO2 91 %.        Intake/Output Summary (Last 24 hours) at 12/09/2022 1137 Last  data filed at 12/09/2022 0926 Gross per 24 hour  Intake  360 ml  Output 1450 ml  Net -1090 ml   Filed Weights   12/07/22 2137 12/09/22 0500  Weight: 99.8 kg 95.1 kg    Examination: General: Comfortable laying in bed on supplemental oxygen Neuro: Awake, alert, interacting appropriately, no focal deficits HEENT: Oropharynx clear, poor dentition, pupils equal Cardiovascular: Irregularly irregular, A-fib on monitor, no murmur Lungs: Very distant, no crackles or wheezes Abdomen: Nontender, nondistended with positive bowel sounds Musculoskeletal: No edema Skin: Left anterior foot ulcer  Assessment & Plan:   Acute on probable chronic hypoxic respiratory failure - presumed exacerbated by AFRVR, unclear why he has had recurrence after DCCV. Low suspicion for DVT/PE given that he is chronically anticoagulated but still at some risk especially given his RUL nodule. -Suspect that he will need supplemental oxygen and at least with exertion and at night.  He was not using O2 reliably with exertion but he does have portable tanks, so he was probably under using. -Ambulatory oximetry prior to discharge to determine appropriate flow rate with exertion -Lower extremity duplex pending although suspicion for pulmonary embolism is low given his anticoagulation  COPD/emphysema - no hx to support exacerbation. -On Stiolto at home, formulary substitution here -Albuterol if needed -He follows with me on 12/17/2022  Enlarging RUL nodule. -Have discussed with him the likelihood that this is a slow-growing non-small cell lung cancer.  He understands.  He would be interested in navigational bronchoscopy to get a tissue diagnosis.  We will discuss this in detail, probably plan a procedure as an outpatient when I see him on 3/22 as long as no barriers identified  PAF - s/p DCCV 12/02/22. -Appreciate cardiology management    Best practice (evaluated daily):  Per primary team.  Labs   CBC: Recent Labs  Lab 12/07/22 2130 12/08/22 0156 12/09/22 0133  WBC 8.8  10.5 11.4*  NEUTROABS 5.7  --  7.4  HGB 15.2 14.2 16.0  HCT 46.3 43.1 47.9  MCV 88.5 87.6 87.9  PLT 332 327 [id number]    Basic Metabolic Panel: Recent Labs  Lab 12/07/22 2130 12/08/22 0156 12/09/22 0133  NA 130* 132* 130*  K 4.7 3.7 4.4  CL 97* 100 96*  CO2 19* 20* 22  GLUCOSE 153* 183* 171*  BUN 23 20 30*  CREATININE 1.94* 2.07* 2.57*  CALCIUM 8.9 8.7* 8.8*  MG  --   --  2.2   GFR: Estimated Creatinine Clearance: 27.1 mL/min (A) (by C-G formula based on SCr of 2.57 mg/dL (H)). Recent Labs  Lab 12/07/22 2130 12/08/22 0156 12/09/22 0133  WBC 8.8 10.5 11.4*    Liver Function Tests: No results for input(s): "AST", "ALT", "ALKPHOS", "BILITOT", "PROT", "ALBUMIN" in the last 168 hours. No results for input(s): "LIPASE", "AMYLASE" in the last 168 hours. No results for input(s): "AMMONIA" in the last 168 hours.  ABG    Component Value Date/Time   HCO3 26.5 12/15/2018 2133   TCO2 28 12/15/2018 2133   O2SAT 47.0 12/15/2018 2133     Coagulation Profile: No results for input(s): "INR", "PROTIME" in the last 168 hours.  Cardiac Enzymes: No results for input(s): "CKTOTAL", "CKMB", "CKMBINDEX", "TROPONINI" in the last 168 hours.  HbA1C: Hgb A1c MFr Bld  Date/Time Value Ref Range Status  12/08/2022 01:56 AM 10.2 (H) 4.8 - 5.6 % Final    Comment:    (NOTE)         Prediabetes: 5.7 - 6.4  Diabetes: >6.4         Glycemic control for adults with diabetes: <7.0   08/20/2019 09:04 AM 8.7 (H) 4.8 - 5.6 % Final    Comment:    (NOTE) Pre diabetes:          5.7%-6.4% Diabetes:              >6.4% Glycemic control for   <7.0% adults with diabetes     CBG: Recent Labs  Lab 12/08/22 0806 12/08/22 1315 12/08/22 1650 12/08/22 2154 12/09/22 0642  GLUCAP 216* 251* 114* 253* 262*    Baltazar Apo, MD, PhD 12/09/2022, 11:38 AM [facility] [phone] or if no answer before 7:00PM call [phone] For any issues after 7:00PM please call  eLink [phone]

## 2022-12-09 NOTE — Progress Notes (Signed)
Urine is yellow, cloudy. No dysuria, no fever. Kevin [name] made aware. No order made.

## 2022-12-10 ENCOUNTER — Other Ambulatory Visit (HOSPITAL_COMMUNITY): Payer: Self-pay

## 2022-12-10 ENCOUNTER — Telehealth (HOSPITAL_COMMUNITY): Payer: Self-pay

## 2022-12-10 DIAGNOSIS — I2510 Atherosclerotic heart disease of native coronary artery without angina pectoris: Secondary | ICD-10-CM | POA: Diagnosis not present

## 2022-12-10 DIAGNOSIS — I5032 Chronic diastolic (congestive) heart failure: Secondary | ICD-10-CM | POA: Diagnosis not present

## 2022-12-10 DIAGNOSIS — J9621 Acute and chronic respiratory failure with hypoxia: Secondary | ICD-10-CM | POA: Diagnosis not present

## 2022-12-10 LAB — BASIC METABOLIC PANEL
Anion gap: 12 (ref 5–15)
BUN: 47 mg/dL — ABNORMAL HIGH (ref 8–23)
CO2: 18 mmol/L — ABNORMAL LOW (ref 22–32)
Calcium: 8.7 mg/dL — ABNORMAL LOW (ref 8.9–10.3)
Chloride: 95 mmol/L — ABNORMAL LOW (ref 98–111)
Creatinine, Ser: 3.37 mg/dL — ABNORMAL HIGH (ref 0.61–1.24)
GFR, Estimated: 18 mL/min — ABNORMAL LOW (ref >60)
Glucose, Bld: 183 mg/dL — ABNORMAL HIGH (ref 70–99)
Potassium: 4.1 mmol/L (ref 3.5–5.1)
Sodium: 125 mmol/L — ABNORMAL LOW (ref 135–145)

## 2022-12-10 LAB — CBC WITH DIFFERENTIAL/PLATELET
Abs Immature Granulocytes: 0.05 10*3/uL (ref 0.00–0.07)
Basophils Absolute: 0.1 10*3/uL (ref 0.0–0.1)
Basophils Relative: 1 %
Eosinophils Absolute: 0.2 10*3/uL (ref 0.0–0.5)
Eosinophils Relative: 2 %
HCT: 46.2 % (ref 39.0–52.0)
Hemoglobin: 15.1 g/dL (ref 13.0–17.0)
Immature Granulocytes: 1 %
Lymphocytes Relative: 29 %
Lymphs Abs: 3.1 10*3/uL (ref 0.7–4.0)
MCH: 28.4 pg (ref 26.0–34.0)
MCHC: 32.7 g/dL (ref 30.0–36.0)
MCV: 87 fL (ref 80.0–100.0)
Monocytes Absolute: 1.5 10*3/uL — ABNORMAL HIGH (ref 0.1–1.0)
Monocytes Relative: 14 %
Neutro Abs: 5.8 10*3/uL (ref 1.7–7.7)
Neutrophils Relative %: 53 %
Platelets: 378 10*3/uL (ref 150–400)
RBC: 5.31 MIL/uL (ref 4.22–5.81)
RDW: 14.7 % (ref 11.5–15.5)
WBC: 10.8 10*3/uL — ABNORMAL HIGH (ref 4.0–10.5)
nRBC: 0 % (ref 0.0–0.2)

## 2022-12-10 LAB — CREATININE, URINE, RANDOM: Creatinine, Urine: 74 mg/dL

## 2022-12-10 LAB — URINALYSIS, W/ REFLEX TO CULTURE (INFECTION SUSPECTED)
Bilirubin Urine: NEGATIVE
Glucose, UA: 150 mg/dL — AB
Ketones, ur: NEGATIVE mg/dL
Nitrite: NEGATIVE
Protein, ur: 100 mg/dL — AB
Specific Gravity, Urine: 1.012 (ref 1.005–1.030)
WBC, UA: >50 WBC/hpf (ref 0–5)
pH: 5 (ref 5.0–8.0)

## 2022-12-10 LAB — OSMOLALITY: Osmolality: 308 mOsm/kg — ABNORMAL HIGH (ref 275–295)

## 2022-12-10 LAB — SODIUM, URINE, RANDOM: Sodium, Ur: 80 mmol/L

## 2022-12-10 LAB — GLUCOSE, CAPILLARY
Glucose-Capillary: 239 mg/dL — ABNORMAL HIGH (ref 70–99)
Glucose-Capillary: 274 mg/dL — ABNORMAL HIGH (ref 70–99)
Glucose-Capillary: 323 mg/dL — ABNORMAL HIGH (ref 70–99)
Glucose-Capillary: 263 mg/dL — ABNORMAL HIGH (ref 70–99)

## 2022-12-10 LAB — OSMOLALITY, URINE: Osmolality, Ur: 325 mOsm/kg (ref 300–900)

## 2022-12-10 MED ORDER — SODIUM CHLORIDE 0.9 % IV SOLN: INTRAVENOUS | Status: AC

## 2022-12-10 MED ORDER — LIDOCAINE VISCOUS HCL 2 % MT SOLN
15.0000 mL | Freq: Four times a day (QID) | OROMUCOSAL | Status: DC | PRN
  Administered 2022-12-10 – 2022-12-12 (×4): 15 mL via OROMUCOSAL
  Filled 2022-12-10 (×6): qty 15

## 2022-12-10 NOTE — Progress Notes (Signed)
NAME:  Kevin [name], MRN:  [id number], DOB:  Feb 05, 1941, LOS: 2 ADMISSION DATE:  12/07/2022, CONSULTATION DATE:  12/08/22 REFERRING MD:  Lorin Mercy CHIEF COMPLAINT:  Dyspnea   History of Present Illness:  Kevin [name] is a 82 y.o. male who has a PMH as below including but not limited to emphysema/COPD, RUL nodule, PAF on Eliquis, HFpEF, HTN, CKD, DM, CVA. He presented to [facility] ED 3/12 with dyspnea. He apparently had DCCV on 3/7 and had been doing fairly well immediately after. Around 3/9, he started feeling dyspnea mainly with exertion. This progressed and on 3/12, he noticed his pulse ox showed HR in the 120s and O2 in 80s. He and wife called cardiology office who instructed them to come to ED.  He was found to have recurrent A.fib along with acute hypoxic respiratory failure with sats in the 80s. He was placed on 3L O2.   He says that he has an O2 concentrator at home but doesn't need to use it much. He has used it intermittently and last was roughly 2 weeks ago. He denies any fevers/chills/sweats, chest pain, cough, N/V/D, abd pain, myalgias, exposures to known sick contacts. He says he has been sitting out on his porch a lot lately. Besides a runny nose, no additional symptoms.  In ED, CXR was negative. BNP mildly up at 400s. CT chest was also obtained and was negative for PNA but showed similar to slightly enlarged RUL nodule. Echo was performed and results are pending.  Given his new O2 requirement, PCCM asked to see in consultation.  Pertinent  Medical History:  has Fracture of radius, distal, left, closed; Primary osteoarthritis of left foot; Strain of left tibialis anterior muscle; Stroke-like episode ([place]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; Essential hypertension; Tachycardia; Sepsis ([place]); Diabetes ([place]); Subacute osteomyelitis, left ankle and foot ([place]); A-fib ([place]); Cerebrovascular accident (CVA) ([place]); Benign essential HTN; Diabetes mellitus type 2 in nonobese [facility]); Hypokalemia; PAF (paroxysmal atrial  fibrillation) ([place]); Leukocytosis; SIRS (systemic inflammatory response syndrome) ([place]); Status post transmetatarsal amputation of left foot ([place]); Postoperative pain; Benign prostatic hyperplasia with urinary retention; New onset atrial fibrillation ([place]); Incontinence of feces; History of transmetatarsal amputation of left foot ([place]); 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 ([place]); Diabetic foot ulcer ([place]); Debility; Urinary retention with incomplete bladder emptying; Acute exacerbation of CHF (congestive heart failure) ([place]); Renal insufficiency; Insulin dependent type 2 diabetes mellitus ([place]); Dehiscence of amputation stump ([place]); UTI (urinary tract infection); Chronic diastolic CHF (congestive heart failure) ([place]); HLD (hyperlipidemia); Pneumonia due to COVID-19 virus; Chronic kidney disease, stage 3b ([place]); Chronic respiratory failure ([place]); H/O ulcerative colitis; COPD (chronic obstructive pulmonary disease) ([place]); Postinflammatory pulmonary fibrosis ([place]); Pulmonary nodules/lesions, multiple; Cough; Lung mass; Acute on chronic respiratory failure with hypoxia ([place]); History of recurrent UTI (urinary tract infection); DNR (do not resuscitate); Hypoxia; Shortness of breath; and CAD (coronary artery disease) on their problem list.  Significant Hospital Events: Including procedures, antibiotic start and stop dates in addition to other pertinent events   Echocardiogram 3/13 >> LVEF 55-60%, indeterminate diastolic parameters but suggests RV overload.  Moderately enlarged RV and mildly reduced RV function.  Estimated RVSP 30.5  Interim History / Subjective:   No acute distress at rest Objective:  Blood pressure 109/73, pulse 70, temperature (!) 97.4 F (36.3 C), temperature source Oral, resp. rate 20, height 6\' 2"  (1.88 m), weight 95.1 kg, SpO2 92 %.        Intake/Output Summary (Last 24 hours) at 12/10/2022 [id number] Last data filed at 12/10/2022  0743 Gross per 24 hour  Intake --  Output  900 ml  Net -900 ml   Filed Weights   12/07/22 2137 12/09/22 0500  Weight: 99.8 kg 95.1 kg    Examination: General: Awake alert no acute distress HEENT: MM pink/moist no JVD is appreciated Neuro: Grossly intact without focal defect CV: Atrial fibrillation   PULM: Decreased breath sounds 3 L nasal cannula with O2 saturations of 93%  GI: soft, bsx4 active  GU: Extremities: warm/dry, voids negative edema, left foot with old metatarsal amputation Skin: no rashes or lesions  Recent Labs  Lab 12/08/22 0156 12/09/22 0133 12/10/22 0254  NA 132* 130* 125*  K 3.7 4.4 4.1  CL 100 96* 95*  CO2 20* 22 18*  BUN 20 30* 47*  CREATININE 2.07* 2.57* 3.37*  GLUCOSE 183* 171* 183*   Recent Labs  Lab 12/08/22 0156 12/09/22 0133 12/10/22 0254  HGB 14.2 16.0 15.1  HCT 43.1 47.9 46.2  WBC 10.5 11.4* 10.8*  PLT 327 375 378   No chest x-ray Assessment & Plan:   Acute on probable chronic hypoxic respiratory failure - presumed exacerbated by AFRVR, unclear why he has had recurrence after DCCV. Low suspicion for DVT/PE given that he is chronically anticoagulated but still at some risk especially given his RUL nodule. Ambulatory evaluation without oxygen to ascertain if he needs full-time oxygen. Lower extremity Doppler studies were negative   -Suspect that he will need supplemental oxygen and at least with exertion and at night.  He was not using O2 reliably with exertion but he does have portable tanks, so he was probably under using. -Ambulatory oximetry prior to discharge to determine appropriate flow rate with exertion -Lower extremity duplex pending although suspicion for pulmonary embolism is low given his anticoagulation  COPD/emphysema - no hx to support exacerbation. Continue bronchodilators Follow-up with pulmonary as an outpatient  Enlarging RUL nodule. Dr. Lamonte Sakai to see him as an outpatient on 12/17/2022  PAF - s/p DCCV 12/02/22. Cardiology    Best practice (evaluated  daily):  Per primary team.  Labs   CBC: Recent Labs  Lab 12/07/22 2130 12/08/22 0156 12/09/22 0133 12/10/22 0254  WBC 8.8 10.5 11.4* 10.8*  NEUTROABS 5.7  --  7.4 5.8  HGB 15.2 14.2 16.0 15.1  HCT 46.3 43.1 47.9 46.2  MCV 88.5 87.6 87.9 87.0  PLT 332 327 375 [id number]    Basic Metabolic Panel: Recent Labs  Lab 12/07/22 2130 12/08/22 0156 12/09/22 0133 12/10/22 0254  NA 130* 132* 130* 125*  K 4.7 3.7 4.4 4.1  CL 97* 100 96* 95*  CO2 19* 20* 22 18*  GLUCOSE 153* 183* 171* 183*  BUN 23 20 30* 47*  CREATININE 1.94* 2.07* 2.57* 3.37*  CALCIUM 8.9 8.7* 8.8* 8.7*  MG  --   --  2.2  --    GFR: Estimated Creatinine Clearance: 20.7 mL/min (A) (by C-G formula based on SCr of 3.37 mg/dL (H)). Recent Labs  Lab 12/07/22 2130 12/08/22 0156 12/09/22 0133 12/10/22 0254  WBC 8.8 10.5 11.4* 10.8*    Liver Function Tests: No results for input(s): "AST", "ALT", "ALKPHOS", "BILITOT", "PROT", "ALBUMIN" in the last 168 hours. No results for input(s): "LIPASE", "AMYLASE" in the last 168 hours. No results for input(s): "AMMONIA" in the last 168 hours.  ABG    Component Value Date/Time   HCO3 26.5 12/15/2018 2133   TCO2 28 12/15/2018 2133   O2SAT 47.0 12/15/2018 2133     Coagulation Profile: No  results for input(s): "INR", "PROTIME" in the last 168 hours.  Cardiac Enzymes: No results for input(s): "CKTOTAL", "CKMB", "CKMBINDEX", "TROPONINI" in the last 168 hours.  HbA1C: Hgb A1c MFr Bld  Date/Time Value Ref Range Status  12/08/2022 01:56 AM 10.2 (H) 4.8 - 5.6 % Final    Comment:    (NOTE)         Prediabetes: 5.7 - 6.4         Diabetes: >6.4         Glycemic control for adults with diabetes: <7.0   08/20/2019 09:04 AM 8.7 (H) 4.8 - 5.6 % Final    Comment:    (NOTE) Pre diabetes:          5.7%-6.4% Diabetes:              >6.4% Glycemic control for   <7.0% adults with diabetes     CBG: Recent Labs  Lab 12/09/22 0642 12/09/22 1221 12/09/22 1557 12/09/22 2110  12/10/22 0740  GLUCAP 262* 249* 281* [place] Parson ACNP Acute Care Nurse Practitioner [place] Please consult Amion 12/10/2022, 9:25 AM

## 2022-12-10 NOTE — Inpatient Diabetes Management (Addendum)
Inpatient Diabetes Program Recommendations  AACE/ADA: New Consensus Statement on Inpatient Glycemic Control (2015)  Target Ranges:  Prepandial:   less than 140 mg/dL      Peak postprandial:   less than 180 mg/dL (1-2 hours)      Critically ill patients:  140 - 180 mg/dL   Lab Results  Component Value Date   GLUCAP 274 (H) 12/10/2022   HGBA1C 10.2 (H) 12/08/2022    Review of Glycemic Control  Diabetes history: type 2 Outpatient Diabetes medications: Lantus Solostar insulin pen 15-32 units BID (no scale to follow), Jardiance 10 mg daily Current orders for Inpatient glycemic control: Semglee 12 units BID, Novolog 0-15 units correction scale TID & HS   Inpatient Diabetes Program Recommendations:   Spoke with patient and wife at the beside. Patient was interested in getting a CGM before discharge. States that he has one at home that he has not used yet. Was able to get his phone set up with app. Freestyle Libre 3 was attached to back of right arm and was able to be set up on phone. Was able to show them how to attach future CGM's.   Noted that patient has been on Lantus Solostar insulin pen at home. For discharge: Recommend Lantus twice a day and Novolog with his meals which are x2 per day. Weight based dosage: 95 kg X 0.3 units/kg=28.5 units.  Recommend Lantus 12 units BID, Novolog SENSITIVE (0-9 units) correction scale BID (with meals) These dosages can be titrated by endo if needed.  *Benefit check per Lyndel Safe CPhT states that Novolog insulin pen copay is $35.     For discharge: Pharmacy technician was able to do a benefit check on cost of Lantus and Novolog.  Lantus Solostar insulin pen (order [id number]) $35 Novolog Flexpen insulin pen (order [id number]) $35 Insulin pen needles 32 gauge X 87mm (order [id number])  Harvel Ricks RN BSN CDE Diabetes Coordinator Pager: [phone]  8am-5pm

## 2022-12-10 NOTE — Progress Notes (Signed)
Nurse requested Mobility Specialist to perform oxygen saturation test with pt which includes removing pt from oxygen both at rest and while ambulating.  Below are the results from that testing.     Patient Saturations on Room Air at Rest = spO2 91%  Patient Saturations on Room Air while Ambulating = sp02 87% .   Patient Saturations on 4 Liters of oxygen while Ambulating = sp02 91%  At end of testing pt left in room on 4  Liters of oxygen.  Reported results to nurse.  Nelta Numbers Mobility Specialist Please contact via SecureChat or  Rehab office at [phone]

## 2022-12-10 NOTE — Progress Notes (Signed)
Rounding Note    Patient Name: Kevin [name] Date of Encounter: 12/10/2022  [place] Cardiologist: Lauree Chandler, MD   Subjective   Feeling better.  Breathing improving.   Inpatient Medications    Scheduled Meds:  amoxicillin-clavulanate  1 tablet Oral Q12H   apixaban  5 mg Oral BID   diltiazem  120 mg Oral Daily   doxycycline  100 mg Oral Q12H   insulin aspart  0-15 Units Subcutaneous TID PC & HS   insulin glargine-yfgn  12 Units Subcutaneous BID   leptospermum manuka honey  1 Application Topical Daily   mesalamine  1.2 g Oral BID   metoprolol tartrate  50 mg Oral BID   oxybutynin  5 mg Oral QHS   rosuvastatin  5 mg Oral Daily   tamsulosin  0.4 mg Oral q12n4p   trimethoprim  100 mg Oral QHS   umeclidinium-vilanterol  1 puff Inhalation Daily   Continuous Infusions:  PRN Meds: albuterol, melatonin   Vital Signs    Vitals:   12/10/22 0446 12/10/22 0742 12/10/22 0752 12/10/22 0812  BP: 118/73 97/67  109/73  Pulse:  70    Resp: 18 20    Temp: 97.8 F (36.6 C) (!) 97.4 F (36.3 C)    TempSrc: Oral Oral    SpO2:  94% 92%   Weight:      Height:        Intake/Output Summary (Last 24 hours) at 12/10/2022 0958 Last data filed at 12/10/2022 0743 Gross per 24 hour  Intake --  Output 700 ml  Net -700 ml      12/09/2022    5:00 AM 12/07/2022    9:37 PM 12/02/2022    8:00 AM  Last 3 Weights  Weight (lbs) 209 lb 10.5 oz 220 lb 222 lb  Weight (kg) 95.1 kg 99.791 kg 100.699 kg      Telemetry    Atrial fibrillation.  Rate 70s.  PVCs - Personally Reviewed  ECG    N/a - Personally Reviewed  Physical Exam   VS:  BP 109/73   Pulse 70   Temp (!) 97.4 F (36.3 C) (Oral)   Resp 20   Ht 6\' 2"  (1.88 m)   Wt 95.1 kg   SpO2 92%   BMI 26.92 kg/m  , BMI Body mass index is 26.92 kg/m. GENERAL:  Well appearing HEENT: Pupils equal round and reactive, fundi not visualized, oral mucosa unremarkable NECK:  No jugular venous distention,  waveform within normal limits, carotid upstroke brisk and symmetric, no bruits, no thyromegaly LUNGS:  Expiratory wheezing HEART:  Irregularly irregular.  PMI not displaced or sustained,S1 and S2 within normal limits, no S3, no S4, no clicks, no rubs, no murmurs ABD:  Flat, positive bowel sounds normal in frequency in pitch, no bruits, no rebound, no guarding, no midline pulsatile mass, no hepatomegaly, no splenomegaly EXT:  2 plus pulses throughout, no edema, no cyanosis no clubbing SKIN:  No rashes no nodules NEURO:  Cranial nerves II through XII grossly intact, motor grossly intact throughout [facility]:  Cognitively intact, oriented to person place and time   Labs    High Sensitivity Troponin:   Recent Labs  Lab 12/07/22 2130 12/08/22 0006  TROPONINIHS 20* 19*     Chemistry Recent Labs  Lab 12/08/22 0156 12/09/22 0133 12/10/22 0254  NA 132* 130* 125*  K 3.7 4.4 4.1  CL 100 96* 95*  CO2 20* 22 18*  GLUCOSE 183* 171* 183*  BUN 20 30* 47*  CREATININE 2.07* 2.57* 3.37*  CALCIUM 8.7* 8.8* 8.7*  MG  --  2.2  --   GFRNONAA 32* 25* 18*  ANIONGAP 12 12 12     Lipids No results for input(s): "CHOL", "TRIG", "HDL", "LABVLDL", "LDLCALC", "CHOLHDL" in the last 168 hours.  Hematology Recent Labs  Lab 12/08/22 0156 12/09/22 0133 12/10/22 0254  WBC 10.5 11.4* 10.8*  RBC 4.92 5.45 5.31  HGB 14.2 16.0 15.1  HCT 43.1 47.9 46.2  MCV 87.6 87.9 87.0  MCH 28.9 29.4 28.4  MCHC 32.9 33.4 32.7  RDW 15.3 15.2 14.7  PLT 327 375 378   Thyroid No results for input(s): "TSH", "FREET4" in the last 168 hours.  BNP Recent Labs  Lab 12/07/22 2130  BNP 475.5*    DDimer No results for input(s): "DDIMER" in the last 168 hours.   Radiology    VAS [place] LOWER EXTREMITY VENOUS (DVT)  Result Date: 12/09/2022  Lower Venous DVT Study Patient Name:  Kevin [name]  Date of Exam:   12/09/2022 Medical Rec #: [id number]            Accession #:    [id number] Date of Birth: 1941-03-29            Patient Gender: M Patient Age:   82 years Exam Location:  [facility] Procedure:      VAS [place] LOWER EXTREMITY VENOUS (DVT) Referring Phys: Montey Hora --------------------------------------------------------------------------------  Indications: SOB.  Risk Factors: Paroxysmal atrial fibrillation, heart failure. Comparison Study: No prior study on file Performing Technologist: Sharion Dove RVS  Examination Guidelines: A complete evaluation includes B-mode imaging, spectral Doppler, color Doppler, and power Doppler as needed of all accessible portions of each vessel. Bilateral testing is considered an integral part of a complete examination. Limited examinations for reoccurring indications may be performed as noted. The reflux portion of the exam is performed with the patient in reverse Trendelenburg.  +---------+---------------+---------+-----------+----------+--------------+ RIGHT    CompressibilityPhasicitySpontaneityPropertiesThrombus Aging +---------+---------------+---------+-----------+----------+--------------+ CFV      Full           Yes      Yes                                 +---------+---------------+---------+-----------+----------+--------------+ SFJ      Full                                                        +---------+---------------+---------+-----------+----------+--------------+ FV Prox  Full                                                        +---------+---------------+---------+-----------+----------+--------------+ FV Mid   Full           Yes      Yes                                 +---------+---------------+---------+-----------+----------+--------------+ FV DistalFull                                                        +---------+---------------+---------+-----------+----------+--------------+  PFV      Full                                                         +---------+---------------+---------+-----------+----------+--------------+ POP      Full           Yes      Yes                                 +---------+---------------+---------+-----------+----------+--------------+ PTV      Full                                                        +---------+---------------+---------+-----------+----------+--------------+ PERO     Full                                                        +---------+---------------+---------+-----------+----------+--------------+   +---------+---------------+---------+-----------+----------+--------------+ LEFT     CompressibilityPhasicitySpontaneityPropertiesThrombus Aging +---------+---------------+---------+-----------+----------+--------------+ CFV      Full           Yes      Yes                                 +---------+---------------+---------+-----------+----------+--------------+ SFJ      Full                                                        +---------+---------------+---------+-----------+----------+--------------+ FV Prox  Full                                                        +---------+---------------+---------+-----------+----------+--------------+ FV Mid   Full           Yes      Yes                                 +---------+---------------+---------+-----------+----------+--------------+ FV DistalFull                                                        +---------+---------------+---------+-----------+----------+--------------+ PFV      Full                                                        +---------+---------------+---------+-----------+----------+--------------+   POP      Full           Yes      Yes                                 +---------+---------------+---------+-----------+----------+--------------+ PTV      Full                                                         +---------+---------------+---------+-----------+----------+--------------+ PERO     Full                                                        +---------+---------------+---------+-----------+----------+--------------+     Summary: BILATERAL: - No evidence of deep vein thrombosis seen in the lower extremities, bilaterally. -No evidence of popliteal cyst, bilaterally.   *See table(s) above for measurements and observations. Electronically signed by Orlie Pollen on 12/09/2022 at 5:36:04 PM.    Final    MR FOOT LEFT W WO CONTRAST  Result Date: 12/08/2022 CLINICAL DATA:  Plantar foot ulcer.  History of prior amputations. EXAM: MRI OF THE LEFT FOREFOOT WITHOUT AND WITH CONTRAST TECHNIQUE: Multiplanar, multisequence MR imaging of the left foot was performed both before and after administration of intravenous contrast. 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 There is a deep open wound on the plantar aspect of the foot extending down to the bone. Abnormal STIR signal intensity and enhancement in the medial cuneiform consistent with osteomyelitis. No findings suspicious for septic arthritis. No drainable soft tissue abscess or pyomyositis. IMPRESSION: 1. Deep open wound on the plantar aspect of the foot extending down to the bone. 2. Osteomyelitis involving the medial cuneiform. 3. No findings for septic arthritis or soft tissue abscess. Electronically Signed   By: Marijo Sanes M.D.   On: 12/08/2022 15:58   ECHOCARDIOGRAM COMPLETE  Result Date: 12/08/2022    ECHOCARDIOGRAM REPORT   Patient Name:   JARVELL PYON Date of Exam: 12/08/2022 Medical Rec #:  [id number]           Height:       74.0 in Accession #:    [id number]          Weight:       220.0 lb Date of Birth:  [birth year]          BSA:          2.263 m Patient Age:    72 years            BP:           116/81 mmHg  Patient Gender: M                   HR:           81 bpm. Exam Location:  Inpatient Procedure: 2D Echo, Cardiac Doppler and Color Doppler Indications:    CHF  History:        Patient has prior history of Echocardiogram examinations,  most                 recent 04/24/2020. CHF, COPD and Stroke, Arrythmias:Atrial                 Fibrillation and Tachycardia; Risk Factors:Hypertension,                 Diabetes, Dyslipidemia and Former Smoker.  Sonographer:    Wenda Low Referring Phys: [id number] [place] T TU  Sonographer Comments: Technically difficult study due to poor echo windows. Image acquisition challenging due to respiratory motion. IMPRESSIONS  1. Left ventricular ejection fraction, by estimation, is 55 to 60%. The left ventricle has normal function. The left ventricle has no regional wall motion abnormalities. Left ventricular diastolic parameters are indeterminate. There is the interventricular septum is flattened in systole, suggestive of right ventricular pressure overload.  2. Right ventricular systolic function is mildly reduced. The right ventricular size is moderately enlarged. There is normal pulmonary artery systolic pressure. The estimated right ventricular systolic pressure is [id number] mmHg.  3. Left atrial size was mildly dilated.  4. Right atrial size was moderately dilated.  5. The mitral valve is grossly normal. Trivial mitral valve regurgitation. No evidence of mitral stenosis.  6. The aortic valve is tricuspid. There is mild calcification of the aortic valve. There is mild thickening of the aortic valve. Aortic valve regurgitation is not visualized. No aortic stenosis is present.  7. The inferior vena cava is normal in size with greater than 50% respiratory variability, suggesting right atrial pressure of 3 mmHg. FINDINGS  Left Ventricle: Left ventricular ejection fraction, by estimation, is 55 to 60%. The left ventricle has normal function. The left ventricle has no regional wall motion  abnormalities. The left ventricular internal cavity size was normal in size. There is  no left ventricular hypertrophy. The interventricular septum is flattened in systole, consistent with right ventricular pressure overload. Left ventricular diastolic parameters are indeterminate. Right Ventricle: The right ventricular size is moderately enlarged. No increase in right ventricular wall thickness. Right ventricular systolic function is mildly reduced. There is normal pulmonary artery systolic pressure. The tricuspid regurgitant velocity is 2.62 m/s, and with an assumed right atrial pressure of 3 mmHg, the estimated right ventricular systolic pressure is [id number] mmHg. Left Atrium: Left atrial size was mildly dilated. Right Atrium: Right atrial size was moderately dilated. Pericardium: There is no evidence of pericardial effusion. Mitral Valve: The mitral valve is grossly normal. Trivial mitral valve regurgitation. No evidence of mitral valve stenosis. MV peak gradient, 2.8 mmHg. The mean mitral valve gradient is 1.0 mmHg. Tricuspid Valve: The tricuspid valve is normal in structure. Tricuspid valve regurgitation is mild . No evidence of tricuspid stenosis. Aortic Valve: The aortic valve is tricuspid. There is mild calcification of the aortic valve. There is mild thickening of the aortic valve. Aortic valve regurgitation is not visualized. No aortic stenosis is present. Aortic valve mean gradient measures 1.0 mmHg. Aortic valve peak gradient measures 2.7 mmHg. Aortic valve area, by VTI measures 2.78 cm. Pulmonic Valve: The pulmonic valve was normal in structure. Pulmonic valve regurgitation is not visualized. No evidence of pulmonic stenosis. Aorta: The aortic root is normal in size and structure. Ascending aorta measurements are within normal limits for age when indexed to body surface area. Venous: The inferior vena cava is normal in size with greater than 50% respiratory variability, suggesting right atrial pressure of  3 mmHg. IAS/Shunts: No atrial level shunt detected by color flow Doppler.  LEFT VENTRICLE PLAX 2D LVIDd:         4.60 cm LVIDs:         3.30 cm LV PW:         0.90 cm LV IVS:        1.00 cm LVOT diam:     2.00 cm LV SV:         41 LV SV Index:   18 LVOT Area:     3.14 cm  RIGHT VENTRICLE RV Basal diam:  5.00 cm RV Mid diam:    4.20 cm RV S prime:     10.20 cm/s TAPSE (M-mode): 1.8 cm LEFT ATRIUM             Index        RIGHT ATRIUM           Index LA diam:        4.90 cm 2.16 cm/m   RA Area:     37.10 cm LA Vol (A2C):   81.3 ml 35.92 ml/m  RA Volume:   141.00 ml 62.30 ml/m LA Vol (A4C):   93.8 ml 41.44 ml/m LA Biplane Vol: 88.1 ml 38.93 ml/m  AORTIC VALVE                    PULMONIC VALVE AV Area (Vmax):    2.60 cm     PV Vmax:       0.93 m/s AV Area (Vmean):   2.55 cm     PV Peak grad:  3.4 mmHg AV Area (VTI):     2.78 cm AV Vmax:           82.00 cm/s AV Vmean:          50.100 cm/s AV VTI:            0.146 m AV Peak Grad:      2.7 mmHg AV Mean Grad:      1.0 mmHg LVOT Vmax:         67.90 cm/s LVOT Vmean:        40.700 cm/s LVOT VTI:          0.129 m LVOT/AV VTI ratio: 0.88  AORTA Ao Root diam: 3.70 cm MITRAL VALVE               TRICUSPID VALVE MV Area (PHT): 3.97 cm    TR Peak grad:   27.5 mmHg MV Area VTI:   1.90 cm    TR Vmax:        262.00 cm/s MV Peak grad:  2.8 mmHg MV Mean grad:  1.0 mmHg    SHUNTS MV Vmax:       0.83 m/s    Systemic VTI:  0.13 m MV Vmean:      43.1 cm/s   Systemic Diam: 2.00 cm MV Decel Time: 191 msec MV E velocity: 85.90 cm/s Cherlynn Kaiser MD Electronically signed by Cherlynn Kaiser MD Signature Date/Time: 12/08/2022/2:15:48 PM    Final     Cardiac Studies   TTE 12/08/2022 IMPRESSIONS    1. Left ventricular ejection fraction, by estimation, is 55 to 60%. The  left ventricle has normal function. The left ventricle has no regional  wall motion abnormalities. Left ventricular diastolic parameters are  indeterminate. There is the  interventricular septum is flattened in  systole, suggestive of right  ventricular pressure overload.   2. Right ventricular systolic function is mildly reduced. The right  ventricular size is moderately enlarged.  There is normal pulmonary artery  systolic pressure. The estimated right ventricular systolic pressure is  [id number] mmHg.   3. Left atrial size was mildly dilated.   4. Right atrial size was moderately dilated.   5. The mitral valve is grossly normal. Trivial mitral valve  regurgitation. No evidence of mitral stenosis.   6. The aortic valve is tricuspid. There is mild calcification of the  aortic valve. There is mild thickening of the aortic valve. Aortic valve  regurgitation is not visualized. No aortic stenosis is present.   7. The inferior vena cava is normal in size with greater than 50%  respiratory variability, suggesting right atrial pressure of 3 mmHg.    Patient Profile     82 y.o. male with HFpEF, PAF, diabetes, CKD 3B, hyperlipidemia, COPD, and left transmetatarsal amputation admitted with acute on chronic diastolic heart failure and acute COPD exacerbation with hypoxic respiratory failure.  Assessment & Plan    # Acute on chronic diastolic HF: # Acute hypoxic respiratory failure:  Respiratory status improving.  BNP was elevated on admission.  Renal function worsening and sodium downtrending with diuresis.  Lasix on hold.  Continue metoprolol.  # PAF: He underwent DCCV 12/02/22.  Continue Eliquis.  Metoprolol was increased 3/14 for better rate control.  Continue diltiazem and metoprolol.   # COPD:  Per CC,  not an acute exacerbation, rather progressive disease  Recommend supplemental oxygen at night.   # HL:  Continue rosuvastatin.       For questions or updates, please contact [place] Please consult [email or web address] for contact info under        Signed, [name], MD  12/10/2022, 9:58 AM

## 2022-12-10 NOTE — TOC Benefit Eligibility Note (Signed)
Patient Teacher, English as a foreign language completed.    The patient is currently admitted and upon discharge could be taking Lantus Solostar.  The current 30 day co-pay is $35.00.   The patient is insured through [employer]

## 2022-12-10 NOTE — Care Management Important Message (Signed)
Important Message  Patient Details  Name: Kevin [name] MRN: [id number] Date of Birth: 02/28/1941   Medicare Important Message Given:  Yes     [name] 12/10/2022, 8:44 AM

## 2022-12-10 NOTE — Consult Note (Signed)
Reason for Consult: AKI/CKD stage IIIb Referring Physician: Cristela Felt, MD  Kevin [name] is an 82 y.o. male has a PMH significant for DM type 2, HTN, paroxysmal atrial fibrillation on Eliquis (s/p DCCV on 12/02/22), chronic diastolic CHF, h/o CVA, HLD, COPD, PAD s/p left TMA, RUL lung mass, and CKD stage IIIb who presented to [facility] ED via EMS on 12/07/22 with a 3 day history of rapid heart rate and increased SOB.  In the ED, Bp 112/95, P 73, Temp 98.1, RRR 22, SpO2 92%.  Labs were notable for Na 130, Cl 97, Co2 19, Cr 1.94, BNP 475.5, CXR without evidence of volume overload but had focal opacity in the RUL.  CT scan of chest without evidence of pneumonia, RUL nodule suspicious for primary bronchogenic carcinoma was similar to previous imaging.  He was admitted for acute hypoxic respiratory failure thought to be due to CHF exacerbation and started on IV lasix.  Cardiology was consulted and did not think this was due to CHF so stopped diuretics on 12/09/22.  We were consulted due to the development of AKI/CKD stage IIIb.  Please see trend below.  Of note, he has been on trimethoprim 100 mg daily for UTI prophylaxis and self-catheterizes nightly due to urinary retention. .  Trend in Creatinine: Creatinine, Ser  Date/Time Value Ref Range Status  12/10/2022 02:54 AM 3.37 (H) 0.61 - 1.24 mg/dL Final  12/09/2022 01:33 AM 2.57 (H) 0.61 - 1.24 mg/dL Final  12/08/2022 01:56 AM 2.07 (H) 0.61 - 1.24 mg/dL Final  12/07/2022 09:30 PM 1.94 (H) 0.61 - 1.24 mg/dL Final  11/25/2022 12:13 PM 1.96 (H) 0.76 - 1.27 mg/dL Final  08/24/2019 01:15 AM 1.58 (H) 0.61 - 1.24 mg/dL Final  08/23/2019 01:10 AM 1.68 (H) 0.61 - 1.24 mg/dL Final  08/22/2019 12:25 AM 1.73 (H) 0.61 - 1.24 mg/dL Final  08/19/2019 09:50 PM 1.79 (H) 0.61 - 1.24 mg/dL Final  12/19/2018 03:41 AM 1.77 (H) 0.61 - 1.24 mg/dL Final  12/18/2018 05:03 AM 1.64 (H) 0.61 - 1.24 mg/dL Final  12/17/2018 05:00 AM 1.67 (H) 0.61 - 1.24 mg/dL Final  12/16/2018 06:15 AM  1.96 (H) 0.61 - 1.24 mg/dL Final  12/15/2018 09:32 PM 2.24 (H) 0.61 - 1.24 mg/dL Final  07/24/2018 11:40 AM 1.65 (H) 0.76 - 1.27 mg/dL Final  06/21/2018 01:03 PM 1.60 (H) 0.76 - 1.27 mg/dL Final  05/19/2018 12:36 PM 1.47 (H) 0.76 - 1.27 mg/dL Final  05/11/2018 05:40 AM 1.45 (H) 0.61 - 1.24 mg/dL Final  05/10/2018 03:57 AM 1.34 (H) 0.61 - 1.24 mg/dL Final  05/09/2018 05:34 AM 1.32 (H) 0.61 - 1.24 mg/dL Final  05/08/2018 03:55 AM 1.29 (H) 0.61 - 1.24 mg/dL Final  05/06/2018 06:18 AM 1.53 (H) 0.61 - 1.24 mg/dL Final  05/06/2018 03:28 AM 1.47 (H) 0.61 - 1.24 mg/dL Final  05/03/2018 01:01 PM 1.37 (H) 0.76 - 1.27 mg/dL Final  04/27/2018 12:36 PM 1.40 (H) 0.76 - 1.27 mg/dL Final  03/28/2018 06:15 AM 1.24 0.61 - 1.24 mg/dL Final  03/26/2018 05:00 AM 1.27 (H) 0.61 - 1.24 mg/dL Final  03/25/2018 05:00 AM 1.22 0.61 - 1.24 mg/dL Final  03/24/2018 05:00 AM 1.23 0.61 - 1.24 mg/dL Final  03/23/2018 05:21 AM 1.32 (H) 0.61 - 1.24 mg/dL Final  03/22/2018 04:15 AM 1.36 (H) 0.61 - 1.24 mg/dL Final  03/21/2018 05:08 AM 1.46 (H) 0.61 - 1.24 mg/dL Final  03/19/2018 10:10 AM 1.50 (H) 0.61 - 1.24 mg/dL Final  03/18/2018 02:54 AM 1.52 (H) 0.61 - 1.24 mg/dL  Final  03/17/2018 11:48 AM 1.71 (H) 0.61 - 1.24 mg/dL Final  03/16/2018 08:39 PM 1.90 (H) 0.61 - 1.24 mg/dL Final  03/16/2018 08:32 PM 2.17 (H) 0.61 - 1.24 mg/dL Final    PMH:   Past Medical History:  Diagnosis Date   A-fib ([place]) 03/20/2018   Acute blood loss anemia    Benign prostatic hyperplasia with urinary retention    Cerebrovascular accident (CVA) ([place])    Diabetes mellitus type 2 in nonobese [facility])    Essential hypertension 03/20/2018   Former tobacco use    H/O ulcerative colitis 1993   Leukocytosis    Osteomyelitis ([place])    a. 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   PAF (paroxysmal atrial fibrillation) ([place])    a. dx 03/2018 in setting of stroke, osteomyelitis.   Stage 3 chronic kidney disease ([place])    pt/family unaware   Status post  transmetatarsal amputation of left foot (HCC)     PSH:   Past Surgical History:  Procedure Laterality Date   AMPUTATION Left 03/24/2018   Procedure: Transmetatarsal Amputation Left Foot;  Surgeon: [name], MD;  Location: [place];  Service: Orthopedics;  Laterality: Left;   CARDIOVERSION N/A 05/05/2018   Procedure: CARDIOVERSION;  Surgeon: [name], MD;  Location: [place];  Service: Cardiovascular;  Laterality: N/A;   CARDIOVERSION N/A 12/02/2022   Procedure: CARDIOVERSION;  Surgeon: Buford Dresser, MD;  Location: [facility] ENDOSCOPY;  Service: Cardiovascular;  Laterality: N/A;   COLONOSCOPY  12/2017   benign, remission from ulcerative colitis in 90s   I & D EXTREMITY Left 03/17/2018   Procedure: [place];  Surgeon: [name], MD;  Location: [place];  Service: Orthopedics;  Laterality: Left;   STUMP REVISION Left 05/10/2018   Procedure: REVISION LEFT TRANSMETATARSAL AMPUTATION;  Surgeon: [name], MD;  Location: [place];  Service: Orthopedics;  Laterality: Left;    Allergies: No Known Allergies  Medications:   Prior to Admission medications   Medication Sig Start Date End Date Taking? Authorizing Provider  albuterol (VENTOLIN HFA) 108 (90 Base) MCG/ACT inhaler Inhale 2 puffs into the lungs every 6 (six) hours as needed for wheezing or shortness of breath. 06/03/20  Yes Brand Males, MD  apixaban (ELIQUIS) 5 MG TABS tablet Take 1 tablet (5 mg total) by mouth 2 (two) times daily. 07/14/22  Yes Burnell Blanks, MD  diltiazem (CARDIZEM CD) 120 MG 24 hr capsule Take 1 capsule (120 mg total) by mouth daily. 10/27/22  Yes Burnell Blanks, MD  empagliflozin (JARDIANCE) 10 MG TABS tablet Take 10 mg by mouth daily.   Yes [provider]  escitalopram (LEXAPRO) 10 MG tablet Take 10 mg by mouth daily. 04/06/22  Yes [provider]  furosemide (LASIX) 20 MG tablet TAKE 1 TABLET DAILY. YOU   MAY TAKE AN EXTRA 20MG       TABLET FOR INCREASED       SWELLING OR WEIGHT GAIN Patient taking differently: Take 20 mg by mouth See admin instructions. 20 mg daily. May take an extra 20 mg for increased swelling or weight gain. 07/14/22  Yes Burnell Blanks, MD  loratadine (CLARITIN) 10 MG tablet Take 10 mg by mouth at bedtime.   Yes [provider]  mesalamine (LIALDA) 1.2 g EC tablet Take 1.2 g by mouth daily. 05/07/20  Yes [provider]  Multiple Vitamin (MULTIVITAMIN WITH MINERALS) TABS tablet Take 1 tablet by mouth daily.   Yes [provider]  Multiple Vitamins-Minerals (  PRESERVISION AREDS 2 PO) Take 1 capsule by mouth 2 (two) times daily.   Yes [provider]  oxybutynin (DITROPAN) 5 MG tablet Take 5 mg by mouth at bedtime.   Yes [provider]  rosuvastatin (CRESTOR) 10 MG tablet Take 0.5 tablets (5 mg total) by mouth daily. 10/30/21  Yes Burnell Blanks, MD  tamsulosin (FLOMAX) 0.4 MG CAPS capsule Take 1 capsule (0.4 mg total) by mouth 2 times daily at 12 noon and 4 pm. Patient taking differently: Take 0.4 mg by mouth in the morning and at bedtime. 03/31/18  Yes Love, Ivan Anchors, PA-C  Tiotropium Bromide-Olodaterol (STIOLTO RESPIMAT) 2.5-2.5 MCG/ACT AERS Inhale 2 puffs into the lungs daily. 10/30/21  Yes Parrett, Tammy S, NP  trimethoprim (TRIMPEX) 100 MG tablet Take 100 mg by mouth at bedtime.  08/02/18  Yes [provider]  metoprolol succinate (TOPROL XL) 50 MG 24 hr tablet Hold this medication until you have follow up with your cardiology provider. Patient not taking: Reported on 12/08/2022 12/02/22   Buford Dresser, MD    Inpatient medications:  amoxicillin-clavulanate  1 tablet Oral Q12H   apixaban  5 mg Oral BID   diltiazem  120 mg Oral Daily   doxycycline  100 mg Oral Q12H   insulin aspart  0-15 Units Subcutaneous TID PC & HS   insulin glargine-yfgn  12 Units Subcutaneous BID   leptospermum manuka honey  1 Application Topical Daily    mesalamine  1.2 g Oral BID   metoprolol tartrate  50 mg Oral BID   oxybutynin  5 mg Oral QHS   rosuvastatin  5 mg Oral Daily   tamsulosin  0.4 mg Oral q12n4p   trimethoprim  100 mg Oral QHS   umeclidinium-vilanterol  1 puff Inhalation Daily    Discontinued Meds:   Medications Discontinued During This Encounter  Medication Reason   furosemide (LASIX) injection 40 mg    arformoterol (BROVANA) nebulizer solution 15 mcg    umeclidinium bromide (INCRUSE ELLIPTA) 62.5 MCG/ACT 1 puff    insulin glargine (LANTUS) 100 UNIT/ML injection Entry Error   fluconazole (DIFLUCAN) 200 MG tablet Completed Course   metoprolol tartrate (LOPRESSOR) tablet 25 mg    furosemide (LASIX) tablet 40 mg     Social History:  reports that he quit smoking about 39 years ago. His smoking use included cigarettes. He started smoking about 61 years ago. He has a 20.00 pack-year smoking history. He has never used smokeless tobacco. He reports current alcohol use of about 4.0 standard drinks of alcohol per week. He reports that he does not use drugs.  Family History:   Family History  Problem Relation Age of Onset   Hypertension Father     Pertinent items are noted in HPI. Weight change:   Intake/Output Summary (Last 24 hours) at 12/10/2022 1610 Last data filed at 12/10/2022 0743 Gross per 24 hour  Intake --  Output 700 ml  Net -700 ml   BP 109/73   Pulse 70   Temp (!) 97.4 F (36.3 C) (Oral)   Resp 20   Ht 6\' 2"  (1.88 m)   Wt 95.1 kg   SpO2 92%   BMI 26.92 kg/m  Vitals:   12/10/22 0446 12/10/22 0742 12/10/22 0752 12/10/22 0812  BP: 118/73 97/67  109/73  Pulse:  70    Resp: 18 20    Temp: 97.8 F (36.6 C) (!) 97.4 F (36.3 C)    TempSrc: Oral Oral    SpO2:  94% 92%   Weight:      Height:         General appearance: alert, cooperative, and no distress Head: Normocephalic, without obvious abnormality, atraumatic Resp: clear to auscultation bilaterally Cardio: irregularly irregular rhythm GI:  soft, non-tender; bowel sounds normal; no masses,  no organomegaly Extremities: extremities normal, atraumatic, no cyanosis or edema  Labs: Basic Metabolic Panel: Recent Labs  Lab 12/07/22 2130 12/08/22 0156 12/09/22 0133 12/10/22 0254  NA 130* 132* 130* 125*  K 4.7 3.7 4.4 4.1  CL 97* 100 96* 95*  CO2 19* 20* 22 18*  GLUCOSE 153* 183* 171* 183*  BUN 23 20 30* 47*  CREATININE 1.94* 2.07* 2.57* 3.37*  CALCIUM 8.9 8.7* 8.8* 8.7*   Liver Function Tests: No results for input(s): "AST", "ALT", "ALKPHOS", "BILITOT", "PROT", "ALBUMIN" in the last 168 hours. No results for input(s): "LIPASE", "AMYLASE" in the last 168 hours. No results for input(s): "AMMONIA" in the last 168 hours. CBC: Recent Labs  Lab 12/07/22 2130 12/08/22 0156 12/09/22 0133 12/10/22 0254  WBC 8.8 10.5 11.4* 10.8*  NEUTROABS 5.7  --  7.4 5.8  HGB 15.2 14.2 16.0 15.1  HCT 46.3 43.1 47.9 46.2  MCV 88.5 87.6 87.9 87.0  PLT 332 327 375 378   PT/INR: @LABRCNTIP (inr:5) Cardiac Enzymes: )No results for input(s): "CKTOTAL", "CKMB", "CKMBINDEX", "TROPONINI" in the last 168 hours. CBG: Recent Labs  Lab 12/09/22 1221 12/09/22 1557 12/09/22 2110 12/10/22 0740 12/10/22 1120  GLUCAP 249* 281* 196* 239* 274*    Iron Studies: No results for input(s): "IRON", "TIBC", "TRANSFERRIN", "FERRITIN" in the last 168 hours.  Xrays/Other Studies: VAS [place] LOWER EXTREMITY VENOUS (DVT)  Result Date: 12/09/2022  Lower Venous DVT Study Patient Name:  VAIDEN WEE  Date of Exam:   12/09/2022 Medical Rec #: [id number]            Accession #:    [id number] Date of Birth: 1940/11/21           Patient Gender: M Patient Age:   72 years Exam Location:  [facility] Procedure:      VAS [place] LOWER EXTREMITY VENOUS (DVT) Referring Phys: Montey Hora --------------------------------------------------------------------------------  Indications: SOB.  Risk Factors: Paroxysmal atrial fibrillation, heart failure. Comparison Study: No  prior study on file Performing Technologist: Sharion Dove RVS  Examination Guidelines: A complete evaluation includes B-mode imaging, spectral Doppler, color Doppler, and power Doppler as needed of all accessible portions of each vessel. Bilateral testing is considered an integral part of a complete examination. Limited examinations for reoccurring indications may be performed as noted. The reflux portion of the exam is performed with the patient in reverse Trendelenburg.  +---------+---------------+---------+-----------+----------+--------------+ RIGHT    CompressibilityPhasicitySpontaneityPropertiesThrombus Aging +---------+---------------+---------+-----------+----------+--------------+ CFV      Full           Yes      Yes                                 +---------+---------------+---------+-----------+----------+--------------+ SFJ      Full                                                        +---------+---------------+---------+-----------+----------+--------------+ FV Prox  Full                                                        +---------+---------------+---------+-----------+----------+--------------+  FV Mid   Full           Yes      Yes                                 +---------+---------------+---------+-----------+----------+--------------+ FV DistalFull                                                        +---------+---------------+---------+-----------+----------+--------------+ PFV      Full                                                        +---------+---------------+---------+-----------+----------+--------------+ POP      Full           Yes      Yes                                 +---------+---------------+---------+-----------+----------+--------------+ PTV      Full                                                        +---------+---------------+---------+-----------+----------+--------------+ PERO     Full                                                         +---------+---------------+---------+-----------+----------+--------------+   +---------+---------------+---------+-----------+----------+--------------+ LEFT     CompressibilityPhasicitySpontaneityPropertiesThrombus Aging +---------+---------------+---------+-----------+----------+--------------+ CFV      Full           Yes      Yes                                 +---------+---------------+---------+-----------+----------+--------------+ SFJ      Full                                                        +---------+---------------+---------+-----------+----------+--------------+ FV Prox  Full                                                        +---------+---------------+---------+-----------+----------+--------------+ FV Mid   Full           Yes      Yes                                 +---------+---------------+---------+-----------+----------+--------------+  FV DistalFull                                                        +---------+---------------+---------+-----------+----------+--------------+ PFV      Full                                                        +---------+---------------+---------+-----------+----------+--------------+ POP      Full           Yes      Yes                                 +---------+---------------+---------+-----------+----------+--------------+ PTV      Full                                                        +---------+---------------+---------+-----------+----------+--------------+ PERO     Full                                                        +---------+---------------+---------+-----------+----------+--------------+     Summary: BILATERAL: - No evidence of deep vein thrombosis seen in the lower extremities, bilaterally. -No evidence of popliteal cyst, bilaterally.   *See table(s) above for measurements and observations. Electronically  signed by Orlie Pollen on 12/09/2022 at 5:36:04 PM.    Final      Assessment/Plan:  AKI/CKD stage IIIb - likely due to over diuresis and relative hypotension.  Agree with stopping furosemide and gentle hydration, but would also stop trimethoprim which inhibits creatinine secretion and may be playing a role in the elevated Scr.  UA with moderate blood, leukocytes and protein.  Will order serologies if no improvement of SCr with IVF's.  Will order renal US.  Will continue to follow UOP and daily Scr.   Avoid nephrotoxic medications including NSAIDs and iodinated intravenous contrast exposure unless the latter is absolutely indicated.   Preferred narcotic agents for pain control are hydromorphone, fentanyl, and methadone. Morphine should not be used.  Avoid Baclofen and avoid oral sodium phosphate and magnesium citrate based laxatives / bowel preps.  Continue strict Input and Output monitoring.  Will monitor the patient closely with you and intervene or adjust therapy as indicated by changes in clinical status/labs  Acute hypoxic respiratory failure - likely copd exacerbation as no evidence of volume overload on CXR, CT scan and physical exam. H/o recurrent UTI's and urinary retention - would recommend following bladder scans daily RUL lung mass - spiculated and concerning for primary bronchogenic carcinoma.  Followed by Pulmonary. 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 Cardiology following.  HTN -  stable Hyponatremia - likely due to COPD exacerbation.   Governor Rooks Niya Behler 12/10/2022, 4:10 PM

## 2022-12-10 NOTE — Telephone Encounter (Signed)
Pharmacy Patient Advocate Encounter  Insurance verification completed.    The patient is insured through [employer]   The patient is currently admitted and ran test claims for the following: Lantus.  Copays and coinsurance results were relayed to Inpatient clinical team.

## 2022-12-10 NOTE — Progress Notes (Signed)
Mobility Specialist Progress Note:   12/10/22 1215  Mobility  Activity Ambulated with assistance in hallway  Level of Assistance Minimal assist, patient does 75% or more  Assistive Device Front wheel walker  Distance Ambulated (ft) 150 ft  Activity Response Tolerated well  Mobility Referral Yes  $Mobility charge 1 Mobility   Pt agreeable to mobility session. Required up to light modA to steady with ambulation d/t x1 LOB. 4LO2 required to maintain SpO2 >89%. Pt back in bed with all needs met.   Nelta Numbers Mobility Specialist Please contact via SecureChat or  Rehab office at [phone]

## 2022-12-10 NOTE — Care Management (Signed)
  Transition of Care [facility]) Screening Note   Patient Details  Name: Kevin [name] Date of Birth: November 22, 1940   Transition of [facility]) CM/SW Contact:    Bethena Roys, RN Phone Number: 12/10/2022, 3:41 PM    Transition of Care Department [facility]) has reviewed the patient and no TOC needs have been identified at this time. Patient presented for tachycardia and shortness of breath. PTA patient was from home with spouse. TOC will follow for oxygen needs. We will continue to monitor patient advancement through interdisciplinary progression rounds. If new patient transition needs arise, please place a TOC consult.

## 2022-12-10 NOTE — Progress Notes (Signed)
Progress Note   Patient: Kevin [name] [id number] DOB: 22-Feb-1941 DOA: 12/07/2022     2 DOS: the patient was seen and examined on 12/10/2022   Brief hospital course: Patient with h/o HTN, chronic diastolic CHF, afib on Eliquis s/p DCCV on 3/7, DM, prior CVA, stage 3b CKD, HLD, COPD, PVD s/p L TMA, and recent RUL lung mass who presenting with SOB. Home O2 sat 81% on RA, improved to 90% with home O2. No apparent evidence of infection or CHF on presentation.  Diuresed and euvolemic when seen by cardiology on 3/13.  Pulmonology also consulted.  Back in afib despite recent DCCV, possibly related to COPD and increasing O2 requirements, has home O2 that he doesn't use so needs to use it regularly. He is planned for possible biopsy of his lung mass as an outpatient.  Urine is quite cloudy, UA ordered with reflex.  Foot ulcer noted, wound care consulted and MRI ordered and positive for osteomyelitis of the medial cuneiform.  Discussed with [name] Given - he will need home antibiotics and f/u next week as scheduled for discussion about BKA.  Patient's kidney function is worsening with creatinine increased from 2.5-3.3.  Nephrologist consulted started on gentle IV fluids.  Assessment and Plan: * Acute on chronic respiratory failure with hypoxia (HCC) -Patient with known chronic periodic hypoxia for which he uses home O2 on a prn basis but not regularly -He has been persistently SOB, particularly with exertion -O2 sat 81% at home, 89% here -He underwent recent DCCV (3/7) and so thought on presentation was that this was related to CHF -Negative CXR, negative COVID, no real symptoms other than SOB -No DVT on imaging -He was given Lasix but appears to be euvolemic so will not continue -Repeat echo with preserved EF, ?RV overload/failure -Cardiology has consulted -Not c/w COPD exacerbation either given lack of cough -Instead, I am concerned that this SOB is related to his recently diagnosed lung mass and  underlying COPD that needs continuous rather than prn home O2 -Dr. Lamonte Sakai has consulted and will see next week to further discuss possible biopsy  CAD (coronary artery disease) -Seen on imaging -No chest pain reported during hospitalization  DNR (do not resuscitate) -I have discussed code status with the patient and he would not desire resuscitation and would prefer to die a natural death should that situation arise.   History of recurrent UTI (urinary tract infection) -Continue prophylactic trimethoprim prescribed by urology  Lung mass -Patient has been following with pulmonologist Dr. West Carbo outpatient for surveillance of a right upper lobe spiculated mass as well as left-sided pulmonary nodules. 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   -Has follow-up coming up with Dr. Lamonte Sakai to discuss further treatment but inpatient consult has been requested.  COPD (chronic obstructive pulmonary disease) (HCC) -No evidence of acute exacerbation at this time. -Continue Stiolto (Brovana and Incruse per formulary), Albuterol -He has home O2 but does not wear it regularly -This appears to be the primary reason for his current hospitalization, needs routine use of home O2 -Ambulatory pulse ox prior to dc -Patient and wife counseled -Has f/u with Dr. Lamonte Sakai on 3/22  Chronic kidney disease, stage 3b ([place]) -Slightly worse, likely related to diuresis -Attempt to avoid nephrotoxic medications -Hold Lasix for now -Was planning to dc on 3/14 but will keep one additional day, hold Lasix, and recheck renal function. It was checked and is worsening from 2.5-3.37.  Start  the patient on gentle IV fluids.  Nephrologist has been consulted.  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 Given, last seen about a month ago -MRI with osteo -Wound care consulted -I spoke with [name] Given - ok to treat with PO antibiotics (Augmentin, Doxy) and dc when appropriate -He has f/u scheduled for 1 week from now, 3/21 -He will need BKA  PAF (paroxysmal atrial fibrillation) ([place]) -Recent successful cardioversion on 3/7 outpatient with cardiology -In and out of afib during hospitalization -Recurrence is likely related to COPD/lung disease -Continue diltiazem -Metoprolol was held due to bradycardia immediately following his procedure but has been resumed without apparent difficulty -Continue Eliquis  Essential hypertension -Continue diltiazem -Resume beta blocker   Albuterol Lexparo Clarinin Eliquis Dilt Jardiance Lasix Mesalamine Toprol XL Ditropan Crestor Flomax Stiolto  Trimethoprim  Subjective: Seen and examined at bedside today.  Patient denies having any worsening of shortness of breath.  Currently on 4 L of oxygen via nasal cannula.  Review of labs shows worsening of creatinine.  Started on gentle IV fluids, nephrology consulted.   Physical Exam: Vitals:   12/10/22 0446 12/10/22 0742 12/10/22 0752 12/10/22 0812  BP: 118/73 97/67  109/73  Pulse:  70    Resp: 18 20    Temp: 97.8 F (36.6 C) (!) 97.4 F (36.3 C)    TempSrc: Oral Oral    SpO2:  94% 92%   Weight:      Height:       General:  Appears calm and comfortable and is in NAD, on 3L [place] O2 Eyes:  EOMI, normal lids, iris ENT:  grossly normal hearing, lips & tongue, mmm Neck:  no LAD, masses or thyromegaly, no JVD Cardiovascular:  RR with mild tachycardia, no m/r/g. No LE edema.  Respiratory:   CTA bilaterally with no wheezes/rales/rhonchi.  Normal respiratory effort. Abdomen:   soft, NT, ND; + umbilical hernia (chronic) Skin:  ulcer along L plantar foot Musculoskeletal:  grossly normal tone BUE/BLE, good ROM, no bony abnormality, s/p L TMA Psychiatric:  grossly normal mood and affect, speech fluent and appropriate, AOx3 Neurologic:  CN 2-12 grossly intact, moves all extremities in coordinated fashion   Radiological Exams on Admission: Independently reviewed - see discussion in A/P where applicable  VAS [place] LOWER EXTREMITY VENOUS (DVT)  Result Date: 12/09/2022  Lower Venous DVT Study Patient Name:  EASTYN EVANOFF  Date of Exam:   12/09/2022 Medical Rec #: [id number]            Accession #:    [id number] Date of Birth: Dec 28, 1940           Patient Gender: M Patient Age:   82 years Exam Location:  [facility] Procedure:      VAS [place] LOWER EXTREMITY VENOUS (DVT) Referring Phys: Ane Payment DESAI --------------------------------------------------------------------------------  Indications: SOB.  Risk Factors: Paroxysmal atrial fibrillation, heart failure. Comparison Study: No prior study on file Performing Technologist: Sharion Dove RVS  Examination Guidelines: A complete evaluation includes B-mode imaging, spectral Doppler, color Doppler, and power Doppler as needed of all accessible portions of each vessel. Bilateral testing is considered an  integral part of a complete examination. Limited examinations for reoccurring indications may be performed as noted. The reflux portion of the exam is performed with the patient in reverse Trendelenburg.  +---------+---------------+---------+-----------+----------+--------------+ RIGHT    CompressibilityPhasicitySpontaneityPropertiesThrombus Aging +---------+---------------+---------+-----------+----------+--------------+ CFV      Full           Yes      Yes                                 +---------+---------------+---------+-----------+----------+--------------+ SFJ      Full                                                         +---------+---------------+---------+-----------+----------+--------------+ FV Prox  Full                                                        +---------+---------------+---------+-----------+----------+--------------+ FV Mid   Full           Yes      Yes                                 +---------+---------------+---------+-----------+----------+--------------+ FV DistalFull                                                        +---------+---------------+---------+-----------+----------+--------------+ PFV      Full                                                        +---------+---------------+---------+-----------+----------+--------------+ POP      Full           Yes      Yes                                 +---------+---------------+---------+-----------+----------+--------------+ PTV      Full                                                        +---------+---------------+---------+-----------+----------+--------------+ PERO     Full                                                        +---------+---------------+---------+-----------+----------+--------------+   +---------+---------------+---------+-----------+----------+--------------+ LEFT     CompressibilityPhasicitySpontaneityPropertiesThrombus Aging +---------+---------------+---------+-----------+----------+--------------+ CFV      Full  Yes      Yes                                 +---------+---------------+---------+-----------+----------+--------------+ SFJ      Full                                                        +---------+---------------+---------+-----------+----------+--------------+ FV Prox  Full                                                        +---------+---------------+---------+-----------+----------+--------------+ FV Mid   Full           Yes      Yes                                  +---------+---------------+---------+-----------+----------+--------------+ FV DistalFull                                                        +---------+---------------+---------+-----------+----------+--------------+ PFV      Full                                                        +---------+---------------+---------+-----------+----------+--------------+ POP      Full           Yes      Yes                                 +---------+---------------+---------+-----------+----------+--------------+ PTV      Full                                                        +---------+---------------+---------+-----------+----------+--------------+ PERO     Full                                                        +---------+---------------+---------+-----------+----------+--------------+     Summary: BILATERAL: - No evidence of deep vein thrombosis seen in the lower extremities, bilaterally. -No evidence of popliteal cyst, bilaterally.   *See table(s) above for measurements and observations. Electronically signed by Orlie Pollen on 12/09/2022 at 5:36:04 PM.    Final    MR FOOT LEFT W WO CONTRAST  Result Date: 12/08/2022 CLINICAL DATA:  Plantar foot ulcer.  History of prior amputations. EXAM: MRI OF THE LEFT FOREFOOT  WITHOUT AND WITH CONTRAST TECHNIQUE: Multiplanar, multisequence MR imaging of the left foot was performed both before and after administration of intravenous contrast. 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 There is a deep open wound on the plantar aspect of the foot extending down to the bone. Abnormal STIR signal intensity and enhancement in the medial cuneiform consistent with osteomyelitis. No findings suspicious for septic arthritis. No drainable soft tissue abscess or pyomyositis. IMPRESSION: 1. Deep open wound on the plantar  aspect of the foot extending down to the bone. 2. Osteomyelitis involving the medial cuneiform. 3. No findings for septic arthritis or soft tissue abscess. Electronically Signed   By: Marijo Sanes M.D.   On: 12/08/2022 15:58    EKG: Independently reviewed.  Afib with rate 86; prolonged QTc 502; RBBB, LAFB    Labs on Admission: I have personally reviewed the available labs and imaging studies at the time of the admission.  Pertinent labs:  Na++ 130 Glucose 171 BUN 30/Creatinine 2.57/GFR 25; 32/1.96/34 on 2/29 WBC 11.4    Family Communication: Wife was present for the majority of the evaluation  Disposition: Status is: Inpatient Remains inpatient appropriate because: slightly worse creatinine today, ?overdiuresis  Planned Discharge Destination: Home    Time spent: 50 minutes  Author: Carlyle Lipa, MD 12/10/2022 3:44 PM  For on call review [email or web address].

## 2022-12-11 ENCOUNTER — Inpatient Hospital Stay (HOSPITAL_COMMUNITY): Payer: Medicare Other

## 2022-12-11 DIAGNOSIS — N179 Acute kidney failure, unspecified: Secondary | ICD-10-CM | POA: Diagnosis not present

## 2022-12-11 DIAGNOSIS — I639 Cerebral infarction, unspecified: Secondary | ICD-10-CM

## 2022-12-11 DIAGNOSIS — I2510 Atherosclerotic heart disease of native coronary artery without angina pectoris: Secondary | ICD-10-CM

## 2022-12-11 DIAGNOSIS — Z66 Do not resuscitate: Secondary | ICD-10-CM

## 2022-12-11 DIAGNOSIS — I10 Essential (primary) hypertension: Secondary | ICD-10-CM | POA: Diagnosis not present

## 2022-12-11 DIAGNOSIS — M86172 Other acute osteomyelitis, left ankle and foot: Secondary | ICD-10-CM

## 2022-12-11 DIAGNOSIS — N1330 Unspecified hydronephrosis: Secondary | ICD-10-CM | POA: Insufficient documentation

## 2022-12-11 DIAGNOSIS — I480 Paroxysmal atrial fibrillation: Secondary | ICD-10-CM | POA: Diagnosis not present

## 2022-12-11 DIAGNOSIS — I5033 Acute on chronic diastolic (congestive) heart failure: Secondary | ICD-10-CM | POA: Diagnosis not present

## 2022-12-11 DIAGNOSIS — J9621 Acute and chronic respiratory failure with hypoxia: Secondary | ICD-10-CM | POA: Diagnosis not present

## 2022-12-11 DIAGNOSIS — E11621 Type 2 diabetes mellitus with foot ulcer: Secondary | ICD-10-CM

## 2022-12-11 DIAGNOSIS — L97523 Non-pressure chronic ulcer of other part of left foot with necrosis of muscle: Secondary | ICD-10-CM

## 2022-12-11 DIAGNOSIS — E871 Hypo-osmolality and hyponatremia: Secondary | ICD-10-CM

## 2022-12-11 LAB — CBC WITH DIFFERENTIAL/PLATELET
Abs Immature Granulocytes: 0.04 10*3/uL (ref 0.00–0.07)
Basophils Absolute: 0.1 10*3/uL (ref 0.0–0.1)
Basophils Relative: 1 %
Eosinophils Absolute: 0.3 10*3/uL (ref 0.0–0.5)
Eosinophils Relative: 3 %
HCT: 44.2 % (ref 39.0–52.0)
Hemoglobin: 14.7 g/dL (ref 13.0–17.0)
Immature Granulocytes: 0 %
Lymphocytes Relative: 24 %
Lymphs Abs: 2.6 10*3/uL (ref 0.7–4.0)
MCH: 28.5 pg (ref 26.0–34.0)
MCHC: 33.3 g/dL (ref 30.0–36.0)
MCV: 85.7 fL (ref 80.0–100.0)
Monocytes Absolute: 1.2 10*3/uL — ABNORMAL HIGH (ref 0.1–1.0)
Monocytes Relative: 11 %
Neutro Abs: 6.5 10*3/uL (ref 1.7–7.7)
Neutrophils Relative %: 61 %
Platelets: 365 10*3/uL (ref 150–400)
RBC: 5.16 MIL/uL (ref 4.22–5.81)
RDW: 14.5 % (ref 11.5–15.5)
WBC: 10.6 10*3/uL — ABNORMAL HIGH (ref 4.0–10.5)
nRBC: 0 % (ref 0.0–0.2)

## 2022-12-11 LAB — COMPREHENSIVE METABOLIC PANEL
ALT: 65 U/L — ABNORMAL HIGH (ref 0–44)
AST: 92 U/L — ABNORMAL HIGH (ref 15–41)
Albumin: 2.7 g/dL — ABNORMAL LOW (ref 3.5–5.0)
Alkaline Phosphatase: 132 U/L — ABNORMAL HIGH (ref 38–126)
Anion gap: 11 (ref 5–15)
BUN: 69 mg/dL — ABNORMAL HIGH (ref 8–23)
CO2: 20 mmol/L — ABNORMAL LOW (ref 22–32)
Calcium: 8.5 mg/dL — ABNORMAL LOW (ref 8.9–10.3)
Chloride: 95 mmol/L — ABNORMAL LOW (ref 98–111)
Creatinine, Ser: 3.45 mg/dL — ABNORMAL HIGH (ref 0.61–1.24)
GFR, Estimated: 17 mL/min — ABNORMAL LOW (ref >60)
Glucose, Bld: 211 mg/dL — ABNORMAL HIGH (ref 70–99)
Potassium: 4.1 mmol/L (ref 3.5–5.1)
Sodium: 126 mmol/L — ABNORMAL LOW (ref 135–145)
Total Bilirubin: 1 mg/dL (ref 0.3–1.2)
Total Protein: 7.6 g/dL (ref 6.5–8.1)

## 2022-12-11 LAB — GLUCOSE, CAPILLARY
Glucose-Capillary: 261 mg/dL — ABNORMAL HIGH (ref 70–99)
Glucose-Capillary: 262 mg/dL — ABNORMAL HIGH (ref 70–99)
Glucose-Capillary: 224 mg/dL — ABNORMAL HIGH (ref 70–99)
Glucose-Capillary: 253 mg/dL — ABNORMAL HIGH (ref 70–99)

## 2022-12-11 MED ORDER — IPRATROPIUM-ALBUTEROL 0.5-2.5 (3) MG/3ML IN SOLN: 3.0000 mL | RESPIRATORY_TRACT | Status: DC | PRN

## 2022-12-11 MED ORDER — INSULIN ASPART 100 UNIT/ML IJ SOLN
5.0000 [IU] | Freq: Three times a day (TID) | INTRAMUSCULAR | Status: DC
  Administered 2022-12-11 – 2022-12-12 (×4): 5 [IU] via SUBCUTANEOUS

## 2022-12-11 MED ORDER — SENNOSIDES-DOCUSATE SODIUM 8.6-50 MG PO TABS
2.0000 | ORAL_TABLET | Freq: Two times a day (BID) | ORAL | Status: DC | PRN
  Administered 2022-12-11: 2 via ORAL
  Filled 2022-12-11: qty 2

## 2022-12-11 MED ORDER — SODIUM CHLORIDE 0.9 % IV SOLN
2.0000 g | INTRAVENOUS | Status: DC
  Administered 2022-12-11 – 2022-12-12 (×2): 2 g via INTRAVENOUS
  Filled 2022-12-11 (×3): qty 20

## 2022-12-11 MED ORDER — INSULIN GLARGINE-YFGN 100 UNIT/ML ~~LOC~~ SOLN
15.0000 [IU] | Freq: Two times a day (BID) | SUBCUTANEOUS | Status: DC
  Administered 2022-12-11 – 2022-12-12 (×2): 15 [IU] via SUBCUTANEOUS
  Filled 2022-12-11 (×3): qty 0.15

## 2022-12-11 MED ORDER — POLYETHYLENE GLYCOL 3350 17 G PO PACK
17.0000 g | PACK | Freq: Two times a day (BID) | ORAL | Status: DC | PRN
  Filled 2022-12-11: qty 1

## 2022-12-11 NOTE — Progress Notes (Addendum)
PROGRESS NOTE  Kevin [name] [id number] DOB: [birth year]   PCP: [name], MD  Patient is from: Home.  Lives with his wife.  DOA: 12/07/2022 LOS: 3  Chief complaints Chief Complaint  Patient presents with   Tachycardia     Brief Narrative / Interim history: 82 year old M with PMH of COPD/chronic hypoxic RF on 3 L as needed, diastolic CHF, [id number], recent RUL mass, A-fib s/p DCCV on 3/7 on Eliquis, DM-2 with left foot ulcer, CVA, PUD s/p left TMA and BPH presenting with shortness of breath and hypoxemia to 81 on room air which has improved to 90% after returning home oxygen.  He was admitted for acute on chronic respiratory failure with hypoxia in the setting of COPD and CHF exacerbation and A-fib.  He was back in A-fib after DCCV.  Initially started on IV diuretics.  Cardiology consulted.  Diuretics discontinued due to AKI.   Nephrology consulted.  Renal US showed severe bilateral hydroureteronephrosis.  Foley catheter placed.  Urology consulted and recommended CT renal stone study.  Pulmonology consulted for COPD and RUL mass.  There is plan for outpatient biopsy of RUL mass by pulmonology.  Patient has diabetic foot ulcer.  MRI showed osteomyelitis.  Orthopedic surgery, [name] Given consulted and recommended p.o. Augmentin and doxycycline until his follow-up on 3/21 for possible BKA.     Subjective: Seen and examined earlier this morning.  No major events overnight of this morning.  Denies chest pain, shortness of breath, GI or UTI symptoms although he reports white cloudy urine and sense of incomplete void going on for about 2 weeks.  He says he has a urine test at urology office that showed yeast.  Denies dysuria, frequency or urgency.  Denies suprapubic pain or tenderness or new back pain.  Objective: Vitals:   12/10/22 2158 12/11/22 0016 12/11/22 0452 12/11/22 0804  BP:  112/71 106/62 102/67  Pulse:  64 64 75  Resp: 18 18 18 20   Temp: 97.7 F (36.5 C) 97.6 F (36.4 C)  98.2 F (36.8 C) 97.8 F (36.6 C)  TempSrc: Oral Oral Oral Oral  SpO2:  94% 95% (!) 85%  Weight:   97.8 kg   Height:        Examination:  GENERAL: No apparent distress.  Nontoxic. HEENT: MMM.  Vision and hearing grossly intact.  NECK: Supple.  No apparent JVD.  RESP:  No IWOB.  Fair aeration bilaterally. CVS:  RRR. Heart sounds normal.  ABD/GI/GU: BS+. Abd soft, NTND.  MSK/EXT:  Moves extremities. No apparent deformity. No edema.  SKIN: ulcer along L plantar foot  NEURO: Awake, alert and oriented appropriately.  No apparent focal neuro deficit. PSYCH: Calm. Normal affect.   Procedures:  None  Microbiology summarized: [id number], influenza and RSV PCR nonreactive Urine culture pending  Assessment and plan: Principal Problem:   Acute on chronic respiratory failure with hypoxia (HCC) Active Problems:   Essential hypertension   Osteomyelitis (HCC)   Cerebrovascular accident (CVA) ([place])   Benign essential HTN   PAF (paroxysmal atrial fibrillation) (HCC)   AKI (acute kidney injury) ([place])   Diabetic foot ulcer (HCC)   Insulin dependent type 2 diabetes mellitus (HCC)   Acute on chronic diastolic CHF (congestive heart failure) (HCC)   Chronic kidney disease, stage 3b (HCC)   COPD (chronic obstructive pulmonary disease) (HCC)   Mass of upper lobe of right lung   History of recurrent UTI (urinary tract infection)   DNR (do not resuscitate)   CAD (  coronary artery disease)   Bilateral hydronephrosis  Acute on chronic respiratory failure with hypoxia: Multifactorial including COPD, CHF and possibly underlying RUL mass.  Low suspicion for PE or pneumonia.  CT chest without contrast with a large RUL nodule, unchanged left-sided pulmonary nodule, right adrenal adenoma and possible esophageal dysmotility/GERD.  BNP elevated to 475.  Initially diuresed with IV Lasix for CHF.  -Supplemental oxygen as needed -Incentive spirometry, OOB -Bronchodilators -Appreciate help by  pulmonology  Acute on chronic diastolic CHF: Initially diuresed with IV Lasix due to concern for CHF exacerbation.  BNP was elevated but no pulmonary edema on chest x-ray.  Diuretics discontinued due to AKI.  TTE with LVEF of 55 to 60%, indeterminate DD and RVSP of 30.5 mmHg. -Continue holding diuretics -Strict intake and output, daily weight, renal functions and electrolytes -Appreciate help by cardiology  Paroxysmal A-fib: S/p DCCV on 3/7.  Back in A-fib but rate controlled. -Continue Cardizem, metoprolol and Eliquis -Optimize electrolytes -Continue telemetry monitoring -Appreciate help by cardiology   AKI on CKD-3B: Initially felt to be due to diuretics.  Renal US with severe bilateral hydronephrosis.  UA concerning for UTI.  Patient reports sense of incomplete void and white urine for about 2 weeks.  He states he had urine culture at urology office that showed a yeast about 2 weeks ago. Recent Labs    11/25/22 1213 12/07/22 2130 12/08/22 0156 12/09/22 0133 12/10/22 0254 12/11/22 0104  BUN 32* 23 20 30* 47* 69*  CREATININE 1.96* 1.94* 2.07* 2.57* 3.37* 3.45*  -Nephrology following -Urology consulted and recommended Foley and CT renal stone study -Follow urine culture-currently on Doxy and Augmentin for osteomyelitis.  Left foot ulcer and osteomyelitis: Had left TMA for PVD in the past.  Has Wagner ulcer along the left anterior foot.  Followed by [name] Given.  MRI concerning for osteomyelitis. -[name] Given consulted and recommended p.o. Augmentin and doxycycline until his follow-up on 3/21 -Will reach out to [name] Given if patient remains in-house -Check CRP and ESR -Optimize glycemic control  Severe bilateral hydronephrosis/BPH with LUTS: Reports sense of incomplete void and white urine for about 2 weeks.  He says he has urine culture at urology office that showed yeast.  He is followed by Dr. Gloriann Loan. -Dr. Gloriann Loan consulted and recommended Foley catheter and CT renal stone study -Follow  urine culture -Continue home tamsulosin -Not sure if he needs to be on oxybutynin but defer to urology  History of CAD: Stable.  No chest pain.  TTE without RWMA. -Continue home meds  Chronic COPD: Doubt exacerbation. -Continue bronchodilators and inhalers -Pulmonology on board  Uncontrolled IDDM-2 with CKD-3B, hyperglycemia and diabetic foot ulcer: A1c 10.2%. Recent Labs  Lab 12/10/22 0740 12/10/22 1120 12/10/22 1619 12/10/22 2155 12/11/22 0802  GLUCAP 239* 274* 323* 263* 261*  -Hold home Jardiance-high risk for UTI. -Increase Semglee from 12 to 15 units twice daily -Continue SSI-moderate -Add NovoLog 5 units 3 times daily with meals -Continue home Crestor  Pulmonary nodule/mass: Followed by pulmonology, Dr. Lamonte Sakai.  Outpatient imaging showed RUL spiculated mass as well as left-sided pulmonary nodules.  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  Has upcoming appointment with Dr. Lamonte Sakai -Pulmonology on board.  Essential hypertension: Normotensive for most part. -Continue metoprolol and Cardizem  Hyponatremia: Likely due to renal failure and possible SIADH.  Urine sodium elevated but after diuretics. -Continue monitoring  Physical deconditioning -OOB/PT/OT  History of CVA: Stable.  No focal neurodeficit.   Goal of care:  DNR/DNI.  Body mass index is 27.68 kg/m.           DVT prophylaxis:   apixaban (ELIQUIS) tablet 5 mg  Code Status: DNR/DNI Family Communication: None at bedside Level of care: Telemetry Cardiac Status is: Inpatient Remains inpatient appropriate because: Respiratory failure, AKI, urinary obstruction and hyponatremia   Final disposition: TBD Consultants:  Cardiology, pulmonology, nephrology, urology, Ortho  55 minutes with more than 50% spent in reviewing records, counseling patient/family and coordinating care.   Sch Meds:  Scheduled Meds:  amoxicillin-clavulanate  1 tablet Oral Q12H   apixaban  5  mg Oral BID   diltiazem  120 mg Oral Daily   doxycycline  100 mg Oral Q12H   insulin aspart  0-15 Units Subcutaneous TID PC & HS   insulin aspart  5 Units Subcutaneous TID WC   insulin glargine-yfgn  15 Units Subcutaneous BID   leptospermum manuka honey  1 Application Topical Daily   mesalamine  1.2 g Oral BID   metoprolol tartrate  50 mg Oral BID   oxybutynin  5 mg Oral QHS   rosuvastatin  5 mg Oral Daily   tamsulosin  0.4 mg Oral q12n4p   trimethoprim  100 mg Oral QHS   umeclidinium-vilanterol  1 puff Inhalation Daily   Continuous Infusions: PRN Meds:.ipratropium-albuterol, lidocaine, melatonin  Antimicrobials: Anti-infectives (From admission, onward)    Start     Dose/Rate Route Frequency Ordered Stop   12/09/22 2200  amoxicillin-clavulanate (AUGMENTIN) 500-125 MG per tablet 1 tablet        1 tablet Oral Every 12 hours 12/09/22 1500     12/09/22 2200  doxycycline (VIBRA-TABS) tablet 100 mg        100 mg Oral Every 12 hours 12/09/22 1500     12/08/22 0200  trimethoprim (TRIMPEX) tablet 100 mg        100 mg Oral Daily at bedtime 12/08/22 0102          I have personally reviewed the following labs and images: CBC: Recent Labs  Lab 12/07/22 2130 12/08/22 0156 12/09/22 0133 12/10/22 0254 12/11/22 0104  WBC 8.8 10.5 11.4* 10.8* 10.6*  NEUTROABS 5.7  --  7.4 5.8 6.5  HGB 15.2 14.2 16.0 15.1 14.7  HCT 46.3 43.1 47.9 46.2 44.2  MCV 88.5 87.6 87.9 87.0 85.7  PLT 332 327 375 378 365   BMP &GFR Recent Labs  Lab 12/07/22 2130 12/08/22 0156 12/09/22 0133 12/10/22 0254 12/11/22 0104  NA 130* 132* 130* 125* 126*  K 4.7 3.7 4.4 4.1 4.1  CL 97* 100 96* 95* 95*  CO2 19* 20* 22 18* 20*  GLUCOSE 153* 183* 171* 183* 211*  BUN 23 20 30* 47* 69*  CREATININE 1.94* 2.07* 2.57* 3.37* 3.45*  CALCIUM 8.9 8.7* 8.8* 8.7* 8.5*  MG  --   --  2.2  --   --    Estimated Creatinine Clearance: 20.2 mL/min (A) (by C-G formula based on SCr of 3.45 mg/dL (H)). Liver & Pancreas: Recent  Labs  Lab 12/11/22 0104  AST 92*  ALT 65*  ALKPHOS 132*  BILITOT 1.0  PROT 7.6  ALBUMIN 2.7*   No results for input(s): "LIPASE", "AMYLASE" in the last 168 hours. No results for input(s): "AMMONIA" in the last 168 hours. Diabetic: No results for input(s): "HGBA1C" in the last 72 hours. Recent Labs  Lab 12/10/22 0740 12/10/22 1120 12/10/22 1619 12/10/22 2155 12/11/22 0802  GLUCAP 239* 274* 323* 263* 261*   Cardiac Enzymes:  No results for input(s): "CKTOTAL", "CKMB", "CKMBINDEX", "TROPONINI" in the last 168 hours. No results for input(s): "PROBNP" in the last 8760 hours. Coagulation Profile: No results for input(s): "INR", "PROTIME" in the last 168 hours. Thyroid Function Tests: No results for input(s): "TSH", "T4TOTAL", "FREET4", "T3FREE", "THYROIDAB" in the last 72 hours. Lipid Profile: No results for input(s): "CHOL", "HDL", "LDLCALC", "TRIG", "CHOLHDL", "LDLDIRECT" in the last 72 hours. Anemia Panel: No results for input(s): "VITAMINB12", "FOLATE", "FERRITIN", "TIBC", "IRON", "RETICCTPCT" in the last 72 hours. Urine analysis:    Component Value Date/Time   COLORURINE YELLOW 12/10/2022 1810   APPEARANCEUR TURBID (A) 12/10/2022 1810   LABSPEC 1.012 12/10/2022 1810   PHURINE 5.0 12/10/2022 1810   GLUCOSEU 150 (A) 12/10/2022 1810   HGBUR SMALL (A) 12/10/2022 1810   BILIRUBINUR NEGATIVE 12/10/2022 1810   KETONESUR NEGATIVE 12/10/2022 1810   PROTEINUR 100 (A) 12/10/2022 1810   NITRITE NEGATIVE 12/10/2022 1810   LEUKOCYTESUR MODERATE (A) 12/10/2022 1810   Sepsis Labs: Invalid input(s): "PROCALCITONIN", "LACTICIDVEN"  Microbiology: Recent Results (from the past 240 hour(s))  Resp panel by RT-PCR (RSV, Flu A&B, Covid) Anterior Nasal Swab     Status: None   Collection Time: 12/08/22  1:08 AM   Specimen: Anterior Nasal Swab  Result Value Ref Range Status   SARS Coronavirus 2 by RT PCR NEGATIVE NEGATIVE Final   Influenza A by PCR NEGATIVE NEGATIVE Final   Influenza B  by PCR NEGATIVE NEGATIVE Final    Comment: (NOTE) The Xpert Xpress SARS-CoV-2/FLU/RSV plus assay is intended as an aid in the diagnosis of influenza from Nasopharyngeal swab specimens and should not be used as a sole basis for treatment. Nasal washings and aspirates are unacceptable for Xpert Xpress SARS-CoV-2/FLU/RSV testing.  Fact Sheet for Patients: EntrepreneurPulse.com.au  Fact Sheet for Healthcare Providers: IncredibleEmployment.be  This test is not yet approved or cleared by the [place] FDA and has been authorized for detection and/or diagnosis of SARS-CoV-2 by FDA under an Emergency Use Authorization (EUA). This EUA will remain in effect (meaning this test can be used) for the duration of the COVID-19 declaration under Section 564(b)(1) of the Act, 21 U.S.C. section 360bbb-3(b)(1), unless the authorization is terminated or revoked.     Resp Syncytial Virus by PCR NEGATIVE NEGATIVE Final    Comment: (NOTE) Fact Sheet for Patients: EntrepreneurPulse.com.au  Fact Sheet for Healthcare Providers: IncredibleEmployment.be  This test is not yet approved or cleared by the [place] FDA and has been authorized for detection and/or diagnosis of SARS-CoV-2 by FDA under an Emergency Use Authorization (EUA). This EUA will remain in effect (meaning this test can be used) for the duration of the COVID-19 declaration under Section 564(b)(1) of the Act, 21 U.S.C. section 360bbb-3(b)(1), unless the authorization is terminated or revoked.  Performed at [facility], [place] [address] [place], [place] [address]   Urine Culture     Status: None (Preliminary result)   Collection Time: 12/10/22  6:10 PM   Specimen: Urine, Random  Result Value Ref Range Status   Specimen Description URINE, RANDOM  Final   Special Requests   Final    URINE, CLEAN CATCH Performed at [facility], [place] [address], [place], [place] [address]    Culture PENDING  Incomplete   Report Status PENDING  Incomplete    Radiology Studies: US RENAL  Result Date: 12/11/2022 CLINICAL DATA:  Acute kidney injury. EXAM: RENAL / URINARY TRACT ULTRASOUND COMPLETE COMPARISON:  August 20, 2019 FINDINGS:  Right Kidney: Renal measurements: 11.6 cm x 6.8 cm x 5.4 cm = volume: 220.6 mL. Echogenicity within normal limits. No mass is visualized. There is marked severity right-sided hydronephrosis and hydroureter to the level of the urinary bladder. This is increased in severity when compared to the prior study. Left Kidney: Renal measurements: 10.5 cm x 6.4 cm x 6.2 cm = volume: 217.6 mL. Echogenicity within normal limits. No mass is visualized. There is marked severity left-sided hydronephrosis and hydroureter to the level of the urinary bladder. This is increased in severity when compared to the prior study. Bladder: A moderate amount of heterogeneous echogenic material is seen within the dependent portion of the urinary bladder. No abnormal flow is seen within this region on color Doppler evaluation. The right and left ureteral jets are not clearly identified. Other: The prostate gland is enlarged and measures 7.1 cm x 3.0 cm x 4.8 cm (volume 53.87 mL). IMPRESSION: 1. Marked severity bilateral hydronephrosis and hydroureter to the level of the urinary bladder. 2. Echogenic material within the urinary bladder lumen which may represent blood products. Correlation with urinalysis is recommended. Electronically Signed   By: Virgina Norfolk M.D.   On: 12/11/2022 05:30      Alva Kuenzel T. [place]  If 7PM-7AM, please contact night-coverage [email or web address] 12/11/2022, 10:48 AM

## 2022-12-11 NOTE — Progress Notes (Signed)
Admit: 12/07/2022 LOS: 3  72M AKI from obstructive process  Subjective:  Renal ultrasound with bilateral hydronephrosis earlier this morning Has been seen by urology and Foley catheter placed with purulent urine draining Creatinine worsened to 3.45, BUN 69.  Serum sodium is 126. Vital signs are stable.  03/15 0701 - 03/16 0700 In: 0  Out: 400 [Urine:400]  Filed Weights   12/07/22 2137 12/09/22 0500 12/11/22 0452  Weight: 99.8 kg 95.1 kg 97.8 kg    Scheduled Meds:  amoxicillin-clavulanate  1 tablet Oral Q12H   apixaban  5 mg Oral BID   diltiazem  120 mg Oral Daily   doxycycline  100 mg Oral Q12H   insulin aspart  0-15 Units Subcutaneous TID PC & HS   insulin aspart  5 Units Subcutaneous TID WC   insulin glargine-yfgn  15 Units Subcutaneous BID   leptospermum manuka honey  1 Application Topical Daily   mesalamine  1.2 g Oral BID   metoprolol tartrate  50 mg Oral BID   oxybutynin  5 mg Oral QHS   rosuvastatin  5 mg Oral Daily   tamsulosin  0.4 mg Oral q12n4p   trimethoprim  100 mg Oral QHS   umeclidinium-vilanterol  1 puff Inhalation Daily   Continuous Infusions: PRN Meds:.ipratropium-albuterol, lidocaine, melatonin  Current Labs: reviewed  3/15 UCx Pending  Physical Exam:  Blood pressure 102/67, pulse 75, temperature 97.8 F (36.6 C), temperature source Oral, resp. rate 20, height 6\' 2"  (1.88 m), weight 97.8 kg, SpO2 (!) 85 %. Elderly, chronically ill-appearing Foley catheter draining cloudy/purulent debris Regular, normal S1 and S2 Clear bilaterally Nonfocal  A AKI on CKD 3 secondary to obstructive uropathy now with Foley catheter.  Likely UTI, culture pending. Obstructive uropathy status post Foley catheter 3/16 with urology History of TURP with high residuals performing CIC at home periodically Hyponatremia related to impaired free water excretion, not hypoosmolar Left foot ulcer with underlying OM on outpatient Augmentin and doxycycline History of CAD DM2  would not resume SGLT2 inhibitor Pulmonary nodule/mass outpatient follow-up Hypertension, blood pressure stable  P Expect improvement in GFR with proper Foley function Will check in again tomorrow Consider broadening antibiotics, hopefully urine culture will provide guidance Medication Issues; Preferred narcotic agents for pain control are hydromorphone, fentanyl, and methadone. Morphine should not be used.  Baclofen should be avoided Avoid oral sodium phosphate and magnesium citrate based laxatives / bowel preps    Pearson Grippe MD 12/11/2022, 11:54 AM  Recent Labs  Lab 12/09/22 0133 12/10/22 0254 12/11/22 0104  NA 130* 125* 126*  K 4.4 4.1 4.1  CL 96* 95* 95*  CO2 22 18* 20*  GLUCOSE 171* 183* 211*  BUN 30* 47* 69*  CREATININE 2.57* 3.37* 3.45*  CALCIUM 8.8* 8.7* 8.5*   Recent Labs  Lab 12/09/22 0133 12/10/22 0254 12/11/22 0104  WBC 11.4* 10.8* 10.6*  NEUTROABS 7.4 5.8 6.5  HGB 16.0 15.1 14.7  HCT 47.9 46.2 44.2  MCV 87.9 87.0 85.7  PLT 375 378 365

## 2022-12-11 NOTE — Progress Notes (Signed)
Foley Cath placed by Dr. Gloriann Loan around [phone] per patient.

## 2022-12-11 NOTE — Progress Notes (Signed)
NAME:  Kevin [name], MRN:  [id number], DOB:  10/05/1940, LOS: 3 ADMISSION DATE:  12/07/2022, CONSULTATION DATE:  12/08/22 REFERRING MD:  Lorin Mercy CHIEF COMPLAINT:  Dyspnea   History of Present Illness:  Kevin [name] is a 82 y.o. male who has a PMH as below including but not limited to emphysema/COPD, RUL nodule, PAF on Eliquis, HFpEF, HTN, CKD, DM, CVA. He presented to [facility] ED 3/12 with dyspnea. He apparently had DCCV on 3/7 and had been doing fairly well immediately after. Around 3/9, he started feeling dyspnea mainly with exertion. This progressed and on 3/12, he noticed his pulse ox showed HR in the 120s and O2 in 80s. He and wife called cardiology office who instructed them to come to ED.  He was found to have recurrent A.fib along with acute hypoxic respiratory failure with sats in the 80s. He was placed on 3L O2.   He says that he has an O2 concentrator at home but doesn't need to use it much. He has used it intermittently and last was roughly 2 weeks ago. He denies any fevers/chills/sweats, chest pain, cough, N/V/D, abd pain, myalgias, exposures to known sick contacts. He says he has been sitting out on his porch a lot lately. Besides a runny nose, no additional symptoms.  In ED, CXR was negative. BNP mildly up at 400s. CT chest was also obtained and was negative for PNA but showed similar to slightly enlarged RUL nodule. Echo was performed and results are pending.  Given his new O2 requirement, PCCM asked to see in consultation.  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[place]); A-fib ([place]); Cerebrovascular accident (CVA) ([place]); Benign essential HTN; Diabetes mellitus type 2 in nonobese [facility]); Hypokalemia; PAF (paroxysmal atrial fibrillation) ([place]);  Leukocytosis; SIRS (systemic inflammatory response syndrome) ([place]); AKI (acute kidney injury) ([place]); Status post transmetatarsal amputation of left foot ([place]); Postoperative pain; Benign prostatic hyperplasia with urinary retention; New onset atrial fibrillation ([place]); Incontinence of feces; History of amputation of left foot ([place]); 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 ([place]); Diabetic foot ulcer ([place]); Debility; Urinary retention with incomplete bladder emptying; Renal insufficiency; Insulin dependent type 2 diabetes mellitus ([place]); Dehiscence of amputation stump ([place]); UTI (urinary tract infection); Acute on chronic diastolic CHF (congestive heart failure) ([place]); HLD (hyperlipidemia); Pneumonia due to COVID-19 virus; Chronic kidney disease, stage 3b ([place]); Chronic respiratory failure ([place]); H/O ulcerative colitis; COPD (chronic obstructive pulmonary disease) ([place]); Postinflammatory pulmonary fibrosis ([place]); Pulmonary nodules/lesions, multiple; Cough; Mass of upper lobe of right lung; Acute on chronic respiratory failure with hypoxia ([place]); History of recurrent UTI (urinary tract infection); DNR (do not resuscitate); Hypoxia; Shortness of breath; CAD (coronary artery disease); and Bilateral hydronephrosis on their problem list.  Significant Hospital Events: Including procedures, antibiotic start and stop dates in addition to other pertinent events   Echocardiogram 3/13 >> LVEF 55-60%, indeterminate diastolic parameters but suggests RV overload.  Moderately enlarged RV and mildly reduced RV function.  Estimated RVSP 30.5  Interim History / Subjective:     Objective:  Blood pressure 102/67, pulse 75, temperature 97.8 F (36.6 C), temperature source Oral, resp. rate 20, height 6\' 2"  (1.88 m), weight 97.8 kg, SpO2 (!) 85 %.        Intake/Output Summary (Last 24 hours) at 12/11/2022 1340 Last data filed at 12/11/2022 1000 Gross per  24 hour  Intake 0 ml  Output 700 ml  Net -700 ml    Filed Weights   12/07/22 2137 12/09/22 0500 12/11/22 0452  Weight: 99.8 kg 95.1 kg 97.8 kg    Examination: General: Awake alert no acute distress HEENT: MM pink/moist no JVD is appreciated Neuro: Grossly intact without focal defect CV: Atrial fibrillation   PULM: Decreased breath sounds 3 L nasal cannula with O2 saturations of 93%  GI: soft, bsx4 active  GU: Extremities: warm/dry, voids negative edema, left foot with old metatarsal amputation Skin: no rashes or lesions  Recent Labs  Lab 12/09/22 0133 12/10/22 0254 12/11/22 0104  NA 130* 125* 126*  K 4.4 4.1 4.1  CL 96* 95* 95*  CO2 22 18* 20*  BUN 30* 47* 69*  CREATININE 2.57* 3.37* 3.45*  GLUCOSE 171* 183* 211*   Recent Labs  Lab 12/09/22 0133 12/10/22 0254 12/11/22 0104  HGB 16.0 15.1 14.7  HCT 47.9 46.2 44.2  WBC 11.4* 10.8* 10.6*  PLT 375 378 365   No chest x-ray Assessment & Plan:   Acute on probable chronic hypoxic respiratory failure - presumed exacerbated by AFRVR, unclear why he has had recurrence after DCCV. Low suspicion for DVT/PE given that he is chronically anticoagulated but still at some risk especially given his RUL nodule. Ambulatory evaluation without oxygen to ascertain if he needs full-time oxygen. Lower extremity Doppler studies were negative   -Suspect that he will need supplemental oxygen and at least with exertion and at night.  He was not using O2 reliably with exertion but he does have portable tanks, so he was probably under using. -Ambulatory oximetry prior to discharge to determine appropriate flow rate with exertion -Lower extremity duplex pending although suspicion for pulmonary embolism is low given his anticoagulation  COPD/emphysema - no hx to support exacerbation. Continue bronchodilators Follow-up with pulmonary as an outpatient  Enlarging RUL nodule. Dr. Lamonte Sakai to see him as an outpatient on 12/17/2022  PAF - s/p DCCV 12/02/22. Cardiology    Best practice  (evaluated daily):  Per primary team.  Signature:   Marshell Garfinkel MD [facility] See Amion for pager  If no response to pager , please call [phone] until 7pm After 7:00 pm call Elink  [phone] 12/11/2022, 1:41 PM

## 2022-12-11 NOTE — Progress Notes (Signed)
Progress Note  Patient Name: Kevin [name] Date of Encounter: 12/11/2022  Primary Cardiologist: Lauree Chandler, MD   Subjective   He is doing ok. S/p foley  Inpatient Medications    Scheduled Meds:  amoxicillin-clavulanate  1 tablet Oral Q12H   apixaban  5 mg Oral BID   diltiazem  120 mg Oral Daily   doxycycline  100 mg Oral Q12H   insulin aspart  0-15 Units Subcutaneous TID PC & HS   insulin aspart  5 Units Subcutaneous TID WC   insulin glargine-yfgn  15 Units Subcutaneous BID   leptospermum manuka honey  1 Application Topical Daily   mesalamine  1.2 g Oral BID   metoprolol tartrate  50 mg Oral BID   oxybutynin  5 mg Oral QHS   rosuvastatin  5 mg Oral Daily   tamsulosin  0.4 mg Oral q12n4p   trimethoprim  100 mg Oral QHS   umeclidinium-vilanterol  1 puff Inhalation Daily   Continuous Infusions:  PRN Meds: ipratropium-albuterol, lidocaine, melatonin   Vital Signs    Vitals:   12/10/22 2158 12/11/22 0016 12/11/22 0452 12/11/22 0804  BP:  112/71 106/62 102/67  Pulse:  64 64 75  Resp: 18 18 18 20   Temp: 97.7 F (36.5 C) 97.6 F (36.4 C) 98.2 F (36.8 C) 97.8 F (36.6 C)  TempSrc: Oral Oral Oral Oral  SpO2:  94% 95% (!) 85%  Weight:   97.8 kg   Height:        Intake/Output Summary (Last 24 hours) at 12/11/2022 1323 Last data filed at 12/11/2022 1000 Gross per 24 hour  Intake 0 ml  Output 700 ml  Net -700 ml   Filed Weights   12/07/22 2137 12/09/22 0500 12/11/22 0452  Weight: 99.8 kg 95.1 kg 97.8 kg    Telemetry    Atrial fibrillation with rapid ventricular rate- Personally Reviewed  ECG    None today- Personally Reviewed  Physical Exam    General: Comfortable Head: Atraumatic, normal size  Eyes: PEERLA, EOMI  Neck: Supple, normal JVD Cardiac: Normal S1, S2; IRRR; no murmurs, rubs, or gallops Lungs: Clear to auscultation bilaterally Abd: Soft, nontender, no hepatomegaly  Ext: warm, no edema Musculoskeletal: No deformities, BUE  and BLE strength normal and equal Skin: Warm and dry, no rashes   Neuro: Alert and oriented to person, place, time, and situation, CNII-XII grossly intact, no focal deficits  Psych: Normal mood and affect   Labs    Chemistry Recent Labs  Lab 12/09/22 0133 12/10/22 0254 12/11/22 0104  NA 130* 125* 126*  K 4.4 4.1 4.1  CL 96* 95* 95*  CO2 22 18* 20*  GLUCOSE 171* 183* 211*  BUN 30* 47* 69*  CREATININE 2.57* 3.37* 3.45*  CALCIUM 8.8* 8.7* 8.5*  PROT  --   --  7.6  ALBUMIN  --   --  2.7*  AST  --   --  92*  ALT  --   --  65*  ALKPHOS  --   --  132*  BILITOT  --   --  1.0  GFRNONAA 25* 18* 17*  ANIONGAP 12 12 11      Hematology Recent Labs  Lab 12/09/22 0133 12/10/22 0254 12/11/22 0104  WBC 11.4* 10.8* 10.6*  RBC 5.45 5.31 5.16  HGB 16.0 15.1 14.7  HCT 47.9 46.2 44.2  MCV 87.9 87.0 85.7  MCH 29.4 28.4 28.5  MCHC 33.4 32.7 33.3  RDW 15.2 14.7 14.5  PLT 375  378 365    Cardiac EnzymesNo results for input(s): "TROPONINI" in the last 168 hours. No results for input(s): "TROPIPOC" in the last 168 hours.   BNP Recent Labs  Lab 12/07/22 2130  BNP 475.5*     DDimer No results for input(s): "DDIMER" in the last 168 hours.   Radiology    US RENAL  Result Date: 12/11/2022 CLINICAL DATA:  Acute kidney injury. EXAM: RENAL / URINARY TRACT ULTRASOUND COMPLETE COMPARISON:  August 20, 2019 FINDINGS: Right Kidney: Renal measurements: 11.6 cm x 6.8 cm x 5.4 cm = volume: 220.6 mL. Echogenicity within normal limits. No mass is visualized. There is marked severity right-sided hydronephrosis and hydroureter to the level of the urinary bladder. This is increased in severity when compared to the prior study. Left Kidney: Renal measurements: 10.5 cm x 6.4 cm x 6.2 cm = volume: 217.6 mL. Echogenicity within normal limits. No mass is visualized. There is marked severity left-sided hydronephrosis and hydroureter to the level of the urinary bladder. This is increased in severity when  compared to the prior study. Bladder: A moderate amount of heterogeneous echogenic material is seen within the dependent portion of the urinary bladder. No abnormal flow is seen within this region on color Doppler evaluation. The right and left ureteral jets are not clearly identified. Other: The prostate gland is enlarged and measures 7.1 cm x 3.0 cm x 4.8 cm (volume 53.87 mL). IMPRESSION: 1. Marked severity bilateral hydronephrosis and hydroureter to the level of the urinary bladder. 2. Echogenic material within the urinary bladder lumen which may represent blood products. Correlation with urinalysis is recommended. Electronically Signed   By: Virgina Norfolk M.D.   On: 12/11/2022 05:30    Cardiac Studies   TTE 12/08/2022 IMPRESSIONS     1. Left ventricular ejection fraction, by estimation, is 55 to 60%. The  left ventricle has normal function. The left ventricle has no regional  wall motion abnormalities. Left ventricular diastolic parameters are  indeterminate. There is the  interventricular septum is flattened in systole, suggestive of right  ventricular pressure overload.   2. Right ventricular systolic function is mildly reduced. The right  ventricular size is moderately enlarged. There is normal pulmonary artery  systolic pressure. The estimated right ventricular systolic pressure is  [id number] mmHg.   3. Left atrial size was mildly dilated.   4. Right atrial size was moderately dilated.   5. The mitral valve is grossly normal. Trivial mitral valve  regurgitation. No evidence of mitral stenosis.   6. The aortic valve is tricuspid. There is mild calcification of the  aortic valve. There is mild thickening of the aortic valve. Aortic valve  regurgitation is not visualized. No aortic stenosis is present.   7. The inferior vena cava is normal in size with greater than 50%  respiratory variability, suggesting right atrial pressure of 3 mmHg.   FINDINGS   Left Ventricle: Left ventricular  ejection fraction, by estimation, is 55  to 60%. The left ventricle has normal function. The left ventricle has no  regional wall motion abnormalities. The left ventricular internal cavity  size was normal in size. There is   no left ventricular hypertrophy. The interventricular septum is flattened  in systole, consistent with right ventricular pressure overload. Left  ventricular diastolic parameters are indeterminate.   Right Ventricle: The right ventricular size is moderately enlarged. No  increase in right ventricular wall thickness. Right ventricular systolic  function is mildly reduced. There is normal pulmonary artery systolic  pressure. The tricuspid regurgitant  velocity is 2.62 m/s, and with an assumed right atrial pressure of 3 mmHg,  the estimated right ventricular systolic pressure is [id number] mmHg.   Left Atrium: Left atrial size was mildly dilated.   Right Atrium: Right atrial size was moderately dilated.   Pericardium: There is no evidence of pericardial effusion.   Mitral Valve: The mitral valve is grossly normal. Trivial mitral valve  regurgitation. No evidence of mitral valve stenosis. MV peak gradient, 2.8  mmHg. The mean mitral valve gradient is 1.0 mmHg.   Tricuspid Valve: The tricuspid valve is normal in structure. Tricuspid  valve regurgitation is mild . No evidence of tricuspid stenosis.   Aortic Valve: The aortic valve is tricuspid. There is mild calcification  of the aortic valve. There is mild thickening of the aortic valve. Aortic  valve regurgitation is not visualized. No aortic stenosis is present.  Aortic valve mean gradient measures  1.0 mmHg. Aortic valve peak gradient measures 2.7 mmHg. Aortic valve area,  by VTI measures 2.78 cm.   Pulmonic Valve: The pulmonic valve was normal in structure. Pulmonic valve  regurgitation is not visualized. No evidence of pulmonic stenosis.   Aorta: The aortic root is normal in size and structure. Ascending aorta   measurements are within normal limits for age when indexed to body surface  area.   Venous: The inferior vena cava is normal in size with greater than 50%  respiratory variability, suggesting right atrial pressure of 3 mmHg.   IAS/Shunts: No atrial level shunt detected by color flow Doppler.   Patient Profile     82 y.o. male with history of heart failure with preserved ejection fraction, paroxysmal atrial fibrillation, diabetes, CKD stage IIIb, hyperlipidemia, COPD, left transmetatarsal amputation presenting and admitted for heart failure with preserved ejection fraction and acute exacerbation of COPD with acute respiratory failure with hypoxia.   Assessment & Plan    Paroxysmal atrial fibrillation - rates controlled - continue dilt 120 mg daily - no plans for restoration of sinus rhythm with ongoing AKI  Acute on chronic heart failure with preserved ejection fraction-resolved; Group II/III PHTN COPD exacerbation - euvolemic  AKI - crt 1.9->->3.45 -Renal US showed severe bilateral hydroureteronephrosis.  Foley catheter placed  - will defer diuretics to nephrology  Lung cancer CKD stage IIIb  We will follow peripherally      For questions or updates, please contact [place] Please consult [email or web address] for contact info under Cardiology/STEMI.      Signed, Janina Mayo, MD  12/11/2022, 1:23 PM

## 2022-12-11 NOTE — Consult Note (Signed)
H&P Physician requesting consult: Wendee Beavers  Chief Complaint: Acute renal insufficiency, bilateral hydronephrosis  History of Present Illness:82 year old male with multiple medical comorbidities presented with a 3-day history of rapid heart rate and increased shortness of breath.  He was admitted for acute hypoxic respiratory failure thought to be due to CHF exacerbation.  Cardiology did not think it was that and stopped his diuretics.  He developed some acute renal insufficiency.  Renal ultrasound showed bilateral hydronephrosis.  He is well-known to me.  He underwent a TURP but unfortunately kept high residuals and therefore was instructed to perform CIC twice a day.  He has been only perform this once a day at home, and he has not catheterized since last Monday.  He denies any pelvic pain or discomfort.   Past Medical History:  Diagnosis Date   A-fib ([place]) 03/20/2018   Acute blood loss anemia    Benign prostatic hyperplasia with urinary retention    Cerebrovascular accident (CVA) ([place])    Diabetes mellitus type 2 in nonobese [facility])    Essential hypertension 03/20/2018   Former tobacco use    H/O ulcerative colitis 1993   Leukocytosis    Osteomyelitis ([place])    a. 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   PAF (paroxysmal atrial fibrillation) ([place])    a. dx 03/2018 in setting of stroke, osteomyelitis.   Stage 3 chronic kidney disease ([place])    pt/family unaware   Status post transmetatarsal amputation of left foot [facility])    Past Surgical History:  Procedure Laterality Date   AMPUTATION Left 03/24/2018   Procedure: Transmetatarsal Amputation Left Foot;  Surgeon: [name], MD;  Location: [place];  Service: Orthopedics;  Laterality: Left;   CARDIOVERSION N/A 05/05/2018   Procedure: CARDIOVERSION;  Surgeon: [name], MD;  Location: [place];  Service: Cardiovascular;  Laterality: N/A;   CARDIOVERSION N/A 12/02/2022   Procedure: CARDIOVERSION;  Surgeon: Buford Dresser, MD;   Location[facility];  Service: Cardiovascular;  Laterality: N/A;   COLONOSCOPY  12/2017   benign, remission from ulcerative colitis in 90s   I & D EXTREMITY Left 03/17/2018   Procedure: [place];  Surgeon: [name], MD;  Location: [place];  Service: Orthopedics;  Laterality: Left;   STUMP REVISION Left 05/10/2018   Procedure: REVISION LEFT TRANSMETATARSAL AMPUTATION;  Surgeon: [name], MD;  Location: [place];  Service: Orthopedics;  Laterality: Left;    Home Medications:  Medications Prior to Admission  Medication Sig Dispense Refill Last Dose   albuterol (VENTOLIN HFA) 108 (90 Base) MCG/ACT inhaler Inhale 2 puffs into the lungs every 6 (six) hours as needed for wheezing or shortness of breath. 24 g 1 Past Month   apixaban (ELIQUIS) 5 MG TABS tablet Take 1 tablet (5 mg total) by mouth 2 (two) times daily. 180 tablet 1 12/07/2022 at 0800   diltiazem (CARDIZEM CD) 120 MG 24 hr capsule Take 1 capsule (120 mg total) by mouth daily. 90 capsule 0 12/07/2022   empagliflozin (JARDIANCE) 10 MG TABS tablet Take 10 mg by mouth daily.   12/07/2022   escitalopram (LEXAPRO) 10 MG tablet Take 10 mg by mouth daily.   12/07/2022   furosemide (LASIX) 20 MG tablet TAKE 1 TABLET DAILY. YOU   MAY TAKE AN EXTRA 20MG      TABLET FOR INCREASED       SWELLING OR WEIGHT GAIN (Patient taking differently: Take 20 mg by mouth See admin instructions. 20 mg daily. May take an extra  20 mg for increased swelling or weight gain.) 135 tablet 1 12/07/2022   loratadine (CLARITIN) 10 MG tablet Take 10 mg by mouth at bedtime.   Past Week   mesalamine (LIALDA) 1.2 g EC tablet Take 1.2 g by mouth daily.   12/07/2022   Multiple Vitamin (MULTIVITAMIN WITH MINERALS) TABS tablet Take 1 tablet by mouth daily.   12/07/2022   Multiple Vitamins-Minerals (PRESERVISION AREDS 2 PO) Take 1 capsule by mouth 2 (two) times daily.   12/07/2022   oxybutynin (DITROPAN) 5 MG tablet Take 5 mg by mouth at bedtime.   Past  Week   rosuvastatin (CRESTOR) 10 MG tablet Take 0.5 tablets (5 mg total) by mouth daily. 45 tablet 2 Past Week   tamsulosin (FLOMAX) 0.4 MG CAPS capsule Take 1 capsule (0.4 mg total) by mouth 2 times daily at 12 noon and 4 pm. (Patient taking differently: Take 0.4 mg by mouth in the morning and at bedtime.) 60 capsule 0 12/07/2022   Tiotropium Bromide-Olodaterol (STIOLTO RESPIMAT) 2.5-2.5 MCG/ACT AERS Inhale 2 puffs into the lungs daily. 4 g 11 12/07/2022   trimethoprim (TRIMPEX) 100 MG tablet Take 100 mg by mouth at bedtime.   11 Past Week   metoprolol succinate (TOPROL XL) 50 MG 24 hr tablet Hold this medication until you have follow up with your cardiology provider. (Patient not taking: Reported on 12/08/2022) 90 tablet 1 Not Taking at 1130   Allergies: No Known Allergies  Family History  Problem Relation Age of Onset   Hypertension Father    Social History:  reports that he quit smoking about 39 years ago. His smoking use included cigarettes. He started smoking about 61 years ago. He has a 20.00 pack-year smoking history. He has never used smokeless tobacco. He reports current alcohol use of about 4.0 standard drinks of alcohol per week. He reports that he does not use drugs.  ROS: A complete review of systems was performed.  All systems are negative except for pertinent findings as noted. ROS   Physical Exam:  Vital signs in last 24 hours: Temp:  [97.6 F (36.4 C)-98.7 F (37.1 C)] 97.8 F (36.6 C) (03/16 0804) Pulse Rate:  [64-75] 75 (03/16 0804) Resp:  [18-20] 20 (03/16 0804) BP: (102-112)/(62-71) 102/67 (03/16 0804) SpO2:  [85 %-95 %] 85 % (03/16 0804) Weight:  [97.8 kg] 97.8 kg (03/16 0452) General:  Alert and oriented, No acute distress HEENT: Normocephalic, atraumatic Neck: No JVD or lymphadenopathy Cardiovascular: Regular rate and rhythm Lungs: Regular rate and effort Abdomen: Soft, nontender, nondistended, no abdominal masses Genitourinary: Circumcised phallus,  bilateral testicles descended without masses Back: No CVA tenderness Extremities: No edema Neurologic: Grossly intact  Laboratory Data:  Results for orders placed or performed during the hospital encounter of 12/07/22 (from the past 24 hour(s))  Glucose, capillary     Status: Abnormal   Collection Time: 12/10/22 11:20 AM  Result Value Ref Range   Glucose-Capillary 274 (H) 70 - 99 mg/dL  Sodium, urine, random     Status: None   Collection Time: 12/10/22 11:51 AM  Result Value Ref Range   Sodium, Ur 80 mmol/L  Osmolality, urine     Status: None   Collection Time: 12/10/22 11:51 AM  Result Value Ref Range   Osmolality, Ur 325 300 - 900 mOsm/kg  Glucose, capillary     Status: Abnormal   Collection Time: 12/10/22  4:19 PM  Result Value Ref Range   Glucose-Capillary 323 (H) 70 - 99 mg/dL  Osmolality  Status: Abnormal   Collection Time: 12/10/22  5:28 PM  Result Value Ref Range   Osmolality 308 (H) 275 - 295 mOsm/kg  Creatinine, urine, random     Status: None   Collection Time: 12/10/22  6:10 PM  Result Value Ref Range   Creatinine, Urine 74 mg/dL  Urinalysis, w/ Reflex to Culture (Infection Suspected) -Urine, Clean Catch     Status: Abnormal   Collection Time: 12/10/22  6:10 PM  Result Value Ref Range   Specimen Source URINE, CLEAN CATCH    Color, Urine YELLOW YELLOW   APPearance TURBID (A) CLEAR   Specific Gravity, Urine 1.012 1.005 - 1.030   pH 5.0 5.0 - 8.0   Glucose, UA 150 (A) NEGATIVE mg/dL   Hgb urine dipstick SMALL (A) NEGATIVE   Bilirubin Urine NEGATIVE NEGATIVE   Ketones, ur NEGATIVE NEGATIVE mg/dL   Protein, ur 100 (A) NEGATIVE mg/dL   Nitrite NEGATIVE NEGATIVE   Leukocytes,Ua MODERATE (A) NEGATIVE   RBC / HPF 0-5 0 - 5 RBC/hpf   WBC, UA >50 0 - 5 WBC/hpf   Bacteria, UA MANY (A) NONE SEEN   Squamous Epithelial / HPF 0-5 0 - 5 /HPF   WBC Clumps PRESENT    Hyaline Casts, UA PRESENT   Urine Culture     Status: None (Preliminary result)   Collection Time:  12/10/22  6:10 PM   Specimen: Urine, Random  Result Value Ref Range   Specimen Description URINE, RANDOM    Special Requests      URINE, CLEAN CATCH Performed at [facility], [address], [place], [place] [address]    Culture PENDING    Report Status PENDING   Glucose, capillary     Status: Abnormal   Collection Time: 12/10/22  9:55 PM  Result Value Ref Range   Glucose-Capillary 263 (H) 70 - 99 mg/dL  CBC with Differential/Platelet     Status: Abnormal   Collection Time: 12/11/22  1:04 AM  Result Value Ref Range   WBC 10.6 (H) 4.0 - 10.5 K/uL   RBC 5.16 4.22 - 5.81 MIL/uL   Hemoglobin 14.7 13.0 - 17.0 g/dL   HCT 44.2 39.0 - 52.0 %   MCV 85.7 80.0 - 100.0 fL   MCH 28.5 26.0 - 34.0 pg   MCHC 33.3 30.0 - 36.0 g/dL   RDW 14.5 11.5 - 15.5 %   Platelets 365 150 - 400 K/uL   nRBC 0.0 0.0 - 0.2 %   Neutrophils Relative % 61 %   Neutro Abs 6.5 1.7 - 7.7 K/uL   Lymphocytes Relative 24 %   Lymphs Abs 2.6 0.7 - 4.0 K/uL   Monocytes Relative 11 %   Monocytes Absolute 1.2 (H) 0.1 - 1.0 K/uL   Eosinophils Relative 3 %   Eosinophils Absolute 0.3 0.0 - 0.5 K/uL   Basophils Relative 1 %   Basophils Absolute 0.1 0.0 - 0.1 K/uL   Immature Granulocytes 0 %   Abs Immature Granulocytes 0.04 0.00 - 0.07 K/uL  Comprehensive metabolic panel     Status: Abnormal   Collection Time: 12/11/22  1:04 AM  Result Value Ref Range   Sodium 126 (L) 135 - 145 mmol/L   Potassium 4.1 3.5 - 5.1 mmol/L   Chloride 95 (L) 98 - 111 mmol/L   CO2 20 (L) 22 - 32 mmol/L   Glucose, Bld 211 (H) 70 - 99 mg/dL   BUN 69 (H) 8 - 23 mg/dL   Creatinine,  Ser 3.45 (H) 0.61 - 1.24 mg/dL   Calcium 8.5 (L) 8.9 - 10.3 mg/dL   Total Protein 7.6 6.5 - 8.1 g/dL   Albumin 2.7 (L) 3.5 - 5.0 g/dL   AST 92 (H) 15 - 41 U/L   ALT 65 (H) 0 - 44 U/L   Alkaline Phosphatase 132 (H) 38 - 126 U/L   Total Bilirubin 1.0 0.3 - 1.2 mg/dL   GFR, Estimated 17 (L) >60 mL/min   Anion gap 11 5 - 15  Glucose, capillary     Status:  Abnormal   Collection Time: 12/11/22  8:02 AM  Result Value Ref Range   Glucose-Capillary 261 (H) 70 - 99 mg/dL   Recent Results (from the past 240 hour(s))  Resp panel by RT-PCR (RSV, Flu A&B, Covid) Anterior Nasal Swab     Status: None   Collection Time: 12/08/22  1:08 AM   Specimen: Anterior Nasal Swab  Result Value Ref Range Status   SARS Coronavirus 2 by RT PCR NEGATIVE NEGATIVE Final   Influenza A by PCR NEGATIVE NEGATIVE Final   Influenza B by PCR NEGATIVE NEGATIVE Final    Comment: (NOTE) The Xpert Xpress SARS-CoV-2/FLU/RSV plus assay is intended as an aid in the diagnosis of influenza from Nasopharyngeal swab specimens and should not be used as a sole basis for treatment. Nasal washings and aspirates are unacceptable for Xpert Xpress SARS-CoV-2/FLU/RSV testing.  Fact Sheet for Patients: EntrepreneurPulse.com.au  Fact Sheet for Healthcare Providers: IncredibleEmployment.be  This test is not yet approved or cleared by the [place] FDA and has been authorized for detection and/or diagnosis of SARS-CoV-2 by FDA under an Emergency Use Authorization (EUA). This EUA will remain in effect (meaning this test can be used) for the duration of the COVID-19 declaration under Section 564(b)(1) of the Act, 21 U.S.C. section 360bbb-3(b)(1), unless the authorization is terminated or revoked.     Resp Syncytial Virus by PCR NEGATIVE NEGATIVE Final    Comment: (NOTE) Fact Sheet for Patients: EntrepreneurPulse.com.au  Fact Sheet for Healthcare Providers: IncredibleEmployment.be  This test is not yet approved or cleared by the [place] FDA and has been authorized for detection and/or diagnosis of SARS-CoV-2 by FDA under an Emergency Use Authorization (EUA). This EUA will remain in effect (meaning this test can be used) for the duration of the COVID-19 declaration under Section 564(b)(1) of the Act, 21  U.S.C. section 360bbb-3(b)(1), unless the authorization is terminated or revoked.  Performed at [facility], [place] [address] [place], [place] [address]   Urine Culture     Status: None (Preliminary result)   Collection Time: 12/10/22  6:10 PM   Specimen: Urine, Random  Result Value Ref Range Status   Specimen Description URINE, RANDOM  Final   Special Requests   Final    URINE, CLEAN CATCH Performed at [facility], [place] [address], [place], [place] [address]    Culture PENDING  Incomplete   Report Status PENDING  Incomplete   Creatinine: Recent Labs    12/07/22 2130 12/08/22 0156 12/09/22 0133 12/10/22 0254 12/11/22 0104  CREATININE 1.94* 2.07* 2.57* 3.37* 3.45*   Renal ultrasound personally reviewed and is detailed in the history of present illness  Procedure: Under sterile conditions, I placed a 16 French Foley catheter.  There was return of some cloudy urine.  I secured the catheter to his right leg.  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  Once he has improved clinically, okay to remove catheter with instructions to perform clean intermittent catheterization at least 3 times a day.  Follow-up with my office outpatient.  Marton Redwood, III 12/11/2022, 11:13 AM

## 2022-12-12 DIAGNOSIS — J9621 Acute and chronic respiratory failure with hypoxia: Secondary | ICD-10-CM | POA: Diagnosis not present

## 2022-12-12 DIAGNOSIS — I5033 Acute on chronic diastolic (congestive) heart failure: Secondary | ICD-10-CM | POA: Diagnosis not present

## 2022-12-12 DIAGNOSIS — N179 Acute kidney failure, unspecified: Secondary | ICD-10-CM | POA: Diagnosis not present

## 2022-12-12 DIAGNOSIS — I10 Essential (primary) hypertension: Secondary | ICD-10-CM | POA: Diagnosis not present

## 2022-12-12 LAB — COMPREHENSIVE METABOLIC PANEL
ALT: 72 U/L — ABNORMAL HIGH (ref 0–44)
AST: 68 U/L — ABNORMAL HIGH (ref 15–41)
Albumin: 2.7 g/dL — ABNORMAL LOW (ref 3.5–5.0)
Alkaline Phosphatase: 134 U/L — ABNORMAL HIGH (ref 38–126)
Anion gap: 9 (ref 5–15)
BUN: 60 mg/dL — ABNORMAL HIGH (ref 8–23)
CO2: 18 mmol/L — ABNORMAL LOW (ref 22–32)
Calcium: 8.7 mg/dL — ABNORMAL LOW (ref 8.9–10.3)
Chloride: 103 mmol/L (ref 98–111)
Creatinine, Ser: 2.39 mg/dL — ABNORMAL HIGH (ref 0.61–1.24)
GFR, Estimated: 27 mL/min — ABNORMAL LOW (ref >60)
Glucose, Bld: 178 mg/dL — ABNORMAL HIGH (ref 70–99)
Potassium: 3.8 mmol/L (ref 3.5–5.1)
Sodium: 130 mmol/L — ABNORMAL LOW (ref 135–145)
Total Bilirubin: 0.5 mg/dL (ref 0.3–1.2)
Total Protein: 7.3 g/dL (ref 6.5–8.1)

## 2022-12-12 LAB — PHOSPHORUS: Phosphorus: 4.7 mg/dL — ABNORMAL HIGH (ref 2.5–4.6)

## 2022-12-12 LAB — LIPID PANEL
Cholesterol: 94 mg/dL (ref 0–200)
HDL: 28 mg/dL — ABNORMAL LOW (ref >40)
LDL Cholesterol: 43 mg/dL (ref 0–99)
Total CHOL/HDL Ratio: 3.4 RATIO
Triglycerides: 113 mg/dL (ref <150)
VLDL: 23 mg/dL (ref 0–40)

## 2022-12-12 LAB — GLUCOSE, CAPILLARY
Glucose-Capillary: 216 mg/dL — ABNORMAL HIGH (ref 70–99)
Glucose-Capillary: 367 mg/dL — ABNORMAL HIGH (ref 70–99)
Glucose-Capillary: 135 mg/dL — ABNORMAL HIGH (ref 70–99)
Glucose-Capillary: 158 mg/dL — ABNORMAL HIGH (ref 70–99)

## 2022-12-12 LAB — CBC
HCT: 45 % (ref 39.0–52.0)
Hemoglobin: 14.9 g/dL (ref 13.0–17.0)
MCH: 28.4 pg (ref 26.0–34.0)
MCHC: 33.1 g/dL (ref 30.0–36.0)
MCV: 85.7 fL (ref 80.0–100.0)
Platelets: 333 10*3/uL (ref 150–400)
RBC: 5.25 MIL/uL (ref 4.22–5.81)
RDW: 14.6 % (ref 11.5–15.5)
WBC: 10.4 10*3/uL (ref 4.0–10.5)
nRBC: 0 % (ref 0.0–0.2)

## 2022-12-12 LAB — URINE CULTURE: Culture: NO GROWTH

## 2022-12-12 LAB — C-REACTIVE PROTEIN: CRP: 3.7 mg/dL — ABNORMAL HIGH (ref <1.0)

## 2022-12-12 LAB — MAGNESIUM: Magnesium: 2.4 mg/dL (ref 1.7–2.4)

## 2022-12-12 LAB — SEDIMENTATION RATE: Sed Rate: 28 mm/hr — ABNORMAL HIGH (ref 0–16)

## 2022-12-12 MED ORDER — INSULIN GLARGINE-YFGN 100 UNIT/ML ~~LOC~~ SOLN
10.0000 [IU] | Freq: Once | SUBCUTANEOUS | Status: AC
  Administered 2022-12-12: 10 [IU] via SUBCUTANEOUS
  Filled 2022-12-12: qty 0.1

## 2022-12-12 MED ORDER — FLUCONAZOLE 200 MG PO TABS
200.0000 mg | ORAL_TABLET | Freq: Every day | ORAL | Status: DC
  Administered 2022-12-12 – 2022-12-13 (×2): 200 mg via ORAL
  Filled 2022-12-12 (×2): qty 1

## 2022-12-12 MED ORDER — INSULIN ASPART 100 UNIT/ML IJ SOLN
7.0000 [IU] | Freq: Three times a day (TID) | INTRAMUSCULAR | Status: DC
  Administered 2022-12-12 – 2022-12-13 (×2): 7 [IU] via SUBCUTANEOUS

## 2022-12-12 MED ORDER — CHLORHEXIDINE GLUCONATE CLOTH 2 % EX PADS
6.0000 | MEDICATED_PAD | Freq: Every day | CUTANEOUS | Status: DC
  Administered 2022-12-12 – 2022-12-13 (×2): 6 via TOPICAL

## 2022-12-12 MED ORDER — INSULIN GLARGINE-YFGN 100 UNIT/ML ~~LOC~~ SOLN
25.0000 [IU] | Freq: Two times a day (BID) | SUBCUTANEOUS | Status: DC
  Administered 2022-12-12 – 2022-12-13 (×2): 25 [IU] via SUBCUTANEOUS
  Filled 2022-12-12 (×3): qty 0.25

## 2022-12-12 NOTE — Evaluation (Signed)
Physical Therapy Evaluation Patient Details Name: Kevin [name] MRN: [id number] DOB: 05/19/1941 Today's Date: 12/12/2022  History of Present Illness  Pt is a 82 y/o M presenting to ED on 3/12 with SOB and hypoxemia, admitted for acute on chronic respiratory failure wtih hypoxia. PMH includes COPD, chronic hypoxic respiratory failure on 3L PRN, diastolic CHF, CKD IIIB, recent RUL mass, A fib s/p DCCV on 3/7 on Eliquis, DM2 with L foot ulcer, CVA, PUD s/p L TMA and BPH   Clinical Impression  Kevin [name] is 82 y.o. male admitted with above HPI and diagnosis. Patient is currently limited by functional impairments below (see PT problem list). Patient lives with his wife and is independent with occasional use of SPC at home at baseline. Pt is very unsteady with transfers and gait and has poor safety awareness with mobility. He required min assist to steady during sit<>stands and Min assist for balance with gait to prevent LOB 3x during gait. Pt took seated rest break in computer chair and required max cues for safety awareness as therapist needed to prevent chair from rolling out from under pt. Patient will benefit from continued skilled PT interventions to address impairments and progress independence with mobility, recommending HHPT and discussed extensively use of rollator for safety with gait due to high fall risk and SOB with activity. Acute PT will follow and progress as able.        Recommendations for follow up therapy are one component of a multi-disciplinary discharge planning process, led by the attending physician.  Recommendations may be updated based on patient status, additional functional criteria and insurance authorization.  Follow Up Recommendations Home health PT      Assistance Recommended at Discharge Frequent or constant Supervision/Assistance  Patient can return home with the following  A little help with walking and/or transfers;A little help with  bathing/dressing/bathroom;Assistance with cooking/housework;Assist for transportation;Help with stairs or ramp for entrance;Direct supervision/assist for medications management    Equipment Recommendations None recommended by PT  Recommendations for Other Services       Functional Status Assessment Patient has had a recent decline in their functional status and demonstrates the ability to make significant improvements in function in a reasonable and predictable amount of time.     Precautions / Restrictions Precautions Precautions: Fall Restrictions Weight Bearing Restrictions: No      Mobility  Bed Mobility Overal bed mobility: Needs Assistance Bed Mobility: Sit to Supine, Supine to Sit     Supine to sit: Min guard Sit to supine: Supervision   General bed mobility comments: pt using bed rails and momentum to sit up EOB, guarding for safety. supervision to return to supine at EOS.    Transfers Overall transfer level: Needs assistance Equipment used: 1 person hand held assist Transfers: Sit to/from Stand Sit to Stand: Min assist           General transfer comment: pt able to power up from EOB with bil UE use. Min assist needed on rise to prevent LOB due to significant sway on standing.    Ambulation/Gait Ambulation/Gait assistance: Min assist Gait Distance (Feet): 100 Feet Assistive device: None Gait Pattern/deviations: Step-through pattern, Decreased step length - right, Decreased step length - left, Decreased stride length, Trunk flexed, Drifts right/left, Staggering right, Staggering left, Narrow base of support Gait velocity: decr     General Gait Details: pt declining use of RW or IV pole at start. pt drifting Rt/Lt throughout gait, multiple LOB's Rt and Lt, min  assist needed to correct/steady balance. Educated pt on need for support with AD, recommend rollator, pt has one at home.  Stairs            Wheelchair Mobility    Modified Rankin (Stroke  Patients Only)       Balance Overall balance assessment: Needs assistance Sitting-balance support: Feet supported Sitting balance-Leahy Scale: Fair     Standing balance support: During functional activity Standing balance-Leahy Scale: Poor Standing balance comment: reliant on external support                             Pertinent Vitals/Pain Pain Assessment Pain Assessment: No/denies pain    Home Living Family/patient expects to be discharged to:: Private residence Living Arrangements: Spouse/significant other Available Help at Discharge: Family;Available 24 hours/day Type of Home: House Home Access: Stairs to enter Entrance Stairs-Rails: Psychiatric nurse of Steps: 4   Home Layout: One level Home Equipment: Grab bars - tub/shower;Shower Land (2 wheels);Wheelchair - manual Additional Comments: 3L O2 as needed at home    Prior Function Prior Level of Function : Independent/Modified Independent             Mobility Comments: ind ADLs Comments: ind     Hand Dominance   Dominant Hand: Right    Extremity/Trunk Assessment   Upper Extremity Assessment Upper Extremity Assessment: Defer to OT evaluation;Overall [employer] for tasks assessed    Lower Extremity Assessment Lower Extremity Assessment: Generalized weakness    Cervical / Trunk Assessment Cervical / Trunk Assessment: Normal  Communication   Communication: No difficulties  Cognition Arousal/Alertness: Awake/alert Behavior During Therapy: WFL for tasks assessed/performed Overall Cognitive Status: Impaired/Different from baseline Area of Impairment: Safety/judgement, Following commands, Awareness                       Following Commands: Follows one step commands consistently Safety/Judgement: Decreased awareness of safety, Decreased awareness of deficits Awareness: Emergent   General Comments: poor awareness of deficits/safety        General  Comments General comments (skin integrity, edema, etc.): SpO2 down ot 91% on 4L with mobility    Exercises     Assessment/Plan    PT Assessment Patient needs continued PT services  PT Problem List Decreased strength;Decreased range of motion;Decreased activity tolerance;Decreased balance;Decreased mobility;Decreased knowledge of use of DME;Decreased safety awareness;Cardiopulmonary status limiting activity;Pain;Decreased skin integrity;Impaired sensation       PT Treatment Interventions DME instruction;Patient/family education;Neuromuscular re-education;Balance training;Therapeutic exercise;Therapeutic activities;Functional mobility training;Stair training;Gait training;Cognitive remediation    PT Goals (Current goals can be found in the Care Plan section)  Acute Rehab PT Goals Patient Stated Goal: get home and back to normal routine PT Goal Formulation: With patient/family Time For Goal Achievement: 12/26/22 Potential to Achieve Goals: Good    Frequency Min 3X/week     Co-evaluation               AM-PAC PT "6 Clicks" Mobility  Outcome Measure Help needed turning from your back to your side while in a flat bed without using bedrails?: A Little Help needed moving from lying on your back to sitting on the side of a flat bed without using bedrails?: A Little Help needed moving to and from a bed to a chair (including a wheelchair)?: A Little Help needed standing up from a chair using your arms (e.g., wheelchair or bedside chair)?: A Little Help needed to walk in hospital  room?: A Little Help needed climbing 3-5 steps with a railing? : A Lot 6 Click Score: 17    End of Session Equipment Utilized During Treatment: Gait belt;Oxygen Activity Tolerance: Patient tolerated treatment well Patient left: in bed;with call bell/phone within reach;with bed alarm set;with family/visitor present Nurse Communication: Mobility status PT Visit Diagnosis: Unsteadiness on feet (R26.81);Other  abnormalities of gait and mobility (R26.89);Muscle weakness (generalized) (M62.81);Difficulty in walking, not elsewhere classified (R26.2)    Time: [id number] PT Time Calculation (min) (ACUTE ONLY): 26 min   Charges:   PT Evaluation $PT Eval Moderate Complexity: 1 Mod PT Treatments $Gait Training: 8-22 mins        Verner Mould, DPT Acute Rehabilitation Services Office [phone]  12/12/22 2:07 PM

## 2022-12-12 NOTE — Progress Notes (Addendum)
Admit: 12/07/2022 LOS: 4  16M AKI from obstructive process  Subjective:  No acute events UOP 3.6L, cr down to 2.39 No complaints. Discussed with wife at the bedside. Currently doing PT  03/16 0701 - 03/17 0700 In: 100 [IV Piggyback:100] Out: [id number] [Urine:3635]  Filed Weights   12/09/22 0500 12/11/22 0452 12/12/22 0434  Weight: 95.1 kg 97.8 kg 93.3 kg    Scheduled Meds:  apixaban  5 mg Oral BID   Chlorhexidine Gluconate Cloth  6 each Topical Daily   diltiazem  120 mg Oral Daily   doxycycline  100 mg Oral Q12H   fluconazole  200 mg Oral Daily   insulin aspart  0-15 Units Subcutaneous TID PC & HS   insulin aspart  7 Units Subcutaneous TID WC   insulin glargine-yfgn  10 Units Subcutaneous Once   insulin glargine-yfgn  25 Units Subcutaneous BID   leptospermum manuka honey  1 Application Topical Daily   mesalamine  1.2 g Oral BID   metoprolol tartrate  50 mg Oral BID   oxybutynin  5 mg Oral QHS   rosuvastatin  5 mg Oral Daily   tamsulosin  0.4 mg Oral q12n4p   trimethoprim  100 mg Oral QHS   umeclidinium-vilanterol  1 puff Inhalation Daily   Continuous Infusions:  cefTRIAXone (ROCEPHIN)  IV Stopped (12/11/22 1549)   PRN Meds:.ipratropium-albuterol, lidocaine, melatonin, polyethylene glycol, senna-docusate  Current Labs: reviewed  3/15 UCx Pending  Physical Exam:  Blood pressure 106/71, pulse 71, temperature 97.9 F (36.6 C), temperature source Oral, resp. rate 20, height 6\' 2"  (1.88 m), weight 93.3 kg, SpO2 95 %. Elderly, chronically ill-appearing Foley catheter draining cloudy/purulent debris Regular, normal S1 and S2 Clear bilaterally Nonfocal  A AKI on CKD 3 secondary to obstructive uropathy now with Foley catheter.  Likely UTI. Cr improved Obstructive uropathy status post Foley catheter 3/16 with urology History of TURP with high residuals performing CIC at home periodically Hyponatremia related to impaired free water excretion, not hypoosmolar--improved Left  foot ulcer with underlying OM on outpatient Augmentin and doxycycline History of CAD DM2 would not resume SGLT2 inhibitor Pulmonary nodule/mass outpatient follow-up Hypertension, blood pressure stable  P Now that foley is functioning properly, his Cr is down to 2.39. continue to monitor strict I/O and daily labs. Would recommend holding on Jardiance in the future, likely not the right agent for him given obstructive uropathy/TURP requiring CICs Medication Issues; Preferred narcotic agents for pain control are hydromorphone, fentanyl, and methadone. Morphine should not be used.  Baclofen should be avoided Avoid oral sodium phosphate and magnesium citrate based laxatives / bowel preps   Nothing else to add from a nephrology perspective. Will sign off. Please call with any questions/concerns.  Gean Quint, MD [facility] 12/12/2022, 1:56 PM  Recent Labs  Lab 12/10/22 0254 12/11/22 0104 12/12/22 0223  NA 125* 126* 130*  K 4.1 4.1 3.8  CL 95* 95* 103  CO2 18* 20* 18*  GLUCOSE 183* 211* 178*  BUN 47* 69* 60*  CREATININE 3.37* 3.45* 2.39*  CALCIUM 8.7* 8.5* 8.7*  PHOS  --   --  4.7*   Recent Labs  Lab 12/09/22 0133 12/10/22 0254 12/11/22 0104 12/12/22 0223  WBC 11.4* 10.8* 10.6* 10.4  NEUTROABS 7.4 5.8 6.5  --   HGB 16.0 15.1 14.7 14.9  HCT 47.9 46.2 44.2 45.0  MCV 87.9 87.0 85.7 85.7  PLT 375 378 365 333

## 2022-12-12 NOTE — Progress Notes (Signed)
PROGRESS NOTE  Kevin [name] [id number] DOB: 1941/08/19   PCP: [name], MD  Patient is from: Home.  Lives with his wife.  DOA: 12/07/2022 LOS: 4  Chief complaints Chief Complaint  Patient presents with   Tachycardia     Brief Narrative / Interim history: 82 year old M with PMH of COPD/chronic hypoxic RF on 3 L as needed, diastolic CHF, [id number], recent RUL mass, A-fib s/p DCCV on 3/7 on Eliquis, DM-2 with left foot ulcer, CVA, PUD s/p left TMA and BPH presenting with shortness of breath and hypoxemia to 81 on room air which has improved to 90% after returning home oxygen.  He was admitted for acute on chronic respiratory failure with hypoxia in the setting of COPD and CHF exacerbation and A-fib.  He was back in A-fib after DCCV.  Initially started on IV diuretics.  Cardiology consulted.    Diuretics discontinued due to AKI.   Nephrology consulted.  Renal US showed moderate to severe bilateral hydronephrosis without obstructing calculus, urothelial thickening, diffuse bladder wall thickening, enlarged prostate and right adrenal adenoma.  Foley catheter placed by urology.  Per urology, cloudy urine output during Foley insertion.  Changed ceftriaxone to Augmentin.  Urine culture NGTD.    Pulmonology consulted for COPD and RUL mass.  There is plan for outpatient biopsy of RUL mass by pulmonology.  Patient has diabetic foot ulcer.  MRI showed osteomyelitis.  Orthopedic surgery, [name] Given consulted and recommended p.o. Augmentin and doxycycline until his follow-up on 3/21 for possible BKA.     Subjective: Seen and examined earlier this morning.  No major events overnight of this morning.  No complaints.  Significant improvement in his creatinine after Foley insertion.  Objective: Vitals:   12/12/22 0434 12/12/22 0836 12/12/22 1000 12/12/22 1004  BP: (!) 114/58 106/75 106/71 106/71  Pulse: 62 72  71  Resp: 18 20    Temp: 97.9 F (36.6 C) 97.9 F (36.6 C)    TempSrc: Oral  Oral    SpO2: 94% 95% 95%   Weight: 93.3 kg     Height:        Examination  GENERAL: No apparent distress.  Nontoxic. HEENT: MMM.  Vision and hearing grossly intact.  NECK: Supple.  No apparent JVD.  RESP:  No IWOB.  Fair aeration bilaterally. CVS:  RRR. Heart sounds normal.  ABD/GI/GU: BS+. Abd soft, NTND.  Indwelling Foley. Clear urine with whitish stuff at the bottom.  MSK/EXT:   No apparent deformity. Moves extremities. No edema.  SKIN: Small ulceration over plantar aspect of left TMA.  See picture below. NEURO: Awake and alert. Oriented appropriately.  No apparent focal neuro deficit. PSYCH: Calm. Normal affect.     Procedures:  None  Microbiology summarized: [id number], influenza and RSV PCR nonreactive Urine culture pending  Assessment and plan: Principal Problem:   Acute on chronic respiratory failure with hypoxia (HCC) Active Problems:   Essential hypertension   Osteomyelitis (HCC)   Cerebrovascular accident (CVA) ([place])   Benign essential HTN   PAF (paroxysmal atrial fibrillation) (HCC)   AKI (acute kidney injury) ([place])   Diabetic foot ulcer (HCC)   Insulin dependent type 2 diabetes mellitus (HCC)   Acute on chronic diastolic CHF (congestive heart failure) (HCC)   Chronic kidney disease, stage 3b (HCC)   COPD (chronic obstructive pulmonary disease) (HCC)   Mass of upper lobe of right lung   History of recurrent UTI (urinary tract infection)   DNR (do not resuscitate)   CAD (  coronary artery disease)   Bilateral hydronephrosis  Acute on chronic respiratory failure with hypoxia: Multifactorial including COPD, CHF and possibly underlying RUL mass.  Low suspicion for PE or pneumonia.  CT chest without contrast with a large RUL nodule, unchanged left-sided pulmonary nodule, right adrenal adenoma and possible esophageal dysmotility/GERD.  BNP elevated to 475.  Initially diuresed with IV Lasix for CHF.  -Supplemental oxygen as needed -Incentive spirometry,  OOB -Bronchodilators -Appreciate pulmonary recs  Acute on chronic diastolic CHF: Initially diuresed with IV Lasix due to concern for CHF exacerbation.  BNP was elevated but no pulmonary edema on chest x-ray.  Diuretics discontinued due to AKI.  TTE with LVEF of 55 to 60%, indeterminate DD and RVSP of 30.5 mmHg.  Excellent urine output.  Net negative. -Continue holding diuretics -Strict intake and output, daily weight, renal functions and electrolytes -Appreciate help by cardiology  Paroxysmal A-fib: S/p DCCV on 3/7.  Back in A-fib but rate controlled. -Continue Cardizem, metoprolol and Eliquis -Optimize electrolytes -Continue telemetry monitoring -Appreciate cardiology recs   AKI on CKD-3B: Initially felt to be due to diuretics.  Renal US with severe bilateral hydronephrosis.  CT renal stone study with moderate to severe bilateral hydro, urothelial and bladder wall thickening but no calculus.  UA concerning for UTI.  Urine culture NGTD.  Patient reports sense of incomplete void and white urine for about 2 weeks.  He states he had urine culture at urology office that showed a yeast about 2 weeks ago.  Excellent urine output.  Creatinine improved. Recent Labs    11/25/22 1213 12/07/22 2130 12/08/22 0156 12/09/22 0133 12/10/22 0254 12/11/22 0104 12/12/22 0223  BUN 32* 23 20 30* 47* 69* 60*  CREATININE 1.96* 1.94* 2.07* 2.57* 3.37* 3.45* 2.39*  -Appreciate urology and nephrology. -S/p Foley catheter on 3/16 -Changed Augmentin to ceftriaxone -Discontinue Jardiance on discharge. -Per urology, okay to remove Foley with instruction to perform clean intermittent cath at least 3 times a day once improved -Will clarify about reported yeast infection by patient with urologist  Left foot ulcer and osteomyelitis: Had left TMA for PVD in the past.  Has Wagner ulcer along the left anterior foot.  Followed by [name] Given.  MRI concerning for osteomyelitis. -[name] Given consulted and recommended p.o.  Augmentin and doxycycline until his follow-up on 3/21 -Will reach out to [name] Given if patient remains in-house -Check CRP and ESR -Optimize glycemic control  Severe bilateral hydronephrosis/BPH with LUTS: Reports sense of incomplete void and white urine for about 2 weeks.  He says he has urine culture at urology office that showed yeast.  He is followed by Dr. Gloriann Loan. -Dr. Gloriann Loan consulted and recommended Foley catheter and CT renal stone study -Follow urine culture -Continue home tamsulosin -Not sure if he needs to be on oxybutynin but defer to urology  History of CAD: Stable.  No chest pain.  TTE without RWMA. -Continue home meds  Chronic COPD: Doubt exacerbation. -Continue bronchodilators and inhalers -Pulmonology on board  Uncontrolled IDDM-2 with CKD-3B, hyperglycemia and diabetic foot ulcer: A1c 10.2%. Recent Labs  Lab 12/11/22 1142 12/11/22 1641 12/11/22 2126 12/12/22 0902 12/12/22 1132  GLUCAP 262* 224* 253* 216* 367*  -Increase Semglee from 15 to 25 units twice daily.  Received 15 units earlier.  Will give additional 10 units now -Continue SSI-moderate -Increase NovoLog from 5 to 7 units 3 times daily -Further adjustment as appropriate. -Continue home Crestor -Discontinue home Jardiance on discharge due to risk for UTI  Pulmonary nodule/mass: Followed  by pulmonology, Dr. Lamonte Sakai.  Outpatient imaging showed RUL spiculated mass as well as left-sided pulmonary nodules.  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  Has upcoming appointment with Dr. Lamonte Sakai -Outpatient follow-up with pulmonology  Elevated liver enzymes: Mild and improved. -Continue monitoring  Essential hypertension: Normotensive for most part. -Continue metoprolol and Cardizem  Hyponatremia: Likely due to renal failure and possible SIADH.  Improved. -Continue monitoring  Physical deconditioning -OOB/PT/OT  History of CVA: Stable.  No focal neurodeficit.   Goal of  care: DNR/DNI.  Body mass index is 26.41 kg/m.           DVT prophylaxis:   apixaban (ELIQUIS) tablet 5 mg  Code Status: DNR/DNI Family Communication: None at bedside Level of care: Telemetry Cardiac Status is: Inpatient Remains inpatient appropriate because: Respiratory failure, AKI, urinary obstruction and hyponatremia   Final disposition: TBD Consultants:  Cardiology, pulmonology, nephrology, urology, Ortho  55 minutes with more than 50% spent in reviewing records, counseling patient/family and coordinating care.   Sch Meds:  Scheduled Meds:  apixaban  5 mg Oral BID   Chlorhexidine Gluconate Cloth  6 each Topical Daily   diltiazem  120 mg Oral Daily   doxycycline  100 mg Oral Q12H   insulin aspart  0-15 Units Subcutaneous TID PC & HS   insulin aspart  5 Units Subcutaneous TID WC   insulin glargine-yfgn  15 Units Subcutaneous BID   leptospermum manuka honey  1 Application Topical Daily   mesalamine  1.2 g Oral BID   metoprolol tartrate  50 mg Oral BID   oxybutynin  5 mg Oral QHS   rosuvastatin  5 mg Oral Daily   tamsulosin  0.4 mg Oral q12n4p   trimethoprim  100 mg Oral QHS   umeclidinium-vilanterol  1 puff Inhalation Daily   Continuous Infusions:  cefTRIAXone (ROCEPHIN)  IV Stopped (12/11/22 1549)   PRN Meds:.ipratropium-albuterol, lidocaine, melatonin, polyethylene glycol, senna-docusate  Antimicrobials: Anti-infectives (From admission, onward)    Start     Dose/Rate Route Frequency Ordered Stop   12/11/22 1545  cefTRIAXone (ROCEPHIN) 2 g in sodium chloride 0.9 % 100 mL IVPB        2 g 200 mL/hr over 30 Minutes Intravenous Every 24 hours 12/11/22 1449 12/16/22 1544   12/09/22 2200  amoxicillin-clavulanate (AUGMENTIN) 500-125 MG per tablet 1 tablet  Status:  Discontinued        1 tablet Oral Every 12 hours 12/09/22 1500 12/11/22 1325   12/09/22 2200  doxycycline (VIBRA-TABS) tablet 100 mg        100 mg Oral Every 12 hours 12/09/22 1500     12/08/22 0200   trimethoprim (TRIMPEX) tablet 100 mg        100 mg Oral Daily at bedtime 12/08/22 0102          I have personally reviewed the following labs and images: CBC: Recent Labs  Lab 12/07/22 2130 12/08/22 0156 12/09/22 0133 12/10/22 0254 12/11/22 0104 12/12/22 0223  WBC 8.8 10.5 11.4* 10.8* 10.6* 10.4  NEUTROABS 5.7  --  7.4 5.8 6.5  --   HGB 15.2 14.2 16.0 15.1 14.7 14.9  HCT 46.3 43.1 47.9 46.2 44.2 45.0  MCV 88.5 87.6 87.9 87.0 85.7 85.7  PLT 332 327 375 378 365 333   BMP &GFR Recent Labs  Lab 12/08/22 0156 12/09/22 0133 12/10/22 0254 12/11/22 0104 12/12/22 0223  NA 132* 130* 125* 126* 130*  K 3.7 4.4 4.1 4.1 3.8  CL 100 96* 95* 95* 103  CO2 20* 22 18* 20* 18*  GLUCOSE 183* 171* 183* 211* 178*  BUN 20 30* 47* 69* 60*  CREATININE 2.07* 2.57* 3.37* 3.45* 2.39*  CALCIUM 8.7* 8.8* 8.7* 8.5* 8.7*  MG  --  2.2  --   --  2.4  PHOS  --   --   --   --  4.7*   Estimated Creatinine Clearance: 29.1 mL/min (A) (by C-G formula based on SCr of 2.39 mg/dL (H)). Liver & Pancreas: Recent Labs  Lab 12/11/22 0104 12/12/22 0223  AST 92* 68*  ALT 65* 72*  ALKPHOS 132* 134*  BILITOT 1.0 0.5  PROT 7.6 7.3  ALBUMIN 2.7* 2.7*   No results for input(s): "LIPASE", "AMYLASE" in the last 168 hours. No results for input(s): "AMMONIA" in the last 168 hours. Diabetic: No results for input(s): "HGBA1C" in the last 72 hours. Recent Labs  Lab 12/11/22 1142 12/11/22 1641 12/11/22 2126 12/12/22 0902 12/12/22 1132  GLUCAP 262* 224* 253* 216* 367*   Cardiac Enzymes: No results for input(s): "CKTOTAL", "CKMB", "CKMBINDEX", "TROPONINI" in the last 168 hours. No results for input(s): "PROBNP" in the last 8760 hours. Coagulation Profile: No results for input(s): "INR", "PROTIME" in the last 168 hours. Thyroid Function Tests: No results for input(s): "TSH", "T4TOTAL", "FREET4", "T3FREE", "THYROIDAB" in the last 72 hours. Lipid Profile: Recent Labs    12/12/22 0223  CHOL 94  HDL 28*   LDLCALC 43  TRIG 113  CHOLHDL 3.4   Anemia Panel: No results for input(s): "VITAMINB12", "FOLATE", "FERRITIN", "TIBC", "IRON", "RETICCTPCT" in the last 72 hours. Urine analysis:    Component Value Date/Time   COLORURINE YELLOW 12/10/2022 1810   APPEARANCEUR TURBID (A) 12/10/2022 1810   LABSPEC 1.012 12/10/2022 1810   PHURINE 5.0 12/10/2022 1810   GLUCOSEU 150 (A) 12/10/2022 1810   HGBUR SMALL (A) 12/10/2022 1810   BILIRUBINUR NEGATIVE 12/10/2022 1810   KETONESUR NEGATIVE 12/10/2022 1810   PROTEINUR 100 (A) 12/10/2022 1810   NITRITE NEGATIVE 12/10/2022 1810   LEUKOCYTESUR MODERATE (A) 12/10/2022 1810   Sepsis Labs: Invalid input(s): "PROCALCITONIN", "LACTICIDVEN"  Microbiology: Recent Results (from the past 240 hour(s))  Resp panel by RT-PCR (RSV, Flu A&B, Covid) Anterior Nasal Swab     Status: None   Collection Time: 12/08/22  1:08 AM   Specimen: Anterior Nasal Swab  Result Value Ref Range Status   SARS Coronavirus 2 by RT PCR NEGATIVE NEGATIVE Final   Influenza A by PCR NEGATIVE NEGATIVE Final   Influenza B by PCR NEGATIVE NEGATIVE Final    Comment: (NOTE) The Xpert Xpress SARS-CoV-2/FLU/RSV plus assay is intended as an aid in the diagnosis of influenza from Nasopharyngeal swab specimens and should not be used as a sole basis for treatment. Nasal washings and aspirates are unacceptable for Xpert Xpress SARS-CoV-2/FLU/RSV testing.  Fact Sheet for Patients: EntrepreneurPulse.com.au  Fact Sheet for Healthcare Providers: IncredibleEmployment.be  This test is not yet approved or cleared by the [place] FDA and has been authorized for detection and/or diagnosis of SARS-CoV-2 by FDA under an Emergency Use Authorization (EUA). This EUA will remain in effect (meaning this test can be used) for the duration of the COVID-19 declaration under Section 564(b)(1) of the Act, 21 U.S.C. section 360bbb-3(b)(1), unless the authorization is  terminated or revoked.     Resp Syncytial Virus by PCR NEGATIVE NEGATIVE Final    Comment: (NOTE) Fact Sheet for Patients: EntrepreneurPulse.com.au  Fact Sheet for Healthcare Providers: IncredibleEmployment.be  This test is not yet approved or cleared by the [place] and has been authorized for detection and/or diagnosis of SARS-CoV-2 by FDA under an Emergency Use Authorization (EUA). This EUA will remain in effect (meaning this test can be used) for the duration of the COVID-19 declaration under Section 564(b)(1) of the Act, 21 U.S.C. section 360bbb-3(b)(1), unless the authorization is terminated or revoked.  Performed at [facility], [place] [address] [place], [place] [address]   Urine Culture     Status: None   Collection Time: 12/10/22  6:10 PM   Specimen: Urine, Random  Result Value Ref Range Status   Specimen Description URINE, RANDOM  Final   Special Requests URINE, CLEAN CATCH  Final   Culture   Final    NO GROWTH Performed at [facility], [place] [address], [place], [place] [address]    Report Status 12/12/2022 FINAL  Final    Radiology Studies: CT RENAL STONE STUDY  Result Date: 12/11/2022 CLINICAL DATA:  Severe hydronephrosis. EXAM: CT ABDOMEN AND PELVIS WITHOUT CONTRAST TECHNIQUE: Multidetector CT imaging of the abdomen and pelvis was performed following the standard protocol without IV contrast. RADIATION DOSE REDUCTION: This exam was performed according to the departmental dose-optimization program which includes automated exposure control, adjustment of the mA and/or kV according to patient size and/or use of iterative reconstruction technique. COMPARISON:  01/05/2021. FINDINGS: Lower chest: Coronary artery calcifications are noted. Atelectasis and fibrotic changes are noted at the lung bases. Hepatobiliary: Hypodensity is noted in the left lobe of the liver, most likely cyst or hemangioma. No biliary ductal  dilatation. The gallbladder is without stones. Pancreas: Unremarkable. No pancreatic ductal dilatation or surrounding inflammatory changes. Spleen: Normal in size without focal abnormality. Adrenals/Urinary Tract: There is a 1.8 cm nodule in the right adrenal gland with attenuation of 8 Hounsfield units, compatible with adenoma. The left adrenal gland is within normal limits. No renal or ureteral calculus bilaterally. There is moderate to severe hydroureteronephrosis with thickening of the walls of the urothelium. Perinephric fat stranding is noted bilaterally. There is diffuse bladder wall thickening. A Foley catheter is present in the urinary bladder. Stomach/Bowel: Stomach is within normal limits. Appendix appears normal. No evidence of bowel wall thickening, distention, or inflammatory changes. No free air or pneumatosis. Vascular/Lymphatic: Aortic atherosclerosis. No enlarged abdominal or pelvic lymph nodes. Reproductive: The prostate gland is enlarged. Other: No abdominopelvic ascites. A fat containing umbilical hernia is present. Musculoskeletal: Degenerative changes are present in the thoracolumbar spine. There is a stable compression deformity at L1. No acute osseous abnormality. IMPRESSION: 1. Moderate-to-severe hydroureteronephrosis bilaterally with no evidence of obstructing calculus. Urothelial thickening is noted bilaterally in the possibility of superimposed infection can not be excluded. 2. Diffuse bladder wall thickening with Foley catheter in place, compatible with cystitis. 3. Enlarged prostate gland. 4. Right adrenal adenoma. 5. Aortic atherosclerosis and coronary artery calcifications Electronically Signed   By: Brett Fairy M.D.   On: 12/11/2022 20:33      [name] T. Ledbetter  If 7PM-7AM, please contact night-coverage [email or web address] 12/12/2022, 12:33 PM

## 2022-12-12 NOTE — Evaluation (Signed)
Occupational Therapy Evaluation Patient Details Name: Kevin [name] MRN: [id number] DOB: [birth year] Today's Date: 12/12/2022   History of Present Illness Pt is a 82 y/o M presenting to ED on 3/12 with SOB and hypoxemia, admitted for acute on chronic respiratory failure wtih hypoxia. PMH includes COPD, chronic hypoxic respiratory failure on 3L PRN, diastolic CHF, CKD IIIB, recent RUL mass, A fib s/p DCCV on 3/7 on Eliquis, DM2 with L foot ulcer, CVA, PUD s/p L TMA and BPH   Clinical Impression   Pt reports independence at baseline with ADLs and functional mobility, lives with spouse who can assist at d/c. Pt with decreased safety awareness/awareness of deficits, needing set up -mod A for ADLs, min guard A for bed mobility, and min A for transfers with 1 person HHA. Pt needing up to mod A to control descent from standing to sitting EOB as pt quickly sitting without reaching back for UE support. SpO2 down to 91% on 4L O2 during session. Pt presenting with impairments listed below, will follow acutely. Recommend HHOT at d/c pending progression.     Recommendations for follow up therapy are one component of a multi-disciplinary discharge planning process, led by the attending physician.  Recommendations may be updated based on patient status, additional functional criteria and insurance authorization.   Follow Up Recommendations  Home health OT    Assistance Recommended at Discharge Intermittent Supervision/Assistance  Patient can return home with the following A lot of help with walking and/or transfers;A lot of help with bathing/dressing/bathroom;Assistance with cooking/housework;Assist for transportation;Help with stairs or ramp for entrance;Direct supervision/assist for medications management;Direct supervision/assist for financial management    Functional Status Assessment  Patient has had a recent decline in their functional status and demonstrates the ability to make significant  improvements in function in a reasonable and predictable amount of time.  Equipment Recommendations  BSC/3in1    Recommendations for Other Services PT consult     Precautions / Restrictions Precautions Precautions: Fall Restrictions Weight Bearing Restrictions: No      Mobility Bed Mobility Overal bed mobility: Needs Assistance Bed Mobility: Sit to Supine, Supine to Sit     Supine to sit: Min guard Sit to supine: Min guard        Transfers Overall transfer level: Needs assistance Equipment used: 1 person hand held assist Transfers: Sit to/from Stand Sit to Stand: Min assist           General transfer comment: min A, pt needing x2 standing rest breaks with short distance ambulation (~20 ft), needing up to mod A to descent from standing to sitting on bed      Balance Overall balance assessment: Needs assistance Sitting-balance support: Feet supported Sitting balance-Leahy Scale: Fair     Standing balance support: During functional activity Standing balance-Leahy Scale: Poor Standing balance comment: reliant on external support                           ADL either performed or assessed with clinical judgement   ADL Overall ADL's : Needs assistance/impaired Eating/Feeding: Set up   Grooming: Set up;Sitting   Upper Body Bathing: Min guard   Lower Body Bathing: Moderate assistance   Upper Body Dressing : Min guard   Lower Body Dressing: Moderate assistance   Toilet Transfer: Minimal assistance   Toileting- Clothing Manipulation and Hygiene: Maximal assistance Toileting - Clothing Manipulation Details (indicate cue type and reason): catheter     Functional mobility during  ADLs: Minimal assistance       Vision   Vision Assessment?: No apparent visual deficits     Perception Perception Perception Tested?: No   Praxis Praxis Praxis tested?: Not tested    Pertinent Vitals/Pain Pain Assessment Pain Assessment: No/denies pain      Hand Dominance Right   Extremity/Trunk Assessment Upper Extremity Assessment Upper Extremity Assessment: Defer to OT evaluation;Overall [facility] for tasks assessed   Lower Extremity Assessment Lower Extremity Assessment: Generalized weakness   Cervical / Trunk Assessment Cervical / Trunk Assessment: Normal   Communication Communication Communication: No difficulties   Cognition Arousal/Alertness: Awake/alert Behavior During Therapy: WFL for tasks assessed/performed Overall Cognitive Status: Impaired/Different from baseline Area of Impairment: Safety/judgement, Following commands, Awareness                       Following Commands: Follows one step commands consistently Safety/Judgement: Decreased awareness of safety, Decreased awareness of deficits Awareness: Emergent   General Comments: poor awareness of deficits/safety,     General Comments  SpO2 down to 91% with mobility on 4L O2    Exercises     Shoulder Instructions      Home Living Family/patient expects to be discharged to:: Private residence Living Arrangements: Spouse/significant other Available Help at Discharge: Family;Available 24 hours/day Type of Home: House Home Access: Stairs to enter [employer] of Steps: 4 Entrance Stairs-Rails: Right;Left Home Layout: One level     Bathroom Shower/Tub: Occupational psychologist: Standard     Home Equipment: Grab bars - tub/shower;Shower Land (2 wheels);Wheelchair - manual   Additional Comments: 3L O2 as needed at home      Prior Functioning/Environment Prior Level of Function : Independent/Modified Independent             Mobility Comments: ind ADLs Comments: ind        OT Problem List: Decreased strength;Decreased range of motion;Decreased activity tolerance;Impaired balance (sitting and/or standing);Decreased safety awareness;Decreased cognition;Cardiopulmonary status limiting activity      OT  Treatment/Interventions: Self-care/ADL training;Therapeutic exercise;Energy conservation;DME and/or AE instruction;Therapeutic activities;Patient/family education;Balance training    OT Goals(Current goals can be found in the care plan section) Acute Rehab OT Goals OT Goal Formulation: With patient Time For Goal Achievement: 12/26/22 Potential to Achieve Goals: Good ADL Goals Pt Will Perform Lower Body Dressing: with min guard assist;sitting/lateral leans;sit to/from stand Pt Will Transfer to Toilet: with min guard assist;ambulating;regular height toilet Pt Will Perform Tub/Shower Transfer: Tub transfer;Shower transfer;with min guard assist;ambulating;shower seat  OT Frequency: Min 2X/week    Co-evaluation              AM-PAC OT "6 Clicks" Daily Activity     Outcome Measure Help from another person eating meals?: A Little Help from another person taking care of personal grooming?: A Little Help from another person toileting, which includes using toliet, bedpan, or urinal?: A Little Help from another person bathing (including washing, rinsing, drying)?: A Lot Help from another person to put on and taking off regular upper body clothing?: A Little Help from another person to put on and taking off regular lower body clothing?: A Lot 6 Click Score: 16   End of Session Equipment Utilized During Treatment: Gait belt;Oxygen (4L) Nurse Communication: Mobility status  Activity Tolerance: Patient tolerated treatment well Patient left: in bed;with call bell/phone within reach;with bed alarm set  OT Visit Diagnosis: Unsteadiness on feet (R26.81);Other abnormalities of gait and mobility (R26.89);Muscle weakness (generalized) (M62.81)  [id number] OT Time Calculation (min): 18 min Charges:  OT General Charges $OT Visit: 1 Visit OT Evaluation $OT Eval Moderate Complexity: 1 Mod  Kevin [name], OTD, OTR/L [employer] Preferred Acute Rehab [phone]  Kevin [name]  [place] 12/12/2022, 3:16 PM

## 2022-12-13 ENCOUNTER — Telehealth: Payer: Self-pay | Admitting: Cardiovascular Disease

## 2022-12-13 ENCOUNTER — Ambulatory Visit: Payer: Medicare Other | Admitting: General Practice

## 2022-12-13 DIAGNOSIS — N1832 Chronic kidney disease, stage 3b: Secondary | ICD-10-CM | POA: Diagnosis not present

## 2022-12-13 DIAGNOSIS — I4819 Other persistent atrial fibrillation: Secondary | ICD-10-CM

## 2022-12-13 DIAGNOSIS — I5033 Acute on chronic diastolic (congestive) heart failure: Secondary | ICD-10-CM | POA: Diagnosis not present

## 2022-12-13 DIAGNOSIS — J9621 Acute and chronic respiratory failure with hypoxia: Secondary | ICD-10-CM | POA: Diagnosis not present

## 2022-12-13 DIAGNOSIS — J449 Chronic obstructive pulmonary disease, unspecified: Secondary | ICD-10-CM | POA: Diagnosis not present

## 2022-12-13 DIAGNOSIS — I4891 Unspecified atrial fibrillation: Secondary | ICD-10-CM

## 2022-12-13 DIAGNOSIS — N179 Acute kidney failure, unspecified: Secondary | ICD-10-CM | POA: Diagnosis not present

## 2022-12-13 DIAGNOSIS — I480 Paroxysmal atrial fibrillation: Secondary | ICD-10-CM | POA: Diagnosis not present

## 2022-12-13 LAB — COMPREHENSIVE METABOLIC PANEL
ALT: 78 U/L — ABNORMAL HIGH (ref 0–44)
AST: 72 U/L — ABNORMAL HIGH (ref 15–41)
Albumin: 2.7 g/dL — ABNORMAL LOW (ref 3.5–5.0)
Alkaline Phosphatase: 129 U/L — ABNORMAL HIGH (ref 38–126)
Anion gap: 13 (ref 5–15)
BUN: 53 mg/dL — ABNORMAL HIGH (ref 8–23)
CO2: 19 mmol/L — ABNORMAL LOW (ref 22–32)
Calcium: 8.8 mg/dL — ABNORMAL LOW (ref 8.9–10.3)
Chloride: 102 mmol/L (ref 98–111)
Creatinine, Ser: 1.99 mg/dL — ABNORMAL HIGH (ref 0.61–1.24)
GFR, Estimated: 34 mL/min — ABNORMAL LOW (ref >60)
Glucose, Bld: 91 mg/dL (ref 70–99)
Potassium: 4.1 mmol/L (ref 3.5–5.1)
Sodium: 134 mmol/L — ABNORMAL LOW (ref 135–145)
Total Bilirubin: 0.6 mg/dL (ref 0.3–1.2)
Total Protein: 7.4 g/dL (ref 6.5–8.1)

## 2022-12-13 LAB — CK: Total CK: 42 U/L — ABNORMAL LOW (ref 49–397)

## 2022-12-13 LAB — PHOSPHORUS: Phosphorus: 4.2 mg/dL (ref 2.5–4.6)

## 2022-12-13 LAB — MAGNESIUM: Magnesium: 2.1 mg/dL (ref 1.7–2.4)

## 2022-12-13 LAB — GLUCOSE, CAPILLARY: Glucose-Capillary: 166 mg/dL — ABNORMAL HIGH (ref 70–99)

## 2022-12-13 MED ORDER — INSULIN ASPART PROT & ASPART (70-30 MIX) 100 UNIT/ML PEN: 27.0000 [IU] | PEN_INJECTOR | Freq: Two times a day (BID) | SUBCUTANEOUS | 11 refills | Status: DC

## 2022-12-13 MED ORDER — BLOOD GLUCOSE TEST VI STRP: 1.0000 | ORAL_STRIP | Freq: Two times a day (BID) | 0 refills | Status: DC

## 2022-12-13 MED ORDER — FLUCONAZOLE 200 MG PO TABS: 200.0000 mg | ORAL_TABLET | Freq: Every day | ORAL | 0 refills | Status: AC

## 2022-12-13 MED ORDER — DOXYCYCLINE HYCLATE 100 MG PO TABS: 100.0000 mg | ORAL_TABLET | Freq: Two times a day (BID) | ORAL | 0 refills | Status: AC

## 2022-12-13 MED ORDER — AMOXICILLIN-POT CLAVULANATE 875-125 MG PO TABS: 1.0000 | ORAL_TABLET | Freq: Two times a day (BID) | ORAL | 0 refills | Status: AC

## 2022-12-13 MED ORDER — "PEN NEEDLES 3/16"" 31G X 5 MM MISC": 1.0000 | Freq: Two times a day (BID) | 1 refills | Status: DC

## 2022-12-13 MED ORDER — SENNOSIDES-DOCUSATE SODIUM 8.6-50 MG PO TABS: 2.0000 | ORAL_TABLET | Freq: Two times a day (BID) | ORAL | Status: DC | PRN

## 2022-12-13 MED ORDER — BLOOD GLUCOSE MONITORING SUPPL DEVI: 1.0000 | Freq: Three times a day (TID) | 0 refills | Status: DC

## 2022-12-13 MED ORDER — LANCET DEVICE MISC: 1.0000 | Freq: Two times a day (BID) | 0 refills | Status: DC

## 2022-12-13 MED ORDER — TRIMETHOPRIM 100 MG PO TABS: 100.0000 mg | ORAL_TABLET | Freq: Every day | ORAL | 11 refills | Status: DC

## 2022-12-13 MED ORDER — DILTIAZEM HCL ER COATED BEADS 120 MG PO CP24: 120.0000 mg | ORAL_CAPSULE | Freq: Every day | ORAL | 0 refills | Status: DC

## 2022-12-13 MED ORDER — METOPROLOL SUCCINATE ER 50 MG PO TB24: 50.0000 mg | ORAL_TABLET | Freq: Every day | ORAL | 3 refills | Status: DC

## 2022-12-13 NOTE — Telephone Encounter (Signed)
Called patient and spouse to discuss concerns about low HR and unclear discharge instructions as patient was discharged from hospital today after 5 day stay (12-08-22 through 12-13-22). Patient was treated for afib, UTI, CHF and foot wound. Patient's spouse states that patient's HR is 42, BP 119/76. Patient denies SOB, chest pain or lightheadedness but spouse states she manually palpated patient's pulse and it was irregular. D/C instructions state that patient should "Hold metoprolol until you speak to your cardiologist". I asked patient's spouse to check pulse again, this time HR was 63. Spoke with Dr. [name] who was able to clarify that patient should take 120 mg cardizem daily and 50 mg metoprolol succinate daily. Dr. [name] further advises that patient should take cardizem with breakfast and metoprolol with lunch to see if that helps patient to avoid low HR's. Patient with afib clinic follow up on 12/27/22. Reviewed plan with patient and spouse who verbalize understanding and agree to plan. Med list updated.

## 2022-12-13 NOTE — Plan of Care (Signed)
  Problem: Education: Goal: Ability to describe self-care measures that may prevent or decrease complications (Diabetes Survival Skills Education) will improve Outcome: Adequate for Discharge Goal: Individualized Educational Video(s) Outcome: Adequate for Discharge   Problem: Coping: Goal: Ability to adjust to condition or change in health will improve Outcome: Adequate for Discharge   Problem: Fluid Volume: Goal: Ability to maintain a balanced intake and output will improve Outcome: Adequate for Discharge   Problem: Health Behavior/Discharge Planning: Goal: Ability to identify and utilize available resources and services will improve Outcome: Adequate for Discharge Goal: Ability to manage health-related needs will improve Outcome: Adequate for Discharge   Problem: Metabolic: Goal: Ability to maintain appropriate glucose levels will improve Outcome: Adequate for Discharge   Problem: Nutritional: Goal: Maintenance of adequate nutrition will improve Outcome: Adequate for Discharge Goal: Progress toward achieving an optimal weight will improve Outcome: Adequate for Discharge   Problem: Skin Integrity: Goal: Risk for impaired skin integrity will decrease Outcome: Adequate for Discharge   Problem: Tissue Perfusion: Goal: Adequacy of tissue perfusion will improve Outcome: Adequate for Discharge   Problem: Education: Goal: Knowledge of General Education information will improve Description: Including pain rating scale, medication(s)/side effects and non-pharmacologic comfort measures Outcome: Adequate for Discharge   Problem: Health Behavior/Discharge Planning: Goal: Ability to manage health-related needs will improve Outcome: Adequate for Discharge   Problem: Clinical Measurements: Goal: Ability to maintain clinical measurements within normal limits will improve Outcome: Adequate for Discharge Goal: Will remain free from infection Outcome: Adequate for Discharge Goal:  Diagnostic test results will improve Outcome: Adequate for Discharge Goal: Respiratory complications will improve Outcome: Adequate for Discharge Goal: Cardiovascular complication will be avoided Outcome: Adequate for Discharge   Problem: Activity: Goal: Risk for activity intolerance will decrease Outcome: Adequate for Discharge   Problem: Nutrition: Goal: Adequate nutrition will be maintained Outcome: Adequate for Discharge   Problem: Coping: Goal: Level of anxiety will decrease Outcome: Adequate for Discharge   Problem: Elimination: Goal: Will not experience complications related to bowel motility Outcome: Adequate for Discharge Goal: Will not experience complications related to urinary retention Outcome: Adequate for Discharge

## 2022-12-13 NOTE — TOC Initial Note (Signed)
Transition of Care [facility]) - Initial/Assessment Note    Patient Details  Name: Kevin [name] MRN: [id number] Date of Birth: [birth year]  [facility]) CM/SW Contact:    Bethena Roys, RN Phone Number: 12/13/2022, 10:14 AM  Clinical Narrative: Patient presented for tachycardia- plan will be for home today. Case Manager discussed home health physical therapy and the patient declined services. Patient will follow up with PCP if he needs services in the future. Patient is currently getting urinary supplies from [facility]. No further needs identified at this time.                   Expected Discharge Plan: Home/Self Care Barriers to Discharge: No Barriers Identified   Patient Goals and CMS Choice Patient states their goals for this hospitalization and ongoing recovery are:: to return home.   Choice offered to / list presented to : NA   Expected Discharge Plan and Services In-house Referral: NA Discharge Planning Services: CM Consult Post Acute Care Choice: NA Living arrangements for the past 2 months: Single Family Home Expected Discharge Date: 12/13/22                  HH Arranged: Refused HH (will f/u with PCP- if he needs in the future.)    Prior Living Arrangements/Services Living arrangements for the past 2 months: Single Family Home Lives with:: Spouse Patient language and need for interpreter reviewed:: Yes Do you feel safe going back to the place where you live?: Yes      Need for Family Participation in Patient Care: Yes (Comment) Care giver support system in place?: Yes (comment)   Criminal Activity/Legal Involvement Pertinent to Current Situation/Hospitalization: No - Comment as needed  Activities of Daily Living Home Assistive Devices/Equipment: Eyeglasses ADL Screening (condition at time of admission) Patient's cognitive ability adequate to safely complete daily activities?: Yes Is the patient deaf or have difficulty hearing?:  No Does the patient have difficulty seeing, even when wearing glasses/contacts?: No Does the patient have difficulty concentrating, remembering, or making decisions?: No Patient able to express need for assistance with ADLs?: Yes Does the patient have difficulty dressing or bathing?: No Independently performs ADLs?: Yes (appropriate for developmental age) Does the patient have difficulty walking or climbing stairs?: Yes Weakness of Legs: None Weakness of Arms/Hands: None  Permission Sought/Granted Permission sought to share information with : Family Supports, Case Manager    Emotional Assessment Appearance:: Appears stated age Attitude/Demeanor/Rapport: Engaged Affect (typically observed): Appropriate Orientation: : Oriented to Self, Oriented to Place Alcohol / Substance Use: Not Applicable Psych Involvement: No (comment)  Admission diagnosis:  Shortness of breath [R06.02] Hypoxia [R09.02] Acute respiratory failure with hypoxia ([place]) [J96.01] Patient Active Problem List   Diagnosis Date Noted   Bilateral hydronephrosis 12/11/2022   CAD (coronary artery disease) 12/09/2022   Mass of upper lobe of right lung 12/08/2022   Acute on chronic respiratory failure with hypoxia ([place]) 12/08/2022   History of recurrent UTI (urinary tract infection) 12/08/2022   DNR (do not resuscitate) 12/08/2022   Hypoxia 12/08/2022   Shortness of breath 12/08/2022   History of amputation of left foot ([place]) 06/09/2022   Cough 05/18/2022   Pulmonary nodules/lesions, multiple 01/08/2021   COPD (chronic obstructive pulmonary disease) ([place]) 06/26/2020   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   Chronic kidney disease, stage 3b ([place]) 08/20/2019   Chronic respiratory failure ([place]) 08/20/2019   Pneumonia due to COVID-19 virus 08/19/2019   UTI (urinary tract infection)  12/15/2018   Acute on chronic diastolic CHF (congestive heart failure) ([place]) 12/15/2018   HLD (hyperlipidemia) 12/15/2018    Dehiscence of amputation stump ([place])    Renal insufficiency 05/06/2018   Insulin dependent type 2 diabetes mellitus ([place]) 05/06/2018   Debility 03/31/2018   Urinary retention with incomplete bladder emptying 03/31/2018   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 [facility])    Diabetic foot ulcer ([place])    Status post transmetatarsal amputation of left foot (HCC)    Postoperative pain    Benign prostatic hyperplasia with urinary retention    New onset atrial fibrillation (HCC)    Incontinence of feces    Cerebrovascular accident (CVA) ([place])    Benign essential HTN    Diabetes mellitus type 2 in nonobese ([place])    Hypokalemia    PAF (paroxysmal atrial fibrillation) (HCC)    Leukocytosis    SIRS (systemic inflammatory response syndrome) ([place])    AKI (acute kidney injury) ([place])    Essential hypertension 03/20/2018   Tachycardia 03/20/2018   Sepsis ([place]) 03/20/2018   Diabetes ([place]) 03/20/2018   Osteomyelitis ([place]) 03/20/2018   A-fib ([place]) 03/20/2018   Stroke-like episode ([place]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 03/16/2018   Strain of left tibialis anterior muscle 12/19/2015   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   Fracture of radius, distal, left, closed 12/19/2012   H/O ulcerative colitis 1993   PCP:  [name], MD Pharmacy:   [id number] [place], [address] [place] [address] Phone: [phone] Fax: [phone]  [facility] [place] [place] [facility] [address] [place] [address] Phone: [phone] Fax: [phone]  [facility] ([place], [facility] [address] [address] Phone: [phone] Fax: [phone]  Social Determinants of Health (SDOH) Social History: SDOH Screenings   Food Insecurity: No Food Insecurity (12/08/2022)  Housing: Low  Risk  (12/08/2022)  Transportation Needs: No Transportation Needs (12/08/2022)  Utilities: Not At Risk (12/08/2022)  Tobacco Use: Medium Risk (12/08/2022)   Readmission Risk Interventions     No data to display

## 2022-12-13 NOTE — Telephone Encounter (Signed)
Pt c/o medication issue:  1. Name of Medication: metoprolol succinate (TOPROL XL) 50 MG 24 hr tablet   2. How are you currently taking this medication (dosage and times per day)? Was given meds at hospital, but not sure  3. Are you having a reaction (difficulty breathing--STAT)? HR running between 38-40  4. What is your medication issue? Patient discharged from hospital earlier today, and which is unsure how to continue with this medication.

## 2022-12-13 NOTE — Discharge Summary (Signed)
Physician Discharge Summary  Kevin [name] [id number] DOB: [birth year] DOA: 12/07/2022  PCP: [name], MD  Admit date: 12/07/2022 Discharge date: 12/13/2022 Admitted From: Home Disposition: Home Recommendations for Outpatient Follow-up:  Follow up with PCP in 1 to 2 weeks. Outpatient follow-up with endocrinologist as previously planned Pulmonology, urology and cardiology to arrange outpatient follow-up Outpatient follow-up with orthopedic surgery as previously planned Check glycemic control, CMP and CBC at follow-up Please follow up on the following pending results: None  Home Health: Patient refused home health. Equipment/Devices: Patient has home oxygen.  Discharge Condition: Stable CODE STATUS: Full code  Follow-up Information     [name], MD. Schedule an appointment as soon as possible for a visit in 1 week(s).   Specialty: Family Medicine Contact information: [address] [address] [phone]                 Hospital course 82 year old M with PMH of COPD/chronic hypoxic RF on 3 L as needed, diastolic CHF, [id number], recent RUL mass, A-fib s/p DCCV on 3/7 on Eliquis, DM-2 with left foot ulcer, CVA, PUD s/p left TMA and BPH presenting with shortness of breath and hypoxemia to 81 on room air which has improved to 90% after returning home oxygen.  He was admitted for acute on chronic respiratory failure with hypoxia in the setting of COPD and CHF exacerbation and A-fib.  He was back in A-fib after DCCV.  Initially started on IV diuretics.  Cardiology consulted and recommended continuing Toprol and Eliquis until outpatient follow-up.     Diuretics discontinued due to AKI.   Nephrology consulted.  Renal US showed moderate to severe bilateral hydronephrosis without obstructing calculus, urothelial thickening, diffuse bladder wall thickening, enlarged prostate and right adrenal adenoma.  Foley catheter placed by urology.  Per  urology, cloudy urine output during Foley insertion.  Changed Augmentin to ceftriaxone.  Also started on Diflucan per recommendation by urology based on his urine culture from urology office about 2 weeks ago that showed yeast.  Urine culture NGTD during this hospitalization.  He is discharged on Diflucan for 6 more days to complete 1 week course recommended by his urologist.  Foley catheter discontinued on discharge.  Patient will do in and out cath at least 3 times a day as recommended by urology.  AKI resolved at time of discharge.   Pulmonology consulted for COPD and RUL mass.  There is plan for outpatient biopsy of RUL mass by pulmonology.  Patient to continue on inhalers.  He has home oxygen.   Patient has diabetic foot ulcer.  MRI showed osteomyelitis.  Orthopedic surgery, [name] Given consulted and recommended p.o. Augmentin and doxycycline until his follow-up on 3/21 for possible BKA.   Patient has poorly controlled diabetes with A1c of 10.2%.  He was on Lantus and Jardiance prior to this admission.  Discharged on insulin 70/30 at 27 units twice daily which would suit his he is twice daily eating habit.  Discontinued Jardiance due to significant risk for UTI and current yeast infection.  Counseled on the importance of watching his diet.  Also counseled on hypoglycemia and its management.  See individual problem list below for more.   Problems addressed during this hospitalization Principal Problem:   Acute on chronic respiratory failure with hypoxia ([place]) Active Problems:   Essential hypertension   Osteomyelitis (HCC)   Cerebrovascular accident (CVA) ([place])   Benign essential HTN   PAF (paroxysmal atrial fibrillation) ([place])  AKI (acute kidney injury) ([place])   Diabetic foot ulcer (HCC)   Insulin dependent type 2 diabetes mellitus ([place])   Acute on chronic diastolic CHF (congestive heart failure) (HCC)   Chronic kidney disease, stage 3b (HCC)   COPD (chronic obstructive pulmonary disease)  (HCC)   Mass of upper lobe of right lung   History of recurrent UTI (urinary tract infection)   DNR (do not resuscitate)   CAD (coronary artery disease)   Bilateral hydronephrosis   Acute on chronic respiratory failure with hypoxia: Multifactorial including COPD, CHF and possibly underlying RUL mass.  Low suspicion for PE or pneumonia.  CT chest without contrast with a large RUL nodule, unchanged left-sided pulmonary nodule, right adrenal adenoma and possible esophageal dysmotility/GERD.  BNP elevated to 475.  Initially diuresed with IV Lasix for CHF which was discontinued due to AKI.  -Outpatient follow-up with pulmonology -Continue home [place] and as needed albuterol -Appreciate pulmonary recs   Acute on chronic diastolic CHF: Initially diuresed with IV Lasix due to concern for CHF exacerbation.  BNP was elevated but no pulmonary edema on chest x-ray.  Diuretics discontinued due to AKI.  TTE with LVEF of 55 to 60%, indeterminate DD and RVSP of 30.5 mmHg.  Excellent urine output.  Net negative. -Outpatient follow-up with cardiology   Paroxysmal A-fib: S/p DCCV on 3/7.  Back in A-fib but rate controlled. -Continue Cardizem, metoprolol and Eliquis -Outpatient follow-up with cardiology   AKI on CKD-3B: Initially felt to be due to diuretics.  Renal US with severe bilateral hydronephrosis.  CT renal stone study with moderate to severe bilateral hydro, urothelial and bladder wall thickening but no calculus.  UA concerning for UTI.  Urine culture NGTD.  Patient reports sense of incomplete void and white urine for about 2 weeks.  He states he had urine culture at urology office that showed a yeast about 2 weeks ago.  Excellent urine output.  AKI resolved. Recent Labs    11/25/22 1213 12/07/22 2130 12/08/22 0156 12/09/22 0133 12/10/22 0254 12/11/22 0104 12/12/22 0223 12/13/22 0237  BUN 32* 23 20 30* 47* 69* 60* 53*  CREATININE 1.96* 1.94* 2.07* 2.57* 3.37* 3.45* 2.39* 1.99*  -Per urology,  okay to remove Foley with instruction to perform clean intermittent cath at least 3 times a day once improved -Diflucan for total of 1 week per urology -Recheck renal function at follow-up. -Jardiance discontinued.   Left foot ulcer and osteomyelitis: Had left TMA for PVD in the past.  Has Wagner ulcer along the left anterior foot.  Followed by [name] Given.  MRI concerning for osteomyelitis. -[name] Given consulted and recommended p.o. Augmentin and doxycycline until his follow-up on 3/21 -Optimize glycemic control   Severe bilateral hydronephrosis/BPH with LUTS: Reports sense of incomplete void and white urine for about 2 weeks.  He says he has urine culture at urology office that showed yeast.  He is followed by Dr. Gloriann Loan. -See urology recommendation as above -Continues on Flomax. -Discontinued oxybutynin on discharge.   History of CAD: Stable.  No chest pain.  TTE without RWMA. -Continue home meds   Chronic COPD: Doubt exacerbation. -Continue bronchodilators and inhalers -Outpatient follow-up with pulmonology   Uncontrolled IDDM-2 with CKD-3B, hyperglycemia and diabetic foot ulcer: A1c 10.2%. Recent Labs  Lab 12/12/22 0902 12/12/22 1132 12/12/22 1809 12/12/22 2052 12/13/22 0758  GLUCAP 216* 367* 135* 158* 166*  -Discharged on Novolin 70/30 at 27 units twice daily -Discontinued Jardiance. -Continue Crestor.   Pulmonary nodule/mass: Followed by pulmonology,  Dr. Lamonte Sakai.  Outpatient imaging showed RUL spiculated mass as well as left-sided pulmonary nodules.  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  Has upcoming appointment with Dr. Lamonte Sakai -Outpatient follow-up with pulmonology   Elevated liver enzymes: Mild and improved. -Continue monitoring   Essential hypertension: Normotensive for most part. -Continue metoprolol and Cardizem   Hyponatremia: Likely due to renal failure and possible SIADH.  Improved. -Continue monitoring   Physical  deconditioning -OOB/PT/OT   History of CVA: Stable.  No focal neurodeficit.   Goal of care: DNR/DNI.           Vital signs Vitals:   12/12/22 2048 12/13/22 0457 12/13/22 0751 12/13/22 0800  BP: 112/73 103/68  104/61  Pulse: (!) 55 67  60  Temp: 98 F (36.7 C) 97.8 F (36.6 C)  97.8 F (36.6 C)  Resp: 18 16  19   Height:      Weight:  98.4 kg    SpO2: 94% 95% 97% 92%  TempSrc: Oral Oral  Oral  BMI (Calculated):  27.84       Discharge exam  GENERAL: No apparent distress.  Nontoxic. HEENT: MMM.  Vision and hearing grossly intact.  NECK: Supple.  No apparent JVD.  RESP:  No IWOB.  Fair aeration bilaterally. CVS: Irregular rhythm.  Normal rate.  Heart sounds normal.  ABD/GI/GU: BS+. Abd soft, NTND.  MSK/EXT:  Moves extremities.   Left TMA amputation. SKIN: Small ulceration over plantar aspect of left TMA.  See picture below.  NEURO: Awake and alert. Oriented appropriately.  No apparent focal neuro deficit. PSYCH: Calm. Normal affect.   Discharge Instructions Discharge Instructions     Diet - low sodium heart healthy   Complete by: As directed    Diet Carb Modified   Complete by: As directed    Discharge instructions   Complete by: As directed    It has been a pleasure taking care of you!  You were hospitalized due to shortness of breath likely from heart failure and underlying lung disease, acute kidney injury due to fluid medication and urinary obstruction.  You have been treated.  Your symptoms improved.  Your kidney function improved as well.  We are discharging you on Diflucan for possible yeast infection.  Urology recommended clean self-catheterization at least 3 times a day.  It is also very important that you have your diabetes under good control to reduce your risk of infection.  We have stopped Jardiance and started you on insulin.  Please use your insulin as prescribed.  In addition to using medication, it is very important that you minimize high  sugary/carbohydrate meals and drinks.  See a separate instruction about the diabetes and diet and exercise.  In regards to lung mass, follow-up with pulmonology per their recommendation.  In regards to your foot infection, we are discharging you on Augmentin and doxycycline as recommended by [name] Given.  Follow-up with [name] Given as previously planned.    Follow-up with your primary care doctor in 1 to 2 weeks or sooner if needed.     Take care,   Discharge wound care:   Complete by: As directed    Wound care  Daily      Comments: Medihoney to left foot, change Q 3 days or PRN soiling. Cover with foam dressings, change foam dressings Q 3 days or PRN soiling.   Increase activity slowly   Complete by: As directed       Allergies as  of 12/13/2022   No Known Allergies      Medication List     STOP taking these medications    empagliflozin 10 MG Tabs tablet Commonly known as: JARDIANCE   furosemide 20 MG tablet Commonly known as: LASIX   multivitamin with minerals Tabs tablet   oxybutynin 5 MG tablet Commonly known as: DITROPAN       TAKE these medications    albuterol 108 (90 Base) MCG/ACT inhaler Commonly known as: VENTOLIN HFA Inhale 2 puffs into the lungs every 6 (six) hours as needed for wheezing or shortness of breath.   amoxicillin-clavulanate 875-125 MG tablet Commonly known as: AUGMENTIN Take 1 tablet by mouth 2 (two) times daily for 10 days.   apixaban 5 MG Tabs tablet Commonly known as: Eliquis Take 1 tablet (5 mg total) by mouth 2 (two) times daily.   doxycycline 100 MG tablet Commonly known as: VIBRA-TABS Take 1 tablet (100 mg total) by mouth every 12 (twelve) hours for 10 days.   escitalopram 10 MG tablet Commonly known as: LEXAPRO Take 10 mg by mouth daily.   fluconazole 200 MG tablet Commonly known as: DIFLUCAN Take 1 tablet (200 mg total) by mouth daily for 6 days.   insulin aspart protamine - aspart (70-30) 100 UNIT/ML FlexPen Commonly  known as: NOVOLOG 70/30 MIX Inject 27 Units into the skin 2 (two) times daily with a meal.   loratadine 10 MG tablet Commonly known as: CLARITIN Take 10 mg by mouth at bedtime.   mesalamine 1.2 g EC tablet Commonly known as: LIALDA Take 1.2 g by mouth daily.   Pen Needles 3/16" 31G X 5 MM Misc 1 Pen by Does not apply route 2 (two) times daily before a meal. Before breakfast and dinner   PRESERVISION AREDS 2 PO Take 1 capsule by mouth 2 (two) times daily.   rosuvastatin 10 MG tablet Commonly known as: CRESTOR Take 0.5 tablets (5 mg total) by mouth daily.   senna-docusate 8.6-50 MG tablet Commonly known as: Senokot-S Take 2 tablets by mouth 2 (two) times daily as needed for moderate constipation.   Stiolto Respimat 2.5-2.5 MCG/ACT Aers Generic drug: Tiotropium Bromide-Olodaterol Inhale 2 puffs into the lungs daily.   tamsulosin 0.4 MG Caps capsule Commonly known as: FLOMAX Take 1 capsule (0.4 mg total) by mouth 2 times daily at 12 noon and 4 pm. What changed: when to take this   trimethoprim 100 MG tablet Commonly known as: TRIMPEX Take 1 tablet (100 mg total) by mouth at bedtime. Start taking on: December 20, 2022 What changed: These instructions start on December 20, 2022. If you are unsure what to do until then, ask your doctor or other care provider.               Discharge Care Instructions  (From admission, onward)           Start     Ordered   12/13/22 0000  Discharge wound care:       Comments: Wound care  Daily      Comments: Medihoney to left foot, change Q 3 days or PRN soiling. Cover with foam dressings, change foam dressings Q 3 days or PRN soiling.   12/13/22 0832            Consultations: Cardiology Pulmonology Nephrology Urology  Procedures/Studies:   CT RENAL STONE STUDY  Result Date: 12/11/2022 CLINICAL DATA:  Severe hydronephrosis. EXAM: CT ABDOMEN AND PELVIS WITHOUT CONTRAST TECHNIQUE: Multidetector CT imaging of the  abdomen  and pelvis was performed following the standard protocol without IV contrast. RADIATION DOSE REDUCTION: This exam was performed according to the departmental dose-optimization program which includes automated exposure control, adjustment of the mA and/or kV according to patient size and/or use of iterative reconstruction technique. COMPARISON:  01/05/2021. FINDINGS: Lower chest: Coronary artery calcifications are noted. Atelectasis and fibrotic changes are noted at the lung bases. Hepatobiliary: Hypodensity is noted in the left lobe of the liver, most likely cyst or hemangioma. No biliary ductal dilatation. The gallbladder is without stones. Pancreas: Unremarkable. No pancreatic ductal dilatation or surrounding inflammatory changes. Spleen: Normal in size without focal abnormality. Adrenals/Urinary Tract: There is a 1.8 cm nodule in the right adrenal gland with attenuation of 8 Hounsfield units, compatible with adenoma. The left adrenal gland is within normal limits. No renal or ureteral calculus bilaterally. There is moderate to severe hydroureteronephrosis with thickening of the walls of the urothelium. Perinephric fat stranding is noted bilaterally. There is diffuse bladder wall thickening. A Foley catheter is present in the urinary bladder. Stomach/Bowel: Stomach is within normal limits. Appendix appears normal. No evidence of bowel wall thickening, distention, or inflammatory changes. No free air or pneumatosis. Vascular/Lymphatic: Aortic atherosclerosis. No enlarged abdominal or pelvic lymph nodes. Reproductive: The prostate gland is enlarged. Other: No abdominopelvic ascites. A fat containing umbilical hernia is present. Musculoskeletal: Degenerative changes are present in the thoracolumbar spine. There is a stable compression deformity at L1. No acute osseous abnormality. IMPRESSION: 1. Moderate-to-severe hydroureteronephrosis bilaterally with no evidence of obstructing calculus. Urothelial thickening is  noted bilaterally in the possibility of superimposed infection can not be excluded. 2. Diffuse bladder wall thickening with Foley catheter in place, compatible with cystitis. 3. Enlarged prostate gland. 4. Right adrenal adenoma. 5. Aortic atherosclerosis and coronary artery calcifications Electronically Signed   By: Brett Fairy M.D.   On: 12/11/2022 20:33   US RENAL  Result Date: 12/11/2022 CLINICAL DATA:  Acute kidney injury. EXAM: RENAL / URINARY TRACT ULTRASOUND COMPLETE COMPARISON:  August 20, 2019 FINDINGS: Right Kidney: Renal measurements: 11.6 cm x 6.8 cm x 5.4 cm = volume: 220.6 mL. Echogenicity within normal limits. No mass is visualized. There is marked severity right-sided hydronephrosis and hydroureter to the level of the urinary bladder. This is increased in severity when compared to the prior study. Left Kidney: Renal measurements: 10.5 cm x 6.4 cm x 6.2 cm = volume: 217.6 mL. Echogenicity within normal limits. No mass is visualized. There is marked severity left-sided hydronephrosis and hydroureter to the level of the urinary bladder. This is increased in severity when compared to the prior study. Bladder: A moderate amount of heterogeneous echogenic material is seen within the dependent portion of the urinary bladder. No abnormal flow is seen within this region on color Doppler evaluation. The right and left ureteral jets are not clearly identified. Other: The prostate gland is enlarged and measures 7.1 cm x 3.0 cm x 4.8 cm (volume 53.87 mL). IMPRESSION: 1. Marked severity bilateral hydronephrosis and hydroureter to the level of the urinary bladder. 2. Echogenic material within the urinary bladder lumen which may represent blood products. Correlation with urinalysis is recommended. Electronically Signed   By: Virgina Norfolk M.D.   On: 12/11/2022 05:30   VAS [place] LOWER EXTREMITY VENOUS (DVT)  Result Date: 12/09/2022  Lower Venous DVT Study Patient Name:  NETANEL HANELINE  Date of Exam:    12/09/2022 Medical Rec #: [id number]  Accession #:    [id number] Date of Birth: 1941/03/04           Patient Gender: M Patient Age:   70 years Exam Location:  [facility] Procedure:      VAS [place] LOWER EXTREMITY VENOUS (DVT) Referring Phys: Ane Payment DESAI --------------------------------------------------------------------------------  Indications: SOB.  Risk Factors: Paroxysmal atrial fibrillation, heart failure. Comparison Study: No prior study on file Performing Technologist: Sharion Dove RVS  Examination Guidelines: A complete evaluation includes B-mode imaging, spectral Doppler, color Doppler, and power Doppler as needed of all accessible portions of each vessel. Bilateral testing is considered an integral part of a complete examination. Limited examinations for reoccurring indications may be performed as noted. The reflux portion of the exam is performed with the patient in reverse Trendelenburg.  +---------+---------------+---------+-----------+----------+--------------+ RIGHT    CompressibilityPhasicitySpontaneityPropertiesThrombus Aging +---------+---------------+---------+-----------+----------+--------------+ CFV      Full           Yes      Yes                                 +---------+---------------+---------+-----------+----------+--------------+ SFJ      Full                                                        +---------+---------------+---------+-----------+----------+--------------+ FV Prox  Full                                                        +---------+---------------+---------+-----------+----------+--------------+ FV Mid   Full           Yes      Yes                                 +---------+---------------+---------+-----------+----------+--------------+ FV DistalFull                                                        +---------+---------------+---------+-----------+----------+--------------+ PFV      Full                                                         +---------+---------------+---------+-----------+----------+--------------+ POP      Full           Yes      Yes                                 +---------+---------------+---------+-----------+----------+--------------+ PTV      Full                                                        +---------+---------------+---------+-----------+----------+--------------+  PERO     Full                                                        +---------+---------------+---------+-----------+----------+--------------+   +---------+---------------+---------+-----------+----------+--------------+ LEFT     CompressibilityPhasicitySpontaneityPropertiesThrombus Aging +---------+---------------+---------+-----------+----------+--------------+ CFV      Full           Yes      Yes                                 +---------+---------------+---------+-----------+----------+--------------+ SFJ      Full                                                        +---------+---------------+---------+-----------+----------+--------------+ FV Prox  Full                                                        +---------+---------------+---------+-----------+----------+--------------+ FV Mid   Full           Yes      Yes                                 +---------+---------------+---------+-----------+----------+--------------+ FV DistalFull                                                        +---------+---------------+---------+-----------+----------+--------------+ PFV      Full                                                        +---------+---------------+---------+-----------+----------+--------------+ POP      Full           Yes      Yes                                 +---------+---------------+---------+-----------+----------+--------------+ PTV      Full                                                         +---------+---------------+---------+-----------+----------+--------------+ PERO     Full                                                        +---------+---------------+---------+-----------+----------+--------------+  Summary: BILATERAL: - No evidence of deep vein thrombosis seen in the lower extremities, bilaterally. -No evidence of popliteal cyst, bilaterally.   *See table(s) above for measurements and observations. Electronically signed by Orlie Pollen on 12/09/2022 at 5:36:04 PM.    Final    MR FOOT LEFT W WO CONTRAST  Result Date: 12/08/2022 CLINICAL DATA:  Plantar foot ulcer.  History of prior amputations. EXAM: MRI OF THE LEFT FOREFOOT WITHOUT AND WITH CONTRAST TECHNIQUE: Multiplanar, multisequence MR imaging of the left foot was performed both before and after administration of intravenous contrast. 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 There is a deep open wound on the plantar aspect of the foot extending down to the bone. Abnormal STIR signal intensity and enhancement in the medial cuneiform consistent with osteomyelitis. No findings suspicious for septic arthritis. No drainable soft tissue abscess or pyomyositis. IMPRESSION: 1. Deep open wound on the plantar aspect of the foot extending down to the bone. 2. Osteomyelitis involving the medial cuneiform. 3. No findings for septic arthritis or soft tissue abscess. Electronically Signed   By: Marijo Sanes M.D.   On: 12/08/2022 15:58   ECHOCARDIOGRAM COMPLETE  Result Date: 12/08/2022    ECHOCARDIOGRAM REPORT   Patient Name:   ELZIA KLASE Date of Exam: 12/08/2022 Medical Rec #:  [id number]           Height:       74.0 in Accession #:    [id number]          Weight:       220.0 lb Date of Birth:  [birth year]          BSA:          2.263 m Patient Age:    13 years            BP:           116/81 mmHg  Patient Gender: M                   HR:           81 bpm. Exam Location:  Inpatient Procedure: 2D Echo, Cardiac Doppler and Color Doppler Indications:    CHF  History:        Patient has prior history of Echocardiogram examinations, most                 recent 04/24/2020. CHF, COPD and Stroke, Arrythmias:Atrial                 Fibrillation and Tachycardia; Risk Factors:Hypertension,                 Diabetes, Dyslipidemia and Former Smoker.  Sonographer:    Wenda Low Referring Phys: [id number] Jackson T TU  Sonographer Comments: Technically difficult study due to poor echo windows. Image acquisition challenging due to respiratory motion. IMPRESSIONS  1. Left ventricular ejection fraction, by estimation, is 55 to 60%. The left ventricle has normal function. The left ventricle has no regional wall motion abnormalities. Left ventricular diastolic parameters are indeterminate. There is the interventricular septum is flattened in systole, suggestive of right ventricular pressure overload.  2. Right ventricular systolic function is mildly reduced. The right ventricular size is moderately enlarged. There is normal pulmonary artery systolic pressure. The estimated right ventricular systolic pressure is [id number] mmHg.  3. Left atrial size was mildly dilated.  4. Right atrial size was moderately dilated.  5. The mitral valve is grossly normal. Trivial mitral valve regurgitation. No evidence of mitral stenosis.  6. The aortic valve is tricuspid. There is mild calcification of the aortic valve. There is mild thickening of the aortic valve. Aortic valve regurgitation is not visualized. No aortic stenosis is present.  7. The inferior vena cava is normal in size with greater than 50% respiratory variability, suggesting right atrial pressure of 3 mmHg. FINDINGS  Left Ventricle: Left ventricular ejection fraction, by estimation, is 55 to 60%. The left ventricle has normal function. The left ventricle has no regional wall motion  abnormalities. The left ventricular internal cavity size was normal in size. There is  no left ventricular hypertrophy. The interventricular septum is flattened in systole, consistent with right ventricular pressure overload. Left ventricular diastolic parameters are indeterminate. Right Ventricle: The right ventricular size is moderately enlarged. No increase in right ventricular wall thickness. Right ventricular systolic function is mildly reduced. There is normal pulmonary artery systolic pressure. The tricuspid regurgitant velocity is 2.62 m/s, and with an assumed right atrial pressure of 3 mmHg, the estimated right ventricular systolic pressure is 67.6 mmHg. Left Atrium: Left atrial size was mildly dilated. Right Atrium: Right atrial size was moderately dilated. Pericardium: There is no evidence of pericardial effusion. Mitral Valve: The mitral valve is grossly normal. Trivial mitral valve regurgitation. No evidence of mitral valve stenosis. MV peak gradient, 2.8 mmHg. The mean mitral valve gradient is 1.0 mmHg. Tricuspid Valve: The tricuspid valve is normal in structure. Tricuspid valve regurgitation is mild . No evidence of tricuspid stenosis. Aortic Valve: The aortic valve is tricuspid. There is mild calcification of the aortic valve. There is mild thickening of the aortic valve. Aortic valve regurgitation is not visualized. No aortic stenosis is present. Aortic valve mean gradient measures 1.0 mmHg. Aortic valve peak gradient measures 2.7 mmHg. Aortic valve area, by VTI measures 2.78 cm. Pulmonic Valve: The pulmonic valve was normal in structure. Pulmonic valve regurgitation is not visualized. No evidence of pulmonic stenosis. Aorta: The aortic root is normal in size and structure. Ascending aorta measurements are within normal limits for age when indexed to body surface area. Venous: The inferior vena cava is normal in size with greater than 50% respiratory variability, suggesting right atrial pressure of  3 mmHg. IAS/Shunts: No atrial level shunt detected by color flow Doppler.  LEFT VENTRICLE PLAX 2D LVIDd:         4.60 cm LVIDs:         3.30 cm LV PW:         0.90 cm LV IVS:        1.00 cm LVOT diam:     2.00 cm LV SV:         41 LV SV Index:   18 LVOT Area:     3.14 cm  RIGHT VENTRICLE RV Basal diam:  5.00 cm RV Mid diam:    4.20 cm RV S prime:     10.20 cm/s TAPSE (M-mode): 1.8 cm LEFT ATRIUM             Index        RIGHT ATRIUM           Index LA diam:        4.90 cm 2.16 cm/m   RA Area:     37.10 cm LA Vol (A2C):   81.3 ml 35.92 ml/m  RA Volume:   141.00 ml 62.30  ml/m LA Vol (A4C):   93.8 ml 41.44 ml/m LA Biplane Vol: 88.1 ml 38.93 ml/m  AORTIC VALVE                    PULMONIC VALVE AV Area (Vmax):    2.60 cm     PV Vmax:       0.93 m/s AV Area (Vmean):   2.55 cm     PV Peak grad:  3.4 mmHg AV Area (VTI):     2.78 cm AV Vmax:           82.00 cm/s AV Vmean:          50.100 cm/s AV VTI:            0.146 m AV Peak Grad:      2.7 mmHg AV Mean Grad:      1.0 mmHg LVOT Vmax:         67.90 cm/s LVOT Vmean:        40.700 cm/s LVOT VTI:          0.129 m LVOT/AV VTI ratio: 0.88  AORTA Ao Root diam: 3.70 cm MITRAL VALVE               TRICUSPID VALVE MV Area (PHT): 3.97 cm    TR Peak grad:   27.5 mmHg MV Area VTI:   1.90 cm    TR Vmax:        262.00 cm/s MV Peak grad:  2.8 mmHg MV Mean grad:  1.0 mmHg    SHUNTS MV Vmax:       0.83 m/s    Systemic VTI:  0.13 m MV Vmean:      43.1 cm/s   Systemic Diam: 2.00 cm MV Decel Time: 191 msec MV E velocity: 85.90 cm/s Cherlynn Kaiser MD Electronically signed by Cherlynn Kaiser MD Signature Date/Time: 12/08/2022/2:15:48 PM    Final    CT CHEST WO CONTRAST  Result Date: 12/08/2022 CLINICAL DATA:  Pneumonia. Complication suspected. * Tracking Code: BO * EXAM: CT CHEST WITHOUT CONTRAST TECHNIQUE: Multidetector CT imaging of the chest was performed following the standard protocol without IV contrast. RADIATION DOSE REDUCTION: This exam was performed according to the  departmental dose-optimization program which includes automated exposure control, adjustment of the mA and/or kV according to patient size and/or use of iterative reconstruction technique. COMPARISON:  11/03/2022 CT.  Plain film of 1 day prior FINDINGS: Cardiovascular: Aortic atherosclerosis. Tortuous thoracic aorta. Mild cardiomegaly, with trace anterior pericardial fluid or thickening. Left main and 3 vessel coronary artery calcification. Mediastinum/Nodes: Multiple small middle mediastinal nodes are similar, none pathologic by size criteria. Hilar regions poorly evaluated without intravenous contrast. Fluid or debris level within the esophagus on 75/3. Lungs/Pleura: No pleural fluid.  Mild centrilobular emphysema. Irregular right upper lobe pulmonary nodule measures 2.0 x 1.4 cm on 56/4 versus similar on the prior exam when measured in a similar fashion. This is contiguous with an area of more linear, bandlike opacity which is also unchanged. Relatively linear posterior left upper lobe density of maximally 1.8 cm on 66/4 is similar to on the prior exam (when remeasured). Presumed subpleural lymph node about the posterior left major fissure at 7 mm on 91/4 is unchanged. No evidence of pneumonia. Upper Abdomen: Normal imaged portions of the liver, spleen, stomach, left kidney. Minimal nodularity within the left adrenal gland. A right adrenal 1.4 cm nodule measures 6.2 HU, consistent with an adenoma. Musculoskeletal: Remote right clavicular fracture. Remote posttraumatic  deformity of the L1 vertebral body. IMPRESSION: 1. No evidence of pneumonia. 2. No significant change from 11/03/2022 chest CT. 3. Similar right upper lobe pulmonary nodule which had enlarged on that exam and is suspicious for primary bronchogenic carcinoma. Smaller left-sided pulmonary nodules are unchanged. 4. Aortic atherosclerosis (ICD10-I70.0), coronary artery atherosclerosis and emphysema (ICD10-J43.9). 5. Right adrenal adenoma . In the  absence of clinically indicated signs/symptoms require(s) no independent follow-up. 6. Esophageal air fluid level suggests dysmotility or gastroesophageal reflux. Electronically Signed   By: Abigail Miyamoto M.D.   On: 12/08/2022 09:51   DG Chest Port 1 View  Result Date: 12/07/2022 CLINICAL DATA:  Shortness of breath EXAM: PORTABLE CHEST 1 VIEW COMPARISON:  Chest x-ray 08/21/2019 FINDINGS: The heart size and mediastinal contours are within normal limits. There is a focal opacity in the right upper lobe which is slightly linear. The lungs are otherwise clear. There is no pleural effusion or pneumothorax. The visualized skeletal structures are unremarkable. IMPRESSION: Focal opacity in the right upper lobe which is slightly linear. This could be due to atelectasis or scarring. Recommend follow-up chest x-ray in 4-6 weeks to ensure resolution. Electronically Signed   By: Ronney Asters M.D.   On: 12/07/2022 22:07       The results of significant diagnostics from this hospitalization (including imaging, microbiology, ancillary and laboratory) are listed below for reference.     Microbiology: Recent Results (from the past 240 hour(s))  Resp panel by RT-PCR (RSV, Flu A&B, Covid) Anterior Nasal Swab     Status: None   Collection Time: 12/08/22  1:08 AM   Specimen: Anterior Nasal Swab  Result Value Ref Range Status   SARS Coronavirus 2 by RT PCR NEGATIVE NEGATIVE Final   Influenza A by PCR NEGATIVE NEGATIVE Final   Influenza B by PCR NEGATIVE NEGATIVE Final    Comment: (NOTE) The Xpert Xpress SARS-CoV-2/FLU/RSV plus assay is intended as an aid in the diagnosis of influenza from Nasopharyngeal swab specimens and should not be used as a sole basis for treatment. Nasal washings and aspirates are unacceptable for Xpert Xpress SARS-CoV-2/FLU/RSV testing.  Fact Sheet for Patients: EntrepreneurPulse.com.au  Fact Sheet for Healthcare  Providers: IncredibleEmployment.be  This test is not yet approved or cleared by the [place] FDA and has been authorized for detection and/or diagnosis of SARS-CoV-2 by FDA under an Emergency Use Authorization (EUA). This EUA will remain in effect (meaning this test can be used) for the duration of the COVID-19 declaration under Section 564(b)(1) of the Act, 21 U.S.C. section 360bbb-3(b)(1), unless the authorization is terminated or revoked.     Resp Syncytial Virus by PCR NEGATIVE NEGATIVE Final    Comment: (NOTE) Fact Sheet for Patients: EntrepreneurPulse.com.au  Fact Sheet for Healthcare Providers: IncredibleEmployment.be  This test is not yet approved or cleared by the [place] FDA and has been authorized for detection and/or diagnosis of SARS-CoV-2 by FDA under an Emergency Use Authorization (EUA). This EUA will remain in effect (meaning this test can be used) for the duration of the COVID-19 declaration under Section 564(b)(1) of the Act, 21 U.S.C. section 360bbb-3(b)(1), unless the authorization is terminated or revoked.  Performed at [facility], [place] [address], [place], [place] [address]   Urine Culture     Status: None   Collection Time: 12/10/22  6:10 PM   Specimen: Urine, Random  Result Value Ref Range Status   Specimen Description URINE, RANDOM  Final   Special Requests URINE, CLEAN CATCH  Final   Culture   Final    NO GROWTH Performed at [facility], [place] [address], [place], [place] [address]    Report Status 12/12/2022 FINAL  Final     Labs:  CBC: Recent Labs  Lab 12/07/22 2130 12/08/22 0156 12/09/22 0133 12/10/22 0254 12/11/22 0104 12/12/22 0223  WBC 8.8 10.5 11.4* 10.8* 10.6* 10.4  NEUTROABS 5.7  --  7.4 5.8 6.5  --   HGB 15.2 14.2 16.0 15.1 14.7 14.9  HCT 46.3 43.1 47.9 46.2 44.2 45.0  MCV 88.5 87.6 87.9 87.0 85.7 85.7  PLT 332 327 375 378 365 333   BMP  &GFR Recent Labs  Lab 12/09/22 0133 12/10/22 0254 12/11/22 0104 12/12/22 0223 12/13/22 0237  NA 130* 125* 126* 130* 134*  K 4.4 4.1 4.1 3.8 4.1  CL 96* 95* 95* 103 102  CO2 22 18* 20* 18* 19*  GLUCOSE 171* 183* 211* 178* 91  BUN 30* 47* 69* 60* 53*  CREATININE 2.57* 3.37* 3.45* 2.39* 1.99*  CALCIUM 8.8* 8.7* 8.5* 8.7* 8.8*  MG 2.2  --   --  2.4 2.1  PHOS  --   --   --  4.7* 4.2   Estimated Creatinine Clearance: 35 mL/min (A) (by C-G formula based on SCr of 1.99 mg/dL (H)). Liver & Pancreas: Recent Labs  Lab 12/11/22 0104 12/12/22 0223 12/13/22 0237  AST 92* 68* 72*  ALT 65* 72* 78*  ALKPHOS 132* 134* 129*  BILITOT 1.0 0.5 0.6  PROT 7.6 7.3 7.4  ALBUMIN 2.7* 2.7* 2.7*   No results for input(s): "LIPASE", "AMYLASE" in the last 168 hours. No results for input(s): "AMMONIA" in the last 168 hours. Diabetic: No results for input(s): "HGBA1C" in the last 72 hours. Recent Labs  Lab 12/12/22 0902 12/12/22 1132 12/12/22 1809 12/12/22 2052 12/13/22 0758  GLUCAP 216* 367* 135* 158* 166*   Cardiac Enzymes: Recent Labs  Lab 12/13/22 0237  CKTOTAL 42*   No results for input(s): "PROBNP" in the last 8760 hours. Coagulation Profile: No results for input(s): "INR", "PROTIME" in the last 168 hours. Thyroid Function Tests: No results for input(s): "TSH", "T4TOTAL", "FREET4", "T3FREE", "THYROIDAB" in the last 72 hours. Lipid Profile: Recent Labs    12/12/22 0223  CHOL 94  HDL 28*  LDLCALC 43  TRIG 113  CHOLHDL 3.4   Anemia Panel: No results for input(s): "VITAMINB12", "FOLATE", "FERRITIN", "TIBC", "IRON", "RETICCTPCT" in the last 72 hours. Urine analysis:    Component Value Date/Time   COLORURINE YELLOW 12/10/2022 1810   APPEARANCEUR TURBID (A) 12/10/2022 1810   LABSPEC 1.012 12/10/2022 1810   PHURINE 5.0 12/10/2022 1810   GLUCOSEU 150 (A) 12/10/2022 1810   HGBUR SMALL (A) 12/10/2022 1810   BILIRUBINUR NEGATIVE 12/10/2022 1810   KETONESUR NEGATIVE 12/10/2022  1810   PROTEINUR 100 (A) 12/10/2022 1810   NITRITE NEGATIVE 12/10/2022 1810   LEUKOCYTESUR MODERATE (A) 12/10/2022 1810   Sepsis Labs: Invalid input(s): "PROCALCITONIN", "LACTICIDVEN"   SIGNED:  Mercy Riding, MD  [facility] 12/13/2022, 5:39 PM

## 2022-12-13 NOTE — Care Management Important Message (Signed)
Important Message  Patient Details  Name: Kevin [name] MRN: [id number] Date of Birth: [birth year]   Medicare Important Message Given:  Yes     [name] 12/13/2022, 9:30 AM

## 2022-12-13 NOTE — Progress Notes (Signed)
Progress Note  Patient Name: Kevin [name] Date of Encounter: 12/13/2022  Primary Cardiologist: Lauree Chandler, MD   Subjective   Renal function continues to improve (3.45>2.39>1.99).  BP 104/61.  Denies any chest pain or dyspnea  Inpatient Medications    Scheduled Meds:  apixaban  5 mg Oral BID   Chlorhexidine Gluconate Cloth  6 each Topical Daily   diltiazem  120 mg Oral Daily   doxycycline  100 mg Oral Q12H   fluconazole  200 mg Oral Daily   insulin aspart  0-15 Units Subcutaneous TID PC & HS   insulin aspart  7 Units Subcutaneous TID WC   insulin glargine-yfgn  25 Units Subcutaneous BID   leptospermum manuka honey  1 Application Topical Daily   mesalamine  1.2 g Oral BID   metoprolol tartrate  50 mg Oral BID   oxybutynin  5 mg Oral QHS   rosuvastatin  5 mg Oral Daily   tamsulosin  0.4 mg Oral q12n4p   trimethoprim  100 mg Oral QHS   umeclidinium-vilanterol  1 puff Inhalation Daily   Continuous Infusions:  cefTRIAXone (ROCEPHIN)  IV 2 g (12/12/22 1515)   PRN Meds: ipratropium-albuterol, lidocaine, melatonin, polyethylene glycol, senna-docusate   Vital Signs    Vitals:   12/12/22 2048 12/13/22 0457 12/13/22 0751 12/13/22 0800  BP: 112/73 103/68  104/61  Pulse: (!) 55 67  60  Resp: 18 16  19   Temp: 98 F ([id number] C) 97.8 F (36.6 C)  97.8 F (36.6 C)  TempSrc: Oral Oral  Oral  SpO2: 94% 95% 97% 92%  Weight:  98.4 kg    Height:        Intake/Output Summary (Last 24 hours) at 12/13/2022 0919 Last data filed at 12/13/2022 0502 Gross per 24 hour  Intake 240 ml  Output 3500 ml  Net -3260 ml    Filed Weights   12/11/22 0452 12/12/22 0434 12/13/22 0457  Weight: 97.8 kg 93.3 kg 98.4 kg    Telemetry    Atrial fibrillation with rates 60-70s Personally Reviewed  ECG    None today- Personally Reviewed  Physical Exam    General: Comfortable Head: Atraumatic, normal size  Eyes: PEERLA, EOMI  Neck: Supple, normal JVD Cardiac: Normal S1, S2;  IRRR; no murmurs, rubs, or gallops Lungs: Clear to auscultation bilaterally Abd: Soft, nontender, no hepatomegaly  Ext: warm, no edema Musculoskeletal: No deformities, BUE and BLE strength normal and equal Skin: Warm and dry, no rashes   Neuro: Alert and oriented to person, place, time, and situation, CNII-XII grossly intact, no focal deficits  Psych: Normal mood and affect   Labs    Chemistry Recent Labs  Lab 12/11/22 0104 12/12/22 0223 12/13/22 0237  NA 126* 130* 134*  K 4.1 3.8 4.1  CL 95* 103 102  CO2 20* 18* 19*  GLUCOSE 211* 178* 91  BUN 69* 60* 53*  CREATININE 3.45* 2.39* 1.99*  CALCIUM 8.5* 8.7* 8.8*  PROT 7.6 7.3 7.4  ALBUMIN 2.7* 2.7* 2.7*  AST 92* 68* 72*  ALT 65* 72* 78*  ALKPHOS 132* 134* 129*  BILITOT 1.0 0.5 0.6  GFRNONAA 17* 27* 34*  ANIONGAP 11 9 13       Hematology Recent Labs  Lab 12/10/22 0254 12/11/22 0104 12/12/22 0223  WBC 10.8* 10.6* 10.4  RBC 5.31 5.16 5.25  HGB 15.1 14.7 14.9  HCT 46.2 44.2 45.0  MCV 87.0 85.7 85.7  MCH 28.4 28.5 28.4  MCHC 32.7 33.3 33.1  RDW 14.7 14.5 14.6  PLT 378 365 333     Cardiac EnzymesNo results for input(s): "TROPONINI" in the last 168 hours. No results for input(s): "TROPIPOC" in the last 168 hours.   BNP Recent Labs  Lab 12/07/22 2130  BNP 475.5*      DDimer No results for input(s): "DDIMER" in the last 168 hours.   Radiology    CT RENAL STONE STUDY  Result Date: 12/11/2022 CLINICAL DATA:  Severe hydronephrosis. EXAM: CT ABDOMEN AND PELVIS WITHOUT CONTRAST TECHNIQUE: Multidetector CT imaging of the abdomen and pelvis was performed following the standard protocol without IV contrast. RADIATION DOSE REDUCTION: This exam was performed according to the departmental dose-optimization program which includes automated exposure control, adjustment of the mA and/or kV according to patient size and/or use of iterative reconstruction technique. COMPARISON:  01/05/2021. FINDINGS: Lower chest: Coronary  artery calcifications are noted. Atelectasis and fibrotic changes are noted at the lung bases. Hepatobiliary: Hypodensity is noted in the left lobe of the liver, most likely cyst or hemangioma. No biliary ductal dilatation. The gallbladder is without stones. Pancreas: Unremarkable. No pancreatic ductal dilatation or surrounding inflammatory changes. Spleen: Normal in size without focal abnormality. Adrenals/Urinary Tract: There is a 1.8 cm nodule in the right adrenal gland with attenuation of 8 Hounsfield units, compatible with adenoma. The left adrenal gland is within normal limits. No renal or ureteral calculus bilaterally. There is moderate to severe hydroureteronephrosis with thickening of the walls of the urothelium. Perinephric fat stranding is noted bilaterally. There is diffuse bladder wall thickening. A Foley catheter is present in the urinary bladder. Stomach/Bowel: Stomach is within normal limits. Appendix appears normal. No evidence of bowel wall thickening, distention, or inflammatory changes. No free air or pneumatosis. Vascular/Lymphatic: Aortic atherosclerosis. No enlarged abdominal or pelvic lymph nodes. Reproductive: The prostate gland is enlarged. Other: No abdominopelvic ascites. A fat containing umbilical hernia is present. Musculoskeletal: Degenerative changes are present in the thoracolumbar spine. There is a stable compression deformity at L1. No acute osseous abnormality. IMPRESSION: 1. Moderate-to-severe hydroureteronephrosis bilaterally with no evidence of obstructing calculus. Urothelial thickening is noted bilaterally in the possibility of superimposed infection can not be excluded. 2. Diffuse bladder wall thickening with Foley catheter in place, compatible with cystitis. 3. Enlarged prostate gland. 4. Right adrenal adenoma. 5. Aortic atherosclerosis and coronary artery calcifications Electronically Signed   By: Brett Fairy M.D.   On: 12/11/2022 20:33    Cardiac Studies   TTE  12/08/2022 IMPRESSIONS     1. Left ventricular ejection fraction, by estimation, is 55 to 60%. The  left ventricle has normal function. The left ventricle has no regional  wall motion abnormalities. Left ventricular diastolic parameters are  indeterminate. There is the  interventricular septum is flattened in systole, suggestive of right  ventricular pressure overload.   2. Right ventricular systolic function is mildly reduced. The right  ventricular size is moderately enlarged. There is normal pulmonary artery  systolic pressure. The estimated right ventricular systolic pressure is  [id number] mmHg.   3. Left atrial size was mildly dilated.   4. Right atrial size was moderately dilated.   5. The mitral valve is grossly normal. Trivial mitral valve  regurgitation. No evidence of mitral stenosis.   6. The aortic valve is tricuspid. There is mild calcification of the  aortic valve. There is mild thickening of the aortic valve. Aortic valve  regurgitation is not visualized. No aortic stenosis is present.   7. The inferior vena cava is  normal in size with greater than 50%  respiratory variability, suggesting right atrial pressure of 3 mmHg.   FINDINGS   Left Ventricle: Left ventricular ejection fraction, by estimation, is 55  to 60%. The left ventricle has normal function. The left ventricle has no  regional wall motion abnormalities. The left ventricular internal cavity  size was normal in size. There is   no left ventricular hypertrophy. The interventricular septum is flattened  in systole, consistent with right ventricular pressure overload. Left  ventricular diastolic parameters are indeterminate.   Right Ventricle: The right ventricular size is moderately enlarged. No  increase in right ventricular wall thickness. Right ventricular systolic  function is mildly reduced. There is normal pulmonary artery systolic  pressure. The tricuspid regurgitant  velocity is 2.62 m/s, and with an  assumed right atrial pressure of 3 mmHg,  the estimated right ventricular systolic pressure is [id number] mmHg.   Left Atrium: Left atrial size was mildly dilated.   Right Atrium: Right atrial size was moderately dilated.   Pericardium: There is no evidence of pericardial effusion.   Mitral Valve: The mitral valve is grossly normal. Trivial mitral valve  regurgitation. No evidence of mitral valve stenosis. MV peak gradient, 2.8  mmHg. The mean mitral valve gradient is 1.0 mmHg.   Tricuspid Valve: The tricuspid valve is normal in structure. Tricuspid  valve regurgitation is mild . No evidence of tricuspid stenosis.   Aortic Valve: The aortic valve is tricuspid. There is mild calcification  of the aortic valve. There is mild thickening of the aortic valve. Aortic  valve regurgitation is not visualized. No aortic stenosis is present.  Aortic valve mean gradient measures  1.0 mmHg. Aortic valve peak gradient measures 2.7 mmHg. Aortic valve area,  by VTI measures 2.78 cm.   Pulmonic Valve: The pulmonic valve was normal in structure. Pulmonic valve  regurgitation is not visualized. No evidence of pulmonic stenosis.   Aorta: The aortic root is normal in size and structure. Ascending aorta  measurements are within normal limits for age when indexed to body surface  area.   Venous: The inferior vena cava is normal in size with greater than 50%  respiratory variability, suggesting right atrial pressure of 3 mmHg.   IAS/Shunts: No atrial level shunt detected by color flow Doppler.   Patient Profile     82 y.o. male with history of heart failure with preserved ejection fraction, paroxysmal atrial fibrillation, diabetes, CKD stage IIIb, hyperlipidemia, COPD, left transmetatarsal amputation presenting and admitted for heart failure with preserved ejection fraction and acute exacerbation of COPD with acute respiratory failure with hypoxia.   Assessment & Plan    Acute on chronic diastolic heart  failure: Diuresed earlier this admission but developed worsening AKI.  Now euvolemic.  Lasix on hold.  Echo this admission showed EF 55 to [id number], mild RV systolic dysfunction, moderate RV enlargement, moderate right atrial enlargement, mild left atrial enlargement, no significant valvular disease. -Would continue to hold lasix on discharge.  Recommend monitoring daily weights and can take Lasix 20 mg as needed if gains more than 3 pounds in 1 day or 5 pounds in 1 week  Atrial fibrillation: Status post DCCV 12/02/2022, now back in A-fib -Continue metoprolol 50 mg BID and diltiazem 120 mg daily for rate control.  Rates appear well controlled -Continue Eliquis -Will schedule follow-up in A-fib clinic on discharge.  Can consider repeat cardioversion once recovered from acute illness  AKI: Due to obstructive uropathy, now status post Foley  catheter.  Creatinine peaked at 3.45, improved to 1.99 today  Acute on chronic respiratory failure with hypoxia: Likely multifactorial with COPD and heart failure contributing.       For questions or updates, please contact [facility] Please consult [email or web address] for contact info under Cardiology/STEMI.      Signed, Donato Heinz, MD  12/13/2022, 9:19 AM

## 2022-12-13 NOTE — Inpatient Diabetes Management (Addendum)
Inpatient Diabetes Program Recommendations  AACE/ADA: New Consensus Statement on Inpatient Glycemic Control (2015)  Target Ranges:  Prepandial:   less than 140 mg/dL      Peak postprandial:   less than 180 mg/dL (1-2 hours)      Critically ill patients:  140 - 180 mg/dL   Lab Results  Component Value Date   GLUCAP 166 (H) 12/13/2022   HGBA1C 10.2 (H) 12/08/2022    Review of Glycemic Control  Latest Reference Range & Units 12/11/22 08:02 12/11/22 11:42 12/11/22 16:41 12/11/22 21:26 12/12/22 09:02 12/12/22 11:32 12/12/22 18:09 12/12/22 20:52 12/13/22 07:58  Glucose-Capillary 70 - 99 mg/dL 261 (H) 262 (H) 224 (H) 253 (H) 216 (H) 367 (H) 135 (H) 158 (H) 166 (H)   Diabetes history: DM 2 Outpatient Diabetes medications: : Lantus Solostar insulin pen 15-32 units BID (no scale to follow), Jardiance 10 mg daily Current orders for Inpatient glycemic control: Semglee 25 units BID, Novolog 0-15 units correction scale TID & HS, Novolog 7 units tid with meals (meal coverage)   Inpatient Diabetes Program Recommendations:    Note patient to be discharged.  CGM placed per MD order on Friday December 10, 2022.  Patient to follow-up with Dr. Leeroy Bock.   Recommendations for discharge   -Lantus 22 units bid  -Novolog 7 units bid with meal (only take with meals)- Order number [id number] -Novolog sensitive correction with meals bid-Check sugar and inject per scale: BG <150= 0 unit; BG 150-200= 1 unit; BG 201-250= 3 unit; BG 251-300= 5 unit; BG 301-350= 7 unit; BG 351-400= 9 unit; BG >400= 11 unit and Call Provider.  Needs to follow-up with endocrinology ASAP.   Thanks,  Adah Perl, RN, BC-ADM Inpatient Diabetes Coordinator Pager [phone]  (8a-5p)

## 2022-12-13 NOTE — Plan of Care (Signed)

## 2022-12-16 ENCOUNTER — Ambulatory Visit (INDEPENDENT_AMBULATORY_CARE_PROVIDER_SITE_OTHER): Payer: Medicare Other | Admitting: Orthopedic Surgery

## 2022-12-16 DIAGNOSIS — Z89432 Acquired absence of left foot: Secondary | ICD-10-CM | POA: Diagnosis not present

## 2022-12-16 DIAGNOSIS — L97521 Non-pressure chronic ulcer of other part of left foot limited to breakdown of skin: Secondary | ICD-10-CM | POA: Diagnosis not present

## 2022-12-17 ENCOUNTER — Encounter: Payer: Self-pay | Admitting: Orthopedic Surgery

## 2022-12-17 ENCOUNTER — Ambulatory Visit (INDEPENDENT_AMBULATORY_CARE_PROVIDER_SITE_OTHER): Payer: Medicare Other | Admitting: Emergency Medicine

## 2022-12-17 ENCOUNTER — Encounter: Payer: Self-pay | Admitting: Emergency Medicine

## 2022-12-17 VITALS — BP 116/60 | HR 67 | Ht 74.0 in | Wt 224.4 lb

## 2022-12-17 DIAGNOSIS — Z01818 Encounter for other preprocedural examination: Secondary | ICD-10-CM | POA: Diagnosis not present

## 2022-12-17 DIAGNOSIS — J449 Chronic obstructive pulmonary disease, unspecified: Secondary | ICD-10-CM | POA: Diagnosis not present

## 2022-12-17 DIAGNOSIS — R918 Other nonspecific abnormal finding of lung field: Secondary | ICD-10-CM

## 2022-12-17 NOTE — Progress Notes (Signed)
Office Visit Note   Patient: Kevin [name]           Date of Birth: [birth year]           MRN: [id number] Visit Date: 12/16/2022              Requested by: [name], MD [address] [place],  [place] [address] PCP: [name], MD  Chief Complaint  Patient presents with   Left Foot - Wound Check      HPI: Patient is a 82 year old gentleman with a Wagner grade 1 ulcer plantar aspect left transmetatarsal amputation.  Patient states he went to the emergency department last week was admitted with heart and pulmonary issues.  MRI scan was obtained of the left foot.  Assessment & Plan: Visit Diagnoses:  1. Non-pressure chronic ulcer of other part of left foot limited to breakdown of skin ([place])   2. History of transmetatarsal amputation of left foot (HCC)     Plan: Ulcer was debrided the tissue was healthy with good granulation tissue there is no tunneling there is no cellulitis no signs of any deep infection.  Will continue to follow this conservatively.  Follow-Up Instructions: Return in about 4 weeks (around 01/13/2023).   Ortho Exam  Patient is alert, oriented, no adenopathy, well-dressed, normal affect, normal respiratory effort. Examination patient has a palpable pulse.  He has a Wagner grade 1 ulcer beneath the medial column of the transmetatarsal amputation.  After informed consent a 10 blade knife was used to debride the skin and soft tissue back to healthy viable granulation tissue.  The wound is 1 cm diameter 1 mm deep after debridement with healthy granulation tissue.  Patient has no redness no cellulitis no swelling no drainage.  Review of the MRI scan shows edema within the medial cuneiform consistent with osteomyelitis however patient's clinical picture shows no clinical signs of infection.  Imaging: No results found. No images are attached to the encounter.  Labs: Lab Results  Component Value Date   HGBA1C 10.2 (H) 12/08/2022    HGBA1C 8.7 (H) 08/20/2019   HGBA1C 10.6 (H) 12/17/2018   ESRSEDRATE 28 (H) 12/12/2022   CRP 3.7 (H) 12/12/2022   CRP 0.8 08/24/2019   CRP <0.8 08/23/2019   REPTSTATUS 12/12/2022 FINAL 12/10/2022   GRAMSTAIN  03/17/2018    MODERATE WBC PRESENT, PREDOMINANTLY PMN MODERATE GRAM POSITIVE COCCI FEW GRAM NEGATIVE RODS    CULT  12/10/2022    NO GROWTH Performed at [facility], [place] [address], [place], [place] [address]    Doy Hutching ENTEROCOCCUS FAECALIS (A) 08/20/2019     Lab Results  Component Value Date   ALBUMIN 2.7 (L) 12/13/2022   ALBUMIN 2.7 (L) 12/12/2022   ALBUMIN 2.7 (L) 12/11/2022    Lab Results  Component Value Date   MG 2.1 12/13/2022   MG 2.4 12/12/2022   MG 2.2 12/09/2022   No results found for: "VD25OH"  No results found for: "PREALBUMIN"    Latest Ref Rng & Units 12/12/2022    2:23 AM 12/11/2022    1:04 AM 12/10/2022    2:54 AM  CBC EXTENDED  WBC 4.0 - 10.5 K/uL 10.4  10.6  10.8   RBC 4.22 - 5.81 MIL/uL 5.25  5.16  5.31   Hemoglobin 13.0 - 17.0 g/dL 14.9  14.7  15.1   HCT 39.0 - 52.0 % 45.0  44.2  46.2   Platelets 150 - 400 K/uL 333  365  378   NEUT# 1.7 - 7.7 K/uL  6.5  5.8   Lymph# 0.7 - 4.0 K/uL  2.6  3.1      There is no height or weight on file to calculate BMI.  Orders:  No orders of the defined types were placed in this encounter.  No orders of the defined types were placed in this encounter.    Procedures: No procedures performed  Clinical Data: No additional findings.  ROS:  All other systems negative, except as noted in the HPI. Review of Systems  Objective: Vital Signs: There were no vitals taken for this visit.  Specialty Comments:  No specialty comments available.  PMFS History: Patient Active Problem List   Diagnosis Date Noted   Bilateral hydronephrosis 12/11/2022   CAD (coronary artery disease) 12/09/2022   Mass of upper lobe of right lung 12/08/2022   Acute on chronic respiratory failure with hypoxia ([place])  12/08/2022   History of recurrent UTI (urinary tract infection) 12/08/2022   DNR (do not resuscitate) 12/08/2022   Hypoxia 12/08/2022   Shortness of breath 12/08/2022   History of amputation of left foot ([place]) 06/09/2022   Cough 05/18/2022   Pulmonary nodules/lesions, multiple 01/08/2021   COPD (chronic obstructive pulmonary disease) ([place]) 06/26/2020   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   Chronic kidney disease, stage 3b ([place]) 08/20/2019   Chronic respiratory failure ([place]) 08/20/2019   Pneumonia due to COVID-19 virus 08/19/2019   UTI (urinary tract infection) 12/15/2018   Acute on chronic diastolic CHF (congestive heart failure) ([place]) 12/15/2018   HLD (hyperlipidemia) 12/15/2018   Dehiscence of amputation stump ([place])    Renal insufficiency 05/06/2018   Insulin dependent type 2 diabetes mellitus ([place]) 05/06/2018   Debility 03/31/2018   Urinary retention with incomplete bladder emptying 03/31/2018   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 (HCC)    Diabetic foot ulcer ([place])    Status post transmetatarsal amputation of left foot (HCC)    Postoperative pain    Benign prostatic hyperplasia with urinary retention    New onset atrial fibrillation (HCC)    Incontinence of feces    Cerebrovascular accident (CVA) ([place])    Benign essential HTN    Diabetes mellitus type 2 in nonobese ([place])    Hypokalemia    PAF (paroxysmal atrial fibrillation) (HCC)    Leukocytosis    SIRS (systemic inflammatory response syndrome) ([place])    AKI (acute kidney injury) ([place])    Essential hypertension 03/20/2018   Tachycardia 03/20/2018   Sepsis ([place]) 03/20/2018   Diabetes ([place]) 03/20/2018   Osteomyelitis ([place]) 03/20/2018   A-fib ([place]) 03/20/2018   Stroke-like episode ([place]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 03/16/2018   Strain of left tibialis anterior muscle 12/19/2015   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   PAF (paroxysmal atrial fibrillation) ([place])    a. dx 03/2018 in setting of stroke, osteomyelitis.   Stage 3 chronic kidney disease ([place])    pt/family unaware  Status post transmetatarsal amputation of left foot ([place])     Family History  Problem Relation Age of Onset   Hypertension Father     Past Surgical History:  Procedure Laterality Date   AMPUTATION Left 03/24/2018   Procedure: Transmetatarsal Amputation Left Foot;  Surgeon: [name], MD;  Location: [facility];  Service: Orthopedics;  Laterality: Left;   CARDIOVERSION N/A 05/05/2018   Procedure: CARDIOVERSION;  Surgeon: [name], MD;  Location: [facility];  Service: Cardiovascular;  Laterality: N/A;   CARDIOVERSION N/A 12/02/2022   Procedure: CARDIOVERSION;  Surgeon: Buford Dresser, MD;  Location: [facility] ENDOSCOPY;  Service: Cardiovascular;  Laterality: N/A;   COLONOSCOPY  12/2017   benign, remission from ulcerative colitis in 90s   I & D EXTREMITY Left 03/17/2018   Procedure: [place];  Surgeon: [name], MD;  Location: [facility];  Service: Orthopedics;  Laterality: Left;   STUMP REVISION Left 05/10/2018   Procedure: REVISION LEFT TRANSMETATARSAL AMPUTATION;  Surgeon: [name], MD;  Location: [facility];  Service: Orthopedics;  Laterality: Left;   Social History   Occupational History   Not on file  Tobacco Use   Smoking status: Former    Packs/day: 1.00    Years: 20.00    Additional pack years: 0.00    Total pack years: 20.00    Types: Cigarettes    Start date: 1963     Quit date: 1985    Years since quitting: 39.2   Smokeless tobacco: Never  Vaping Use   Vaping Use: Never used  Substance and Sexual Activity   Alcohol use: Yes    Alcohol/week: 4.0 standard drinks of alcohol    Types: 4 Cans of beer per week    Comment: social    Drug use: Never   Sexual activity: Not on file

## 2022-12-17 NOTE — Assessment & Plan Note (Signed)
Slowly enlarging right upper lobe 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  His other nodules are stable in size and appearance.  We we will plan to perform navigational bronchoscopy.  This is actually a good time to do so since he is better compensated from his COPD and his atrial fibrillation.  Will need to stop his Eliquis for 2 days.  He is going to see cardiology on 12/27/2022.  We will tentatively schedule navigational bronchoscopy under general anesthesia for 01/03/2023.  We reviewed your CT scans of the chest today. We will arrange for navigational bronchoscopy to evaluate a right upper lobe pulmonary nodule.  This will be done as an outpatient under general anesthesia at [facility] endoscopy.  You will need to stop aspirin and Eliquis 2 days prior.  You will need a designated driver.  We will try to get this arranged for 01/03/2023. Follow Dr. Lamonte Sakai in 1 month or next available

## 2022-12-17 NOTE — Addendum Note (Signed)
Addended by: Loma Sousa on: 12/17/2022 04:17 PM   Modules accepted: Orders

## 2022-12-17 NOTE — Addendum Note (Signed)
Addended by: Loma Sousa on: 12/17/2022 01:59 PM   Modules accepted: Orders

## 2022-12-17 NOTE — H&P (View-Only) (Signed)
 Subjective:    Patient ID: Kevin [name], male    DOB: 11/04/1940, 82 y.o.   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  PMH also significant for hypertension, A-fib, CVA, CKD stage III, diabetes and ulcerative colitis.  He has a right upper lobe pulmonary nodule and a posterior left upper lobe pulmonary nodule that have been followed on serial imaging.  PET scan done in 01/01/2021 showed some mild hypermetabolism (SUV 2.9).  Most recent scan done on 04/28/2022 as below.  He has O2 > uses 3L/min with heavier exertion. He is on Stiolto. Occasional albuterol use, not every day, does benefit. Some wheeze at night. He has nasal gtt and obstruction, has associated cough. Not on an allergy regimen.   CT scan of the chest 04/28/2022 reviewed by me shows significant centrilobular emphysema, bilateral bronchial wall thickening.  There is a stable to slightly enlarged 1.8 x 1.2 cm spiculated right upper lobe nodule.  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  Also with a history of hypertension with diastolic CHF, CKD stage IIIb, atrial fibrillation with DCCV on 3/7 (Eliquis), diabetes with a left foot ulcer.  His most recent outpatient CT chest 04/28/2022 showed a persistent 1.8 x 1.2 cm spiculated right upper lobe nodule, 2.2 x 0.8 cm elongated left upper lobe nodule.  Currently managed on Stiolto, uses oxygen 3 L/min with heavy exertion.  He had a repeat CT chest 11/03/2022 as below, then underwent another CT chest 12/08/2022 while he was admitted for acute on chronic hypoxemic respiratory failure, atrial fibrillation with RVR.  CT chest 11/03/2022 reviewed by me shows a continued increase in size of his spiculated right upper lobe pulmonary nodule now measuring 2.0 x  1.3 x 2.1 cm.  His other pulmonary nodules are unchanged including the posterior left upper lobe, superior segment of the left lower lobe   Review of Systems As per HPI  Past Medical History:  Diagnosis Date   A-fib (HCC) 03/20/2018   Acute blood loss anemia    Benign prostatic hyperplasia with urinary retention    Cerebrovascular accident (CVA) (HCC)    Diabetes mellitus type 2 in nonobese (HCC)    Essential hypertension 03/20/2018   Former tobacco use    H/O ulcerative colitis 1993   Leukocytosis    Osteomyelitis (HCC)    a. 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   PAF (paroxysmal atrial fibrillation) (HCC)    a. dx 03/2018 in setting of stroke, osteomyelitis.   Stage 3 chronic kidney disease (HCC)    pt/family unaware   Status post transmetatarsal amputation of left foot (HCC)      Family History  Problem Relation Age of Onset   Hypertension Father     No hx lung cancer.   Social History   Socioeconomic History   Marital status: Married    Spouse name: Not on file   Number of children: Not on file   Years of education: Not on file   Highest education level: Bachelor's degree (e.g., BA, AB, BS)  Occupational History   Not on file  Tobacco Use   Smoking status: Former    Packs/day: 1.00    Years: 20.00    Additional pack years: 0.00    Total   pack years: 20.00    Types: Cigarettes    Start date: 1963    Quit date: 1985    Years since quitting: 39.2   Smokeless tobacco: Never  Vaping Use   Vaping Use: Never used  Substance and Sexual Activity   Alcohol use: Yes    Alcohol/week: 4.0 standard drinks of alcohol    Types: 4 Cans of beer per week    Comment: social    Drug use: Never   Sexual activity: Not on file  Other Topics Concern   Not on file  Social History Narrative   Lives at home with his wife and two cats. Married for almost 45 years   Right handed   Caffeine: 1 cup every morning   Social Determinants of Health   Financial Resource Strain: Not  on file  Food Insecurity: No Food Insecurity (12/08/2022)   Hunger Vital Sign    Worried About Running Out of Food in the Last Year: Never true    Ran Out of Food in the Last Year: Never true  Transportation Needs: No Transportation Needs (12/08/2022)   PRAPARE - Transportation    Lack of Transportation (Medical): No    Lack of Transportation (Non-Medical): No  Physical Activity: Not on file  Stress: Not on file  Social Connections: Not on file  Intimate Partner Violence: Not At Risk (12/08/2022)   Humiliation, Afraid, Rape, and Kick questionnaire    Fear of Current or Ex-Partner: No    Emotionally Abused: No    Physically Abused: No    Sexually Abused: No      No Known Allergies   Outpatient Medications Prior to Visit  Medication Sig Dispense Refill   albuterol (VENTOLIN HFA) 108 (90 Base) MCG/ACT inhaler Inhale 2 puffs into the lungs every 6 (six) hours as needed for wheezing or shortness of breath. 24 g 1   amoxicillin-clavulanate (AUGMENTIN) 875-125 MG tablet Take 1 tablet by mouth 2 (two) times daily for 10 days. 20 tablet 0   apixaban (ELIQUIS) 5 MG TABS tablet Take 1 tablet (5 mg total) by mouth 2 (two) times daily. 180 tablet 1   diltiazem (CARDIZEM CD) 120 MG 24 hr capsule Take 1 capsule (120 mg total) by mouth daily. Take at breakfast. 90 capsule 0   doxycycline (VIBRA-TABS) 100 MG tablet Take 1 tablet (100 mg total) by mouth every 12 (twelve) hours for 10 days. 20 tablet 0   escitalopram (LEXAPRO) 10 MG tablet Take 10 mg by mouth daily.     fluconazole (DIFLUCAN) 200 MG tablet Take 1 tablet (200 mg total) by mouth daily for 6 days. 6 tablet 0   insulin aspart protamine - aspart (NOVOLOG 70/30 MIX) (70-30) 100 UNIT/ML FlexPen Inject 27 Units into the skin 2 (two) times daily with a meal. 15 mL 11   Insulin Pen Needle (PEN NEEDLES 3/16") 31G X 5 MM MISC 1 Pen by Does not apply route 2 (two) times daily before a meal. Before breakfast and dinner 100 each 1   loratadine  (CLARITIN) 10 MG tablet Take 10 mg by mouth at bedtime.     mesalamine (LIALDA) 1.2 g EC tablet Take 1.2 g by mouth daily.     metoprolol succinate (TOPROL-XL) 50 MG 24 hr tablet Take 1 tablet (50 mg total) by mouth daily. Take with lunch. 90 tablet 3   Multiple Vitamins-Minerals (PRESERVISION AREDS 2 PO) Take 1 capsule by mouth 2 (two) times daily.     rosuvastatin (CRESTOR) 10   MG tablet Take 0.5 tablets (5 mg total) by mouth daily. 45 tablet 2   senna-docusate (SENOKOT-S) 8.6-50 MG tablet Take 2 tablets by mouth 2 (two) times daily as needed for moderate constipation.     tamsulosin (FLOMAX) 0.4 MG CAPS capsule Take 1 capsule (0.4 mg total) by mouth 2 times daily at 12 noon and 4 pm. (Patient taking differently: Take 0.4 mg by mouth in the morning and at bedtime.) 60 capsule 0   Tiotropium Bromide-Olodaterol (STIOLTO RESPIMAT) 2.5-2.5 MCG/ACT AERS Inhale 2 puffs into the lungs daily. 4 g 11   [START ON 12/20/2022] trimethoprim (TRIMPEX) 100 MG tablet Take 1 tablet (100 mg total) by mouth at bedtime.  11   No facility-administered medications prior to visit.         Objective:   Physical Exam  Today's Vitals   12/17/22 1333  BP: 116/60  Pulse: 67  SpO2: 90%  Weight: 224 lb 6.4 oz (101.8 kg)  Height: 6' 2" (1.88 m)   Body mass index is 28.81 kg/m.;sm  Gen: Pleasant, well-nourished, in no distress,  normal affect  ENT: No lesions,  mouth clear,  oropharynx clear, no postnasal drip  Neck: No JVD, no stridor  Lungs: No use of accessory muscles, no crackles or wheezing on normal respiration, no wheeze on forced expiration  Cardiovascular: RRR, heart sounds normal, no murmur or gallops, no peripheral edema  Musculoskeletal: No deformities, no cyanosis or clubbing  Neuro: alert, awake, non focal  Skin: Warm, no lesions or rash    Assessment & Plan:  Pulmonary nodules/lesions, multiple Slowly enlarging right upper lobe 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   His other nodules are stable in size and appearance.  We we will plan to perform navigational bronchoscopy.  This is actually a good time to do so since he is better compensated from his COPD and his atrial fibrillation.  Will need to stop his Eliquis for 2 days.  He is going to see cardiology on 12/27/2022.  We will tentatively schedule navigational bronchoscopy under general anesthesia for 01/03/2023.  We reviewed your CT scans of the chest today. We will arrange for navigational bronchoscopy to evaluate a right upper lobe pulmonary nodule.  This will be done as an outpatient under general anesthesia at [facility] endoscopy.  You will need to stop aspirin and Eliquis 2 days prior.  You will need a designated driver.  We will try to get this arranged for 01/03/2023. Follow Dr. Kim Lauver in 1 month or next available  COPD (chronic obstructive pulmonary disease) (HCC) Please continue your Stiolto 2 puffs once daily as you have been taking it Keep albuterol available use 2 puffs or 1 nebulizer treatment up to every 4 hours if needed for shortness of breath, chest tightness, wheezing.   Jaimey Franchini, MD, PhD 12/17/2022, 1:52 PM [facility] [phone] or if no answer before 7:00PM call [phone] For any issues after 7:00PM please call eLink [phone]  

## 2022-12-17 NOTE — Progress Notes (Signed)
Subjective:    Patient ID: Kevin [name], male    DOB: [birth year], 82 y.o.   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  PMH also significant for hypertension, A-fib, CVA, CKD stage III, diabetes and ulcerative colitis.  He has a right upper lobe pulmonary nodule and a posterior left upper lobe pulmonary nodule that have been followed on serial imaging.  PET scan done in 01/01/2021 showed some mild hypermetabolism (SUV 2.9).  Most recent scan done on 04/28/2022 as below.  He has O2 > uses 3L/min with heavier exertion. He is on [employer]. Occasional albuterol use, not every day, does benefit. Some wheeze at night. He has nasal gtt and obstruction, has associated cough. Not on an allergy regimen.   CT scan of the chest 04/28/2022 reviewed by me shows significant centrilobular emphysema, bilateral bronchial wall thickening.  There is a stable to slightly enlarged 1.8 x 1.2 cm spiculated right upper lobe nodule.  His 2.2 x 0.8 cm elongated left upper lobe nodule is unchanged   ROV 12/17/2022 --follow-up visit 82 year old man whom I have followed for COPD and ILD in a UIP pattern, pulmonary nodular disease.  Also with a history of hypertension with diastolic CHF, CKD stage IIIb, atrial fibrillation with DCCV on 3/7 (Eliquis), diabetes with a left foot ulcer.  His most recent outpatient CT chest 04/28/2022 showed a persistent 1.8 x 1.2 cm spiculated right upper lobe nodule, 2.2 x 0.8 cm elongated left upper lobe nodule.  Currently managed on Stiolto, uses oxygen 3 L/min with heavy exertion.  He had a repeat CT chest 11/03/2022 as below, then underwent another CT chest 12/08/2022 while he was admitted for acute on chronic hypoxemic respiratory failure, atrial fibrillation with RVR.  CT chest 11/03/2022 reviewed by me shows a continued increase in size of his spiculated right upper lobe pulmonary nodule now measuring 2.0 x  1.3 x 2.1 cm.  His other pulmonary nodules are unchanged including the posterior left upper lobe, superior segment of the left lower lobe   Review of Systems As per HPI  Past Medical History:  Diagnosis Date   A-fib ([place]) 03/20/2018   Acute blood loss anemia    Benign prostatic hyperplasia with urinary retention    Cerebrovascular accident (CVA) ([place])    Diabetes mellitus type 2 in nonobese [facility])    Essential hypertension 03/20/2018   Former tobacco use    H/O ulcerative colitis 1993   Leukocytosis    Osteomyelitis ([place])    a. 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   PAF (paroxysmal atrial fibrillation) ([place])    a. dx 03/2018 in setting of stroke, osteomyelitis.   Stage 3 chronic kidney disease ([facility])    pt/family unaware   Status post transmetatarsal amputation of left foot ([place])      Family History  Problem Relation Age of Onset   Hypertension Father     No hx lung cancer.   Social History   Socioeconomic History   Marital status: Married    Spouse name: Not on file   Number of children: Not on file   Years of education: Not on file   Highest education level: Bachelor's degree (e.g., BA, AB, BS)  Occupational History   Not on file  Tobacco Use   Smoking status: Former    Packs/day: 1.00    Years: 20.00    Additional pack years: 0.00    Total  pack years: 20.00    Types: Cigarettes    Start date: 1963    Quit date: 1985    Years since quitting: 39.2   Smokeless tobacco: Never  Vaping Use   Vaping Use: Never used  Substance and Sexual Activity   Alcohol use: Yes    Alcohol/week: 4.0 standard drinks of alcohol    Types: 4 Cans of beer per week    Comment: social    Drug use: Never   Sexual activity: Not on file  Other Topics Concern   Not on file  Social History Narrative   Lives at home with his wife and two cats. Married for almost 45 years   Right handed   Caffeine: 1 cup every morning   Social Determinants of Health   Financial Resource Strain: Not  on file  Food Insecurity: No Food Insecurity (12/08/2022)   Hunger Vital Sign    Worried About Running Out of Food in the Last Year: Never true    Ran Out of Food in the Last Year: Never true  Transportation Needs: No Transportation Needs (12/08/2022)   PRAPARE - Hydrologist (Medical): No    Lack of Transportation (Non-Medical): No  Physical Activity: Not on file  Stress: Not on file  Social Connections: Not on file  Intimate Partner Violence: Not At Risk (12/08/2022)   Humiliation, Afraid, Rape, and Kick questionnaire    Fear of Current or Ex-Partner: No    Emotionally Abused: No    Physically Abused: No    Sexually Abused: No      No Known Allergies   Outpatient Medications Prior to Visit  Medication Sig Dispense Refill   albuterol (VENTOLIN HFA) 108 (90 Base) MCG/ACT inhaler Inhale 2 puffs into the lungs every 6 (six) hours as needed for wheezing or shortness of breath. 24 g 1   amoxicillin-clavulanate (AUGMENTIN) 875-125 MG tablet Take 1 tablet by mouth 2 (two) times daily for 10 days. 20 tablet 0   apixaban (ELIQUIS) 5 MG TABS tablet Take 1 tablet (5 mg total) by mouth 2 (two) times daily. 180 tablet 1   diltiazem (CARDIZEM CD) 120 MG 24 hr capsule Take 1 capsule (120 mg total) by mouth daily. Take at breakfast. 90 capsule 0   doxycycline (VIBRA-TABS) 100 MG tablet Take 1 tablet (100 mg total) by mouth every 12 (twelve) hours for 10 days. 20 tablet 0   escitalopram (LEXAPRO) 10 MG tablet Take 10 mg by mouth daily.     fluconazole (DIFLUCAN) 200 MG tablet Take 1 tablet (200 mg total) by mouth daily for 6 days. 6 tablet 0   insulin aspart protamine - aspart (NOVOLOG 70/30 MIX) (70-30) 100 UNIT/ML FlexPen Inject 27 Units into the skin 2 (two) times daily with a meal. 15 mL 11   Insulin Pen Needle (PEN NEEDLES 3/16") 31G X 5 MM MISC 1 Pen by Does not apply route 2 (two) times daily before a meal. Before breakfast and dinner 100 each 1   loratadine  (CLARITIN) 10 MG tablet Take 10 mg by mouth at bedtime.     mesalamine (LIALDA) 1.2 g EC tablet Take 1.2 g by mouth daily.     metoprolol succinate (TOPROL-XL) 50 MG 24 hr tablet Take 1 tablet (50 mg total) by mouth daily. Take with lunch. 90 tablet 3   Multiple Vitamins-Minerals (PRESERVISION AREDS 2 PO) Take 1 capsule by mouth 2 (two) times daily.     rosuvastatin (CRESTOR) 10  MG tablet Take 0.5 tablets (5 mg total) by mouth daily. 45 tablet 2   senna-docusate (SENOKOT-S) 8.6-50 MG tablet Take 2 tablets by mouth 2 (two) times daily as needed for moderate constipation.     tamsulosin (FLOMAX) 0.4 MG CAPS capsule Take 1 capsule (0.4 mg total) by mouth 2 times daily at 12 noon and 4 pm. (Patient taking differently: Take 0.4 mg by mouth in the morning and at bedtime.) 60 capsule 0   Tiotropium Bromide-Olodaterol (STIOLTO RESPIMAT) 2.5-2.5 MCG/ACT AERS Inhale 2 puffs into the lungs daily. 4 g 11   [START ON 12/20/2022] trimethoprim (TRIMPEX) 100 MG tablet Take 1 tablet (100 mg total) by mouth at bedtime.  11   No facility-administered medications prior to visit.         Objective:   Physical Exam  Today's Vitals   12/17/22 1333  BP: 116/60  Pulse: 67  SpO2: 90%  Weight: 224 lb 6.4 oz (101.8 kg)  Height: 6\' 2"  (1.88 m)   Body mass index is 28.81 kg/m.;sm  Gen: Pleasant, well-nourished, in no distress,  normal affect  ENT: No lesions,  mouth clear,  oropharynx clear, no postnasal drip  Neck: No JVD, no stridor  Lungs: No use of accessory muscles, no crackles or wheezing on normal respiration, no wheeze on forced expiration  Cardiovascular: RRR, heart sounds normal, no murmur or gallops, no peripheral edema  Musculoskeletal: No deformities, no cyanosis or clubbing  Neuro: alert, awake, non focal  Skin: Warm, no lesions or rash    Assessment & Plan:  Pulmonary nodules/lesions, multiple Slowly enlarging right upper lobe 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   His other nodules are stable in size and appearance.  We we will plan to perform navigational bronchoscopy.  This is actually a good time to do so since he is better compensated from his COPD and his atrial fibrillation.  Will need to stop his Eliquis for 2 days.  He is going to see cardiology on 12/27/2022.  We will tentatively schedule navigational bronchoscopy under general anesthesia for 01/03/2023.  We reviewed your CT scans of the chest today. We will arrange for navigational bronchoscopy to evaluate a right upper lobe pulmonary nodule.  This will be done as an outpatient under general anesthesia at [facility] endoscopy.  You will need to stop aspirin and Eliquis 2 days prior.  You will need a designated driver.  We will try to get this arranged for 01/03/2023. Follow Dr. Lamonte Sakai in 1 month or next available  COPD (chronic obstructive pulmonary disease) ([place]) Please continue your Stiolto 2 puffs once daily as you have been taking it Keep albuterol available use 2 puffs or 1 nebulizer treatment up to every 4 hours if needed for shortness of breath, chest tightness, wheezing.   Baltazar Apo, MD, PhD 12/17/2022, 1:52 PM [facility] [phone] or if no answer before 7:00PM call [phone] For any issues after 7:00PM please call eLink [phone]

## 2022-12-17 NOTE — Patient Instructions (Addendum)
We reviewed your CT scans of the chest today. We will arrange for navigational bronchoscopy to evaluate a right upper lobe pulmonary nodule.  This will be done as an outpatient under general anesthesia at [facility] endoscopy.  You will need to stop aspirin and Eliquis 2 days prior.  You will need a designated driver.  We will try to get this arranged for 01/03/2023. Please continue your Stiolto 2 puffs once daily as you have been taking it Keep albuterol available use 2 puffs or 1 nebulizer treatment up to every 4 hours if needed for shortness of breath, chest tightness, wheezing. Follow Dr. Lamonte Sakai in 1 month or next available

## 2022-12-17 NOTE — Assessment & Plan Note (Signed)
Please continue your Stiolto 2 puffs once daily as you have been taking it Keep albuterol available use 2 puffs or 1 nebulizer treatment up to every 4 hours if needed for shortness of breath, chest tightness, wheezing.

## 2022-12-22 DIAGNOSIS — I4891 Unspecified atrial fibrillation: Secondary | ICD-10-CM

## 2022-12-22 DIAGNOSIS — I480 Paroxysmal atrial fibrillation: Secondary | ICD-10-CM

## 2022-12-23 MED ORDER — DILTIAZEM HCL ER COATED BEADS 120 MG PO CP24: 120.0000 mg | ORAL_CAPSULE | Freq: Every day | ORAL | 1 refills | Status: DC

## 2022-12-23 MED ORDER — ROSUVASTATIN CALCIUM 10 MG PO TABS: 5.0000 mg | ORAL_TABLET | Freq: Every day | ORAL | 1 refills | Status: DC

## 2022-12-23 MED ORDER — APIXABAN 5 MG PO TABS: 5.0000 mg | ORAL_TABLET | Freq: Two times a day (BID) | ORAL | 1 refills | Status: DC

## 2022-12-27 ENCOUNTER — Ambulatory Visit (HOSPITAL_COMMUNITY)
Admission: RE | Admit: 2022-12-27 | Discharge: 2022-12-27 | Disposition: A | Discharge status: Home / Self Care | Payer: Medicare Other | Source: Ambulatory Visit | Attending: Internal Medicine | Admitting: Internal Medicine

## 2022-12-27 VITALS — BP 122/64 | HR 68 | Ht 74.0 in | Wt 233.4 lb

## 2022-12-27 DIAGNOSIS — I10 Essential (primary) hypertension: Secondary | ICD-10-CM | POA: Diagnosis not present

## 2022-12-27 DIAGNOSIS — R0602 Shortness of breath: Secondary | ICD-10-CM | POA: Diagnosis not present

## 2022-12-27 DIAGNOSIS — I4819 Other persistent atrial fibrillation: Secondary | ICD-10-CM | POA: Insufficient documentation

## 2022-12-27 DIAGNOSIS — Z7901 Long term (current) use of anticoagulants: Secondary | ICD-10-CM | POA: Insufficient documentation

## 2022-12-27 DIAGNOSIS — I480 Paroxysmal atrial fibrillation: Secondary | ICD-10-CM

## 2022-12-27 DIAGNOSIS — J449 Chronic obstructive pulmonary disease, unspecified: Secondary | ICD-10-CM | POA: Insufficient documentation

## 2022-12-27 DIAGNOSIS — D6869 Other thrombophilia: Secondary | ICD-10-CM | POA: Diagnosis not present

## 2022-12-27 NOTE — Progress Notes (Addendum)
Primary Care Physician: [name], MD Primary Cardiologist: Dr. [name] Primary Electrophysiologist: None Referring Physician:    [name] is a 82 y.o. 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 History of cardioversion in 2019. Seen by Cardiology on 11/25/22 for shortness of breath and was noted to be in Afib without RVR. He underwent DCCV on 12/02/22 with successful conversion to normal rhythm after 2 shocks. He was recently admitted 3/12-18/24 for acute on chronic respiratory failure with hypoxia in the setting of COPD and CHF exacerbation and atrial fibrillation. Patient is on Eliquis 5 mg BID for a CHADS2VASC score of 8.  On evaluation today, he is in Afib today. Wife and patient agree that he seems to be more short of breath when he is in Afib. He uses oxygen at night. Review of records demonstrate he underwent DCCV on 3/7 and most likely went back into Afib on 3/12. He has been compliant with Eliquis and has not missed any doses. He has a scheduled bronchoscopy for RUL nodule on 4/8. He will stop taking Eliquis on 4/5 prior to procedure.   He drinks several beers on the weekend.   Today, he denies symptoms of palpitations, chest pain, orthopnea, PND, lower extremity edema, dizziness, presyncope, syncope, snoring, daytime somnolence, bleeding, or neurologic sequela. The patient is tolerating medications without difficulties and is otherwise without complaint today.    Atrial Fibrillation Risk Factors:  he does not have symptoms or diagnosis of sleep apnea. he does not have a history of rheumatic fever. he does not have a history of alcohol use. The  patient does not have a history of early familial atrial fibrillation or other arrhythmias.  he has a BMI of Body mass index is 29.97 kg/m.Marland Kitchen Filed Weights   12/27/22 1320  Weight: 105.9 kg    Family History  Problem Relation Age of Onset   Hypertension Father      Atrial Fibrillation Management history:  Previous antiarrhythmic drugs: None Previous cardioversions: 2019, 12/02/22 Previous ablations: None Anticoagulation history: Eliquis 5 mg BID   Past Medical History:  Diagnosis Date   A-fib ([place]) 03/20/2018   Acute blood loss anemia    Benign prostatic hyperplasia with urinary retention    Cerebrovascular accident (CVA) ([place])    Diabetes mellitus type 2 in nonobese [facility])    Essential hypertension 03/20/2018   Former tobacco use    H/O ulcerative colitis 1993   Leukocytosis    Osteomyelitis ([place])    a. 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   PAF (paroxysmal atrial fibrillation) ([place])    a. dx 03/2018 in setting of stroke, osteomyelitis.   Stage 3 chronic kidney disease ([place])    pt/family unaware   Status post transmetatarsal amputation of left foot [facility])    Past Surgical History:  Procedure Laterality Date   AMPUTATION Left 03/24/2018   Procedure: Transmetatarsal Amputation Left Foot;  Surgeon: [name], MD;  Location: [place];  Service: Orthopedics;  Laterality: Left;   CARDIOVERSION N/A 05/05/2018   Procedure: CARDIOVERSION;  Surgeon: [name], MD;  Location: [place];  Service: Cardiovascular;  Laterality: N/A;   CARDIOVERSION N/A 12/02/2022   Procedure: CARDIOVERSION;  Surgeon: Buford Dresser, MD;  Location: [place];  Service: Cardiovascular;  Laterality: N/A;   COLONOSCOPY  12/2017   benign, remission from ulcerative colitis in 90s   I & D EXTREMITY Left 03/17/2018   Procedure: IRRIGATION AND DEBRIDEMENT FOOT;  Surgeon: [name], MD;  Location: [place];  Service: Orthopedics;  Laterality: Left;   STUMP REVISION Left 05/10/2018    Procedure: REVISION LEFT TRANSMETATARSAL AMPUTATION;  Surgeon: [name], MD;  Location: [place];  Service: Orthopedics;  Laterality: Left;    Current Outpatient Medications  Medication Sig Dispense Refill   albuterol (VENTOLIN HFA) 108 (90 Base) MCG/ACT inhaler Inhale 2 puffs into the lungs every 6 (six) hours as needed for wheezing or shortness of breath. 24 g 1   apixaban (ELIQUIS) 5 MG TABS tablet Take 1 tablet (5 mg total) by mouth 2 (two) times daily. 180 tablet 1   diltiazem (CARDIZEM CD) 120 MG 24 hr capsule Take 1 capsule (120 mg total) by mouth daily. Take at breakfast. 90 capsule 1   escitalopram (LEXAPRO) 10 MG tablet Take 10 mg by mouth daily.     insulin aspart protamine - aspart (NOVOLOG 70/30 MIX) (70-30) 100 UNIT/ML FlexPen Inject 27 Units into the skin 2 (two) times daily with a meal. 15 mL 11   Insulin Pen Needle (PEN NEEDLES 3/16") 31G X 5 MM MISC 1 Pen by Does not apply route 2 (two) times daily before a meal. Before breakfast and dinner 100 each 1   loratadine (CLARITIN) 10 MG tablet Take 10 mg by mouth at bedtime.     melatonin 3 MG TABS tablet Take 6 mg by mouth at bedtime.     mesalamine (LIALDA) 1.2 g EC tablet Take 1.2 g by mouth daily.     metoprolol succinate (TOPROL-XL) 50 MG 24 hr tablet Take 1 tablet (50 mg total) by mouth daily. Take with lunch. 90 tablet 3   Multiple Vitamins-Minerals (PRESERVISION AREDS 2 PO) Take 1 capsule by mouth 2 (two) times daily.     rosuvastatin (CRESTOR) 10 MG tablet Take 0.5 tablets (5 mg total) by mouth daily. 45 tablet 1   senna-docusate (SENOKOT-S) 8.6-50 MG tablet Take 2 tablets by mouth 2 (two) times daily as needed for moderate constipation. (Patient taking differently: Take 1 tablet by mouth daily.)     tamsulosin (FLOMAX) 0.4 MG CAPS capsule Take 1 capsule (0.4 mg total) by mouth 2 times daily at 12 noon and 4 pm. (Patient taking differently: Take 0.4 mg by mouth in the morning and at bedtime.) 60 capsule 0   Tiotropium  Bromide-Olodaterol (STIOLTO RESPIMAT) 2.5-2.5 MCG/ACT AERS Inhale 2 puffs into the lungs daily. 4 g 11   trimethoprim (TRIMPEX) 100 MG tablet Take 1 tablet (100 mg total) by mouth at bedtime.  11   No current facility-administered medications for this encounter.    No Known Allergies  Social History   Socioeconomic History   Marital status: Married    Spouse name: Not on file   Number of children: Not on file   Years of education: Not on file   Highest education level: Bachelor's degree (e.g., BA, AB, BS)  Occupational History   Not on file  Tobacco Use   Smoking status: Former    Packs/day: 1.00    Years: 20.00    Additional pack years: 0.00    Total pack years: 20.00    Types: Cigarettes    Start date: 50    Quit date: 103    Years since quitting: 38.2  Smokeless tobacco: Never  Vaping Use   Vaping Use: Never used  Substance and Sexual Activity   Alcohol use: Yes    Alcohol/week: 4.0 standard drinks of alcohol    Types: 4 Cans of beer per week    Comment: social    Drug use: Never   Sexual activity: Not on file  Other Topics Concern   Not on file  Social History Narrative   Lives at home with his wife and two cats. Married for almost 45 years   Right handed   Caffeine: 1 cup every morning   Social Determinants of Health   Financial Resource Strain: Not on file  Food Insecurity: No Food Insecurity (12/08/2022)   Hunger Vital Sign    Worried About Running Out of Food in the Last Year: Never true    Ran Out of Food in the Last Year: Never true  Transportation Needs: No Transportation Needs (12/08/2022)   PRAPARE - Hydrologist (Medical): No    Lack of Transportation (Non-Medical): No  Physical Activity: Not on file  Stress: Not on file  Social Connections: Not on file  Intimate Partner Violence: Not At Risk (12/08/2022)   Humiliation, Afraid, Rape, and Kick questionnaire    Fear of Current or Ex-Partner: No    Emotionally  Abused: No    Physically Abused: No    Sexually Abused: No     ROS- All systems are reviewed and negative except as per the HPI above.  Physical Exam: Vitals:   12/27/22 1320  Weight: 105.9 kg  Height: 6\' 2"  (1.88 m)    GEN- The patient is well appearing, alert and oriented x 3 today.   Head- normocephalic, atraumatic Eyes-  Sclera clear, conjunctiva pink Ears- hearing intact Oropharynx- clear Neck- supple, no JVP Lymph- no cervical lymphadenopathy Lungs- Clear to ausculation bilaterally, normal work of breathing Heart- Irregular rate and rhythm, no murmurs, rubs or gallops, PMI not laterally displaced GI- soft, NT, ND, + BS Extremities- no clubbing, cyanosis, or edema MS- no significant deformity or atrophy Skin- no rash or lesion Psych- euthymic mood, full affect Neuro- strength and sensation are intact   Wt Readings from Last 3 Encounters:  12/27/22 105.9 kg  12/17/22 101.8 kg  12/13/22 98.4 kg    EKG today demonstrates Afib RBBB LAFB HR 68 QT/Qtc 438/465 ms  Echo 12/08/22 demonstrated: 1. Left ventricular ejection fraction, by estimation, is 55 to 60%. The  left ventricle has normal function. The left ventricle has no regional  wall motion abnormalities. Left ventricular diastolic parameters are  indeterminate. There is the  interventricular septum is flattened in systole, suggestive of right  ventricular pressure overload.   2. Right ventricular systolic function is mildly reduced. The right  ventricular size is moderately enlarged. There is normal pulmonary artery  systolic pressure. The estimated right ventricular systolic pressure is  [id number] mmHg.   3. Left atrial size was mildly dilated.   4. Right atrial size was moderately dilated.   5. The mitral valve is grossly normal. Trivial mitral valve  regurgitation. No evidence of mitral stenosis.   6. The aortic valve is tricuspid. There is mild calcification of the  aortic valve. There is mild thickening  of the aortic valve. Aortic valve  regurgitation is not visualized. No aortic stenosis is present.   7. The inferior vena cava is normal in size with greater than 50%  respiratory variability, suggesting right atrial pressure of 3 mmHg.  Epic records are reviewed at length today.  CHA2DS2-VASc Score = 8  The patient's score is based upon: CHF History: 1 HTN History: 1 Diabetes History: 1 Stroke History: 2 Vascular Disease History: 1 Age Score: 2 Gender Score: 0       ASSESSMENT AND PLAN: Persistent Atrial Fibrillation (ICD10:  I48.0) The patient's CHA2DS2-VASc score is 8, indicating a 10.8% annual risk of stroke.    Education provided about Afib with visual diagram. Discussion about medication treatments and ablation in detail. After discussion, it does not appear he would be a good candidate for ablation given history of chronic RF/oxygen use. He would not be a good candidate for flecainide given conduction disease. Multaq would be less favorable with history of chronic diastolic dysfunction. Amiodarone would be less favorable due to COPD and abnormal LFTs. After discussion, he would be a candidate for Tikosyn and explained process of 3 day admission for monitoring. His calculated CrCl is 45 mL/min so he could take the 250 mcg BID dose. He and wife would favor this medication treatment to try and get him into normal rhythm and hope he can feel less short of breath. We will proceed with conservative observation at this time in light of bronchoscopy.   Agree that we will plan to f/u in 1 month to discuss medication treatment option again in the form of Tikosyn.   Continue diltiazem and metoprolol daily.  2. 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  Continue Apixaban (Eliquis).  No missed doses. He will have completed 4 weeks of anticoagulation s/p DCCV on 4/4.   3.  HTN Stable today; no changes.    Follow up in 1 month.   Emily Filbert, PA-C [facility] [address] [place], [place] [address] [phone] 12/27/2022 1:47 PM

## 2022-12-29 ENCOUNTER — Other Ambulatory Visit: Payer: Self-pay

## 2022-12-29 ENCOUNTER — Encounter (HOSPITAL_COMMUNITY): Payer: Self-pay | Admitting: Emergency Medicine

## 2022-12-29 NOTE — Anesthesia Preprocedure Evaluation (Addendum)
Anesthesia Evaluation  Patient identified by MRN, date of birth, ID band Patient awake    Reviewed: Allergy & Precautions, H&P , NPO status , Patient's Chart, lab work & pertinent test results  Airway Mallampati: II   Neck ROM: full    Dental   Pulmonary COPD, former smoker RUL nodule   breath sounds clear to auscultation       Cardiovascular hypertension, + CAD and +CHF  + dysrhythmias Atrial Fibrillation  Rhythm:regular Rate:Normal     Neuro/Psych   Anxiety     CVA    GI/Hepatic   Endo/Other  diabetes, Type 2    Renal/GU Renal InsufficiencyRenal disease     Musculoskeletal  (+) Arthritis ,    Abdominal   Peds  Hematology   Anesthesia Other Findings   Reproductive/Obstetrics                             Anesthesia Physical Anesthesia Plan  ASA: 3  Anesthesia Plan: General   Post-op Pain Management:    Induction: Intravenous  PONV Risk Score and Plan: 2 and Ondansetron, Dexamethasone and Treatment may vary due to age or medical condition  Airway Management Planned: Oral ETT  Additional Equipment:   Intra-op Plan:   Post-operative Plan: Extubation in OR  Informed Consent: I have reviewed the patients History and Physical, chart, labs and discussed the procedure including the risks, benefits and alternatives for the proposed anesthesia with the patient or authorized representative who has indicated his/her understanding and acceptance.     Dental advisory given  Plan Discussed with: CRNA, Anesthesiologist and Surgeon  Anesthesia Plan Comments: (PAT note by Antionette Poles, PA-C:  82 y.o. 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   He was seen by cardiology on 11/25/22 for  shortness of breath and was noted to be in Afib. He underwent DCCV on 12/02/22 with initially successful conversion to normal rhythm after 2 shocks. He was recently admitted 3/12-18/24 for acute on chronic respiratory failure with hypoxia in the setting of COPD and CHF exacerbation and atrial fibrillation.  Had follow-up in the A-fib clinic on 12/27/2022.  Upcoming bronchoscopy was discussed.  Per note, "On evaluation today, he is in Afib today. Wife and patient agree that he seems to be more short of breath when he is in Afib. He uses oxygen at night. Review of records demonstrate he underwent DCCV on 3/7 and most likely went back into Afib on 3/12. He has been compliant with Eliquis and has not missed any doses. He has a scheduled bronchoscopy for RUL nodule on 4/8. He will stop taking Eliquis on 4/5 prior to procedure."  CMP from 12/13/2022 reviewed, creatinine elevated 1.99 consistent with history of CKD, mild transaminitis with AST 72 ALT 78, hypoalbuminemia with albumin 2.7.  CBC from 12/12/2022 reviewed, WNL.  Patient will need day of surgery evaluation.  EKG 12/27/2022: Atrial fibrillation.  Rate 68.  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  CT Chest 12/08/22: IMPRESSION: 1. No evidence of pneumonia. 2. No significant change from 11/03/2022 chest CT. 3. Similar right upper lobe pulmonary nodule which had enlarged on that exam and is suspicious for primary bronchogenic carcinoma. Smaller left-sided pulmonary nodules are unchanged. 4. Aortic atherosclerosis (ICD10-I70.0), coronary artery  atherosclerosis and emphysema (ICD10-J43.9). 5. Right adrenal adenoma . In the absence of clinically indicated signs/symptoms require(s) no independent follow-up. 6. Esophageal air fluid level suggests dysmotility or gastroesophageal reflux.  TTE 12/08/2022: 1. Left ventricular ejection fraction, by estimation, is 55 to 60%. The  left ventricle has normal function. The left ventricle has  no regional  wall motion abnormalities. Left ventricular diastolic parameters are  indeterminate. There is the  interventricular septum is flattened in systole, suggestive of right  ventricular pressure overload.  2. Right ventricular systolic function is mildly reduced. The right  ventricular size is moderately enlarged. There is normal pulmonary artery  systolic pressure. The estimated right ventricular systolic pressure is  30.5 mmHg.  3. Left atrial size was mildly dilated.  4. Right atrial size was moderately dilated.  5. The mitral valve is grossly normal. Trivial mitral valve  regurgitation. No evidence of mitral stenosis.  6. The aortic valve is tricuspid. There is mild calcification of the  aortic valve. There is mild thickening of the aortic valve. Aortic valve  regurgitation is not visualized. No aortic stenosis is present.  7. The inferior vena cava is normal in size with greater than 50%  respiratory variability, suggesting right atrial pressure of 3 mmHg.   Nuclear stress 04/15/2020: ? The left ventricular ejection fraction is normal (55-65%). ? Nuclear stress EF: 56%. ? No T wave inversion was noted during stress. ? There was no ST segment deviation noted during stress. ? Defect 1: There is a medium defect of moderate severity present in the basal inferior, mid inferior, apical inferior and apex location. ? This is a low risk study.   Medium size, moderate intensity fixed (SDS 0) inferior perfusion defect, suggestive of RBBB artifact.  No significant reversible ischemia. LVEF 56% with normal wall motion. This is a low risk study.   )        Anesthesia Quick Evaluation

## 2022-12-29 NOTE — Progress Notes (Signed)
Anesthesia Chart Review: Same day workup  82 y.o. 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    He was seen by cardiology on 11/25/22 for shortness of breath and was noted to be in Afib. He underwent DCCV on 12/02/22 with initially successful conversion to normal rhythm after 2 shocks. He was recently admitted 3/12-18/24 for acute on chronic respiratory failure with hypoxia in the setting of COPD and CHF exacerbation and atrial fibrillation.  Had follow-up in the A-fib clinic on 12/27/2022.  Upcoming bronchoscopy was discussed.  Per note, "On evaluation today, he is in Afib today. Wife and patient agree that he seems to be more short of breath when he is in Afib. He uses oxygen at night. Review of records demonstrate he underwent DCCV on 3/7 and most likely went back into Afib on 3/12. He has been compliant with Eliquis and has not missed any doses. He has a scheduled bronchoscopy for RUL nodule on 4/8. He will stop taking Eliquis on 4/5 prior to procedure."  CMP from 12/13/2022 reviewed, creatinine elevated 1.99 consistent with history of CKD, mild transaminitis with AST 72 ALT 78, hypoalbuminemia with albumin 2.7.  CBC from 12/12/2022 reviewed, WNL.  Patient will need day of surgery evaluation.  EKG 12/27/2022: Atrial fibrillation.  Rate 68.  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  CT Chest 12/08/22: IMPRESSION: 1. No evidence of pneumonia. 2. No significant change from 11/03/2022 chest CT. 3. Similar right upper lobe pulmonary nodule which had enlarged on that exam and is suspicious for primary bronchogenic carcinoma. Smaller left-sided pulmonary nodules are unchanged. 4. Aortic atherosclerosis (ICD10-I70.0), coronary artery atherosclerosis and emphysema (ICD10-J43.9). 5.  Right adrenal adenoma . In the absence of clinically indicated signs/symptoms require(s) no independent follow-up. 6. Esophageal air fluid level suggests dysmotility or gastroesophageal reflux.   TTE 12/08/2022:  1. Left ventricular ejection fraction, by estimation, is 55 to 60%. The  left ventricle has normal function. The left ventricle has no regional  wall motion abnormalities. Left ventricular diastolic parameters are  indeterminate. There is the  interventricular septum is flattened in systole, suggestive of right  ventricular pressure overload.   2. Right ventricular systolic function is mildly reduced. The right  ventricular size is moderately enlarged. There is normal pulmonary artery  systolic pressure. The estimated right ventricular systolic pressure is  [id number] mmHg.   3. Left atrial size was mildly dilated.   4. Right atrial size was moderately dilated.   5. The mitral valve is grossly normal. Trivial mitral valve  regurgitation. No evidence of mitral stenosis.   6. The aortic valve is tricuspid. There is mild calcification of the  aortic valve. There is mild thickening of the aortic valve. Aortic valve  regurgitation is not visualized. No aortic stenosis is present.   7. The inferior vena cava is normal in size with greater than 50%  respiratory variability, suggesting right atrial pressure of 3 mmHg.   Nuclear stress 04/15/2020: The left ventricular ejection fraction is normal (55-65%). Nuclear stress EF: 56%. No T wave inversion was noted during stress. There was no ST segment deviation noted during stress. Defect 1: There is a medium defect of moderate severity present in the basal inferior, mid inferior, apical inferior and apex location. This is a low risk study.  Medium size, moderate intensity fixed (SDS 0) inferior perfusion defect, suggestive of RBBB artifact.  No significant reversible ischemia. LVEF 56% with normal wall motion. This is a low risk  study.    Wynonia Musty [facility] Short Stay Center/Anesthesiology Phone [phone] 12/29/2022 12:26 PM

## 2022-12-29 NOTE — Progress Notes (Signed)
SDW call  Patient was given pre-op instructions over the phone. Patient verbalized understanding of instructions provided.     PCP -  Cardiologist - Dr. Julianne Handler Pulmonary: Dr. Lamonte Sakai   PPM/ICD - Denies  Chest x-ray - 12/07/2022 EKG -  12/14/2022 Stress Test - 04/15/2020 ECHO - 12/08/2022 Cardiac Cath -   Sleep Study/sleep apnea/CPAP.  Has not been diagnosed with sleep apnea. Patient does wear oxygen 2-3 L/[place] at night  Type II Diabetic.   Fasting Blood sugar range: 127-140 How often check sugars. Continuous. Freestyle Libre to left arm Insulin Aspart protamine (Novolog 70/30). Day before surgery: morning dose = 27 units (normal dose). Evening dose=19 units (70% normal dose).  DOS = zero units    Blood Thinner Instructions: Taking Eliquis. Told by surgeon to stop 01/01/2023; last dose taken 12/31/2022 Aspirin Instructions: Denies   ERAS Protcol - No, NPO PRE-SURGERY Ensure or G2- No   COVID TEST- Obtaining covid test at [facility] 12/30/2022    Anesthesia review: Yes. HTN, stroke, DM, stage III CKD, A-Fib with cardioversion, wears oxygen at night   Patient denies shortness of breath, fever, cough and chest pain over the phone call    Your procedure is scheduled on Monday January 03, 2023  Report to [place] Entrance "A" at  0530 A.M., then check in with the Admitting office.  Call this number if you have problems the morning of surgery:  [phone]   If you have any questions prior to your surgery date call [phone]: Open Monday-Friday 8am-4pm If you experience any cold or flu symptoms such as cough, fever, chills, shortness of breath, etc. between now and your scheduled surgery, please notify us at the above number     Remember:  Do not eat or drink after midnight the night before your surgery   Take these medicines the morning of surgery with A SIP OF WATER:  Diltiazem, lexapro, mesalamine, flomax, stioto respirmat inhaler  As needed: Albuterol  As  of today, STOP taking any Aspirin (unless otherwise instructed by your surgeon) Aleve, Naproxen, Ibuprofen, Motrin, Advil, Goody's, BC's, all herbal medications, fish oil, and all vitamins.

## 2022-12-30 ENCOUNTER — Other Ambulatory Visit: Payer: Medicare Other

## 2022-12-30 DIAGNOSIS — Z01818 Encounter for other preprocedural examination: Secondary | ICD-10-CM | POA: Diagnosis not present

## 2022-12-31 LAB — NOVEL CORONAVIRUS, NAA: SARS-CoV-2, NAA: NOT DETECTED

## 2023-01-03 ENCOUNTER — Ambulatory Visit (HOSPITAL_COMMUNITY): Payer: Medicare Other

## 2023-01-03 ENCOUNTER — Ambulatory Visit (HOSPITAL_BASED_OUTPATIENT_CLINIC_OR_DEPARTMENT_OTHER): Payer: Medicare Other | Admitting: Physician Assistant

## 2023-01-03 ENCOUNTER — Ambulatory Visit (HOSPITAL_COMMUNITY): Payer: Medicare Other | Admitting: Physician Assistant

## 2023-01-03 ENCOUNTER — Encounter (HOSPITAL_COMMUNITY)
Admission: RE | Disposition: A | Discharge status: Home / Self Care | Payer: Self-pay | Source: Home / Self Care | Attending: Emergency Medicine

## 2023-01-03 ENCOUNTER — Encounter (HOSPITAL_COMMUNITY): Payer: Self-pay | Admitting: Emergency Medicine

## 2023-01-03 ENCOUNTER — Other Ambulatory Visit: Payer: Self-pay

## 2023-01-03 ENCOUNTER — Ambulatory Visit (HOSPITAL_COMMUNITY)
Admission: RE | Admit: 2023-01-03 | Discharge: 2023-01-03 | Disposition: A | Discharge status: Home / Self Care | Payer: Medicare Other | Attending: Emergency Medicine | Admitting: Emergency Medicine

## 2023-01-03 DIAGNOSIS — C3411 Malignant neoplasm of upper lobe, right bronchus or lung: Secondary | ICD-10-CM | POA: Diagnosis not present

## 2023-01-03 DIAGNOSIS — R846 Abnormal cytological findings in specimens from respiratory organs and thorax: Secondary | ICD-10-CM | POA: Diagnosis not present

## 2023-01-03 DIAGNOSIS — E1122 Type 2 diabetes mellitus with diabetic chronic kidney disease: Secondary | ICD-10-CM | POA: Diagnosis not present

## 2023-01-03 DIAGNOSIS — E11621 Type 2 diabetes mellitus with foot ulcer: Secondary | ICD-10-CM | POA: Insufficient documentation

## 2023-01-03 DIAGNOSIS — I110 Hypertensive heart disease with heart failure: Secondary | ICD-10-CM

## 2023-01-03 DIAGNOSIS — R918 Other nonspecific abnormal finding of lung field: Secondary | ICD-10-CM

## 2023-01-03 DIAGNOSIS — F419 Anxiety disorder, unspecified: Secondary | ICD-10-CM | POA: Diagnosis not present

## 2023-01-03 DIAGNOSIS — L97529 Non-pressure chronic ulcer of other part of left foot with unspecified severity: Secondary | ICD-10-CM | POA: Diagnosis not present

## 2023-01-03 DIAGNOSIS — Z79899 Other long term (current) drug therapy: Secondary | ICD-10-CM | POA: Insufficient documentation

## 2023-01-03 DIAGNOSIS — Z8673 Personal history of transient ischemic attack (TIA), and cerebral infarction without residual deficits: Secondary | ICD-10-CM | POA: Diagnosis not present

## 2023-01-03 DIAGNOSIS — J449 Chronic obstructive pulmonary disease, unspecified: Secondary | ICD-10-CM | POA: Diagnosis not present

## 2023-01-03 DIAGNOSIS — I5032 Chronic diastolic (congestive) heart failure: Secondary | ICD-10-CM | POA: Diagnosis not present

## 2023-01-03 DIAGNOSIS — Z7901 Long term (current) use of anticoagulants: Secondary | ICD-10-CM | POA: Diagnosis not present

## 2023-01-03 DIAGNOSIS — J84112 Idiopathic pulmonary fibrosis: Secondary | ICD-10-CM | POA: Insufficient documentation

## 2023-01-03 DIAGNOSIS — I4891 Unspecified atrial fibrillation: Secondary | ICD-10-CM | POA: Insufficient documentation

## 2023-01-03 DIAGNOSIS — Z87891 Personal history of nicotine dependence: Secondary | ICD-10-CM | POA: Insufficient documentation

## 2023-01-03 DIAGNOSIS — N1832 Chronic kidney disease, stage 3b: Secondary | ICD-10-CM | POA: Diagnosis not present

## 2023-01-03 DIAGNOSIS — Z9981 Dependence on supplemental oxygen: Secondary | ICD-10-CM | POA: Insufficient documentation

## 2023-01-03 DIAGNOSIS — I509 Heart failure, unspecified: Secondary | ICD-10-CM | POA: Diagnosis not present

## 2023-01-03 DIAGNOSIS — C3412 Malignant neoplasm of upper lobe, left bronchus or lung: Secondary | ICD-10-CM | POA: Insufficient documentation

## 2023-01-03 DIAGNOSIS — I2510 Atherosclerotic heart disease of native coronary artery without angina pectoris: Secondary | ICD-10-CM

## 2023-01-03 DIAGNOSIS — I130 Hypertensive heart and chronic kidney disease with heart failure and stage 1 through stage 4 chronic kidney disease, or unspecified chronic kidney disease: Secondary | ICD-10-CM | POA: Insufficient documentation

## 2023-01-03 DIAGNOSIS — C3490 Malignant neoplasm of unspecified part of unspecified bronchus or lung: Secondary | ICD-10-CM | POA: Diagnosis not present

## 2023-01-03 DIAGNOSIS — I480 Paroxysmal atrial fibrillation: Secondary | ICD-10-CM

## 2023-01-03 HISTORY — DX: Anxiety disorder, unspecified: F41.9

## 2023-01-03 HISTORY — PX: BRONCHIAL NEEDLE ASPIRATION BIOPSY: SHX5106

## 2023-01-03 HISTORY — PX: BRONCHIAL BIOPSY: SHX5109

## 2023-01-03 HISTORY — PX: FIDUCIAL MARKER PLACEMENT: SHX6858

## 2023-01-03 HISTORY — DX: Cardiac arrhythmia, unspecified: I49.9

## 2023-01-03 HISTORY — DX: Umbilical hernia without obstruction or gangrene: K42.9

## 2023-01-03 HISTORY — DX: Heart failure, unspecified: I50.9

## 2023-01-03 HISTORY — PX: BRONCHIAL BRUSHINGS: SHX5108

## 2023-01-03 HISTORY — PX: VIDEO BRONCHOSCOPY WITH RADIAL ENDOBRONCHIAL ULTRASOUND: SHX6849

## 2023-01-03 HISTORY — DX: Dyspnea, unspecified: R06.00

## 2023-01-03 HISTORY — DX: Chronic obstructive pulmonary disease, unspecified: J44.9

## 2023-01-03 LAB — BASIC METABOLIC PANEL
Anion gap: 12 (ref 5–15)
BUN: 27 mg/dL — ABNORMAL HIGH (ref 8–23)
CO2: 23 mmol/L (ref 22–32)
Calcium: 8.6 mg/dL — ABNORMAL LOW (ref 8.9–10.3)
Chloride: 103 mmol/L (ref 98–111)
Creatinine, Ser: 1.36 mg/dL — ABNORMAL HIGH (ref 0.61–1.24)
GFR, Estimated: 53 mL/min — ABNORMAL LOW (ref >60)
Glucose, Bld: 138 mg/dL — ABNORMAL HIGH (ref 70–99)
Potassium: 4.3 mmol/L (ref 3.5–5.1)
Sodium: 138 mmol/L (ref 135–145)

## 2023-01-03 LAB — CBC
HCT: 36.5 % — ABNORMAL LOW (ref 39.0–52.0)
Hemoglobin: 12 g/dL — ABNORMAL LOW (ref 13.0–17.0)
MCH: 29.3 pg (ref 26.0–34.0)
MCHC: 32.9 g/dL (ref 30.0–36.0)
MCV: 89 fL (ref 80.0–100.0)
Platelets: 231 10*3/uL (ref 150–400)
RBC: 4.1 MIL/uL — ABNORMAL LOW (ref 4.22–5.81)
RDW: 16 % — ABNORMAL HIGH (ref 11.5–15.5)
WBC: 9.4 10*3/uL (ref 4.0–10.5)
nRBC: 0 % (ref 0.0–0.2)

## 2023-01-03 LAB — GLUCOSE, CAPILLARY
Glucose-Capillary: 141 mg/dL — ABNORMAL HIGH (ref 70–99)
Glucose-Capillary: 173 mg/dL — ABNORMAL HIGH (ref 70–99)

## 2023-01-03 SURGERY — BRONCHOSCOPY, WITH BIOPSY USING ELECTROMAGNETIC NAVIGATION
Anesthesia: General

## 2023-01-03 MED ORDER — FENTANYL CITRATE (PF) 100 MCG/2ML IJ SOLN
INTRAMUSCULAR | Status: DC | PRN
  Administered 2023-01-03: 100 ug via INTRAVENOUS

## 2023-01-03 MED ORDER — OXYCODONE HCL 5 MG/5ML PO SOLN: 5.0000 mg | Freq: Once | ORAL | Status: DC | PRN

## 2023-01-03 MED ORDER — INSULIN ASPART 100 UNIT/ML IJ SOLN: 0.0000 [IU] | INTRAMUSCULAR | Status: DC | PRN

## 2023-01-03 MED ORDER — SENNOSIDES-DOCUSATE SODIUM 8.6-50 MG PO TABS: 1.0000 | ORAL_TABLET | Freq: Every day | ORAL | Status: DC

## 2023-01-03 MED ORDER — PROPOFOL 500 MG/50ML IV EMUL
INTRAVENOUS | Status: DC | PRN
  Administered 2023-01-03: 100 ug/kg/min via INTRAVENOUS

## 2023-01-03 MED ORDER — FUROSEMIDE 10 MG/ML IJ SOLN
INTRAMUSCULAR | Status: AC
  Filled 2023-01-03: qty 4

## 2023-01-03 MED ORDER — PROPOFOL 10 MG/ML IV BOLUS
INTRAVENOUS | Status: DC | PRN
  Administered 2023-01-03: 170 mg via INTRAVENOUS
  Administered 2023-01-03: 10 mg via INTRAVENOUS

## 2023-01-03 MED ORDER — LACTATED RINGERS IV SOLN: INTRAVENOUS | Status: DC

## 2023-01-03 MED ORDER — LIDOCAINE 2% (20 MG/ML) 5 ML SYRINGE
INTRAMUSCULAR | Status: DC | PRN
  Administered 2023-01-03: 60 mg via INTRAVENOUS

## 2023-01-03 MED ORDER — PHENYLEPHRINE HCL-NACL 20-0.9 MG/250ML-% IV SOLN
INTRAVENOUS | Status: DC | PRN
  Administered 2023-01-03: 25 ug/min via INTRAVENOUS

## 2023-01-03 MED ORDER — OXYCODONE HCL 5 MG PO TABS: 5.0000 mg | ORAL_TABLET | Freq: Once | ORAL | Status: DC | PRN

## 2023-01-03 MED ORDER — PHENYLEPHRINE 80 MCG/ML (10ML) SYRINGE FOR IV PUSH (FOR BLOOD PRESSURE SUPPORT)
PREFILLED_SYRINGE | INTRAVENOUS | Status: DC | PRN
  Administered 2023-01-03 (×3): 80 ug via INTRAVENOUS

## 2023-01-03 MED ORDER — FUROSEMIDE 10 MG/ML IJ SOLN
40.0000 mg | Freq: Once | INTRAMUSCULAR | Status: AC
  Administered 2023-01-03: 40 mg via INTRAVENOUS

## 2023-01-03 MED ORDER — ONDANSETRON HCL 4 MG/2ML IJ SOLN
INTRAMUSCULAR | Status: DC | PRN
  Administered 2023-01-03: 4 mg via INTRAVENOUS

## 2023-01-03 MED ORDER — ONDANSETRON HCL 4 MG/2ML IJ SOLN: 4.0000 mg | Freq: Four times a day (QID) | INTRAMUSCULAR | Status: DC | PRN

## 2023-01-03 MED ORDER — DEXAMETHASONE SODIUM PHOSPHATE 10 MG/ML IJ SOLN
INTRAMUSCULAR | Status: DC | PRN
  Administered 2023-01-03: 10 mg via INTRAVENOUS

## 2023-01-03 MED ORDER — SUGAMMADEX SODIUM 200 MG/2ML IV SOLN
INTRAVENOUS | Status: DC | PRN
  Administered 2023-01-03: 200 mg via INTRAVENOUS

## 2023-01-03 MED ORDER — FENTANYL CITRATE (PF) 100 MCG/2ML IJ SOLN: 25.0000 ug | INTRAMUSCULAR | Status: DC | PRN

## 2023-01-03 MED ORDER — APIXABAN 5 MG PO TABS
5.0000 mg | ORAL_TABLET | Freq: Two times a day (BID) | ORAL | 1 refills | Status: DC
Start: 2023-01-03 — End: 2023-05-27

## 2023-01-03 MED ORDER — METOPROLOL SUCCINATE ER 50 MG PO TB24
50.0000 mg | ORAL_TABLET | Freq: Every evening | ORAL | Status: DC
Start: 2023-01-03 — End: 2023-05-02

## 2023-01-03 MED ORDER — CHLORHEXIDINE GLUCONATE 0.12 % MT SOLN
OROMUCOSAL | Status: AC
  Administered 2023-01-03: 15 mL
  Filled 2023-01-03: qty 15

## 2023-01-03 MED ORDER — ROCURONIUM BROMIDE 10 MG/ML (PF) SYRINGE
PREFILLED_SYRINGE | INTRAVENOUS | Status: DC | PRN
  Administered 2023-01-03: 20 mg via INTRAVENOUS
  Administered 2023-01-03: 60 mg via INTRAVENOUS

## 2023-01-03 SURGICAL SUPPLY — 1 items: SuperLock Fiducial Marker IMPLANT

## 2023-01-03 NOTE — Interval H&P Note (Signed)
History and Physical Interval Note:  01/03/2023 7:11 AM  Kevin [name]  has presented today for surgery, with the diagnosis of right upper lobe pulmonary nodule.  The various methods of treatment have been discussed with the patient and family. After consideration of risks, benefits and other options for treatment, the patient has consented to  Procedure(s): ROBOTIC ASSISTED NAVIGATIONAL BRONCHOSCOPY (N/A) as a surgical intervention.  The patient's history has been reviewed, patient examined, no change in status, stable for surgery.  I have reviewed the patient's chart and labs.  Questions were answered to the patient's satisfaction.     Leslye Peer

## 2023-01-03 NOTE — Anesthesia Procedure Notes (Signed)
Procedure Name: Intubation Date/Time: 01/03/2023 7:39 AM  Performed by: Nils Pyle, CRNAPre-anesthesia Checklist: Patient identified, Emergency Drugs available, Suction available and Patient being monitored Patient Re-evaluated:Patient Re-evaluated prior to induction Oxygen Delivery Method: Circle System Utilized Preoxygenation: Pre-oxygenation with 100% oxygen Induction Type: IV induction Ventilation: Mask ventilation without difficulty Laryngoscope Size: Miller and 2 Grade View: Grade I Tube type: Oral Tube size: 8.5 mm Number of attempts: 1 Airway Equipment and Method: Stylet and Oral airway Placement Confirmation: ETT inserted through vocal cords under direct vision, positive ETCO2 and breath sounds checked- equal and bilateral Secured at: 23 cm Tube secured with: Tape Dental Injury: Teeth and Oropharynx as per pre-operative assessment

## 2023-01-03 NOTE — Anesthesia Postprocedure Evaluation (Signed)
Anesthesia Post Note  Patient: [name]  Procedure(s) Performed: ROBOTIC ASSISTED NAVIGATIONAL BRONCHOSCOPY BRONCHIAL NEEDLE ASPIRATION BIOPSIES BRONCHIAL BRUSHINGS BRONCHIAL BIOPSIES FIDUCIAL MARKER PLACEMENT VIDEO BRONCHOSCOPY WITH RADIAL ENDOBRONCHIAL ULTRASOUND     Patient location during evaluation: PACU Anesthesia Type: General Level of consciousness: awake and alert Pain management: pain level controlled Vital Signs Assessment: post-procedure vital signs reviewed and stable Respiratory status: spontaneous breathing, nonlabored ventilation, respiratory function stable and patient connected to nasal cannula oxygen Cardiovascular status: blood pressure returned to baseline and stable Postop Assessment: no apparent nausea or vomiting Anesthetic complications: no   No notable events documented.  Last Vitals:  Vitals:   01/03/23 1100 01/03/23 1115  BP: 110/71 (!) 111/59  Pulse: 87 79  Resp: (!) 22 16  Temp: 36.4 C   SpO2: 92% 94%    Last Pain:  Vitals:   01/03/23 1100  TempSrc:   PainSc: 0-No pain                 Inanna Telford S

## 2023-01-03 NOTE — Transfer of Care (Signed)
Immediate Anesthesia Transfer of Care Note  Patient: Kevin [name]  Procedure(s) Performed: ROBOTIC ASSISTED NAVIGATIONAL BRONCHOSCOPY BRONCHIAL NEEDLE ASPIRATION BIOPSIES BRONCHIAL BRUSHINGS BRONCHIAL BIOPSIES FIDUCIAL MARKER PLACEMENT VIDEO BRONCHOSCOPY WITH RADIAL ENDOBRONCHIAL ULTRASOUND  Patient Location: PACU  Anesthesia Type:General  Level of Consciousness: awake, alert , and oriented  Airway & Oxygen Therapy: Patient Spontanous Breathing and Patient connected to face mask oxygen  Post-op Assessment: Report given to RN, Post -op Vital signs reviewed and stable, and Patient moving all extremities X 4  Post vital signs: Reviewed and stable  Last Vitals:  Vitals Value Taken Time  BP 106/59 01/03/23 0855  Temp    Pulse 71 01/03/23 0856  Resp 22 01/03/23 0856  SpO2 87 % 01/03/23 0856  Vitals shown include unvalidated device data.  Last Pain:  Vitals:   01/03/23 0618  TempSrc:   PainSc: 0-No pain         Complications: No notable events documented.

## 2023-01-03 NOTE — Op Note (Signed)
Video Bronchoscopy with Robotic Assisted Bronchoscopic Navigation   Date of Operation: 01/03/2023   Pre-op Diagnosis: Bilateral pulmonary nodules  Post-op Diagnosis: Same  Surgeon: Levy Pupaobert Laiylah Roettger  Assistants: None  Anesthesia: General endotracheal anesthesia  Operation: Flexible video fiberoptic bronchoscopy with robotic assistance and biopsies.  Estimated Blood Loss: Minimal  Complications: None  Indications and History: Molli KnockRichard Duane Ala is a 82 y.o. male with history of Tobacco use with COPD, UIP, atrial fibrillation and hypertension, CKD stage III.  We have been following pulmonary nodules with mild hypermetabolism on serial CT scans of the chest.  Recommendation made to achieve a tissue diagnosis via robotic assisted navigational bronchoscopy. The risks, benefits, complications, treatment options and expected outcomes were discussed with the patient.  The possibilities of pneumothorax, pneumonia, reaction to medication, pulmonary aspiration, perforation of a viscus, bleeding, failure to diagnose a condition and creating a complication requiring transfusion or operation were discussed with the patient who freely signed the consent.    Description of Procedure: The patient was seen in the Preoperative Area, was examined and was deemed appropriate to proceed.  [facility], identified as Molli Knockichard Duane Pineda and the procedure verified as Flexible Video Fiberoptic Bronchoscopy.  A Time Out was held and the above information confirmed.   Prior to the date of the procedure a high-resolution CT scan of the chest was performed. Utilizing ION software program a virtual tracheobronchial tree was generated to allow the creation of distinct navigation pathways to the patient's parenchymal abnormalities. After being taken to the operating room general anesthesia was initiated and the patient  was orally intubated. The video fiberoptic bronchoscope was introduced via the  endotracheal tube and a general inspection was performed which showed normal right and left lung anatomy. Aspiration of the bilateral mainstems was completed to remove any remaining secretions. Robotic catheter inserted into patient's endotracheal tube.   Target #1 right upper lobe pulmonary nodule: The distinct navigation pathways prepared prior to this procedure were then utilized to navigate to patient's lesion identified on CT scan. The robotic catheter was secured into place and the vision probe was withdrawn.  Lesion location was approximated using fluoroscopy and radial endobronchial ultrasound for peripheral targeting.  Local registration and targeting was performed using Cios three-dimensional imaging.  Under fluoroscopic guidance transbronchial needle brushings, transbronchial needle biopsies, and transbronchial forceps biopsies were performed to be sent for cytology and pathology.  Under fluoroscopic guidance a single fiducial marker was placed adjacent to the nodule.  Target #2 left upper lobe pulmonary nodule: The distinct navigation pathways prepared prior to this procedure were then utilized to navigate to patient's lesion identified on CT scan. The robotic catheter was secured into place and the vision probe was withdrawn.  Lesion location was approximated using fluoroscopy for peripheral targeting.  Local registration and targeting was performed using Cios three-dimensional imaging.  Under fluoroscopic guidance transbronchial brushings, transbronchial needle biopsies, and transbronchial forceps biopsies were performed to be sent for cytology and pathology.  Under fluoroscopic guidance a single fiducial marker was placed adjacent to the nodule.  At the end of the procedure a general airway inspection was performed and there was no evidence of active bleeding. The bronchoscope was removed.  The patient tolerated the procedure well. There was no significant blood loss and there were no obvious  complications. A post-procedural chest x-ray is pending.  Samples Target #1: 1. Transbronchial needle brushings from right upper lobe pulmonary nodule 2. Transbronchial Wang needle biopsies from  right upper lobe pulmonary nodule 3. Transbronchial forceps biopsies from right upper lobe pulmonary nodule  Samples Target #2: 1. Transbronchial brushings from left upper lobe pulmonary nodule 2. Transbronchial Wang needle biopsies from left upper lobe pulmonary nodule 3. Transbronchial forceps biopsies from left upper lobe pulmonary nodule   Plans:  The patient will be discharged from the PACU to home when recovered from anesthesia and after chest x-ray is reviewed. We will review the cytology, pathology and microbiology results with the patient when they become available. Outpatient followup will be with Dr. Delton Coombes.     Levy Pupa, MD, PhD 01/03/2023, 8:44 AM [facility] [phone] or if no answer before 7:00PM call [phone] For any issues after 7:00PM please call eLink [phone]

## 2023-01-03 NOTE — Discharge Instructions (Signed)
Flexible Bronchoscopy, Care After This sheet gives you information about how to care for yourself after your test. Your doctor may also give you more specific instructions. If you have problems or questions, contact your doctor. Follow these instructions at home: Eating and drinking When your numbness is gone and your cough and gag reflexes have come back, you may: Eat only soft foods. Slowly drink liquids. The day after the test, go back to your normal diet. Driving Do not drive for 24 hours if you were given a medicine to help you relax (sedative). Do not drive or use heavy machinery while taking prescription pain medicine. General instructions  Take over-the-counter and prescription medicines only as told by your doctor. Return to your normal activities as told. Ask what activities are safe for you. Do not use any products that have nicotine or tobacco in them. This includes cigarettes and e-cigarettes. If you need help quitting, ask your doctor. Keep all follow-up visits as told by your doctor. This is important. It is very important if you had a tissue sample (biopsy) taken. Get help right away if: You have shortness of breath that gets worse. You get light-headed. You feel like you are going to pass out (faint). You have chest pain. You cough up: More than a little blood. More blood than before. Summary Do not eat or drink anything (not even water) for 2 hours after your test, or until your numbing medicine wears off. Do not use cigarettes. Do not use e-cigarettes. Get help right away if you have chest pain.  Please call our office for any questions or concerns.  [phone]  You can restart your Eliquis with your evening dose on 01/04/2023  This information is not intended to replace advice given to you by your health care provider. Make sure you discuss any questions you have with your health care provider. Document Released: 07/11/2009 Document Revised: 08/26/2017 Document  Reviewed: 10/01/2016 Elsevier Patient Education  2020 [employer].

## 2023-01-04 LAB — CYTOLOGY - NON PAP

## 2023-01-05 ENCOUNTER — Telehealth: Payer: Self-pay | Admitting: Emergency Medicine

## 2023-01-05 DIAGNOSIS — C3490 Malignant neoplasm of unspecified part of unspecified bronchus or lung: Secondary | ICD-10-CM

## 2023-01-05 LAB — CYTOLOGY - NON PAP

## 2023-01-05 NOTE — Telephone Encounter (Signed)
Discussed bronchoscopy results with the patient by phone.  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  We will obtain a PET scan now to start staging process.  I will refer him to see Dr. Arbutus Ped with oncology.

## 2023-01-07 ENCOUNTER — Encounter (HOSPITAL_COMMUNITY): Payer: Self-pay | Admitting: Emergency Medicine

## 2023-01-10 NOTE — Progress Notes (Unsigned)
[facility] [phone]   [phone]  CONSULT NOTE  REFERRING PHYSICIAN: Dr. Delton Coombes   REASON FOR CONSULTATION:  Non-small cell lung cancer, adenocarcinoma  HPI Tomasz Steeves is a 82 y.o. 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   The patient was recently hospitalized from 12/07/22-12/13/22 for the chief complaint of dyspnea, hypoxia, and tachycardia. He was hospitalized for his atrial fibrillation. During his hospitalization, a CT chest showed persistent RUL pulmonary nodule which he had been following/monitoring closely outpatient with Dr. Delton Coombes from pulmonary medicine. He has been following with Dr. Delton Coombes since 2022.   The patient followed up outpatient with Dr. Delton Coombes on 12/17/2022. He had a initial PET scan on 01/21/21 showing RUL hypermetabolism and also hypermetabolism in LUL suspicious for synchronous primary bronchiogenic carcinoma. His CT scan from 04/28/2022 showed persistent 1.8 x 1.2 cm spiculated right upper lobe pulmonary nodule and a 2.2 x 0.8 cm elongated left upper lobe nodule.  CT scan from 11/03/2022 showed continued increase in the spiculated right upper lobe nodule now measuring 2 x 1.3 x 2.1 cm and the other pulmonary nodules are unchanged including the posterior left upper lobe and superior segment of the left lower lobe.  At baseline, the patient is on 3 L of supplemental oxygen. He left his oxygen tank in the car and his oxygen was 80% on RA in the clinic today. This improved to 91% with oxygen.   Dr. Delton Coombes arrange for navigational bronchoscopy to evaluate the lung nodules.  This was performed on 01/03/2023.  The final pathology ([id number]) of the right upper lobe and the left upper lobe showed non-small cell lung  cancer, adenocarcinoma.  Dr. Delton Coombes feels that given the slow change on serial imaging will be consistent with 2 synchronous stage I primaries.  The PET scan is scheduled for 01/27/2023.  He is referred to our clinic for further evaluation recommendations regarding this.  Overall, the patient is feeling fine today.  He continues to have similar shortness of breath secondary to his atrial fibrillation which is similar for when he was discharged from the hospital last month.  He is scheduled to see someone from the atrial fibrillation clinic on 01/26/2023.  He is looking forward to this.  He had a cardioversion in the past which helped his atrial fibrillation for about a day and a half in which he had improved shortness of breath at that time.  Otherwise he denies any fever, chills, night sweats, or unexplained weight loss.  Denies any chest pain.  He has a chronic cough secondary to postnasal drainage which is been ongoing for several years.  He denies any changes with this.  He had some mild hemoptysis after his bronchoscopy but this has dissipated.  Denies any nausea, vomiting, diarrhea, or constipation.  Denies any headache or visual changes.  The patient's mother had congestive heart failure, Hodgkin's lymphoma, and CKD.  The patient's father had colon cancer.  The patient's sister passed away secondary to a car accident.  He had a brother who has what sounds like could possibly be testicular cancer but that is not confirmed.  The patient is retired and worked as a Production manager.  He is married and accompanied by his wife today.  He does not  have any children.  He quit smoking 40 years ago.  He drinks less than 1 sixpack on Friday and Saturdays.  Denies any drug use.   HPI  Past Medical History:  Diagnosis Date   A-fib 03/20/2018   Acute blood loss anemia    Anxiety    Benign prostatic hyperplasia with urinary retention    Cerebrovascular accident (CVA)    CHF (congestive heart failure)     COPD (chronic obstructive pulmonary disease)    Diabetes mellitus type 2 in nonobese    Dyspnea    Dysrhythmia    A-Fib   Essential hypertension 03/20/2018   Former tobacco use    H/O ulcerative colitis 1993   Leukocytosis    Osteomyelitis    a. 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   PAF (paroxysmal atrial fibrillation)    a. dx 03/2018 in setting of stroke, osteomyelitis.   Stage 3 chronic kidney disease    pt/family unaware   Status post transmetatarsal amputation of left foot    Umbilical hernia     Past Surgical History:  Procedure Laterality Date   AMPUTATION Left 03/24/2018   Procedure: Transmetatarsal Amputation Left Foot;  Surgeon: Nadara Mustard, MD;  Location: [facility] OR;  Service: Orthopedics;  Laterality: Left;   BRONCHIAL BIOPSY  01/03/2023   Procedure: BRONCHIAL BIOPSIES;  Surgeon: Leslye Peer, MD;  Location: [facility] ENDOSCOPY;  Service: Pulmonary;;   BRONCHIAL BRUSHINGS  01/03/2023   Procedure: BRONCHIAL BRUSHINGS;  Surgeon: Leslye Peer, MD;  Location: [facility] ENDOSCOPY;  Service: Pulmonary;;   BRONCHIAL NEEDLE ASPIRATION BIOPSY  01/03/2023   Procedure: BRONCHIAL NEEDLE ASPIRATION BIOPSIES;  Surgeon: Leslye Peer, MD;  Location: [facility] ENDOSCOPY;  Service: Pulmonary;;   CARDIOVERSION N/A 05/05/2018   Procedure: CARDIOVERSION;  Surgeon: Chilton Si, MD;  Location: [facility] ENDOSCOPY;  Service: Cardiovascular;  Laterality: N/A;   CARDIOVERSION N/A 12/02/2022   Procedure: CARDIOVERSION;  Surgeon: Jodelle Red, MD;  Location: [facility] ENDOSCOPY;  Service: Cardiovascular;  Laterality: N/A;   COLONOSCOPY  12/2017   benign, remission from ulcerative colitis in 90s   FIDUCIAL MARKER PLACEMENT  01/03/2023   Procedure: FIDUCIAL MARKER PLACEMENT;  Surgeon: Leslye Peer, MD;  Location: [facility] ENDOSCOPY;  Service: Pulmonary;;   I & D EXTREMITY Left 03/17/2018   Procedure: IRRIGATION AND DEBRIDEMENT FOOT;  Surgeon: Yolonda Kida, MD;  Location: [facility] OR;  Service: Orthopedics;  Laterality:  Left;   STUMP REVISION Left 05/10/2018   Procedure: REVISION LEFT TRANSMETATARSAL AMPUTATION;  Surgeon: Nadara Mustard, MD;  Location: [facility] OR;  Service: Orthopedics;  Laterality: Left;   VIDEO BRONCHOSCOPY WITH RADIAL ENDOBRONCHIAL ULTRASOUND  01/03/2023   Procedure: VIDEO BRONCHOSCOPY WITH RADIAL ENDOBRONCHIAL ULTRASOUND;  Surgeon: Leslye Peer, MD;  Location: [facility] ENDOSCOPY;  Service: Pulmonary;;    Family History  Problem Relation Age of Onset   Hypertension Father     Social History Social History   Tobacco Use   Smoking status: Former    Packs/day: 1.00    Years: 20.00    Additional pack years: 0.00    Total pack years: 20.00    Types: Cigarettes    Start date: 1963    Quit date: 1985    Years since quitting: 39.3   Smokeless tobacco: Never  Vaping Use   Vaping Use: Never used  Substance Use Topics   Alcohol use: Yes    Alcohol/week: 4.0 standard drinks of alcohol    Types: 4 Cans of beer per week    Comment: social  Drug use: Never    No Known Allergies  Current Outpatient Medications  Medication Sig Dispense Refill   acetaminophen (TYLENOL) 500 MG tablet Take 1,000-1,500 mg by mouth every 8 (eight) hours as needed for moderate pain.     albuterol (VENTOLIN HFA) 108 (90 Base) MCG/ACT inhaler Inhale 2 puffs into the lungs every 6 (six) hours as needed for wheezing or shortness of breath. 24 g 1   apixaban (ELIQUIS) 5 MG TABS tablet Take 1 tablet (5 mg total) by mouth 2 (two) times daily. Okay to restart this medication with your evening dose on 01/04/2023 180 tablet 1   diltiazem (CARDIZEM CD) 120 MG 24 hr capsule Take 1 capsule (120 mg total) by mouth daily. Take at breakfast. 90 capsule 1   escitalopram (LEXAPRO) 10 MG tablet Take 10 mg by mouth daily.     insulin aspart protamine - aspart (NOVOLOG 70/30 MIX) (70-30) 100 UNIT/ML FlexPen Inject 27 Units into the skin 2 (two) times daily with a meal. 15 mL 11   Insulin Pen Needle (PEN NEEDLES 3/16") 31G X 5 MM MISC 1  Pen by Does not apply route 2 (two) times daily before a meal. Before breakfast and dinner 100 each 1   loratadine (CLARITIN) 10 MG tablet Take 10 mg by mouth at bedtime.     melatonin 3 MG TABS tablet Take 6 mg by mouth at bedtime.     mesalamine (LIALDA) 1.2 g EC tablet Take 1.2 g by mouth daily.     metoprolol succinate (TOPROL-XL) 50 MG 24 hr tablet Take 1 tablet (50 mg total) by mouth every evening.     Multiple Vitamins-Minerals (PRESERVISION AREDS 2 PO) Take 1 capsule by mouth 2 (two) times daily.     rosuvastatin (CRESTOR) 10 MG tablet Take 0.5 tablets (5 mg total) by mouth daily. 45 tablet 1   senna-docusate (SENOKOT-S) 8.6-50 MG tablet Take 1 tablet by mouth at bedtime.     tamsulosin (FLOMAX) 0.4 MG CAPS capsule Take 1 capsule (0.4 mg total) by mouth 2 times daily at 12 noon and 4 pm. (Patient taking differently: Take 0.4 mg by mouth in the morning and at bedtime.) 60 capsule 0   Tiotropium Bromide-Olodaterol (STIOLTO RESPIMAT) 2.5-2.5 MCG/ACT AERS Inhale 2 puffs into the lungs daily. 4 g 11   trimethoprim (TRIMPEX) 100 MG tablet Take 1 tablet (100 mg total) by mouth at bedtime.  11   No current facility-administered medications for this visit.    REVIEW OF SYSTEMS:   Review of Systems  Constitutional: Negative for appetite change, chills, fatigue, fever and unexpected weight change.  HENT:   Negative for mouth sores, nosebleeds, sore throat and trouble swallowing.   Eyes: Negative for eye problems and icterus.  Respiratory: Positive for stable chronic cough.  Stable dyspnea on exertion.  Negative for hemoptysis, and wheezing.   Cardiovascular: Negative for chest pain and leg swelling.  Gastrointestinal: Negative for abdominal pain, constipation, diarrhea, nausea and vomiting.  Genitourinary: Negative for bladder incontinence, difficulty urinating, dysuria, frequency and hematuria.   Musculoskeletal: Negative for back pain, gait problem, neck pain and neck stiffness.  Skin:  Negative for itching and rash.  Neurological: Negative for dizziness, extremity weakness, gait problem, headaches, light-headedness and seizures.  Hematological: Negative for adenopathy. Does not bruise/bleed easily.  Psychiatric/Behavioral: Negative for confusion, depression and sleep disturbance. The patient is not nervous/anxious.     PHYSICAL EXAMINATION:  There were no vitals taken for this visit.  ECOG PERFORMANCE STATUS: 2-3  Physical Exam  Constitutional: Oriented to person, place, and time and well-developed, well-nourished, and in no distress. HENT:  Head: Normocephalic and atraumatic.  Mouth/Throat: Oropharynx is clear and moist. No oropharyngeal exudate.  Eyes: Conjunctivae are normal. Right eye exhibits no discharge. Left eye exhibits no discharge. No scleral icterus.  Neck: Normal range of motion. Neck supple.  Cardiovascular: Normal rate, irregular rhythm, normal heart sounds and intact distal pulses.   Pulmonary/Chest: Effort normal.  Quiet breath sounds bilaterally.  On 3 L of supplemental oxygen no respiratory distress. No wheezes. No rales.  Positive for hernia. Abdominal: Soft. Bowel sounds are normal. Exhibits no distension and no mass. There is no tenderness.  Musculoskeletal: Normal range of motion. Exhibits no edema.  Lymphadenopathy:    No cervical adenopathy.  Neurological: Alert and oriented to person, place, and time. Exhibits normal muscle tone.  Examined in the wheelchair. Skin: Skin is warm and dry. No rash noted. Not diaphoretic. No erythema. No pallor.  Psychiatric: Mood, memory and judgment normal.  Vitals reviewed.  LABORATORY DATA: Lab Results  Component Value Date   WBC 9.4 01/03/2023   HGB 12.0 (L) 01/03/2023   HCT 36.5 (L) 01/03/2023   MCV 89.0 01/03/2023   PLT 231 01/03/2023      Chemistry      Component Value Date/Time   NA 138 01/03/2023 0648   NA 138 11/25/2022 1213   K 4.3 01/03/2023 0648   CL 103 01/03/2023 0648   CO2 23  01/03/2023 0648   BUN 27 (H) 01/03/2023 0648   BUN 32 (H) 11/25/2022 1213   CREATININE 1.36 (H) 01/03/2023 0648      Component Value Date/Time   CALCIUM 8.6 (L) 01/03/2023 0648   ALKPHOS 129 (H) 12/13/2022 0237   AST 72 (H) 12/13/2022 0237   ALT 78 (H) 12/13/2022 0237   BILITOT 0.6 12/13/2022 0237       RADIOGRAPHIC STUDIES: DG Chest Port 1 View  Result Date: 01/03/2023 CLINICAL DATA:  Status post bronchoscopy EXAM: PORTABLE CHEST 1 VIEW COMPARISON:  12/07/2022. FINDINGS: Diffuse pulmonary interstitial prominence consistent with pulmonary edema. Enlarged cardiac silhouette. Irregular masslike process with a metallic clip right upper lung appears larger than on the prior study measuring about 3 cm compared to about 2 cm previously. This interval change could represent reaction to biopsy in surrounding parenchyma. Aorta is tortuous and calcified. No pneumothorax or pleural effusion identified. IMPRESSION: Findings suggest worsening CHF. Right upper lung nodular spiculation increased in size, possibly on the basis of post biopsy changes. Electronically Signed   By: Layla Maw M.D.   On: 01/03/2023 09:25   DG C-ARM BRONCHOSCOPY  Result Date: 01/03/2023 C-ARM BRONCHOSCOPY: Fluoroscopy was utilized by the requesting physician.  No radiographic interpretation.   CT RENAL STONE STUDY  Result Date: 12/11/2022 CLINICAL DATA:  Severe hydronephrosis. EXAM: CT ABDOMEN AND PELVIS WITHOUT CONTRAST TECHNIQUE: Multidetector CT imaging of the abdomen and pelvis was performed following the standard protocol without IV contrast. RADIATION DOSE REDUCTION: This exam was performed according to the departmental dose-optimization program which includes automated exposure control, adjustment of the mA and/or kV according to patient size and/or use of iterative reconstruction technique. COMPARISON:  01/05/2021. FINDINGS: Lower chest: Coronary artery calcifications are noted. Atelectasis and fibrotic changes are  noted at the lung bases. Hepatobiliary: Hypodensity is noted in the left lobe of the liver, most likely cyst or hemangioma. No biliary ductal dilatation. The gallbladder is without stones. Pancreas: Unremarkable. No pancreatic ductal dilatation or surrounding inflammatory  changes. Spleen: Normal in size without focal abnormality. Adrenals/Urinary Tract: There is a 1.8 cm nodule in the right adrenal gland with attenuation of 8 Hounsfield units, compatible with adenoma. The left adrenal gland is within normal limits. No renal or ureteral calculus bilaterally. There is moderate to severe hydroureteronephrosis with thickening of the walls of the urothelium. Perinephric fat stranding is noted bilaterally. There is diffuse bladder wall thickening. A Foley catheter is present in the urinary bladder. Stomach/Bowel: Stomach is within normal limits. Appendix appears normal. No evidence of bowel wall thickening, distention, or inflammatory changes. No free air or pneumatosis. Vascular/Lymphatic: Aortic atherosclerosis. No enlarged abdominal or pelvic lymph nodes. Reproductive: The prostate gland is enlarged. Other: No abdominopelvic ascites. A fat containing umbilical hernia is present. Musculoskeletal: Degenerative changes are present in the thoracolumbar spine. There is a stable compression deformity at L1. No acute osseous abnormality. IMPRESSION: 1. Moderate-to-severe hydroureteronephrosis bilaterally with no evidence of obstructing calculus. Urothelial thickening is noted bilaterally in the possibility of superimposed infection can not be excluded. 2. Diffuse bladder wall thickening with Foley catheter in place, compatible with cystitis. 3. Enlarged prostate gland. 4. Right adrenal adenoma. 5. Aortic atherosclerosis and coronary artery calcifications Electronically Signed   By: Thornell Sartorius M.D.   On: 12/11/2022 20:33    ASSESSMENT: This is a very pleasant 82 year old Caucasian male referred to the clinic for what is  felt to be 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  The patient presented with a right upper lobe and left upper lobe pulmonary nodules.  He also has some smaller pulmonary nodules which will need to be monitored closely on future imaging studies.  He was diagnosed in April 2024.  We will request PD-L1 and foundation 1 testing.  The patient was seen with Dr. Arbutus Ped today.  Dr. Arbutus Ped had lengthy discussion with the patient today about his current condition and recommended treatment options.  Dr. Arbutus Ped recommends treating both of the lung lesions with radiation.  However, Dr. Arbutus Ped discussed that we cannot rule out that the other smaller lung nodules are also cancerous.  We will need to monitor these closely on future imaging studies.  If these continue to enlarge and are found to be consistent with cancer, we are running molecular studies to see if the patient is a candidate for any targeted treatment.    The patient is likely not a surgical candidate given that he is oxygen dependent and comorbidities.  Patient will have his staging PET scan on 01/27/2023 as scheduled.  I have ordered a brain MRI to complete the staging workup.  We will see the patient back for follow-up visit in 4 months (approximately 3 months after radiation) for surveillance CT scan of the chest without contrast.  I am ordering this without contrast due to his CKD.  I have placed a referral to radiation oncology.  We discussed the importance of always having his oxygen tank with him and bring his portable oxygen tank in the car.  The patient voices understanding of current disease status and treatment options and is in agreement with the current care plan.  All questions were answered. The patient knows to call the clinic with any problems, questions or concerns. We can certainly see the patient much sooner if necessary.  Thank you so much for allowing me to participate in the care of  [name]. I will continue to follow up the patient with you and assist in his care.   Disclaimer:  This note was dictated with voice recognition software. Similar sounding words can inadvertently be transcribed and may not be corrected upon review.   Reeves Musick L Talik Casique January 10, 2023, 11:56 AM  ADDENDUM: Hematology/Oncology Attending:  I had the history of his encounter with the patient today.  I reviewed his record, lab, scan and recommended his care plan.  This is a very pleasant 82 years old white male with history of COPD and chronic hypoxia on home oxygen, diastolic congestive heart failure, chronic kidney disease, atrial fibrillation, sepsis, ulcerative colitis, type 2 diabetes mellitus, benign prostatic hypertrophy, history of a stroke, hypertension and dyslipidemia as well as partial amputation of the left foot.  The patient has been followed by Dr. Delton Coombes for bilateral pulmonary nodules for several months.  He was recently in the hospital in March 2024 and repeat imaging studies showed persistent right upper lobe pulmonary nodule in addition to left upper lobe pulmonary nodule and there was concern about malignancy.  Dr. Delton Coombes was consulted and he proceeded with bronchoscopy and biopsy of the bilateral pulmonary nodules.  The final pathology was consistent with adenocarcinoma of the lung. The patient was referred to [place] today for evaluation and recommendation regarding his condition.  He has a PET scan scheduled in around 2 weeks. I had a lengthy discussion with the patient and his wife today about his current condition and treatment options. The patient definitely is not a good surgical candidate because of his condition. I recommended for him to see radiation oncology for consideration of SBRT to the bilateral pulmonary nodules close monitoring of the other smaller nodules. We will also send his tissue block for molecular studies and PD-L1 expression. We will complete the  staging workup by ordering MRI of the brain to rule out brain metastasis. We will arrange for the patient to come back for follow-up visit in 4 months for evaluation and repeat CT scan of the chest for evaluation of his disease after the SBRT. The patient was advised to call immediately if he has any other concerning symptoms in the interval. The total time spent in the appointment was 60 minutes. Disclaimer: This note was dictated with voice recognition software. Similar sounding words can inadvertently be transcribed and may be missed upon review. Lajuana Matte, MD

## 2023-01-11 ENCOUNTER — Inpatient Hospital Stay: Payer: Medicare Other

## 2023-01-11 ENCOUNTER — Other Ambulatory Visit: Payer: Self-pay

## 2023-01-11 ENCOUNTER — Telehealth: Payer: Self-pay | Admitting: Radiation Oncology

## 2023-01-11 ENCOUNTER — Inpatient Hospital Stay: Payer: Medicare Other | Attending: Physician Assistant | Admitting: Physician Assistant

## 2023-01-11 VITALS — BP 124/65 | HR 84 | Temp 98.0°F | Resp 20 | Ht 74.0 in | Wt 240.9 lb

## 2023-01-11 DIAGNOSIS — K5190 Ulcerative colitis, unspecified, without complications: Secondary | ICD-10-CM

## 2023-01-11 DIAGNOSIS — C3411 Malignant neoplasm of upper lobe, right bronchus or lung: Secondary | ICD-10-CM

## 2023-01-11 DIAGNOSIS — Z8673 Personal history of transient ischemic attack (TIA), and cerebral infarction without residual deficits: Secondary | ICD-10-CM

## 2023-01-11 DIAGNOSIS — I5030 Unspecified diastolic (congestive) heart failure: Secondary | ICD-10-CM | POA: Diagnosis not present

## 2023-01-11 DIAGNOSIS — C3412 Malignant neoplasm of upper lobe, left bronchus or lung: Secondary | ICD-10-CM

## 2023-01-11 DIAGNOSIS — C3480 Malignant neoplasm of overlapping sites of unspecified bronchus and lung: Secondary | ICD-10-CM

## 2023-01-11 DIAGNOSIS — N189 Chronic kidney disease, unspecified: Secondary | ICD-10-CM

## 2023-01-11 DIAGNOSIS — N400 Enlarged prostate without lower urinary tract symptoms: Secondary | ICD-10-CM

## 2023-01-11 DIAGNOSIS — J449 Chronic obstructive pulmonary disease, unspecified: Secondary | ICD-10-CM

## 2023-01-11 DIAGNOSIS — E119 Type 2 diabetes mellitus without complications: Secondary | ICD-10-CM

## 2023-01-11 DIAGNOSIS — C3490 Malignant neoplasm of unspecified part of unspecified bronchus or lung: Secondary | ICD-10-CM | POA: Insufficient documentation

## 2023-01-11 DIAGNOSIS — E785 Hyperlipidemia, unspecified: Secondary | ICD-10-CM | POA: Diagnosis not present

## 2023-01-11 NOTE — Progress Notes (Signed)
NN met with pt face to face today at his initial consult with Dr.Mohamed. Pt was accompanied by his wife, Misty Stanley. Plan for the pt is for him to see Rad Onc (referral placed). Have his tissue send to [facility] for molecular studies and PD-L1 (request emailed to Cydney at [facility] pathology), and receive a brain MRI (order placed). Pt has a PET scan scheduled for 5/2, however will attempt to move that to an earlier date. Pts only concern at this time is that his cardiologist would like to admit him for treatment with Tikosyn (see note from 4/1), and wants to know how to incorporate his cardiologist's plan with treating his cancer. NN informed pt and his wife that he will be discussed at our tumor board on 4/18 and NN will bring it to the attention of the providers during conference.  NN provided pt and his wife with her card and encouraged calling with any questions/concerns.

## 2023-01-11 NOTE — Patient Instructions (Signed)
It was nice meeting you today.  I know we covered a lot of important information.  Here is the main take away points -Dr. Delton Coombes took a sample/biopsy of the right upper lobe lung nodule and the left upper lobe lung nodule.  Both of these appear to be a type of lung cancer called non-small cell lung cancer, adenocarcinoma. -You do have a couple other smaller nodules in the lung that cannot be biopsied and are too small to light up on the PET scan.  We will have to monitor these closely in the future to make sure they do not enlarge and are not also cancer related. -As of right now, the plan is to treat you as if these are 2 separate lung cancers that appeared at the same time as opposed to one of them spreading to the other lung.  Therefore, we will refer you to radiation oncology.  They are located in the basement of this building.  They will talk to you for consultation to discuss whether they are in agreement to radiate both of these spots separately.  It is unlikely that you would be able to undergo surgery given the underlying lung disease and heart problems. -Please keep the appointment for the PET scan as scheduled on 01/27/2023.  As long as this does not show any other areas of concern, we will continue to plan on radiation to both of the sites. -We also require a brain MRI to complete the staging workup.  This may be arranged for on a different day than the PET scan.  We just want to ensure that there is no spread of the cancer to the brain since lung cancer does like to go to the brain -We are also can you run some special test on the sample that Dr. Delton Coombes took from your lung.  About 20% of patients with adenocarcinoma may have a mutation that we have treatments for that are pills.  Should you ever require any additional treatment in the future, it is good to have this information so we know what treatments you are candidate for. -We will see you back for follow-up visit in approximately 4 months from  now.  A few days before your follow-up with [place], we would like you to have a repeat CT scan of the chest to make sure the radiation treatment was effective.  They should call you to schedule your CT scan the chest sometime over the summer closer to August.  Please be on the look out for phone call from the scanning department to schedule this in a few months. -If you have any questions, always feel free to call the clinic sooner.  Our number is [phone]

## 2023-01-11 NOTE — Telephone Encounter (Signed)
LVM to schedule CON with Dr. Moody 

## 2023-01-12 ENCOUNTER — Telehealth: Payer: Self-pay | Admitting: Radiation Oncology

## 2023-01-12 NOTE — Progress Notes (Signed)
NN contacted central scheduling to arrange for pt's Brain MRI and CT scan prior to his next appt with Cassie in August.  MRI brain scheduled for 7:00pm on 4/23 at [facility] CT Chest scheduled for 10:15am on 8/19 at [facility]  NN reached out to pts wife, Byrd Hesselbach, to inform her of the imaging appointments. Byrd Hesselbach understands the pt needs to be at [facility] at 6:30pm on 4/23 for the patient's MRI and they need to enter the hospital via the ER entrance. NN reminded pt that his appts will also be visible on MyChart.  Byrd Hesselbach denies questions or concerns about pts imaging at this time.

## 2023-01-12 NOTE — Telephone Encounter (Signed)
LVM to schedule CON with Dr. Moody 

## 2023-01-13 ENCOUNTER — Other Ambulatory Visit: Payer: Self-pay

## 2023-01-13 ENCOUNTER — Telehealth: Payer: Self-pay | Admitting: Radiation Oncology

## 2023-01-13 ENCOUNTER — Ambulatory Visit (INDEPENDENT_AMBULATORY_CARE_PROVIDER_SITE_OTHER): Payer: Medicare Other | Admitting: Orthopedic Surgery

## 2023-01-13 DIAGNOSIS — L97521 Non-pressure chronic ulcer of other part of left foot limited to breakdown of skin: Secondary | ICD-10-CM | POA: Diagnosis not present

## 2023-01-13 NOTE — Telephone Encounter (Signed)
Pt's wife returned call and scheduled CON with Dr. Mitzi Hansen. She also requested that her number be made the main contact since she schedules all his appts. Change made to appt desk.

## 2023-01-13 NOTE — Telephone Encounter (Signed)
LVM on pt's number and spouse's number to please contact our office to schedule CON with Dr. Mitzi Hansen asap.

## 2023-01-13 NOTE — Progress Notes (Signed)
The proposed treatment discussed in conference is for discussion purpose only and is not a binding recommendation.  The patients have not been physically examined, or presented with their treatment options.  Therefore, final treatment plans cannot be decided.  

## 2023-01-17 NOTE — Progress Notes (Signed)
Radiation Oncology         [phone] ________________________________  Name: Kevin [name]        MRN: [id number]  Date of Service: 01/20/2023 DOB: 12-Nov-1940  CC:Blair Heys, MD  Si Gaul, MD     REFERRING PHYSICIAN: Si Gaul, MD   DIAGNOSIS: The primary encounter diagnosis was Malignant neoplasm of overlapping sites of lung, unspecified laterality (HCC). A diagnosis of Adenocarcinoma of upper lobe of right lung [facility]) was also pertinent to this visit.   HISTORY OF PRESENT ILLNESS: Kevin [name] is a 82 y.o. male seen at the request of Dr. Arbutus Ped for a diagnosis of lung cancer.  He had been followed by pulmonary medicine after COVID for dyspnea and a CT in March 2022 showed a new nodule in the right upper lobe measuring 1.5 cm with an adjacent 1.1 cm anterior right upper lobe nodule.  Pet imaging in April 2022 showed hypermetabolic activity in the pleural-based right upper lobe nodule with an SUV of 2.9 and similar changes in the posterior aspect with hypermetabolic activity of 2.9.  This was followed closely and had increased in size mildly in January 2023.  His scan was repeated in August 2023 and was stable to slightly enlarged measuring 1.8 cm in the periphery and a left upper lobe nodule was also noted measuring 2.2 cm.  In February 2024 the right upper lobe nodule measured 2 cm, and the left upper lobe nodule measured 1.9 cm.  He was hospitalized with atrial fibrillation in mid March 2024 and during this time a CT scan showed persistence in the right upper lobe nodule measuring 2 cm, the left upper lobe nodule was stable at 1.8 cm and a subpleural lymph node about the left major fissure measuring 7 mm was identified.  He was counseled on the rationale for bronchoscopy which was performed on 01/03/2023, the right upper lobe target fine-needle aspirate and brushings showed non-small cell lung cancer consistent with an adenocarcinoma.  The left upper lobe target  was also sampled and brushings showed non-small cell carcinoma but scant specimen could not further clarify and to the second aspirate of the left upper lobe showed atypical cells suspicious for carcinoma.  He is scheduled to undergo a PET scan on 01/27/2023.  An MRI on 01/18/2023 showed no evidence of metastatic disease.  It is favored that these 2 nodules the right upper lobe and the left upper lobe are both synchronous stage I cancers.  Given this finding he is seen to consider stereotactic body radiotherapy.    PREVIOUS RADIATION THERAPY: No   PAST MEDICAL HISTORY:  Past Medical History:  Diagnosis Date   A-fib (HCC) 03/20/2018   Acute blood loss anemia    Anxiety    Benign prostatic hyperplasia with urinary retention    Cerebrovascular accident (CVA) (HCC)    CHF (congestive heart failure) (HCC)    COPD (chronic obstructive pulmonary disease) (HCC)    Diabetes mellitus type 2 in nonobese (HCC)    Dyspnea    Dysrhythmia    A-Fib   Essential hypertension 03/20/2018   Former tobacco use    H/O ulcerative colitis 1993   Leukocytosis    Osteomyelitis (HCC)    a. 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   PAF (paroxysmal atrial fibrillation) (HCC)    a. dx 03/2018 in setting of stroke, osteomyelitis.   Stage 3 chronic kidney disease (HCC)    pt/family unaware   Status post transmetatarsal amputation of left foot (HCC)  Umbilical hernia        PAST SURGICAL HISTORY: Past Surgical History:  Procedure Laterality Date   AMPUTATION Left 03/24/2018   Procedure: Transmetatarsal Amputation Left Foot;  Surgeon: Nadara Mustard, MD;  Location: [facility] OR;  Service: Orthopedics;  Laterality: Left;   BRONCHIAL BIOPSY  01/03/2023   Procedure: BRONCHIAL BIOPSIES;  Surgeon: Leslye Peer, MD;  Location: [facility] ENDOSCOPY;  Service: Pulmonary;;   BRONCHIAL BRUSHINGS  01/03/2023   Procedure: BRONCHIAL BRUSHINGS;  Surgeon: Leslye Peer, MD;  Location: [facility] ENDOSCOPY;  Service: Pulmonary;;   BRONCHIAL NEEDLE  ASPIRATION BIOPSY  01/03/2023   Procedure: BRONCHIAL NEEDLE ASPIRATION BIOPSIES;  Surgeon: Leslye Peer, MD;  Location: [facility] ENDOSCOPY;  Service: Pulmonary;;   CARDIOVERSION N/A 05/05/2018   Procedure: CARDIOVERSION;  Surgeon: Chilton Si, MD;  Location: [facility] ENDOSCOPY;  Service: Cardiovascular;  Laterality: N/A;   CARDIOVERSION N/A 12/02/2022   Procedure: CARDIOVERSION;  Surgeon: Jodelle Red, MD;  Location: [facility] ENDOSCOPY;  Service: Cardiovascular;  Laterality: N/A;   COLONOSCOPY  12/2017   benign, remission from ulcerative colitis in 90s   FIDUCIAL MARKER PLACEMENT  01/03/2023   Procedure: FIDUCIAL MARKER PLACEMENT;  Surgeon: Leslye Peer, MD;  Location: [facility] ENDOSCOPY;  Service: Pulmonary;;   I & D EXTREMITY Left 03/17/2018   Procedure: IRRIGATION AND DEBRIDEMENT FOOT;  Surgeon: Yolonda Kida, MD;  Location: [facility] OR;  Service: Orthopedics;  Laterality: Left;   STUMP REVISION Left 05/10/2018   Procedure: REVISION LEFT TRANSMETATARSAL AMPUTATION;  Surgeon: Nadara Mustard, MD;  Location: [facility] OR;  Service: Orthopedics;  Laterality: Left;   VIDEO BRONCHOSCOPY WITH RADIAL ENDOBRONCHIAL ULTRASOUND  01/03/2023   Procedure: VIDEO BRONCHOSCOPY WITH RADIAL ENDOBRONCHIAL ULTRASOUND;  Surgeon: Leslye Peer, MD;  Location: [facility] ENDOSCOPY;  Service: Pulmonary;;     FAMILY HISTORY:  Family History  Problem Relation Age of Onset   Hypertension Father      SOCIAL HISTORY:  reports that he quit smoking about 39 years ago. His smoking use included cigarettes. He started smoking about 61 years ago. He has a 20.00 pack-year smoking history. He has never used smokeless tobacco. He reports current alcohol use of about 4.0 standard drinks of alcohol per week. He reports that he does not use drugs. 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 He's accompanied by his wife, and they live in [place], [place].    ALLERGIES: Patient has no known  allergies.   MEDICATIONS:  Current Outpatient Medications  Medication Sig Dispense Refill   acetaminophen (TYLENOL) 500 MG tablet Take 1,000-1,500 mg by mouth every 8 (eight) hours as needed for moderate pain.     albuterol (VENTOLIN HFA) 108 (90 Base) MCG/ACT inhaler Inhale 2 puffs into the lungs every 6 (six) hours as needed for wheezing or shortness of breath. 24 g 1   apixaban (ELIQUIS) 5 MG TABS tablet Take 1 tablet (5 mg total) by mouth 2 (two) times daily. Okay to restart this medication with your evening dose on 01/04/2023 180 tablet 1   diltiazem (CARDIZEM CD) 120 MG 24 hr capsule Take 1 capsule (120 mg total) by mouth daily. Take at breakfast. 90 capsule 1   escitalopram (LEXAPRO) 10 MG tablet Take 10 mg by mouth daily.     insulin aspart protamine - aspart (NOVOLOG 70/30 MIX) (70-30) 100 UNIT/ML FlexPen Inject 27 Units into the skin 2 (two) times daily with a meal. 15 mL 11   Insulin Pen Needle (  PEN NEEDLES 3/16") 31G X 5 MM MISC 1 Pen by Does not apply route 2 (two) times daily before a meal. Before breakfast and dinner 100 each 1   loratadine (CLARITIN) 10 MG tablet Take 10 mg by mouth at bedtime.     melatonin 3 MG TABS tablet Take 6 mg by mouth at bedtime.     mesalamine (LIALDA) 1.2 g EC tablet Take 1.2 g by mouth daily.     metoprolol succinate (TOPROL-XL) 50 MG 24 hr tablet Take 1 tablet (50 mg total) by mouth every evening.     Multiple Vitamins-Minerals (PRESERVISION AREDS 2 PO) Take 1 capsule by mouth 2 (two) times daily.     rosuvastatin (CRESTOR) 10 MG tablet Take 0.5 tablets (5 mg total) by mouth daily. 45 tablet 1   senna-docusate (SENOKOT-S) 8.6-50 MG tablet Take 1 tablet by mouth at bedtime.     tamsulosin (FLOMAX) 0.4 MG CAPS capsule Take 1 capsule (0.4 mg total) by mouth 2 times daily at 12 noon and 4 pm. (Patient taking differently: Take 0.4 mg by mouth in the morning and at bedtime.) 60 capsule 0   Tiotropium Bromide-Olodaterol (STIOLTO RESPIMAT) 2.5-2.5 MCG/ACT AERS  Inhale 2 puffs into the lungs daily. 4 g 11   trimethoprim (TRIMPEX) 100 MG tablet Take 1 tablet (100 mg total) by mouth at bedtime.  11   No current facility-administered medications for this encounter.     REVIEW OF SYSTEMS: On review of systems, the patient reports that he is doing well overall. He continues to use 3 L O2 via  continuously. He has some coughing due to post nasal drip and he denies any hemoptysis. He reports a stable weight and denise any chest pain or difficulty with tightness. No other complaints are verbalized.      PHYSICAL EXAM:  Wt Readings from Last 3 Encounters:  01/20/23 239 lb 4 oz (108.5 kg)  01/19/23 234 lb 12.8 oz (106.5 kg)  01/11/23 240 lb 14.4 oz (109.3 kg)   Temp Readings from Last 3 Encounters:  01/20/23 (!) 97.2 F (36.2 C) (Temporal)  01/19/23 98.4 F (36.9 C) (Oral)  01/11/23 98 F (36.7 C) (Oral)   BP Readings from Last 3 Encounters:  01/20/23 109/66  01/19/23 126/74  01/11/23 124/65   Pulse Readings from Last 3 Encounters:  01/20/23 75  01/19/23 74  01/11/23 84   Pain Assessment Pain Score: 0-No pain/10  In general this is a chronically ill appearing caucasian male in no acute distress. He's alert and oriented x4 and appropriate throughout the examination. Cardiopulmonary assessment is negative for acute distress and he exhibits normal effort.     ECOG = 1  0 - Asymptomatic (Fully active, able to carry on all predisease activities without restriction)  1 - Symptomatic but completely ambulatory (Restricted in physically strenuous activity but ambulatory and able to carry out work of a light or sedentary nature. For example, light housework, office work)  2 - Symptomatic, <50% in bed during the day (Ambulatory and capable of all self care but unable to carry out any work activities. Up and about more than 50% of waking hours)  3 - Symptomatic, >50% in bed, but not bedbound (Capable of only limited self-care, confined to bed  or chair 50% or more of waking hours)  4 - Bedbound (Completely disabled. Cannot carry on any self-care. Totally confined to bed or chair)  5 - Death   Santiago Glad MM, Creech RH, Tormey DC, et al. [phone]). "  Toxicity and response criteria of the [facility]". Am. Evlyn Clines. Oncol. 5 (6): 649-55    LABORATORY DATA:  Lab Results  Component Value Date   WBC 9.4 01/03/2023   HGB 12.0 (L) 01/03/2023   HCT 36.5 (L) 01/03/2023   MCV 89.0 01/03/2023   PLT 231 01/03/2023   Lab Results  Component Value Date   NA 138 01/03/2023   K 4.3 01/03/2023   CL 103 01/03/2023   CO2 23 01/03/2023   Lab Results  Component Value Date   ALT 78 (H) 12/13/2022   AST 72 (H) 12/13/2022   ALKPHOS 129 (H) 12/13/2022   BILITOT 0.6 12/13/2022      RADIOGRAPHY: MR BRAIN W WO CONTRAST  Result Date: 01/18/2023 CLINICAL DATA:  Provided history: Malignant neoplasm of unspecified part of unspecified bronchus or lung. Non-small cell lung cancer, staging. Metastatic disease evaluation. EXAM: MRI HEAD WITHOUT AND WITH CONTRAST TECHNIQUE: Multiplanar, multiecho pulse sequences of the brain and surrounding structures were obtained without and with intravenous contrast. CONTRAST:  10mL GADAVIST GADOBUTROL 1 MMOL/ML IV SOLN COMPARISON:  Brain MRI 03/18/2018. FINDINGS: Brain: Moderate generalized cerebral atrophy. Multifocal T2 FLAIR hyperintense signal abnormality within the cerebral white matter, nonspecific but compatible with mild chronic small vessel ischemic disease. Small chronic infarct within the right caudate nucleus, new from the prior brain MRI of 03/18/2018. Small chronic infarct within the left cerebellar hemisphere, also new from the prior MRI. There is no acute infarct. No evidence of an intracranial mass. No chronic intracranial blood products. No extra-axial fluid collection. No midline shift. No pathologic intracranial enhancement identified. Vascular: Maintained flow voids within the proximal  large arterial vessels. Skull and upper cervical spine: No focal suspicious marrow lesion. Sinuses/Orbits: No mass or acute finding within the imaged orbits. Prior bilateral ocular lens replacement. Mild mucosal thickening within the right maxillary sinus. Mild mucosal thickening within the left maxillary sinus (with associated chronic reactive osteitis). Minimal mucosal thickening within bilateral ethmoid and frontal sinuses. IMPRESSION: 1. No evidence of intracranial metastatic disease. 2. Parenchymal atrophy, chronic small vessel ischemic disease and chronic infarcts as detailed. These findings have progressed from the prior brain MRI of 03/18/2018. 3. Paranasal sinus disease, as described. Electronically Signed   By: Jackey Loge D.O.   On: 01/18/2023 20:37   DG Chest Port 1 View  Result Date: 01/03/2023 CLINICAL DATA:  Status post bronchoscopy EXAM: PORTABLE CHEST 1 VIEW COMPARISON:  12/07/2022. FINDINGS: Diffuse pulmonary interstitial prominence consistent with pulmonary edema. Enlarged cardiac silhouette. Irregular masslike process with a metallic clip right upper lung appears larger than on the prior study measuring about 3 cm compared to about 2 cm previously. This interval change could represent reaction to biopsy in surrounding parenchyma. Aorta is tortuous and calcified. No pneumothorax or pleural effusion identified. IMPRESSION: Findings suggest worsening CHF. Right upper lung nodular spiculation increased in size, possibly on the basis of post biopsy changes. Electronically Signed   By: Layla Maw M.D.   On: 01/03/2023 09:25   DG C-ARM BRONCHOSCOPY  Result Date: 01/03/2023 C-ARM BRONCHOSCOPY: Fluoroscopy was utilized by the requesting physician.  No radiographic interpretation.       IMPRESSION/PLAN: 1. Stage IA2, cT1bN0M0, NSCLC, adenocarcinoma of the RUL with Synchronous Stage IA2, cT1bN0M0, NSCLC NOS of the LUL. Dr. Mitzi Hansen discusses the pathology findings and reviews the nature of  early stage lung cancer. While we cannot confirm the LUL cytology, there is a high degree of suspicion that it is malignant as the  right lung.  Dr. Mitzi Hansen reviews that the standard of care is for surgical resection. However for patients who are not medical candidates to undergo surgery, or who choose to forgo surgery, stereotactic body radiotherapy (SBRT) is an appropriate alternative. We discussed the risks, benefits, short, and long term effects of radiotherapy, as well as the curative intent, and the patient is interested in proceeding. Dr. Mitzi Hansen discusses the delivery and logistics of radiotherapy and anticipates a course of 3-5 fractions of SBR to both the RUL and LUL nodules. Written consent is obtained and placed in the chart, a copy was provided to the patient. The patient will be contacted to coordinate treatment planning by our simulation department.     In a visit lasting 60 minutes, greater than 50% of the time was spent face to face discussing the patient's condition, in preparation for the discussion, and coordinating the patient's care.   The above documentation reflects my direct findings during this shared patient visit. Please see the separate note by Dr. Mitzi Hansen on this date for the remainder of the patient's plan of care.    Osker Mason, [facility]   **Disclaimer: This note was dictated with voice recognition software. Similar sounding words can inadvertently be transcribed and this note may contain transcription errors which may not have been corrected upon publication of note.**

## 2023-01-18 ENCOUNTER — Encounter: Payer: Self-pay | Admitting: Orthopedic Surgery

## 2023-01-18 ENCOUNTER — Ambulatory Visit (HOSPITAL_COMMUNITY)
Admission: RE | Admit: 2023-01-18 | Discharge: 2023-01-18 | Disposition: A | Discharge status: Home / Self Care | Payer: Medicare Other | Source: Ambulatory Visit | Attending: Physician Assistant | Admitting: Physician Assistant

## 2023-01-18 DIAGNOSIS — C3490 Malignant neoplasm of unspecified part of unspecified bronchus or lung: Secondary | ICD-10-CM | POA: Diagnosis not present

## 2023-01-18 DIAGNOSIS — G319 Degenerative disease of nervous system, unspecified: Secondary | ICD-10-CM | POA: Diagnosis not present

## 2023-01-18 MED ORDER — GADOBUTROL 1 MMOL/ML IV SOLN
10.0000 mL | Freq: Once | INTRAVENOUS | Status: AC | PRN
  Administered 2023-01-18: 10 mL via INTRAVENOUS

## 2023-01-18 NOTE — Progress Notes (Signed)
Office Visit Note   Patient: Kevin [name]           Date of Birth: [birth year]           MRN: [id number] Visit Date: 01/13/2023              Requested by: Blair Heys, MD 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  Assessment & Plan: Visit Diagnoses:  1. Non-pressure chronic ulcer of other part of left foot limited to breakdown of skin     Plan: Ulcer was debrided good healthy tissue no ischemic changes no signs of infection.  Follow-Up Instructions: No follow-ups on file.   Ortho Exam  Patient is alert, oriented, no adenopathy, well-dressed, normal affect, normal respiratory effort. Examination patient has a well-healed transmetatarsal amputation.  There is no redness or cellulitis.  There is a Wagner grade 1 plantar ulcer.  After informed consent a 10 blade knife was used to debride the skin and soft tissue back to healthy viable granulation tissue no exposed bone or tendon no ischemic changes.  Ulcer is 1 cm diameter 1 mm deep after debridement.  Imaging: No results found. No images are attached to the encounter.  Labs: Lab Results  Component Value Date   HGBA1C 10.2 (H) 12/08/2022   HGBA1C 8.7 (H) 08/20/2019   HGBA1C 10.6 (H) 12/17/2018   ESRSEDRATE 28 (H) 12/12/2022   CRP 3.7 (H) 12/12/2022   CRP 0.8 08/24/2019   CRP <0.8 08/23/2019   REPTSTATUS 12/12/2022 FINAL 12/10/2022   GRAMSTAIN  03/17/2018    MODERATE WBC PRESENT, PREDOMINANTLY PMN MODERATE GRAM POSITIVE COCCI FEW GRAM NEGATIVE RODS    CULT  12/10/2022    NO GROWTH Performed at [facility], [address], [place], [place] [address]    Imelda Pillow ENTEROCOCCUS FAECALIS (A) 08/20/2019     Lab Results  Component Value Date   ALBUMIN 2.7 (L) 12/13/2022   ALBUMIN 2.7  (L) 12/12/2022   ALBUMIN 2.7 (L) 12/11/2022    Lab Results  Component Value Date   MG 2.1 12/13/2022   MG 2.4 12/12/2022   MG 2.2 12/09/2022   No results found for: "VD25OH"  No results found for: "PREALBUMIN"    Latest Ref Rng & Units 01/03/2023    6:48 AM 12/12/2022    2:23 AM 12/11/2022    1:04 AM  CBC EXTENDED  WBC 4.0 - 10.5 K/uL 9.4  10.4  10.6   RBC 4.22 - 5.81 MIL/uL 4.10  5.25  5.16   Hemoglobin 13.0 - 17.0 g/dL 64.4  03.4  74.2   HCT 39.0 - 52.0 % 36.5  45.0  44.2   Platelets 150 - 400 K/uL 231  333  365   NEUT# 1.7 - 7.7 K/uL   6.5   Lymph# 0.7 - 4.0 K/uL   2.6      There is no height or weight on file to calculate BMI.  Orders:  No orders of the defined types were placed in this encounter.  No orders of the defined types were placed in this encounter.    Procedures: No procedures performed  Clinical Data: No additional findings.  ROS:  All other systems negative, except as noted in the HPI. Review of Systems  Objective: Vital Signs: There were no vitals taken for this visit.  Specialty Comments:  No specialty comments available.  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   Hypercoagulable state due to persistent atrial fibrillation 12/27/2022   Bilateral hydronephrosis 12/11/2022   CAD (coronary artery disease) 12/09/2022   Mass of upper lobe of right lung 12/08/2022   Acute on chronic respiratory failure with hypoxia 12/08/2022   History of recurrent UTI (urinary tract infection) 12/08/2022   DNR (do not resuscitate) 12/08/2022   Hypoxia 12/08/2022   Shortness of breath 12/08/2022   History of amputation of left foot 06/09/2022   Cough 05/18/2022   Pulmonary nodules/lesions, multiple 01/08/2021   COPD (chronic obstructive pulmonary disease) 06/26/2020   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   Chronic kidney disease, stage 3b  08/20/2019   Chronic respiratory failure 08/20/2019   Pneumonia due to COVID-19 virus 08/19/2019   UTI (urinary tract infection) 12/15/2018   Acute on chronic diastolic CHF (congestive heart failure) 12/15/2018   HLD (hyperlipidemia) 12/15/2018   Dehiscence of amputation stump    Renal insufficiency 05/06/2018   Insulin dependent type 2 diabetes mellitus 05/06/2018   Debility 03/31/2018   Urinary retention with incomplete bladder emptying 03/31/2018   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    Diabetic foot ulcer    Status post transmetatarsal amputation of left foot    Postoperative pain    Benign prostatic hyperplasia with urinary retention    New onset atrial fibrillation    Incontinence of feces    Cerebrovascular accident (CVA)    Benign essential HTN    Diabetes mellitus type 2 in nonobese    Hypokalemia    PAF (paroxysmal atrial fibrillation)    Leukocytosis    SIRS (systemic inflammatory response syndrome)    AKI (acute kidney injury)    Essential hypertension 03/20/2018   Tachycardia 03/20/2018   Sepsis 03/20/2018   Diabetes 03/20/2018   Osteomyelitis 03/20/2018   A-fib 03/20/2018   Stroke-like episode (HCC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 03/16/2018   Strain of left tibialis anterior muscle 12/19/2015   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   Fracture of radius, distal, left, closed 12/19/2012   H/O ulcerative colitis 1993   Past Medical History:  Diagnosis Date   A-fib 03/20/2018   Acute blood loss anemia    Anxiety    Benign prostatic hyperplasia with urinary retention    Cerebrovascular accident (CVA)    CHF (congestive heart failure)    COPD (chronic obstructive pulmonary disease)    Diabetes mellitus type 2 in nonobese    Dyspnea    Dysrhythmia    A-Fib   Essential hypertension 03/20/2018   Former tobacco use    H/O ulcerative colitis 1993   Leukocytosis    Osteomyelitis    a. 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   PAF (paroxysmal atrial  fibrillation)    a. dx 03/2018 in setting of stroke, osteomyelitis.   Stage 3 chronic kidney disease    pt/family unaware   Status post transmetatarsal amputation of left foot    Umbilical hernia     Family History  Problem Relation Age of Onset   Hypertension Father     Past Surgical History:  Procedure Laterality Date   AMPUTATION Left 03/24/2018   Procedure: Transmetatarsal Amputation Left Foot;  Surgeon: Nadara Mustard, MD;  Location: [facility] OR;  Service: Orthopedics;  Laterality: Left;  BRONCHIAL BIOPSY  01/03/2023   Procedure: BRONCHIAL BIOPSIES;  Surgeon: Leslye Peer, MD;  Location: [facility] ENDOSCOPY;  Service: Pulmonary;;   BRONCHIAL BRUSHINGS  01/03/2023   Procedure: BRONCHIAL BRUSHINGS;  Surgeon: Leslye Peer, MD;  Location: [facility] ENDOSCOPY;  Service: Pulmonary;;   BRONCHIAL NEEDLE ASPIRATION BIOPSY  01/03/2023   Procedure: BRONCHIAL NEEDLE ASPIRATION BIOPSIES;  Surgeon: Leslye Peer, MD;  Location: [facility] ENDOSCOPY;  Service: Pulmonary;;   CARDIOVERSION N/A 05/05/2018   Procedure: CARDIOVERSION;  Surgeon: Chilton Si, MD;  Location: [facility] ENDOSCOPY;  Service: Cardiovascular;  Laterality: N/A;   CARDIOVERSION N/A 12/02/2022   Procedure: CARDIOVERSION;  Surgeon: Jodelle Red, MD;  Location: [facility] ENDOSCOPY;  Service: Cardiovascular;  Laterality: N/A;   COLONOSCOPY  12/2017   benign, remission from ulcerative colitis in 90s   FIDUCIAL MARKER PLACEMENT  01/03/2023   Procedure: FIDUCIAL MARKER PLACEMENT;  Surgeon: Leslye Peer, MD;  Location: [facility] ENDOSCOPY;  Service: Pulmonary;;   I & D EXTREMITY Left 03/17/2018   Procedure: IRRIGATION AND DEBRIDEMENT FOOT;  Surgeon: Yolonda Kida, MD;  Location: [facility] OR;  Service: Orthopedics;  Laterality: Left;   STUMP REVISION Left 05/10/2018   Procedure: REVISION LEFT TRANSMETATARSAL AMPUTATION;  Surgeon: Nadara Mustard, MD;  Location: [facility] OR;  Service: Orthopedics;  Laterality: Left;   VIDEO BRONCHOSCOPY WITH RADIAL ENDOBRONCHIAL ULTRASOUND   01/03/2023   Procedure: VIDEO BRONCHOSCOPY WITH RADIAL ENDOBRONCHIAL ULTRASOUND;  Surgeon: Leslye Peer, MD;  Location: [facility] ENDOSCOPY;  Service: Pulmonary;;   Social History   Occupational History   Not on file  Tobacco Use   Smoking status: Former    Packs/day: 1.00    Years: 20.00    Additional pack years: 0.00    Total pack years: 20.00    Types: Cigarettes    Start date: 1963    Quit date: 1985    Years since quitting: 39.3   Smokeless tobacco: Never  Vaping Use   Vaping Use: Never used  Substance and Sexual Activity   Alcohol use: Yes    Alcohol/week: 4.0 standard drinks of alcohol    Types: 4 Cans of beer per week    Comment: social    Drug use: Never   Sexual activity: Not on file

## 2023-01-19 ENCOUNTER — Encounter: Payer: Self-pay | Admitting: Physician Assistant

## 2023-01-19 ENCOUNTER — Encounter: Payer: Self-pay | Admitting: Emergency Medicine

## 2023-01-19 ENCOUNTER — Ambulatory Visit (INDEPENDENT_AMBULATORY_CARE_PROVIDER_SITE_OTHER): Payer: Medicare Other | Admitting: Emergency Medicine

## 2023-01-19 VITALS — BP 126/74 | HR 74 | Temp 98.4°F | Ht 74.0 in | Wt 234.8 lb

## 2023-01-19 DIAGNOSIS — I480 Paroxysmal atrial fibrillation: Secondary | ICD-10-CM

## 2023-01-19 DIAGNOSIS — J449 Chronic obstructive pulmonary disease, unspecified: Secondary | ICD-10-CM

## 2023-01-19 DIAGNOSIS — C3411 Malignant neoplasm of upper lobe, right bronchus or lung: Secondary | ICD-10-CM

## 2023-01-19 DIAGNOSIS — J9611 Chronic respiratory failure with hypoxia: Secondary | ICD-10-CM

## 2023-01-19 NOTE — Assessment & Plan Note (Signed)
Continue oxygen at 2 L/min at all times.  We will consider repeating a walking oximetry in the future to see if you can qualify for portable oxygen concentrator.  Will probably do this after your atrial fibrillation treatment and your cancer treatment.

## 2023-01-19 NOTE — Progress Notes (Signed)
Subjective:    Patient ID: Kevin [name], male    DOB: 1941/04/21, 82 y.o.   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  Also with a history of hypertension with diastolic CHF, CKD stage IIIb, atrial fibrillation with DCCV on 3/7 (Eliquis), diabetes with a left foot ulcer.  His most recent outpatient CT chest 04/28/2022 showed a persistent 1.8 x 1.2 cm spiculated right upper lobe nodule, 2.2 x 0.8 cm elongated left upper lobe nodule.  Currently managed on Stiolto, uses oxygen 3 L/min with heavy exertion.  He had a repeat CT chest 11/03/2022 as below, then underwent another CT chest 12/08/2022 while he was admitted for acute on chronic hypoxemic respiratory failure, atrial fibrillation with RVR.  CT chest 11/03/2022 reviewed by me shows a continued increase in size of his spiculated right upper lobe pulmonary nodule now measuring 2.0 x 1.3 x 2.1 cm.  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  He underwent bronchoscopy 01/03/2023 to evaluate slowly enlarging upper lobe nodules.  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 He is on loratadine, Stiolto, uses albuterol about 1x a day. Daily cough, productive of clear.  Today he reports that he doing well after the bronchoscopy. Reliable with O2 at 3L/min. Better stamina and recovery time  MRI Brain 01/18/23 -- no evidence intracranial metastatic disease.    Review of Systems As per HPI  Past Medical History:  Diagnosis Date   A-fib 03/20/2018   Acute blood loss anemia    Anxiety    Benign prostatic  hyperplasia with urinary retention    Cerebrovascular accident (CVA)    CHF (congestive heart failure)    COPD (chronic obstructive pulmonary disease)    Diabetes mellitus type 2 in nonobese    Dyspnea    Dysrhythmia    A-Fib   Essential hypertension 03/20/2018   Former tobacco use    H/O ulcerative colitis 1993   Leukocytosis    Osteomyelitis    a. 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   PAF (paroxysmal atrial fibrillation)    a. dx 03/2018 in setting of stroke, osteomyelitis.   Stage 3 chronic kidney disease    pt/family unaware   Status post transmetatarsal amputation of left foot    Umbilical hernia      Family History  Problem Relation Age of Onset   Hypertension Father     No hx lung cancer.   Social History   Socioeconomic History   Marital status: Married    Spouse name: Not on file   Number of children: Not on file   Years of education: Not on file   Highest education level: Bachelor's degree (e.g., BA, AB, BS)  Occupational History   Not on file  Tobacco Use   Smoking status: Former    Packs/day: 1.00    Years: 20.00    Additional pack years: 0.00    Total pack years: 20.00    Types: Cigarettes    Start date: 39    Quit date: 1985    Years since quitting: 39.3   Smokeless tobacco: Never  Vaping Use  Vaping Use: Never used  Substance and Sexual Activity   Alcohol use: Yes    Alcohol/week: 4.0 standard drinks of alcohol    Types: 4 Cans of beer per week    Comment: social    Drug use: Never   Sexual activity: Not on file  Other Topics Concern   Not on file  Social History Narrative   Lives at home with his wife and two cats. Married for almost 45 years   Right handed   Caffeine: 1 cup every morning   Social Determinants of Health   Financial Resource Strain: Not on file  Food Insecurity: No Food Insecurity (12/08/2022)   Hunger Vital Sign    Worried About Running Out of Food in the Last Year: Never true    Ran Out of Food in the Last  Year: Never true  Transportation Needs: No Transportation Needs (12/08/2022)   PRAPARE - Administrator, Civil Service (Medical): No    Lack of Transportation (Non-Medical): No  Physical Activity: Not on file  Stress: Not on file  Social Connections: Not on file  Intimate Partner Violence: Not At Risk (12/08/2022)   Humiliation, Afraid, Rape, and Kick questionnaire    Fear of Current or Ex-Partner: No    Emotionally Abused: No    Physically Abused: No    Sexually Abused: No      No Known Allergies   Outpatient Medications Prior to Visit  Medication Sig Dispense Refill   acetaminophen (TYLENOL) 500 MG tablet Take 1,000-1,500 mg by mouth every 8 (eight) hours as needed for moderate pain.     albuterol (VENTOLIN HFA) 108 (90 Base) MCG/ACT inhaler Inhale 2 puffs into the lungs every 6 (six) hours as needed for wheezing or shortness of breath. 24 g 1   apixaban (ELIQUIS) 5 MG TABS tablet Take 1 tablet (5 mg total) by mouth 2 (two) times daily. Okay to restart this medication with your evening dose on 01/04/2023 180 tablet 1   diltiazem (CARDIZEM CD) 120 MG 24 hr capsule Take 1 capsule (120 mg total) by mouth daily. Take at breakfast. 90 capsule 1   escitalopram (LEXAPRO) 10 MG tablet Take 10 mg by mouth daily.     insulin aspart protamine - aspart (NOVOLOG 70/30 MIX) (70-30) 100 UNIT/ML FlexPen Inject 27 Units into the skin 2 (two) times daily with a meal. 15 mL 11   Insulin Pen Needle (PEN NEEDLES 3/16") 31G X 5 MM MISC 1 Pen by Does not apply route 2 (two) times daily before a meal. Before breakfast and dinner 100 each 1   loratadine (CLARITIN) 10 MG tablet Take 10 mg by mouth at bedtime.     melatonin 3 MG TABS tablet Take 6 mg by mouth at bedtime.     mesalamine (LIALDA) 1.2 g EC tablet Take 1.2 g by mouth daily.     metoprolol succinate (TOPROL-XL) 50 MG 24 hr tablet Take 1 tablet (50 mg total) by mouth every evening.     Multiple Vitamins-Minerals (PRESERVISION AREDS 2 PO)  Take 1 capsule by mouth 2 (two) times daily.     rosuvastatin (CRESTOR) 10 MG tablet Take 0.5 tablets (5 mg total) by mouth daily. 45 tablet 1   senna-docusate (SENOKOT-S) 8.6-50 MG tablet Take 1 tablet by mouth at bedtime.     tamsulosin (FLOMAX) 0.4 MG CAPS capsule Take 1 capsule (0.4 mg total) by mouth 2 times daily at 12 noon and 4 pm. (Patient taking differently: Take  0.4 mg by mouth in the morning and at bedtime.) 60 capsule 0   Tiotropium Bromide-Olodaterol (STIOLTO RESPIMAT) 2.5-2.5 MCG/ACT AERS Inhale 2 puffs into the lungs daily. 4 g 11   trimethoprim (TRIMPEX) 100 MG tablet Take 1 tablet (100 mg total) by mouth at bedtime.  11   No facility-administered medications prior to visit.         Objective:   Physical Exam  Today's Vitals   01/19/23 1322  BP: 126/74  Pulse: 74  Temp: 98.4 F (36.9 C)  TempSrc: Oral  Weight: 234 lb 12.8 oz (106.5 kg)  Height: 6\' 2"  (1.88 m)   Body mass index is 30.15 kg/m.;sm  Gen: Pleasant, well-nourished, in no distress,  normal affect  ENT: No lesions,  mouth clear,  oropharynx clear, no postnasal drip  Neck: No JVD, no stridor  Lungs: No use of accessory muscles, no crackles or wheezing on normal respiration, no wheeze on forced expiration  Cardiovascular: RRR, heart sounds normal, no murmur or gallops, no peripheral edema  Musculoskeletal: No deformities, no cyanosis or clubbing  Neuro: alert, awake, non focal  Skin: Warm, no lesions or rash    Assessment & Plan:  COPD (chronic obstructive pulmonary disease) (HCC) Please continue your Stiolto 2 puffs once daily. Keep your albuterol available use 2 puffs when needed for shortness of breath, chest tightness, wheezing. Follow Dr. Delton Coombes in 6 months, please call sooner if you have any problems or changes in your breathing.  Chronic respiratory failure (HCC) Continue oxygen at 2 L/min at all times.  We will consider repeating a walking oximetry in the future to see if you can  qualify for portable oxygen concentrator.  Will probably do this after your atrial fibrillation treatment and your cancer treatment.  PAF (paroxysmal atrial fibrillation) (HCC) Suspect that this is a contributor to his dyspnea.  He has plans to follow-up with cardiology, hopefully get back into sinus rhythm.  Adenocarcinoma of upper lobe of right lung Adenocarcinoma of the right upper lobe, left upper lobe.  PET scan is pending.  MRI brain reassuring.  If consistent with 2 synchronous primary suspect that he will end up having SBRT to both.  He has plans to follow-up with medical oncology and radiation oncology.   Levy Pupa, MD, PhD 01/19/2023, 1:52 PM [facility] [phone] or if no answer before 7:00PM call [phone] For any issues after 7:00PM please call eLink [phone]

## 2023-01-19 NOTE — Assessment & Plan Note (Signed)
Suspect that this is a contributor to his dyspnea.  He has plans to follow-up with cardiology, hopefully get back into sinus rhythm.

## 2023-01-19 NOTE — Assessment & Plan Note (Signed)
Adenocarcinoma of the right upper lobe, left upper lobe.  PET scan is pending.  MRI brain reassuring.  If consistent with 2 synchronous primary suspect that he will end up having SBRT to both.  He has plans to follow-up with medical oncology and radiation oncology.

## 2023-01-19 NOTE — Progress Notes (Signed)
Thoracic Location of Tumor / Histology: RUL lung  Patient presented for follow-up scans for a lung nodule that has been in surveillance.  Recently patient was admitted with atrial fibrillation and was noted to have a persistent RUL pulmonary nodule.  PET 01/27/2023:   MRI Brain 01/18/2023: No evidence of intracranial metastatic disease.   Bronchoscopy 01/03/2023:  CT Chest 12/08/2022: Irregular right upper lobe pulmonary nodule measures 2.0 x 1.4 cm similar on the prior exam when measured in a similar fashion. This is contiguous with an area of more linear, bandlike opacity which is also unchanged.  Relatively linear posterior left upper lobe density of maximally 1.8 cm is similar to on the prior exam (when remeasured).  Presumed subpleural lymph node about the posterior left major fissure at 7 mm is unchanged.    Biopsies of Right Upper Lobe Lung 01/03/2023    Tobacco/Marijuana/Snuff/ETOH use: Former smoker, quit 40 years ago.  Past/Anticipated interventions by cardiothoracic surgery, if any:   Past/Anticipated interventions by medical oncology, if any:  Cassie Heilingoetter / Dr. Arbutus Ped 01/11/2023 -Dr. Arbutus Ped recommends treating both of the lung lesions with radiation.   -Dr. Arbutus Ped discussed that we cannot rule out that the other smaller lung nodules are also cancerous.  We will need to monitor these closely on future imaging studies.  -The patient is likely not a surgical candidate given that he is oxygen dependent and comorbidities.    Signs/Symptoms Weight changes, if any: Stable. Respiratory complaints, if any: He reports SOB, wearing 3 liters. Hemoptysis, if any: He reports some coughing due to post nasal drip.  Denies hemoptysis. Pain issues, if any:  Denies chest pain, pressure, or tightness.  SAFETY ISSUES: Prior radiation? No Pacemaker/ICD? No  Possible current pregnancy? N/a Is the patient on methotrexate? No  Current Complaints / other details:   -3 Liters Oxygen

## 2023-01-19 NOTE — Assessment & Plan Note (Signed)
Please continue your Stiolto 2 puffs once daily. Keep your albuterol available use 2 puffs when needed for shortness of breath, chest tightness, wheezing. Follow Dr. Delton Coombes in 6 months, please call sooner if you have any problems or changes in your breathing.

## 2023-01-19 NOTE — Patient Instructions (Signed)
Please continue your Stiolto 2 puffs once daily. Keep your albuterol available use 2 puffs when needed for shortness of breath, chest tightness, wheezing. Continue oxygen at 2 L/min at all times.  We will consider repeating a walking oximetry in the future to see if you can qualify for portable oxygen concentrator.  Will probably do this after your atrial fibrillation treatment and your cancer treatment. Follow with cardiology, radiation oncology and medical oncology as planned. Follow Dr. Delton Coombes in 6 months, please call sooner if you have any problems or changes in your breathing.

## 2023-01-20 ENCOUNTER — Ambulatory Visit
Admission: RE | Admit: 2023-01-20 | Discharge: 2023-01-20 | Disposition: A | Discharge status: Home / Self Care | Payer: Medicare Other | Source: Ambulatory Visit | Attending: Radiation Oncology | Admitting: Radiation Oncology

## 2023-01-20 ENCOUNTER — Encounter: Payer: Self-pay | Admitting: Radiation Oncology

## 2023-01-20 VITALS — BP 109/66 | HR 75 | Temp 97.2°F | Resp 18 | Ht 74.0 in | Wt 239.2 lb

## 2023-01-20 DIAGNOSIS — I4891 Unspecified atrial fibrillation: Secondary | ICD-10-CM | POA: Insufficient documentation

## 2023-01-20 DIAGNOSIS — J449 Chronic obstructive pulmonary disease, unspecified: Secondary | ICD-10-CM | POA: Diagnosis not present

## 2023-01-20 DIAGNOSIS — I10 Essential (primary) hypertension: Secondary | ICD-10-CM | POA: Insufficient documentation

## 2023-01-20 DIAGNOSIS — C3412 Malignant neoplasm of upper lobe, left bronchus or lung: Secondary | ICD-10-CM | POA: Diagnosis not present

## 2023-01-20 DIAGNOSIS — Z7901 Long term (current) use of anticoagulants: Secondary | ICD-10-CM | POA: Diagnosis not present

## 2023-01-20 DIAGNOSIS — I480 Paroxysmal atrial fibrillation: Secondary | ICD-10-CM | POA: Insufficient documentation

## 2023-01-20 DIAGNOSIS — Z79899 Other long term (current) drug therapy: Secondary | ICD-10-CM | POA: Diagnosis not present

## 2023-01-20 DIAGNOSIS — N183 Chronic kidney disease, stage 3 unspecified: Secondary | ICD-10-CM | POA: Diagnosis not present

## 2023-01-20 DIAGNOSIS — Z8673 Personal history of transient ischemic attack (TIA), and cerebral infarction without residual deficits: Secondary | ICD-10-CM | POA: Insufficient documentation

## 2023-01-20 DIAGNOSIS — C3480 Malignant neoplasm of overlapping sites of unspecified bronchus and lung: Secondary | ICD-10-CM

## 2023-01-20 DIAGNOSIS — R0602 Shortness of breath: Secondary | ICD-10-CM | POA: Diagnosis not present

## 2023-01-20 DIAGNOSIS — E119 Type 2 diabetes mellitus without complications: Secondary | ICD-10-CM | POA: Insufficient documentation

## 2023-01-20 DIAGNOSIS — C3411 Malignant neoplasm of upper lobe, right bronchus or lung: Secondary | ICD-10-CM | POA: Insufficient documentation

## 2023-01-20 DIAGNOSIS — I509 Heart failure, unspecified: Secondary | ICD-10-CM | POA: Insufficient documentation

## 2023-01-20 DIAGNOSIS — N400 Enlarged prostate without lower urinary tract symptoms: Secondary | ICD-10-CM | POA: Insufficient documentation

## 2023-01-20 DIAGNOSIS — D500 Iron deficiency anemia secondary to blood loss (chronic): Secondary | ICD-10-CM | POA: Insufficient documentation

## 2023-01-21 DIAGNOSIS — Z87891 Personal history of nicotine dependence: Secondary | ICD-10-CM | POA: Diagnosis not present

## 2023-01-21 DIAGNOSIS — C3412 Malignant neoplasm of upper lobe, left bronchus or lung: Secondary | ICD-10-CM | POA: Diagnosis not present

## 2023-01-21 DIAGNOSIS — C3411 Malignant neoplasm of upper lobe, right bronchus or lung: Secondary | ICD-10-CM | POA: Diagnosis not present

## 2023-01-21 NOTE — Progress Notes (Signed)
FAA and ABN needed for [facility] Medicine testing faxed to [phone]

## 2023-01-25 ENCOUNTER — Encounter (HOSPITAL_COMMUNITY): Payer: Self-pay | Admitting: Internal Medicine

## 2023-01-26 ENCOUNTER — Ambulatory Visit (HOSPITAL_COMMUNITY)
Admission: RE | Admit: 2023-01-26 | Discharge: 2023-01-26 | Disposition: A | Discharge status: Home / Self Care | Payer: Medicare Other | Source: Ambulatory Visit | Attending: Internal Medicine | Admitting: Internal Medicine

## 2023-01-26 ENCOUNTER — Telehealth: Payer: Self-pay | Admitting: Pharmacist

## 2023-01-26 ENCOUNTER — Encounter (HOSPITAL_COMMUNITY): Payer: Self-pay | Admitting: Internal Medicine

## 2023-01-26 VITALS — BP 126/70 | HR 76 | Ht 74.0 in | Wt 239.2 lb

## 2023-01-26 DIAGNOSIS — N401 Enlarged prostate with lower urinary tract symptoms: Secondary | ICD-10-CM | POA: Diagnosis not present

## 2023-01-26 DIAGNOSIS — R338 Other retention of urine: Secondary | ICD-10-CM | POA: Insufficient documentation

## 2023-01-26 DIAGNOSIS — I4891 Unspecified atrial fibrillation: Secondary | ICD-10-CM | POA: Diagnosis not present

## 2023-01-26 DIAGNOSIS — Z8673 Personal history of transient ischemic attack (TIA), and cerebral infarction without residual deficits: Secondary | ICD-10-CM | POA: Diagnosis not present

## 2023-01-26 DIAGNOSIS — C3490 Malignant neoplasm of unspecified part of unspecified bronchus or lung: Secondary | ICD-10-CM | POA: Diagnosis not present

## 2023-01-26 DIAGNOSIS — Z7901 Long term (current) use of anticoagulants: Secondary | ICD-10-CM | POA: Insufficient documentation

## 2023-01-26 DIAGNOSIS — I5032 Chronic diastolic (congestive) heart failure: Secondary | ICD-10-CM | POA: Insufficient documentation

## 2023-01-26 DIAGNOSIS — Z9981 Dependence on supplemental oxygen: Secondary | ICD-10-CM | POA: Insufficient documentation

## 2023-01-26 DIAGNOSIS — Z7951 Long term (current) use of inhaled steroids: Secondary | ICD-10-CM | POA: Diagnosis not present

## 2023-01-26 DIAGNOSIS — Z87891 Personal history of nicotine dependence: Secondary | ICD-10-CM | POA: Diagnosis not present

## 2023-01-26 DIAGNOSIS — D6869 Other thrombophilia: Secondary | ICD-10-CM | POA: Diagnosis not present

## 2023-01-26 DIAGNOSIS — Z89422 Acquired absence of other left toe(s): Secondary | ICD-10-CM | POA: Insufficient documentation

## 2023-01-26 DIAGNOSIS — L308 Other specified dermatitis: Secondary | ICD-10-CM | POA: Diagnosis not present

## 2023-01-26 DIAGNOSIS — Z8616 Personal history of COVID-19: Secondary | ICD-10-CM | POA: Diagnosis not present

## 2023-01-26 DIAGNOSIS — Z950 Presence of cardiac pacemaker: Secondary | ICD-10-CM | POA: Diagnosis not present

## 2023-01-26 DIAGNOSIS — J449 Chronic obstructive pulmonary disease, unspecified: Secondary | ICD-10-CM | POA: Insufficient documentation

## 2023-01-26 DIAGNOSIS — Z79899 Other long term (current) drug therapy: Secondary | ICD-10-CM | POA: Insufficient documentation

## 2023-01-26 DIAGNOSIS — N1832 Chronic kidney disease, stage 3b: Secondary | ICD-10-CM | POA: Diagnosis not present

## 2023-01-26 DIAGNOSIS — J9611 Chronic respiratory failure with hypoxia: Secondary | ICD-10-CM | POA: Insufficient documentation

## 2023-01-26 DIAGNOSIS — I4819 Other persistent atrial fibrillation: Secondary | ICD-10-CM

## 2023-01-26 DIAGNOSIS — E1165 Type 2 diabetes mellitus with hyperglycemia: Secondary | ICD-10-CM | POA: Diagnosis not present

## 2023-01-26 DIAGNOSIS — E1122 Type 2 diabetes mellitus with diabetic chronic kidney disease: Secondary | ICD-10-CM | POA: Insufficient documentation

## 2023-01-26 DIAGNOSIS — I480 Paroxysmal atrial fibrillation: Secondary | ICD-10-CM | POA: Diagnosis not present

## 2023-01-26 DIAGNOSIS — I130 Hypertensive heart and chronic kidney disease with heart failure and stage 1 through stage 4 chronic kidney disease, or unspecified chronic kidney disease: Secondary | ICD-10-CM | POA: Insufficient documentation

## 2023-01-26 DIAGNOSIS — Z794 Long term (current) use of insulin: Secondary | ICD-10-CM | POA: Diagnosis not present

## 2023-01-26 DIAGNOSIS — I4510 Unspecified right bundle-branch block: Secondary | ICD-10-CM | POA: Diagnosis not present

## 2023-01-26 DIAGNOSIS — F419 Anxiety disorder, unspecified: Secondary | ICD-10-CM | POA: Diagnosis not present

## 2023-01-26 NOTE — Telephone Encounter (Signed)
Per pcp to wean off lexapro over next 4 days then  will start effexor.   Pt will contact urologist regarding Trimethoprim - will call back with update.

## 2023-01-26 NOTE — Telephone Encounter (Signed)
Medication list reviewed in anticipation of upcoming Tikosyn initiation. Patient uses albuterol and Stiolto which can prolong QTc but are ok to continue. He does take escitalopram which is more QTc prolonging than other SSRIs and should ideally be changed to less QTc prolonging agent if possible like an SNRI - should work with his PCP to see if this is possible. He also has daily trimethoprim on his med list which is contraindicated with Tikosyn and would need to be stopped.  Patient is anticoagulated on Eliquis 5mg  BID on the appropriate dose. Please ensure that patient has not missed any anticoagulation doses in the 3 weeks prior to Tikosyn initiation.   Patient will need to be counseled to avoid use of Benadryl while on Tikosyn and in the 2-3 days prior to Tikosyn initiation.

## 2023-01-26 NOTE — Patient Instructions (Addendum)
Talk to primary care regarding weaning off lexapro and replacement (cymbalta or effexor are both agents that work well with Joice Lofts) -- call Tyquavious Gamel with update of this [phone]

## 2023-01-26 NOTE — Progress Notes (Signed)
Primary Care Physician: Blair Heys, MD Primary Cardiologist: Dr. Clifton James Primary Electrophysiologist: None Referring Physician:    [name] is a 82 y.o. 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 History of cardioversion in 2019. Seen by Cardiology on 11/25/22 for shortness of breath and was noted to be in Afib without RVR. He underwent DCCV on 12/02/22 with successful conversion to normal rhythm after 2 shocks. He was recently admitted 3/12-18/24 for acute on chronic respiratory failure with hypoxia in the setting of COPD and CHF exacerbation and atrial fibrillation. Patient is on Eliquis 5 mg BID for a CHADS2VASC score of 8.  On evaluation 12/27/22, he is in Afib today. Wife and patient agree that he seems to be more short of breath when he is in Afib. He uses oxygen at night. Review of records demonstrate he underwent DCCV on 3/7 and most likely went back into Afib on 3/12. He has been compliant with Eliquis and has not missed any doses. He has a scheduled bronchoscopy for RUL nodule on 4/8. He will stop taking Eliquis on 4/5 prior to procedure.   He drinks several beers on the weekend.   On follow up today 5/1, he is in Afib. 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 He is scheduled for stereotactic body radiotherapy (SBRT) for this finding on 5/20. He is more short of breath than usual when in Afib. He has not missed any doses of anticoagulation.   Today, he denies symptoms of palpitations, chest pain, orthopnea, PND, lower extremity edema, dizziness, presyncope, syncope, snoring, daytime  somnolence, bleeding, or neurologic sequela. The patient is tolerating medications without difficulties and is otherwise without complaint today.    Atrial Fibrillation Risk Factors:  he does not have symptoms or diagnosis of sleep apnea. he does not have a history of rheumatic fever. he does not have a history of alcohol use. The patient does not have a history of early familial atrial fibrillation or other arrhythmias.  he has a BMI of Body mass index is 30.71 kg/m.Marland Kitchen Filed Weights   01/26/23 1329  Weight: 108.5 kg     Family History  Problem Relation Age of Onset   Hypertension Father      Atrial Fibrillation Management history:  Previous antiarrhythmic drugs: None Previous cardioversions: 2019, 12/02/22 Previous ablations: None Anticoagulation history: Eliquis 5 mg BID   Past Medical History:  Diagnosis Date   A-fib (HCC) 03/20/2018   Acute blood loss anemia    Anxiety    Benign prostatic hyperplasia with urinary retention    Cerebrovascular accident (CVA) (HCC)    CHF (congestive heart failure) (HCC)    COPD (chronic obstructive pulmonary disease) (HCC)    Diabetes mellitus type 2 in nonobese (HCC)    Dyspnea    Dysrhythmia    A-Fib   Essential hypertension 03/20/2018   Former tobacco use    H/O ulcerative colitis 1993   Leukocytosis    Osteomyelitis (HCC)    a. 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   PAF (paroxysmal atrial fibrillation) (HCC)    a. dx 03/2018 in setting of stroke, osteomyelitis.   Stage 3 chronic kidney disease (HCC)    pt/family unaware  Status post transmetatarsal amputation of left foot [facility])    Umbilical hernia    Past Surgical History:  Procedure Laterality Date   AMPUTATION Left 03/24/2018   Procedure: Transmetatarsal Amputation Left Foot;  Surgeon: Nadara Mustard, MD;  Location: [facility] OR;  Service: Orthopedics;  Laterality: Left;   BRONCHIAL BIOPSY  01/03/2023   Procedure: BRONCHIAL BIOPSIES;  Surgeon: Leslye Peer, MD;  Location:  [facility] ENDOSCOPY;  Service: Pulmonary;;   BRONCHIAL BRUSHINGS  01/03/2023   Procedure: BRONCHIAL BRUSHINGS;  Surgeon: Leslye Peer, MD;  Location: [facility] ENDOSCOPY;  Service: Pulmonary;;   BRONCHIAL NEEDLE ASPIRATION BIOPSY  01/03/2023   Procedure: BRONCHIAL NEEDLE ASPIRATION BIOPSIES;  Surgeon: Leslye Peer, MD;  Location: [facility] ENDOSCOPY;  Service: Pulmonary;;   CARDIOVERSION N/A 05/05/2018   Procedure: CARDIOVERSION;  Surgeon: Chilton Si, MD;  Location: [facility] ENDOSCOPY;  Service: Cardiovascular;  Laterality: N/A;   CARDIOVERSION N/A 12/02/2022   Procedure: CARDIOVERSION;  Surgeon: Jodelle Red, MD;  Location: [facility] ENDOSCOPY;  Service: Cardiovascular;  Laterality: N/A;   COLONOSCOPY  12/2017   benign, remission from ulcerative colitis in 90s   FIDUCIAL MARKER PLACEMENT  01/03/2023   Procedure: FIDUCIAL MARKER PLACEMENT;  Surgeon: Leslye Peer, MD;  Location: [facility] ENDOSCOPY;  Service: Pulmonary;;   I & D EXTREMITY Left 03/17/2018   Procedure: IRRIGATION AND DEBRIDEMENT FOOT;  Surgeon: Yolonda Kida, MD;  Location: [facility] OR;  Service: Orthopedics;  Laterality: Left;   STUMP REVISION Left 05/10/2018   Procedure: REVISION LEFT TRANSMETATARSAL AMPUTATION;  Surgeon: Nadara Mustard, MD;  Location: [facility] OR;  Service: Orthopedics;  Laterality: Left;   VIDEO BRONCHOSCOPY WITH RADIAL ENDOBRONCHIAL ULTRASOUND  01/03/2023   Procedure: VIDEO BRONCHOSCOPY WITH RADIAL ENDOBRONCHIAL ULTRASOUND;  Surgeon: Leslye Peer, MD;  Location: [facility] ENDOSCOPY;  Service: Pulmonary;;    Current Outpatient Medications  Medication Sig Dispense Refill   acetaminophen (TYLENOL) 500 MG tablet Take 1,000-1,500 mg by mouth every 8 (eight) hours as needed for moderate pain.     albuterol (VENTOLIN HFA) 108 (90 Base) MCG/ACT inhaler Inhale 2 puffs into the lungs every 6 (six) hours as needed for wheezing or shortness of breath. 24 g 1   apixaban (ELIQUIS) 5 MG TABS tablet Take 1 tablet (5 mg total) by mouth 2 (two) times daily.  Okay to restart this medication with your evening dose on 01/04/2023 180 tablet 1   diltiazem (CARDIZEM CD) 120 MG 24 hr capsule Take 1 capsule (120 mg total) by mouth daily. Take at breakfast. 90 capsule 1   escitalopram (LEXAPRO) 10 MG tablet Take 10 mg by mouth daily.     insulin aspart protamine - aspart (NOVOLOG 70/30 MIX) (70-30) 100 UNIT/ML FlexPen Inject 27 Units into the skin 2 (two) times daily with a meal. 15 mL 11   Insulin Pen Needle (PEN NEEDLES 3/16") 31G X 5 MM MISC 1 Pen by Does not apply route 2 (two) times daily before a meal. Before breakfast and dinner 100 each 1   loratadine (CLARITIN) 10 MG tablet Take 10 mg by mouth at bedtime.     melatonin 3 MG TABS tablet Take 6 mg by mouth at bedtime.     mesalamine (LIALDA) 1.2 g EC tablet Take 1.2 g by mouth daily.     metoprolol succinate (TOPROL-XL) 50 MG 24 hr tablet Take 1 tablet (50 mg total) by mouth every evening.     Multiple Vitamins-Minerals (PRESERVISION AREDS 2 PO) Take 1 capsule by mouth 2 (two) times daily.  rosuvastatin (CRESTOR) 10 MG tablet Take 0.5 tablets (5 mg total) by mouth daily. 45 tablet 1   senna-docusate (SENOKOT-S) 8.6-50 MG tablet Take 1 tablet by mouth at bedtime.     tamsulosin (FLOMAX) 0.4 MG CAPS capsule Take 1 capsule (0.4 mg total) by mouth 2 times daily at 12 noon and 4 pm. (Patient taking differently: Take 0.4 mg by mouth in the morning and at bedtime.) 60 capsule 0   Tiotropium Bromide-Olodaterol (STIOLTO RESPIMAT) 2.5-2.5 MCG/ACT AERS Inhale 2 puffs into the lungs daily. 4 g 11   trimethoprim (TRIMPEX) 100 MG tablet Take 1 tablet (100 mg total) by mouth at bedtime.  11   No current facility-administered medications for this encounter.    No Known Allergies  Social History   Socioeconomic History   Marital status: Married    Spouse name: Not on file   Number of children: Not on file   Years of education: Not on file   Highest education level: Bachelor's degree (e.g., BA, AB, BS)   Occupational History   Not on file  Tobacco Use   Smoking status: Former    Packs/day: 1.00    Years: 20.00    Additional pack years: 0.00    Total pack years: 20.00    Types: Cigarettes    Start date: 49    Quit date: 1985    Years since quitting: 39.3   Smokeless tobacco: Never   Tobacco comments:    Former smoker 01/26/23  Vaping Use   Vaping Use: Never used  Substance and Sexual Activity   Alcohol use: Yes    Alcohol/week: 6.0 standard drinks of alcohol    Types: 6 Cans of beer per week    Comment: 6 pack every weekend 01/26/23   Drug use: Never   Sexual activity: Not on file  Other Topics Concern   Not on file  Social History Narrative   Lives at home with his wife and two cats. Married for almost 45 years   Right handed   Caffeine: 1 cup every morning   Social Determinants of Health   Financial Resource Strain: Not on file  Food Insecurity: No Food Insecurity (12/08/2022)   Hunger Vital Sign    Worried About Running Out of Food in the Last Year: Never true    Ran Out of Food in the Last Year: Never true  Transportation Needs: No Transportation Needs (12/08/2022)   PRAPARE - Administrator, Civil Service (Medical): No    Lack of Transportation (Non-Medical): No  Physical Activity: Not on file  Stress: Not on file  Social Connections: Not on file  Intimate Partner Violence: Not At Risk (12/08/2022)   Humiliation, Afraid, Rape, and Kick questionnaire    Fear of Current or Ex-Partner: No    Emotionally Abused: No    Physically Abused: No    Sexually Abused: No     ROS- All systems are reviewed and negative except as per the HPI above.  Physical Exam: Vitals:   01/26/23 1329  BP: 126/70  Pulse: 76  Weight: 108.5 kg  Height: 6\' 2"  (1.88 m)    GEN- The patient is well appearing, alert and oriented x 3 today.   Head- normocephalic, atraumatic Eyes-  Sclera clear, conjunctiva pink Ears- hearing intact Oropharynx- clear Neck- supple, no  JVP Lymph- no cervical lymphadenopathy Lungs- Clear to ausculation bilaterally, normal work of breathing Heart- Irregular rate and rhythm, no murmurs, rubs or gallops, PMI not laterally displaced  GI- soft, NT, ND, + BS Extremities- no clubbing, cyanosis, or edema. Superficial wound on anterior right ankle that is slightly erythematous but does not appear weeping or indurated. Right lower ankle has mild nonpitting edema. MS- no significant deformity or atrophy Skin- no rash or lesion Psych- euthymic mood, full affect Neuro- strength and sensation are intact   Wt Readings from Last 3 Encounters:  01/26/23 108.5 kg  01/20/23 108.5 kg  01/19/23 106.5 kg    EKG today demonstrates  Vent. rate 76 BPM PR interval * ms QRS duration 148 ms QT/QTcB 428/481 ms P-R-T axes * -70 -31 Atrial fibrillation with a competing junctional pacemaker Left axis deviation Right bundle branch block Abnormal ECG When compared with ECG of 27-Dec-2022 13:43, PREVIOUS ECG IS PRESENT  Echo 12/08/22 demonstrated: 1. Left ventricular ejection fraction, by estimation, is 55 to 60%. The  left ventricle has normal function. The left ventricle has no regional  wall motion abnormalities. Left ventricular diastolic parameters are  indeterminate. There is the  interventricular septum is flattened in systole, suggestive of right  ventricular pressure overload.   2. Right ventricular systolic function is mildly reduced. The right  ventricular size is moderately enlarged. There is normal pulmonary artery  systolic pressure. The estimated right ventricular systolic pressure is  30.5 mmHg.   3. Left atrial size was mildly dilated.   4. Right atrial size was moderately dilated.   5. The mitral valve is grossly normal. Trivial mitral valve  regurgitation. No evidence of mitral stenosis.   6. The aortic valve is tricuspid. There is mild calcification of the  aortic valve. There is mild thickening of the aortic valve.  Aortic valve  regurgitation is not visualized. No aortic stenosis is present.   7. The inferior vena cava is normal in size with greater than 50%  respiratory variability, suggesting right atrial pressure of 3 mmHg.    Epic records are reviewed at length today.  CHA2DS2-VASc Score = 8  The patient's score is based upon: CHF History: 1 HTN History: 1 Diabetes History: 1 Stroke History: 2 Vascular Disease History: 1 Age Score: 2 Gender Score: 0       ASSESSMENT AND PLAN: Persistent Atrial Fibrillation (ICD10:  I48.0) The patient's CHA2DS2-VASc score is 8, indicating a 10.8% annual risk of stroke.    He is in Afib today.  Discussion with radiation oncology PA regarding timing of radiation therapy in terms of starting Tikosyn. The plan is to begin radiation therapy on 5/20 and currently not thinking surgery or chemotherapy. Chemotherapy medications can possibly prolong the QT interval and thus would be contraindicated with Tikosyn which is why I doublechecked.  His therapy will start on 5/20. We will go ahead and schedule him for Tikosyn admission prior to then. He is interested in getting off Lexapro because it is not helping and advised PCP can consider mood stabilizing medications such as Cymbalta or Effexor that do not affect the QT interval.   CrCl estimated 68 mL/min via creatinine 1.36 on 4/8 lab would make him eligible for 500 mcg BID.   He will contact PCP regarding Lexapro medication switch. Will defer any other medication changes pending pharmacist review.  Continue diltiazem and metoprolol daily.  2. 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  Continue Apixaban (Eliquis).  No missed doses.  3. HTN Stable today; no changes.    Will schedule patient for Tikosyn admission.  Lake Bells, PA-C Afib Clinic [facility] [address] [place], [place] [address] [phone] 01/26/2023 1:54 PM

## 2023-01-27 ENCOUNTER — Encounter (HOSPITAL_COMMUNITY)
Admission: RE | Admit: 2023-01-27 | Discharge: 2023-01-27 | Disposition: A | Discharge status: Home / Self Care | Payer: Medicare Other | Source: Ambulatory Visit | Attending: Emergency Medicine | Admitting: Emergency Medicine

## 2023-01-27 DIAGNOSIS — C3411 Malignant neoplasm of upper lobe, right bronchus or lung: Secondary | ICD-10-CM | POA: Insufficient documentation

## 2023-01-27 DIAGNOSIS — C3490 Malignant neoplasm of unspecified part of unspecified bronchus or lung: Secondary | ICD-10-CM | POA: Insufficient documentation

## 2023-01-27 DIAGNOSIS — J90 Pleural effusion, not elsewhere classified: Secondary | ICD-10-CM | POA: Insufficient documentation

## 2023-01-27 LAB — GLUCOSE, CAPILLARY: Glucose-Capillary: 138 mg/dL — ABNORMAL HIGH (ref 70–99)

## 2023-01-27 MED ORDER — FLUDEOXYGLUCOSE F - 18 (FDG) INJECTION
12.0000 | Freq: Once | INTRAVENOUS | Status: AC | PRN
  Administered 2023-01-27: 12 via INTRAVENOUS

## 2023-01-28 NOTE — Telephone Encounter (Signed)
Pt being switched to macrobid 100mg  daily at bedtime to replace Trimethoprim.

## 2023-02-01 ENCOUNTER — Telehealth: Payer: Self-pay | Admitting: Physician Assistant

## 2023-02-01 ENCOUNTER — Ambulatory Visit (HOSPITAL_COMMUNITY)
Admission: EM | Admit: 2023-02-01 | Discharge: 2023-02-01 | Disposition: A | Discharge status: Home / Self Care | Payer: Medicare Other

## 2023-02-01 ENCOUNTER — Encounter (HOSPITAL_COMMUNITY): Payer: Self-pay | Admitting: *Deleted

## 2023-02-01 ENCOUNTER — Ambulatory Visit
Admission: RE | Admit: 2023-02-01 | Discharge: 2023-02-01 | Disposition: A | Discharge status: Home / Self Care | Payer: Medicare Other | Source: Ambulatory Visit | Attending: Radiation Oncology | Admitting: Radiation Oncology

## 2023-02-01 ENCOUNTER — Encounter: Payer: Self-pay | Admitting: Physician Assistant

## 2023-02-01 ENCOUNTER — Telehealth: Payer: Self-pay | Admitting: Radiation Oncology

## 2023-02-01 ENCOUNTER — Encounter (HOSPITAL_COMMUNITY): Payer: Self-pay

## 2023-02-01 DIAGNOSIS — C3411 Malignant neoplasm of upper lobe, right bronchus or lung: Secondary | ICD-10-CM | POA: Diagnosis not present

## 2023-02-01 DIAGNOSIS — Z510 Encounter for antineoplastic radiation therapy: Secondary | ICD-10-CM | POA: Diagnosis not present

## 2023-02-01 DIAGNOSIS — C3412 Malignant neoplasm of upper lobe, left bronchus or lung: Secondary | ICD-10-CM | POA: Diagnosis not present

## 2023-02-01 DIAGNOSIS — L03115 Cellulitis of right lower limb: Secondary | ICD-10-CM

## 2023-02-01 DIAGNOSIS — Z87891 Personal history of nicotine dependence: Secondary | ICD-10-CM | POA: Diagnosis not present

## 2023-02-01 HISTORY — DX: Malignant neoplasm of unspecified part of unspecified bronchus or lung: C34.90

## 2023-02-01 MED ORDER — MUPIROCIN CALCIUM 2 % EX CREA: 1.0000 | TOPICAL_CREAM | Freq: Two times a day (BID) | CUTANEOUS | 0 refills | Status: DC

## 2023-02-01 MED ORDER — CEPHALEXIN 250 MG PO CAPS: 250.0000 mg | ORAL_CAPSULE | Freq: Four times a day (QID) | ORAL | 0 refills | Status: DC

## 2023-02-01 NOTE — ED Provider Notes (Signed)
[facility]    CSN: [id number] Arrival date & time: 02/01/23  1452      History   Chief Complaint Chief Complaint  Patient presents with   Wound Check    HPI Kevin [name] is a 82 y.o. male.   Patient brought into the clinic by his spouse over concern of potential right lower extremity infection.  About 9 days ago his wife noted some blisters to his right lower extremity, the next day the blisters burst and she noticed that his toes and ankle were red and swollen.  He did have some leftover Keflex, she gave him about 3 doses and reports this helped with the redness and the swelling.  Shortly after that he went to his primary care provider where he was told that it was an allergic reaction and the Keflex had no impact on this.  His wife is concerned because the swelling, and redness remain in her extending up to his mid calf.  His blood sugars have been ranging in the 120s to the 170s after some medication adjustments after recent discharge from the hospital.  Wife has been using mupirocin ointment and keeping the wound clean and dry.  Patient's oxygen is 91% on 3 L via nasal cannula, this is his baseline.  He has a history of type 2 diabetes, lung cancer, CHF, COPD and multiple other chronic medical conditions.     The history is provided by the patient and medical records.  Wound Check Associated symptoms include shortness of breath. Pertinent negatives include no chest pain and no abdominal pain.    Past Medical History:  Diagnosis Date   A-fib (HCC) 03/20/2018   Acute blood loss anemia    Anxiety    Benign prostatic hyperplasia with urinary retention    Cerebrovascular accident (CVA) (HCC)    CHF (congestive heart failure) (HCC)    COPD (chronic obstructive pulmonary disease) (HCC)    Diabetes mellitus type 2 in nonobese (HCC)    Dyspnea    Dysrhythmia    A-Fib   Essential hypertension 03/20/2018   Former tobacco use    H/O ulcerative colitis 1993    Leukocytosis    Lung cancer (HCC)    Osteomyelitis (HCC)    a. 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   PAF (paroxysmal atrial fibrillation) (HCC)    a. dx 03/2018 in setting of stroke, osteomyelitis.   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   CAD (coronary artery disease) 12/09/2022   Mass of upper lobe of right lung 12/08/2022   Acute on chronic respiratory failure with hypoxia (HCC) 12/08/2022   History of recurrent UTI (urinary tract infection) 12/08/2022   DNR (do not resuscitate) 12/08/2022   Hypoxia 12/08/2022   Shortness of breath 12/08/2022   History of amputation of left foot (HCC) 06/09/2022   Cough 05/18/2022   Pulmonary nodules/lesions, multiple 01/08/2021   COPD (chronic obstructive pulmonary disease) (HCC) 06/26/2020   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   Chronic kidney disease, 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   Chronic respiratory failure (HCC) 08/20/2019   Pneumonia due to COVID-19 virus 08/19/2019  UTI (urinary tract infection) 12/15/2018   Acute on chronic diastolic CHF (congestive heart failure) (HCC) 12/15/2018   HLD (hyperlipidemia) 12/15/2018   Dehiscence of amputation stump (HCC)    Renal insufficiency 05/06/2018   Insulin dependent type 2 diabetes mellitus (HCC) 05/06/2018   Debility 03/31/2018   Urinary retention with incomplete bladder emptying 03/31/2018   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 (HCC)    Diabetic foot ulcer (HCC)    Status post transmetatarsal amputation of left foot (HCC)    Postoperative pain    Benign prostatic hyperplasia with urinary  retention    New onset atrial fibrillation (HCC)    Incontinence of feces    Cerebrovascular accident (CVA) (HCC)    Benign essential HTN    Diabetes mellitus type 2 in nonobese (HCC)    Hypokalemia    PAF (paroxysmal atrial fibrillation) (HCC)    Leukocytosis    SIRS (systemic inflammatory response syndrome) (HCC)    AKI (acute kidney injury) (HCC)    Essential hypertension 03/20/2018   Tachycardia 03/20/2018   Sepsis (HCC) 03/20/2018   Diabetes (HCC) 03/20/2018   Osteomyelitis (HCC) 03/20/2018   A-fib (HCC) 03/20/2018   Stroke-like episode (HCC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 03/16/2018   Strain of left tibialis anterior muscle 12/19/2015   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   Fracture of radius, distal, left, closed 12/19/2012   H/O ulcerative colitis 1993    Past Surgical History:  Procedure Laterality Date   AMPUTATION Left 03/24/2018   Procedure: Transmetatarsal Amputation Left Foot;  Surgeon: Nadara Mustard, MD;  Location: [facility] OR;  Service: Orthopedics;  Laterality: Left;   BRONCHIAL BIOPSY  01/03/2023   Procedure: BRONCHIAL BIOPSIES;  Surgeon: Leslye Peer, MD;  Location: [facility] ENDOSCOPY;  Service: Pulmonary;;   BRONCHIAL BRUSHINGS  01/03/2023   Procedure: BRONCHIAL BRUSHINGS;  Surgeon: Leslye Peer, MD;  Location: [facility] ENDOSCOPY;  Service: Pulmonary;;   BRONCHIAL NEEDLE ASPIRATION BIOPSY  01/03/2023   Procedure: BRONCHIAL NEEDLE ASPIRATION BIOPSIES;  Surgeon: Leslye Peer, MD;  Location: [facility] ENDOSCOPY;  Service: Pulmonary;;   CARDIOVERSION N/A 05/05/2018   Procedure: CARDIOVERSION;  Surgeon: Chilton Si, MD;  Location: [facility] ENDOSCOPY;  Service: Cardiovascular;  Laterality: N/A;   CARDIOVERSION N/A 12/02/2022   Procedure: CARDIOVERSION;  Surgeon: Jodelle Red, MD;  Location: [facility] ENDOSCOPY;  Service: Cardiovascular;  Laterality: N/A;   COLONOSCOPY  12/2017   benign, remission from ulcerative colitis in 90s   FIDUCIAL MARKER PLACEMENT  01/03/2023   Procedure: FIDUCIAL MARKER  PLACEMENT;  Surgeon: Leslye Peer, MD;  Location: [facility] ENDOSCOPY;  Service: Pulmonary;;   I & D EXTREMITY Left 03/17/2018   Procedure: IRRIGATION AND DEBRIDEMENT FOOT;  Surgeon: Yolonda Kida, MD;  Location: [facility] OR;  Service: Orthopedics;  Laterality: Left;   STUMP REVISION Left 05/10/2018   Procedure: REVISION LEFT TRANSMETATARSAL AMPUTATION;  Surgeon: Nadara Mustard, MD;  Location: [facility] OR;  Service: Orthopedics;  Laterality: Left;   VIDEO BRONCHOSCOPY WITH RADIAL ENDOBRONCHIAL ULTRASOUND  01/03/2023   Procedure: VIDEO BRONCHOSCOPY WITH RADIAL ENDOBRONCHIAL ULTRASOUND;  Surgeon: Leslye Peer, MD;  Location: [facility] ENDOSCOPY;  Service: Pulmonary;;       Home Medications    Prior to Admission medications   Medication Sig Start Date End Date Taking? Authorizing Provider  albuterol (VENTOLIN HFA) 108 (90 Base) MCG/ACT inhaler Inhale 2 puffs into the lungs every 6 (six) hours as needed for wheezing or shortness of breath. 06/03/20  Yes Kalman Shan, MD  apixaban (ELIQUIS) 5 MG TABS  tablet Take 1 tablet (5 mg total) by mouth 2 (two) times daily. Okay to restart this medication with your evening dose on 01/04/2023 01/03/23  Yes Byrum, Les Pou, MD  cephALEXin (KEFLEX) 250 MG capsule Take 1 capsule (250 mg total) by mouth every 6 (six) hours for 7 days. 250 mg orally every 6 hours 02/01/23 02/08/23 Yes Rinaldo Ratel, [place], FNP  diltiazem (CARDIZEM CD) 120 MG 24 hr capsule Take 1 capsule (120 mg total) by mouth daily. Take at breakfast. 12/23/22  Yes Kathleene Hazel, MD  insulin aspart protamine - aspart (NOVOLOG 70/30 MIX) (70-30) 100 UNIT/ML FlexPen Inject 27 Units into the skin 2 (two) times daily with a meal. 12/13/22  Yes Gonfa, Taye T, MD  loratadine (CLARITIN) 10 MG tablet Take 10 mg by mouth at bedtime.   Yes [provider]  melatonin 3 MG TABS tablet Take 6 mg by mouth at bedtime.   Yes [provider]  mesalamine (LIALDA) 1.2 g EC tablet Take 1.2 g by mouth daily.  05/07/20  Yes [provider]  metoprolol succinate (TOPROL-XL) 50 MG 24 hr tablet Take 1 tablet (50 mg total) by mouth every evening. 01/03/23  Yes Leslye Peer, MD  mupirocin cream (BACTROBAN) 2 % Apply 1 Application topically 2 (two) times daily. 02/01/23  Yes Rinaldo Ratel, [place], FNP  rosuvastatin (CRESTOR) 10 MG tablet Take 0.5 tablets (5 mg total) by mouth daily. 12/23/22  Yes Kathleene Hazel, MD  senna-docusate (SENOKOT-S) 8.6-50 MG tablet Take 1 tablet by mouth at bedtime. 01/03/23  Yes Leslye Peer, MD  tamsulosin (FLOMAX) 0.4 MG CAPS capsule Take 1 capsule (0.4 mg total) by mouth 2 times daily at 12 noon and 4 pm. Patient taking differently: Take 0.4 mg by mouth in the morning and at bedtime. 03/31/18  Yes Love, Evlyn Kanner, PA-C  Tiotropium Bromide-Olodaterol (STIOLTO RESPIMAT) 2.5-2.5 MCG/ACT AERS Inhale 2 puffs into the lungs daily. 10/30/21  Yes Parrett, Tammy S, NP  venlafaxine (EFFEXOR) 37.5 MG tablet Take 37.5 mg by mouth 2 (two) times daily.   Yes [provider]  acetaminophen (TYLENOL) 500 MG tablet Take 1,000-1,500 mg by mouth every 8 (eight) hours as needed for moderate pain.    [provider]  escitalopram (LEXAPRO) 10 MG tablet Take 10 mg by mouth daily. 04/06/22   [provider]  Insulin Pen Needle (PEN NEEDLES 3/16") 31G X 5 MM MISC 1 Pen by Does not apply route 2 (two) times daily before a meal. Before breakfast and dinner 12/13/22   Almon Hercules, MD  Multiple Vitamins-Minerals (PRESERVISION AREDS 2 PO) Take 1 capsule by mouth 2 (two) times daily.    [provider]  trimethoprim (TRIMPEX) 100 MG tablet Take 1 tablet (100 mg total) by mouth at bedtime. 12/20/22   Almon Hercules, MD    Family History Family History  Problem Relation Age of Onset   Hypertension Father     Social History Social History   Tobacco Use   Smoking status: Former    Packs/day: 1.00    Years: 20.00    Additional pack years: 0.00    Total pack  years: 20.00    Types: Cigarettes    Start date: 1963    Quit date: 1985    Years since quitting: 39.3   Smokeless tobacco: Never   Tobacco comments:    Former smoker 40 yrs ago  Vaping Use   Vaping Use: Never used  Substance Use Topics  Alcohol use: Yes    Alcohol/week: 6.0 standard drinks of alcohol    Types: 6 Cans of beer per week    Comment: 6 pack every weekend 01/26/23   Drug use: Never     Allergies   Other   Review of Systems Review of Systems  Constitutional:  Negative for chills, fatigue and fever.  HENT:  Negative for sore throat.   Respiratory:  Positive for shortness of breath. Negative for cough.   Cardiovascular:  Negative for chest pain.  Gastrointestinal:  Negative for abdominal pain.  Musculoskeletal:  Negative for back pain.  Skin:  Positive for color change and wound.     Physical Exam Triage Vital Signs ED Triage Vitals  Enc Vitals Group     BP 02/01/23 1547 115/73     Pulse --      Resp 02/01/23 1547 (!) 22     Temp 02/01/23 1547 97.6 F (36.4 C)     Temp Source 02/01/23 1547 Oral     SpO2 02/01/23 1547 (!) 85 %     Weight --      Height --      Head Circumference --      Peak Flow --      Pain Score 02/01/23 1552 2     Pain Loc --      Pain Edu? --      Excl. in GC? --    No data found.  Updated Vital Signs BP 115/73   Temp 97.6 F (36.4 C) (Oral)   Resp (!) 22   SpO2 (!) 85% Comment: pt normally wears O2 via [place] at 3L; has been without since arrival at [facility] -- pt placed on O2 at 3L upon triage  Visual Acuity Right Eye Distance:   Left Eye Distance:   Bilateral Distance:    Right Eye Near:   Left Eye Near:    Bilateral Near:     Physical Exam Vitals and nursing note reviewed.  Constitutional:      Appearance: Normal appearance.  HENT:     Head: Normocephalic and atraumatic.     Right Ear: External ear normal.     Left Ear: External ear normal.     Nose: Nose normal.     Mouth/Throat:     Mouth: Mucous membranes  are moist.  Eyes:     General: No scleral icterus.    Conjunctiva/sclera: Conjunctivae normal.  Cardiovascular:     Rate and Rhythm: Normal rate and regular rhythm.     Pulses:          Dorsalis pedis pulses are 2+ on the right side.       Posterior tibial pulses are 2+ on the right side.     Heart sounds: Normal heart sounds. No murmur heard. Pulmonary:     Effort: Pulmonary effort is normal. No respiratory distress.     Breath sounds: Wheezing present.  Musculoskeletal:        General: Swelling present. No tenderness, deformity or signs of injury. Normal range of motion.     Right lower leg: 2+ Pitting Edema present.     Left lower leg: 1+ Edema present.  Skin:    General: Skin is warm and dry.     Capillary Refill: Capillary refill takes less than 2 seconds.  Neurological:     General: No focal deficit present.     Mental Status: He is alert and oriented to person, place, and time.  Psychiatric:  Mood and Affect: Mood normal.        Behavior: Behavior normal. Behavior is cooperative.      UC Treatments / Results  Labs (all labs ordered are listed, but only abnormal results are displayed) Labs Reviewed - No data to display  EKG   Radiology No results found.  Procedures Procedures (including critical care time)  Medications Ordered in UC Medications - No data to display  Initial Impression / Assessment and Plan / UC Course  I have reviewed the triage vital signs and the nursing notes.  Pertinent labs & imaging results that were available during my care of the patient were reviewed by me and considered in my medical decision making (see chart for details).  Vitals and triage reviewed, patient is hemodynamically stable.  91% on 3 L, reports constant shortness of breath, no change from baseline, history of COPD, CHF and lung cancer.  Without tachycardia in room, heart rate in the 60s.  Open wounds to his right lower extremity, 2+ pitting edema in the right  lower extremity, 1+ nonpitting edema to left lower extremity.  Area of erythema surrounding an open blister to right ankle extending to his mid calf.  Suspect cellulitis, will cover with renally adjusted Keflex.  Advised to follow-up with primary care or the clinic for recheck.  Strict emergency room return precautions given, wife verbalized understanding, wound care discussed.  No questions at this time.    Final Clinical Impressions(s) / UC Diagnoses   Final diagnoses:  Cellulitis of right lower extremity     Discharge Instructions      Overall his physical exam is consistent with cellulitis.  Please take the Keflex every 6 hours for the next 7 days.  I suggest following up with his primary care within the next 6 or 7 days to ensure improvement and resolution of his infection.  He can also use the topical antibacterial ointment, keep the area clean and dry.  He can elevate the extremity to help with the swelling.  You can take Tylenol as needed for pain.  Please return to clinic or seek immediate care if he develops fever, worsening of infection despite being antibiotics for 72 hours, or any new concerning symptoms.      ED Prescriptions     Medication Sig Dispense Auth. Provider   mupirocin cream (BACTROBAN) 2 % Apply 1 Application topically 2 (two) times daily. 15 g Rinaldo Ratel, [place], [place]   cephALEXin (KEFLEX) 250 MG capsule Take 1 capsule (250 mg total) by mouth every 6 (six) hours for 7 days. 250 mg orally every 6 hours 28 capsule Vannie Hochstetler, [place] N, [place]      PDMP not reviewed this encounter.   Rickeya Manus, [place] N, [place] 02/01/23 [phone]

## 2023-02-01 NOTE — Discharge Instructions (Addendum)
Overall his physical exam is consistent with cellulitis.  Please take the Keflex every 6 hours for the next 7 days.  I suggest following up with his primary care within the next 6 or 7 days to ensure improvement and resolution of his infection.  He can also use the topical antibacterial ointment, keep the area clean and dry.  He can elevate the extremity to help with the swelling.  You can take Tylenol as needed for pain.  Please return to clinic or seek immediate care if he develops fever, worsening of infection despite being antibiotics for 72 hours, or any new concerning symptoms.

## 2023-02-01 NOTE — Telephone Encounter (Signed)
I called the patient and his wife to review the PET scan results. We plan to repeat a CT 6 weeks after SBRT to the RUL and LUL to follow up on the nodule that is likely inflammatory in the LLL. They are in agreement with this plan.

## 2023-02-01 NOTE — Telephone Encounter (Signed)
Thank you :)

## 2023-02-01 NOTE — ED Triage Notes (Signed)
Per spouse, she noticed "4 small blisters in a row" to right dorsal ankle 9 days ago; wife states there is now an open wound to area (pt is diabetic) with swelling to right ankle and foot. Wife states he also now has open wound to right dorsal second toe. Denies fevers. States saw PCP 6 days ago for same - was told he felt it was an allergic reaction, and did not prescribe anything.

## 2023-02-01 NOTE — Telephone Encounter (Signed)
I attempted to call the patient to review his CT scan as I had told him I would monitor for results. Unable to reach him on his mobile and home number. I left a voicemail to call back. I will be out of the office the remainder of the week. I wanted to reassure him that the plan is the same. The plan is to see radiation oncology for the two nodules. Then we will see him back in medical oncology in August a few days after a restaging CT scan for surveillance. Pending call back.

## 2023-02-02 ENCOUNTER — Other Ambulatory Visit: Payer: Self-pay | Admitting: Radiation Oncology

## 2023-02-02 DIAGNOSIS — C3480 Malignant neoplasm of overlapping sites of unspecified bronchus and lung: Secondary | ICD-10-CM

## 2023-02-04 ENCOUNTER — Encounter (HOSPITAL_COMMUNITY): Payer: Self-pay

## 2023-02-08 ENCOUNTER — Ambulatory Visit (HOSPITAL_COMMUNITY)
Admission: RE | Admit: 2023-02-08 | Discharge: 2023-02-08 | Disposition: A | Discharge status: Home / Self Care | Payer: Medicare Other | Source: Ambulatory Visit | Attending: Internal Medicine | Admitting: Internal Medicine

## 2023-02-08 ENCOUNTER — Inpatient Hospital Stay (HOSPITAL_COMMUNITY)
Admission: RE | Admit: 2023-02-08 | Discharge: 2023-02-11 | DRG: 309 | Disposition: A | Discharge status: Home / Self Care | Payer: Medicare Other | Source: Ambulatory Visit | Attending: Cardiology | Admitting: Cardiology

## 2023-02-08 ENCOUNTER — Other Ambulatory Visit: Payer: Self-pay

## 2023-02-08 ENCOUNTER — Other Ambulatory Visit (HOSPITAL_COMMUNITY): Payer: Self-pay

## 2023-02-08 ENCOUNTER — Telehealth (HOSPITAL_COMMUNITY): Payer: Self-pay | Admitting: Pharmacy Technician

## 2023-02-08 VITALS — BP 116/76 | HR 76 | Ht 74.0 in | Wt 247.2 lb

## 2023-02-08 DIAGNOSIS — J449 Chronic obstructive pulmonary disease, unspecified: Secondary | ICD-10-CM | POA: Diagnosis present

## 2023-02-08 DIAGNOSIS — D6869 Other thrombophilia: Secondary | ICD-10-CM | POA: Diagnosis present

## 2023-02-08 DIAGNOSIS — Z87891 Personal history of nicotine dependence: Secondary | ICD-10-CM

## 2023-02-08 DIAGNOSIS — I4819 Other persistent atrial fibrillation: Principal | ICD-10-CM

## 2023-02-08 DIAGNOSIS — E785 Hyperlipidemia, unspecified: Secondary | ICD-10-CM | POA: Diagnosis present

## 2023-02-08 DIAGNOSIS — C3411 Malignant neoplasm of upper lobe, right bronchus or lung: Secondary | ICD-10-CM | POA: Diagnosis not present

## 2023-02-08 DIAGNOSIS — Z7901 Long term (current) use of anticoagulants: Secondary | ICD-10-CM

## 2023-02-08 DIAGNOSIS — C3412 Malignant neoplasm of upper lobe, left bronchus or lung: Secondary | ICD-10-CM | POA: Diagnosis not present

## 2023-02-08 DIAGNOSIS — Z9981 Dependence on supplemental oxygen: Secondary | ICD-10-CM | POA: Diagnosis not present

## 2023-02-08 DIAGNOSIS — C3490 Malignant neoplasm of unspecified part of unspecified bronchus or lung: Secondary | ICD-10-CM | POA: Diagnosis present

## 2023-02-08 DIAGNOSIS — Z79899 Other long term (current) drug therapy: Secondary | ICD-10-CM | POA: Diagnosis not present

## 2023-02-08 DIAGNOSIS — I130 Hypertensive heart and chronic kidney disease with heart failure and stage 1 through stage 4 chronic kidney disease, or unspecified chronic kidney disease: Secondary | ICD-10-CM | POA: Diagnosis present

## 2023-02-08 DIAGNOSIS — E1151 Type 2 diabetes mellitus with diabetic peripheral angiopathy without gangrene: Secondary | ICD-10-CM | POA: Diagnosis present

## 2023-02-08 DIAGNOSIS — I5032 Chronic diastolic (congestive) heart failure: Secondary | ICD-10-CM | POA: Diagnosis present

## 2023-02-08 DIAGNOSIS — Z8249 Family history of ischemic heart disease and other diseases of the circulatory system: Secondary | ICD-10-CM

## 2023-02-08 DIAGNOSIS — Z89432 Acquired absence of left foot: Secondary | ICD-10-CM

## 2023-02-08 DIAGNOSIS — I4510 Unspecified right bundle-branch block: Secondary | ICD-10-CM | POA: Diagnosis present

## 2023-02-08 DIAGNOSIS — C3491 Malignant neoplasm of unspecified part of right bronchus or lung: Principal | ICD-10-CM

## 2023-02-08 DIAGNOSIS — N1832 Chronic kidney disease, stage 3b: Secondary | ICD-10-CM | POA: Diagnosis present

## 2023-02-08 DIAGNOSIS — N401 Enlarged prostate with lower urinary tract symptoms: Secondary | ICD-10-CM | POA: Diagnosis present

## 2023-02-08 DIAGNOSIS — Z8673 Personal history of transient ischemic attack (TIA), and cerebral infarction without residual deficits: Secondary | ICD-10-CM

## 2023-02-08 DIAGNOSIS — E1122 Type 2 diabetes mellitus with diabetic chronic kidney disease: Secondary | ICD-10-CM | POA: Diagnosis present

## 2023-02-08 DIAGNOSIS — Z7951 Long term (current) use of inhaled steroids: Secondary | ICD-10-CM

## 2023-02-08 DIAGNOSIS — R338 Other retention of urine: Secondary | ICD-10-CM | POA: Diagnosis present

## 2023-02-08 DIAGNOSIS — Z794 Long term (current) use of insulin: Secondary | ICD-10-CM | POA: Diagnosis not present

## 2023-02-08 DIAGNOSIS — F419 Anxiety disorder, unspecified: Secondary | ICD-10-CM | POA: Diagnosis present

## 2023-02-08 DIAGNOSIS — J9611 Chronic respiratory failure with hypoxia: Secondary | ICD-10-CM | POA: Diagnosis present

## 2023-02-08 LAB — BASIC METABOLIC PANEL
Anion gap: 9 (ref 5–15)
BUN: 22 mg/dL (ref 8–23)
CO2: 23 mmol/L (ref 22–32)
Calcium: 8.4 mg/dL — ABNORMAL LOW (ref 8.9–10.3)
Chloride: 107 mmol/L (ref 98–111)
Creatinine, Ser: 1.3 mg/dL — ABNORMAL HIGH (ref 0.61–1.24)
GFR, Estimated: 56 mL/min — ABNORMAL LOW (ref >60)
Glucose, Bld: 138 mg/dL — ABNORMAL HIGH (ref 70–99)
Potassium: 4.5 mmol/L (ref 3.5–5.1)
Sodium: 139 mmol/L (ref 135–145)

## 2023-02-08 LAB — GLUCOSE, CAPILLARY: Glucose-Capillary: 138 mg/dL — ABNORMAL HIGH (ref 70–99)

## 2023-02-08 LAB — MAGNESIUM: Magnesium: 2.4 mg/dL (ref 1.7–2.4)

## 2023-02-08 MED ORDER — APIXABAN 5 MG PO TABS
5.0000 mg | ORAL_TABLET | Freq: Two times a day (BID) | ORAL | Status: DC
  Administered 2023-02-08 – 2023-02-11 (×6): 5 mg via ORAL
  Filled 2023-02-08 (×6): qty 1

## 2023-02-08 MED ORDER — INSULIN ASPART PROT & ASPART (70-30 MIX) 100 UNIT/ML ~~LOC~~ SUSP
27.0000 [IU] | Freq: Two times a day (BID) | SUBCUTANEOUS | Status: DC
  Administered 2023-02-08 – 2023-02-11 (×5): 27 [IU] via SUBCUTANEOUS
  Filled 2023-02-08: qty 10

## 2023-02-08 MED ORDER — METOPROLOL SUCCINATE ER 50 MG PO TB24
50.0000 mg | ORAL_TABLET | Freq: Every evening | ORAL | Status: DC
  Administered 2023-02-08 – 2023-02-10 (×3): 50 mg via ORAL
  Filled 2023-02-08 (×3): qty 1

## 2023-02-08 MED ORDER — UMECLIDINIUM BROMIDE 62.5 MCG/ACT IN AEPB
1.0000 | INHALATION_SPRAY | Freq: Every day | RESPIRATORY_TRACT | Status: DC
  Administered 2023-02-09 – 2023-02-11 (×3): 1 via RESPIRATORY_TRACT
  Filled 2023-02-08: qty 7

## 2023-02-08 MED ORDER — TAMSULOSIN HCL 0.4 MG PO CAPS
0.4000 mg | ORAL_CAPSULE | Freq: Two times a day (BID) | ORAL | Status: DC
  Administered 2023-02-08 – 2023-02-11 (×6): 0.4 mg via ORAL
  Filled 2023-02-08 (×6): qty 1

## 2023-02-08 MED ORDER — MESALAMINE 1.2 G PO TBEC
1.2000 g | DELAYED_RELEASE_TABLET | Freq: Every day | ORAL | Status: DC
  Administered 2023-02-09 – 2023-02-11 (×3): 1.2 g via ORAL
  Filled 2023-02-08 (×3): qty 1

## 2023-02-08 MED ORDER — VENLAFAXINE HCL ER 37.5 MG PO CP24
37.5000 mg | ORAL_CAPSULE | Freq: Every day | ORAL | Status: DC
  Administered 2023-02-09 – 2023-02-11 (×3): 37.5 mg via ORAL
  Filled 2023-02-08 (×3): qty 1

## 2023-02-08 MED ORDER — DOFETILIDE 500 MCG PO CAPS
500.0000 ug | ORAL_CAPSULE | Freq: Two times a day (BID) | ORAL | Status: DC
  Administered 2023-02-08 – 2023-02-10 (×4): 500 ug via ORAL
  Filled 2023-02-08 (×4): qty 1

## 2023-02-08 MED ORDER — SODIUM CHLORIDE 0.9 % IV SOLN: 250.0000 mL | INTRAVENOUS | Status: DC | PRN

## 2023-02-08 MED ORDER — MUPIROCIN CALCIUM 2 % EX CREA
1.0000 | TOPICAL_CREAM | Freq: Two times a day (BID) | CUTANEOUS | Status: DC
  Administered 2023-02-10 – 2023-02-11 (×2): 1 via TOPICAL
  Filled 2023-02-08: qty 15

## 2023-02-08 MED ORDER — DILTIAZEM HCL ER COATED BEADS 120 MG PO CP24
120.0000 mg | ORAL_CAPSULE | Freq: Every day | ORAL | Status: DC
  Administered 2023-02-09 – 2023-02-11 (×3): 120 mg via ORAL
  Filled 2023-02-08 (×3): qty 1

## 2023-02-08 MED ORDER — ARFORMOTEROL TARTRATE 15 MCG/2ML IN NEBU
15.0000 ug | INHALATION_SOLUTION | Freq: Every day | RESPIRATORY_TRACT | Status: DC
  Administered 2023-02-09 – 2023-02-11 (×3): 15 ug via RESPIRATORY_TRACT
  Filled 2023-02-08 (×3): qty 2

## 2023-02-08 MED ORDER — SODIUM CHLORIDE 0.9% FLUSH
3.0000 mL | Freq: Two times a day (BID) | INTRAVENOUS | Status: DC
  Administered 2023-02-09 – 2023-02-11 (×4): 3 mL via INTRAVENOUS

## 2023-02-08 MED ORDER — SODIUM CHLORIDE 0.9% FLUSH: 3.0000 mL | INTRAVENOUS | Status: DC | PRN

## 2023-02-08 MED ORDER — SENNOSIDES-DOCUSATE SODIUM 8.6-50 MG PO TABS: 1.0000 | ORAL_TABLET | Freq: Every evening | ORAL | Status: DC | PRN

## 2023-02-08 MED ORDER — CEPHALEXIN 250 MG PO CAPS
250.0000 mg | ORAL_CAPSULE | Freq: Four times a day (QID) | ORAL | Status: AC
  Administered 2023-02-08 (×2): 250 mg via ORAL
  Filled 2023-02-08 (×2): qty 1

## 2023-02-08 MED ORDER — MELATONIN 3 MG PO TABS
6.0000 mg | ORAL_TABLET | Freq: Every day | ORAL | Status: DC
  Administered 2023-02-08 – 2023-02-10 (×3): 6 mg via ORAL
  Filled 2023-02-08 (×3): qty 2

## 2023-02-08 MED ORDER — ALBUTEROL SULFATE (2.5 MG/3ML) 0.083% IN NEBU: 3.0000 mL | INHALATION_SOLUTION | Freq: Four times a day (QID) | RESPIRATORY_TRACT | Status: DC | PRN

## 2023-02-08 MED ORDER — LORATADINE 10 MG PO TABS
10.0000 mg | ORAL_TABLET | Freq: Every day | ORAL | Status: DC
  Administered 2023-02-08 – 2023-02-10 (×3): 10 mg via ORAL
  Filled 2023-02-08 (×3): qty 1

## 2023-02-08 MED ORDER — ROSUVASTATIN CALCIUM 5 MG PO TABS
5.0000 mg | ORAL_TABLET | Freq: Every day | ORAL | Status: DC
  Administered 2023-02-09 – 2023-02-11 (×3): 5 mg via ORAL
  Filled 2023-02-08 (×3): qty 1

## 2023-02-08 MED ORDER — ACETAMINOPHEN 500 MG PO TABS: 1000.0000 mg | ORAL_TABLET | Freq: Every evening | ORAL | Status: DC | PRN

## 2023-02-08 MED ORDER — NITROFURANTOIN MONOHYD MACRO 100 MG PO CAPS
100.0000 mg | ORAL_CAPSULE | Freq: Every day | ORAL | Status: DC
  Administered 2023-02-08 – 2023-02-10 (×3): 100 mg via ORAL
  Filled 2023-02-08 (×5): qty 1

## 2023-02-08 NOTE — Care Management (Signed)
  Transition of Care [facility]) Screening Note   Patient Details  Name: Kevin [name] Date of Birth: Dec 26, 1940   Transition of Care [facility]) CM/SW Contact:    Gala Lewandowsky, RN Phone Number: 02/08/2023, 3:15 PM    Transition of Care Department [facility]) has reviewed the patient. Patient presented for Tikosyn Load. Benefits check submitted for cost. Case Manager will follow for cost and pharmacy of choice as the patient progresses.

## 2023-02-08 NOTE — H&P (Signed)
Primary Care Physician: Eustace Pen, PA-C Primary Cardiologist: Dr. Clifton James Primary Electrophysiologist: None Referring Physician:    [name] is a 82 y.o. 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 History of cardioversion in 2019. Seen by Cardiology on 11/25/22 for shortness of breath and was noted to be in Afib without RVR. He underwent DCCV on 12/02/22 with successful conversion to normal rhythm after 2 shocks. He was recently admitted 3/12-18/24 for acute on chronic respiratory failure with hypoxia in the setting of COPD and CHF exacerbation and atrial fibrillation. Patient is on Eliquis 5 mg BID for a CHADS2VASC score of 8.  On evaluation 12/27/22, he is in Afib today. Wife and patient agree that he seems to be more short of breath when he is in Afib. He uses oxygen at night. Review of records demonstrate he underwent DCCV on 3/7 and most likely went back into Afib on 3/12. He has been compliant with Eliquis and has not missed any doses. He has a scheduled bronchoscopy for RUL nodule on 4/8. He Meda Dudzinski stop taking Eliquis on 4/5 prior to procedure.   He drinks several beers on the weekend.   On follow up today 5/1, he is in Afib. 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 He is scheduled for stereotactic body radiotherapy (SBRT) for this finding on 5/20. He is more short of breath than usual when in Afib. He has not missed any doses of anticoagulation.  On follow up 5/14, patient is in Afib and here today for Tikosyn admission. He has not used benadryl in the past few days. He has not missed any doses  of anticoagulation. He is currently finishing prescription of keflex for cellulitis of lower extremity and only has 2 doses left after today's morning dose. He did transition to macrobid daily. He is currently on effexor and started it on 5/5; notes the current initial dose of 37.5 mg is not helping with anxiety. He is more short of breath as noted before while in Afib.    Today, he denies symptoms of palpitations, chest pain, orthopnea, PND, lower extremity edema, dizziness, presyncope, syncope, snoring, daytime somnolence, bleeding, or neurologic sequela. The patient is tolerating medications without difficulties and is otherwise without complaint today.    Atrial Fibrillation Risk Factors:  he does not have symptoms or diagnosis of sleep apnea. he does not have a history of rheumatic fever. he does not have a history of alcohol use. The patient does not have a history of early familial atrial fibrillation or other arrhythmias.  he has a BMI of Body mass index is 31.74 kg/m.Marland Kitchen Filed Weights   02/08/23 1631  Weight: 112.1 kg     Family History  Problem Relation Age of Onset   Hypertension Father      Atrial Fibrillation Management history:  Previous antiarrhythmic drugs: None Previous cardioversions: 2019, 12/02/22 Previous ablations: None Anticoagulation history: Eliquis 5 mg BID   Past Medical History:  Diagnosis Date   A-fib (HCC) 03/20/2018   Acute blood loss anemia    Anxiety    Benign prostatic hyperplasia with urinary retention    Cerebrovascular accident (CVA) (HCC)    CHF (congestive  heart failure) (HCC)    COPD (chronic obstructive pulmonary disease) (HCC)    Diabetes mellitus type 2 in nonobese (HCC)    Dyspnea    Dysrhythmia    A-Fib   Essential hypertension 03/20/2018   Former tobacco use    H/O ulcerative colitis 1993   Leukocytosis    Lung cancer (HCC)    Osteomyelitis (HCC)    a. 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   PAF (paroxysmal atrial  fibrillation) (HCC)    a. dx 03/2018 in setting of stroke, osteomyelitis.   Stage 3 chronic kidney disease (HCC)    pt/family unaware   Status post transmetatarsal amputation of left foot [facility])    Umbilical hernia    Past Surgical History:  Procedure Laterality Date   AMPUTATION Left 03/24/2018   Procedure: Transmetatarsal Amputation Left Foot;  Surgeon: Nadara Mustard, MD;  Location: [facility] OR;  Service: Orthopedics;  Laterality: Left;   BRONCHIAL BIOPSY  01/03/2023   Procedure: BRONCHIAL BIOPSIES;  Surgeon: Leslye Peer, MD;  Location: [facility] ENDOSCOPY;  Service: Pulmonary;;   BRONCHIAL BRUSHINGS  01/03/2023   Procedure: BRONCHIAL BRUSHINGS;  Surgeon: Leslye Peer, MD;  Location: [facility] ENDOSCOPY;  Service: Pulmonary;;   BRONCHIAL NEEDLE ASPIRATION BIOPSY  01/03/2023   Procedure: BRONCHIAL NEEDLE ASPIRATION BIOPSIES;  Surgeon: Leslye Peer, MD;  Location: [facility] ENDOSCOPY;  Service: Pulmonary;;   CARDIOVERSION N/A 05/05/2018   Procedure: CARDIOVERSION;  Surgeon: Chilton Si, MD;  Location: [facility] ENDOSCOPY;  Service: Cardiovascular;  Laterality: N/A;   CARDIOVERSION N/A 12/02/2022   Procedure: CARDIOVERSION;  Surgeon: Jodelle Red, MD;  Location: [facility] ENDOSCOPY;  Service: Cardiovascular;  Laterality: N/A;   COLONOSCOPY  12/2017   benign, remission from ulcerative colitis in 90s   FIDUCIAL MARKER PLACEMENT  01/03/2023   Procedure: FIDUCIAL MARKER PLACEMENT;  Surgeon: Leslye Peer, MD;  Location: [facility] ENDOSCOPY;  Service: Pulmonary;;   I & D EXTREMITY Left 03/17/2018   Procedure: IRRIGATION AND DEBRIDEMENT FOOT;  Surgeon: Yolonda Kida, MD;  Location: [facility] OR;  Service: Orthopedics;  Laterality: Left;   STUMP REVISION Left 05/10/2018   Procedure: REVISION LEFT TRANSMETATARSAL AMPUTATION;  Surgeon: Nadara Mustard, MD;  Location: [facility] OR;  Service: Orthopedics;  Laterality: Left;   VIDEO BRONCHOSCOPY WITH RADIAL ENDOBRONCHIAL ULTRASOUND  01/03/2023   Procedure: VIDEO BRONCHOSCOPY WITH RADIAL  ENDOBRONCHIAL ULTRASOUND;  Surgeon: Leslye Peer, MD;  Location: [facility] ENDOSCOPY;  Service: Pulmonary;;    Current Facility-Administered Medications  Medication Dose Route Frequency Provider Last Rate Last Admin   0.9 %  sodium chloride infusion  250 mL Intravenous PRN Eustace Pen, PA-C       acetaminophen (TYLENOL) tablet 1,000 mg  1,000 mg Oral QHS PRN Eustace Pen, PA-C       albuterol (PROVENTIL) (2.5 MG/3ML) 0.083% nebulizer solution 3 mL  3 mL Inhalation Q6H PRN Eustace Pen, PA-C       apixaban Everlene Balls) tablet 5 mg  5 mg Oral BID Eustace Pen, PA-C       [START ON 02/09/2023] arformoterol [facility]) nebulizer solution 15 mcg  15 mcg Nebulization Q2000 Eustace Pen, PA-C       And   [START ON 02/09/2023] umeclidinium bromide (INCRUSE ELLIPTA) 62.5 MCG/ACT 1 puff  1 puff Inhalation Daily Eustace Pen, PA-C       cephALEXin (KEFLEX) capsule 250 mg  250 mg Oral Q6H Eustace Pen, PA-C   250 mg at 02/08/23 1703   [START ON 02/09/2023] diltiazem (  CARDIZEM CD) 24 hr capsule 120 mg  120 mg Oral Daily Eustace Pen, PA-C       dofetilide (TIKOSYN) capsule 500 mcg  500 mcg Oral BID Eustace Pen, PA-C       insulin aspart protamine- aspart (NOVOLOG MIX 70/30) injection 27 Units  27 Units Subcutaneous BID WC Eustace Pen, PA-C   27 Units at 02/08/23 1703   loratadine (CLARITIN) tablet 10 mg  10 mg Oral QHS Eustace Pen, PA-C       melatonin tablet 6 mg  6 mg Oral QHS Eustace Pen, PA-C       [START ON 02/09/2023] mesalamine (LIALDA) EC tablet 1.2 g  1.2 g Oral Daily Eustace Pen, PA-C       metoprolol succinate (TOPROL-XL) 24 hr tablet 50 mg  50 mg Oral QPM Eustace Pen, PA-C   50 mg at 02/08/23 1708   mupirocin cream (BACTROBAN) 2 % 1 Application  1 Application Topical BID Eustace Pen, PA-C       nitrofurantoin (macrocrystal-monohydrate) (MACROBID) capsule 100 mg  100 mg Oral QHS Eustace Pen, PA-C       [START ON 02/09/2023] rosuvastatin  (CRESTOR) tablet 5 mg  5 mg Oral Daily Eustace Pen, PA-C       senna-docusate (Senokot-S) tablet 1 tablet  1 tablet Oral QHS PRN Eustace Pen, PA-C       sodium chloride flush (NS) 0.9 % injection 3 mL  3 mL Intravenous Q12H Eustace Pen, PA-C       sodium chloride flush (NS) 0.9 % injection 3 mL  3 mL Intravenous PRN Eustace Pen, PA-C       tamsulosin [facility]) capsule 0.4 mg  0.4 mg Oral q12n4p Eustace Pen, PA-C       [START ON 02/09/2023] venlafaxine XR (EFFEXOR-XR) 24 hr capsule 37.5 mg  37.5 mg Oral Daily Eustace Pen, PA-C        Allergies  Allergen Reactions   Other     Per spouse, pt needs to avoid any abx that could potentially cause prolonged QT syndrome    Social History   Socioeconomic History   Marital status: Married    Spouse name: Not on file   Number of children: Not on file   Years of education: Not on file   Highest education level: Bachelor's degree (e.g., BA, AB, BS)  Occupational History   Not on file  Tobacco Use   Smoking status: Former    Packs/day: 1.00    Years: 20.00    Additional pack years: 0.00    Total pack years: 20.00    Types: Cigarettes    Start date: 1963    Quit date: 1985    Years since quitting: 39.3   Smokeless tobacco: Never   Tobacco comments:    Former smoker 40 yrs ago  Vaping Use   Vaping Use: Never used  Substance and Sexual Activity   Alcohol use: Yes    Alcohol/week: 6.0 standard drinks of alcohol    Types: 6 Cans of beer per week    Comment: 6 pack every weekend 01/26/23   Drug use: Never   Sexual activity: Not on file  Other Topics Concern   Not on file  Social History Narrative   Lives at home with his wife and two cats. Married for almost 45 years   Right handed   Caffeine: 1 cup every morning  Social Determinants of Health   Financial Resource Strain: Not on file  Food Insecurity: No Food Insecurity (02/08/2023)   Hunger Vital Sign    Worried About Running Out of Food in the Last  Year: Never true    Ran Out of Food in the Last Year: Never true  Transportation Needs: No Transportation Needs (02/08/2023)   PRAPARE - Administrator, Civil Service (Medical): No    Lack of Transportation (Non-Medical): No  Physical Activity: Not on file  Stress: Not on file  Social Connections: Not on file  Intimate Partner Violence: Not At Risk (02/08/2023)   Humiliation, Afraid, Rape, and Kick questionnaire    Fear of Current or Ex-Partner: No    Emotionally Abused: No    Physically Abused: No    Sexually Abused: No     ROS- All systems are reviewed and negative except as per the HPI above.  Physical Exam: Vitals:   02/08/23 1514 02/08/23 1602 02/08/23 1631 02/08/23 1708  BP: 119/63 116/71  124/66  Pulse: 77 61  83  Resp: 18 17    Temp: 97.7 F (36.5 C) 98.6 F (37 C)    TempSrc: Oral Oral    SpO2: 100%     Weight:   112.1 kg   Height:   6\' 2"  (1.88 m)     GEN- The patient is well appearing, alert and oriented x 3 today. Sitting in wheelchair Head- normocephalic, atraumatic Eyes-  Sclera clear, conjunctiva pink Ears- hearing intact Oropharynx- clear Neck- supple, no JVP Lymph- no cervical lymphadenopathy Lungs- Clear to ausculation bilaterally, normal work of breathing Heart- Irregular rate and rhythm, no murmurs, rubs or gallops, PMI not laterally displaced GI- soft, NT, ND, + BS. Umbilical hernia noted. Extremities- no clubbing, cyanosis, or edema MS- no significant deformity or atrophy Skin- no rash or lesion Psych- euthymic mood, full affect Neuro- strength and sensation are intact   Wt Readings from Last 3 Encounters:  02/08/23 112.1 kg  02/08/23 112.1 kg  01/26/23 108.5 kg    EKG today demonstrates  Vent. rate 76 BPM PR interval * ms QRS duration 158 ms QT/QTcB 436/490 ms P-R-T axes * -85 -26 Atrial fibrillation Right bundle branch block Left anterior fascicular block Bifascicular block  Abnormal ECG When compared with ECG of  26-Jan-2023 13:32, PREVIOUS ECG IS PRESENT  Echo 12/08/22 demonstrated: 1. Left ventricular ejection fraction, by estimation, is 55 to 60%. The  left ventricle has normal function. The left ventricle has no regional  wall motion abnormalities. Left ventricular diastolic parameters are  indeterminate. There is the  interventricular septum is flattened in systole, suggestive of right  ventricular pressure overload.   2. Right ventricular systolic function is mildly reduced. The right  ventricular size is moderately enlarged. There is normal pulmonary artery  systolic pressure. The estimated right ventricular systolic pressure is  30.5 mmHg.   3. Left atrial size was mildly dilated.   4. Right atrial size was moderately dilated.   5. The mitral valve is grossly normal. Trivial mitral valve  regurgitation. No evidence of mitral stenosis.   6. The aortic valve is tricuspid. There is mild calcification of the  aortic valve. There is mild thickening of the aortic valve. Aortic valve  regurgitation is not visualized. No aortic stenosis is present.   7. The inferior vena cava is normal in size with greater than 50%  respiratory variability, suggesting right atrial pressure of 3 mmHg.    Epic records  are reviewed at length today.  CHA2DS2-VASc Score = 8  The patient's score is based upon: CHF History: 1 HTN History: 1 Diabetes History: 1 Stroke History: 2 Vascular Disease History: 1 Age Score: 2 Gender Score: 0       ASSESSMENT AND PLAN: Persistent Atrial Fibrillation (ICD10:  I48.0) The patient's CHA2DS2-VASc score is 8, indicating a 10.8% annual risk of stroke.    He is in Afib today.  Patient would like to pursue dofetilide and presents for dofetilide admission. Continue Eliquis 5 mg BID, states no missed doses in the last 3 weeks. No recent benadryl use. PharmD has screened medications - he has transitioned to macrobid and effexor which do not interact with Tikosyn.  QTc in  SR 455 ms (ECG 12/02/22) Labs today show creatinine at 1.3, K+ 4.5 and mag 2.4, CrCl calculated at 73.  He is eligible for 500 mcg BID dose.  Discussion with radiation oncology PA regarding timing of radiation therapy in terms of starting Tikosyn. The plan is to begin radiation therapy on 5/20 and currently not thinking surgery or chemotherapy. Chemotherapy medications can possibly prolong the QT interval and thus would be contraindicated with Tikosyn which is why I doublechecked.  Continue diltiazem and metoprolol daily.  2. 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  Continue Apixaban (Eliquis).  No missed doses.  3. HTN Stable today; no changes.    Patient Johanna Stafford go home and wait to be contacted by admissions when room is available.    Lake Bells, PA-C Afib Clinic [facility] [address] [place], [place] [address] [phone] 02/08/2023 6:51 PM  I have seen and examined this patient with Lake Bells.  Agree with above, note added to reflect my findings.  Presented to the hospital for tikosyn load. All questions answered. Patient with weakness and fatigue while in atrial fibrillation.  GEN: Well nourished, well developed, in no acute distress  HEENT: normal  Neck: no JVD, carotid bruits, or masses Cardiac: irregular; no murmurs, rubs, or gallops,no edema  Respiratory:  clear to auscultation bilaterally, normal work of breathing GI: soft, nontender, nondistended, + BS MS: no deformity or atrophy  Skin: warm and dry Neuro:  Strength and sensation are intact Psych: euthymic mood, full affect   Persistent atrial fibrillation: plan for tikosyn load. QTc acceptable. Julie-Anne Torain plan for 500 mcg with ECG after each dose. Keep k>4, mg>2.   Raylee Adamec M. Isaiahs Chancy MD 02/08/2023 6:51 PM

## 2023-02-08 NOTE — TOC Benefit Eligibility Note (Signed)
Patient Product/process development scientist completed.    The patient is currently admitted and upon discharge could be taking dofetilide (Tikosyn) 500 mcg capsules .  The current 30 day co-pay is $11.84.   The patient is insured through [employer]   This test claim was processed through [facility]- copay amounts may vary at other pharmacies due to pharmacy/plan contracts, or as the patient moves through the different stages of their insurance plan.  Roland Earl, CPHT Pharmacy Patient Advocate Specialist [facility] Patient Advocate Team Direct Number: [phone]  Fax: [phone]

## 2023-02-08 NOTE — Progress Notes (Signed)
Primary Care Physician: Blair Heys, MD Primary Cardiologist: Dr. Clifton James Primary Electrophysiologist: None Referring Physician:    [name] is a 82 y.o. 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 History of cardioversion in 2019. Seen by Cardiology on 11/25/22 for shortness of breath and was noted to be in Afib without RVR. He underwent DCCV on 12/02/22 with successful conversion to normal rhythm after 2 shocks. He was recently admitted 3/12-18/24 for acute on chronic respiratory failure with hypoxia in the setting of COPD and CHF exacerbation and atrial fibrillation. Patient is on Eliquis 5 mg BID for a CHADS2VASC score of 8.  On evaluation 12/27/22, he is in Afib today. Wife and patient agree that he seems to be more short of breath when he is in Afib. He uses oxygen at night. Review of records demonstrate he underwent DCCV on 3/7 and most likely went back into Afib on 3/12. He has been compliant with Eliquis and has not missed any doses. He has a scheduled bronchoscopy for RUL nodule on 4/8. He will stop taking Eliquis on 4/5 prior to procedure.   He drinks several beers on the weekend.   On follow up today 5/1, he is in Afib. 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 He is scheduled for stereotactic body radiotherapy (SBRT) for this finding on 5/20. He is more short of breath than usual when in Afib. He has not missed any doses of anticoagulation.  On follow up 5/14, patient is in Afib and here today for Tikosyn admission. He has not used benadryl in the past few days. He has not missed any doses of  anticoagulation. He is currently finishing prescription of keflex for cellulitis of lower extremity and only has 2 doses left after today's morning dose. He did transition to macrobid daily. He is currently on effexor and started it on 5/5; notes the current initial dose of 37.5 mg is not helping with anxiety. He is more short of breath as noted before while in Afib.    Today, he denies symptoms of palpitations, chest pain, orthopnea, PND, lower extremity edema, dizziness, presyncope, syncope, snoring, daytime somnolence, bleeding, or neurologic sequela. The patient is tolerating medications without difficulties and is otherwise without complaint today.    Atrial Fibrillation Risk Factors:  he does not have symptoms or diagnosis of sleep apnea. he does not have a history of rheumatic fever. he does not have a history of alcohol use. The patient does not have a history of early familial atrial fibrillation or other arrhythmias.  he has a BMI of Body mass index is 31.74 kg/m.Marland Kitchen Filed Weights   02/08/23 0819  Weight: 112.1 kg     Family History  Problem Relation Age of Onset   Hypertension Father      Atrial Fibrillation Management history:  Previous antiarrhythmic drugs: None Previous cardioversions: 2019, 12/02/22 Previous ablations: None Anticoagulation history: Eliquis 5 mg BID   Past Medical History:  Diagnosis Date   A-fib (HCC) 03/20/2018   Acute blood loss anemia    Anxiety    Benign prostatic hyperplasia with urinary retention    Cerebrovascular accident (CVA) (HCC)    CHF (congestive heart  failure) (HCC)    COPD (chronic obstructive pulmonary disease) (HCC)    Diabetes mellitus type 2 in nonobese (HCC)    Dyspnea    Dysrhythmia    A-Fib   Essential hypertension 03/20/2018   Former tobacco use    H/O ulcerative colitis 1993   Leukocytosis    Lung cancer (HCC)    Osteomyelitis (HCC)    a. 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   PAF (paroxysmal atrial  fibrillation) (HCC)    a. dx 03/2018 in setting of stroke, osteomyelitis.   Stage 3 chronic kidney disease (HCC)    pt/family unaware   Status post transmetatarsal amputation of left foot [facility])    Umbilical hernia    Past Surgical History:  Procedure Laterality Date   AMPUTATION Left 03/24/2018   Procedure: Transmetatarsal Amputation Left Foot;  Surgeon: Nadara Mustard, MD;  Location: [facility] OR;  Service: Orthopedics;  Laterality: Left;   BRONCHIAL BIOPSY  01/03/2023   Procedure: BRONCHIAL BIOPSIES;  Surgeon: Leslye Peer, MD;  Location: [facility] ENDOSCOPY;  Service: Pulmonary;;   BRONCHIAL BRUSHINGS  01/03/2023   Procedure: BRONCHIAL BRUSHINGS;  Surgeon: Leslye Peer, MD;  Location: [facility] ENDOSCOPY;  Service: Pulmonary;;   BRONCHIAL NEEDLE ASPIRATION BIOPSY  01/03/2023   Procedure: BRONCHIAL NEEDLE ASPIRATION BIOPSIES;  Surgeon: Leslye Peer, MD;  Location: [facility] ENDOSCOPY;  Service: Pulmonary;;   CARDIOVERSION N/A 05/05/2018   Procedure: CARDIOVERSION;  Surgeon: Chilton Si, MD;  Location: [facility] ENDOSCOPY;  Service: Cardiovascular;  Laterality: N/A;   CARDIOVERSION N/A 12/02/2022   Procedure: CARDIOVERSION;  Surgeon: Jodelle Red, MD;  Location: [facility] ENDOSCOPY;  Service: Cardiovascular;  Laterality: N/A;   COLONOSCOPY  12/2017   benign, remission from ulcerative colitis in 90s   FIDUCIAL MARKER PLACEMENT  01/03/2023   Procedure: FIDUCIAL MARKER PLACEMENT;  Surgeon: Leslye Peer, MD;  Location: [facility] ENDOSCOPY;  Service: Pulmonary;;   I & D EXTREMITY Left 03/17/2018   Procedure: IRRIGATION AND DEBRIDEMENT FOOT;  Surgeon: Yolonda Kida, MD;  Location: [facility] OR;  Service: Orthopedics;  Laterality: Left;   STUMP REVISION Left 05/10/2018   Procedure: REVISION LEFT TRANSMETATARSAL AMPUTATION;  Surgeon: Nadara Mustard, MD;  Location: [facility] OR;  Service: Orthopedics;  Laterality: Left;   VIDEO BRONCHOSCOPY WITH RADIAL ENDOBRONCHIAL ULTRASOUND  01/03/2023   Procedure: VIDEO BRONCHOSCOPY WITH RADIAL  ENDOBRONCHIAL ULTRASOUND;  Surgeon: Leslye Peer, MD;  Location: [facility] ENDOSCOPY;  Service: Pulmonary;;    Current Outpatient Medications  Medication Sig Dispense Refill   acetaminophen (TYLENOL) 500 MG tablet Take 1,000-1,500 mg by mouth at bedtime.     albuterol (VENTOLIN HFA) 108 (90 Base) MCG/ACT inhaler Inhale 2 puffs into the lungs every 6 (six) hours as needed for wheezing or shortness of breath. 24 g 1   apixaban (ELIQUIS) 5 MG TABS tablet Take 1 tablet (5 mg total) by mouth 2 (two) times daily. Okay to restart this medication with your evening dose on 01/04/2023 180 tablet 1   cephALEXin (KEFLEX) 250 MG capsule Take 1 capsule (250 mg total) by mouth every 6 (six) hours for 7 days. 250 mg orally every 6 hours (Patient taking differently: Take 250 mg by mouth every 6 (six) hours. 250 mg orally every 6 hours, Will finish tonight) 28 capsule 0   diltiazem (CARDIZEM CD) 120 MG 24 hr capsule Take 1 capsule (120 mg total) by mouth daily. Take at breakfast. 90 capsule 1   insulin aspart protamine - aspart (NOVOLOG 70/30 MIX) (70-30) 100 UNIT/ML FlexPen Inject 27 Units  into the skin 2 (two) times daily with a meal. 15 mL 11   Insulin Pen Needle (PEN NEEDLES 3/16") 31G X 5 MM MISC 1 Pen by Does not apply route 2 (two) times daily before a meal. Before breakfast and dinner 100 each 1   loratadine (CLARITIN) 10 MG tablet Take 10 mg by mouth at bedtime.     melatonin 3 MG TABS tablet Take 6 mg by mouth at bedtime.     mesalamine (LIALDA) 1.2 g EC tablet Take 1.2 g by mouth daily.     metoprolol succinate (TOPROL-XL) 50 MG 24 hr tablet Take 1 tablet (50 mg total) by mouth every evening.     Multiple Vitamins-Minerals (PRESERVISION AREDS 2 PO) Take 1 capsule by mouth 2 (two) times daily.     mupirocin cream (BACTROBAN) 2 % Apply 1 Application topically 2 (two) times daily. 15 g 0   mupirocin ointment (BACTROBAN) 2 % 1 application to affected area small amount Externally twice a day     nitrofurantoin,  macrocrystal-monohydrate, (MACROBID) 100 MG capsule Take 100 mg by mouth at bedtime.     rosuvastatin (CRESTOR) 10 MG tablet Take 0.5 tablets (5 mg total) by mouth daily. 45 tablet 1   senna-docusate (SENOKOT-S) 8.6-50 MG tablet Take 1 tablet by mouth at bedtime.     tamsulosin (FLOMAX) 0.4 MG CAPS capsule Take 1 capsule (0.4 mg total) by mouth 2 times daily at 12 noon and 4 pm. (Patient taking differently: Take 0.4 mg by mouth in the morning and at bedtime.) 60 capsule 0   Tiotropium Bromide-Olodaterol (STIOLTO RESPIMAT) 2.5-2.5 MCG/ACT AERS Inhale 2 puffs into the lungs daily. 4 g 11   venlafaxine XR (EFFEXOR-XR) 37.5 MG 24 hr capsule Take 37.5 mg by mouth daily.     No current facility-administered medications for this encounter.    Allergies  Allergen Reactions   Other     Per spouse, pt needs to avoid any abx that could potentially cause prolonged QT syndrome    Social History   Socioeconomic History   Marital status: Married    Spouse name: Not on file   Number of children: Not on file   Years of education: Not on file   Highest education level: Bachelor's degree (e.g., BA, AB, BS)  Occupational History   Not on file  Tobacco Use   Smoking status: Former    Packs/day: 1.00    Years: 20.00    Additional pack years: 0.00    Total pack years: 20.00    Types: Cigarettes    Start date: 1963    Quit date: 1985    Years since quitting: 39.3   Smokeless tobacco: Never   Tobacco comments:    Former smoker 40 yrs ago  Vaping Use   Vaping Use: Never used  Substance and Sexual Activity   Alcohol use: Yes    Alcohol/week: 6.0 standard drinks of alcohol    Types: 6 Cans of beer per week    Comment: 6 pack every weekend 01/26/23   Drug use: Never   Sexual activity: Not on file  Other Topics Concern   Not on file  Social History Narrative   Lives at home with his wife and two cats. Married for almost 45 years   Right handed   Caffeine: 1 cup every morning   Social  Determinants of Health   Financial Resource Strain: Not on file  Food Insecurity: No Food Insecurity (12/08/2022)   Hunger Vital Sign  Worried About Programme researcher, broadcasting/film/video in the Last Year: Never true    Ran Out of Food in the Last Year: Never true  Transportation Needs: No Transportation Needs (12/08/2022)   PRAPARE - Administrator, Civil Service (Medical): No    Lack of Transportation (Non-Medical): No  Physical Activity: Not on file  Stress: Not on file  Social Connections: Not on file  Intimate Partner Violence: Not At Risk (12/08/2022)   Humiliation, Afraid, Rape, and Kick questionnaire    Fear of Current or Ex-Partner: No    Emotionally Abused: No    Physically Abused: No    Sexually Abused: No     ROS- All systems are reviewed and negative except as per the HPI above.  Physical Exam: Vitals:   02/08/23 0819  BP: 116/76  Pulse: 76  Weight: 112.1 kg  Height: 6\' 2"  (1.88 m)    GEN- The patient is well appearing, alert and oriented x 3 today. Sitting in wheelchair Head- normocephalic, atraumatic Eyes-  Sclera clear, conjunctiva pink Ears- hearing intact Oropharynx- clear Neck- supple, no JVP Lymph- no cervical lymphadenopathy Lungs- Clear to ausculation bilaterally, normal work of breathing Heart- Irregular rate and rhythm, no murmurs, rubs or gallops, PMI not laterally displaced GI- soft, NT, ND, + BS. Umbilical hernia noted. Extremities- no clubbing, cyanosis, or edema MS- no significant deformity or atrophy Skin- no rash or lesion Psych- euthymic mood, full affect Neuro- strength and sensation are intact   Wt Readings from Last 3 Encounters:  02/08/23 112.1 kg  01/26/23 108.5 kg  01/20/23 108.5 kg    EKG today demonstrates  Vent. rate 76 BPM PR interval * ms QRS duration 158 ms QT/QTcB 436/490 ms P-R-T axes * -85 -26 Atrial fibrillation Right bundle branch block Left anterior fascicular block Bifascicular block  Abnormal ECG When  compared with ECG of 26-Jan-2023 13:32, PREVIOUS ECG IS PRESENT  Echo 12/08/22 demonstrated: 1. Left ventricular ejection fraction, by estimation, is 55 to 60%. The  left ventricle has normal function. The left ventricle has no regional  wall motion abnormalities. Left ventricular diastolic parameters are  indeterminate. There is the  interventricular septum is flattened in systole, suggestive of right  ventricular pressure overload.   2. Right ventricular systolic function is mildly reduced. The right  ventricular size is moderately enlarged. There is normal pulmonary artery  systolic pressure. The estimated right ventricular systolic pressure is  30.5 mmHg.   3. Left atrial size was mildly dilated.   4. Right atrial size was moderately dilated.   5. The mitral valve is grossly normal. Trivial mitral valve  regurgitation. No evidence of mitral stenosis.   6. The aortic valve is tricuspid. There is mild calcification of the  aortic valve. There is mild thickening of the aortic valve. Aortic valve  regurgitation is not visualized. No aortic stenosis is present.   7. The inferior vena cava is normal in size with greater than 50%  respiratory variability, suggesting right atrial pressure of 3 mmHg.    Epic records are reviewed at length today.  CHA2DS2-VASc Score = 8  The patient's score is based upon: CHF History: 1 HTN History: 1 Diabetes History: 1 Stroke History: 2 Vascular Disease History: 1 Age Score: 2 Gender Score: 0       ASSESSMENT AND PLAN: Persistent Atrial Fibrillation (ICD10:  I48.0) The patient's CHA2DS2-VASc score is 8, indicating a 10.8% annual risk of stroke.    He is in Afib today.  Patient would like to pursue dofetilide and presents for dofetilide admission. Continue Eliquis 5 mg BID, states no missed doses in the last 3 weeks. No recent benadryl use. PharmD has screened medications - he has transitioned to macrobid and effexor which do not interact  with Tikosyn.  QTc in SR 455 ms (ECG 12/02/22) Labs today show creatinine at 1.3, K+ 4.5 and mag 2.4, CrCl calculated at 73.  He is eligible for 500 mcg BID dose.  Discussion with radiation oncology PA regarding timing of radiation therapy in terms of starting Tikosyn. The plan is to begin radiation therapy on 5/20 and currently not thinking surgery or chemotherapy. Chemotherapy medications can possibly prolong the QT interval and thus would be contraindicated with Tikosyn which is why I doublechecked.  Continue diltiazem and metoprolol daily.  2. 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  Continue Apixaban (Eliquis).  No missed doses.  3. HTN Stable today; no changes.    Patient will go home and wait to be contacted by admissions when room is available.    Lake Bells, PA-C [facility] [address] [place], [place] [address] [phone] 02/08/2023 9:22 AM

## 2023-02-08 NOTE — Telephone Encounter (Signed)
Pharmacy Patient Advocate Encounter  Insurance verification completed.    The patient is insured through [employer] Medicare Part D   The patient is currently admitted and ran test claims for the following: dofetilide (Tikosyn) .  Copays and coinsurance results were relayed to Inpatient clinical team.  

## 2023-02-08 NOTE — Progress Notes (Signed)
Pharmacy: Dofetilide (Tikosyn) - Initial Consult Assessment and Electrolyte Replacement  Pharmacy consulted to assist in monitoring and replacing electrolytes in this 82 y.o. male admitted on 02/08/2023 undergoing dofetilide initiation. First dofetilide dose: 500 mcg 5/14 at 20:00  Assessment:  Patient Exclusion Criteria: If any screening criteria checked as "Yes", then  patient  should NOT receive dofetilide until criteria item is corrected.  If "Yes" please indicate correction plan.  YES  NO Patient  Exclusion Criteria Correction Plan   [x]   []   Baseline QTc interval is greater than or equal to 440 msec. IF above YES box checked dofetilide contraindicated unless patient has ICD; then may proceed if QTc 500-550 msec or with known ventricular conduction abnormalities may proceed with QTc 550-600 msec. QTc =    EP aware, ok to proceed   []   [x]   Patient is known or suspected to have a digoxin level greater than 2 ng/ml: No results found for: "DIGOXIN"     []   [x]   Creatinine clearance less than 20 ml/min (calculated using Cockcroft-Gault, actual body weight and serum creatinine): Estimated Creatinine Clearance: 61.4 mL/min (A) (by C-G formula based on SCr of 1.3 mg/dL (H)).     []   [x]  Patient has received drugs known to prolong the QT intervals within the last 48 hours (phenothiazines, tricyclics or tetracyclic antidepressants, erythromycin, H-1 antihistamines, cisapride, fluoroquinolones, azithromycin, ondansetron).   Updated information on QT prolonging agents is available to be searched on the following database:QT prolonging agents     []   [x]   Patient received a dose of hydrochlorothiazide (Oretic) alone or in any combination including triamterene (Dyazide, Maxzide) in the last 48 hours.    []   [x]  Patient received a medication known to increase dofetilide plasma concentrations prior to initial dofetilide dose:  Trimethoprim (Primsol, Proloprim) in the last 36  hours Verapamil (Calan, Verelan) in the last 36 hours or a sustained release dose in the last 72 hours Megestrol (Megace) in the last 5 days  Cimetidine (Tagamet) in the last 6 hours Ketoconazole (Nizoral) in the last 24 hours Itraconazole (Sporanox) in the last 48 hours  Prochlorperazine (Compazine) in the last 36 hours     []   [x]   Patient is known to have a history of torsades de pointes; congenital or acquired long QT syndromes.    []   [x]   Patient has received a Class 1 antiarrhythmic with less than 2 half-lives since last dose. (Disopyramide, Quinidine, Procainamide, Lidocaine, Mexiletine, Flecainide, Propafenone)    []   [x]   Patient has received amiodarone therapy in the past 3 months or amiodarone level is greater than 0.3 ng/ml.    Labs:    Component Value Date/Time   K 4.5 02/08/2023 1030   MG 2.4 02/08/2023 1030     Plan: Select One Calculated CrCl  Dose q12h  [x]  > 60 ml/min 500 mcg  []  40-60 ml/min 250 mcg  []  20-40 ml/min 125 mcg   [x]   Physician selected initial dose within range recommended for patients level of renal function - will monitor for response.  []   Physician selected initial dose outside of range recommended for patients level of renal function - will discuss if the dose should be altered at this time.   Patient has been appropriately anticoagulated with Eliquis 5mg  BID.  Potassium: K >/= 4: Appropriate to initiate Tikosyn, no replacement needed    Magnesium: Mg >2: Appropriate to initiate Tikosyn, no replacement needed     Thank you for allowing pharmacy to  participate in this patient's care   Loralee Pacas, PharmD, BCPS Please see amion for complete clinical pharmacist phone list 02/08/2023  3:17 PM

## 2023-02-09 ENCOUNTER — Inpatient Hospital Stay (HOSPITAL_COMMUNITY): Payer: Medicare Other | Admitting: Registered Nurse

## 2023-02-09 DIAGNOSIS — I4819 Other persistent atrial fibrillation: Secondary | ICD-10-CM | POA: Diagnosis not present

## 2023-02-09 LAB — BASIC METABOLIC PANEL
Anion gap: 12 (ref 5–15)
BUN: 21 mg/dL (ref 8–23)
CO2: 20 mmol/L — ABNORMAL LOW (ref 22–32)
Calcium: 8.5 mg/dL — ABNORMAL LOW (ref 8.9–10.3)
Chloride: 105 mmol/L (ref 98–111)
Creatinine, Ser: 1.32 mg/dL — ABNORMAL HIGH (ref 0.61–1.24)
GFR, Estimated: 55 mL/min — ABNORMAL LOW (ref >60)
Glucose, Bld: 73 mg/dL (ref 70–99)
Potassium: 4 mmol/L (ref 3.5–5.1)
Sodium: 137 mmol/L (ref 135–145)

## 2023-02-09 LAB — GLUCOSE, CAPILLARY: Glucose-Capillary: 70 mg/dL (ref 70–99)

## 2023-02-09 LAB — MAGNESIUM: Magnesium: 1.9 mg/dL (ref 1.7–2.4)

## 2023-02-09 MED ORDER — MAGNESIUM SULFATE 2 GM/50ML IV SOLN
2.0000 g | Freq: Once | INTRAVENOUS | Status: AC
  Administered 2023-02-09: 2 g via INTRAVENOUS
  Filled 2023-02-09: qty 50

## 2023-02-09 NOTE — Progress Notes (Signed)
Post dose EKG is reviewed SR 62bpm, RBBB, QTc stable OK to continue Tikosyn  Francis Dowse, PA-C

## 2023-02-09 NOTE — Progress Notes (Addendum)
Rounding Note    Patient Name: Kevin [name] Date of Encounter: 02/09/2023  [facility] Cardiologist: Verne Carrow, MD   Subjective   Denies CP, palpitations, reports breathing at his baseline, no unusual SOB  Inpatient Medications    Scheduled Meds:  apixaban  5 mg Oral BID   arformoterol  15 mcg Nebulization Q2000   And   umeclidinium bromide  1 puff Inhalation Daily   diltiazem  120 mg Oral Daily   dofetilide  500 mcg Oral BID   insulin aspart protamine- aspart  27 Units Subcutaneous BID WC   loratadine  10 mg Oral QHS   melatonin  6 mg Oral QHS   mesalamine  1.2 g Oral Daily   metoprolol succinate  50 mg Oral QPM   mupirocin cream  1 Application Topical BID   nitrofurantoin (macrocrystal-monohydrate)  100 mg Oral QHS   rosuvastatin  5 mg Oral Daily   sodium chloride flush  3 mL Intravenous Q12H   tamsulosin  0.4 mg Oral q12n4p   venlafaxine XR  37.5 mg Oral Daily   Continuous Infusions:  sodium chloride     PRN Meds: sodium chloride, acetaminophen, albuterol, senna-docusate, sodium chloride flush   Vital Signs    Vitals:   02/09/23 0003 02/09/23 0403 02/09/23 0720 02/09/23 0724  BP: 116/75 133/80    Pulse: 75 85    Resp: 18 17    Temp: 97.7 F (36.5 C) 97.6 F (36.4 C)    TempSrc: Oral Oral    SpO2: 95% 94% 95% 97%  Weight:      Height:        Intake/Output Summary (Last 24 hours) at 02/09/2023 0736 Last data filed at 02/09/2023 0710 Gross per 24 hour  Intake 240 ml  Output 1275 ml  Net -1035 ml      02/08/2023    4:31 PM 02/08/2023    8:19 AM 01/26/2023    1:29 PM  Last 3 Weights  Weight (lbs) 247 lb 3.2 oz 247 lb 3.2 oz 239 lb 3.2 oz  Weight (kg) 112.129 kg 112.129 kg 108.5 kg      Telemetry    AFib 80s, one NSVT (4 beats), rare PVCs, noted pre-drug as well. - Personally Reviewed  ECG    AFib 81bpm, RBBB, QTc 492 (not accounting for QRS 146) - Personally Reviewed with Dr. Elberta Fortis  Physical Exam   GEN: No  acute distress.   Neck: No JVD Cardiac: irreg-irreg, soft SM, no murmurs, rubs, or gallops.  Respiratory: some soft b/l exp wheezes, moving air. GI: Soft, nontender MS: L transmetatarsal amputations, no edema Neuro:  Nonfocal  Psych: Normal affect   Labs    High Sensitivity Troponin:  No results for input(s): "TROPONINIHS" in the last 720 hours.   Chemistry Recent Labs  Lab 02/08/23 1030 02/09/23 0302  NA 139 137  K 4.5 4.0  CL 107 105  CO2 23 20*  GLUCOSE 138* 73  BUN 22 21  CREATININE 1.30* 1.32*  CALCIUM 8.4* 8.5*  MG 2.4 1.9  GFRNONAA 56* 55*  ANIONGAP 9 12    Lipids No results for input(s): "CHOL", "TRIG", "HDL", "LABVLDL", "LDLCALC", "CHOLHDL" in the last 168 hours.  HematologyNo results for input(s): "WBC", "RBC", "HGB", "HCT", "MCV", "MCH", "MCHC", "RDW", "PLT" in the last 168 hours. Thyroid No results for input(s): "TSH", "FREET4" in the last 168 hours.  BNPNo results for input(s): "BNP", "PROBNP" in the last 168 hours.  DDimer No results  for input(s): "DDIMER" in the last 168 hours.   Radiology    No results found.  Cardiac Studies    12/08/22: TTE  1. Left ventricular ejection fraction, by estimation, is 55 to 60%. The  left ventricle has normal function. The left ventricle has no regional  wall motion abnormalities. Left ventricular diastolic parameters are  indeterminate. There is the  interventricular septum is flattened in systole, suggestive of right  ventricular pressure overload.   2. Right ventricular systolic function is mildly reduced. The right  ventricular size is moderately enlarged. There is normal pulmonary artery  systolic pressure. The estimated right ventricular systolic pressure is  30.5 mmHg.   3. Left atrial size was mildly dilated.   4. Right atrial size was moderately dilated.   5. The mitral valve is grossly normal. Trivial mitral valve  regurgitation. No evidence of mitral stenosis.   6. The aortic valve is tricuspid. There  is mild calcification of the  aortic valve. There is mild thickening of the aortic valve. Aortic valve  regurgitation is not visualized. No aortic stenosis is present.   7. The inferior vena cava is normal in size with greater than 50%  respiratory variability, suggesting right atrial pressure of 3 mmHg.   Patient Profile     82 y.o. male w/PMHx HTN, DM, chronic CHF (diastolic), CKD (IIIb), COPD, chronic hypoxic resp failure (w/HS O2), new dx of lung cancer (pending SBRT), HLD, stroke, ulcerative colitis, BPH, PVD (s/p L transmetatarsal amputation) and AFib, admitted for Tikosyn intitaion  Assessment & Plan    Persistent AFib CHA2DS2Vasc is 8, on Eliquis, appropriately dosed Tikosyn load is in progress K+ 4.0 Mag 1.9 Creat 1.32 (stable) QTc stable  DCCV tomorrow if not in SR, pt aware and agreeable   HTN Home meds  COPD, chronic hypoxic respiratory failure Home regime  Chronic CHF (diastolic) Appears compensated currently Home meds  DM Home meds/regime     For questions or updates, please contact [facility] Please consult [email or web address] for contact info under        Signed, Sheilah Pigeon, PA-C  02/09/2023, 7:36 AM    I have seen and examined this patient with Francis Dowse.  Agree with above, note added to reflect my findings.  Patient feeling well this morning.  Converted to sinus rhythm after first dose of dofetilide.  GEN: Well nourished, well developed, in no acute distress  HEENT: normal  Neck: no JVD, carotid bruits, or masses Cardiac: RRR; no murmurs, rubs, or gallops,no edema  Respiratory:  clear to auscultation bilaterally, normal work of breathing GI: soft, nontender, nondistended, + BS MS: no deformity or atrophy  Skin: warm and dry Neuro:  Strength and sensation are intact Psych: euthymic mood, full affect   Persistent atrial fibrillation: Currently on Eliquis.  Dofetilide load in progress.  Potassium magnesium within normal limits.   Converted to sinus rhythm after his first dose.  Ambree Frances continue to monitor QTc.  Kaushal Vannice M. Kadra Kohan MD 02/09/2023 11:42 AM

## 2023-02-09 NOTE — Progress Notes (Signed)
Pharmacy: Dofetilide (Tikosyn) - Follow Up Assessment and Electrolyte Replacement  Pharmacy consulted to assist in monitoring and replacing electrolytes in this 82 y.o. male admitted on 02/08/2023 undergoing dofetilide initiation.   Labs:    Component Value Date/Time   K 4.0 02/09/2023 0302   MG 1.9 02/09/2023 0302     Plan: Potassium: K >/= 4: No additional supplementation needed  Magnesium: Mg 1.8-2: Give Mg 2 gm IV x1     Thank you for allowing pharmacy to participate in this patient's care    Harland German, PharmD Clinical Pharmacist **Pharmacist phone directory can now be found on [email or web address] ([email or web address]).  Listed under [facility].

## 2023-02-10 ENCOUNTER — Inpatient Hospital Stay (HOSPITAL_COMMUNITY): Admission: RE | Admit: 2023-02-10 | Payer: Medicare Other | Source: Home / Self Care | Admitting: Cardiovascular Disease

## 2023-02-10 ENCOUNTER — Encounter (HOSPITAL_COMMUNITY)
Admission: RE | Disposition: A | Discharge status: Home / Self Care | Payer: Self-pay | Source: Ambulatory Visit | Attending: Internal Medicine

## 2023-02-10 DIAGNOSIS — I4819 Other persistent atrial fibrillation: Secondary | ICD-10-CM | POA: Diagnosis not present

## 2023-02-10 LAB — BASIC METABOLIC PANEL
Anion gap: 10 (ref 5–15)
Anion gap: 8 (ref 5–15)
BUN: 23 mg/dL (ref 8–23)
BUN: 24 mg/dL — ABNORMAL HIGH (ref 8–23)
CO2: 22 mmol/L (ref 22–32)
CO2: 22 mmol/L (ref 22–32)
Calcium: 8.4 mg/dL — ABNORMAL LOW (ref 8.9–10.3)
Calcium: 8.3 mg/dL — ABNORMAL LOW (ref 8.9–10.3)
Chloride: 104 mmol/L (ref 98–111)
Chloride: 104 mmol/L (ref 98–111)
Creatinine, Ser: 1.63 mg/dL — ABNORMAL HIGH (ref 0.61–1.24)
Creatinine, Ser: 1.48 mg/dL — ABNORMAL HIGH (ref 0.61–1.24)
GFR, Estimated: 43 mL/min — ABNORMAL LOW (ref >60)
GFR, Estimated: 48 mL/min — ABNORMAL LOW (ref >60)
Glucose, Bld: 122 mg/dL — ABNORMAL HIGH (ref 70–99)
Glucose, Bld: 181 mg/dL — ABNORMAL HIGH (ref 70–99)
Potassium: 4.4 mmol/L (ref 3.5–5.1)
Potassium: 4.5 mmol/L (ref 3.5–5.1)
Sodium: 136 mmol/L (ref 135–145)
Sodium: 134 mmol/L — ABNORMAL LOW (ref 135–145)

## 2023-02-10 LAB — CBC
HCT: 35.8 % — ABNORMAL LOW (ref 39.0–52.0)
Hemoglobin: 11.1 g/dL — ABNORMAL LOW (ref 13.0–17.0)
MCH: 28.8 pg (ref 26.0–34.0)
MCHC: 31 g/dL (ref 30.0–36.0)
MCV: 93 fL (ref 80.0–100.0)
Platelets: 207 10*3/uL (ref 150–400)
RBC: 3.85 MIL/uL — ABNORMAL LOW (ref 4.22–5.81)
RDW: 15.9 % — ABNORMAL HIGH (ref 11.5–15.5)
WBC: 8.2 10*3/uL (ref 4.0–10.5)
nRBC: 0 % (ref 0.0–0.2)

## 2023-02-10 LAB — GLUCOSE, CAPILLARY
Glucose-Capillary: 99 mg/dL (ref 70–99)
Glucose-Capillary: 156 mg/dL — ABNORMAL HIGH (ref 70–99)
Glucose-Capillary: 149 mg/dL — ABNORMAL HIGH (ref 70–99)
Glucose-Capillary: 124 mg/dL — ABNORMAL HIGH (ref 70–99)

## 2023-02-10 LAB — MAGNESIUM: Magnesium: 2.2 mg/dL (ref 1.7–2.4)

## 2023-02-10 SURGERY — CARDIOVERSION
Anesthesia: General

## 2023-02-10 MED ORDER — GUAIFENESIN ER 600 MG PO TB12
600.0000 mg | ORAL_TABLET | Freq: Two times a day (BID) | ORAL | Status: DC
  Administered 2023-02-10 – 2023-02-11 (×2): 600 mg via ORAL
  Filled 2023-02-10 (×2): qty 1

## 2023-02-10 MED ORDER — SODIUM CHLORIDE 0.9 % IV BOLUS
250.0000 mL | Freq: Once | INTRAVENOUS | Status: AC
  Administered 2023-02-10: 250 mL via INTRAVENOUS

## 2023-02-10 MED ORDER — DOFETILIDE 250 MCG PO CAPS
250.0000 ug | ORAL_CAPSULE | Freq: Two times a day (BID) | ORAL | Status: DC
  Administered 2023-02-10 – 2023-02-11 (×2): 250 ug via ORAL
  Filled 2023-02-10 (×2): qty 1

## 2023-02-10 NOTE — Progress Notes (Signed)
Patient called to advise his blood sugar was in the 60's according to his arm glucose monitor. Patient was given graham crackers and orange juice. Notified MD. Received order for ACHS blood sugar checks

## 2023-02-10 NOTE — Progress Notes (Addendum)
Rounding Note    Patient Name: Kevin [name] Date of Encounter: 02/10/2023  [facility] Cardiologist: Verne Carrow, MD   Subjective   Denies CP, palpitations, reports breathing at his baseline, no unusual SOB  Inpatient Medications    Scheduled Meds:  apixaban  5 mg Oral BID   arformoterol  15 mcg Nebulization Q2000   And   umeclidinium bromide  1 puff Inhalation Daily   diltiazem  120 mg Oral Daily   dofetilide  500 mcg Oral BID   insulin aspart protamine- aspart  27 Units Subcutaneous BID WC   loratadine  10 mg Oral QHS   melatonin  6 mg Oral QHS   mesalamine  1.2 g Oral Daily   metoprolol succinate  50 mg Oral QPM   mupirocin cream  1 Application Topical BID   nitrofurantoin (macrocrystal-monohydrate)  100 mg Oral QHS   rosuvastatin  5 mg Oral Daily   sodium chloride flush  3 mL Intravenous Q12H   tamsulosin  0.4 mg Oral q12n4p   venlafaxine XR  37.5 mg Oral Daily   Continuous Infusions:  sodium chloride     PRN Meds: sodium chloride, acetaminophen, albuterol, senna-docusate, sodium chloride flush   Vital Signs    Vitals:   02/09/23 2100 02/10/23 0623 02/10/23 0734 02/10/23 0736  BP: 137/77 130/72    Pulse: (!) 59 60    Resp: (!) 22 (!) 22    Temp: 98.3 F (36.8 C) 97.6 F (36.4 C)    TempSrc: Oral Oral    SpO2: 95% 96% 98% 96%  Weight:      Height:        Intake/Output Summary (Last 24 hours) at 02/10/2023 0751 Last data filed at 02/10/2023 0629 Gross per 24 hour  Intake 363 ml  Output 600 ml  Net -237 ml      02/08/2023    4:31 PM 02/08/2023    8:19 AM 01/26/2023    1:29 PM  Last 3 Weights  Weight (lbs) 247 lb 3.2 oz 247 lb 3.2 oz 239 lb 3.2 oz  Weight (kg) 112.129 kg 112.129 kg 108.5 kg      Telemetry    SR 60's, appears to have an Atach 90's, noted pre-drug as well. - Personally Reviewed  ECG    SR 66bpm, RBBB, 1st degree AVblock , LAD, QTc 486 (not accounting for QRS )  Physical Exam   Unchanged  exam GEN: No acute distress.   Neck: No JVD Cardiac: RRR, soft SM, no murmurs, rubs, or gallops.  Respiratory: some soft b/l exp wheezes, moving air well GI: Soft, nontender MS: L transmetatarsal amputations, no edema Neuro:  Nonfocal  Psych: Normal affect   Labs    High Sensitivity Troponin:  No results for input(s): "TROPONINIHS" in the last 720 hours.   Chemistry Recent Labs  Lab 02/08/23 1030 02/09/23 0302 02/10/23 0304  NA 139 137 136  K 4.5 4.0 4.4  CL 107 105 104  CO2 23 20* 22  GLUCOSE 138* 73 122*  BUN 22 21 23   CREATININE 1.30* 1.32* 1.63*  CALCIUM 8.4* 8.5* 8.4*  MG 2.4 1.9 2.2  GFRNONAA 56* 55* 43*  ANIONGAP 9 12 10     Lipids No results for input(s): "CHOL", "TRIG", "HDL", "LABVLDL", "LDLCALC", "CHOLHDL" in the last 168 hours.  Hematology Recent Labs  Lab 02/10/23 0304  WBC 8.2  RBC 3.85*  HGB 11.1*  HCT 35.8*  MCV 93.0  MCH 28.8  MCHC 31.0  RDW 15.9*  PLT 207   Thyroid No results for input(s): "TSH", "FREET4" in the last 168 hours.  BNPNo results for input(s): "BNP", "PROBNP" in the last 168 hours.  DDimer No results for input(s): "DDIMER" in the last 168 hours.   Radiology    No results found.  Cardiac Studies    12/08/22: TTE  1. Left ventricular ejection fraction, by estimation, is 55 to 60%. The  left ventricle has normal function. The left ventricle has no regional  wall motion abnormalities. Left ventricular diastolic parameters are  indeterminate. There is the  interventricular septum is flattened in systole, suggestive of right  ventricular pressure overload.   2. Right ventricular systolic function is mildly reduced. The right  ventricular size is moderately enlarged. There is normal pulmonary artery  systolic pressure. The estimated right ventricular systolic pressure is  30.5 mmHg.   3. Left atrial size was mildly dilated.   4. Right atrial size was moderately dilated.   5. The mitral valve is grossly normal. Trivial mitral  valve  regurgitation. No evidence of mitral stenosis.   6. The aortic valve is tricuspid. There is mild calcification of the  aortic valve. There is mild thickening of the aortic valve. Aortic valve  regurgitation is not visualized. No aortic stenosis is present.   7. The inferior vena cava is normal in size with greater than 50%  respiratory variability, suggesting right atrial pressure of 3 mmHg.   Patient Profile     82 y.o. male w/PMHx HTN, DM, chronic CHF (diastolic), CKD (IIIb), COPD, chronic hypoxic resp failure (w/HS O2), new dx of lung cancer (pending SBRT), HLD, stroke, ulcerative colitis, BPH, PVD (s/p L transmetatarsal amputation) and AFib, admitted for Tikosyn intitaion  Assessment & Plan    Persistent AFib CHA2DS2Vasc is 8, on Eliquis, appropriately dosed Tikosyn load is in progress K+ 4.4 Mag 2.2  Creat 1.63 (calcCrCl 58) up from numbers this admission He may require down-titration of drug He did have AKI back in march during a hospitalization w/hypoxic resp failure 2/2 COPD/CHF exacerbation CKD is known for him Will give him NS and encouraged to better hydrate here Will check BMET this afternoon   QTc stable    HTN Home meds  COPD, chronic hypoxic respiratory failure Home regime  Chronic CHF (diastolic) Appears compensated currently Home meds  DM Home meds/regime     For questions or updates, please contact [facility] Please consult [email or web address] for contact info under        Signed, Sheilah Pigeon, PA-C  02/10/2023, 7:51 AM

## 2023-02-10 NOTE — Progress Notes (Signed)
Post dose EKG is reviewed with Dr. Nelly Laurence  QTc is stable We discussed his renal function in review of historical labs, doubt he will maintain Calc CrCl 60 or better We have reduced his dose  to for this evening  Francis Dowse, PA-C

## 2023-02-10 NOTE — Progress Notes (Signed)
Patients AM CBG = 99 at 0752, Spoke to [employer] PA via secure chat, stated to hold AM dose of 70/30.

## 2023-02-10 NOTE — Care Management (Signed)
02-10-23 1445 Case Manager spoke with the patient regarding co pay cost. Patient is agreeable to cost and would like to have the initial Rx filled via [facility] and the Rx refills 90 day supply escribed to [facility].  No further needs identified at this time.

## 2023-02-10 NOTE — Progress Notes (Signed)
Pharmacy: Dofetilide (Tikosyn) - Follow Up Assessment and Electrolyte Replacement  Pharmacy consulted to assist in monitoring and replacing electrolytes in this 82 y.o. male admitted on 02/08/2023 undergoing dofetilide initiation.   Labs:    Component Value Date/Time   K 4.4 02/10/2023 0304   MG 2.2 02/10/2023 0304     Plan: Potassium: K >/= 4: No additional supplementation needed  Magnesium: Mg > 2: No additional supplementation needed  -He has not needed any potassium supplementation and would recommend no supplement at discharge  Thank you for allowing pharmacy to participate in this patient's care   Harland German, PharmD Clinical Pharmacist **Pharmacist phone directory can now be found on [email or web address] ([email or web address]).  Listed under [facility].

## 2023-02-11 ENCOUNTER — Encounter (HOSPITAL_COMMUNITY): Payer: Self-pay | Admitting: Cardiology

## 2023-02-11 ENCOUNTER — Other Ambulatory Visit (HOSPITAL_COMMUNITY): Payer: Self-pay

## 2023-02-11 ENCOUNTER — Ambulatory Visit: Payer: Medicare Other | Admitting: Physician Assistant

## 2023-02-11 DIAGNOSIS — C3412 Malignant neoplasm of upper lobe, left bronchus or lung: Secondary | ICD-10-CM | POA: Diagnosis not present

## 2023-02-11 DIAGNOSIS — Z87891 Personal history of nicotine dependence: Secondary | ICD-10-CM | POA: Diagnosis not present

## 2023-02-11 DIAGNOSIS — I4819 Other persistent atrial fibrillation: Secondary | ICD-10-CM | POA: Diagnosis not present

## 2023-02-11 DIAGNOSIS — C3411 Malignant neoplasm of upper lobe, right bronchus or lung: Secondary | ICD-10-CM | POA: Diagnosis not present

## 2023-02-11 LAB — BASIC METABOLIC PANEL
Anion gap: 8 (ref 5–15)
BUN: 24 mg/dL — ABNORMAL HIGH (ref 8–23)
CO2: 22 mmol/L (ref 22–32)
Calcium: 8.3 mg/dL — ABNORMAL LOW (ref 8.9–10.3)
Chloride: 105 mmol/L (ref 98–111)
Creatinine, Ser: 1.39 mg/dL — ABNORMAL HIGH (ref 0.61–1.24)
GFR, Estimated: 52 mL/min — ABNORMAL LOW (ref >60)
Glucose, Bld: 92 mg/dL (ref 70–99)
Potassium: 4.1 mmol/L (ref 3.5–5.1)
Sodium: 135 mmol/L (ref 135–145)

## 2023-02-11 LAB — GLUCOSE, CAPILLARY
Glucose-Capillary: 109 mg/dL — ABNORMAL HIGH (ref 70–99)
Glucose-Capillary: 126 mg/dL — ABNORMAL HIGH (ref 70–99)

## 2023-02-11 LAB — MAGNESIUM: Magnesium: 2 mg/dL (ref 1.7–2.4)

## 2023-02-11 MED ORDER — DOFETILIDE 250 MCG PO CAPS
250.0000 ug | ORAL_CAPSULE | Freq: Two times a day (BID) | ORAL | 5 refills | Status: DC
  Filled 2023-02-11: qty 60, 30d supply, fill #0

## 2023-02-11 MED ORDER — MAGNESIUM SULFATE 2 GM/50ML IV SOLN
2.0000 g | Freq: Once | INTRAVENOUS | Status: AC
  Administered 2023-02-11: 2 g via INTRAVENOUS
  Filled 2023-02-11: qty 50

## 2023-02-11 NOTE — Plan of Care (Signed)
  Problem: Education: Goal: Knowledge of General Education information will improve Description: Including pain rating scale, medication(s)/side effects and non-pharmacologic comfort measures 02/11/2023 1211 by Herma Carson, RN Outcome: Adequate for Discharge 02/11/2023 0843 by Herma Carson, RN Outcome: Progressing   Problem: Health Behavior/Discharge Planning: Goal: Ability to manage health-related needs will improve 02/11/2023 1211 by Herma Carson, RN Outcome: Adequate for Discharge 02/11/2023 0843 by Herma Carson, RN Outcome: Progressing   Problem: Clinical Measurements: Goal: Ability to maintain clinical measurements within normal limits will improve 02/11/2023 1211 by Herma Carson, RN Outcome: Adequate for Discharge 02/11/2023 0843 by Herma Carson, RN Outcome: Progressing Goal: Will remain free from infection 02/11/2023 1211 by Herma Carson, RN Outcome: Adequate for Discharge 02/11/2023 0843 by Herma Carson, RN Outcome: Progressing Goal: Diagnostic test results will improve 02/11/2023 1211 by Herma Carson, RN Outcome: Adequate for Discharge 02/11/2023 0843 by Herma Carson, RN Outcome: Progressing Goal: Respiratory complications will improve 02/11/2023 1211 by Herma Carson, RN Outcome: Adequate for Discharge 02/11/2023 0843 by Herma Carson, RN Outcome: Progressing Goal: Cardiovascular complication will be avoided 02/11/2023 1211 by Herma Carson, RN Outcome: Adequate for Discharge 02/11/2023 0843 by Herma Carson, RN Outcome: Progressing   Problem: Activity: Goal: Risk for activity intolerance will decrease 02/11/2023 1211 by Herma Carson, RN Outcome: Adequate for Discharge 02/11/2023 0843 by Herma Carson, RN Outcome: Progressing   Problem: Nutrition: Goal: Adequate nutrition will be maintained 02/11/2023 1211 by Herma Carson, RN Outcome: Adequate for Discharge 02/11/2023 0843 by Herma Carson, RN Outcome: Progressing    Problem: Coping: Goal: Level of anxiety will decrease 02/11/2023 1211 by Herma Carson, RN Outcome: Adequate for Discharge 02/11/2023 0843 by Herma Carson, RN Outcome: Progressing   Problem: Elimination: Goal: Will not experience complications related to bowel motility 02/11/2023 1211 by Herma Carson, RN Outcome: Adequate for Discharge 02/11/2023 0843 by Herma Carson, RN Outcome: Progressing Goal: Will not experience complications related to urinary retention 02/11/2023 1211 by Herma Carson, RN Outcome: Adequate for Discharge 02/11/2023 0843 by Herma Carson, RN Outcome: Progressing   Problem: Pain Managment: Goal: General experience of comfort will improve 02/11/2023 1211 by Herma Carson, RN Outcome: Adequate for Discharge 02/11/2023 0843 by Herma Carson, RN Outcome: Progressing   Problem: Safety: Goal: Ability to remain free from injury will improve 02/11/2023 1211 by Herma Carson, RN Outcome: Adequate for Discharge 02/11/2023 0843 by Herma Carson, RN Outcome: Progressing   Problem: Skin Integrity: Goal: Risk for impaired skin integrity will decrease 02/11/2023 1211 by Herma Carson, RN Outcome: Adequate for Discharge 02/11/2023 0843 by Herma Carson, RN Outcome: Progressing

## 2023-02-11 NOTE — Progress Notes (Signed)
Pharmacy: Dofetilide (Tikosyn) - Follow Up Assessment and Electrolyte Replacement  Pharmacy consulted to assist in monitoring and replacing electrolytes in this 82 y.o. male admitted on 02/08/2023 undergoing dofetilide initiation.   Labs:    Component Value Date/Time   K 4.1 02/11/2023 0328   MG 2.0 02/11/2023 0328     Plan: Potassium: K >/= 4: No additional supplementation needed  Magnesium: Mg 1.8-2: Give Mg 2 gm IV x1   He has not needed any potassium supplementation this admission and should not need a supplement at discharge  Thank you for allowing pharmacy to participate in this patient's care   Harland German, PharmD Clinical Pharmacist **Pharmacist phone directory can now be found on [email or web address] ([email or web address]).  Listed under [facility].

## 2023-02-11 NOTE — Discharge Summary (Signed)
ELECTROPHYSIOLOGY PROCEDURE DISCHARGE SUMMARY    Patient ID: Kevin [name],  MRN: [id number], DOB/AGE: 1941-03-25 82 y.o.  Admit date: 02/08/2023 Discharge date: 02/11/2023  Primary Care Physician: Eustace Pen, PA-C  Primary Cardiologist: Dr. Clifton James Electrophysiologist: new, Dr. Elberta Fortis  Primary Discharge Diagnosis:  1.  persistent atrial fibrillation status post Tikosyn loading this admission      CHA2DS2Vasc is 8, on Eliquis  Secondary Discharge Diagnosis:  COPD (O2 dependent) Lung cancer Pending start of SBRT HTN DM Chronic CHF (diastolic) Currently compensated CKD (IIIb) Stroke history PVD anxiety  Allergies  Allergen Reactions   Other     Per spouse, pt needs to avoid any abx that could potentially cause prolonged QT syndrome     Procedures This Admission:  1.  Tikosyn loading  Brief HPI: [name] Standard is a 82 y.o. male with a past medical history as noted above.  They were referred to the AFib clinic in the outpatient setting for treatment options of atrial fibrillation.  Risks, benefits, and alternatives to Tikosyn were reviewed with the patient who wished to proceed.    Hospital Course:  The patient was admitted and Tikosyn was initiated.  Renal function and electrolytes were followed during the hospitalization.  His Renal function did dip some to a CrCl <60 while here and in review of his historical labs, felt dose would allow better safety with Tikosyn.  The patient's QTc remained stable.  He converted with drug and did not require DCCV.  He was monitored until discharge on telemetry which demonstrated AFib > SR.  During his stay both the patient and his wife raised concerns about his anxiety and management of it.  Discussed non-medical management strategies, ongoing discussion with his PMD, and offered palliative consult out patient to help guide this management as well.   The patient very open to their consult.   On the day of  discharge, he feels well, was examined by Dr Nelly Laurence who considered the patient stable for discharge to home.  Follow-up has been arranged with the AFib clinic in 1 week and with Dr Nelly Laurence in 4 weeks.   Tikosyn teaching was completed No new or additional electrolyte replacement for home   Physical Exam: Vitals:   02/11/23 0500 02/11/23 0845 02/11/23 0848 02/11/23 1102  BP: 131/67   122/68  Pulse: 65     Resp: 18   18  Temp: 98.5 F (36.9 C)   98.6 F (37 C)  TempSrc: Oral   Oral  SpO2: 91% 92% 95%   Weight:      Height:         GEN- The patient is well appearing, alert and oriented x 3 today.   HEENT: normocephalic, atraumatic; sclera clear, conjunctiva pink; hearing intact; oropharynx clear; neck supple, no JVP Lymph- no cervical lymphadenopathy Lungs- CTA b/l, normal work of breathing. B/l exp wheezes, moving air well though, no rales, rhonchi Heart- RRR, no murmurs, rubs or gallops, PMI not laterally displaced GI- soft, non-tender, non-distended Extremities- no clubbing, cyanosis, or edema MS- no significant deformity or atrophy Skin- warm and dry, no rash or lesion Psych- euthymic mood, full affect Neuro- strength and sensation are intact   Labs:   Lab Results  Component Value Date   WBC 8.2 02/10/2023   HGB 11.1 (L) 02/10/2023   HCT 35.8 (L) 02/10/2023   MCV 93.0 02/10/2023   PLT 207 02/10/2023    Recent Labs  Lab 02/11/23 0328  NA 135  K 4.1  CL 105  CO2 22  BUN 24*  CREATININE 1.39*  CALCIUM 8.3*  GLUCOSE 92     Discharge Medications:  Allergies as of 02/11/2023       Reactions   Other    Per spouse, pt needs to avoid any abx that could potentially cause prolonged QT syndrome        Medication List     STOP taking these medications    cephALEXin 250 MG capsule Commonly known as: KEFLEX       TAKE these medications    acetaminophen 500 MG tablet Commonly known as: TYLENOL Take 1,000-1,500 mg by mouth at bedtime as needed  (pain.).   albuterol 108 (90 Base) MCG/ACT inhaler Commonly known as: VENTOLIN HFA Inhale 2 puffs into the lungs every 6 (six) hours as needed for wheezing or shortness of breath.   apixaban 5 MG Tabs tablet Commonly known as: Eliquis Take 1 tablet (5 mg total) by mouth 2 (two) times daily. Okay to restart this medication with your evening dose on 01/04/2023   diltiazem 120 MG 24 hr capsule Commonly known as: CARDIZEM CD Take 1 capsule (120 mg total) by mouth daily. Take at breakfast.   dofetilide 250 MCG capsule Commonly known as: TIKOSYN Take 1 capsule (250 mcg total) by mouth 2 (two) times daily.   insulin aspart protamine - aspart (70-30) 100 UNIT/ML FlexPen Commonly known as: NOVOLOG 70/30 MIX Inject 27 Units into the skin 2 (two) times daily with a meal.   loratadine 10 MG tablet Commonly known as: CLARITIN Take 10 mg by mouth at bedtime.   melatonin 3 MG Tabs tablet Take 6 mg by mouth at bedtime.   mesalamine 1.2 g EC tablet Commonly known as: LIALDA Take 1.2 g by mouth in the morning.   metoprolol succinate 50 MG 24 hr tablet Commonly known as: TOPROL-XL Take 1 tablet (50 mg total) by mouth every evening.   mupirocin cream 2 % Commonly known as: BACTROBAN Apply 1 Application topically 2 (two) times daily.   nitrofurantoin (macrocrystal-monohydrate) 100 MG capsule Commonly known as: MACROBID Take 100 mg by mouth at bedtime.   Pen Needles 3/16" 31G X 5 MM Misc 1 Pen by Does not apply route 2 (two) times daily before a meal. Before breakfast and dinner   PRESERVISION AREDS 2 PO Take 1 capsule by mouth 2 (two) times daily.   rosuvastatin 10 MG tablet Commonly known as: CRESTOR Take 0.5 tablets (5 mg total) by mouth daily.   senna-docusate 8.6-50 MG tablet Commonly known as: Senokot-S Take 1 tablet by mouth at bedtime. What changed:  when to take this reasons to take this   Stiolto Respimat 2.5-2.5 MCG/ACT Aers Generic drug: Tiotropium  Bromide-Olodaterol Inhale 2 puffs into the lungs daily.   tamsulosin 0.4 MG Caps capsule Commonly known as: FLOMAX Take 1 capsule (0.4 mg total) by mouth 2 times daily at 12 noon and 4 pm. What changed: when to take this   venlafaxine XR 37.5 MG 24 hr capsule Commonly known as: EFFEXOR-XR Take 37.5 mg by mouth in the morning.        Disposition: Home Discharge Instructions     Amb Referral to Palliative Care   Complete by: As directed    New cancer diagnosis, high symptom burden, anxiety, goals of care   Diet - low sodium heart healthy   Complete by: As directed    Increase activity slowly   Complete by: As  directed    No wound care   Complete by: As directed         Duration of Discharge Encounter: Greater than 30 minutes including physician time.  Norma Fredrickson, PA-C 02/11/2023 12:04 PM

## 2023-02-11 NOTE — Care Management Important Message (Signed)
Important Message  Patient Details  Name: Kevin [name] MRN: [id number] Date of Birth: 04-05-1941   Medicare Important Message Given:  Yes     Renie Ora 02/11/2023, 8:36 AM

## 2023-02-11 NOTE — Plan of Care (Signed)

## 2023-02-16 ENCOUNTER — Other Ambulatory Visit: Payer: Self-pay

## 2023-02-16 ENCOUNTER — Ambulatory Visit
Admission: RE | Admit: 2023-02-16 | Discharge: 2023-02-16 | Disposition: A | Discharge status: Home / Self Care | Payer: Medicare Other | Source: Ambulatory Visit | Attending: Radiation Oncology | Admitting: Radiation Oncology

## 2023-02-16 DIAGNOSIS — Z510 Encounter for antineoplastic radiation therapy: Secondary | ICD-10-CM | POA: Diagnosis not present

## 2023-02-16 DIAGNOSIS — C3411 Malignant neoplasm of upper lobe, right bronchus or lung: Secondary | ICD-10-CM | POA: Diagnosis not present

## 2023-02-16 DIAGNOSIS — C3412 Malignant neoplasm of upper lobe, left bronchus or lung: Secondary | ICD-10-CM | POA: Diagnosis not present

## 2023-02-16 LAB — RAD ONC ARIA SESSION SUMMARY
Course Elapsed Days: 0
Plan Fractions Treated to Date: 1
Plan Fractions Treated to Date: 1
Plan Prescribed Dose Per Fraction: 12 Gy
Plan Prescribed Dose Per Fraction: 12 Gy
Plan Total Fractions Prescribed: 5
Plan Total Fractions Prescribed: 5
Plan Total Prescribed Dose: 60 Gy
Plan Total Prescribed Dose: 60 Gy
Reference Point Dosage Given to Date: 12 Gy
Reference Point Dosage Given to Date: 12 Gy
Reference Point Session Dosage Given: 12 Gy
Reference Point Session Dosage Given: 12 Gy
Session Number: 1

## 2023-02-18 ENCOUNTER — Other Ambulatory Visit: Payer: Self-pay

## 2023-02-18 ENCOUNTER — Ambulatory Visit (HOSPITAL_COMMUNITY): Payer: Medicare Other | Admitting: Physician Assistant

## 2023-02-18 ENCOUNTER — Ambulatory Visit
Admission: RE | Admit: 2023-02-18 | Discharge: 2023-02-18 | Disposition: A | Discharge status: Home / Self Care | Payer: Medicare Other | Source: Ambulatory Visit | Attending: Radiation Oncology | Admitting: Radiation Oncology

## 2023-02-18 ENCOUNTER — Ambulatory Visit (INDEPENDENT_AMBULATORY_CARE_PROVIDER_SITE_OTHER): Payer: Medicare Other | Admitting: Student

## 2023-02-18 ENCOUNTER — Encounter: Payer: Self-pay | Admitting: Student

## 2023-02-18 VITALS — BP 123/66 | HR 99 | Ht 74.0 in | Wt 243.2 lb

## 2023-02-18 DIAGNOSIS — C3411 Malignant neoplasm of upper lobe, right bronchus or lung: Secondary | ICD-10-CM | POA: Diagnosis not present

## 2023-02-18 DIAGNOSIS — Z794 Long term (current) use of insulin: Secondary | ICD-10-CM | POA: Diagnosis not present

## 2023-02-18 DIAGNOSIS — Z7189 Other specified counseling: Secondary | ICD-10-CM | POA: Insufficient documentation

## 2023-02-18 DIAGNOSIS — E119 Type 2 diabetes mellitus without complications: Secondary | ICD-10-CM | POA: Diagnosis not present

## 2023-02-18 DIAGNOSIS — Z7689 Persons encountering health services in other specified circumstances: Secondary | ICD-10-CM | POA: Diagnosis not present

## 2023-02-18 DIAGNOSIS — C3412 Malignant neoplasm of upper lobe, left bronchus or lung: Secondary | ICD-10-CM | POA: Diagnosis not present

## 2023-02-18 DIAGNOSIS — E1151 Type 2 diabetes mellitus with diabetic peripheral angiopathy without gangrene: Secondary | ICD-10-CM

## 2023-02-18 DIAGNOSIS — F419 Anxiety disorder, unspecified: Secondary | ICD-10-CM | POA: Insufficient documentation

## 2023-02-18 DIAGNOSIS — Z510 Encounter for antineoplastic radiation therapy: Secondary | ICD-10-CM | POA: Diagnosis not present

## 2023-02-18 LAB — RAD ONC ARIA SESSION SUMMARY
Course Elapsed Days: 2
Plan Fractions Treated to Date: 2
Plan Fractions Treated to Date: 2
Plan Prescribed Dose Per Fraction: 12 Gy
Plan Prescribed Dose Per Fraction: 12 Gy
Plan Total Fractions Prescribed: 5
Plan Total Fractions Prescribed: 5
Plan Total Prescribed Dose: 60 Gy
Plan Total Prescribed Dose: 60 Gy
Reference Point Dosage Given to Date: 24 Gy
Reference Point Dosage Given to Date: 24 Gy
Reference Point Session Dosage Given: 12 Gy
Reference Point Session Dosage Given: 12 Gy
Session Number: 2

## 2023-02-18 LAB — POCT GLYCOSYLATED HEMOGLOBIN (HGB A1C): HbA1c, POC (controlled diabetic range): 7.3 % — AB (ref 0.0–7.0)

## 2023-02-18 MED ORDER — VENLAFAXINE HCL ER 75 MG PO CP24
75.0000 mg | ORAL_CAPSULE | Freq: Every morning | ORAL | 1 refills | Status: DC
Start: 2023-02-18 — End: 2023-02-28

## 2023-02-18 NOTE — Patient Instructions (Addendum)
You've been on a VERY small dose of Effexor. I do believe this is a good choice for you fro your anxiety---especially since we're limited by the tikosyn. Let's give it a real try and increase to 75mg  daily. We will check back in in 4 weeks.  It might be worth trying to find a therapist on [email or web address]/ where you can search by zip code and insurance.  [name], MD

## 2023-02-18 NOTE — Assessment & Plan Note (Addendum)
Atypical treatment regimen of 70-30 insulin monotherapy.  However, A1c has improved from 10.2% to 7.3%.  Therefore will not change therapy at this time. His wife is a retired Charity fundraiser and monitors his sugars.  As he has active cancer, he may actually derive some benefit from the anabolic effect of the insulin.  He is on a statin, Crestor 5 mg daily.  Last lipid panel within normal limits.

## 2023-02-18 NOTE — Progress Notes (Signed)
Charge RN alerted to patient experiencing respiratory distress in the lobby. Patient placed on oxygen and assessed in clinic. Patient stated that he became very anxious while trying to use the restroom and was not utilizing his typical home oxygen when he entered the clinic.  Patient's vitals stable on 3 L [place]. Patient transferred via wheelchair on oxygen to radiation. Patient assisted to restroom and to changing room. Dr. Felicita Gage RN, Glee Arvin and radiation techs aware of the situation.  Patient stable at time of radiation treatment. Patient advised to ask for extra oxygen cannister to switch to when he enters Cancer Center. Staff can accommodate and bring one as needed. Patient and wife verbalized an understanding of the information and expressed gratitude.

## 2023-02-18 NOTE — Assessment & Plan Note (Signed)
Very pleasant 82 year old gentleman here with his wife.  Has had a difficult past 4 years with multiple new serious medical diagnoses.  Reviewed and updated his medication list in the EMR.

## 2023-02-18 NOTE — Progress Notes (Signed)
SUBJECTIVE:   CHIEF COMPLAINT / HPI:   New Patient  Patient presents today to establish with me as his PCP.  Per chart review, has a history of non-small cell Lung CA followed by Dr. Delton Coombes and recently started rad onc treatment,  Afib on tikosyn, COPD on home O2, Chronic diastolic, CKD IIIb, Stroke, PVD, Anxiety, chronic Left LE wound, ulcerative colitis   Medications include albuterol, Eliquis, diltiazem, dofetilide, 70-30 insulin 27-30 units daily, loratadine, melatonin, mesalamine, metoprolol succinate 50mg  daily, AREDS vitamins, Crestor 5mg  daily, senna, stiolto, flomax BID, Effexor 37.5 mg daily   Sees specialists for EP-A Fib, Pulm for Lung CA, Rad-Onc for SBRT,   Family: Lives at home with his wife, no children Hobbies/Activities: Unfortunately, much of his activity has been limited recently due to his multiple illnesses in the past 40 years or so and crippling anxiety.  Prior to the onset of his illnesses in 2019, he was very active and engaged in his community.  He very much enjoys getting out and cutting his grass, this is a special activity for him. Work: Retired after a career in [employer].  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 Tobacco:   Quit 40 years ago etOH/drugs: 2 beers per day, no drugs  Anxiety Voices the desire to discuss his anxiety today.  History of very difficult to control anxiety.  Was on Prozac for a long time, at some point get transition to Lexapro.  This is no longer an option due to him being on Tikosyn in the drug interaction between Tikosyn and SSRIs.  Was recommended to trial either Effexor or Cymbalta.  Has been on Effexor 37.5 mg daily with no effect.  He is discouraged by this as he has a hard time getting up to do much of anything due to his anxiety.  He is not currently in therapy, did do some therapy "many years ago" but none recently.  Open to pursuing this  though.  OBJECTIVE:   BP 123/66   Pulse 99   Ht 6\' 2"  (1.88 m)   Wt 243 lb 3.2 oz (110.3 kg)   SpO2 93%   BMI 31.23 kg/m   Physical Exam Constitutional:      Comments: Chronically ill appearing but in good spirits, using clinic wheelchair  Cardiovascular:     Rate and Rhythm: Normal rate and regular rhythm.  Pulmonary:     Comments: 2L [place], breathing comfortably. Expiratory wheeze throughout Musculoskeletal:     Right lower leg: No edema.     Left lower leg: No edema.  Skin:    General: Skin is warm and dry.  Psychiatric:     Comments: Not anxious today      ASSESSMENT/PLAN:   Encounter to establish care with new doctor Very pleasant 82 year old gentleman here with his wife.  Has had a difficult past 4 years with multiple new serious medical diagnoses.  Reviewed and updated his medication list in the EMR.  Diabetes (HCC) Atypical treatment regimen of 70-30 insulin monotherapy.  However, A1c has improved from 10.2% to 7.3%.  Therefore will not change therapy at this time. His wife is a retired Charity fundraiser and monitors his sugars.  As he has active cancer, he may actually derive some benefit from the anabolic effect of the insulin.  He is on a statin, Crestor 5 mg daily.  Last lipid panel within normal limits.  Anxiety Significant  life disruption from symptoms which prevent him from getting out of the house nearly as much as he would like to.  Lack of response to Effexor is not surprising given he has been on such a low-dose.  Will plan to uptitrate this dose to effect.  Unfortunately, we are quite limited what we can use for him given his concomitant atrial fibrillation on Tikosyn. -Increase Effexor to 75 mg daily -Patient to reach out to a therapist through [email or web address] -Follow-up in 4 weeks      J Dorothyann Gibbs, MD [facility]

## 2023-02-18 NOTE — Assessment & Plan Note (Signed)
Significant life disruption from symptoms which prevent him from getting out of the house nearly as much as he would like to.  Lack of response to Effexor is not surprising given he has been on such a low-dose.  Will plan to uptitrate this dose to effect.  Unfortunately, we are quite limited what we can use for him given his concomitant atrial fibrillation on Tikosyn. -Increase Effexor to 75 mg daily -Patient to reach out to a therapist through [email or web address] -Follow-up in 4 weeks

## 2023-02-22 ENCOUNTER — Ambulatory Visit
Admission: RE | Admit: 2023-02-22 | Discharge: 2023-02-22 | Disposition: A | Discharge status: Home / Self Care | Payer: Medicare Other | Source: Ambulatory Visit | Attending: Radiation Oncology | Admitting: Radiation Oncology

## 2023-02-22 ENCOUNTER — Other Ambulatory Visit: Payer: Self-pay

## 2023-02-22 DIAGNOSIS — Z510 Encounter for antineoplastic radiation therapy: Secondary | ICD-10-CM | POA: Diagnosis not present

## 2023-02-22 DIAGNOSIS — C3411 Malignant neoplasm of upper lobe, right bronchus or lung: Secondary | ICD-10-CM | POA: Diagnosis not present

## 2023-02-22 DIAGNOSIS — C3412 Malignant neoplasm of upper lobe, left bronchus or lung: Secondary | ICD-10-CM | POA: Diagnosis not present

## 2023-02-22 LAB — RAD ONC ARIA SESSION SUMMARY
Course Elapsed Days: 6
Plan Fractions Treated to Date: 3
Plan Fractions Treated to Date: 3
Plan Prescribed Dose Per Fraction: 12 Gy
Plan Prescribed Dose Per Fraction: 12 Gy
Plan Total Fractions Prescribed: 5
Plan Total Fractions Prescribed: 5
Plan Total Prescribed Dose: 60 Gy
Plan Total Prescribed Dose: 60 Gy
Reference Point Dosage Given to Date: 36 Gy
Reference Point Dosage Given to Date: 36 Gy
Reference Point Session Dosage Given: 12 Gy
Reference Point Session Dosage Given: 12 Gy
Session Number: 3

## 2023-02-23 ENCOUNTER — Ambulatory Visit (HOSPITAL_COMMUNITY)
Admission: RE | Admit: 2023-02-23 | Discharge: 2023-02-23 | Disposition: A | Discharge status: Home / Self Care | Payer: Medicare Other | Source: Ambulatory Visit | Attending: Physician Assistant | Admitting: Physician Assistant

## 2023-02-23 VITALS — BP 132/76 | HR 98 | Ht 74.0 in | Wt 239.2 lb

## 2023-02-23 DIAGNOSIS — R0602 Shortness of breath: Secondary | ICD-10-CM | POA: Insufficient documentation

## 2023-02-23 DIAGNOSIS — Z5181 Encounter for therapeutic drug level monitoring: Secondary | ICD-10-CM

## 2023-02-23 DIAGNOSIS — Z8673 Personal history of transient ischemic attack (TIA), and cerebral infarction without residual deficits: Secondary | ICD-10-CM | POA: Insufficient documentation

## 2023-02-23 DIAGNOSIS — Z79899 Other long term (current) drug therapy: Secondary | ICD-10-CM | POA: Diagnosis not present

## 2023-02-23 DIAGNOSIS — D6869 Other thrombophilia: Secondary | ICD-10-CM | POA: Insufficient documentation

## 2023-02-23 DIAGNOSIS — J449 Chronic obstructive pulmonary disease, unspecified: Secondary | ICD-10-CM | POA: Diagnosis not present

## 2023-02-23 DIAGNOSIS — E1122 Type 2 diabetes mellitus with diabetic chronic kidney disease: Secondary | ICD-10-CM | POA: Insufficient documentation

## 2023-02-23 DIAGNOSIS — Z7901 Long term (current) use of anticoagulants: Secondary | ICD-10-CM | POA: Diagnosis not present

## 2023-02-23 DIAGNOSIS — E785 Hyperlipidemia, unspecified: Secondary | ICD-10-CM | POA: Diagnosis not present

## 2023-02-23 DIAGNOSIS — I4819 Other persistent atrial fibrillation: Secondary | ICD-10-CM | POA: Insufficient documentation

## 2023-02-23 DIAGNOSIS — N1832 Chronic kidney disease, stage 3b: Secondary | ICD-10-CM | POA: Diagnosis not present

## 2023-02-23 DIAGNOSIS — I110 Hypertensive heart disease with heart failure: Secondary | ICD-10-CM | POA: Diagnosis not present

## 2023-02-23 LAB — CBC
HCT: 38.5 % — ABNORMAL LOW (ref 39.0–52.0)
Hemoglobin: 11.6 g/dL — ABNORMAL LOW (ref 13.0–17.0)
MCH: 29.1 pg (ref 26.0–34.0)
MCHC: 30.1 g/dL (ref 30.0–36.0)
MCV: 96.7 fL (ref 80.0–100.0)
Platelets: 250 10*3/uL (ref 150–400)
RBC: 3.98 MIL/uL — ABNORMAL LOW (ref 4.22–5.81)
RDW: 15.3 % (ref 11.5–15.5)
WBC: 8.6 10*3/uL (ref 4.0–10.5)
nRBC: 0 % (ref 0.0–0.2)

## 2023-02-23 LAB — BASIC METABOLIC PANEL
Anion gap: 9 (ref 5–15)
BUN: 20 mg/dL (ref 8–23)
CO2: 26 mmol/L (ref 22–32)
Calcium: 8.5 mg/dL — ABNORMAL LOW (ref 8.9–10.3)
Chloride: 104 mmol/L (ref 98–111)
Creatinine, Ser: 1.58 mg/dL — ABNORMAL HIGH (ref 0.61–1.24)
GFR, Estimated: 44 mL/min — ABNORMAL LOW (ref >60)
Glucose, Bld: 219 mg/dL — ABNORMAL HIGH (ref 70–99)
Potassium: 5.1 mmol/L (ref 3.5–5.1)
Sodium: 139 mmol/L (ref 135–145)

## 2023-02-23 LAB — MAGNESIUM: Magnesium: 2.2 mg/dL (ref 1.7–2.4)

## 2023-02-23 MED ORDER — DOFETILIDE 250 MCG PO CAPS: 250.0000 ug | ORAL_CAPSULE | Freq: Two times a day (BID) | ORAL | 1 refills | Status: DC

## 2023-02-23 NOTE — Progress Notes (Signed)
Primary Care Physician: [name], MD Primary Cardiologist: Dr. Clifton James Primary Electrophysiologist: Dr Elberta Fortis  Referring Physician: Dr Deanne Coffer Kevin [name] is a 82 y.o. male with a history of PMH of HTN, poorly controlled type 2 diabetes, chronic diastolic CHF, COVID-pneumonia, chronic respiratory failure, COPD/chronic hypoxic RF on oxygen at night, recent RUL nodule, BPH, UTI, CKD stage IIIb, HLD, amputation, CVA, ulcerative colitis and atrial fibrillation who presents for consultation in the [facility]. History of cardioversion in 2019. Seen by Cardiology on 11/25/22 for shortness of breath and was noted to be in Afib without RVR. He underwent DCCV on 12/02/22 with successful conversion to normal rhythm after 2 shocks. He was recently admitted 3/12-18/24 for acute on chronic respiratory failure with hypoxia in the setting of COPD and CHF exacerbation and atrial fibrillation. Patient is on Eliquis 5 mg BID for a CHADS2VASC score of 8.  On evaluation 12/27/22, he is in Afib today. Wife and patient agree that he seems to be more short of breath when he is in Afib. He uses oxygen at night. Review of records demonstrate he underwent DCCV on 3/7 and most likely went back into Afib on 3/12. He has been compliant with Eliquis and has not missed any doses. He has a scheduled bronchoscopy for RUL nodule on 4/8. He will stop taking Eliquis on 4/5 prior to procedure.   He drinks several beers on the weekend.   On follow up today 5/1, he is in Afib. 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 He is scheduled for stereotactic body radiotherapy (SBRT) for this finding on 5/20. He is more short of breath than usual when in Afib. He has not missed any doses of anticoagulation.  On follow up 5/14, patient is in Afib and here today for Tikosyn admission. He has not used benadryl in the past few days. He has not  missed any doses of anticoagulation. He is currently finishing prescription of keflex for cellulitis of lower extremity and only has 2 doses left after today's morning dose. He did transition to macrobid daily. He is currently on effexor and started it on 5/5; notes the current initial dose of 37.5 mg is not helping with anxiety. He is more short of breath as noted before while in Afib.   Follow up in the AF clinic 02/23/23. Patient is s/p dofetilide loading 5/14-5/17/24. He converted with the medication and did not require DCCV. His dose was decreased to 250 mcg as his CrCl with occasionally dip below 60 mL/min. He states that he had been feeling "great" up until this AM. He has felt more anxious and SOB today. ECG shows he is back in afib. He is on O2 nasal canula.    Today, he denies symptoms of palpitations, chest pain, orthopnea, PND, lower extremity edema, dizziness, presyncope, syncope, snoring, daytime somnolence, bleeding, or neurologic sequela. The patient is tolerating medications without difficulties and is otherwise without complaint today.    Atrial Fibrillation Risk Factors:  he does not have symptoms or diagnosis of sleep apnea. he does not have a history of rheumatic fever. he does not have a history of alcohol use. The patient does not have a history of early familial atrial fibrillation or other arrhythmias.  he has a BMI of Body mass index is 30.71 kg/m.Marland Kitchen Filed Weights   02/23/23 1539  Weight: 108.5 kg    Family History  Problem Relation Age  of Onset   Hypertension Father      Atrial Fibrillation Management history:  Previous antiarrhythmic drugs: dofetilide  Previous cardioversions: 2019, 12/02/22 Previous ablations: None Anticoagulation history: Eliquis 5 mg BID   Past Medical History:  Diagnosis Date   A-fib (HCC) 03/20/2018   Acute blood loss anemia    Anxiety    Benign prostatic hyperplasia with urinary retention    Cerebrovascular accident (CVA) (HCC)     CHF (congestive heart failure) (HCC)    COPD (chronic obstructive pulmonary disease) (HCC)    Diabetes mellitus type 2 in nonobese (HCC)    Dyspnea    Dysrhythmia    A-Fib   Essential hypertension 03/20/2018   Former tobacco use    H/O ulcerative colitis 1993   Leukocytosis    Lung cancer (HCC)    Osteomyelitis (HCC)    a. 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   PAF (paroxysmal atrial fibrillation) (HCC)    a. dx 03/2018 in setting of stroke, osteomyelitis.   Stage 3 chronic kidney disease (HCC)    pt/family unaware   Status post transmetatarsal amputation of left foot [facility])    Umbilical hernia    Past Surgical History:  Procedure Laterality Date   AMPUTATION Left 03/24/2018   Procedure: Transmetatarsal Amputation Left Foot;  Surgeon: Nadara Mustard, MD;  Location[facility] OR;  Service: Orthopedics;  Laterality: Left;   BRONCHIAL BIOPSY  01/03/2023   Procedure: BRONCHIAL BIOPSIES;  Surgeon: Leslye Peer, MD;  Location: [facility] ENDOSCOPY;  Service: Pulmonary;;   BRONCHIAL BRUSHINGS  01/03/2023   Procedure: BRONCHIAL BRUSHINGS;  Surgeon: Leslye Peer, MD;  Location: [facility] ENDOSCOPY;  Service: Pulmonary;;   BRONCHIAL NEEDLE ASPIRATION BIOPSY  01/03/2023   Procedure: BRONCHIAL NEEDLE ASPIRATION BIOPSIES;  Surgeon: Leslye Peer, MD;  Location: [facility] ENDOSCOPY;  Service: Pulmonary;;   CARDIOVERSION N/A 05/05/2018   Procedure: CARDIOVERSION;  Surgeon: Chilton Si, MD;  Location: [facility] ENDOSCOPY;  Service: Cardiovascular;  Laterality: N/A;   CARDIOVERSION N/A 12/02/2022   Procedure: CARDIOVERSION;  Surgeon: Jodelle Red, MD;  Location: [facility] ENDOSCOPY;  Service: Cardiovascular;  Laterality: N/A;   COLONOSCOPY  12/2017   benign, remission from ulcerative colitis in 90s   FIDUCIAL MARKER PLACEMENT  01/03/2023   Procedure: FIDUCIAL MARKER PLACEMENT;  Surgeon: Leslye Peer, MD;  Location: [facility] ENDOSCOPY;  Service: Pulmonary;;   I & D EXTREMITY Left 03/17/2018   Procedure: IRRIGATION AND DEBRIDEMENT  FOOT;  Surgeon: Yolonda Kida, MD;  Location: [facility] OR;  Service: Orthopedics;  Laterality: Left;   STUMP REVISION Left 05/10/2018   Procedure: REVISION LEFT TRANSMETATARSAL AMPUTATION;  Surgeon: Nadara Mustard, MD;  Location: [facility] OR;  Service: Orthopedics;  Laterality: Left;   VIDEO BRONCHOSCOPY WITH RADIAL ENDOBRONCHIAL ULTRASOUND  01/03/2023   Procedure: VIDEO BRONCHOSCOPY WITH RADIAL ENDOBRONCHIAL ULTRASOUND;  Surgeon: Leslye Peer, MD;  Location: [facility] ENDOSCOPY;  Service: Pulmonary;;    Current Outpatient Medications  Medication Sig Dispense Refill   acetaminophen (TYLENOL) 500 MG tablet Take 1,000 mg by mouth at bedtime as needed (pain.).     albuterol (VENTOLIN HFA) 108 (90 Base) MCG/ACT inhaler Inhale 2 puffs into the lungs every 6 (six) hours as needed for wheezing or shortness of breath. 24 g 1   apixaban (ELIQUIS) 5 MG TABS tablet Take 1 tablet (5 mg total) by mouth 2 (two) times daily. Okay to restart this medication with your evening dose on 01/04/2023 180 tablet 1   Dextromethorphan HBr (DELSYM PO) Take 10 mLs by mouth 2 (  two) times daily.     diltiazem (CARDIZEM CD) 120 MG 24 hr capsule Take 1 capsule (120 mg total) by mouth daily. Take at breakfast. 90 capsule 1   guaiFENesin (MUCINEX) 600 MG 12 hr tablet Take 600 mg by mouth 2 (two) times daily.     insulin aspart protamine - aspart (NOVOLOG 70/30 MIX) (70-30) 100 UNIT/ML FlexPen Inject 27 Units into the skin 2 (two) times daily with a meal. 15 mL 11   Insulin Pen Needle (PEN NEEDLES 3/16") 31G X 5 MM MISC 1 Pen by Does not apply route 2 (two) times daily before a meal. Before breakfast and dinner 100 each 1   loratadine (CLARITIN) 10 MG tablet Take 10 mg by mouth at bedtime.     melatonin 3 MG TABS tablet Take 6 mg by mouth at bedtime.     mesalamine (LIALDA) 1.2 g EC tablet Take 1.2 g by mouth in the morning.     metoprolol succinate (TOPROL-XL) 50 MG 24 hr tablet Take 1 tablet (50 mg total) by mouth every evening.      Multiple Vitamins-Minerals (PRESERVISION AREDS 2 PO) Take 1 capsule by mouth 2 (two) times daily.     mupirocin cream (BACTROBAN) 2 % Apply 1 Application topically 2 (two) times daily. 15 g 0   nitrofurantoin, macrocrystal-monohydrate, (MACROBID) 100 MG capsule Take 100 mg by mouth at bedtime.     rosuvastatin (CRESTOR) 10 MG tablet Take 0.5 tablets (5 mg total) by mouth daily. 45 tablet 1   senna-docusate (SENOKOT-S) 8.6-50 MG tablet Take 1 tablet by mouth at bedtime. (Patient taking differently: Take 1 tablet by mouth at bedtime as needed (constipation.).)     tamsulosin (FLOMAX) 0.4 MG CAPS capsule Take 1 capsule (0.4 mg total) by mouth 2 times daily at 12 noon and 4 pm. (Patient taking differently: Take 0.4 mg by mouth in the morning and at bedtime.) 60 capsule 0   Tiotropium Bromide-Olodaterol (STIOLTO RESPIMAT) 2.5-2.5 MCG/ACT AERS Inhale 2 puffs into the lungs daily. 4 g 11   venlafaxine XR (EFFEXOR-XR) 75 MG 24 hr capsule Take 1 capsule (75 mg total) by mouth in the morning. 90 capsule 1   dofetilide (TIKOSYN) 250 MCG capsule Take 1 capsule (250 mcg total) by mouth 2 (two) times daily. 180 capsule 1   No current facility-administered medications for this encounter.    Allergies  Allergen Reactions   Other     Per spouse, pt needs to avoid any abx that could potentially cause prolonged QT syndrome    Social History   Socioeconomic History   Marital status: Married    Spouse name: Not on file   Number of children: Not on file   Years of education: Not on file   Highest education level: Bachelor's degree (e.g., BA, AB, BS)  Occupational History   Not on file  Tobacco Use   Smoking status: Former    Packs/day: 1.00    Years: 20.00    Additional pack years: 0.00    Total pack years: 20.00    Types: Cigarettes    Start date: 1963    Quit date: 1985    Years since quitting: 39.4   Smokeless tobacco: Never   Tobacco comments:    Former smoker 40 yrs ago  Vaping Use   Vaping  Use: Never used  Substance and Sexual Activity   Alcohol use: Yes    Alcohol/week: 6.0 standard drinks of alcohol    Types: 6 Cans of  beer per week    Comment: 6 pack every weekend 01/26/23   Drug use: Never   Sexual activity: Not on file  Other Topics Concern   Not on file  Social History Narrative   Lives at home with his wife and two cats. Married for almost 45 years   Right handed   Caffeine: 1 cup every morning   Social Determinants of Health   Financial Resource Strain: Not on file  Food Insecurity: No Food Insecurity (02/08/2023)   Hunger Vital Sign    Worried About Running Out of Food in the Last Year: Never true    Ran Out of Food in the Last Year: Never true  Transportation Needs: No Transportation Needs (02/08/2023)   PRAPARE - Administrator, Civil Service (Medical): No    Lack of Transportation (Non-Medical): No  Physical Activity: Not on file  Stress: Not on file  Social Connections: Not on file  Intimate Partner Violence: Not At Risk (02/08/2023)   Humiliation, Afraid, Rape, and Kick questionnaire    Fear of Current or Ex-Partner: No    Emotionally Abused: No    Physically Abused: No    Sexually Abused: No     ROS- All systems are reviewed and negative except as per the HPI above.  Physical Exam: Vitals:   02/23/23 1539  BP: 132/76  Pulse: 98  Weight: 108.5 kg  Height: 6\' 2"  (1.88 m)     GEN- The patient is a well appearing elderly male, alert and oriented x 3 today.   HEENT-head normocephalic, atraumatic, sclera clear, conjunctiva pink, hearing intact, trachea midline. Lungs- Clear to ausculation bilaterally, normal work of breathing Heart- irregular rate and rhythm, no murmurs, rubs or gallops  GI- soft, NT, ND, + BS Extremities- no clubbing, cyanosis, or edema MS- no significant deformity or atrophy Skin- no rash or lesion Psych- euthymic mood, full affect Neuro- strength and sensation are intact   Wt Readings from Last 3  Encounters:  02/23/23 108.5 kg  02/18/23 110.3 kg  02/08/23 112.1 kg    EKG today demonstrates  Afib, RBBB Vent. rate 98 BPM PR interval * ms QRS duration 152 ms QT/QTcB 424/541 ms  Echo 12/08/22 demonstrated: 1. Left ventricular ejection fraction, by estimation, is 55 to 60%. The  left ventricle has normal function. The left ventricle has no regional  wall motion abnormalities. Left ventricular diastolic parameters are  indeterminate. There is the interventricular septum is flattened in systole, suggestive of right ventricular pressure overload.   2. Right ventricular systolic function is mildly reduced. The right  ventricular size is moderately enlarged. There is normal pulmonary artery  systolic pressure. The estimated right ventricular systolic pressure is  30.5 mmHg.   3. Left atrial size was mildly dilated.   4. Right atrial size was moderately dilated.   5. The mitral valve is grossly normal. Trivial mitral valve  regurgitation. No evidence of mitral stenosis.   6. The aortic valve is tricuspid. There is mild calcification of the  aortic valve. There is mild thickening of the aortic valve. Aortic valve  regurgitation is not visualized. No aortic stenosis is present.   7. The inferior vena cava is normal in size with greater than 50%  respiratory variability, suggesting right atrial pressure of 3 mmHg.    Epic records are reviewed at length today.  CHA2DS2-VASc Score = 8  The patient's score is based upon: CHF History: 1 HTN History: 1 Diabetes History: 1 Stroke History:  2 Vascular Disease History: 1 Age Score: 2 Gender Score: 0       ASSESSMENT AND PLAN: 1. Persistent Atrial Fibrillation (ICD10:  I48.0) The patient's CHA2DS2-VASc score is 8, indicating a 10.8% annual risk of stroke.   S/p dofetilide loading 5/14-5/17/24 Patient back in afib, likely since this morning when his symptoms started. We discussed options today. If he converts to SR quickly on his  own, will continue dofetilide and follow up as scheduled. If he remains out of rhythm, will arrange for DCCV. We discussed using a Zio monitor to evaluate if he is paroxysmal or persistent. Will defer monitor until he finishes his radiation treatment on Monday. Will bring him back Tuesday next week for ECG.  Continue dofetilide 250 mcg BID Check bmet/mag/cbc today. Continue Eliquis 5 mg BID Continue diltiazem 120 mg daily Continue metoprolol 50 mg daily  2. 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  Continue Apixaban (Eliquis).  No missed doses.  3. HTN Stable, no changes today.  4. Chronic diastolic CHF Fluid status appears stable.    Follow up in the AF clinic next week for ECG.    Jorja Loa PA-C Afib Clinic [facility] [address] [place], [place] [address] [phone] 02/23/2023 4:32 PM

## 2023-02-24 ENCOUNTER — Ambulatory Visit
Admission: RE | Admit: 2023-02-24 | Discharge: 2023-02-24 | Disposition: A | Discharge status: Home / Self Care | Payer: Medicare Other | Source: Ambulatory Visit | Attending: Radiation Oncology | Admitting: Radiation Oncology

## 2023-02-24 ENCOUNTER — Other Ambulatory Visit: Payer: Self-pay

## 2023-02-24 DIAGNOSIS — Z510 Encounter for antineoplastic radiation therapy: Secondary | ICD-10-CM | POA: Diagnosis not present

## 2023-02-24 DIAGNOSIS — C3411 Malignant neoplasm of upper lobe, right bronchus or lung: Secondary | ICD-10-CM | POA: Diagnosis not present

## 2023-02-24 DIAGNOSIS — C3412 Malignant neoplasm of upper lobe, left bronchus or lung: Secondary | ICD-10-CM | POA: Diagnosis not present

## 2023-02-24 LAB — RAD ONC ARIA SESSION SUMMARY
Course Elapsed Days: 8
Plan Fractions Treated to Date: 4
Plan Fractions Treated to Date: 4
Plan Prescribed Dose Per Fraction: 12 Gy
Plan Prescribed Dose Per Fraction: 12 Gy
Plan Total Fractions Prescribed: 5
Plan Total Fractions Prescribed: 5
Plan Total Prescribed Dose: 60 Gy
Plan Total Prescribed Dose: 60 Gy
Reference Point Dosage Given to Date: 48 Gy
Reference Point Dosage Given to Date: 48 Gy
Reference Point Session Dosage Given: 12 Gy
Reference Point Session Dosage Given: 12 Gy
Session Number: 4

## 2023-02-27 ENCOUNTER — Telehealth: Payer: Self-pay | Admitting: Student

## 2023-02-27 NOTE — Telephone Encounter (Signed)
**  After Hours/ Emergency Line Call**  Received a page to call [phone]) - [phone].  Patient: Kevin [name]  Caller:  Wife of patient Confirmed name & DOB of patient with caller  Subjective:  Went to establish care w/ Dr. Marisue Humble, was seen for anxiety. Was started on efexor, but three nights ago he was hallucinating (seeing people in house, thought it was meth lab) and paranoid (Suspicious), also note's he's been up all night. This morning/afternoon he woke up and was lucid but still paranoid. He has been sleeping this day, presently.   Objective:  Observations: NAD   Assessment & Plan  Adithya Alman is a 82 y.o. male with PMHx s/f aniexty who calls with the following complaints and concerns hallucinations and parnoia. Patient appears to be responding poorly to the Effexor that was recently started. Told them to discontinue for now, and to come in for appt on Tuesday. Also instructed wife to work on sleep schedule, keeping patient awake during the day, to better sleep at night. She was agreeable.   -F/u appt 6/4 1:50 pm    Recommendations:  -f/u appt 6/4 Avoid sleeping during day    -- Will forward to PCP.  Bess Kinds, MD [facility], PGY-1

## 2023-02-28 ENCOUNTER — Ambulatory Visit: Payer: Medicare Other

## 2023-02-28 ENCOUNTER — Other Ambulatory Visit: Payer: Self-pay | Admitting: Radiation Oncology

## 2023-02-28 ENCOUNTER — Ambulatory Visit
Admission: RE | Admit: 2023-02-28 | Discharge: 2023-02-28 | Disposition: A | Discharge status: Home / Self Care | Payer: Medicare Other | Source: Ambulatory Visit | Attending: Radiation Oncology | Admitting: Radiation Oncology

## 2023-02-28 ENCOUNTER — Other Ambulatory Visit: Payer: Self-pay

## 2023-02-28 DIAGNOSIS — F419 Anxiety disorder, unspecified: Secondary | ICD-10-CM

## 2023-02-28 DIAGNOSIS — C3411 Malignant neoplasm of upper lobe, right bronchus or lung: Secondary | ICD-10-CM | POA: Diagnosis not present

## 2023-02-28 DIAGNOSIS — Z510 Encounter for antineoplastic radiation therapy: Secondary | ICD-10-CM | POA: Diagnosis not present

## 2023-02-28 DIAGNOSIS — C3412 Malignant neoplasm of upper lobe, left bronchus or lung: Secondary | ICD-10-CM | POA: Insufficient documentation

## 2023-02-28 DIAGNOSIS — Z87891 Personal history of nicotine dependence: Secondary | ICD-10-CM | POA: Diagnosis not present

## 2023-02-28 LAB — RAD ONC ARIA SESSION SUMMARY
Course Elapsed Days: 12
Plan Fractions Treated to Date: 5
Plan Fractions Treated to Date: 5
Plan Prescribed Dose Per Fraction: 12 Gy
Plan Prescribed Dose Per Fraction: 12 Gy
Plan Total Fractions Prescribed: 5
Plan Total Fractions Prescribed: 5
Plan Total Prescribed Dose: 60 Gy
Plan Total Prescribed Dose: 60 Gy
Reference Point Dosage Given to Date: 60 Gy
Reference Point Dosage Given to Date: 60 Gy
Reference Point Session Dosage Given: 12 Gy
Reference Point Session Dosage Given: 12 Gy
Session Number: 5

## 2023-03-01 ENCOUNTER — Ambulatory Visit (HOSPITAL_COMMUNITY)
Admission: RE | Admit: 2023-03-01 | Discharge: 2023-03-01 | Disposition: A | Discharge status: Home / Self Care | Payer: Medicare Other | Source: Ambulatory Visit | Attending: Internal Medicine | Admitting: Internal Medicine

## 2023-03-01 ENCOUNTER — Telehealth: Payer: Self-pay

## 2023-03-01 ENCOUNTER — Encounter: Payer: Self-pay | Admitting: Student

## 2023-03-01 ENCOUNTER — Ambulatory Visit (INDEPENDENT_AMBULATORY_CARE_PROVIDER_SITE_OTHER): Payer: Medicare Other | Admitting: Student

## 2023-03-01 ENCOUNTER — Inpatient Hospital Stay (HOSPITAL_COMMUNITY)
Admission: RE | Admit: 2023-03-01 | Discharge: 2023-03-01 | Disposition: A | Discharge status: Home / Self Care | Payer: Medicare Other | Source: Ambulatory Visit | Attending: Internal Medicine | Admitting: Internal Medicine

## 2023-03-01 ENCOUNTER — Other Ambulatory Visit: Payer: Self-pay

## 2023-03-01 VITALS — BP 149/137 | HR 104 | Ht 74.0 in | Wt 232.9 lb

## 2023-03-01 VITALS — BP 150/88 | HR 100

## 2023-03-01 DIAGNOSIS — I480 Paroxysmal atrial fibrillation: Secondary | ICD-10-CM | POA: Diagnosis not present

## 2023-03-01 DIAGNOSIS — F419 Anxiety disorder, unspecified: Secondary | ICD-10-CM

## 2023-03-01 DIAGNOSIS — C3411 Malignant neoplasm of upper lobe, right bronchus or lung: Secondary | ICD-10-CM

## 2023-03-01 DIAGNOSIS — I4819 Other persistent atrial fibrillation: Secondary | ICD-10-CM | POA: Diagnosis not present

## 2023-03-01 DIAGNOSIS — I10 Essential (primary) hypertension: Secondary | ICD-10-CM

## 2023-03-01 DIAGNOSIS — J449 Chronic obstructive pulmonary disease, unspecified: Secondary | ICD-10-CM

## 2023-03-01 MED ORDER — CLONAZEPAM 0.5 MG PO TABS
0.5000 mg | ORAL_TABLET | Freq: Three times a day (TID) | ORAL | 1 refills | Status: DC | PRN
Start: 2023-03-01 — End: 2023-05-18

## 2023-03-01 NOTE — Addendum Note (Signed)
Encounter addended by: Shona Simpson, RN on: 03/01/2023 4:04 PM  Actions taken: Order list changed, Diagnosis association updated

## 2023-03-01 NOTE — Assessment & Plan Note (Signed)
BP grossly elevated in clinic today x 2.  Query to what degree his acute and untreated anxiety is driving up his blood pressure.  I hesitate to adjust any of his cardiac meds given that cardiology is so intimately involved in his care at this point.  He is actually on his way to A-fib clinic right now, therefore I will defer making any changes to his cardiac meds to them.  Will reach out to the PA who will be seeing him in their clinic.

## 2023-03-01 NOTE — Progress Notes (Signed)
Patient appears to be in Afib. Patient and wife believe he is going in and out of Afib. PCP will be prescribing patient Klonopin prn anxiety and there is no interaction with Tikosyn identified so good to proceed.  We will place a cardiac monitor for 2 weeks to confirm whether paroxysmal or persistent.  ECG today shows  Vent. rate 100 BPM PR interval * ms QRS duration 154 ms QT/QTcB 406/523 ms P-R-T axes * -83 -76 Undetermined rhythm Left axis deviation Right bundle branch block T wave abnormality, consider lateral ischemia Abnormal ECG When compared with ECG of 23-Feb-2023 16:01, PREVIOUS ECG IS PRESENT   Pt will follow up with me in 6 weeks to discuss monitor results.

## 2023-03-01 NOTE — Assessment & Plan Note (Addendum)
Patient with uncertain prognosis at this point but a number of serious comorbidities that are of concern. They tell me that they completed radiation treatments yesterday. Palliative care has been consulted and plans to become increasingly involved from this point per my discussion with them this morning. I think that is quite appropriate. Patient and his wife are in agreement.

## 2023-03-01 NOTE — Assessment & Plan Note (Signed)
Seems to be going in and out of A-fib in the clinic, on initial palpation of pulses, heart rate appeared normal and regular but on cardiac auscultation was tachycardic and irregular. No CP or increase in SOB from baseline. Is on his way to A-fib clinic this afternoon so defer EKG and medication management to their clinic. - Continue Eliquis 5mg  BID - Defer EKG and medication adjustments to A-fib clinic. Has an appointment at 1530 this afternoon.

## 2023-03-01 NOTE — Assessment & Plan Note (Signed)
Significant symptoms.  Unfortunately, has failed Effexor and we are limited in what we can use given his concomitant Tikosyn use.  Need to avoid any meds with QT prolongation or that may affect metabolism of Tikosyn.  At this point, I believe that his best option may be a benzodiazepine.  Would favor something longer acting such as Klonopin.  Homero Fellers discussion with him that this does have the potential to decrease his respiratory drive if taking an overdose.  Will therefore start at low doses and have he and his wife closely monitor his symptoms.  Given his active pulmonology cancer and severe COPD, guarding his respiratory status is of utmost concern. -Klonopin 0.5 mg up to 3 times daily as needed -Already has follow-up with me in 3 weeks

## 2023-03-01 NOTE — Patient Instructions (Signed)
Mr. Cosner,  Sounds like the Effexor was a bad idea. Sorry for the stress that was for you and your wife. Let's move to something else. I think you're at the point that you require a benzodiazepine. I'm writing this for 0.5mg  up to three times daily as needed. See how this does and let's catch up in a month or so to see if we need to increase the dose or adjust the frequency.   I will reach out via mychart once I get the AF clinic's blessing.  [name], MD

## 2023-03-01 NOTE — Assessment & Plan Note (Signed)
SpO2 87% but not wearing home O2 in clinic. Comfortable on room air despite mild hypoxia. Minimal wheeze on exam. - Continued need for home O2 - Continue current management

## 2023-03-01 NOTE — Progress Notes (Signed)
SUBJECTIVE:   CHIEF COMPLAINT / HPI:   Anxiety-  Significant symptoms that are greatly impacting his life.  Was previously on Lexapro but was asked to stop this by his cardiologist after starting Tikosyn for his A-fib.  Was placed on venlafaxine.  Initial dose of 37.5 mg seem to make the difference whatsoever.  This was increased to 75 mg daily at our last visit.  Unfortunately, with this change he developed significant side effects including hallucinations and paranoias. He has many reasons to be anxious including uncertain prognosis with his newly diagnosed non-small cell lung cancer for which she recently started radiation oncology treatment.  There is a referral to palliative care and I discussed his case with [facility] this morning, they plan to "refocus service delivery effective immediately, allowing for increased visit frequency and timeliness of care."  A Fib Recently loaded on Tikosyn 5/14 through 5/17.  Unfortunately, reverted to A-fib on 5/29.  Plan per cardiology's last note was for a repeat EKG today at 1530.  He is anticoagulated on Eliquis twice daily.  No missed doses. He tells me he feels better today than he did when he was seen on 5/29, denies any CP or increase in SOB from baseline.   PERTINENT  PMH / PSH: non-small cell Lung CA followed by Dr. [name] and recently started rad onc treatment,  Afib on tikosyn, COPD on home O2, Chronic diastolic, CKD IIIb, Stroke, PVD, Anxiety, chronic Left LE wound, ulcerative colitis   OBJECTIVE:   BP (!) 149/137   Pulse (!) 104   Ht 6\' 2"  (1.88 m)   Wt 232 lb 14.4 oz (105.6 kg)   SpO2 (!) 87%   BMI 29.90 kg/m   Gen: Alert, anxious but does not appear in acute distress  Cardiac: On initial palpation of pulses, heart rate appeared normal and regular but on cardiac auscultation was tachycardic and irregular. Heart sounds are distant.  Pulm: Not wearing home O2. Normal WOB on RA. Prolonged expiratory phase and faint, intermittent  expiratory wheeze. No focal findings on pulmonary exam.  Psych: He is anxious.  ASSESSMENT/PLAN:   Anxiety Significant symptoms.  Unfortunately, has failed Effexor and we are limited in what we can use given his concomitant Tikosyn use.  Need to avoid any meds with QT prolongation or that may affect metabolism of Tikosyn.  At this point, I believe that his best option may be a benzodiazepine.  Would favor something longer acting such as Klonopin.  Kevin [name] discussion with him that this does have the potential to decrease his respiratory drive if taking an overdose.  Will therefore start at low doses and have he and his wife closely monitor his symptoms.  Given his active pulmonology cancer and severe COPD, guarding his respiratory status is of utmost concern. -Klonopin 0.5 mg up to 3 times daily as needed -Already has follow-up with me in 3 weeks  Hypertension BP grossly elevated in clinic today x 2.  Query to what degree his acute and untreated anxiety is driving up his blood pressure.  I hesitate to adjust any of his cardiac meds given that cardiology is so intimately involved in his care at this point.  He is actually on his way to A-fib clinic right now, therefore I will defer making any changes to his cardiac meds to them.  Will reach out to the PA who will be seeing him in their clinic.  PAF (paroxysmal atrial fibrillation) (HCC) Seems to be going in and out of  A-fib in the clinic, on initial palpation of pulses, heart rate appeared normal and regular but on cardiac auscultation was tachycardic and irregular. No CP or increase in SOB from baseline. Is on his way to A-fib clinic this afternoon so defer EKG and medication management to their clinic. - Continue Eliquis 5mg  BID - Defer EKG and medication adjustments to A-fib clinic. Has an appointment at 1530 this afternoon.   COPD (chronic obstructive pulmonary disease) (HCC) SpO2 87% but not wearing home O2 in clinic. Comfortable on room air  despite mild hypoxia. Minimal wheeze on exam. - Continued need for home O2 - Continue current management  Adenocarcinoma of upper lobe of right lung [facility]) Patient with uncertain prognosis at this point but a number of serious comorbidities that are of concern. They tell me that they completed radiation treatments yesterday. Palliative care has been consulted and plans to become increasingly involved from this point per my discussion with them this morning. I think that is quite appropriate. Patient and his wife are in agreement.      Kevin Mccoy, MD [facility]

## 2023-03-01 NOTE — Telephone Encounter (Signed)
(  4:14 pm) PC left a message for patient's wife to request a call back regarding the palliative care referral.

## 2023-03-02 ENCOUNTER — Telehealth: Payer: Self-pay

## 2023-03-02 NOTE — Telephone Encounter (Signed)
(  10:34 am)  PC SW returned call to patient's wife-Kevin [name]. SW provided education to her regarding the palliative care program, services, visit frequency and role in care. She declined services. She stated that patient 's care is being managed by all his doctor's. She stated that he has a lot of anxiety and wanted him to see a psychiatrist that could manage his mediations for anxiety. SW provided resource for [employer]. SW provided her [facility] palliative care contact information to call if patient has changes. SW advised her that patient will be moved to our inactive list.  No other concerns noted

## 2023-03-03 ENCOUNTER — Telehealth: Payer: Self-pay | Admitting: Adult Health

## 2023-03-03 ENCOUNTER — Other Ambulatory Visit: Payer: Self-pay

## 2023-03-03 NOTE — Telephone Encounter (Signed)
ATCX1 LVM for patients spouse Kevin [name]. Please advise we can't send an electronic script to [place] but see if a printed prescription will work 

## 2023-03-03 NOTE — Telephone Encounter (Signed)
ATCX1 LVM for patients spouse Byrd Hesselbach. Please advise we can't send an electronic script to [place] but see if a printed prescription will work

## 2023-03-03 NOTE — Telephone Encounter (Signed)
Pt wife called in bc [facility] is requesting for prescription be sent in Tiotropium Bromide-Olodaterol (STIOLTO RESPIMAT) 2.5-2.5 MCG/ACT AERS

## 2023-03-03 NOTE — Radiation Completion Notes (Addendum)
  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   Intent: Curative     ==========DELIVERED PLANS==========  First Treatment Date: 2023-02-16 - Last Treatment Date: 2023-02-28   Plan Name: Lung_R_SBRT Site: Lung, Right Technique: SBRT/SRT-IMRT Mode: Photon Dose Per Fraction: 12 Gy Prescribed Dose (Delivered / Prescribed): 60 Gy / 60 Gy Prescribed Fxs (Delivered / Prescribed): 5 / 5   Plan Name: Lung_L_SBRT Site: Lung, Left Technique: SBRT/SRT-IMRT Mode: Photon Dose Per Fraction: 12 Gy Prescribed Dose (Delivered / Prescribed): 60 Gy / 60 Gy Prescribed Fxs (Delivered / Prescribed): 5 / 5     ==========ON TREATMENT VISIT DATES========== 2023-02-16, 2023-02-16, 2023-02-18, 2023-02-18, 2023-02-22, 2023-02-22, 2023-02-24, 2023-02-24, 2023-02-28, 2023-02-28, 2023-02-28     ==========UPCOMING VISITS========== 2023-03-25 [facility] Wait List Kathleene Hazel, MD; [name], MD     See weekly On Treatment Notes in Epic for details.The patient tolerated radiation.   The patient will have a short interval scan to follow up on a LLL nodule as well as receive a call in about one month from the radiation oncology department. He will continue follow up with Dr. Arbutus Ped as well.      Osker Mason, PAC

## 2023-03-07 NOTE — Telephone Encounter (Signed)
Pt returning missed call. Advised pty that we can't send a electronic script to [place]. Pt wants [place] to print off the prescription and she will mail it where she can

## 2023-03-09 ENCOUNTER — Other Ambulatory Visit: Payer: Self-pay

## 2023-03-09 MED ORDER — STIOLTO RESPIMAT 2.5-2.5 MCG/ACT IN AERS: 2.0000 | INHALATION_SPRAY | Freq: Every day | RESPIRATORY_TRACT | 12 refills | Status: DC

## 2023-03-09 NOTE — Telephone Encounter (Signed)
Spoke with patients wife. I have printed script and placed in the mail. NFN

## 2023-03-10 ENCOUNTER — Ambulatory Visit: Payer: Medicare Other | Attending: Physician Assistant | Admitting: Physician Assistant

## 2023-03-10 ENCOUNTER — Encounter: Payer: Self-pay | Admitting: Physician Assistant

## 2023-03-10 VITALS — BP 100/60 | HR 82 | Ht 74.0 in | Wt 232.0 lb

## 2023-03-10 DIAGNOSIS — I10 Essential (primary) hypertension: Secondary | ICD-10-CM | POA: Diagnosis not present

## 2023-03-10 DIAGNOSIS — I4891 Unspecified atrial fibrillation: Secondary | ICD-10-CM | POA: Insufficient documentation

## 2023-03-10 DIAGNOSIS — I5032 Chronic diastolic (congestive) heart failure: Secondary | ICD-10-CM

## 2023-03-10 DIAGNOSIS — J449 Chronic obstructive pulmonary disease, unspecified: Secondary | ICD-10-CM

## 2023-03-10 NOTE — Progress Notes (Signed)
Cardiology Office Note:  .   Date:  03/10/2023  ID:  Kevin [name], DOB 02-Apr-1941, MRN [id number] PCP: Kevin Amel, MD  [facility] Providers Cardiologist:  Verne Carrow, MD {  History of Present Illness: .   Kevin [name] is a 82 y.o. male with a past medical history of hypertension, poorly controlled diabetes melitis type II, chronic diastolic CHF, COVID-pneumonia, chronic respiratory failure, COPD/chronic hypoxic RF on oxygen at night, recent RUL nodule, BPH, UTI, CKD stage IIIb, HLD, amputation, CVA, ulcerative colitis and atrial fibrillation who was recently seen by the A-fib clinic 02/23/2023.  History of cardioversion in 2019.  Seen by cardiology 11/17/2022 for shortness of breath he was noted to be in atrial fibrillation without RVR.  Underwent DCCV 12/02/2022 with successful cardioversion to normal sinus rhythm after 2 shocks.  Was then subsequently admitted 3/12 through 12/13/2022 for acute on chronic respiratory failure with hypoxia in the setting of COPD and CHF exacerbation and atrial fibrillation.  Patient is on Eliquis 5 mg twice daily for CHA2DS2-VASc score of 8.  He was seen for evaluation April 1 and was in atrial fibrillation.  He was more short of breath.  He was compliant with his Eliquis.  He did endorse drinking several beers on the weekend.  Follow-up May 1 was also in atrial relation.  Since his last visit underwent bronchoscopy and was found to have lung carcinoma.  Scheduled for SBRT on 5/20.  More short of breath than usual.  Was then seen 5/14 and was in atrial fibrillation had not been on any Benadryl.  Had not missed any doses of anticoagulation.  Was on Keflex for cellulitis.  When he was last seen 02/23/2023 he was status post dofetilide loading 5/14 through 5/17.  Converted with medication and did not require DCCV.  Dose was decreased to 250 mcg because of creatinine.  EKG did show he was back in A-fib.  Today, he tells me he is wearing  a ZIO monitor.  The afib clinic ordered to look at burden. Some SOB but only when in Afib, when he sits down it seems to be okay again. Oxygen at night, pulse ox is 92% on RA today. He has been doing as much walking as he can but no organized PT. 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 He is euvolemic on exam today.   Reports no shortness of breath nor dyspnea on exertion. Reports no chest pain, pressure, or tightness. No edema, orthopnea, PND.     ROS: Refer to HPI for pertinent ROS  Studies Reviewed: Marland Kitchen    EKG: Normal sinus rhythm, RBBB (old), 82 bpm  Echo 12/08/22  IMPRESSIONS     1. Left ventricular ejection fraction, by estimation, is 55 to 60%. The  left ventricle has normal function. The left ventricle has no regional  wall motion abnormalities. Left ventricular diastolic parameters are  indeterminate. There is the  interventricular septum is flattened in systole, suggestive of right  ventricular pressure overload.   2. Right ventricular systolic function is mildly reduced. The right  ventricular size is moderately enlarged. There is normal pulmonary artery  systolic pressure. The estimated right ventricular systolic pressure is  30.5 mmHg.   3. Left atrial size was mildly dilated.   4. Right atrial size was moderately dilated.   5. The mitral valve is grossly normal. Trivial mitral valve  regurgitation. No evidence of mitral stenosis.   6. The aortic valve  is tricuspid. There is mild calcification of the  aortic valve. There is mild thickening of the aortic valve. Aortic valve  regurgitation is not visualized. No aortic stenosis is present.   7. The inferior vena cava is normal in size with greater than 50%  respiratory variability, suggesting right atrial pressure of 3 mmHg.   FINDINGS   Left Ventricle: Left ventricular ejection fraction, by estimation, is 55  to 60%. The left ventricle has normal function. The left ventricle has no  regional  wall motion abnormalities. The left ventricular internal cavity  size was normal in size. There is   no left ventricular hypertrophy. The interventricular septum is flattened  in systole, consistent with right ventricular pressure overload. Left  ventricular diastolic parameters are indeterminate.   Right Ventricle: The right ventricular size is moderately enlarged. No  increase in right ventricular wall thickness. Right ventricular systolic  function is mildly reduced. There is normal pulmonary artery systolic  pressure. The tricuspid regurgitant  velocity is 2.62 m/s, and with an assumed right atrial pressure of 3 mmHg,  the estimated right ventricular systolic pressure is 30.5 mmHg.   Left Atrium: Left atrial size was mildly dilated.   Right Atrium: Right atrial size was moderately dilated.   Pericardium: There is no evidence of pericardial effusion.   Mitral Valve: The mitral valve is grossly normal. Trivial mitral valve  regurgitation. No evidence of mitral valve stenosis. MV peak gradient, 2.8  mmHg. The mean mitral valve gradient is 1.0 mmHg.   Tricuspid Valve: The tricuspid valve is normal in structure. Tricuspid  valve regurgitation is mild . No evidence of tricuspid stenosis.   Aortic Valve: The aortic valve is tricuspid. There is mild calcification  of the aortic valve. There is mild thickening of the aortic valve. Aortic  valve regurgitation is not visualized. No aortic stenosis is present.  Aortic valve mean gradient measures  1.0 mmHg. Aortic valve peak gradient measures 2.7 mmHg. Aortic valve area,  by VTI measures 2.78 cm.   Pulmonic Valve: The pulmonic valve was normal in structure. Pulmonic valve  regurgitation is not visualized. No evidence of pulmonic stenosis.   Aorta: The aortic root is normal in size and structure. Ascending aorta  measurements are within normal limits for age when indexed to body surface  area.   Venous: The inferior vena cava is  normal in size with greater than 50%  respiratory variability, suggesting right atrial pressure of 3 mmHg.   IAS/Shunts: No atrial level shunt detected by color flow Doppler.   Risk Assessment/Calculations:    CHA2DS2-VASc Score = 8   This indicates a 10.8% annual risk of stroke. The patient's score is based upon: CHF History: 1 HTN History: 1 Diabetes History: 1 Stroke History: 2 Vascular Disease History: 1 Age Score: 2 Gender Score: 0          Physical Exam:   VS:  BP 100/60   Pulse 82   Ht 6\' 2"  (1.88 m)   Wt 232 lb (105.2 kg)   SpO2 92%   BMI 29.79 kg/m    Wt Readings from Last 3 Encounters:  03/10/23 232 lb (105.2 kg)  03/01/23 232 lb 14.4 oz (105.6 kg)  02/23/23 239 lb 3.2 oz (108.5 kg)    GEN: Well nourished, well developed in no acute distress NECK: No JVD; No carotid bruits CARDIAC: Irregularly irregular, no murmurs, rubs, gallops RESPIRATORY:  Clear to auscultation without rales, wheezing or rhonchi  ABDOMEN: Soft, non-tender, non-distended EXTREMITIES:  No edema; No deformity   ASSESSMENT AND PLAN: .   Afib -he did miss a dose of tikosyn but it was an accident -Eliquis 5mg  BID no missed doses  -no bleeding issues -takes klonopin at night and it helps his anxiety -EKG showed normal sinus rhythm but on auscultation sounded like atrial fibrillation -Continue ZIO monitor to decide A-fib burden -Close follow-up with A-fib clinic  Chronic diastolic CHF -SOB when he is out of rhythm -no LE edema, euvolemic on exam -Continue current medications which include Eliquis 5 mg twice a day, Cardizem CD 120 mg daily, Tikosyn 250 mcg twice a day, metoprolol succinate 50 mg daily, Crestor 5 mg daily  COPD -he follows with Dr. Delton Coombes, cancerous lesions on his lungs. Dr. Jerolyn Center and Dr. Mitzi Hansen follow him as well -SOB seems to be when he is out of rhythm  HTN -Blood pressure is low normal today -Not having any lightheadedness or dizziness -Continue current  medications -Continue to monitor at home and if SBP is consistently in the 90s or symptoms develop please give [place] a call    Dispo: Please follow-up in 4 to 6 months with Dr. Clifton James Please follow-up with atrial fibrillation clinic for further titration of Tikosyn and review of zio monitor  Signed, Sharlene Dory, PA-C

## 2023-03-10 NOTE — Patient Instructions (Signed)
Medication Instructions:    Your physician recommends that you continue on your current medications as directed. Please refer to the Current Medication list given to you today.   *If you need a refill on your cardiac medications before your next appointment, please call your pharmacy*    Lab Work: NONE ORDERED  TODAY     If you have labs (blood work) drawn today and your tests are completely normal, you will receive your results only by: MyChart Message (if you have MyChart) OR A paper copy in the mail If you have any lab test that is abnormal or we need to change your treatment, we will call you to review the results.   Testing/Procedures: NONE ORDERED  TODAY      Follow-Up: At [facility], you and your health needs are our priority.  As part of our continuing mission to provide you with exceptional heart care, we have created designated Provider Care Teams.  These Care Teams include your primary Cardiologist (physician) and Advanced Practice Providers (APPs -  Physician Assistants and Nurse Practitioners) who all work together to provide you with the care you need, when you need it.  We recommend signing up for the patient portal called "MyChart".  Sign up information is provided on this After Visit Summary.  MyChart is used to connect with patients for Virtual Visits (Telemedicine).  Patients are able to view lab/test results, encounter notes, upcoming appointments, etc.  Non-urgent messages can be sent to your provider as well.   To learn more about what you can do with MyChart, go to [email or web address].    Your next appointment:   4-6 month(s)  Provider:   Verne Carrow, MD    Other Instructions

## 2023-03-11 ENCOUNTER — Ambulatory Visit (INDEPENDENT_AMBULATORY_CARE_PROVIDER_SITE_OTHER): Payer: Medicare Other

## 2023-03-11 VITALS — Ht 74.0 in | Wt 232.0 lb

## 2023-03-11 DIAGNOSIS — Z0000 Encounter for general adult medical examination without abnormal findings: Secondary | ICD-10-CM

## 2023-03-11 NOTE — Patient Instructions (Signed)
[name] , Thank you for taking time to come for your Medicare Wellness Visit. I appreciate your ongoing commitment to your health goals. Please review the following plan we discussed and let me know if I can assist you in the future.   These are the goals we discussed:  Goals      Prevent falls        This is a list of the screening recommended for you and due dates:  Health Maintenance  Topic Date Due   Complete foot exam   Never done   Eye exam for diabetics  Never done   Yearly kidney health urinalysis for diabetes  Never done   Hepatitis C Screening  Never done   Zoster (Shingles) Vaccine (1 of 2) Never done   DTaP/Tdap/Td vaccine (2 - Tdap) 12/22/2015   COVID-19 Vaccine (4 - 2023-24 season) 05/28/2022   Flu Shot  04/28/2023   Hemoglobin A1C  08/21/2023   Yearly kidney function blood test for diabetes  02/23/2024   Medicare Annual Wellness Visit  03/10/2024   Pneumonia Vaccine  Completed   HPV Vaccine  Aged Out   Colon Cancer Screening  Discontinued    Advanced directives: Information on Advanced Care Planning can be found at [facility] Advance Health Care Directives Advance Health Care Directives ([email or web address] Please bring a copy of your health care power of attorney and living will to the office to be added to your chart at your convenience.  Conditions/risks identified: Aim for 30 minutes of exercise or brisk walking, 6-8 glasses of water, and 5 servings of fruits and vegetables each day.  Next appointment: Follow up in one year for your annual wellness visit.   Preventive Care 80 Years and Older, Male  Preventive care refers to lifestyle choices and visits with your health care provider that can promote health and wellness. What does preventive care include? A yearly physical exam. This is also called an annual well check. Dental exams once or twice a year. Routine eye exams. Ask your health care provider how often you should have your eyes  checked. Personal lifestyle choices, including: Daily care of your teeth and gums. Regular physical activity. Eating a healthy diet. Avoiding tobacco and drug use. Limiting alcohol use. Practicing safe sex. Taking low doses of aspirin every day. Taking vitamin and mineral supplements as recommended by your health care provider. What happens during an annual well check? The services and screenings done by your health care provider during your annual well check will depend on your age, overall health, lifestyle risk factors, and family history of disease. Counseling  Your health care provider may ask you questions about your: Alcohol use. Tobacco use. Drug use. Emotional well-being. Home and relationship well-being. Sexual activity. Eating habits. History of falls. Memory and ability to understand (cognition). Work and work Astronomer. Screening  You may have the following tests or measurements: Height, weight, and BMI. Blood pressure. Lipid and cholesterol levels. These may be checked every 5 years, or more frequently if you are over 53 years old. Skin check. Lung cancer screening. You may have this screening every year starting at age 29 if you have a 30-pack-year history of smoking and currently smoke or have quit within the past 15 years. Fecal occult blood test (FOBT) of the stool. You may have this test every year starting at age 36. Flexible sigmoidoscopy or colonoscopy. You may have a sigmoidoscopy every 5 years or a colonoscopy every 10 years starting  at age 73. Prostate cancer screening. Recommendations will vary depending on your family history and other risks. Hepatitis C blood test. Hepatitis B blood test. Sexually transmitted disease (STD) testing. Diabetes screening. This is done by checking your blood sugar (glucose) after you have not eaten for a while (fasting). You may have this done every 1-3 years. Abdominal aortic aneurysm (AAA) screening. You may need this  if you are a current or former smoker. Osteoporosis. You may be screened starting at age 90 if you are at high risk. Talk with your health care provider about your test results, treatment options, and if necessary, the need for more tests. Vaccines  Your health care provider may recommend certain vaccines, such as: Influenza vaccine. This is recommended every year. Tetanus, diphtheria, and acellular pertussis (Tdap, Td) vaccine. You may need a Td booster every 10 years. Zoster vaccine. You may need this after age 70. Pneumococcal 13-valent conjugate (PCV13) vaccine. One dose is recommended after age 10. Pneumococcal polysaccharide (PPSV23) vaccine. One dose is recommended after age 89. Talk to your health care provider about which screenings and vaccines you need and how often you need them. This information is not intended to replace advice given to you by your health care provider. Make sure you discuss any questions you have with your health care provider. Document Released: 10/10/2015 Document Revised: 06/02/2016 Document Reviewed: 07/15/2015 Elsevier Interactive Patient Education  2017 [place] Prevention in the Home Falls can cause injuries. They can happen to people of all ages. There are many things you can do to make your home safe and to help prevent falls. What can I do on the outside of my home? Regularly fix the edges of walkways and driveways and fix any cracks. Remove anything that might make you trip as you walk through a door, such as a raised step or threshold. Trim any bushes or trees on the path to your home. Use bright outdoor lighting. Clear any walking paths of anything that might make someone trip, such as rocks or tools. Regularly check to see if handrails are loose or broken. Make sure that both sides of any steps have handrails. Any raised decks and porches should have guardrails on the edges. Have any leaves, snow, or ice cleared regularly. Use sand  or salt on walking paths during winter. Clean up any spills in your garage right away. This includes oil or grease spills. What can I do in the bathroom? Use night lights. Install grab bars by the toilet and in the tub and shower. Do not use towel bars as grab bars. Use non-skid mats or decals in the tub or shower. If you need to sit down in the shower, use a plastic, non-slip stool. Keep the floor dry. Clean up any water that spills on the floor as soon as it happens. Remove soap buildup in the tub or shower regularly. Attach bath mats securely with double-sided non-slip rug tape. Do not have throw rugs and other things on the floor that can make you trip. What can I do in the bedroom? Use night lights. Make sure that you have a light by your bed that is easy to reach. Do not use any sheets or blankets that are too big for your bed. They should not hang down onto the floor. Have a firm chair that has side arms. You can use this for support while you get dressed. Do not have throw rugs and other things on the floor that can  make you trip. What can I do in the kitchen? Clean up any spills right away. Avoid walking on wet floors. Keep items that you use a lot in easy-to-reach places. If you need to reach something above you, use a strong step stool that has a grab bar. Keep electrical cords out of the way. Do not use floor polish or wax that makes floors slippery. If you must use wax, use non-skid floor wax. Do not have throw rugs and other things on the floor that can make you trip. What can I do with my stairs? Do not leave any items on the stairs. Make sure that there are handrails on both sides of the stairs and use them. Fix handrails that are broken or loose. Make sure that handrails are as long as the stairways. Check any carpeting to make sure that it is firmly attached to the stairs. Fix any carpet that is loose or worn. Avoid having throw rugs at the top or bottom of the stairs.  If you do have throw rugs, attach them to the floor with carpet tape. Make sure that you have a light switch at the top of the stairs and the bottom of the stairs. If you do not have them, ask someone to add them for you. What else can I do to help prevent falls? Wear shoes that: Do not have high heels. Have rubber bottoms. Are comfortable and fit you well. Are closed at the toe. Do not wear sandals. If you use a stepladder: Make sure that it is fully opened. Do not climb a closed stepladder. Make sure that both sides of the stepladder are locked into place. Ask someone to hold it for you, if possible. Clearly mark and make sure that you can see: Any grab bars or handrails. First and last steps. Where the edge of each step is. Use tools that help you move around (mobility aids) if they are needed. These include: Canes. Walkers. Scooters. Crutches. Turn on the lights when you go into a dark area. Replace any light bulbs as soon as they burn out. Set up your furniture so you have a clear path. Avoid moving your furniture around. If any of your floors are uneven, fix them. If there are any pets around you, be aware of where they are. Review your medicines with your doctor. Some medicines can make you feel dizzy. This can increase your chance of falling. Ask your doctor what other things that you can do to help prevent falls. This information is not intended to replace advice given to you by your health care provider. Make sure you discuss any questions you have with your health care provider. Document Released: 07/10/2009 Document Revised: 02/19/2016 Document Reviewed: 10/18/2014 Elsevier Interactive Patient Education  2017 [employer].

## 2023-03-11 NOTE — Progress Notes (Cosign Needed)
Subjective:   Kevin [name] is a 82 y.o. male who presents for Medicare Annual/Subsequent preventive examination.  I connected with  Kevin [name] on 03/11/23 by a audio enabled telemedicine application and verified that I am speaking with the correct person using two identifiers.  Patient Location: Home  Provider Location: Home Office  I discussed the limitations of evaluation and management by telemedicine. The patient expressed understanding and agreed to proceed.  Review of Systems     Cardiac Risk Factors include: advanced age (>71men, >23 women);diabetes mellitus;dyslipidemia;hypertension;male gender;sedentary lifestyle     Objective:    Today's Vitals   03/11/23 1355  Weight: 232 lb (105.2 kg)  Height: 6\' 2"  (1.88 m)   Body mass index is 29.79 kg/m.     03/11/2023    2:18 PM 02/08/2023    4:00 PM 01/20/2023    2:58 PM 12/07/2022    9:38 PM 12/02/2022    8:01 AM 08/21/2019    9:48 AM 08/19/2019    3:36 PM  Advanced Directives  Does Patient Have a Medical Advance Directive? No Yes No No;Yes No No No  Type of Advance Directive  Healthcare Power of Attorney  Living will;Healthcare Power of Attorney     Does patient want to make changes to medical advance directive?  No - Patient declined       Copy of Healthcare Power of Attorney in Chart?  No - copy requested  No - copy requested, Physician notified     Would patient like information on creating a medical advance directive? Yes (MAU/Ambulatory/Procedural Areas - Information given) No - Patient declined No - Patient declined   No - Patient declined     Current Medications (verified) Outpatient Encounter Medications as of 03/11/2023  Medication Sig   acetaminophen (TYLENOL) 500 MG tablet Take 1,000 mg by mouth at bedtime as needed (pain.).   albuterol (VENTOLIN HFA) 108 (90 Base) MCG/ACT inhaler Inhale 2 puffs into the lungs every 6 (six) hours as needed for wheezing or shortness of breath.   apixaban  (ELIQUIS) 5 MG TABS tablet Take 1 tablet (5 mg total) by mouth 2 (two) times daily. Okay to restart this medication with your evening dose on 01/04/2023   clonazePAM (KLONOPIN) 0.5 MG tablet Take 1 tablet (0.5 mg total) by mouth 3 (three) times daily as needed for anxiety.   Continuous Glucose Sensor (FREESTYLE LIBRE 3 SENSOR) MISC as directed.   Dextromethorphan HBr (DELSYM PO) Take 10 mLs by mouth 2 (two) times daily.   diltiazem (CARDIZEM CD) 120 MG 24 hr capsule Take 1 capsule (120 mg total) by mouth daily. Take at breakfast.   dofetilide (TIKOSYN) 250 MCG capsule Take 1 capsule (250 mcg total) by mouth 2 (two) times daily.   guaiFENesin (MUCINEX) 600 MG 12 hr tablet Take 600 mg by mouth 2 (two) times daily.   insulin aspart protamine - aspart (NOVOLOG 70/30 MIX) (70-30) 100 UNIT/ML FlexPen Inject 27 Units into the skin 2 (two) times daily with a meal.   Insulin Pen Needle (PEN NEEDLES 3/16") 31G X 5 MM MISC 1 Pen by Does not apply route 2 (two) times daily before a meal. Before breakfast and dinner   loratadine (CLARITIN) 10 MG tablet Take 10 mg by mouth at bedtime.   melatonin 3 MG TABS tablet Take 6 mg by mouth at bedtime.   mesalamine (LIALDA) 1.2 g EC tablet Take 1.2 g by mouth in the morning.   metoprolol succinate (TOPROL-XL) 50 MG  24 hr tablet Take 1 tablet (50 mg total) by mouth every evening.   Multiple Vitamins-Minerals (PRESERVISION AREDS 2 PO) Take 1 capsule by mouth 2 (two) times daily.   mupirocin cream (BACTROBAN) 2 % Apply 1 Application topically 2 (two) times daily.   nitrofurantoin, macrocrystal-monohydrate, (MACROBID) 100 MG capsule Take 100 mg by mouth at bedtime.   oxybutynin (DITROPAN) 5 MG tablet Take 5 mg by mouth daily.   rosuvastatin (CRESTOR) 10 MG tablet Take 0.5 tablets (5 mg total) by mouth daily.   senna-docusate (SENOKOT-S) 8.6-50 MG tablet Take 1 tablet by mouth at bedtime. (Patient taking differently: Take 1 tablet by mouth at bedtime as needed (constipation.).)    tamsulosin (FLOMAX) 0.4 MG CAPS capsule Take 1 capsule (0.4 mg total) by mouth 2 times daily at 12 noon and 4 pm. (Patient taking differently: Take 0.4 mg by mouth in the morning and at bedtime.)   Tiotropium Bromide-Olodaterol (STIOLTO RESPIMAT) 2.5-2.5 MCG/ACT AERS Inhale 2 puffs into the lungs daily.   No facility-administered encounter medications on file as of 03/11/2023.    Allergies (verified) Effexor [venlafaxine] and Other   History: Past Medical History:  Diagnosis Date   A-fib (HCC) 03/20/2018   Acute blood loss anemia    Anxiety    Benign prostatic hyperplasia with urinary retention    Cerebrovascular accident (CVA) (HCC)    CHF (congestive heart failure) (HCC)    COPD (chronic obstructive pulmonary disease) (HCC)    Diabetes mellitus type 2 in nonobese (HCC)    Dyspnea    Dysrhythmia    A-Fib   Essential hypertension 03/20/2018   Former tobacco use    H/O ulcerative colitis 1993   Leukocytosis    Lung cancer (HCC)    Osteomyelitis (HCC)    a. 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   PAF (paroxysmal atrial fibrillation) (HCC)    a. dx 03/2018 in setting of stroke, osteomyelitis.   Stage 3 chronic kidney disease (HCC)    pt/family unaware   Status post transmetatarsal amputation of left foot [facility])    Umbilical hernia    Past Surgical History:  Procedure Laterality Date   AMPUTATION Left 03/24/2018   Procedure: Transmetatarsal Amputation Left Foot;  Surgeon: Nadara Mustard, MD;  Location: [facility] OR;  Service: Orthopedics;  Laterality: Left;   BRONCHIAL BIOPSY  01/03/2023   Procedure: BRONCHIAL BIOPSIES;  Surgeon: Leslye Peer, MD;  Location: [facility] ENDOSCOPY;  Service: Pulmonary;;   BRONCHIAL BRUSHINGS  01/03/2023   Procedure: BRONCHIAL BRUSHINGS;  Surgeon: Leslye Peer, MD;  Location: [facility] ENDOSCOPY;  Service: Pulmonary;;   BRONCHIAL NEEDLE ASPIRATION BIOPSY  01/03/2023   Procedure: BRONCHIAL NEEDLE ASPIRATION BIOPSIES;  Surgeon: Leslye Peer, MD;  Location: [facility]  ENDOSCOPY;  Service: Pulmonary;;   CARDIOVERSION N/A 05/05/2018   Procedure: CARDIOVERSION;  Surgeon: Chilton Si, MD;  Location: [facility] ENDOSCOPY;  Service: Cardiovascular;  Laterality: N/A;   CARDIOVERSION N/A 12/02/2022   Procedure: CARDIOVERSION;  Surgeon: Jodelle Red, MD;  Location: [facility] ENDOSCOPY;  Service: Cardiovascular;  Laterality: N/A;   COLONOSCOPY  12/2017   benign, remission from ulcerative colitis in 90s   FIDUCIAL MARKER PLACEMENT  01/03/2023   Procedure: FIDUCIAL MARKER PLACEMENT;  Surgeon: Leslye Peer, MD;  Location: [facility] ENDOSCOPY;  Service: Pulmonary;;   I & D EXTREMITY Left 03/17/2018   Procedure: IRRIGATION AND DEBRIDEMENT FOOT;  Surgeon: Yolonda Kida, MD;  Location: [facility] OR;  Service: Orthopedics;  Laterality: Left;   STUMP REVISION Left 05/10/2018   Procedure: REVISION  LEFT TRANSMETATARSAL AMPUTATION;  Surgeon: Nadara Mustard, MD;  Location: [facility] OR;  Service: Orthopedics;  Laterality: Left;   VIDEO BRONCHOSCOPY WITH RADIAL ENDOBRONCHIAL ULTRASOUND  01/03/2023   Procedure: VIDEO BRONCHOSCOPY WITH RADIAL ENDOBRONCHIAL ULTRASOUND;  Surgeon: Leslye Peer, MD;  Location: [facility] ENDOSCOPY;  Service: Pulmonary;;   Family History  Problem Relation Age of Onset   Hypertension Father    Social History   Socioeconomic History   Marital status: Married    Spouse name: Not on file   Number of children: Not on file   Years of education: Not on file   Highest education level: Bachelor's degree (e.g., BA, AB, BS)  Occupational History   Not on file  Tobacco Use   Smoking status: Former    Packs/day: 1.00    Years: 20.00    Additional pack years: 0.00    Total pack years: 20.00    Types: Cigarettes    Start date: 1963    Quit date: 1985    Years since quitting: 39.4   Smokeless tobacco: Never   Tobacco comments:    Former smoker 40 yrs ago  Vaping Use   Vaping Use: Never used  Substance and Sexual Activity   Alcohol use: Yes    Alcohol/week: 6.0 standard  drinks of alcohol    Types: 6 Cans of beer per week    Comment: 6 pack every weekend 01/26/23   Drug use: Never   Sexual activity: Not on file  Other Topics Concern   Not on file  Social History Narrative   Lives at home with his wife and two cats. Married for almost 45 years   Right handed   Caffeine: 1 cup every morning   Social Determinants of Health   Financial Resource Strain: Low Risk  (03/11/2023)   Overall Financial Resource Strain (CARDIA)    Difficulty of Paying Living Expenses: Not hard at all  Food Insecurity: No Food Insecurity (03/11/2023)   Hunger Vital Sign    Worried About Running Out of Food in the Last Year: Never true    Ran Out of Food in the Last Year: Never true  Transportation Needs: No Transportation Needs (03/11/2023)   PRAPARE - Administrator, Civil Service (Medical): No    Lack of Transportation (Non-Medical): No  Physical Activity: Inactive (03/11/2023)   Exercise Vital Sign    Days of Exercise per Week: 0 days    Minutes of Exercise per Session: 0 min  Stress: Stress Concern Present (03/11/2023)   [employer] of Occupational Health - Occupational Stress Questionnaire    Feeling of Stress : To some extent  Social Connections: Moderately Isolated (03/11/2023)   Social Connection and Isolation Panel [NHANES]    Frequency of Communication with Friends and Family: More than three times a week    Frequency of Social Gatherings with Friends and Family: Three times a week    Attends Religious Services: Never    Active Member of Clubs or Organizations: No    Attends Banker Meetings: Never    Marital Status: Married    Tobacco Counseling Counseling given: Not Answered Tobacco comments: Former smoker 40 yrs ago   Clinical Intake:  Pre-visit preparation completed: Yes  Pain : No/denies pain  Diabetes: Yes CBG done?: No Did pt. bring in CBG monitor from home?: No  How often do you need to have someone help you when  you read instructions, pamphlets, or other written materials from your doctor  or pharmacy?: 1 - Never  Diabetic?Yes   Nutrition Risk Assessment:  Has the patient had any N/V/D within the last 2 months?  No  Does the patient have any non-healing wounds?  Yes  Has the patient had any unintentional weight loss or weight gain?  No   Diabetes:  Is the patient diabetic?  Yes  If diabetic, was a CBG obtained today?  No  Did the patient bring in their glucometer from home?  No  How often do you monitor your CBG's? Freestyle Libre.   Financial Strains and Diabetes Management:  Are you having any financial strains with the device, your supplies or your medication? No .  Does the patient want to be seen by Chronic Care Management for management of their diabetes?  No  Would the patient like to be referred to a Nutritionist or for Diabetic Management?  No   Diabetic Exams:  Diabetic Eye Exam: Completed records requested  Diabetic Foot Exam: Overdue, Pt has been advised about the importance in completing this exam. Pt is scheduled for diabetic foot exam on at next office visit .   Interpreter Needed?: No  Information entered by :: Kandis Fantasia LPN   Activities of Daily Living    03/11/2023    2:19 PM 02/08/2023    4:00 PM  In your present state of health, do you have any difficulty performing the following activities:  Hearing? 0 1  Vision? 0 0  Difficulty concentrating or making decisions? 0 0  Walking or climbing stairs? 0 0  Dressing or bathing? 0 0  Doing errands, shopping? 0 0  Preparing Food and eating ? N   Using the Toilet? N   In the past six months, have you accidently leaked urine? N   Do you have problems with loss of bowel control? N   Managing your Medications? N   Managing your Finances? N   Housekeeping or managing your Housekeeping? N     Patient Care Team: [name], MD as PCP - General (Family Medicine) Kathleene Hazel, MD as PCP -  Cardiology (Cardiology) Center, [facility] Delton Coombes, Les Pou, MD as Consulting Physician (Pulmonary Disease) Nadara Mustard, MD as Consulting Physician (Orthopedic Surgery) Si Gaul, MD as Consulting Physician (Oncology) Dorothy Puffer, MD as Consulting Physician (Radiation Oncology)  Indicate any recent Medical Services you may have received from other than [facility] providers in the past year (date may be approximate).     Assessment:   This is a routine wellness examination for Claron.  Hearing/Vision screen Hearing Screening - Comments:: Slight hearing loss  Vision Screening - Comments:: Wears rx glasses - up to date with routine eye exams with [facility]   Dietary issues and exercise activities discussed: Current Exercise Habits: The patient does not participate in regular exercise at present   Goals Addressed             This Visit's Progress    Prevent falls        Depression Screen    03/11/2023    2:17 PM 03/01/2023    1:56 PM 02/18/2023   10:58 AM 04/12/2018   12:59 PM  PHQ 2/9 Scores  PHQ - 2 Score 3 3 5  0  PHQ- 9 Score 16 16 15 2     Fall Risk    03/11/2023    2:15 PM 02/18/2023   10:58 AM 10/04/2019    3:22 PM 08/17/2018   10:42 AM 04/12/2018   12:59  PM  Fall Risk   Falls in the past year? 0 0 0 0 No  Number falls in past yr: 0 0     Injury with Fall? 0 0     Risk for fall due to : Impaired mobility;Impaired balance/gait      Follow up Falls prevention discussed;Education provided;Falls evaluation completed        FALL RISK PREVENTION PERTAINING TO THE HOME:  Any stairs in or around the home? No  If so, are there any without handrails? No  Home free of loose throw rugs in walkways, pet beds, electrical cords, etc? Yes  Adequate lighting in your home to reduce risk of falls? Yes   ASSISTIVE DEVICES UTILIZED TO PREVENT FALLS:  Life alert? No  Use of a cane, walker or w/c? Yes  Grab bars in the bathroom? Yes  Shower chair or bench in  shower? Yes  Elevated toilet seat or a handicapped toilet? Yes   TIMED UP AND GO:  Was the test performed? No . Telephonic visit   Cognitive Function:        03/11/2023    2:19 PM  6CIT Screen  What Year? 0 points  What month? 0 points  What time? 0 points  Count back from 20 0 points  Months in reverse 0 points  Repeat phrase 2 points  Total Score 2 points    Immunizations Immunization History  Administered Date(s) Administered   Influenza, High Dose Seasonal PF 06/12/2015, 06/29/2016, 07/25/2017, 06/27/2018, 09/11/2019, 11/26/2019, 07/05/2020   PFIZER(Purple Top)SARS-COV-2 Vaccination 12/27/2019, 01/21/2020, 08/15/2020   Pneumococcal Conjugate-13 11/28/2014   Pneumococcal Polysaccharide-23 05/08/2012   Td 12/21/2005   Zoster, Live 06/12/2015    TDAP status: Due, Education has been provided regarding the importance of this vaccine. Advised may receive this vaccine at local pharmacy or Health Dept. Aware to provide a copy of the vaccination record if obtained from local pharmacy or Health Dept. Verbalized acceptance and understanding.  Pneumococcal vaccine status: Up to date  Covid-19 vaccine status: Information provided on how to obtain vaccines.   Qualifies for Shingles Vaccine? Yes   Zostavax completed Yes   Shingrix Completed?: No.    Education has been provided regarding the importance of this vaccine. Patient has been advised to call insurance company to determine out of pocket expense if they have not yet received this vaccine. Advised may also receive vaccine at local pharmacy or Health Dept. Verbalized acceptance and understanding.  Screening Tests Health Maintenance  Topic Date Due   FOOT EXAM  Never done   OPHTHALMOLOGY EXAM  Never done   Diabetic kidney evaluation - Urine ACR  Never done   Hepatitis C Screening  Never done   Zoster Vaccines- Shingrix (1 of 2) Never done   DTaP/Tdap/Td (2 - Tdap) 12/22/2015   COVID-19 Vaccine (4 - 2023-24 season)  05/28/2022   INFLUENZA VACCINE  04/28/2023   HEMOGLOBIN A1C  08/21/2023   Diabetic kidney evaluation - eGFR measurement  02/23/2024   Medicare Annual Wellness (AWV)  03/10/2024   Pneumonia Vaccine 67+ Years old  Completed   HPV VACCINES  Aged Out   Colonoscopy  Discontinued    Health Maintenance  Health Maintenance Due  Topic Date Due   FOOT EXAM  Never done   OPHTHALMOLOGY EXAM  Never done   Diabetic kidney evaluation - Urine ACR  Never done   Hepatitis C Screening  Never done   Zoster Vaccines- Shingrix (1 of 2) Never done   DTaP/Tdap/Td (  2 - Tdap) 12/22/2015   COVID-19 Vaccine (4 - 2023-24 season) 05/28/2022    Colorectal cancer screening: No longer required.   Lung Cancer Screening: (Low Dose CT Chest recommended if Age 58-80 years, 30 pack-year currently smoking OR have quit w/in 15years.) does not qualify.   Lung Cancer Screening Referral: n/a  Additional Screening:  Hepatitis C Screening: does qualify;  Vision Screening: Recommended annual ophthalmology exams for early detection of glaucoma and other disorders of the eye. Is the patient up to date with their annual eye exam?  Yes  Who is the provider or what is the name of the office in which the patient attends annual eye exams? [facility]  If pt is not established with a provider, would they like to be referred to a provider to establish care? No .   Dental Screening: Recommended annual dental exams for proper oral hygiene  Community Resource Referral / Chronic Care Management: CRR required this visit?  No   CCM required this visit?  No      Plan:     I have personally reviewed and noted the following in the patient's chart:   Medical and social history Use of alcohol, tobacco or illicit drugs  Current medications and supplements including opioid prescriptions. Patient is not currently taking opioid prescriptions. Functional ability and status Nutritional status Physical activity Advanced  directives List of other physicians Hospitalizations, surgeries, and ER visits in previous 12 months Vitals Screenings to include cognitive, depression, and falls Referrals and appointments  In addition, I have reviewed and discussed with patient certain preventive protocols, quality metrics, and best practice recommendations. A written personalized care plan for preventive services as well as general preventive health recommendations were provided to patient.     Durwin Nora, [place]   1/61/0960   Due to this being a virtual visit, the after visit summary with patients personalized plan was offered to patient via mail or my-chart. Patient would like to access on my-chart  Nurse Notes: No concerns

## 2023-03-14 ENCOUNTER — Other Ambulatory Visit: Payer: Self-pay | Admitting: Cardiovascular Disease

## 2023-03-14 ENCOUNTER — Telehealth: Payer: Self-pay | Admitting: *Deleted

## 2023-03-14 DIAGNOSIS — I4891 Unspecified atrial fibrillation: Secondary | ICD-10-CM

## 2023-03-14 NOTE — Telephone Encounter (Signed)
Called patient to inform of CT being moved to 04/06/23- arrival time- 10:15 am @ [facility], no restrictions to test, provider to call patient with the results, lvm for a return call

## 2023-03-14 NOTE — Telephone Encounter (Signed)
RETURNED PATIENT'S WIFE'S PHONE CALL (MARIE), LVM FOR A RETURN CALL

## 2023-03-16 DIAGNOSIS — N401 Enlarged prostate with lower urinary tract symptoms: Secondary | ICD-10-CM | POA: Diagnosis not present

## 2023-03-16 DIAGNOSIS — R3914 Feeling of incomplete bladder emptying: Secondary | ICD-10-CM | POA: Diagnosis not present

## 2023-03-16 DIAGNOSIS — N3020 Other chronic cystitis without hematuria: Secondary | ICD-10-CM | POA: Diagnosis not present

## 2023-03-17 ENCOUNTER — Telehealth: Payer: Self-pay | Admitting: Physician Assistant

## 2023-03-17 ENCOUNTER — Ambulatory Visit: Payer: Medicare Other | Admitting: Physician Assistant

## 2023-03-17 NOTE — Telephone Encounter (Signed)
Called patient and patient contacts twice, left a message regarding new appointment times/dates as well as scan update.

## 2023-03-19 DIAGNOSIS — I4819 Other persistent atrial fibrillation: Secondary | ICD-10-CM | POA: Diagnosis not present

## 2023-03-21 NOTE — Addendum Note (Signed)
Encounter addended by: Shona Simpson, RN on: 03/21/2023 4:32 PM  Actions taken: Imaging Exam ended

## 2023-03-22 ENCOUNTER — Other Ambulatory Visit: Payer: Self-pay

## 2023-03-22 ENCOUNTER — Ambulatory Visit: Payer: Medicare Other | Admitting: Student

## 2023-03-22 ENCOUNTER — Encounter: Payer: Self-pay | Admitting: Student

## 2023-03-22 VITALS — BP 123/75 | HR 97

## 2023-03-22 DIAGNOSIS — Z7189 Other specified counseling: Secondary | ICD-10-CM | POA: Diagnosis not present

## 2023-03-22 DIAGNOSIS — I10 Essential (primary) hypertension: Secondary | ICD-10-CM

## 2023-03-22 DIAGNOSIS — F419 Anxiety disorder, unspecified: Secondary | ICD-10-CM

## 2023-03-22 DIAGNOSIS — Z794 Long term (current) use of insulin: Secondary | ICD-10-CM

## 2023-03-22 DIAGNOSIS — I4891 Unspecified atrial fibrillation: Secondary | ICD-10-CM | POA: Diagnosis not present

## 2023-03-22 DIAGNOSIS — E1151 Type 2 diabetes mellitus with diabetic peripheral angiopathy without gangrene: Secondary | ICD-10-CM | POA: Diagnosis not present

## 2023-03-22 NOTE — Patient Instructions (Signed)
I'm glad the Klonopin is working for you. A daytime dose every now and then would be okay. We'll see you back in 3 months.  I'll have the palliative care folks reach back out--I think having them on your team would be helpful.   [name], MD

## 2023-03-22 NOTE — Progress Notes (Signed)
    SUBJECTIVE:   CHIEF COMPLAINT / HPI:   Anxiety Follow-up Recently started on Klonopin after our last visit. Was intolerant of SNRIs and SSRIs not an option as he is on Tikosyn.   A Fib/Flutter Zio patch resulted yesterday with 100% Atrial Flutter. Next A Fib clinic visit is 04/05/23. Adherent to Eliquis, metoprolol and Tikosyn.   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  ***  ASSESSMENT/PLAN:   No problem-specific Assessment & Plan notes found for this encounter.     [name], MD [facility]

## 2023-03-23 LAB — MICROALBUMIN / CREATININE URINE RATIO
Creatinine, Urine: 46.2 mg/dL
Microalb/Creat Ratio: 487 mg/g creat — ABNORMAL HIGH (ref 0–29)
Microalbumin, Urine: 224.8 ug/mL

## 2023-03-23 NOTE — Assessment & Plan Note (Signed)
Much improved with introduction of Klonopin. He is only taking 0.5mg  at bedtime right now. Had written initial Rx for up to TID dosing. Per discussion with patient's wife today, I do think a daytime dose on an as-needed basis may be helpful. He notes that he has had "no excitement" recently, meaning no major changes to his health status. Suspect that evolving health crises may prompt/necessitate more as-needed doses of the Klonopin. As he's not having any evidence of respiratory compromise, I expect this would be safe and effective.  - Continue Klonopin 0.5mg  at bedtime and PRN

## 2023-03-23 NOTE — Assessment & Plan Note (Signed)
Regular rate and rhythm today, though I expect he is in flutter given findings from his Zio patch yesterday.  - Has cards f/u in two weeks

## 2023-03-23 NOTE — Assessment & Plan Note (Signed)
He is quite ill from multiple conditions affection multiple organ systems. Thankfully he is clinically stable and feeling well at present and while we will do our best to keep him as well as possible for as long as possible, he is high risk for rapid detioration. Discussed this with him and his wife today and they voice understanding. He would benefit from palliative involvement. There was confusion which led to them declining palliative services after our last visit. We have discussed further and they are now open to having them involved. - I will reach out to [facility] representative to get them re-engaged

## 2023-03-23 NOTE — Assessment & Plan Note (Addendum)
As expected, BP control is much improved with the introduction of Klonopin.  - Continue Klonopin - Continue diltiazem, metoprolol  - Cardiology follow-up in two weeks

## 2023-04-04 DIAGNOSIS — I10 Essential (primary) hypertension: Secondary | ICD-10-CM | POA: Diagnosis not present

## 2023-04-04 DIAGNOSIS — N183 Chronic kidney disease, stage 3 unspecified: Secondary | ICD-10-CM | POA: Diagnosis not present

## 2023-04-04 DIAGNOSIS — E1165 Type 2 diabetes mellitus with hyperglycemia: Secondary | ICD-10-CM | POA: Diagnosis not present

## 2023-04-04 DIAGNOSIS — E11649 Type 2 diabetes mellitus with hypoglycemia without coma: Secondary | ICD-10-CM | POA: Diagnosis not present

## 2023-04-05 ENCOUNTER — Ambulatory Visit (HOSPITAL_COMMUNITY): Payer: Medicare Other | Admitting: Internal Medicine

## 2023-04-06 ENCOUNTER — Other Ambulatory Visit: Payer: Self-pay

## 2023-04-06 ENCOUNTER — Other Ambulatory Visit: Payer: Self-pay | Admitting: *Deleted

## 2023-04-06 ENCOUNTER — Other Ambulatory Visit: Payer: Self-pay | Admitting: Physician Assistant

## 2023-04-06 ENCOUNTER — Inpatient Hospital Stay: Payer: Medicare Other | Attending: Physician Assistant

## 2023-04-06 ENCOUNTER — Ambulatory Visit (HOSPITAL_COMMUNITY)
Admission: RE | Admit: 2023-04-06 | Discharge: 2023-04-06 | Disposition: A | Discharge status: Home / Self Care | Payer: Medicare Other | Source: Ambulatory Visit | Attending: Physician Assistant | Admitting: Physician Assistant

## 2023-04-06 DIAGNOSIS — R918 Other nonspecific abnormal finding of lung field: Secondary | ICD-10-CM | POA: Diagnosis not present

## 2023-04-06 DIAGNOSIS — J449 Chronic obstructive pulmonary disease, unspecified: Secondary | ICD-10-CM | POA: Diagnosis not present

## 2023-04-06 DIAGNOSIS — C3412 Malignant neoplasm of upper lobe, left bronchus or lung: Secondary | ICD-10-CM | POA: Diagnosis not present

## 2023-04-06 DIAGNOSIS — Z87891 Personal history of nicotine dependence: Secondary | ICD-10-CM | POA: Insufficient documentation

## 2023-04-06 DIAGNOSIS — J9611 Chronic respiratory failure with hypoxia: Secondary | ICD-10-CM | POA: Diagnosis not present

## 2023-04-06 DIAGNOSIS — J8410 Pulmonary fibrosis, unspecified: Secondary | ICD-10-CM | POA: Diagnosis not present

## 2023-04-06 DIAGNOSIS — C3490 Malignant neoplasm of unspecified part of unspecified bronchus or lung: Secondary | ICD-10-CM | POA: Insufficient documentation

## 2023-04-06 DIAGNOSIS — E1122 Type 2 diabetes mellitus with diabetic chronic kidney disease: Secondary | ICD-10-CM | POA: Insufficient documentation

## 2023-04-06 DIAGNOSIS — I509 Heart failure, unspecified: Secondary | ICD-10-CM | POA: Diagnosis not present

## 2023-04-06 DIAGNOSIS — I129 Hypertensive chronic kidney disease with stage 1 through stage 4 chronic kidney disease, or unspecified chronic kidney disease: Secondary | ICD-10-CM | POA: Insufficient documentation

## 2023-04-06 DIAGNOSIS — N189 Chronic kidney disease, unspecified: Secondary | ICD-10-CM | POA: Insufficient documentation

## 2023-04-06 DIAGNOSIS — Z9981 Dependence on supplemental oxygen: Secondary | ICD-10-CM | POA: Diagnosis not present

## 2023-04-06 DIAGNOSIS — I4891 Unspecified atrial fibrillation: Secondary | ICD-10-CM | POA: Insufficient documentation

## 2023-04-06 DIAGNOSIS — C3411 Malignant neoplasm of upper lobe, right bronchus or lung: Secondary | ICD-10-CM

## 2023-04-06 DIAGNOSIS — J439 Emphysema, unspecified: Secondary | ICD-10-CM | POA: Diagnosis not present

## 2023-04-06 DIAGNOSIS — J90 Pleural effusion, not elsewhere classified: Secondary | ICD-10-CM | POA: Diagnosis not present

## 2023-04-06 LAB — CBC WITH DIFFERENTIAL (CANCER CENTER ONLY)
Abs Immature Granulocytes: 0.03 10*3/uL (ref 0.00–0.07)
Basophils Absolute: 0.1 10*3/uL (ref 0.0–0.1)
Basophils Relative: 1 %
Eosinophils Absolute: 0.3 10*3/uL (ref 0.0–0.5)
Eosinophils Relative: 4 %
HCT: 38.2 % — ABNORMAL LOW (ref 39.0–52.0)
Hemoglobin: 12.3 g/dL — ABNORMAL LOW (ref 13.0–17.0)
Immature Granulocytes: 0 %
Lymphocytes Relative: 14 %
Lymphs Abs: 1 10*3/uL (ref 0.7–4.0)
MCH: 30 pg (ref 26.0–34.0)
MCHC: 32.2 g/dL (ref 30.0–36.0)
MCV: 93.2 fL (ref 80.0–100.0)
Monocytes Absolute: 0.8 10*3/uL (ref 0.1–1.0)
Monocytes Relative: 11 %
Neutro Abs: 5.2 10*3/uL (ref 1.7–7.7)
Neutrophils Relative %: 70 %
Platelet Count: 273 10*3/uL (ref 150–400)
RBC: 4.1 MIL/uL — ABNORMAL LOW (ref 4.22–5.81)
RDW: 13.4 % (ref 11.5–15.5)
WBC Count: 7.4 10*3/uL (ref 4.0–10.5)
nRBC: 0 % (ref 0.0–0.2)

## 2023-04-06 LAB — CMP (CANCER CENTER ONLY)
ALT: 11 U/L (ref 0–44)
AST: 14 U/L — ABNORMAL LOW (ref 15–41)
Albumin: 3.5 g/dL (ref 3.5–5.0)
Alkaline Phosphatase: 147 U/L — ABNORMAL HIGH (ref 38–126)
Anion gap: 7 (ref 5–15)
BUN: 27 mg/dL — ABNORMAL HIGH (ref 8–23)
CO2: 26 mmol/L (ref 22–32)
Calcium: 9 mg/dL (ref 8.9–10.3)
Chloride: 104 mmol/L (ref 98–111)
Creatinine: 1.56 mg/dL — ABNORMAL HIGH (ref 0.61–1.24)
GFR, Estimated: 45 mL/min — ABNORMAL LOW (ref >60)
Glucose, Bld: 172 mg/dL — ABNORMAL HIGH (ref 70–99)
Potassium: 4.6 mmol/L (ref 3.5–5.1)
Sodium: 137 mmol/L (ref 135–145)
Total Bilirubin: 0.6 mg/dL (ref 0.3–1.2)
Total Protein: 7.9 g/dL (ref 6.5–8.1)

## 2023-04-06 LAB — MAGNESIUM: Magnesium: 1.8 mg/dL (ref 1.7–2.4)

## 2023-04-07 ENCOUNTER — Ambulatory Visit (HOSPITAL_COMMUNITY)
Admission: RE | Admit: 2023-04-07 | Discharge: 2023-04-07 | Disposition: A | Discharge status: Home / Self Care | Payer: Medicare Other | Source: Ambulatory Visit | Attending: Internal Medicine | Admitting: Internal Medicine

## 2023-04-07 VITALS — BP 112/66 | HR 74 | Ht 74.0 in | Wt 231.6 lb

## 2023-04-07 DIAGNOSIS — Z8673 Personal history of transient ischemic attack (TIA), and cerebral infarction without residual deficits: Secondary | ICD-10-CM | POA: Insufficient documentation

## 2023-04-07 DIAGNOSIS — J9610 Chronic respiratory failure, unspecified whether with hypoxia or hypercapnia: Secondary | ICD-10-CM | POA: Diagnosis not present

## 2023-04-07 DIAGNOSIS — Z8249 Family history of ischemic heart disease and other diseases of the circulatory system: Secondary | ICD-10-CM | POA: Diagnosis not present

## 2023-04-07 DIAGNOSIS — Z79899 Other long term (current) drug therapy: Secondary | ICD-10-CM | POA: Insufficient documentation

## 2023-04-07 DIAGNOSIS — J449 Chronic obstructive pulmonary disease, unspecified: Secondary | ICD-10-CM | POA: Insufficient documentation

## 2023-04-07 DIAGNOSIS — D6869 Other thrombophilia: Secondary | ICD-10-CM | POA: Diagnosis not present

## 2023-04-07 DIAGNOSIS — E1165 Type 2 diabetes mellitus with hyperglycemia: Secondary | ICD-10-CM | POA: Insufficient documentation

## 2023-04-07 DIAGNOSIS — Z9981 Dependence on supplemental oxygen: Secondary | ICD-10-CM | POA: Insufficient documentation

## 2023-04-07 DIAGNOSIS — I4819 Other persistent atrial fibrillation: Secondary | ICD-10-CM | POA: Insufficient documentation

## 2023-04-07 DIAGNOSIS — E785 Hyperlipidemia, unspecified: Secondary | ICD-10-CM | POA: Diagnosis not present

## 2023-04-07 DIAGNOSIS — I129 Hypertensive chronic kidney disease with stage 1 through stage 4 chronic kidney disease, or unspecified chronic kidney disease: Secondary | ICD-10-CM | POA: Insufficient documentation

## 2023-04-07 DIAGNOSIS — C3491 Malignant neoplasm of unspecified part of right bronchus or lung: Secondary | ICD-10-CM | POA: Diagnosis not present

## 2023-04-07 DIAGNOSIS — Z8616 Personal history of COVID-19: Secondary | ICD-10-CM | POA: Insufficient documentation

## 2023-04-07 DIAGNOSIS — I5032 Chronic diastolic (congestive) heart failure: Secondary | ICD-10-CM | POA: Diagnosis not present

## 2023-04-07 DIAGNOSIS — Z7901 Long term (current) use of anticoagulants: Secondary | ICD-10-CM | POA: Diagnosis not present

## 2023-04-07 DIAGNOSIS — N1832 Chronic kidney disease, stage 3b: Secondary | ICD-10-CM | POA: Insufficient documentation

## 2023-04-07 DIAGNOSIS — Z794 Long term (current) use of insulin: Secondary | ICD-10-CM | POA: Insufficient documentation

## 2023-04-07 DIAGNOSIS — I4892 Unspecified atrial flutter: Secondary | ICD-10-CM | POA: Diagnosis not present

## 2023-04-07 DIAGNOSIS — E1122 Type 2 diabetes mellitus with diabetic chronic kidney disease: Secondary | ICD-10-CM | POA: Diagnosis not present

## 2023-04-07 NOTE — Progress Notes (Signed)
Primary Care Physician: Kevin Amel, MD Primary Cardiologist: Dr. Clifton [name] Primary Electrophysiologist: Dr Kevin [name]  Referring Physician: Dr Kevin [name] Kevin [name] is a 82 y.o. 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 History of cardioversion in 2019. Seen by Cardiology on 11/25/22 for shortness of breath and was noted to be in Afib without RVR. He underwent DCCV on 12/02/22 with successful conversion to normal rhythm after 2 shocks. He was recently admitted 3/12-18/24 for acute on chronic respiratory failure with hypoxia in the setting of COPD and CHF exacerbation and atrial fibrillation. Patient is on Eliquis 5 mg BID for a CHADS2VASC score of 8.  On evaluation 12/27/22, he is in Afib today. Wife and patient agree that he seems to be more short of breath when he is in Afib. He uses oxygen at night. Review of records demonstrate he underwent DCCV on 3/7 and most likely went back into Afib on 3/12. He has been compliant with Eliquis and has not missed any doses. He has a scheduled bronchoscopy for RUL nodule on 4/8. He will stop taking Eliquis on 4/5 prior to procedure.   He drinks several beers on the weekend.   On follow up today 5/1, he is in Afib. 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 He is scheduled for stereotactic body radiotherapy (SBRT) for this finding on 5/20. He is more short of breath than usual when in Afib. He has not missed any doses of anticoagulation.  On follow up 5/14, patient is in Afib and here today for Tikosyn admission. He has not used benadryl in the past few days. He has not  missed any doses of anticoagulation. He is currently finishing prescription of keflex for cellulitis of lower extremity and only has 2 doses left after today's morning dose. He did transition to macrobid daily. He is currently on effexor and started it on 5/5; notes the current initial dose of 37.5 mg is not helping with anxiety. He is more short of breath as noted before while in Afib.   Follow up in the AF clinic 02/23/23. Patient is s/p dofetilide loading 5/14-5/17/24. He converted with the medication and did not require DCCV. His dose was decreased to 250 mcg as his CrCl with occasionally dip below 60 mL/min. He states that he had been feeling "great" up until this AM. He has felt more anxious and SOB today. ECG shows he is back in afib. He is on O2 nasal canula.   F/u in Afib clinic 04/07/23, he is currently in atrial flutter. Cardiac monitor worn June 2024 showed 100% atrial flutter. He missed a dose of Tikosyn by accident in June. No missed doses of Eliquis.   Today, he denies symptoms of palpitations, chest pain, orthopnea, PND, lower extremity edema, dizziness, presyncope, syncope, snoring, daytime somnolence, bleeding, or neurologic sequela. The patient is tolerating medications without difficulties and is otherwise without complaint today.    Atrial Fibrillation Risk Factors:  he does not have symptoms or diagnosis of sleep apnea. he does not have a history of rheumatic fever. he does not have a history of alcohol use. The patient does not have a history of early familial  atrial fibrillation or other arrhythmias.  he has a BMI of Body mass index is 29.74 kg/m.Marland Kitchen Filed Weights   04/07/23 1409  Weight: 105.1 kg     Family History  Problem Relation Age of Onset   Hypertension Father     Atrial Fibrillation Management history:  Previous antiarrhythmic drugs: dofetilide  Previous cardioversions: 2019, 12/02/22 Previous ablations: None Anticoagulation history: Eliquis 5 mg  BID   Past Medical History:  Diagnosis Date   A-fib (HCC) 03/20/2018   Acute blood loss anemia    Anxiety    Benign prostatic hyperplasia with urinary retention    Cerebrovascular accident (CVA) (HCC)    CHF (congestive heart failure) (HCC)    COPD (chronic obstructive pulmonary disease) (HCC)    Diabetes mellitus type 2 in nonobese (HCC)    Dyspnea    Dysrhythmia    A-Fib   Essential hypertension 03/20/2018   Former tobacco use    H/O ulcerative colitis 1993   Leukocytosis    Lung cancer (HCC)    Osteomyelitis (HCC)    a. 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   PAF (paroxysmal atrial fibrillation) (HCC)    a. dx 03/2018 in setting of stroke, osteomyelitis.   Stage 3 chronic kidney disease (HCC)    pt/family unaware   Status post transmetatarsal amputation of left foot [name])    Umbilical hernia    Past Surgical History:  Procedure Laterality Date   AMPUTATION Left 03/24/2018   Procedure: Transmetatarsal Amputation Left Foot;  Surgeon: Kevin Mustard, MD;  Location: [facility] OR;  Service: Orthopedics;  Laterality: Left;   BRONCHIAL BIOPSY  01/03/2023   Procedure: BRONCHIAL BIOPSIES;  Surgeon: Kevin Peer, MD;  Location: [facility] ENDOSCOPY;  Service: Pulmonary;;   BRONCHIAL BRUSHINGS  01/03/2023   Procedure: BRONCHIAL BRUSHINGS;  Surgeon: Kevin Peer, MD;  Location: [facility] ENDOSCOPY;  Service: Pulmonary;;   BRONCHIAL NEEDLE ASPIRATION BIOPSY  01/03/2023   Procedure: BRONCHIAL NEEDLE ASPIRATION BIOPSIES;  Surgeon: Kevin Peer, MD;  Location: [facility] ENDOSCOPY;  Service: Pulmonary;;   CARDIOVERSION N/A 05/05/2018   Procedure: CARDIOVERSION;  Surgeon: Kevin Si, MD;  Location: [facility] ENDOSCOPY;  Service: Cardiovascular;  Laterality: N/A;   CARDIOVERSION N/A 12/02/2022   Procedure: CARDIOVERSION;  Surgeon: Kevin Red, MD;  Location: [facility] ENDOSCOPY;  Service: Cardiovascular;  Laterality: N/A;   COLONOSCOPY  12/2017   benign, remission from ulcerative colitis in 90s   FIDUCIAL MARKER  PLACEMENT  01/03/2023   Procedure: FIDUCIAL MARKER PLACEMENT;  Surgeon: Kevin Peer, MD;  Location: [facility] ENDOSCOPY;  Service: Pulmonary;;   I & D EXTREMITY Left 03/17/2018   Procedure: IRRIGATION AND DEBRIDEMENT FOOT;  Surgeon: Yolonda Kida, MD;  Location: [facility] OR;  Service: Orthopedics;  Laterality: Left;   STUMP REVISION Left 05/10/2018   Procedure: REVISION LEFT TRANSMETATARSAL AMPUTATION;  Surgeon: Kevin Mustard, MD;  Location: [facility] OR;  Service: Orthopedics;  Laterality: Left;   VIDEO BRONCHOSCOPY WITH RADIAL ENDOBRONCHIAL ULTRASOUND  01/03/2023   Procedure: VIDEO BRONCHOSCOPY WITH RADIAL ENDOBRONCHIAL ULTRASOUND;  Surgeon: Kevin Peer, MD;  Location: [facility] ENDOSCOPY;  Service: Pulmonary;;    Current Outpatient Medications  Medication Sig Dispense Refill   acetaminophen (TYLENOL) 500 MG tablet Take 1,000 mg by mouth at bedtime as needed (pain.).     albuterol (VENTOLIN HFA) 108 (90 Base) MCG/ACT inhaler Inhale 2 puffs into the lungs every 6 (six) hours as needed for wheezing or shortness of breath. 24 g 1   apixaban (ELIQUIS) 5 MG TABS tablet Take 1  tablet (5 mg total) by mouth 2 (two) times daily. Okay to restart this medication with your evening dose on 01/04/2023 180 tablet 1   clonazePAM (KLONOPIN) 0.5 MG tablet Take 1 tablet (0.5 mg total) by mouth 3 (three) times daily as needed for anxiety. 60 tablet 1   Continuous Glucose Sensor (FREESTYLE LIBRE 3 SENSOR) MISC as directed.     diltiazem (CARDIZEM CD) 120 MG 24 hr capsule TAKE 1 CAPSULE BY MOUTH DAILY 90 capsule 3   dofetilide (TIKOSYN) 250 MCG capsule Take 1 capsule (250 mcg total) by mouth 2 (two) times daily. 180 capsule 1   guaiFENesin (MUCINEX) 600 MG 12 hr tablet Take 600 mg by mouth 2 (two) times daily.     insulin aspart protamine - aspart (NOVOLOG 70/30 MIX) (70-30) 100 UNIT/ML FlexPen Inject 27 Units into the skin 2 (two) times daily with a meal. 15 mL 11   Insulin Pen Needle (PEN NEEDLES 3/16") 31G X 5 MM MISC 1 Pen by  Does not apply route 2 (two) times daily before a meal. Before breakfast and dinner 100 each 1   loratadine (CLARITIN) 10 MG tablet Take 10 mg by mouth at bedtime.     mesalamine (LIALDA) 1.2 g EC tablet Take 1.2 g by mouth in the morning.     metoprolol succinate (TOPROL-XL) 50 MG 24 hr tablet Take 1 tablet (50 mg total) by mouth every evening.     Multiple Vitamins-Minerals (PRESERVISION AREDS 2 PO) Take 1 capsule by mouth 2 (two) times daily.     mupirocin cream (BACTROBAN) 2 % Apply 1 Application topically 2 (two) times daily. (Patient taking differently: Apply 1 Application topically as needed.) 15 g 0   nitrofurantoin, macrocrystal-monohydrate, (MACROBID) 100 MG capsule Take 100 mg by mouth at bedtime.     oxybutynin (DITROPAN) 5 MG tablet Take 5 mg by mouth daily.     rosuvastatin (CRESTOR) 10 MG tablet Take 0.5 tablets (5 mg total) by mouth daily. 45 tablet 1   senna-docusate (SENOKOT-S) 8.6-50 MG tablet Take 1 tablet by mouth at bedtime. (Patient taking differently: Take 1 tablet by mouth at bedtime as needed (constipation.).)     tamsulosin (FLOMAX) 0.4 MG CAPS capsule Take 1 capsule (0.4 mg total) by mouth 2 times daily at 12 noon and 4 pm. (Patient taking differently: Take 0.4 mg by mouth in the morning and at bedtime.) 60 capsule 0   Tiotropium Bromide-Olodaterol (STIOLTO RESPIMAT) 2.5-2.5 MCG/ACT AERS Inhale 2 puffs into the lungs daily. 4 g 12   umeclidinium bromide (INCRUSE ELLIPTA) 62.5 MCG/ACT AEPB Inhale 1 puff into the lungs daily.     No current facility-administered medications for this encounter.    Allergies  Allergen Reactions   Effexor [Venlafaxine] Anaphylaxis   Other     Per spouse, pt needs to avoid any abx that could potentially cause prolonged QT syndrome    ROS- All systems are reviewed and negative except as per the HPI above.  Physical Exam: Vitals:   04/07/23 1409  BP: 112/66  Pulse: 74  Weight: 105.1 kg  Height: 6\' 2"  (1.88 m)     GEN- The  patient is well appearing, alert and oriented x 3 today.   Neck - no JVD or carotid bruit noted Lungs- Clear to ausculation bilaterally, normal work of breathing Heart- Irregular rate and rhythm, no murmurs, rubs or gallops, PMI not laterally displaced Extremities- no clubbing, cyanosis, or edema Skin - no rash or ecchymosis noted   Wt  Readings from Last 3 Encounters:  04/07/23 105.1 kg  03/11/23 105.2 kg  03/10/23 105.2 kg   EKG today demonstrates  Vent. rate 74 BPM PR interval * ms QRS duration 150 ms QT/QTcB 460/510 ms P-R-T axes * 257 -88 Undetermined rhythm Right bundle branch block , plus right ventricular hypertrophy T wave abnormality, consider inferolateral ischemia Abnormal ECG When compared with ECG of 01-Mar-2023 15:36, PREVIOUS ECG IS PRESENT  Echo 12/08/22 demonstrated: 1. Left ventricular ejection fraction, by estimation, is 55 to 60%. The  left ventricle has normal function. The left ventricle has no regional  wall motion abnormalities. Left ventricular diastolic parameters are  indeterminate. There is the interventricular septum is flattened in systole, suggestive of right ventricular pressure overload.   2. Right ventricular systolic function is mildly reduced. The right  ventricular size is moderately enlarged. There is normal pulmonary artery  systolic pressure. The estimated right ventricular systolic pressure is  30.5 mmHg.   3. Left atrial size was mildly dilated.   4. Right atrial size was moderately dilated.   5. The mitral valve is grossly normal. Trivial mitral valve  regurgitation. No evidence of mitral stenosis.   6. The aortic valve is tricuspid. There is mild calcification of the  aortic valve. There is mild thickening of the aortic valve. Aortic valve  regurgitation is not visualized. No aortic stenosis is present.   7. The inferior vena cava is normal in size with greater than 50%  respiratory variability, suggesting right atrial pressure of  3 mmHg.   Cardiac monitor 02/2023: Patch Wear Time:  13 days and 20 hours    100% atrial flutter burden, ranging from 49-149 bpm (avg of 91 bpm) Less than 1% ventricular and supraventricular ectopy Patient triggered episodes associated with atrial flutter   Will Camnitz, MD  Epic records are reviewed at length today.  CHA2DS2-VASc Score = 8  The patient's score is based upon: CHF History: 1 HTN History: 1 Diabetes History: 1 Stroke History: 2 Vascular Disease History: 1 Age Score: 2 Gender Score: 0      ASSESSMENT AND PLAN: 1. Persistent Atrial Fibrillation (ICD10:  I48.0) The patient's CHA2DS2-VASc score is 8, indicating a 10.8% annual risk of stroke.   S/p dofetilide loading 5/14-5/17/24.  He is in atrial flutter. Cardiac monitor 02/2023 showed 100% atrial flutter. After discussion, we will proceed with scheduling DCCV. He just had recent lab work. He has not missed any doses of Eliquis. If he has ERAF, will consider with EP whether he is a candidate for ablation. This may depend on oncology treatment plan.   Informed Consent   Shared Decision Making/Informed Consent The risks (stroke, cardiac arrhythmias rarely resulting in the need for a temporary or permanent pacemaker, skin irritation or burns and complications associated with conscious sedation including aspiration, arrhythmia, respiratory failure and death), benefits (restoration of normal sinus rhythm) and alternatives of a direct current cardioversion were explained in detail to Kevin [name] and he agrees to proceed.      Continue dofetilide 250 mcg BID Continue diltiazem 120 mg daily Continue metoprolol 50 mg daily  2. 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  Continue Apixaban (Eliquis).  No missed doses.  3. HTN Stable, no changes today.  4. Chronic diastolic CHF Fluid status appears stable.   5.  Malignant neoplasm of right lung Wife states he has completed stereotactic radiotherapy treatment notice (5 treatments) and  plan for treatment pending chest CT results from 04/06/23.   Follow up 1-2 weeks after DCCV.   Lake Bells, PA-C Afib Clinic [facility] [address] [place], [place] [address] [phone] 04/07/2023 2:29 PM

## 2023-04-07 NOTE — H&P (View-Only) (Signed)
 Primary Care Physician: [name], MD Primary Cardiologist: Dr. Clifton James Primary Electrophysiologist: Dr Elberta Fortis  Referring Physician: Dr Deanne Coffer Kevin [name] is a 82 y.o. 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 History of cardioversion in 2019. Seen by Cardiology on 11/25/22 for shortness of breath and was noted to be in Afib without RVR. He underwent DCCV on 12/02/22 with successful conversion to normal rhythm after 2 shocks. He was recently admitted 3/12-18/24 for acute on chronic respiratory failure with hypoxia in the setting of COPD and CHF exacerbation and atrial fibrillation. Patient is on Eliquis 5 mg BID for a CHADS2VASC score of 8.  On evaluation 12/27/22, he is in Afib today. Wife and patient agree that he seems to be more short of breath when he is in Afib. He uses oxygen at night. Review of records demonstrate he underwent DCCV on 3/7 and most likely went back into Afib on 3/12. He has been compliant with Eliquis and has not missed any doses. He has a scheduled bronchoscopy for RUL nodule on 4/8. He will stop taking Eliquis on 4/5 prior to procedure.   He drinks several beers on the weekend.   On follow up today 5/1, he is in Afib. 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 He is scheduled for stereotactic body radiotherapy (SBRT) for this finding on 5/20. He is more short of breath than usual when in Afib. He has not missed any doses of anticoagulation.  On follow up 5/14, patient is in Afib and here today for Tikosyn admission. He has not used benadryl in the past few days. He has not  missed any doses of anticoagulation. He is currently finishing prescription of keflex for cellulitis of lower extremity and only has 2 doses left after today's morning dose. He did transition to macrobid daily. He is currently on effexor and started it on 5/5; notes the current initial dose of 37.5 mg is not helping with anxiety. He is more short of breath as noted before while in Afib.   Follow up in the AF clinic 02/23/23. Patient is s/p dofetilide loading 5/14-5/17/24. He converted with the medication and did not require DCCV. His dose was decreased to 250 mcg as his CrCl with occasionally dip below 60 mL/min. He states that he had been feeling "great" up until this AM. He has felt more anxious and SOB today. ECG shows he is back in afib. He is on O2 nasal canula.   F/u in Afib clinic 04/07/23, he is currently in atrial flutter. Cardiac monitor worn June 2024 showed 100% atrial flutter. He missed a dose of Tikosyn by accident in June. No missed doses of Eliquis.   Today, he denies symptoms of palpitations, chest pain, orthopnea, PND, lower extremity edema, dizziness, presyncope, syncope, snoring, daytime somnolence, bleeding, or neurologic sequela. The patient is tolerating medications without difficulties and is otherwise without complaint today.    Atrial Fibrillation Risk Factors:  he does not have symptoms or diagnosis of sleep apnea. he does not have a history of rheumatic fever. he does not have a history of alcohol use. The patient does not have a history of early familial  atrial fibrillation or other arrhythmias.  he has a BMI of Body mass index is 29.74 kg/m.Marland Kitchen Filed Weights   04/07/23 1409  Weight: 105.1 kg     Family History  Problem Relation Age of Onset   Hypertension Father     Atrial Fibrillation Management history:  Previous antiarrhythmic drugs: dofetilide  Previous cardioversions: 2019, 12/02/22 Previous ablations: None Anticoagulation history: Eliquis 5 mg  BID   Past Medical History:  Diagnosis Date   A-fib (HCC) 03/20/2018   Acute blood loss anemia    Anxiety    Benign prostatic hyperplasia with urinary retention    Cerebrovascular accident (CVA) (HCC)    CHF (congestive heart failure) (HCC)    COPD (chronic obstructive pulmonary disease) (HCC)    Diabetes mellitus type 2 in nonobese (HCC)    Dyspnea    Dysrhythmia    A-Fib   Essential hypertension 03/20/2018   Former tobacco use    H/O ulcerative colitis 1993   Leukocytosis    Lung cancer (HCC)    Osteomyelitis (HCC)    a. 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   PAF (paroxysmal atrial fibrillation) (HCC)    a. dx 03/2018 in setting of stroke, osteomyelitis.   Stage 3 chronic kidney disease (HCC)    pt/family unaware   Status post transmetatarsal amputation of left foot [name])    Umbilical hernia    Past Surgical History:  Procedure Laterality Date   AMPUTATION Left 03/24/2018   Procedure: Transmetatarsal Amputation Left Foot;  Surgeon: Nadara Mustard, MD;  Location: [facility] OR;  Service: Orthopedics;  Laterality: Left;   BRONCHIAL BIOPSY  01/03/2023   Procedure: BRONCHIAL BIOPSIES;  Surgeon: Leslye Peer, MD;  Location: [facility] ENDOSCOPY;  Service: Pulmonary;;   BRONCHIAL BRUSHINGS  01/03/2023   Procedure: BRONCHIAL BRUSHINGS;  Surgeon: Leslye Peer, MD;  Location: [facility] ENDOSCOPY;  Service: Pulmonary;;   BRONCHIAL NEEDLE ASPIRATION BIOPSY  01/03/2023   Procedure: BRONCHIAL NEEDLE ASPIRATION BIOPSIES;  Surgeon: Leslye Peer, MD;  Location: [facility] ENDOSCOPY;  Service: Pulmonary;;   CARDIOVERSION N/A 05/05/2018   Procedure: CARDIOVERSION;  Surgeon: Chilton Si, MD;  Location: [facility] ENDOSCOPY;  Service: Cardiovascular;  Laterality: N/A;   CARDIOVERSION N/A 12/02/2022   Procedure: CARDIOVERSION;  Surgeon: Jodelle Red, MD;  Location: [facility] ENDOSCOPY;  Service: Cardiovascular;  Laterality: N/A;   COLONOSCOPY  12/2017   benign, remission from ulcerative colitis in 90s   FIDUCIAL MARKER  PLACEMENT  01/03/2023   Procedure: FIDUCIAL MARKER PLACEMENT;  Surgeon: Leslye Peer, MD;  Location: [facility] ENDOSCOPY;  Service: Pulmonary;;   I & D EXTREMITY Left 03/17/2018   Procedure: IRRIGATION AND DEBRIDEMENT FOOT;  Surgeon: Yolonda Kida, MD;  Location: [facility] OR;  Service: Orthopedics;  Laterality: Left;   STUMP REVISION Left 05/10/2018   Procedure: REVISION LEFT TRANSMETATARSAL AMPUTATION;  Surgeon: Nadara Mustard, MD;  Location: [facility] OR;  Service: Orthopedics;  Laterality: Left;   VIDEO BRONCHOSCOPY WITH RADIAL ENDOBRONCHIAL ULTRASOUND  01/03/2023   Procedure: VIDEO BRONCHOSCOPY WITH RADIAL ENDOBRONCHIAL ULTRASOUND;  Surgeon: Leslye Peer, MD;  Location: [facility] ENDOSCOPY;  Service: Pulmonary;;    Current Outpatient Medications  Medication Sig Dispense Refill   acetaminophen (TYLENOL) 500 MG tablet Take 1,000 mg by mouth at bedtime as needed (pain.).     albuterol (VENTOLIN HFA) 108 (90 Base) MCG/ACT inhaler Inhale 2 puffs into the lungs every 6 (six) hours as needed for wheezing or shortness of breath. 24 g 1   apixaban (ELIQUIS) 5 MG TABS tablet Take 1  tablet (5 mg total) by mouth 2 (two) times daily. Okay to restart this medication with your evening dose on 01/04/2023 180 tablet 1   clonazePAM (KLONOPIN) 0.5 MG tablet Take 1 tablet (0.5 mg total) by mouth 3 (three) times daily as needed for anxiety. 60 tablet 1   Continuous Glucose Sensor (FREESTYLE LIBRE 3 SENSOR) MISC as directed.     diltiazem (CARDIZEM CD) 120 MG 24 hr capsule TAKE 1 CAPSULE BY MOUTH DAILY 90 capsule 3   dofetilide (TIKOSYN) 250 MCG capsule Take 1 capsule (250 mcg total) by mouth 2 (two) times daily. 180 capsule 1   guaiFENesin (MUCINEX) 600 MG 12 hr tablet Take 600 mg by mouth 2 (two) times daily.     insulin aspart protamine - aspart (NOVOLOG 70/30 MIX) (70-30) 100 UNIT/ML FlexPen Inject 27 Units into the skin 2 (two) times daily with a meal. 15 mL 11   Insulin Pen Needle (PEN NEEDLES 3/16") 31G X 5 MM MISC 1 Pen by  Does not apply route 2 (two) times daily before a meal. Before breakfast and dinner 100 each 1   loratadine (CLARITIN) 10 MG tablet Take 10 mg by mouth at bedtime.     mesalamine (LIALDA) 1.2 g EC tablet Take 1.2 g by mouth in the morning.     metoprolol succinate (TOPROL-XL) 50 MG 24 hr tablet Take 1 tablet (50 mg total) by mouth every evening.     Multiple Vitamins-Minerals (PRESERVISION AREDS 2 PO) Take 1 capsule by mouth 2 (two) times daily.     mupirocin cream (BACTROBAN) 2 % Apply 1 Application topically 2 (two) times daily. (Patient taking differently: Apply 1 Application topically as needed.) 15 g 0   nitrofurantoin, macrocrystal-monohydrate, (MACROBID) 100 MG capsule Take 100 mg by mouth at bedtime.     oxybutynin (DITROPAN) 5 MG tablet Take 5 mg by mouth daily.     rosuvastatin (CRESTOR) 10 MG tablet Take 0.5 tablets (5 mg total) by mouth daily. 45 tablet 1   senna-docusate (SENOKOT-S) 8.6-50 MG tablet Take 1 tablet by mouth at bedtime. (Patient taking differently: Take 1 tablet by mouth at bedtime as needed (constipation.).)     tamsulosin (FLOMAX) 0.4 MG CAPS capsule Take 1 capsule (0.4 mg total) by mouth 2 times daily at 12 noon and 4 pm. (Patient taking differently: Take 0.4 mg by mouth in the morning and at bedtime.) 60 capsule 0   Tiotropium Bromide-Olodaterol (STIOLTO RESPIMAT) 2.5-2.5 MCG/ACT AERS Inhale 2 puffs into the lungs daily. 4 g 12   umeclidinium bromide (INCRUSE ELLIPTA) 62.5 MCG/ACT AEPB Inhale 1 puff into the lungs daily.     No current facility-administered medications for this encounter.    Allergies  Allergen Reactions   Effexor [Venlafaxine] Anaphylaxis   Other     Per spouse, pt needs to avoid any abx that could potentially cause prolonged QT syndrome    ROS- All systems are reviewed and negative except as per the HPI above.  Physical Exam: Vitals:   04/07/23 1409  BP: 112/66  Pulse: 74  Weight: 105.1 kg  Height: 6\' 2"  (1.88 m)     GEN- The  patient is well appearing, alert and oriented x 3 today.   Neck - no JVD or carotid bruit noted Lungs- Clear to ausculation bilaterally, normal work of breathing Heart- Irregular rate and rhythm, no murmurs, rubs or gallops, PMI not laterally displaced Extremities- no clubbing, cyanosis, or edema Skin - no rash or ecchymosis noted   Wt  Readings from Last 3 Encounters:  04/07/23 105.1 kg  03/11/23 105.2 kg  03/10/23 105.2 kg   EKG today demonstrates  Vent. rate 74 BPM PR interval * ms QRS duration 150 ms QT/QTcB 460/510 ms P-R-T axes * 257 -88 Undetermined rhythm Right bundle branch block , plus right ventricular hypertrophy T wave abnormality, consider inferolateral ischemia Abnormal ECG When compared with ECG of 01-Mar-2023 15:36, PREVIOUS ECG IS PRESENT  Echo 12/08/22 demonstrated: 1. Left ventricular ejection fraction, by estimation, is 55 to 60%. The  left ventricle has normal function. The left ventricle has no regional  wall motion abnormalities. Left ventricular diastolic parameters are  indeterminate. There is the interventricular septum is flattened in systole, suggestive of right ventricular pressure overload.   2. Right ventricular systolic function is mildly reduced. The right  ventricular size is moderately enlarged. There is normal pulmonary artery  systolic pressure. The estimated right ventricular systolic pressure is  30.5 mmHg.   3. Left atrial size was mildly dilated.   4. Right atrial size was moderately dilated.   5. The mitral valve is grossly normal. Trivial mitral valve  regurgitation. No evidence of mitral stenosis.   6. The aortic valve is tricuspid. There is mild calcification of the  aortic valve. There is mild thickening of the aortic valve. Aortic valve  regurgitation is not visualized. No aortic stenosis is present.   7. The inferior vena cava is normal in size with greater than 50%  respiratory variability, suggesting right atrial pressure of  3 mmHg.   Cardiac monitor 02/2023: Patch Wear Time:  13 days and 20 hours    100% atrial flutter burden, ranging from 49-149 bpm (avg of 91 bpm) Less than 1% ventricular and supraventricular ectopy Patient triggered episodes associated with atrial flutter   Will Camnitz, MD  Epic records are reviewed at length today.  CHA2DS2-VASc Score = 8  The patient's score is based upon: CHF History: 1 HTN History: 1 Diabetes History: 1 Stroke History: 2 Vascular Disease History: 1 Age Score: 2 Gender Score: 0      ASSESSMENT AND PLAN: 1. Persistent Atrial Fibrillation (ICD10:  I48.0) The patient's CHA2DS2-VASc score is 8, indicating a 10.8% annual risk of stroke.   S/p dofetilide loading 5/14-5/17/24.  He is in atrial flutter. Cardiac monitor 02/2023 showed 100% atrial flutter. After discussion, we will proceed with scheduling DCCV. He just had recent lab work. He has not missed any doses of Eliquis. If he has ERAF, will consider with EP whether he is a candidate for ablation. This may depend on oncology treatment plan.   Informed Consent   Shared Decision Making/Informed Consent The risks (stroke, cardiac arrhythmias rarely resulting in the need for a temporary or permanent pacemaker, skin irritation or burns and complications associated with conscious sedation including aspiration, arrhythmia, respiratory failure and death), benefits (restoration of normal sinus rhythm) and alternatives of a direct current cardioversion were explained in detail to Mr. Petter and he agrees to proceed.      Continue dofetilide 250 mcg BID Continue diltiazem 120 mg daily Continue metoprolol 50 mg daily  2. 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  Continue Apixaban (Eliquis).  No missed doses.  3. HTN Stable, no changes today.  4. Chronic diastolic CHF Fluid status appears stable.   5.  Malignant neoplasm of right lung Wife states he has completed stereotactic radiotherapy treatment notice (5 treatments) and  plan for treatment pending chest CT results from 04/06/23.   Follow up 1-2 weeks after DCCV.   Lake Bells, PA-C Afib Clinic [facility] [address] [place], [place] [address] [phone] 04/07/2023 2:29 PM

## 2023-04-07 NOTE — Patient Instructions (Addendum)
Cardioversion scheduled for: Tuesday, July 16th   - Arrive at the [facility] and go to admitting at 830am   - Do not eat or drink anything after midnight the night prior to your procedure.   - Take all your morning medication (except diabetic medications) with a sip of water prior to arrival.  - You will not be able to drive home after your procedure.    - Do NOT miss any doses of your blood thinner - if you should miss a dose please notify our office immediately.   - If you feel as if you go back into normal rhythm prior to scheduled cardioversion, please notify our office immediately.   If your procedure is canceled in the cardioversion suite you will be charged a cancellation fee.

## 2023-04-08 ENCOUNTER — Telehealth: Payer: Self-pay

## 2023-04-08 NOTE — Telephone Encounter (Signed)
ATC LVMTCB  Front desk please schedule pt in first available non held OV slot. Pt needs to be re-qualified for O2.

## 2023-04-10 NOTE — Progress Notes (Signed)
[name] OFFICE PROGRESS NOTE  Kevin Amel, MD [address] 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  The patient presented with a right upper lobe and left upper lobe pulmonary nodules.  He also has some smaller pulmonary nodules which will need to be monitored closely on future imaging studies.  He was diagnosed in April 2024.   PDL1: 0%  Foundation One: No actionable mutations   PRIOR THERAPY: SBRT to the RUL and LUL under the care of Dr. Mitzi Hansen, completed on 02/28/23  CURRENT THERAPY: Observation   INTERVAL HISTORY: Kevin [name] 82 y.o. male returns to the clinic today for a follow-up visit accompanied by his wife. The patient first established in the clinic on 01/11/2023.  The patient was referred for what is felt to be due to stage Ia primary lung cancers in the right upper lobe and left upper lobe.  Although, the patient does have several other pulmonary nodules which will need to be monitored closely.  The patient was referred to radiation oncology for SBRT which was completed in early June 2024.  Radiation oncology recommended close interval follow-up on the left lower lobe lesion.  Overall, the patient has multiple medical problems such as COPD/chronic hypoxic respiratory failure for which she is on 3 L of supplemental oxygen.  He also has congestive heart failure, atrial fibrillation, partial amputation of the left foot, diabetes, strokes, etc.  He denies any new complaints today.  He continues to have similar shortness of breath secondary to his A-fib and COPD. He is scheduled for a cardioversion tomorrow. He denies any fever, chills, night sweats, or unexplained weight loss.  Denies any chest pain.  He has a chronic cough secondary to postnasal drainage which has been going on for several years. If anything, he thinks his cough is a little better than it was.  Denies any hemoptysis.   Denies any nausea, vomiting, diarrhea, or constipation.  Denies any headache or visual changes.  He recently had a restaging CT scan performed.  He is here today for evaluation and to review his scan results.     MEDICAL HISTORY: Past Medical History:  Diagnosis Date   A-fib (HCC) 03/20/2018   Acute blood loss anemia    Anxiety    Benign prostatic hyperplasia with urinary retention    Cerebrovascular accident (CVA) (HCC)    CHF (congestive heart failure) (HCC)    COPD (chronic obstructive pulmonary disease) (HCC)    Diabetes mellitus type 2 in nonobese (HCC)    Dyspnea    Dysrhythmia    A-Fib   Essential hypertension 03/20/2018   Former tobacco use    H/O ulcerative colitis 1993   Leukocytosis    Lung cancer (HCC)    Osteomyelitis (HCC)    a. 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   PAF (paroxysmal atrial fibrillation) (HCC)    a. dx 03/2018 in setting of stroke, osteomyelitis.   Stage 3 chronic kidney disease (HCC)    pt/family unaware   Status post transmetatarsal amputation of left foot (HCC)    Umbilical hernia     ALLERGIES:  is allergic to effexor [venlafaxine], lipitor [atorvastatin], and other.  MEDICATIONS:  Current Outpatient Medications  Medication Sig Dispense Refill   acetaminophen (TYLENOL) 500 MG tablet Take 1,000 mg by mouth at bedtime as needed (pain.).     albuterol (VENTOLIN HFA) 108 (90 Base) MCG/ACT inhaler Inhale 2 puffs into the lungs every  6 (six) hours as needed for wheezing or shortness of breath. 24 g 1   apixaban (ELIQUIS) 5 MG TABS tablet Take 1 tablet (5 mg total) by mouth 2 (two) times daily. Okay to restart this medication with your evening dose on 01/04/2023 180 tablet 1   clonazePAM (KLONOPIN) 0.5 MG tablet Take 1 tablet (0.5 mg total) by mouth 3 (three) times daily as needed for anxiety. (Patient taking differently: Take 0.25-0.5 mg by mouth See admin instructions. Take 1 tablet (0.5 mg) by mouth scheduled at bedtime & take 0.5 tablet (0.25 mg)  by mouth mid day.) 60 tablet 1   Continuous Glucose Sensor (FREESTYLE LIBRE 3 SENSOR) MISC as directed.     diltiazem (CARDIZEM CD) 120 MG 24 hr capsule TAKE 1 CAPSULE BY MOUTH DAILY 90 capsule 3   dofetilide (TIKOSYN) 250 MCG capsule Take 1 capsule (250 mcg total) by mouth 2 (two) times daily. 180 capsule 1   guaiFENesin (MUCINEX) 600 MG 12 hr tablet Take 600 mg by mouth 2 (two) times daily.     insulin aspart protamine - aspart (NOVOLOG 70/30 MIX) (70-30) 100 UNIT/ML FlexPen Inject 27 Units into the skin 2 (two) times daily with a meal. 15 mL 11   Insulin Pen Needle (PEN NEEDLES 3/16") 31G X 5 MM MISC 1 Pen by Does not apply route 2 (two) times daily before a meal. Before breakfast and dinner 100 each 1   loratadine (CLARITIN) 10 MG tablet Take 10 mg by mouth at bedtime.     mesalamine (LIALDA) 1.2 g EC tablet Take 1.2 g by mouth in the morning.     metoprolol succinate (TOPROL-XL) 50 MG 24 hr tablet Take 1 tablet (50 mg total) by mouth every evening.     Multiple Vitamins-Minerals (PRESERVISION AREDS 2 PO) Take 1 capsule by mouth 2 (two) times daily.     mupirocin cream (BACTROBAN) 2 % Apply 1 Application topically 2 (two) times daily. (Patient taking differently: Apply 1 Application topically 2 (two) times daily as needed (sores/wounds).) 15 g 0   nitrofurantoin, macrocrystal-monohydrate, (MACROBID) 100 MG capsule Take 100 mg by mouth at bedtime.     oxybutynin (DITROPAN) 5 MG tablet Take 5 mg by mouth at bedtime.     rosuvastatin (CRESTOR) 10 MG tablet Take 0.5 tablets (5 mg total) by mouth daily. 45 tablet 1   senna-docusate (SENOKOT-S) 8.6-50 MG tablet Take 1 tablet by mouth at bedtime. (Patient taking differently: Take 1 tablet by mouth at bedtime as needed (constipation.).)     tamsulosin (FLOMAX) 0.4 MG CAPS capsule Take 1 capsule (0.4 mg total) by mouth 2 times daily at 12 noon and 4 pm. (Patient taking differently: Take 0.4 mg by mouth in the morning and at bedtime.) 60 capsule 0    Tiotropium Bromide-Olodaterol (STIOLTO RESPIMAT) 2.5-2.5 MCG/ACT AERS Inhale 2 puffs into the lungs daily. 4 g 12   umeclidinium bromide (INCRUSE ELLIPTA) 62.5 MCG/ACT AEPB Inhale 1 puff into the lungs in the morning.     No current facility-administered medications for this visit.    SURGICAL HISTORY:  Past Surgical History:  Procedure Laterality Date   AMPUTATION Left 03/24/2018   Procedure: Transmetatarsal Amputation Left Foot;  Surgeon: Nadara Mustard, MD;  Location: [facility] OR;  Service: Orthopedics;  Laterality: Left;   BRONCHIAL BIOPSY  01/03/2023   Procedure: BRONCHIAL BIOPSIES;  Surgeon: Leslye Peer, MD;  Location: [facility] ENDOSCOPY;  Service: Pulmonary;;   BRONCHIAL BRUSHINGS  01/03/2023   Procedure: BRONCHIAL BRUSHINGS;  Surgeon: Leslye Peer, MD;  Location: [facility] ENDOSCOPY;  Service: Pulmonary;;   BRONCHIAL NEEDLE ASPIRATION BIOPSY  01/03/2023   Procedure: BRONCHIAL NEEDLE ASPIRATION BIOPSIES;  Surgeon: Leslye Peer, MD;  Location: [facility] ENDOSCOPY;  Service: Pulmonary;;   CARDIOVERSION N/A 05/05/2018   Procedure: CARDIOVERSION;  Surgeon: Chilton Si, MD;  Location: [facility] ENDOSCOPY;  Service: Cardiovascular;  Laterality: N/A;   CARDIOVERSION N/A 12/02/2022   Procedure: CARDIOVERSION;  Surgeon: Jodelle Red, MD;  Location: [facility] ENDOSCOPY;  Service: Cardiovascular;  Laterality: N/A;   COLONOSCOPY  12/2017   benign, remission from ulcerative colitis in 90s   FIDUCIAL MARKER PLACEMENT  01/03/2023   Procedure: FIDUCIAL MARKER PLACEMENT;  Surgeon: Leslye Peer, MD;  Location: [facility] ENDOSCOPY;  Service: Pulmonary;;   I & D EXTREMITY Left 03/17/2018   Procedure: IRRIGATION AND DEBRIDEMENT FOOT;  Surgeon: Yolonda Kida, MD;  Location: [facility] OR;  Service: Orthopedics;  Laterality: Left;   STUMP REVISION Left 05/10/2018   Procedure: REVISION LEFT TRANSMETATARSAL AMPUTATION;  Surgeon: Nadara Mustard, MD;  Location: [facility] OR;  Service: Orthopedics;  Laterality: Left;   VIDEO BRONCHOSCOPY WITH  RADIAL ENDOBRONCHIAL ULTRASOUND  01/03/2023   Procedure: VIDEO BRONCHOSCOPY WITH RADIAL ENDOBRONCHIAL ULTRASOUND;  Surgeon: Leslye Peer, MD;  Location: [facility] ENDOSCOPY;  Service: Pulmonary;;    REVIEW OF SYSTEMS:   Constitutional: Negative for appetite change, chills, fatigue, fever and unexpected weight change.  HENT: Negative for mouth sores, nosebleeds, sore throat and trouble swallowing.   Eyes: Negative for eye problems and icterus.  Respiratory: Positive for stable/slightly improved chronic cough.  Stable dyspnea on exertion.  Negative for hemoptysis, and wheezing.   Cardiovascular: Negative for chest pain and leg swelling.  Gastrointestinal: Negative for abdominal pain, constipation, diarrhea, nausea and vomiting.  Genitourinary: Negative for bladder incontinence, difficulty urinating, dysuria, frequency and hematuria.   Musculoskeletal: Negative for back pain, gait problem, neck pain and neck stiffness.  Skin: Negative for itching and rash.  Neurological: Negative for dizziness, extremity weakness, gait problem, headaches, light-headedness and seizures.  Hematological: Negative for adenopathy. Does not bruise/bleed easily.  Psychiatric/Behavioral: Negative for confusion, depression and sleep disturbance. The patient is not nervous/anxious.     PHYSICAL EXAMINATION:  Blood pressure 115/65, pulse 84, temperature (!) 97.3 F (36.3 C), temperature source Oral, resp. rate 16, weight 230 lb 8 oz (104.6 kg), SpO2 92%.  ECOG PERFORMANCE STATUS: 2-3   Physical Exam  Constitutional: Oriented to person, place, and time and well-developed, well-nourished, and in no distress. No distress.  HENT:  Head: Normocephalic and atraumatic.  Mouth/Throat: Oropharynx is clear and moist. No oropharyngeal exudate.  Eyes: Conjunctivae are normal. Right eye exhibits no discharge. Left eye exhibits no discharge. No scleral icterus.  Neck: Normal range of motion. Neck supple.  Cardiovascular: Normal rate,  irregular rhythm, normal heart sounds and intact distal pulses.   Pulmonary/Chest: Effort normal and breath sounds normal. No respiratory distress. No wheezes. No rales. On 3 L of supplemental oxygen no respiratory distress.  Abdominal: Soft. Bowel sounds are normal. Exhibits no distension and no mass. There is no tenderness.  Musculoskeletal: Normal range of motion. Exhibits no edema.  Lymphadenopathy:    No cervical adenopathy.  Neurological: Alert and oriented to person, place, and time. Exhibits normal muscle tone. Gait normal. Coordination normal.  Skin: Skin is warm and dry. No rash noted. Not diaphoretic. No erythema. No pallor.  Psychiatric: Mood, memory and judgment normal.  Vitals reviewed.  LABORATORY DATA: Lab Results  Component Value Date  WBC 7.4 04/06/2023   HGB 12.3 (L) 04/06/2023   HCT 38.2 (L) 04/06/2023   MCV 93.2 04/06/2023   PLT 273 04/06/2023      Chemistry      Component Value Date/Time   NA 137 04/06/2023 1011   NA 138 11/25/2022 1213   K 4.6 04/06/2023 1011   CL 104 04/06/2023 1011   CO2 26 04/06/2023 1011   BUN 27 (H) 04/06/2023 1011   BUN 32 (H) 11/25/2022 1213   CREATININE 1.56 (H) 04/06/2023 1011      Component Value Date/Time   CALCIUM 9.0 04/06/2023 1011   ALKPHOS 147 (H) 04/06/2023 1011   AST 14 (L) 04/06/2023 1011   ALT 11 04/06/2023 1011   BILITOT 0.6 04/06/2023 1011       RADIOGRAPHIC STUDIES:  LONG TERM MONITOR (3-14 DAYS)  Result Date: 03/21/2023 Patch Wear Time:  13 days and 20 hours 100% atrial flutter burden, ranging from 49-149 bpm (avg of 91 bpm) Less than 1% ventricular and supraventricular ectopy Patient triggered episodes associated with atrial flutter Will Camnitz, MD     ASSESSMENT/PLAN:  This is a very pleasant 82 year old Caucasian male referred to the clinic for what is felt to be 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  The patient presented with a right upper lobe and left upper  lobe pulmonary nodules.  He also has some smaller pulmonary nodules which will need to be monitored closely on future imaging studies.  He was diagnosed in April 2024.   We will request PD-L1 was 0%. Foundation one did not show any actionable mutations.   He underwent SBRT to the RUL and LUL lesions.   He had a restaging CT scan to monitor the LLL lesions.   The patient was seen with Dr. Arbutus Ped today.  Dr. Arbutus Ped personally and independently reviewed the scan and discussed results with the patient today.  The final radiology report is not available at this time. However, Dr. Arbutus Ped did not see any obvious signs of disease progression. I will call the patient with the final results. If no disease progression, Dr. Arbutus Ped recommends restaging CT in 6 months. If any concerns, we will call the patient with the new recommendations.   They gave me permission to leave a detailed voicemail with the results.   I will arrange for a restaging CT in 6 months. I will order without contrast due to his CKD.  The patient was advised to call immediately if he has any concerning symptoms in the interval. The patient voices understanding of current disease status and treatment options and is in agreement with the current care plan. All questions were answered. The patient knows to call the clinic with any problems, questions or concerns. We can certainly see the patient much sooner if necessary .          Orders Placed This Encounter  Procedures   CT CHEST WO CONTRAST    Standing Status:   Future    Standing Expiration Date:   04/10/2024    Order Specific Question:   Preferred imaging location?    Answer:   [facility]   CMP (Cancer Center only)    Standing Status:   Future    Standing Expiration Date:   04/10/2024   CBC with Differential (Cancer Center Only)    Standing Status:   Future    Standing Expiration Date:   04/10/2024     Sloka Volante L Amit Leece,  PA-C 04/11/23  ADDENDUM: Hematology/Oncology Attending: I had a face-to-face encounter with the patient today.  I reviewed his record, lab, scan and recommended his care plan.  This is a very pleasant 82 years old white male with synchronous stage Ia non-small cell lung cancer, adenocarcinoma presented with right upper lobe and left upper lobe pulmonary nodules diagnosed in April 2024.  He is status post SBRT to the bilateral nodules under the care of Dr. Mitzi Hansen completed on February 28, 2023. The patient is currently on observation and he is feeling fine with no concerning complaints except for the baseline shortness of breath and he is currently on home oxygen. He had repeat CT scan of the chest performed recently. I personally and independently reviewed the scan images and discussed the result with the patient today.  His scan showed a stable to decreased size of the right upper lobe lesion as well as a stable appearance of the left upper lobe lesions. There is no concerning findings for disease progression. I recommended for the patient to continue on observation with repeat CT scan of the chest in 6 months. The patient was advised to call immediately if he has any other concerning symptoms in the interval. The total time spent in the appointment was 20 minutes. Disclaimer: This note was dictated with voice recognition software. Similar sounding words can inadvertently be transcribed and may be missed upon review. Lajuana Matte, MD

## 2023-04-11 ENCOUNTER — Telehealth: Payer: Self-pay | Admitting: Physician Assistant

## 2023-04-11 ENCOUNTER — Inpatient Hospital Stay: Payer: Medicare Other | Admitting: Physician Assistant

## 2023-04-11 VITALS — BP 115/65 | HR 84 | Temp 97.3°F | Resp 16 | Wt 230.5 lb

## 2023-04-11 DIAGNOSIS — C3411 Malignant neoplasm of upper lobe, right bronchus or lung: Secondary | ICD-10-CM | POA: Diagnosis not present

## 2023-04-11 DIAGNOSIS — J9611 Chronic respiratory failure with hypoxia: Secondary | ICD-10-CM | POA: Diagnosis not present

## 2023-04-11 DIAGNOSIS — I129 Hypertensive chronic kidney disease with stage 1 through stage 4 chronic kidney disease, or unspecified chronic kidney disease: Secondary | ICD-10-CM | POA: Diagnosis not present

## 2023-04-11 DIAGNOSIS — C3412 Malignant neoplasm of upper lobe, left bronchus or lung: Secondary | ICD-10-CM | POA: Diagnosis not present

## 2023-04-11 DIAGNOSIS — J449 Chronic obstructive pulmonary disease, unspecified: Secondary | ICD-10-CM | POA: Diagnosis not present

## 2023-04-11 DIAGNOSIS — R918 Other nonspecific abnormal finding of lung field: Secondary | ICD-10-CM | POA: Diagnosis not present

## 2023-04-11 NOTE — Telephone Encounter (Signed)
I called the patient to review the final radiology overread of the scan results.  The scan showed stable to decrease in size of the treated right upper lobe lung lesion.  The other nodules are stable.  Dr. Arbutus Ped recommends follow-up CT scan in 6 months as we had discussed during his visit today. His wife expressed understanding

## 2023-04-11 NOTE — Pre-Procedure Instructions (Signed)
Voice message left for patient regarding procedure tomorrow: Instructed to arrive at 0730; not to eat nor drink anything after MN. To have a ride home and responsible adult to stay with him for 24 hours after procedure; Instructed not to miss any dose of Eliquis and to take tomorrow morning with a sip of water; take BP meds in the morning with a sip of water.

## 2023-04-12 ENCOUNTER — Ambulatory Visit (HOSPITAL_COMMUNITY)
Admission: RE | Admit: 2023-04-12 | Discharge: 2023-04-12 | Disposition: A | Discharge status: Home / Self Care | Payer: Medicare Other | Attending: Cardiovascular Disease | Admitting: Cardiovascular Disease

## 2023-04-12 ENCOUNTER — Ambulatory Visit (HOSPITAL_BASED_OUTPATIENT_CLINIC_OR_DEPARTMENT_OTHER): Payer: Medicare Other | Admitting: Anesthesiology

## 2023-04-12 ENCOUNTER — Ambulatory Visit (HOSPITAL_COMMUNITY): Payer: Medicare Other | Admitting: Anesthesiology

## 2023-04-12 ENCOUNTER — Ambulatory Visit: Payer: Medicare Other | Admitting: Physician Assistant

## 2023-04-12 ENCOUNTER — Other Ambulatory Visit: Payer: Self-pay

## 2023-04-12 ENCOUNTER — Encounter (HOSPITAL_COMMUNITY)
Admission: RE | Disposition: A | Discharge status: Home / Self Care | Payer: Self-pay | Source: Home / Self Care | Attending: Cardiovascular Disease

## 2023-04-12 DIAGNOSIS — I4891 Unspecified atrial fibrillation: Secondary | ICD-10-CM | POA: Diagnosis not present

## 2023-04-12 DIAGNOSIS — I4892 Unspecified atrial flutter: Secondary | ICD-10-CM | POA: Insufficient documentation

## 2023-04-12 DIAGNOSIS — I5032 Chronic diastolic (congestive) heart failure: Secondary | ICD-10-CM | POA: Diagnosis not present

## 2023-04-12 DIAGNOSIS — I2510 Atherosclerotic heart disease of native coronary artery without angina pectoris: Secondary | ICD-10-CM | POA: Diagnosis not present

## 2023-04-12 DIAGNOSIS — I5033 Acute on chronic diastolic (congestive) heart failure: Secondary | ICD-10-CM | POA: Diagnosis not present

## 2023-04-12 DIAGNOSIS — E1122 Type 2 diabetes mellitus with diabetic chronic kidney disease: Secondary | ICD-10-CM | POA: Insufficient documentation

## 2023-04-12 DIAGNOSIS — J9610 Chronic respiratory failure, unspecified whether with hypoxia or hypercapnia: Secondary | ICD-10-CM | POA: Diagnosis not present

## 2023-04-12 DIAGNOSIS — N1832 Chronic kidney disease, stage 3b: Secondary | ICD-10-CM | POA: Diagnosis not present

## 2023-04-12 DIAGNOSIS — Z87891 Personal history of nicotine dependence: Secondary | ICD-10-CM | POA: Diagnosis not present

## 2023-04-12 DIAGNOSIS — C3491 Malignant neoplasm of unspecified part of right bronchus or lung: Secondary | ICD-10-CM | POA: Insufficient documentation

## 2023-04-12 DIAGNOSIS — Z7901 Long term (current) use of anticoagulants: Secondary | ICD-10-CM | POA: Diagnosis not present

## 2023-04-12 DIAGNOSIS — Z79899 Other long term (current) drug therapy: Secondary | ICD-10-CM | POA: Diagnosis not present

## 2023-04-12 DIAGNOSIS — E785 Hyperlipidemia, unspecified: Secondary | ICD-10-CM | POA: Insufficient documentation

## 2023-04-12 DIAGNOSIS — D6869 Other thrombophilia: Secondary | ICD-10-CM | POA: Insufficient documentation

## 2023-04-12 DIAGNOSIS — I130 Hypertensive heart and chronic kidney disease with heart failure and stage 1 through stage 4 chronic kidney disease, or unspecified chronic kidney disease: Secondary | ICD-10-CM

## 2023-04-12 DIAGNOSIS — I4819 Other persistent atrial fibrillation: Secondary | ICD-10-CM

## 2023-04-12 DIAGNOSIS — J449 Chronic obstructive pulmonary disease, unspecified: Secondary | ICD-10-CM | POA: Insufficient documentation

## 2023-04-12 DIAGNOSIS — Z8673 Personal history of transient ischemic attack (TIA), and cerebral infarction without residual deficits: Secondary | ICD-10-CM | POA: Diagnosis not present

## 2023-04-12 HISTORY — PX: CARDIOVERSION: SHX1299

## 2023-04-12 LAB — GLUCOSE, CAPILLARY: Glucose-Capillary: 256 mg/dL — ABNORMAL HIGH (ref 70–99)

## 2023-04-12 SURGERY — CARDIOVERSION
Anesthesia: General

## 2023-04-12 MED ORDER — IPRATROPIUM-ALBUTEROL 0.5-2.5 (3) MG/3ML IN SOLN
RESPIRATORY_TRACT | Status: AC
  Filled 2023-04-12: qty 3

## 2023-04-12 MED ORDER — SODIUM CHLORIDE 0.9 % IV SOLN: INTRAVENOUS | Status: DC

## 2023-04-12 MED ORDER — LIDOCAINE 2% (20 MG/ML) 5 ML SYRINGE
INTRAMUSCULAR | Status: DC | PRN
  Administered 2023-04-12: 80 mg via INTRAVENOUS

## 2023-04-12 MED ORDER — PROPOFOL 10 MG/ML IV BOLUS
INTRAVENOUS | Status: DC | PRN
Start: 2023-04-12 — End: 2023-04-12
  Administered 2023-04-12: 70 mg via INTRAVENOUS

## 2023-04-12 MED ORDER — IPRATROPIUM-ALBUTEROL 0.5-2.5 (3) MG/3ML IN SOLN
3.0000 mL | Freq: Once | RESPIRATORY_TRACT | Status: AC
  Administered 2023-04-12: 3 mL via RESPIRATORY_TRACT

## 2023-04-12 SURGICAL SUPPLY — 1 items: ELECT DEFIB PAD ADLT CADENCE (PAD) ×1 IMPLANT

## 2023-04-12 NOTE — Discharge Instructions (Signed)

## 2023-04-12 NOTE — Op Note (Addendum)
Procedure: Electrical Cardioversion Indications:  Atrial Fibrillation  Procedure Details:  Consent: Risks of procedure as well as the alternatives and risks of each were explained to the (patient/caregiver).  Consent for procedure obtained.  Time Out: Verified patient identification, verified procedure, site/side was marked, verified correct patient position, special equipment/implants available, medications/allergies/relevent history reviewed, required imaging and test results available.  Performed  Patient placed on cardiac monitor, pulse oximetry, supplemental oxygen as necessary.  Sedation given:  propofol 70 mg IV Pacer pads placed anterior and posterior chest.  Cardioverted 1 time(s).  Cardioversion with synchronized biphasic 150J shock.  Evaluation: Findings: Post procedure EKG shows: NSR Complications: None Patient did tolerate procedure well.  Note that the blood pressure recorded in the right upper extremity is 20 mmHg lower than that recorded in the left upper extremity.  Time Spent Directly with the Patient:  30 minutes   Graig Hessling 04/12/2023, 8:53 AM

## 2023-04-12 NOTE — Anesthesia Preprocedure Evaluation (Addendum)
Anesthesia Evaluation  Patient identified by MRN, date of birth, ID band Patient awake    Reviewed: Allergy & Precautions, NPO status , Patient's Chart, lab work & pertinent test results  Airway Mallampati: II  TM Distance: >3 FB Neck ROM: Full    Dental  (+) Dental Advisory Given, Missing, Poor Dentition   Pulmonary shortness of breath, COPD,  COPD inhaler, former smoker    + decreased breath sounds+ wheezing  (-) rales    Cardiovascular hypertension, Pt. on medications and Pt. on home beta blockers + CAD and +CHF  Normal cardiovascular exam+ dysrhythmias Atrial Fibrillation  Rhythm:Regular Rate:Normal  Echo 11/2022  1. Left ventricular ejection fraction, by estimation, is 55 to 60%. The left ventricle has normal function. The left ventricle has no regional wall motion abnormalities. Left ventricular diastolic parameters are indeterminate. There is the interventricular septum is flattened in systole, suggestive of right ventricular pressure overload.   2. Right ventricular systolic function is mildly reduced. The right ventricular size is moderately enlarged. There is normal pulmonary artery systolic pressure. The estimated right ventricular systolic pressure is 30.5 mmHg.   3. Left atrial size was mildly dilated.   4. Right atrial size was moderately dilated.   5. The mitral valve is grossly normal. Trivial mitral valve regurgitation. No evidence of mitral stenosis.   6. The aortic valve is tricuspid. There is mild calcification of the aortic valve. There is mild thickening of the aortic valve. Aortic valve regurgitation is not visualized. No aortic stenosis is present.   7. The inferior vena cava is normal in size with greater than 50% respiratory variability, suggesting right atrial pressure of 3 mmHg.     Neuro/Psych  PSYCHIATRIC DISORDERS Anxiety     CVA    GI/Hepatic negative GI ROS, Neg liver ROS,,,  Endo/Other  diabetes     Renal/GU Renal InsufficiencyRenal disease     Musculoskeletal negative musculoskeletal ROS (+)    Abdominal   Peds  Hematology  (+) Blood dyscrasia, anemia   Anesthesia Other Findings   Reproductive/Obstetrics                             Anesthesia Physical Anesthesia Plan  ASA: 3  Anesthesia Plan: General   Post-op Pain Management: Minimal or no pain anticipated   Induction: Intravenous  PONV Risk Score and Plan: 2 and Propofol infusion, TIVA and Treatment may vary due to age or medical condition  Airway Management Planned: Natural Airway  Additional Equipment:   Intra-op Plan:   Post-operative Plan:   Informed Consent: I have reviewed the patients History and Physical, chart, labs and discussed the procedure including the risks, benefits and alternatives for the proposed anesthesia with the patient or authorized representative who has indicated his/her understanding and acceptance.     Dental advisory given  Plan Discussed with: CRNA  Anesthesia Plan Comments:         Anesthesia Quick Evaluation

## 2023-04-12 NOTE — Transfer of Care (Signed)
Immediate Anesthesia Transfer of Care Note  Patient: Kevin [name]  Procedure(s) Performed: CARDIOVERSION  Patient Location: Cath Lab  Anesthesia Type:General  Level of Consciousness: drowsy and patient cooperative  Airway & Oxygen Therapy: Patient Spontanous Breathing and Patient connected to nasal cannula oxygen  Post-op Assessment: Report given to RN and Post -op Vital signs reviewed and stable  Post vital signs: Reviewed and stable  Last Vitals:  Vitals Value Taken Time  BP 96/63 04/12/23 0852  Temp    Pulse 56 04/12/23 0853  Resp 25 04/12/23 0853  SpO2 84 % 04/12/23 0853    Last Pain:  Vitals:   04/12/23 0834  TempSrc: Temporal  PainSc: 0-No pain         Complications: No notable events documented.

## 2023-04-12 NOTE — Interval H&P Note (Signed)
History and Physical Interval Note:  04/12/2023 8:16 AM  Kevin [name]  has presented today for surgery, with the diagnosis of AFIB.  The various methods of treatment have been discussed with the patient and family. After consideration of risks, benefits and other options for treatment, the patient has consented to  Procedure(s): CARDIOVERSION (N/A) as a surgical intervention.  The patient's history has been reviewed, patient examined, no change in status, stable for surgery.  I have reviewed the patient's chart and labs.  Questions were answered to the patient's satisfaction.     Kevin [name]

## 2023-04-13 ENCOUNTER — Other Ambulatory Visit: Payer: Self-pay | Admitting: Gastroenterology

## 2023-04-13 ENCOUNTER — Encounter (HOSPITAL_COMMUNITY): Payer: Self-pay | Admitting: Cardiovascular Disease

## 2023-04-13 ENCOUNTER — Telehealth: Payer: Self-pay

## 2023-04-13 NOTE — Telephone Encounter (Signed)
   Pre-operative Risk Assessment    Patient Name: Kevin [name]  DOB: 1941-07-29 MRN: [id number]      Request for Surgical Clearance    Procedure:   colonoscopy  Date of Surgery:  Clearance 06/07/23                                 Surgeon:  Dr. Marca Ancona Surgeon's Group or Practice Name:  [facility] Phone number:  [phone] Fax number:  [phone]   Type of Clearance Requested:   - Medical  - Pharmacy:  Hold Apixaban (Eliquis)     Type of Anesthesia:  Not Indicated   Additional requests/questions:    [name]   04/13/2023, 4:50 PM

## 2023-04-13 NOTE — Anesthesia Postprocedure Evaluation (Signed)
Anesthesia Post Note  Patient: Kevin [name]  Procedure(s) Performed: CARDIOVERSION     Patient location during evaluation: PACU Anesthesia Type: General Level of consciousness: sedated and patient cooperative Pain management: pain level controlled Vital Signs Assessment: post-procedure vital signs reviewed and stable Respiratory status: spontaneous breathing Cardiovascular status: stable Anesthetic complications: no   No notable events documented.  Last Vitals:  Vitals:   04/12/23 0906 04/12/23 0909  BP: (!) 76/63 (!) 89/47  Pulse: (!) 50   Resp: (!) 23   Temp:    SpO2: 91%     Last Pain:  Vitals:   04/12/23 0904  TempSrc: Temporal  PainSc: 0-No pain                 Lewie Loron

## 2023-04-14 NOTE — Telephone Encounter (Signed)
Patient scheduled for 8-27 with Edd Fabian NP

## 2023-04-14 NOTE — Telephone Encounter (Signed)
Left message for the patient to contact the office. Advised on voicemail that we can see if we have a sooner appointment to give the patient clearance before the upcoming procedure of if he would like to move the colonoscopy back until after he sees Dr Clifton James. Advised the patient to contact the office back and let us know how he wants to proceed.

## 2023-04-14 NOTE — Telephone Encounter (Signed)
   Name: Kevin [name]  DOB: 26-May-1941  MRN: [id number]  Primary Cardiologist: Verne Carrow, MD  Chart reviewed as part of pre-operative protocol coverage. Because of Kevin [name]'s past medical history and time since last visit, he will require a follow-up in-office visit in order to better assess preoperative cardiovascular risk.  Pre-op covering staff: - Please schedule appointment and call patient to inform them. If patient already had an upcoming appointment within acceptable timeframe, please add "pre-op clearance" to the appointment notes so provider is aware. - Please contact requesting surgeon's office via preferred method (i.e, phone, fax) to inform them of need for appointment prior to surgery.  This message will also be routed to pharmacy pool for input on holding Eliquis as requested below so that this information is available to the clearing provider at time of patient's appointment.  Of note, patient is s/p DCCV on 04/12/2023.  Joylene Grapes, NP  04/14/2023, 11:09 AM

## 2023-04-15 NOTE — Telephone Encounter (Signed)
Patient with diagnosis of afib on Eliquis for anticoagulation.    Procedure: colonoscopy Date of procedure: 06/07/23   CHA2DS2-VASc Score = 8   This indicates a 10.8% annual risk of stroke. The patient's score is based upon: CHF History: 1 HTN History: 1 Diabetes History: 1 Stroke History: 2 Vascular Disease History: 1 Age Score: 2 Gender Score: 0      Patient had a cardioversion 04/12/23. His colonoscopy is scheduled more than 4 weeks after his cardioversion.   CrCl 57 ml/min  Per office protocol, patient can hold Eliquis for ideally 1 day prior to procedure.   Ideally patient hold Eliquis for 1 day prior to procedure due to hx of stroke. If MD is not ok with a 1 day hold, patient may hold 2 days prior.  **This guidance is not considered finalized until pre-operative APP has relayed final recommendations.**

## 2023-04-16 ENCOUNTER — Telehealth: Payer: Self-pay | Admitting: Student

## 2023-04-16 NOTE — Telephone Encounter (Addendum)
   The patient's wife called the after-hours line reporting that he has a cold and she wanted to verify which medications he could take. Encouraged him to use plain OTC Mucinex given his history of atrial fibrillation and recent DCCV. Recommended to avoid OTC cold products like Nyquil which contain multiple ingredients. They will follow-up with his PCP next week if symptoms do not improve.   Signed, Ellsworth Lennox, PA-C 04/16/2023, 3:02 PM

## 2023-04-18 ENCOUNTER — Ambulatory Visit: Payer: Medicare Other | Admitting: Radiation Oncology

## 2023-04-18 ENCOUNTER — Telehealth: Payer: Self-pay

## 2023-04-18 ENCOUNTER — Encounter: Payer: Self-pay | Admitting: Student

## 2023-04-18 NOTE — Telephone Encounter (Signed)
Patients wife calls nurse line reporting cold symptoms.   She reports cough and congestion since Thursday. She reports yellowish phlegm production.   She denies any fevers, chills, headaches, SOB or GI upset.   She reports she was told by Cardiology he should not take OTC cold relief medications and was told to call his PCP for a prescription.   Discussed warm fluids with honey.   Will forward to PCP for advisement.

## 2023-04-26 ENCOUNTER — Ambulatory Visit (HOSPITAL_COMMUNITY)
Admission: RE | Admit: 2023-04-26 | Discharge: 2023-04-26 | Disposition: A | Discharge status: Home / Self Care | Payer: Medicare Other | Source: Ambulatory Visit | Attending: Internal Medicine | Admitting: Internal Medicine

## 2023-04-26 ENCOUNTER — Encounter (HOSPITAL_COMMUNITY): Payer: Self-pay | Admitting: Internal Medicine

## 2023-04-26 ENCOUNTER — Other Ambulatory Visit: Payer: Self-pay | Admitting: Student

## 2023-04-26 VITALS — BP 110/68 | HR 97 | Ht 74.0 in | Wt 228.6 lb

## 2023-04-26 DIAGNOSIS — Z9981 Dependence on supplemental oxygen: Secondary | ICD-10-CM | POA: Insufficient documentation

## 2023-04-26 DIAGNOSIS — N1832 Chronic kidney disease, stage 3b: Secondary | ICD-10-CM | POA: Insufficient documentation

## 2023-04-26 DIAGNOSIS — I5032 Chronic diastolic (congestive) heart failure: Secondary | ICD-10-CM | POA: Insufficient documentation

## 2023-04-26 DIAGNOSIS — E1165 Type 2 diabetes mellitus with hyperglycemia: Secondary | ICD-10-CM | POA: Diagnosis not present

## 2023-04-26 DIAGNOSIS — J449 Chronic obstructive pulmonary disease, unspecified: Secondary | ICD-10-CM | POA: Diagnosis not present

## 2023-04-26 DIAGNOSIS — Z794 Long term (current) use of insulin: Secondary | ICD-10-CM | POA: Insufficient documentation

## 2023-04-26 DIAGNOSIS — I4892 Unspecified atrial flutter: Secondary | ICD-10-CM | POA: Insufficient documentation

## 2023-04-26 DIAGNOSIS — Z79899 Other long term (current) drug therapy: Secondary | ICD-10-CM | POA: Insufficient documentation

## 2023-04-26 DIAGNOSIS — Z85118 Personal history of other malignant neoplasm of bronchus and lung: Secondary | ICD-10-CM | POA: Diagnosis not present

## 2023-04-26 DIAGNOSIS — I129 Hypertensive chronic kidney disease with stage 1 through stage 4 chronic kidney disease, or unspecified chronic kidney disease: Secondary | ICD-10-CM | POA: Diagnosis not present

## 2023-04-26 DIAGNOSIS — Z8616 Personal history of COVID-19: Secondary | ICD-10-CM | POA: Diagnosis not present

## 2023-04-26 DIAGNOSIS — J9610 Chronic respiratory failure, unspecified whether with hypoxia or hypercapnia: Secondary | ICD-10-CM | POA: Diagnosis not present

## 2023-04-26 DIAGNOSIS — C3411 Malignant neoplasm of upper lobe, right bronchus or lung: Secondary | ICD-10-CM

## 2023-04-26 DIAGNOSIS — Z923 Personal history of irradiation: Secondary | ICD-10-CM | POA: Insufficient documentation

## 2023-04-26 DIAGNOSIS — N400 Enlarged prostate without lower urinary tract symptoms: Secondary | ICD-10-CM | POA: Diagnosis not present

## 2023-04-26 DIAGNOSIS — Z7901 Long term (current) use of anticoagulants: Secondary | ICD-10-CM | POA: Diagnosis not present

## 2023-04-26 DIAGNOSIS — Z8249 Family history of ischemic heart disease and other diseases of the circulatory system: Secondary | ICD-10-CM | POA: Diagnosis not present

## 2023-04-26 DIAGNOSIS — D6869 Other thrombophilia: Secondary | ICD-10-CM | POA: Diagnosis not present

## 2023-04-26 DIAGNOSIS — I4819 Other persistent atrial fibrillation: Secondary | ICD-10-CM | POA: Diagnosis not present

## 2023-04-26 DIAGNOSIS — Z8673 Personal history of transient ischemic attack (TIA), and cerebral infarction without residual deficits: Secondary | ICD-10-CM | POA: Diagnosis not present

## 2023-04-26 NOTE — Progress Notes (Signed)
Primary Care Physician: [name], MD Primary Cardiologist: Dr. Clifton James Primary Electrophysiologist: Dr Elberta Fortis  Referring Physician: Dr Deanne Coffer Kevin [name] is a 82 y.o. 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 History of cardioversion in 2019. Seen by Cardiology on 11/25/22 for shortness of breath and was noted to be in Afib without RVR. He underwent DCCV on 12/02/22 with successful conversion to normal rhythm after 2 shocks. He was recently admitted 3/12-18/24 for acute on chronic respiratory failure with hypoxia in the setting of COPD and CHF exacerbation and atrial fibrillation. Patient is on Eliquis 5 mg BID for a CHADS2VASC score of 8.  On evaluation 12/27/22, he is in Afib today. Wife and patient agree that he seems to be more short of breath when he is in Afib. He uses oxygen at night. Review of records demonstrate he underwent DCCV on 3/7 and most likely went back into Afib on 3/12. He has been compliant with Eliquis and has not missed any doses. He has a scheduled bronchoscopy for RUL nodule on 4/8. He will stop taking Eliquis on 4/5 prior to procedure.   He drinks several beers on the weekend.   On follow up today 5/1, he is in Afib. 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 He is scheduled for stereotactic body radiotherapy (SBRT) for this finding on 5/20. He is more short of breath than usual when in Afib. He has not missed any doses of anticoagulation.  On follow up 5/14, patient is in Afib and here today for Tikosyn admission. He has not used benadryl in the past few days. He has not  missed any doses of anticoagulation. He is currently finishing prescription of keflex for cellulitis of lower extremity and only has 2 doses left after today's morning dose. He did transition to macrobid daily. He is currently on effexor and started it on 5/5; notes the current initial dose of 37.5 mg is not helping with anxiety. He is more short of breath as noted before while in Afib.   Follow up in the AF clinic 02/23/23. Patient is s/p dofetilide loading 5/14-5/17/24. He converted with the medication and did not require DCCV. His dose was decreased to 250 mcg as his CrCl with occasionally dip below 60 mL/min. He states that he had been feeling "great" up until this AM. He has felt more anxious and SOB today. ECG shows he is back in afib. He is on O2 nasal canula.   F/u in Afib clinic 04/07/23, he is currently in atrial flutter. Cardiac monitor worn June 2024 showed 100% atrial flutter. He missed a dose of Tikosyn by accident in June. No missed doses of Eliquis.  F/u in Afib clinic 04/26/23, he is currently in atrial flutter. S/p successful DCCV on 04/12/23. He feels tired and out of breath when he is out of rhythm. He uses nighttime oxygen 3L via nasal cannula. He uses oxygen sparingly during the day (it is not continuous). He is in remission from lung cancer with oncology plan to repeat CT in 6 months. No missed doses of Tikosyn or Eliquis.     Today, he denies symptoms of palpitations, chest pain,  orthopnea, PND, lower extremity edema, dizziness, presyncope, syncope, snoring, daytime somnolence, bleeding, or neurologic sequela. The patient is tolerating medications without difficulties and is otherwise without complaint today.    Atrial Fibrillation Risk Factors:  he does not have symptoms or diagnosis of sleep apnea. he does not have a history of rheumatic fever. he does not have a history of alcohol use. The patient does not have a history of early familial atrial fibrillation or other  arrhythmias.  he has a BMI of Body mass index is 29.35 kg/m.Marland Kitchen Filed Weights   04/26/23 1353  Weight: 103.7 kg    Family History  Problem Relation Age of Onset   Hypertension Father     Atrial Fibrillation Management history:  Previous antiarrhythmic drugs: dofetilide  Previous cardioversions: 2019, 12/02/22, 04/12/23 Previous ablations: None Anticoagulation history: Eliquis 5 mg BID   Past Medical History:  Diagnosis Date   A-fib (HCC) 03/20/2018   Acute blood loss anemia    Anxiety    Benign prostatic hyperplasia with urinary retention    Cerebrovascular accident (CVA) (HCC)    CHF (congestive heart failure) (HCC)    COPD (chronic obstructive pulmonary disease) (HCC)    Diabetes mellitus type 2 in nonobese (HCC)    Dyspnea    Dysrhythmia    A-Fib   Essential hypertension 03/20/2018   Former tobacco use    H/O ulcerative colitis 1993   Leukocytosis    Lung cancer (HCC)    Osteomyelitis (HCC)    a. 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   PAF (paroxysmal atrial fibrillation) (HCC)    a. dx 03/2018 in setting of stroke, osteomyelitis.   Stage 3 chronic kidney disease (HCC)    pt/family unaware   Status post transmetatarsal amputation of left foot [facility])    Umbilical hernia    Past Surgical History:  Procedure Laterality Date   AMPUTATION Left 03/24/2018   Procedure: Transmetatarsal Amputation Left Foot;  Surgeon: Nadara Mustard, MD;  Location: [facility] OR;  Service: Orthopedics;  Laterality: Left;   BRONCHIAL BIOPSY  01/03/2023   Procedure: BRONCHIAL BIOPSIES;  Surgeon: Leslye Peer, MD;  Location: [facility] ENDOSCOPY;  Service: Pulmonary;;   BRONCHIAL BRUSHINGS  01/03/2023   Procedure: BRONCHIAL BRUSHINGS;  Surgeon: Leslye Peer, MD;  Location: [facility] ENDOSCOPY;  Service: Pulmonary;;   BRONCHIAL NEEDLE ASPIRATION BIOPSY  01/03/2023   Procedure: BRONCHIAL NEEDLE ASPIRATION BIOPSIES;  Surgeon: Leslye Peer, MD;  Location: [facility] ENDOSCOPY;  Service: Pulmonary;;   CARDIOVERSION N/A  05/05/2018   Procedure: CARDIOVERSION;  Surgeon: Chilton Si, MD;  Location: [facility] ENDOSCOPY;  Service: Cardiovascular;  Laterality: N/A;   CARDIOVERSION N/A 12/02/2022   Procedure: CARDIOVERSION;  Surgeon: Jodelle Red, MD;  Location: [facility] ENDOSCOPY;  Service: Cardiovascular;  Laterality: N/A;   CARDIOVERSION N/A 04/12/2023   Procedure: CARDIOVERSION;  Surgeon: Thurmon Fair, MD;  Location: [facility] INVASIVE CV LAB;  Service: Cardiovascular;  Laterality: N/A;   COLONOSCOPY  12/2017   benign, remission from ulcerative colitis in 90s   FIDUCIAL MARKER PLACEMENT  01/03/2023   Procedure: FIDUCIAL MARKER PLACEMENT;  Surgeon: Leslye Peer, MD;  Location: [facility] ENDOSCOPY;  Service: Pulmonary;;   I & D EXTREMITY Left 03/17/2018   Procedure: IRRIGATION AND DEBRIDEMENT FOOT;  Surgeon: Yolonda Kida, MD;  Location: [facility] OR;  Service: Orthopedics;  Laterality: Left;   STUMP REVISION Left 05/10/2018   Procedure: REVISION LEFT TRANSMETATARSAL AMPUTATION;  Surgeon: Nadara Mustard, MD;  Location: [facility] OR;  Service: Orthopedics;  Laterality: Left;   VIDEO  BRONCHOSCOPY WITH RADIAL ENDOBRONCHIAL ULTRASOUND  01/03/2023   Procedure: VIDEO BRONCHOSCOPY WITH RADIAL ENDOBRONCHIAL ULTRASOUND;  Surgeon: Leslye Peer, MD;  Location: [facility] ENDOSCOPY;  Service: Pulmonary;;    Current Outpatient Medications  Medication Sig Dispense Refill   acetaminophen (TYLENOL) 500 MG tablet Take 1,000 mg by mouth at bedtime as needed (pain.).     albuterol (VENTOLIN HFA) 108 (90 Base) MCG/ACT inhaler Inhale 2 puffs into the lungs every 6 (six) hours as needed for wheezing or shortness of breath. 24 g 1   apixaban (ELIQUIS) 5 MG TABS tablet Take 1 tablet (5 mg total) by mouth 2 (two) times daily. Okay to restart this medication with your evening dose on 01/04/2023 180 tablet 1   clonazePAM (KLONOPIN) 0.5 MG tablet Take 1 tablet (0.5 mg total) by mouth 3 (three) times daily as needed for anxiety. (Patient taking differently: Take 0.25-0.5  mg by mouth See admin instructions. Take 1 tablet (0.5 mg) by mouth scheduled at bedtime & take 0.5 tablet (0.25 mg) by mouth mid day.) 60 tablet 1   Continuous Glucose Sensor (FREESTYLE LIBRE 3 SENSOR) MISC as directed.     diltiazem (CARDIZEM CD) 120 MG 24 hr capsule TAKE 1 CAPSULE BY MOUTH DAILY 90 capsule 3   dofetilide (TIKOSYN) 250 MCG capsule Take 1 capsule (250 mcg total) by mouth 2 (two) times daily. 180 capsule 1   guaiFENesin (MUCINEX) 600 MG 12 hr tablet Take 600 mg by mouth 2 (two) times daily.     insulin aspart protamine - aspart (NOVOLOG 70/30 MIX) (70-30) 100 UNIT/ML FlexPen Inject 27 Units into the skin 2 (two) times daily with a meal. 15 mL 11   Insulin Pen Needle (PEN NEEDLES 3/16") 31G X 5 MM MISC 1 Pen by Does not apply route 2 (two) times daily before a meal. Before breakfast and dinner 100 each 1   loratadine (CLARITIN) 10 MG tablet Take 10 mg by mouth at bedtime.     mesalamine (LIALDA) 1.2 g EC tablet Take 1.2 g by mouth in the morning.     metoprolol succinate (TOPROL-XL) 50 MG 24 hr tablet Take 1 tablet (50 mg total) by mouth every evening.     Multiple Vitamins-Minerals (PRESERVISION AREDS 2 PO) Take 1 capsule by mouth 2 (two) times daily.     mupirocin cream (BACTROBAN) 2 % Apply 1 Application topically 2 (two) times daily. (Patient taking differently: Apply 1 Application topically 2 (two) times daily as needed (sores/wounds).) 15 g 0   nitrofurantoin, macrocrystal-monohydrate, (MACROBID) 100 MG capsule Take 100 mg by mouth at bedtime.     oxybutynin (DITROPAN) 5 MG tablet Take 5 mg by mouth at bedtime.     rosuvastatin (CRESTOR) 10 MG tablet Take 0.5 tablets (5 mg total) by mouth daily. 45 tablet 1   senna-docusate (SENOKOT-S) 8.6-50 MG tablet Take 1 tablet by mouth at bedtime. (Patient taking differently: Take 1 tablet by mouth at bedtime as needed (constipation.).)     tamsulosin (FLOMAX) 0.4 MG CAPS capsule Take 1 capsule (0.4 mg total) by mouth 2 times daily at 12  noon and 4 pm. (Patient taking differently: Take 0.4 mg by mouth in the morning and at bedtime.) 60 capsule 0   Tiotropium Bromide-Olodaterol (STIOLTO RESPIMAT) 2.5-2.5 MCG/ACT AERS Inhale 2 puffs into the lungs daily. 4 g 12   umeclidinium bromide (INCRUSE ELLIPTA) 62.5 MCG/ACT AEPB Inhale 1 puff into the lungs in the morning.     No current facility-administered medications for this encounter.  Allergies  Allergen Reactions   Effexor [Venlafaxine] Other (See Comments)    psychosis and hallucinations   Lipitor [Atorvastatin] Other (See Comments)    Memory loss   Other     Per spouse, pt needs to avoid any abx that could potentially cause prolonged QT syndrome    ROS- All systems are reviewed and negative except as per the HPI above.  Physical Exam: Vitals:   04/26/23 1353  BP: 110/68  Pulse: 97  Weight: 103.7 kg  Height: 6\' 2"  (1.88 m)    GEN- The patient is well appearing, alert and oriented x 3 today.   Neck - no JVD or carotid bruit noted Lungs- Clear to ausculation bilaterally, normal work of breathing Heart- Irregular rate and rhythm, no murmurs, rubs or gallops, PMI not laterally displaced Extremities- no clubbing, cyanosis, or edema Skin - no rash or ecchymosis noted   Wt Readings from Last 3 Encounters:  04/26/23 103.7 kg  04/12/23 104.3 kg  04/11/23 104.6 kg   EKG today demonstrates  Vent. rate 97 BPM PR interval 248 ms QRS duration 156 ms QT/QTcB 436/553 ms P-R-T axes * 254 2 Sinus rhythm with 1st degree A-V block - it appears to be atrial flutter Right bundle branch block Minimal voltage criteria for LVH, may be normal variant ( R in aVL ) Abnormal ECG When compared with ECG of 12-Apr-2023 08:55, PREVIOUS ECG IS PRESENT  Echo 12/08/22 demonstrated: 1. Left ventricular ejection fraction, by estimation, is 55 to 60%. The  left ventricle has normal function. The left ventricle has no regional  wall motion abnormalities. Left ventricular diastolic  parameters are  indeterminate. There is the interventricular septum is flattened in systole, suggestive of right ventricular pressure overload.   2. Right ventricular systolic function is mildly reduced. The right  ventricular size is moderately enlarged. There is normal pulmonary artery  systolic pressure. The estimated right ventricular systolic pressure is  30.5 mmHg.   3. Left atrial size was mildly dilated.   4. Right atrial size was moderately dilated.   5. The mitral valve is grossly normal. Trivial mitral valve  regurgitation. No evidence of mitral stenosis.   6. The aortic valve is tricuspid. There is mild calcification of the  aortic valve. There is mild thickening of the aortic valve. Aortic valve  regurgitation is not visualized. No aortic stenosis is present.   7. The inferior vena cava is normal in size with greater than 50%  respiratory variability, suggesting right atrial pressure of 3 mmHg.   Cardiac monitor 02/2023: Patch Wear Time:  13 days and 20 hours    100% atrial flutter burden, ranging from 49-149 bpm (avg of 91 bpm) Less than 1% ventricular and supraventricular ectopy Patient triggered episodes associated with atrial flutter   Will Camnitz, MD  Epic records are reviewed at length today.  CHA2DS2-VASc Score = 8  The patient's score is based upon: CHF History: 1 HTN History: 1 Diabetes History: 1 Stroke History: 2 Vascular Disease History: 1 Age Score: 2 Gender Score: 0      ASSESSMENT AND PLAN: 1. Persistent Atrial Fibrillation (ICD10:  I48.0) The patient's CHA2DS2-VASc score is 8, indicating a 10.8% annual risk of stroke.   S/p dofetilide loading 5/14-5/17/24. S/p successful DCCV on 04/12/23.   He is currently in atrial flutter. He has early return of arrhythmia s/p successful DCCV. This would suggest unfortunately that Tikosyn is not maintaining him in normal rhythm. He could technically be a candidate for amiodarone  but unsure if this is possible  due to his recent lung cancer treatment.  He is in remission from lung cancer s/p radiotherapy treatment. I will send him to EP to discuss possibility of ablation. I advised wife and patient that his nighttime oxygen may be prohibitive. They will discuss with Dr. Elberta Fortis whether ablation or amiodarone or rate control is the most preferred option.   Continue dofetilide 250 mcg BID Continue diltiazem 120 mg daily Continue metoprolol 50 mg daily  2. 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  Continue Apixaban (Eliquis).  No missed doses.  3. HTN Stable, no changes today.  4. Chronic diastolic CHF Fluid status appears stable.   5. Malignant neoplasm of right lung Wife states he has completed stereotactic radiotherapy treatment and oncology plan to repeat CT in 6 months.    Follow up as scheduled with Dr. Elberta Fortis.    Lake Bells, PA-C Afib [facility] [address] [place], [place] [address] [phone] 04/26/2023 3:33 PM

## 2023-05-01 ENCOUNTER — Other Ambulatory Visit: Payer: Self-pay | Admitting: Cardiovascular Disease

## 2023-05-01 DIAGNOSIS — I4891 Unspecified atrial fibrillation: Secondary | ICD-10-CM

## 2023-05-02 ENCOUNTER — Ambulatory Visit (INDEPENDENT_AMBULATORY_CARE_PROVIDER_SITE_OTHER): Payer: Medicare Other | Admitting: Orthopedic Surgery

## 2023-05-02 ENCOUNTER — Other Ambulatory Visit: Payer: Self-pay | Admitting: Physician Assistant

## 2023-05-02 DIAGNOSIS — Z89432 Acquired absence of left foot: Secondary | ICD-10-CM

## 2023-05-02 DIAGNOSIS — L97521 Non-pressure chronic ulcer of other part of left foot limited to breakdown of skin: Secondary | ICD-10-CM | POA: Diagnosis not present

## 2023-05-02 MED ORDER — DOXYCYCLINE HYCLATE 100 MG PO CAPS: 100.0000 mg | ORAL_CAPSULE | Freq: Two times a day (BID) | ORAL | 0 refills | Status: DC

## 2023-05-03 ENCOUNTER — Encounter: Payer: Self-pay | Admitting: Orthopedic Surgery

## 2023-05-03 MED ORDER — DOXYCYCLINE HYCLATE 100 MG PO TABS: 100.0000 mg | ORAL_TABLET | Freq: Two times a day (BID) | ORAL | 0 refills | Status: DC

## 2023-05-03 NOTE — Progress Notes (Signed)
Office Visit Note   Patient: Kevin [name]           Date of Birth: 1941/05/15           MRN: [id number] Visit Date: 05/02/2023              Requested by: [name], MD [address] [place],  [place] [address] PCP: [name], MD  Chief Complaint  Patient presents with   Left Foot - Routine Post Op      HPI: Patient is a 82 year old gentleman who is seen for for evaluation for a Wagner grade 1 ulcer plantar aspect left transmetatarsal amputation.  Patient states he has recently has had radiation therapy for lung cancer.    Visit Diagnoses:  1. Non-pressure chronic ulcer of other part of left foot limited to breakdown of skin (HCC)   2. History of transmetatarsal amputation of left foot (HCC)     Plan: Ulcer was debrided patient was provided a prescription for doxycycline.  Follow-Up Instructions: No follow-ups on file.   Ortho Exam  Patient is alert, oriented, no adenopathy, well-dressed, normal affect, normal respiratory effort. Examination patient has a small ulcer beneath the left transmetatarsal amputation there is no ascending cellulitis.  After informed consent a 10 blade knife was used to debride the skin and soft tissue.  There was a small abscess that was decompressed.  After debridement this left the wound that was 2 cm in diameter 1 cm deep with healthy granulation tissue.  Patient will start Dial soap cleansing dry dressing change prescription for doxycycline and a postoperative shoe.  Imaging: No results found. No images are attached to the encounter.  Labs: Lab Results  Component Value Date   HGBA1C 7.3 (A) 02/18/2023   HGBA1C 10.2 (H) 12/08/2022   HGBA1C 8.7 (H) 08/20/2019   ESRSEDRATE 28 (H) 12/12/2022   CRP 3.7 (H) 12/12/2022   CRP 0.8 08/24/2019   CRP <0.8 08/23/2019   REPTSTATUS 12/12/2022 FINAL 12/10/2022   GRAMSTAIN  03/17/2018    MODERATE WBC PRESENT, PREDOMINANTLY PMN MODERATE GRAM POSITIVE COCCI FEW GRAM NEGATIVE  RODS    CULT  12/10/2022    NO GROWTH Performed at [facility], [address] [place], [place] [address]    Imelda Pillow ENTEROCOCCUS FAECALIS (A) 08/20/2019     Lab Results  Component Value Date   ALBUMIN 3.5 04/06/2023   ALBUMIN 2.7 (L) 12/13/2022   ALBUMIN 2.7 (L) 12/12/2022    Lab Results  Component Value Date   MG 1.8 04/06/2023   MG 2.2 02/23/2023   MG 2.0 02/11/2023   No results found for: "VD25OH"  No results found for: "PREALBUMIN"    Latest Ref Rng & Units 04/06/2023   10:11 AM 02/23/2023    4:57 PM 02/10/2023    3:04 AM  CBC EXTENDED  WBC 4.0 - 10.5 K/uL 7.4  8.6  8.2   RBC 4.22 - 5.81 MIL/uL 4.10  3.98  3.85   Hemoglobin 13.0 - 17.0 g/dL 57.8  46.9  62.9   HCT 39.0 - 52.0 % 38.2  38.5  35.8   Platelets 150 - 400 K/uL 273  250  207   NEUT# 1.7 - 7.7 K/uL 5.2     Lymph# 0.7 - 4.0 K/uL 1.0        There is no height or weight on file to calculate BMI.  Orders:  No orders of the defined types were placed in this encounter.  No orders of the defined types were placed in this encounter.    Procedures: No procedures performed  Clinical Data: No additional findings.  ROS:  All other systems negative, except as noted in the HPI. Review of Systems  Objective: Vital Signs: There were no vitals taken for this visit.  Specialty Comments:  No specialty comments available.  PMFS History: Patient Active Problem List   Diagnosis Date Noted   Atrial flutter (HCC) 04/07/2023   Advanced care planning/counseling discussion 02/18/2023   Anxiety 02/18/2023   Lung cancer (HCC) 01/11/2023   Adenocarcinoma of upper lobe of right lung (HCC) 01/11/2023   Hypercoagulable state due to persistent atrial fibrillation (HCC) 12/27/2022   Bilateral hydronephrosis 12/11/2022   CAD (coronary artery disease) 12/09/2022   History of recurrent UTI (urinary tract infection) 12/08/2022   Hypoxia 12/08/2022   COPD (chronic obstructive pulmonary disease) (HCC)  06/26/2020   Chronic kidney disease, 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   Chronic respiratory failure (HCC) 08/20/2019   Acute on chronic diastolic CHF (congestive heart failure) (HCC) 12/15/2018   HLD (hyperlipidemia) 12/15/2018   Status post transmetatarsal amputation of left foot (HCC)    Benign prostatic hyperplasia with urinary retention    Cerebrovascular accident (CVA) (HCC)    Hypertension    Hypokalemia    Essential hypertension 03/20/2018   Diabetes (HCC) 03/20/2018   A-fib (HCC) 03/20/2018   H/O ulcerative colitis 1993   Past Medical History:  Diagnosis Date   A-fib (HCC) 03/20/2018   Acute blood loss anemia    Anxiety    Benign prostatic hyperplasia with urinary retention    Cerebrovascular accident (CVA) (HCC)    CHF (congestive heart failure) (HCC)    COPD (chronic obstructive pulmonary disease) (HCC)    Diabetes mellitus type 2 in nonobese (HCC)    Dyspnea    Dysrhythmia    A-Fib   Essential hypertension 03/20/2018   Former tobacco use    H/O ulcerative colitis 1993   Leukocytosis    Lung cancer (HCC)    Osteomyelitis (HCC)    a. 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   PAF (paroxysmal atrial fibrillation) (HCC)    a. dx 03/2018 in setting of stroke, osteomyelitis.   Stage 3 chronic kidney disease (HCC)    pt/family unaware   Status post transmetatarsal amputation of left foot (HCC)    Umbilical hernia     Family History  Problem Relation Age of Onset   Hypertension Father     Past Surgical History:  Procedure Laterality Date   AMPUTATION Left 03/24/2018   Procedure: Transmetatarsal Amputation Left Foot;  Surgeon: Nadara Mustard, MD;  Location: [facility] OR;  Service: Orthopedics;  Laterality: Left;   BRONCHIAL BIOPSY  01/03/2023   Procedure: BRONCHIAL BIOPSIES;  Surgeon: Leslye Peer, MD;  Location: [facility] ENDOSCOPY;  Service: Pulmonary;;   BRONCHIAL BRUSHINGS  01/03/2023   Procedure: BRONCHIAL BRUSHINGS;  Surgeon: Leslye Peer, MD;  Location: [facility] ENDOSCOPY;  Service:  Pulmonary;;   BRONCHIAL NEEDLE ASPIRATION BIOPSY  01/03/2023   Procedure: BRONCHIAL NEEDLE ASPIRATION BIOPSIES;  Surgeon: Leslye Peer, MD;  Location: [facility] ENDOSCOPY;  Service: Pulmonary;;   CARDIOVERSION N/A 05/05/2018   Procedure: CARDIOVERSION;  Surgeon: Chilton Si, MD;  Location: [facility] ENDOSCOPY;  Service: Cardiovascular;  Laterality: N/A;   CARDIOVERSION N/A 12/02/2022   Procedure: CARDIOVERSION;  Surgeon: Jodelle Red, MD;  Location: [facility] ENDOSCOPY;  Service: Cardiovascular;  Laterality: N/A;   CARDIOVERSION N/A 04/12/2023   Procedure: CARDIOVERSION;  Surgeon: Thurmon Fair, MD;  Location: [facility] INVASIVE CV LAB;  Service: Cardiovascular;  Laterality: N/A;   COLONOSCOPY  12/2017   benign, remission from ulcerative colitis in 90s   FIDUCIAL MARKER PLACEMENT  01/03/2023   Procedure: FIDUCIAL MARKER PLACEMENT;  Surgeon: Leslye Peer, MD;  Location: [facility] ENDOSCOPY;  Service: Pulmonary;;   I & D EXTREMITY Left 03/17/2018   Procedure: IRRIGATION AND DEBRIDEMENT FOOT;  Surgeon: Yolonda Kida, MD;  Location: [facility] OR;  Service: Orthopedics;  Laterality: Left;   STUMP REVISION Left 05/10/2018   Procedure: REVISION LEFT TRANSMETATARSAL AMPUTATION;  Surgeon: Nadara Mustard, MD;  Location: [facility] OR;  Service: Orthopedics;  Laterality: Left;   VIDEO BRONCHOSCOPY WITH RADIAL ENDOBRONCHIAL ULTRASOUND  01/03/2023   Procedure: VIDEO BRONCHOSCOPY WITH RADIAL ENDOBRONCHIAL ULTRASOUND;  Surgeon: Leslye Peer, MD;  Location: [facility] ENDOSCOPY;  Service: Pulmonary;;   Social History   Occupational History   Not on file  Tobacco Use   Smoking status: Former    Current packs/day: 0.00    Average packs/day: 1 pack/day for 22.0 years (22.0 ttl pk-yrs)    Types: Cigarettes    Start date: 1963    Quit date: 1    Years since quitting: 39.6   Smokeless tobacco: Never   Tobacco comments:    Former smoker 40 yrs ago  Vaping Use   Vaping status: Never Used  Substance and Sexual Activity   Alcohol use:  Yes    Alcohol/week: 6.0 standard drinks of alcohol    Types: 6 Cans of beer per week    Comment: 6 pack every weekend 01/26/23   Drug use: Never   Sexual activity: Not on file

## 2023-05-09 ENCOUNTER — Ambulatory Visit: Payer: Medicare Other | Admitting: Orthopedic Surgery

## 2023-05-09 ENCOUNTER — Encounter: Payer: Self-pay | Admitting: Orthopedic Surgery

## 2023-05-09 ENCOUNTER — Ambulatory Visit
Admission: RE | Admit: 2023-05-09 | Discharge: 2023-05-09 | Disposition: A | Discharge status: Home / Self Care | Payer: Medicare Other | Source: Ambulatory Visit | Attending: Radiation Oncology | Admitting: Radiation Oncology

## 2023-05-09 DIAGNOSIS — L97521 Non-pressure chronic ulcer of other part of left foot limited to breakdown of skin: Secondary | ICD-10-CM

## 2023-05-09 DIAGNOSIS — Z89432 Acquired absence of left foot: Secondary | ICD-10-CM

## 2023-05-09 NOTE — Progress Notes (Signed)
  Radiation Oncology         [phone]) [phone] ________________________________  Name: Kevin [name] MRN: [id number]  Date of Service: 05/09/2023  DOB: 1941/05/17  Post Treatment Telephone Note  Diagnosis:  Stage IA2, cT1bN0M0, NSCLC, adenocarcinoma of the RUL with Synchronous Stage IA2, cT1bN0M0, NSCLC NOS of the LUL. (as documented in provider EOT note)  The patient was not available for call today. Voicemail left.  The patient has scheduled follow up with his medical oncologist Dr. Arbutus Ped for ongoing care, and was encouraged to call if he  develops concerns or questions regarding radiation.     [name], LPN

## 2023-05-09 NOTE — Progress Notes (Signed)
Office Visit Note   Patient: Kevin [name]           Date of Birth: 1941/02/10           MRN: [id number] Visit Date: 05/09/2023              Requested by: [name], MD [address] [place],  [place] [address] PCP: [name], MD  Chief Complaint  Patient presents with   Left Foot - Wound Check      HPI: Patient is a 82 year old gentleman status post transmetatarsal amputation the left with a plantar Wagner grade 1 ulcer.  Assessment & Plan: Visit Diagnoses:  1. Non-pressure chronic ulcer of other part of left foot limited to breakdown of skin (HCC)   2. History of transmetatarsal amputation of left foot (HCC)     Plan: Continue with the protective shoe wear Dial soap cleansing antibiotic ointment and a Band-Aid. Patient has a follow-up appointment with [facility]  Follow-Up Instructions: Return in about 4 weeks (around 06/06/2023).   Ortho Exam  Patient is alert, oriented, no adenopathy, well-dressed, normal affect, normal respiratory effort. Examination the ulcer left foot measures 1 cm in diameter there is healthy granulation tissue the ulcer was debrided with a 10 blade knife without complications.  A Band-Aid was applied silver nitrate was used for hemostasis.  Imaging: No results found. No images are attached to the encounter.  Labs: Lab Results  Component Value Date   HGBA1C 7.3 (A) 02/18/2023   HGBA1C 10.2 (H) 12/08/2022   HGBA1C 8.7 (H) 08/20/2019   ESRSEDRATE 28 (H) 12/12/2022   CRP 3.7 (H) 12/12/2022   CRP 0.8 08/24/2019   CRP <0.8 08/23/2019   REPTSTATUS 12/12/2022 FINAL 12/10/2022   GRAMSTAIN  03/17/2018    MODERATE WBC PRESENT, PREDOMINANTLY PMN MODERATE GRAM POSITIVE COCCI FEW GRAM NEGATIVE RODS    CULT  12/10/2022    NO GROWTH Performed at [facility], [address] [place], [place] [address]    Imelda Pillow ENTEROCOCCUS FAECALIS (A) 08/20/2019     Lab Results  Component Value Date   ALBUMIN 3.5 04/06/2023    ALBUMIN 2.7 (L) 12/13/2022   ALBUMIN 2.7 (L) 12/12/2022    Lab Results  Component Value Date   MG 1.8 04/06/2023   MG 2.2 02/23/2023   MG 2.0 02/11/2023   No results found for: "VD25OH"  No results found for: "PREALBUMIN"    Latest Ref Rng & Units 04/06/2023   10:11 AM 02/23/2023    4:57 PM 02/10/2023    3:04 AM  CBC EXTENDED  WBC 4.0 - 10.5 K/uL 7.4  8.6  8.2   RBC 4.22 - 5.81 MIL/uL 4.10  3.98  3.85   Hemoglobin 13.0 - 17.0 g/dL 72.5  36.6  44.0   HCT 39.0 - 52.0 % 38.2  38.5  35.8   Platelets 150 - 400 K/uL 273  250  207   NEUT# 1.7 - 7.7 K/uL 5.2     Lymph# 0.7 - 4.0 K/uL 1.0        There is no height or weight on file to calculate BMI.  Orders:  No orders of the defined types were placed in this encounter.  No orders of the defined types were placed in this encounter.    Procedures: No procedures performed  Clinical Data: No additional findings.  ROS:  All other systems negative, except as noted in the HPI. Review of Systems  Objective: Vital Signs: There  were no vitals taken for this visit.  Specialty Comments:  No specialty comments available.  PMFS History: Patient Active Problem List   Diagnosis Date Noted   Atrial flutter (HCC) 04/07/2023   Advanced care planning/counseling discussion 02/18/2023   Anxiety 02/18/2023   Lung cancer (HCC) 01/11/2023   Adenocarcinoma of upper lobe of right lung (HCC) 01/11/2023   Hypercoagulable state due to persistent atrial fibrillation (HCC) 12/27/2022   Bilateral hydronephrosis 12/11/2022   CAD (coronary artery disease) 12/09/2022   History of recurrent UTI (urinary tract infection) 12/08/2022   Hypoxia 12/08/2022   COPD (chronic obstructive pulmonary disease) (HCC) 06/26/2020   Chronic kidney disease, 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   Chronic respiratory failure (HCC) 08/20/2019   Acute on chronic diastolic CHF (congestive heart failure) (HCC) 12/15/2018   HLD (hyperlipidemia) 12/15/2018   Status post  transmetatarsal amputation of left foot (HCC)    Benign prostatic hyperplasia with urinary retention    Cerebrovascular accident (CVA) (HCC)    Hypertension    Hypokalemia    Essential hypertension 03/20/2018   Diabetes (HCC) 03/20/2018   A-fib (HCC) 03/20/2018   H/O ulcerative colitis 1993   Past Medical History:  Diagnosis Date   A-fib (HCC) 03/20/2018   Acute blood loss anemia    Anxiety    Benign prostatic hyperplasia with urinary retention    Cerebrovascular accident (CVA) (HCC)    CHF (congestive heart failure) (HCC)    COPD (chronic obstructive pulmonary disease) (HCC)    Diabetes mellitus type 2 in nonobese (HCC)    Dyspnea    Dysrhythmia    A-Fib   Essential hypertension 03/20/2018   Former tobacco use    H/O ulcerative colitis 1993   Leukocytosis    Lung cancer (HCC)    Osteomyelitis (HCC)    a. 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   PAF (paroxysmal atrial fibrillation) (HCC)    a. dx 03/2018 in setting of stroke, osteomyelitis.   Stage 3 chronic kidney disease (HCC)    pt/family unaware   Status post transmetatarsal amputation of left foot (HCC)    Umbilical hernia     Family History  Problem Relation Age of Onset   Hypertension Father     Past Surgical History:  Procedure Laterality Date   AMPUTATION Left 03/24/2018   Procedure: Transmetatarsal Amputation Left Foot;  Surgeon: Nadara Mustard, MD;  Location: [facility] OR;  Service: Orthopedics;  Laterality: Left;   BRONCHIAL BIOPSY  01/03/2023   Procedure: BRONCHIAL BIOPSIES;  Surgeon: Leslye Peer, MD;  Location: [facility] ENDOSCOPY;  Service: Pulmonary;;   BRONCHIAL BRUSHINGS  01/03/2023   Procedure: BRONCHIAL BRUSHINGS;  Surgeon: Leslye Peer, MD;  Location: [facility] ENDOSCOPY;  Service: Pulmonary;;   BRONCHIAL NEEDLE ASPIRATION BIOPSY  01/03/2023   Procedure: BRONCHIAL NEEDLE ASPIRATION BIOPSIES;  Surgeon: Leslye Peer, MD;  Location: [facility] ENDOSCOPY;  Service: Pulmonary;;   CARDIOVERSION N/A 05/05/2018   Procedure:  CARDIOVERSION;  Surgeon: Chilton Si, MD;  Location: [facility] ENDOSCOPY;  Service: Cardiovascular;  Laterality: N/A;   CARDIOVERSION N/A 12/02/2022   Procedure: CARDIOVERSION;  Surgeon: Jodelle Red, MD;  Location: [facility] ENDOSCOPY;  Service: Cardiovascular;  Laterality: N/A;   CARDIOVERSION N/A 04/12/2023   Procedure: CARDIOVERSION;  Surgeon: Thurmon Fair, MD;  Location: [facility]VAS[facility] CV LAB;  Service: Cardiovascular;  Laterality: N/A;   COLONOSCOPY  12/2017   benign, remission from ulcerative colitis in 90s   FIDUCIAL MARKER PLACEMENT  01/03/2023   Procedure: FIDUCIAL MARKER PLACEMENT;  Surgeon: Leslye Peer, MD;  Location: [facility] ENDOSCOPY;  Service: Pulmonary;;   I & D EXTREMITY Left 03/17/2018   Procedure: IRRIGATION AND DEBRIDEMENT FOOT;  Surgeon: Yolonda Kida, MD;  Location: [facility] OR;  Service: Orthopedics;  Laterality: Left;   STUMP REVISION Left 05/10/2018   Procedure: REVISION LEFT TRANSMETATARSAL AMPUTATION;  Surgeon: Nadara Mustard, MD;  Location: [facility] OR;  Service: Orthopedics;  Laterality: Left;   VIDEO BRONCHOSCOPY WITH RADIAL ENDOBRONCHIAL ULTRASOUND  01/03/2023   Procedure: VIDEO BRONCHOSCOPY WITH RADIAL ENDOBRONCHIAL ULTRASOUND;  Surgeon: Leslye Peer, MD;  Location: [facility] ENDOSCOPY;  Service: Pulmonary;;   Social History   Occupational History   Not on file  Tobacco Use   Smoking status: Former    Current packs/day: 0.00    Average packs/day: 1 pack/day for 22.0 years (22.0 ttl pk-yrs)    Types: Cigarettes    Start date: 1963    Quit date: 31    Years since quitting: 39.6   Smokeless tobacco: Never   Tobacco comments:    Former smoker 40 yrs ago  Vaping Use   Vaping status: Never Used  Substance and Sexual Activity   Alcohol use: Yes    Alcohol/week: 6.0 standard drinks of alcohol    Types: 6 Cans of beer per week    Comment: 6 pack every weekend 01/26/23   Drug use: Never   Sexual activity: Not on file

## 2023-05-16 ENCOUNTER — Other Ambulatory Visit: Payer: Medicare Other

## 2023-05-16 ENCOUNTER — Other Ambulatory Visit: Payer: Self-pay | Admitting: Student

## 2023-05-16 ENCOUNTER — Other Ambulatory Visit (HOSPITAL_COMMUNITY): Payer: Medicare Other

## 2023-05-16 DIAGNOSIS — F419 Anxiety disorder, unspecified: Secondary | ICD-10-CM

## 2023-05-17 ENCOUNTER — Other Ambulatory Visit: Payer: Self-pay | Admitting: Student

## 2023-05-17 DIAGNOSIS — F419 Anxiety disorder, unspecified: Secondary | ICD-10-CM

## 2023-05-17 NOTE — Telephone Encounter (Signed)
Patient's wife calls nurse line to make sure that we received refill request. Wife reports that patient only has one pill left.   Forwarding to PCP.   Veronda Prude, RN

## 2023-05-18 ENCOUNTER — Ambulatory Visit: Payer: Medicare Other | Admitting: Physician Assistant

## 2023-05-20 ENCOUNTER — Ambulatory Visit (INDEPENDENT_AMBULATORY_CARE_PROVIDER_SITE_OTHER): Payer: Medicare Other | Admitting: Emergency Medicine

## 2023-05-20 ENCOUNTER — Other Ambulatory Visit: Payer: Self-pay | Admitting: Cardiovascular Disease

## 2023-05-20 ENCOUNTER — Encounter: Payer: Self-pay | Admitting: Emergency Medicine

## 2023-05-20 VITALS — BP 110/60 | HR 55 | Ht 74.0 in | Wt 230.2 lb

## 2023-05-20 DIAGNOSIS — J301 Allergic rhinitis due to pollen: Secondary | ICD-10-CM | POA: Diagnosis not present

## 2023-05-20 DIAGNOSIS — J449 Chronic obstructive pulmonary disease, unspecified: Secondary | ICD-10-CM

## 2023-05-20 DIAGNOSIS — C3480 Malignant neoplasm of overlapping sites of unspecified bronchus and lung: Secondary | ICD-10-CM | POA: Diagnosis not present

## 2023-05-20 DIAGNOSIS — J439 Emphysema, unspecified: Secondary | ICD-10-CM | POA: Diagnosis not present

## 2023-05-20 MED ORDER — AZELASTINE HCL 0.1 % NA SOLN: 2.0000 | Freq: Two times a day (BID) | NASAL | 11 refills | Status: DC | PRN

## 2023-05-20 MED ORDER — ALBUTEROL SULFATE HFA 108 (90 BASE) MCG/ACT IN AERS
2.0000 | INHALATION_SPRAY | Freq: Four times a day (QID) | RESPIRATORY_TRACT | 3 refills | Status: DC | PRN
Start: 2023-05-20 — End: 2023-05-27

## 2023-05-20 NOTE — Assessment & Plan Note (Signed)
Please continue your Stiolto 2 puffs once daily. Try stopping the Incruse (it is probably redundant) Keep albuterol available to use 2 puffs up to every 4 hours if needed for shortness of breath, chest tightness, wheezing.  We will send a prescription for this today.  Agree with evaluation by cardiology for possible ablation of atrial fibrillation.  Your age, underlying lung disease, oxygen need to place you at high risk for general anesthesia.  This includes possible prolonged mechanical ventilation, prolonged hospitalization or even death.  That said, this does not exclude you from proceeding if all agree that the benefits outweigh these risks.  Any procedure should be delayed if you are having flaring symptoms of her COPD. Follow Dr. Delton Coombes in 6 months, sooner if you have problems.

## 2023-05-20 NOTE — Patient Instructions (Signed)
We repeated your walking oximetry today.  You need to wear your oxygen with all exertion, even light exertion.  Please use 3-4 L/min depending on your level of exertion.  Our goal is to keep your oxygen saturations > 90%. Please continue your Stiolto 2 puffs once daily. Try stopping the Incruse (it is probably redundant) Keep albuterol available to use 2 puffs up to every 4 hours if needed for shortness of breath, chest tightness, wheezing.  We will send a prescription for this today. Continue loratadine and Mucinex as you have been taking them Try starting azelastine nasal spray, 2 sprays each nostril 2-3 times daily if you need it for drainage and congestion We reviewed your CT scan of the chest today.  It is stable.  Continue to follow-up with radiation oncology Agree with evaluation by cardiology for possible ablation of atrial fibrillation.  Your age, underlying lung disease, oxygen need to place you at high risk for general anesthesia.  This includes possible prolonged mechanical ventilation, prolonged hospitalization or even death.  That said, this does not exclude you from proceeding if all agree that the benefits outweigh these risks.  Any procedure should be delayed if you are having flaring symptoms of her COPD. Follow Dr. Delton Coombes in 6 months, sooner if you have problems.

## 2023-05-20 NOTE — Progress Notes (Unsigned)
Subjective:    Patient ID: Kevin [name], male    DOB: April 18, 1941, 82 y.o.   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  Also with a history of hypertension with diastolic CHF, CKD stage IIIb, atrial fibrillation with DCCV on 3/7 (Eliquis), diabetes with a left foot ulcer.  His most recent outpatient CT chest 04/28/2022 showed a persistent 1.8 x 1.2 cm spiculated right upper lobe nodule, 2.2 x 0.8 cm elongated left upper lobe nodule.  Currently managed on Stiolto, uses oxygen 3 L/min with heavy exertion.  He had a repeat CT chest 11/03/2022 as below, then underwent another CT chest 12/08/2022 while he was admitted for acute on chronic hypoxemic respiratory failure, atrial fibrillation with RVR.  CT chest 11/03/2022 reviewed by me shows a continued increase in size of his spiculated right upper lobe pulmonary nodule now measuring 2.0 x 1.3 x 2.1 cm.  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  He underwent bronchoscopy 01/03/2023 to evaluate slowly enlarging upper lobe nodules.  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 He is on loratadine, Stiolto, uses albuterol about 1x a day. Daily cough, productive of clear.  Today he reports that he doing well after the bronchoscopy. Reliable with O2 at 3L/min. Better stamina and recovery time  MRI Brain 01/18/23 -- no evidence intracranial metastatic disease.   ROV 05/20/23 --82 year old man with history of COPD, UIP pattern on CT chest with some pulmonary nodular disease.  Also hypertension and atrial fibrillation (on  Eliquis) with diastolic CHF, CKD stage IIIb.  He was diagnosed earlier 2024 with bilateral synchronous adenocarcinomas, treated with SBRT.  He is being evaluated for possible A-fib ablation, planning to see Dr. Elberta Fortis. Currently managed on Stiolto, uses albuterol  Loratadine, scheduled Mucinex 600 twice daily, still has a lot of nasal gtt Walking oximetry today confirm desaturation after only short period of exertion.  Titrated to 4 L/min  Repeat CT scan of the chest 80/06/2014 reviewed by me showed stable to decreased size of treated right upper lobe lesion,, stable appearance subpleural nodules of left upper lobe and left lower lobe.  Stable small right pleural effusion.  Unchanged bilateral adrenal gland adenomas   Review of Systems As per HPI      Objective:   Physical Exam  Today's Vitals   05/20/23 0828  BP: 110/60  Pulse: (!) 55  SpO2: 91%  Weight: 230 lb 3.2 oz (104.4 kg)  Height: 6\' 2"  (1.88 m)   Body mass index is 29.56 kg/m.;sm  Gen: Pleasant, well-nourished, in no distress,  normal affect  ENT: No lesions,  mouth clear,  oropharynx clear, no postnasal drip  Neck: No JVD, no stridor  Lungs: No use of accessory muscles, no crackles or wheezing on normal respiration, no wheeze on forced expiration  Cardiovascular: RRR, heart sounds normal, no murmur or gallops, no peripheral edema  Musculoskeletal: No deformities, no cyanosis or clubbing  Neuro: alert, awake, non focal  Skin: Warm, no lesions or rash     Assessment & Plan:  No problem-specific Assessment & Plan notes found for this encounter.  Levy Pupa, MD, PhD 05/20/2023, 8:49 AM [place] [facility] [phone] or if no answer before 7:00PM call [phone] For any issues after 7:00PM please call eLink [phone]

## 2023-05-22 NOTE — Progress Notes (Unsigned)
Cardiology Clinic Note   Patient Name: Markale Finner Date of Encounter: 05/24/2023  Primary Care Provider:  [name], MD Primary Cardiologist:  Verne Carrow, MD  Patient Profile    [name] 82 year old male presents to the clinic today for follow-up evaluation of his chronic diastolic CHF, PAF, and preoperative cardiac evaluation.  Past Medical History    Past Medical History:  Diagnosis Date   A-fib (HCC) 03/20/2018   Acute blood loss anemia    Anxiety    Benign prostatic hyperplasia with urinary retention    Cerebrovascular accident (CVA) (HCC)    CHF (congestive heart failure) (HCC)    COPD (chronic obstructive pulmonary disease) (HCC)    Diabetes mellitus type 2 in nonobese (HCC)    Dyspnea    Dysrhythmia    A-Fib   Essential hypertension 03/20/2018   Former tobacco use    H/O ulcerative colitis 1993   Leukocytosis    Lung cancer (HCC)    Osteomyelitis (HCC)    a. 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   PAF (paroxysmal atrial fibrillation) (HCC)    a. dx 03/2018 in setting of stroke, osteomyelitis.   Stage 3 chronic kidney disease (HCC)    pt/family unaware   Status post transmetatarsal amputation of left foot [facility])    Umbilical hernia    Past Surgical History:  Procedure Laterality Date   AMPUTATION Left 03/24/2018   Procedure: Transmetatarsal Amputation Left Foot;  Surgeon: Nadara Mustard, MD;  Location: [facility] OR;  Service: Orthopedics;  Laterality: Left;   BRONCHIAL BIOPSY  01/03/2023   Procedure: BRONCHIAL BIOPSIES;  Surgeon: Leslye Peer, MD;  Location: [facility] ENDOSCOPY;  Service: Pulmonary;;   BRONCHIAL BRUSHINGS  01/03/2023   Procedure: BRONCHIAL BRUSHINGS;  Surgeon: Leslye Peer, MD;  Location: [facility] ENDOSCOPY;  Service: Pulmonary;;   BRONCHIAL NEEDLE ASPIRATION BIOPSY  01/03/2023   Procedure: BRONCHIAL NEEDLE ASPIRATION BIOPSIES;  Surgeon: Leslye Peer, MD;  Location: [facility] ENDOSCOPY;  Service: Pulmonary;;   CARDIOVERSION N/A  05/05/2018   Procedure: CARDIOVERSION;  Surgeon: Chilton Si, MD;  Location: [facility] ENDOSCOPY;  Service: Cardiovascular;  Laterality: N/A;   CARDIOVERSION N/A 12/02/2022   Procedure: CARDIOVERSION;  Surgeon: Jodelle Red, MD;  Location: [facility] ENDOSCOPY;  Service: Cardiovascular;  Laterality: N/A;   CARDIOVERSION N/A 04/12/2023   Procedure: CARDIOVERSION;  Surgeon: Thurmon Fair, MD;  Location: [facility] INVASIVE CV LAB;  Service: Cardiovascular;  Laterality: N/A;   COLONOSCOPY  12/2017   benign, remission from ulcerative colitis in 90s   FIDUCIAL MARKER PLACEMENT  01/03/2023   Procedure: FIDUCIAL MARKER PLACEMENT;  Surgeon: Leslye Peer, MD;  Location: [facility] ENDOSCOPY;  Service: Pulmonary;;   I & D EXTREMITY Left 03/17/2018   Procedure: IRRIGATION AND DEBRIDEMENT FOOT;  Surgeon: Yolonda Kida, MD;  Location: [facility] OR;  Service: Orthopedics;  Laterality: Left;   STUMP REVISION Left 05/10/2018   Procedure: REVISION LEFT TRANSMETATARSAL AMPUTATION;  Surgeon: Nadara Mustard, MD;  Location: [facility] OR;  Service: Orthopedics;  Laterality: Left;   VIDEO BRONCHOSCOPY WITH RADIAL ENDOBRONCHIAL ULTRASOUND  01/03/2023   Procedure: VIDEO BRONCHOSCOPY WITH RADIAL ENDOBRONCHIAL ULTRASOUND;  Surgeon: Leslye Peer, MD;  Location: [facility] ENDOSCOPY;  Service: Pulmonary;;    Allergies  Allergies  Allergen Reactions   Effexor [Venlafaxine] Other (See Comments)    psychosis and hallucinations   Lipitor [Atorvastatin] Other (See Comments)    Memory loss   Other     Per spouse, pt needs to avoid any abx that could  potentially cause prolonged QT syndrome    History of Present Illness    Damorian Rozzi has a PMH of HTN, PAF, type 2 diabetes, chronic diastolic CHF, COVID-pneumonia, chronic respiratory failure, COPD, left distal radius fracture, BPH, UTI, CKD stage IIIb, tachycardia, HLD, amputation, CVA, and ulcerative colitis.  He was admitted in June 2019 with CVA and received tPA.  He was noted to be in atrial  fibrillation during his hospitalization.  He became septic from his left foot and required transmetatarsal amputation.  He was started on apixaban.  He was converted to sinus rhythm 8/19.  His echocardiogram 6/19 showed normal LV function.  Nuclear stress testing 7/21 showed no ischemia.  His echocardiogram 7/21 showed an LVEF of 55% and no significant valvular abnormality.  He followed up with Dr. Clifton James 11/18/2021.  During that time he denied chest pain dyspnea and palpitations.  He denied lower extremity swelling, orthopnea and PND.  He denied near syncope and syncope.  His blood pressure was well-controlled.  Follow-up was planned for 12 months.  He presented to the clinic 11/25/22 for follow-up evaluation stated he felt somewhat short of breath.  He had been working on being more active.  He had been using his treadmill and reported he had been walking slow.  He was planning to travel to [facility] in April for a few weeks.  His EKG showed atrial fibrillation left axis deviation right bundle branch block 74 bpm.  Case was reviewed with Dr. Rennis Golden and patient wished to proceed with DCCV.  I  ordered CBC, BMP, scheduled cardioversion and planned him follow-up after the procedure.  We reviewed the importance of postponing his colonoscopy and continuing on anticoagulation for at least 4 weeks post DCCV.  He expressed understanding.  He underwent successful DCCV on 12/02/2022.  He received 2 shocks.  He was admitted on 12/07/2022 until 12/13/2022 with acute on chronic respiratory failure in the setting of COPD and CHF exacerbation and atrial fibrillation.  CHA2DS2-VASc score 8.  He was continued on apixaban.  He continued to follow-up with the A-fib clinic.  He was last seen by A-fib clinic on 04/26/2023.  He was in atrial flutter.  He had undergone successful DCCV 04/12/2023.  He reported being tired and out of breath when he was out of rhythm.  He was using nighttime oxygen and using oxygen intermittently throughout  the day.  He is in remission from lung cancer.  He did not miss doses of fatigue is send and apixaban.  He was tolerating his medications well.  It was felt that he would not be a good candidate for amiodarone therapy.  He was referred to EP for possible ablation.  He presents to the clinic today for follow-up evaluation and states he plans to get back on the treadmill to do some walking this week.  We reviewed his cardioversion procedures and plan for ablation.  We reviewed his medications.  His blood pressure today is 102/46.  His pulse is 69.  He has been using 3 L via nasal cannula.  He was also recently seen by pulmonology who prescribed nasal spray to reduce nasal inflammation.  We reviewed his upcoming colonoscopy and follow-up cardiology appointments.  I will continue his current medications and have him follow-up as scheduled..  Today he denies chest pain, increased shortness of breath, lower extremity edema, fatigue, palpitations, melena, hematuria, hemoptysis, diaphoresis, weakness, presyncope, syncope, orthopnea, and PND.         Home Medications  Prior to Admission medications   Medication Sig Start Date End Date Taking? Authorizing Provider  albuterol (VENTOLIN HFA) 108 (90 Base) MCG/ACT inhaler Inhale 2 puffs into the lungs every 6 (six) hours as needed for wheezing or shortness of breath. 06/03/20   Kalman Shan, MD  apixaban (ELIQUIS) 5 MG TABS tablet Take 1 tablet (5 mg total) by mouth 2 (two) times daily. 07/14/22   Kathleene Hazel, MD  diltiazem (CARDIZEM CD) 120 MG 24 hr capsule Take 1 capsule (120 mg total) by mouth daily. 10/27/22   Kathleene Hazel, MD  doxycycline (VIBRA-TABS) 100 MG tablet Take 1 tablet (100 mg total) by mouth 2 (two) times daily. 08/13/21   Nadara Mustard, MD  empagliflozin (JARDIANCE) 25 MG TABS tablet Take 25 mg by mouth daily.    [provider]  escitalopram (LEXAPRO) 10 MG tablet 1 tablet 04/06/22   [provider]  FLUoxetine (PROZAC) 20 MG capsule Take 20 mg by mouth daily. Patient not taking: Reported on 05/18/2022    [provider]  furosemide (LASIX) 20 MG tablet TAKE 1 TABLET DAILY. YOU   MAY TAKE AN EXTRA 20MG      TABLET FOR INCREASED       SWELLING OR WEIGHT GAIN 07/14/22   Kathleene Hazel, MD  insulin glargine (LANTUS) 100 UNIT/ML injection Inject 38 Units into the skin 2 (two) times daily.    [provider]  mesalamine (LIALDA) 1.2 g EC tablet Take 4.8 g by mouth daily. 05/07/20   [provider]  metoprolol succinate (TOPROL XL) 50 MG 24 hr tablet TAKE 1 TABLET DAILY, WITH  OR IMMEDIATELY FOLLOWING A MEAL. 10/25/22   Kathleene Hazel, MD  Multiple Vitamin (MULTIVITAMIN WITH MINERALS) TABS tablet Take 1 tablet by mouth daily.    [provider]  rosuvastatin (CRESTOR) 10 MG tablet Take 0.5 tablets (5 mg total) by mouth daily. 10/30/21   Kathleene Hazel, MD  tamsulosin (FLOMAX) 0.4 MG CAPS capsule Take 1 capsule (0.4 mg total) by mouth 2 times daily at 12 noon and 4 pm. 03/31/18   Love, Evlyn Kanner, PA-C  Tiotropium Bromide-Olodaterol (STIOLTO RESPIMAT) 2.5-2.5 MCG/ACT AERS Inhale 2 puffs into the lungs daily. 10/30/21   Parrett, Virgel Bouquet, NP  trimethoprim (TRIMPEX) 100 MG tablet Take 100 mg by mouth at bedtime.  08/02/18   [provider]    Family History    Family History  Problem Relation Age of Onset   Hypertension Father    He indicated that his mother is deceased. He indicated that his father is deceased. He indicated that his maternal grandmother is deceased. He indicated that his maternal grandfather is deceased. He indicated that his paternal grandmother is deceased. He indicated that his paternal grandfather is deceased.  Social History    Social History   Socioeconomic History   Marital status: Married    Spouse name: Not on file   Number of children: Not on file   Years of education: Not on file   Highest  education level: Bachelor's degree (e.g., BA, AB, BS)  Occupational History   Not on file  Tobacco Use   Smoking status: Former    Current packs/day: 0.00    Average packs/day: 1 pack/day for 22.0 years (22.0 ttl pk-yrs)    Types: Cigarettes    Start date: 67    Quit date: 72    Years since quitting: 39.6   Smokeless tobacco: Never   Tobacco comments:  Former smoker 40 yrs ago  Vaping Use   Vaping status: Never Used  Substance and Sexual Activity   Alcohol use: Yes    Alcohol/week: 6.0 standard drinks of alcohol    Types: 6 Cans of beer per week    Comment: 6 pack every weekend 01/26/23   Drug use: Never   Sexual activity: Not on file  Other Topics Concern   Not on file  Social History Narrative   Lives at home with his wife and two cats. Married for almost 45 years   Right handed   Caffeine: 1 cup every morning   Social Determinants of Health   Financial Resource Strain: Low Risk  (03/11/2023)   Overall Financial Resource Strain (CARDIA)    Difficulty of Paying Living Expenses: Not hard at all  Food Insecurity: No Food Insecurity (03/11/2023)   Hunger Vital Sign    Worried About Running Out of Food in the Last Year: Never true    Ran Out of Food in the Last Year: Never true  Transportation Needs: No Transportation Needs (03/11/2023)   PRAPARE - Administrator, Civil Service (Medical): No    Lack of Transportation (Non-Medical): No  Physical Activity: Inactive (03/11/2023)   Exercise Vital Sign    Days of Exercise per Week: 0 days    Minutes of Exercise per Session: 0 min  Stress: Stress Concern Present (03/11/2023)   [employer] of Occupational Health - Occupational Stress Questionnaire    Feeling of Stress : To some extent  Social Connections: Moderately Isolated (03/11/2023)   Social Connection and Isolation Panel [NHANES]    Frequency of Communication with Friends and Family: More than three times a week    Frequency of Social Gatherings with  Friends and Family: Three times a week    Attends Religious Services: Never    Active Member of Clubs or Organizations: No    Attends Banker Meetings: Never    Marital Status: Married  Catering manager Violence: Not At Risk (03/11/2023)   Humiliation, Afraid, Rape, and Kick questionnaire    Fear of Current or Ex-Partner: No    Emotionally Abused: No    Physically Abused: No    Sexually Abused: No     Review of Systems    General:  No chills, fever, night sweats or weight changes.  Cardiovascular:  No chest pain, dyspnea on exertion, edema, orthopnea, palpitations, paroxysmal nocturnal dyspnea. Dermatological: No rash, lesions/masses Respiratory: No cough, dyspnea Urologic: No hematuria, dysuria Abdominal:   No nausea, vomiting, diarrhea, bright red blood per rectum, melena, or hematemesis Neurologic:  No visual changes, wkns, changes in mental status. All other systems reviewed and are otherwise negative except as noted above.  Physical Exam    VS:  BP (!) 102/46   Pulse 69   Ht 6\' 2"  (1.88 m)   Wt 226 lb (102.5 kg)   SpO2 (!) 86%   BMI 29.02 kg/m  , BMI Body mass index is 29.02 kg/m. GEN: Well nourished, well developed, in no acute distress. HEENT: normal. Neck: Supple, no JVD, carotid bruits, or masses. Cardiac: Irregularly irregular, no murmurs, rubs, or gallops. No clubbing, cyanosis, edema.  Radials/DP/PT 2+ and equal bilaterally.  Respiratory:  Respirations regular and unlabored, clear to auscultation bilaterally. GI: Soft, nontender, nondistended, BS + x 4. MS: no deformity or atrophy. Skin: warm and dry, no rash. Neuro:  Strength and sensation are intact. Psych: Normal affect.  Accessory Clinical Findings  Recent Labs: 12/07/2022: B Natriuretic Peptide 475.5 04/06/2023: ALT 11; BUN 27; Creatinine 1.56; Hemoglobin 12.3; Magnesium 1.8; Platelet Count 273; Potassium 4.6; Sodium 137   Recent Lipid Panel    Component Value Date/Time   CHOL 94  12/12/2022 0223   TRIG 113 12/12/2022 0223   HDL 28 (L) 12/12/2022 0223   CHOLHDL 3.4 12/12/2022 0223   VLDL 23 12/12/2022 0223   LDLCALC 43 12/12/2022 0223         ECG personally reviewed by me today-none today. EKG 11/25/2022 atrial fibrillation left axis deviation right bundle branch block 74 bpm-   Echocardiogram 04/24/2020  IMPRESSIONS     1. Abnormal septal motion . Left ventricular ejection fraction, by  estimation, is 55%. The left ventricle has low normal function. The left  ventricle has no regional wall motion abnormalities. The left ventricular  internal cavity size was mildly  dilated. There is mild left ventricular hypertrophy. Left ventricular  diastolic parameters were normal.   2. Right ventricular systolic function is normal. The right ventricular  size is normal.   3. Left atrial size was moderately dilated.   4. The mitral valve is normal in structure. Trivial mitral valve  regurgitation. No evidence of mitral stenosis.   5. The aortic valve is normal in structure. Aortic valve regurgitation is  not visualized. Mild aortic valve sclerosis is present, with no evidence  of aortic valve stenosis.   6. The inferior vena cava is normal in size with greater than 50%  respiratory variability, suggesting right atrial pressure of 3 mmHg.   FINDINGS   Left Ventricle: Abnormal septal motion. Left ventricular ejection  fraction, by estimation, is 55%. The left ventricle has low normal  function. The left ventricle has no regional wall motion abnormalities.  The left ventricular internal cavity size was  mildly dilated. There is mild left ventricular hypertrophy. Left  ventricular diastolic parameters were normal.   Right Ventricle: The right ventricular size is normal. No increase in  right ventricular wall thickness. Right ventricular systolic function is  normal.   Left Atrium: Left atrial size was moderately dilated.   Right Atrium: Right atrial size was  normal in size.   Pericardium: There is no evidence of pericardial effusion.   Mitral Valve: The mitral valve is normal in structure. There is mild  thickening of the mitral valve leaflet(s). There is mild calcification of  the mitral valve leaflet(s). Normal mobility of the mitral valve leaflets.  Trivial mitral valve regurgitation.  No evidence of mitral valve stenosis.   Tricuspid Valve: The tricuspid valve is normal in structure. Tricuspid  valve regurgitation is not demonstrated. No evidence of tricuspid  stenosis.   Aortic Valve: The aortic valve is normal in structure. Aortic valve  regurgitation is not visualized. Mild aortic valve sclerosis is present,  with no evidence of aortic valve stenosis.   Pulmonic Valve: The pulmonic valve was normal in structure. Pulmonic valve  regurgitation is trivial. No evidence of pulmonic stenosis.   Aorta: The aortic root is normal in size and structure.   Venous: The inferior vena cava is normal in size with greater than 50%  respiratory variability, suggesting right atrial pressure of 3 mmHg.   IAS/Shunts: No atrial level shunt detected by color flow Doppler.    Assessment & Plan   1.  Persistent atrial fibrillation-heart rate today 69 bpm.  Continues to be compliant with apixaban and denies bleeding issues.  Compliant with Tikosyn CHA2DS2-VASc score 8.  Underwent DCCV  04/12/2023.  In follow-up with A-fib clinic was noted to be back in atrial flutter. Continue current medical therapy Avoid triggers caffeine, chocolate, EtOH, dehydration etc. Appointment scheduled with Dr. Elberta Fortis to discuss ablation therapy  Essential pretension-BP today 102/46 Continue metoprolol, diltiazem Heart healthy low-sodium high-fiber diet  Chronic diastolic CHF-  Euvolemic.  Weight 226 lbs.  Echocardiogram 04/24/2020 showed LVEF of 55% and no significant valvular abnormalities. Continue current medical therapy Daily weights  Preoperative cardiac  evaluation-colonoscopy, Dr. Marca Ancona, fax #[phone]  Primary Cardiologist: Verne Carrow, MD  Chart reviewed as part of pre-operative protocol coverage. Given past medical history and time since last visit, based on ACC/AHA guidelines, Ignacio Gianni would be at acceptable risk for the planned procedure without further cardiovascular testing.   Patient was advised that if he develops new symptoms prior to surgery to contact our office to arrange a follow-up appointment.  He verbalized understanding.  Patient with diagnosis of afib on Eliquis for anticoagulation.     Procedure: colonoscopy Date of procedure: 06/07/23     CHA2DS2-VASc Score = 8   This indicates a 10.8% annual risk of stroke. The patient's score is based upon: CHF History: 1 HTN History: 1 Diabetes History: 1 Stroke History: 2 Vascular Disease History: 1 Age Score: 2 Gender Score: 0       Patient had a cardioversion 04/12/23. His colonoscopy is scheduled more than 4 weeks after his cardioversion.    CrCl 57 ml/min   Per office protocol, patient can hold Eliquis for ideally 1 day prior to procedure.   Ideally patient hold Eliquis for 1 day prior to procedure due to hx of stroke. If MD is not ok with a 1 day hold, patient may hold 2 days prior.  I will route this recommendation to the requesting party via Epic fax function and remove from pre-op pool.    Disposition: Follow-up with Dr. Elberta Fortis as scheduled.  Thomasene Ripple. Concepcion Kirkpatrick NP-C     05/24/2023, 3:41 PM [facility] [address] Office [phone] Fax [phone]    I spent 14 minutes examining this patient, reviewing medications, and using patient centered shared decision making involving her cardiac care.  Prior to her visit I spent greater than 20 minutes reviewing her past medical history,  medications, and prior cardiac tests.

## 2023-05-23 DIAGNOSIS — J309 Allergic rhinitis, unspecified: Secondary | ICD-10-CM | POA: Insufficient documentation

## 2023-05-23 NOTE — Assessment & Plan Note (Signed)
Continue loratadine and Mucinex as you have been taking them Try starting azelastine nasal spray, 2 sprays each nostril 2-3 times daily if you need it for drainage and congestion

## 2023-05-23 NOTE — Assessment & Plan Note (Signed)
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  It is stable.  Continue to follow-up with radiation oncology

## 2023-05-24 ENCOUNTER — Encounter: Payer: Self-pay | Admitting: General Practice

## 2023-05-24 ENCOUNTER — Ambulatory Visit: Payer: Medicare Other | Admitting: General Practice

## 2023-05-24 VITALS — BP 102/46 | HR 69 | Ht 74.0 in | Wt 226.0 lb

## 2023-05-24 DIAGNOSIS — I10 Essential (primary) hypertension: Secondary | ICD-10-CM | POA: Insufficient documentation

## 2023-05-24 DIAGNOSIS — I5032 Chronic diastolic (congestive) heart failure: Secondary | ICD-10-CM | POA: Diagnosis not present

## 2023-05-24 DIAGNOSIS — Z01818 Encounter for other preprocedural examination: Secondary | ICD-10-CM | POA: Insufficient documentation

## 2023-05-24 DIAGNOSIS — I4819 Other persistent atrial fibrillation: Secondary | ICD-10-CM | POA: Insufficient documentation

## 2023-05-24 NOTE — Patient Instructions (Addendum)
Medication Instructions:  The current medical regimen is effective;  continue present plan and medications as directed. Please refer to the Current Medication list given to you today.  *If you need a refill on your cardiac medications before your next appointment, please call your pharmacy*  Lab Work: NONE If you have labs (blood work) drawn today and your tests are completely normal, you will receive your results only by: MyChart Message (if you have MyChart) OR A paper copy in the mail If you have any lab test that is abnormal or we need to change your treatment, we will call you to review the results.  Other Instructions OK FROM CARDIAC STANDPOINT FOR COLONOSCOPY   Follow-Up: At [facility], you and your health needs are our priority.  As part of our continuing mission to provide you with exceptional heart care, we have created designated Provider Care Teams.  These Care Teams include your primary Cardiologist (physician) and Advanced Practice Providers (APPs -  Physician Assistants and Nurse Practitioners) who all work together to provide you with the care you need, when you need it.  We recommend signing up for the patient portal called "MyChart".  Sign up information is provided on this After Visit Summary.  MyChart is used to connect with patients for Virtual Visits (Telemedicine).  Patients are able to view lab/test results, encounter notes, upcoming appointments, etc.  Non-urgent messages can be sent to your provider as well.   To learn more about what you can do with MyChart, go to [email or web address].    Your next appointment:   KEEP SCHEDULED APPOINTMENTS   Provider:   Verne Carrow, MD

## 2023-05-26 ENCOUNTER — Emergency Department (HOSPITAL_COMMUNITY)
Admission: EM | Admit: 2023-05-26 | Discharge: 2023-05-29 | Disposition: E | Payer: Medicare Other | Attending: Emergency Medicine | Admitting: Emergency Medicine

## 2023-05-26 DIAGNOSIS — Z794 Long term (current) use of insulin: Secondary | ICD-10-CM | POA: Diagnosis not present

## 2023-05-26 DIAGNOSIS — I499 Cardiac arrhythmia, unspecified: Secondary | ICD-10-CM | POA: Diagnosis not present

## 2023-05-26 DIAGNOSIS — I4510 Unspecified right bundle-branch block: Secondary | ICD-10-CM | POA: Diagnosis not present

## 2023-05-26 DIAGNOSIS — R739 Hyperglycemia, unspecified: Secondary | ICD-10-CM | POA: Diagnosis not present

## 2023-05-26 DIAGNOSIS — Z7901 Long term (current) use of anticoagulants: Secondary | ICD-10-CM | POA: Insufficient documentation

## 2023-05-26 DIAGNOSIS — I469 Cardiac arrest, cause unspecified: Secondary | ICD-10-CM | POA: Insufficient documentation

## 2023-05-26 DIAGNOSIS — I4891 Unspecified atrial fibrillation: Secondary | ICD-10-CM | POA: Diagnosis not present

## 2023-05-26 NOTE — ED Triage Notes (Addendum)
Pt BIB [facility] with a witness arrest. CPR in progress on arrival. EMS report pt got out of bed and then had a syncopal episode with convulsions. EMS started CPR at 10:18. They regained a pulse for 4 minutes and then pt arrested again with no ROSC. EMS has pt intubated and an IO in place. Pt had 6 rounds of Epi and an Epi drip with 1L of NaCl PTA. EMS also report of fentanyl and 5 mg of Midazolam for intubation.   On arrival pt was noted to be pulseless with PEA that quickly deteriorated into asystole. TOD was called by EDP at 11:42 am.

## 2023-05-26 NOTE — Progress Notes (Signed)
PT. Passed.  Supported family and Haematologist.  Venida Jarvis, [place], [facility], Pager [phone]

## 2023-05-26 NOTE — ED Notes (Signed)
Awaiting arrival of family.

## 2023-05-26 NOTE — ED Provider Notes (Signed)
[facility] AT [facility] Provider Note   CSN: [id number] Arrival date & time: 05/15/2023  1140     History  No chief complaint on file.   Kevin [name] is a 82 y.o. male.  82 year old male with medical history as detailed below presents for evaluation.  EMS transport home.  Patient with witnessed collapse at approximately 1005 to 1010.  EMS reports cardiac arrest beginning around 1018.  CPR has been in progress nearly continuously.  Wife reports that the patient used the toilet just prior to collapse.  He was straining to have a bowel movement.  As he was leaving the bathroom he collapsed.  He was apparently having facial weakness and difficulty speaking.  Shortly after Fire First responders arrived the patient became pulseless and CPR was initiated.  EMS reports more than 1 hour of resuscitation attempt.  6 rounds of epi given.  Epi drip in progress.  Patient with several brief episodes of ROSC but no pulse on arrival to the ED.  Rhythm has been PEA for the majority of resuscitation attempt.  No shockable rhythm observed.  The history is provided by the patient and medical records.       Home Medications Prior to Admission medications   Medication Sig Start Date End Date Taking? Authorizing Provider  acetaminophen (TYLENOL) 500 MG tablet Take 1,000 mg by mouth at bedtime as needed (pain.).    [provider]  albuterol (VENTOLIN HFA) 108 (90 Base) MCG/ACT inhaler Inhale 2 puffs into the lungs every 6 (six) hours as needed for wheezing or shortness of breath. 05/20/23   Byrum, Les Pou, MD  apixaban (ELIQUIS) 5 MG TABS tablet Take 1 tablet (5 mg total) by mouth 2 (two) times daily. Okay to restart this medication with your evening dose on 01/04/2023 01/03/23   Leslye Peer, MD  azelastine (ASTELIN) 0.1 % nasal spray Place 2 sprays into both nostrils every 12 (twelve) hours as needed for rhinitis. Use in each nostril as directed 05/20/23    Leslye Peer, MD  clonazePAM (KLONOPIN) 0.5 MG tablet TAKE 1 TABLET (0.5 MG TOTAL) BY MOUTH 3 (THREE) TIMES DAILY AS NEEDED FOR ANXIETY. 05/18/23   [name], MD  Continuous Glucose Sensor (FREESTYLE LIBRE 3 SENSOR) MISC as directed. 02/05/23   [provider]  diltiazem (CARDIZEM CD) 120 MG 24 hr capsule TAKE 1 CAPSULE BY MOUTH DAILY 03/16/23   Kathleene Hazel, MD  dofetilide (TIKOSYN) 250 MCG capsule Take 1 capsule (250 mcg total) by mouth 2 (two) times daily. 02/23/23   Fenton, Clint R, PA  guaiFENesin (MUCINEX) 600 MG 12 hr tablet Take 600 mg by mouth 2 (two) times daily.    [provider]  insulin aspart protamine - aspart (NOVOLOG 70/30 MIX) (70-30) 100 UNIT/ML FlexPen Inject 27 Units into the skin 2 (two) times daily with a meal. 12/13/22   Almon Hercules, MD  Insulin Pen Needle (PEN NEEDLES 3/16") 31G X 5 MM MISC 1 Pen by Does not apply route 2 (two) times daily before a meal. Before breakfast and dinner 12/13/22   Almon Hercules, MD  loratadine (CLARITIN) 10 MG tablet Take 10 mg by mouth at bedtime.    [provider]  mesalamine (LIALDA) 1.2 g EC tablet Take 1.2 g by mouth in the morning. 05/07/20   [provider]  metoprolol succinate (TOPROL-XL) 50 MG 24 hr tablet TAKE 1 TABLET BY MOUTH DAILY  WITH OR IMMEDIATELY  FOLLOWING A  MEAL 05/02/23   Kathleene Hazel, MD  Multiple Vitamins-Minerals (PRESERVISION AREDS 2 PO) Take 1 capsule by mouth 2 (two) times daily.    [provider]  nitrofurantoin, macrocrystal-monohydrate, (MACROBID) 100 MG capsule Take 100 mg by mouth at bedtime.    [provider]  oxybutynin (DITROPAN) 5 MG tablet Take 5 mg by mouth at bedtime. 02/24/23   [provider]  rosuvastatin (CRESTOR) 10 MG tablet TAKE ONE-HALF TABLET BY MOUTH  DAILY 05/20/23   Kathleene Hazel, MD  senna-docusate (SENOKOT-S) 8.6-50 MG tablet Take 1 tablet by mouth at bedtime. Patient taking differently: Take 1  tablet by mouth at bedtime as needed (constipation.). 01/03/23   Leslye Peer, MD  tamsulosin (FLOMAX) 0.4 MG CAPS capsule Take 1 capsule (0.4 mg total) by mouth 2 times daily at 12 noon and 4 pm. Patient taking differently: Take 0.4 mg by mouth in the morning and at bedtime. 03/31/18   Love, Evlyn Kanner, PA-C  Tiotropium Bromide-Olodaterol (STIOLTO RESPIMAT) 2.5-2.5 MCG/ACT AERS Inhale 2 puffs into the lungs daily. 03/09/23   Parrett, Virgel Bouquet, NP  umeclidinium bromide (INCRUSE ELLIPTA) 62.5 MCG/ACT AEPB Inhale 2 puffs into the lungs in the morning.    [provider]      Allergies    Effexor [venlafaxine], Lipitor [atorvastatin], and Other    Review of Systems   Review of Systems  Unable to perform ROS: Acuity of condition    Physical Exam Updated Vital Signs Pulse (!) 0   Resp (!) 0  Physical Exam Vitals and nursing note reviewed.  Constitutional:      General: He is not in acute distress.    Appearance: He is well-developed.     Comments: Unresponsive, CPR in progress  HENT:     Head: Normocephalic and atraumatic.  Eyes:     Comments: Pupils fixed and dilated  Cardiovascular:     Heart sounds: Normal heart sounds.     Comments: Pulseless, PEA then asystole on rhythm check Pulmonary:     Effort: No respiratory distress.     Comments: ET tube placed by EMS in the field Abdominal:     General: There is no distension.     Palpations: Abdomen is soft.     Tenderness: There is no abdominal tenderness.  Musculoskeletal:        General: No deformity.  Skin:    General: Skin is warm and dry.  Neurological:     Comments: Unresponsive, CPR in progress, pupils fixed and dilated bilaterally, no spontaneous ventilation attempt, no pulse     ED Results / Procedures / Treatments   Labs (all labs ordered are listed, but only abnormal results are displayed) Labs Reviewed - No data to display  EKG None  Radiology No results found.  Procedures Procedures     Medications Ordered in ED Medications - No data to display  ED Course/ Medical Decision Making/ A&P                                 Medical Decision Making   Medical Screen Complete  This patient presented to the ED with complaint of cardiac arrest.  This complaint involves an extensive number of treatment options. The initial differential diagnosis includes, but is not limited to, cardiac arrest  This presentation is: Acute, Chronic, Self-Limited, Previously Undiagnosed, Uncertain Prognosis, Complicated, Systemic Symptoms, and Threat to Life/Bodily Function  Patient with multiple significant comorbidities presents in cardiac arrest.  Patient with witnessed collapse at home.  Collapse seen by wife after the patient was straining to have a bowel movement.  As the patient was trying to ambulate from the restroom he collapsed.  The wife reports that initially he was able to talk within rapidly lost his ability to speak.  She reports that his face seem to be " weak".  First responders arrived shortly thereafter and initiated CPR.  CPR and resuscitation continued by EMS for more than 1 hour prior to arrival here at the ED.  On arrival to the ED the patient is pulseless.  He has no spontaneous respiration attempts.  His underlying rhythm is asystole.  Patient pronounced dead at 31.  Family medicine office contacted.  Patient's death certificate should be sent to Dr. Loletta Specter Chambliss / Dr. Darnelle Spangle.  Wife informed of outcome of case.  She understands progression of care.  All questions answered.  Chaplain at bedside.   Additional history obtained:  Additional history obtained from EMS External records from outside sources obtained and reviewed including prior ED visits and prior Inpatient records.   Problem List / ED Course:  Cardiac Arrest   Reevaluation:  After the interventions noted above, I reevaluated the patient and found that they have: stayed the  same   Disposition:  Patient expired.  Patient's Death Certificate to be sent to office of family medicine at [facility] to be signed by Dr. Deirdre Priest /Dr. Marisue Humble.  Wife understands outcome of case.  CRITICAL CARE Performed by: Wynetta Fines   Total critical care time: 30 minutes  Critical care time was exclusive of separately billable procedures and treating other patients.  Critical care was necessary to treat or prevent imminent or life-threatening deterioration.  Critical care was time spent personally by me on the following activities: development of treatment plan with patient and/or surrogate as well as nursing, discussions with consultants, evaluation of patient's response to treatment, examination of patient, obtaining history from patient or surrogate, ordering and performing treatments and interventions, ordering and review of laboratory studies, ordering and review of radiographic studies, pulse oximetry and re-evaluation of patient's condition.         Final Clinical Impression(s) / ED Diagnoses Final diagnoses:  Cardiac arrest [facility])    Rx / DC Orders ED Discharge Orders     None         Wynetta Fines, MD June 15, 2023 1230

## 2023-05-29 ENCOUNTER — Other Ambulatory Visit: Payer: Self-pay | Admitting: Cardiovascular Disease

## 2023-05-29 DIAGNOSIS — I480 Paroxysmal atrial fibrillation: Secondary | ICD-10-CM

## 2023-05-29 DEATH — deceased

## 2023-06-07 ENCOUNTER — Ambulatory Visit (HOSPITAL_COMMUNITY): Admit: 2023-06-07 | Payer: Medicare Other | Admitting: Gastroenterology

## 2023-06-07 SURGERY — COLONOSCOPY WITH PROPOFOL
Anesthesia: Monitor Anesthesia Care

## 2023-06-14 ENCOUNTER — Ambulatory Visit: Payer: Medicare Other | Admitting: Orthopedic Surgery

## 2023-06-14 ENCOUNTER — Institutional Professional Consult (permissible substitution): Payer: Medicare Other | Admitting: Cardiology

## 2023-06-24 ENCOUNTER — Ambulatory Visit: Payer: Self-pay | Admitting: Student

## 2023-07-22 ENCOUNTER — Ambulatory Visit: Payer: Medicare Other | Admitting: Cardiovascular Disease

## 2023-10-12 ENCOUNTER — Other Ambulatory Visit: Payer: Medicare Other

## 2023-10-17 ENCOUNTER — Ambulatory Visit: Payer: Medicare Other | Admitting: Internal Medicine
